# Patient Record
Sex: Male | Born: 1950 | Race: White | Hispanic: No | Marital: Married | Smoking: Former smoker
Health system: Southern US, Community
[De-identification: ages and names within clinical notes are randomized; demographics above are authoritative.]

## PROBLEM LIST (undated history)

## (undated) DIAGNOSIS — H269 Unspecified cataract: Secondary | ICD-10-CM

## (undated) DIAGNOSIS — Z8042 Family history of malignant neoplasm of prostate: Secondary | ICD-10-CM

## (undated) DIAGNOSIS — E119 Type 2 diabetes mellitus without complications: Secondary | ICD-10-CM

## (undated) DIAGNOSIS — I340 Nonrheumatic mitral (valve) insufficiency: Secondary | ICD-10-CM

## (undated) DIAGNOSIS — Z86010 Personal history of colonic polyps: Secondary | ICD-10-CM

## (undated) DIAGNOSIS — I739 Peripheral vascular disease, unspecified: Secondary | ICD-10-CM

## (undated) DIAGNOSIS — C61 Malignant neoplasm of prostate: Secondary | ICD-10-CM

## (undated) DIAGNOSIS — E785 Hyperlipidemia, unspecified: Secondary | ICD-10-CM

## (undated) DIAGNOSIS — I639 Cerebral infarction, unspecified: Secondary | ICD-10-CM

## (undated) DIAGNOSIS — G4730 Sleep apnea, unspecified: Secondary | ICD-10-CM

## (undated) DIAGNOSIS — Z9989 Dependence on other enabling machines and devices: Secondary | ICD-10-CM

## (undated) DIAGNOSIS — G4733 Obstructive sleep apnea (adult) (pediatric): Secondary | ICD-10-CM

## (undated) DIAGNOSIS — I10 Essential (primary) hypertension: Secondary | ICD-10-CM

## (undated) DIAGNOSIS — I2510 Atherosclerotic heart disease of native coronary artery without angina pectoris: Secondary | ICD-10-CM

## (undated) HISTORY — DX: Family history of malignant neoplasm of prostate: Z80.42

## (undated) HISTORY — DX: Hyperlipidemia, unspecified: E78.5

## (undated) HISTORY — PX: PROSTATE BIOPSY: SHX241

## (undated) HISTORY — DX: Nonrheumatic mitral (valve) insufficiency: I34.0

## (undated) HISTORY — PX: BACK SURGERY: SHX140

## (undated) HISTORY — PX: CARDIAC CATHETERIZATION: SHX172

## (undated) HISTORY — DX: Essential (primary) hypertension: I10

## (undated) HISTORY — DX: Unspecified cataract: H26.9

## (undated) HISTORY — DX: Cerebral infarction, unspecified: I63.9

## (undated) HISTORY — DX: Malignant neoplasm of prostate: C61

## (undated) HISTORY — DX: Sleep apnea, unspecified: G47.30

## (undated) HISTORY — DX: Personal history of colonic polyps: Z86.010

---

## 1998-08-13 DIAGNOSIS — I10 Essential (primary) hypertension: Secondary | ICD-10-CM

## 1998-08-13 HISTORY — DX: Essential (primary) hypertension: I10

## 2006-01-04 ENCOUNTER — Ambulatory Visit: Payer: Self-pay | Admitting: Family Medicine

## 2006-02-04 ENCOUNTER — Ambulatory Visit: Payer: Self-pay | Admitting: Family Medicine

## 2006-02-04 LAB — CONVERTED CEMR LAB
Blood Glucose, Fasting: 293 mg/dL
Hgb A1c MFr Bld: 10.7 %
Microalbumin U total vol: 52.2 mg/L
PSA: 17.49 ng/mL
TSH: 1.47 microintl units/mL

## 2006-02-07 ENCOUNTER — Ambulatory Visit: Payer: Self-pay | Admitting: Family Medicine

## 2006-03-29 ENCOUNTER — Ambulatory Visit: Payer: Self-pay | Admitting: Family Medicine

## 2006-04-30 ENCOUNTER — Ambulatory Visit: Payer: Self-pay | Admitting: Family Medicine

## 2006-04-30 ENCOUNTER — Ambulatory Visit: Payer: Self-pay | Admitting: Urology

## 2006-05-01 ENCOUNTER — Ambulatory Visit: Payer: Self-pay | Admitting: Urology

## 2006-06-25 ENCOUNTER — Ambulatory Visit: Payer: Self-pay | Admitting: Urology

## 2006-06-25 ENCOUNTER — Other Ambulatory Visit: Payer: Self-pay

## 2006-07-20 HISTORY — PX: OTHER SURGICAL HISTORY: SHX169

## 2006-09-19 ENCOUNTER — Ambulatory Visit: Payer: Self-pay | Admitting: Family Medicine

## 2006-09-19 LAB — CONVERTED CEMR LAB: Hgb A1c MFr Bld: 8.2 % — ABNORMAL HIGH (ref 4.6–6.0)

## 2006-09-20 ENCOUNTER — Encounter: Payer: Self-pay | Admitting: Family Medicine

## 2006-09-20 LAB — CONVERTED CEMR LAB: Hgb A1c MFr Bld: 8.2 %

## 2006-09-23 ENCOUNTER — Ambulatory Visit: Payer: Self-pay | Admitting: Family Medicine

## 2006-09-30 ENCOUNTER — Ambulatory Visit: Payer: Self-pay | Admitting: Family Medicine

## 2006-10-08 ENCOUNTER — Ambulatory Visit: Payer: Self-pay | Admitting: Family Medicine

## 2006-10-12 ENCOUNTER — Ambulatory Visit: Payer: Self-pay | Admitting: Family Medicine

## 2006-11-07 ENCOUNTER — Ambulatory Visit: Payer: Self-pay | Admitting: Family Medicine

## 2006-11-12 ENCOUNTER — Ambulatory Visit: Payer: Self-pay | Admitting: Family Medicine

## 2006-12-05 ENCOUNTER — Ambulatory Visit: Payer: Self-pay | Admitting: Family Medicine

## 2006-12-27 ENCOUNTER — Telehealth (INDEPENDENT_AMBULATORY_CARE_PROVIDER_SITE_OTHER): Payer: Self-pay | Admitting: *Deleted

## 2007-02-06 ENCOUNTER — Ambulatory Visit: Payer: Self-pay | Admitting: Urology

## 2007-03-13 ENCOUNTER — Encounter (HOSPITAL_COMMUNITY): Admission: RE | Admit: 2007-03-13 | Discharge: 2007-05-07 | Payer: Self-pay | Admitting: Urology

## 2007-04-29 ENCOUNTER — Encounter: Payer: Self-pay | Admitting: Family Medicine

## 2007-05-19 ENCOUNTER — Encounter: Payer: Self-pay | Admitting: Family Medicine

## 2007-07-14 ENCOUNTER — Encounter: Payer: Self-pay | Admitting: Family Medicine

## 2007-07-17 ENCOUNTER — Encounter: Payer: Self-pay | Admitting: Family Medicine

## 2007-09-04 ENCOUNTER — Encounter: Payer: Self-pay | Admitting: Family Medicine

## 2007-09-16 ENCOUNTER — Encounter: Payer: Self-pay | Admitting: Family Medicine

## 2007-09-16 DIAGNOSIS — C61 Malignant neoplasm of prostate: Secondary | ICD-10-CM | POA: Insufficient documentation

## 2007-09-16 DIAGNOSIS — F17200 Nicotine dependence, unspecified, uncomplicated: Secondary | ICD-10-CM | POA: Insufficient documentation

## 2007-10-22 ENCOUNTER — Encounter: Payer: Self-pay | Admitting: Family Medicine

## 2007-12-15 ENCOUNTER — Encounter: Payer: Self-pay | Admitting: Family Medicine

## 2008-04-13 ENCOUNTER — Encounter: Payer: Self-pay | Admitting: Family Medicine

## 2008-05-28 ENCOUNTER — Encounter: Payer: Self-pay | Admitting: Family Medicine

## 2008-06-18 ENCOUNTER — Encounter: Payer: Self-pay | Admitting: Family Medicine

## 2008-06-21 ENCOUNTER — Encounter: Payer: Self-pay | Admitting: Family Medicine

## 2008-06-21 HISTORY — PX: OTHER SURGICAL HISTORY: SHX169

## 2008-06-29 ENCOUNTER — Encounter: Payer: Self-pay | Admitting: Family Medicine

## 2008-06-29 DIAGNOSIS — I10 Essential (primary) hypertension: Secondary | ICD-10-CM | POA: Insufficient documentation

## 2008-06-29 DIAGNOSIS — Z87891 Personal history of nicotine dependence: Secondary | ICD-10-CM | POA: Insufficient documentation

## 2008-08-10 ENCOUNTER — Ambulatory Visit: Payer: Self-pay | Admitting: Urology

## 2008-08-17 ENCOUNTER — Ambulatory Visit: Payer: Self-pay | Admitting: Urology

## 2008-09-01 ENCOUNTER — Encounter: Payer: Self-pay | Admitting: Family Medicine

## 2008-12-28 ENCOUNTER — Encounter: Payer: Self-pay | Admitting: Family Medicine

## 2009-01-17 ENCOUNTER — Encounter: Payer: Self-pay | Admitting: Family Medicine

## 2009-05-12 ENCOUNTER — Ambulatory Visit: Payer: Self-pay | Admitting: Family Medicine

## 2009-05-12 DIAGNOSIS — E669 Obesity, unspecified: Secondary | ICD-10-CM | POA: Insufficient documentation

## 2009-06-15 ENCOUNTER — Encounter: Payer: Self-pay | Admitting: Family Medicine

## 2009-06-28 ENCOUNTER — Encounter: Payer: Self-pay | Admitting: Family Medicine

## 2009-06-29 ENCOUNTER — Encounter: Payer: Self-pay | Admitting: Family Medicine

## 2009-07-18 ENCOUNTER — Encounter: Payer: Self-pay | Admitting: Family Medicine

## 2009-07-18 DIAGNOSIS — I679 Cerebrovascular disease, unspecified: Secondary | ICD-10-CM | POA: Insufficient documentation

## 2009-07-20 ENCOUNTER — Encounter: Admission: RE | Admit: 2009-07-20 | Discharge: 2009-07-20 | Payer: Self-pay | Admitting: Neurology

## 2009-08-10 ENCOUNTER — Encounter (HOSPITAL_COMMUNITY): Admission: RE | Admit: 2009-08-10 | Discharge: 2009-08-12 | Payer: Self-pay | Admitting: Urology

## 2009-08-26 ENCOUNTER — Encounter: Payer: Self-pay | Admitting: Cardiovascular Disease

## 2009-08-26 ENCOUNTER — Encounter: Payer: Self-pay | Admitting: Family Medicine

## 2009-09-12 ENCOUNTER — Encounter: Payer: Self-pay | Admitting: Cardiovascular Disease

## 2009-09-13 ENCOUNTER — Encounter: Payer: Self-pay | Admitting: Family Medicine

## 2009-09-13 HISTORY — PX: OTHER SURGICAL HISTORY: SHX169

## 2009-09-21 ENCOUNTER — Encounter: Payer: Self-pay | Admitting: Cardiovascular Disease

## 2009-09-21 ENCOUNTER — Encounter: Payer: Self-pay | Admitting: Family Medicine

## 2009-09-26 ENCOUNTER — Encounter: Payer: Self-pay | Admitting: Cardiovascular Disease

## 2009-09-30 ENCOUNTER — Encounter: Admission: RE | Admit: 2009-09-30 | Discharge: 2009-09-30 | Payer: Self-pay | Admitting: Orthopedic Surgery

## 2009-10-03 ENCOUNTER — Ambulatory Visit: Payer: Self-pay | Admitting: Cardiovascular Disease

## 2009-10-03 ENCOUNTER — Encounter: Payer: Self-pay | Admitting: Family Medicine

## 2009-10-03 DIAGNOSIS — I739 Peripheral vascular disease, unspecified: Secondary | ICD-10-CM | POA: Insufficient documentation

## 2009-10-03 DIAGNOSIS — M1990 Unspecified osteoarthritis, unspecified site: Secondary | ICD-10-CM | POA: Insufficient documentation

## 2009-10-10 LAB — CONVERTED CEMR LAB
ALT: 16 units/L (ref 0–53)
AST: 11 units/L (ref 0–37)
Albumin: 4.3 g/dL (ref 3.5–5.2)
Alkaline Phosphatase: 74 units/L (ref 39–117)
BUN: 20 mg/dL (ref 6–23)
CO2: 25 meq/L (ref 19–32)
Calcium: 8.5 mg/dL (ref 8.4–10.5)
Chloride: 99 meq/L (ref 96–112)
Cholesterol: 160 mg/dL (ref 0–200)
Creatinine, Ser: 1 mg/dL (ref 0.40–1.50)
Glucose, Bld: 98 mg/dL (ref 70–99)
HDL: 34 mg/dL — ABNORMAL LOW (ref >39)
LDL Cholesterol: 100 mg/dL — ABNORMAL HIGH (ref 0–99)
Potassium: 4.2 meq/L (ref 3.5–5.3)
Sodium: 137 meq/L (ref 135–145)
Total Bilirubin: 0.9 mg/dL (ref 0.3–1.2)
Total CHOL/HDL Ratio: 4.7
Total Protein: 6.6 g/dL (ref 6.0–8.3)
Triglycerides: 129 mg/dL (ref <150)
VLDL: 26 mg/dL (ref 0–40)

## 2009-10-26 ENCOUNTER — Encounter: Payer: Self-pay | Admitting: Family Medicine

## 2009-12-14 ENCOUNTER — Encounter: Payer: Self-pay | Admitting: Family Medicine

## 2010-02-06 ENCOUNTER — Encounter: Payer: Self-pay | Admitting: Family Medicine

## 2010-03-15 ENCOUNTER — Encounter (INDEPENDENT_AMBULATORY_CARE_PROVIDER_SITE_OTHER): Payer: Self-pay | Admitting: *Deleted

## 2010-04-12 ENCOUNTER — Encounter: Payer: Self-pay | Admitting: Family Medicine

## 2010-04-18 ENCOUNTER — Encounter: Payer: Self-pay | Admitting: Family Medicine

## 2010-05-25 ENCOUNTER — Ambulatory Visit: Payer: Self-pay | Admitting: Urology

## 2010-06-01 ENCOUNTER — Encounter: Payer: Self-pay | Admitting: Family Medicine

## 2010-06-16 ENCOUNTER — Encounter: Payer: Self-pay | Admitting: Family Medicine

## 2010-07-05 ENCOUNTER — Encounter: Payer: Self-pay | Admitting: Family Medicine

## 2010-07-27 ENCOUNTER — Encounter: Payer: Self-pay | Admitting: Family Medicine

## 2010-08-13 LAB — HM DIABETES EYE EXAM

## 2010-08-30 ENCOUNTER — Encounter: Payer: Self-pay | Admitting: Family Medicine

## 2010-09-12 NOTE — Letter (Signed)
Summary: Medical Record Release  Medical Record Release   Imported By: Harlon Flor 09/28/2009 14:19:53  _____________________________________________________________________  External Attachment:    Type:   Image     Comment:   External Document

## 2010-09-12 NOTE — Letter (Signed)
Summary: Dr.Timothy Gollan,[facility],Note  Dr.Timothy Gollan,[facility],Note   Imported By: Beau Fanny 08/29/2009 15:53:28  _____________________________________________________________________  External Attachment:    Type:   Image     Comment:   External Document

## 2010-09-12 NOTE — Letter (Signed)
Summary: Dr.[facility],Note  Dr.[facility] Endocrinology,Note   Imported By: Beau Fanny 12/20/2009 15:19:24  _____________________________________________________________________  External Attachment:    Type:   Image     Comment:   External Document

## 2010-09-12 NOTE — Assessment & Plan Note (Signed)
Summary: NP6/AMD   Visit Type:  New Patient Primary Provider:  Shaune Leeks MD  CC:  abnormal stress test and no complaints.  History of Present Illness: [name] is a very pleasant 60 year old gentleman, patient of Dr. Hetty Ely and Dr. Achilles Dunk, history of prostate cancer with resection, diabetes, smoking history and continues to smoke 3-4 cigarettes per day, DJD, hypertension and obesity who presents for followup after his stress test. Patient has moderate calcifications in his aorta and iliac vessels.  Overall he has been doing well with no complaints on the Crestor and lisinopril HCT.he exercises at the gym 2 times per week on a treadmill with physical therapy and place racquetball 2x per week. He denies any significant symptoms of chest pain with exertion. He does have some shortness of breath. He has lost 6 pounds since his last clinic visit one month ago.  Stress test was a Bruce protocol, exercising for 8 minutes, achieved 9 mets at peak heart rate 144 which was 89% of predicted. Peak blood pressure 183/87. He was very fatigued with exertion. There was mild ischemia in the mid anterior to apical inferior region, ejection fraction 50%, no EKG changes, overall is felt to be a low-risk scan.   EKG during the stress test showed normal sinus rhythm with left axis deviation, no significant ST or T wave changes. Rate of 89 beats per minute  Per his report, he had a carotid ultrasound by Dr. love in [place] that showed no significant disease.  Preventive Screening-Counseling & Management  Alcohol-Tobacco     Alcohol drinks/day: 0     Smoking Status: current     Packs/Day: 3 cig  Caffeine-Diet-Exercise     Caffeine use/day: 5 sodas     Does Patient Exercise: yes  Current Problems (verified): 1)  Degenerative Joint Disease  (ICD-715.90) 2)  Exogenous Obesity  (ICD-278.00) 3)  Numbness, L Hand  (ICD-782.0) 4)  Prostate Specific Antigen, Elevated  (ICD-790.93) 5)  Neoplasm,  Malignant, Prostate  (ICD-185) 6)  Nicotine Addiction  (ICD-305.1) 7)  Hypercholesterolemia  (ICD-272.0) 8)  Hypertension  (ICD-401.9) 9)  Diabetes Mellitus, Type II  (ICD-250.00)  Current Medications (verified): 1)  Ascensia Contour Test  Strp (Glucose Blood) .... Use Daily As Ordered   Code: 250.00 2)  Ulticare Short Pen Needles 31g X 8 Mm Misc (Insulin Pen Needle) .... Use As Directed Code: 250.00 3)  Actos 30 Mg Tabs (Pioglitazone Hcl) .... Take One By Mouth Daily 4)  Lantus 100 Unit/ml Soln (Insulin Glargine) .... Inject 130 Units Subcutaneously Daily 5)  Crestor 5 Mg Tabs (Rosuvastatin Calcium) .... Take One Tablet By Mouth Daily. 6)  Aspirin 81 Mg Tbec (Aspirin) .... Take One Tablet By Mouth Daily 7)  Vesicare 5 Mg Tabs (Solifenacin Succinate) .Marland Kitchen.. 1 By Mouth Once Daily 8)  Lisinopril-Hydrochlorothiazide 20-12.5 Mg Tabs (Lisinopril-Hydrochlorothiazide) .Marland Kitchen.. 1 By Mouth Once Daily  Allergies (verified): 1)  ! * Statins 2)  ! * Bp Medications  Past History:  Past Medical History: Last updated: 09/16/2007 Diabetes mellitus, type II:(2000) Hypertension:(2000)  Past Surgical History: Last updated: 09/13/2009 PROSTATE BIOPSY:(04/03/2006) HIFU-- HIGH INTENSE FOCUSED ULTRASOUND DONE IN--------- BY DR COPE:(07/20/2006) PROSTATE BIOPSY:(02/25/2007) CYSTOSCOPY PROSTATIC STONE O/W NML (DR COPE) (06/21/2008) ETT MYOVIEW LOW RISK  EF 49% (09/13/2009)  Family History: Last updated: 09/16/2007 Father::ALIVE AT 87 YOA-+HTN, +CAD, CABG WITH MI AROUND 1997  Mother: ALIVE AT 87 YOA +DM; DIAGNOSED AT 33 YOA ;+CAD; +HTN; CABG WITH MI IN 2005 Siblings: 1 BROTHER OLDER 72 YOA :MI/PTCA  1 SISTER OLDER 58 YOA// 1 SISTER OLDER 56 YOA +DM CV: +FATHER/ MOTHER/ BROTHER/ MGF DECEASED AT 68 YOA OF MI HBP: + FATHER/ +MOTHER/ + SELF DM: + MOTHER/ + SISTER/ + SELF GOUT/ARTHRITIS:  PROSTATE CANCER: + PGF DECEASED AT 88 YOA// + SELF BREAST/OVARIAN/UTERINE CANCER: NEGATIVE COLON CANCER:  NEGATIVE DEPRESSION: NEGATIVE ETOH/DRUG ABUSE: NEGATIVE OTHER: NEGATIVE STROKE  Social History: Last updated: 09/16/2007 Marital Status: Married LIVES WITH WIFE Children: 3 CHILDREN 1 BOY // 2 GIRLS 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  Risk Factors: Alcohol Use: 0 (10/03/2009) Caffeine Use: 5 sodas (10/03/2009) Exercise: yes (10/03/2009)  Risk Factors: Smoking Status: current (10/03/2009) Packs/Day: 3 cig (10/03/2009)  Social History: Alcohol drinks/day:  0 Packs/Day:  3 cig Caffeine use/day:  5 sodas Does Patient Exercise:  yes  Review of Systems  The patient denies anorexia, fever, weight loss, weight gain, vision loss, decreased hearing, hoarseness, chest pain, syncope, dyspnea on exertion, peripheral edema, prolonged cough, headaches, hemoptysis, abdominal pain, melena, hematochezia, severe indigestion/heartburn, hematuria, incontinence, genital sores, muscle weakness, suspicious skin lesions, transient blindness, difficulty walking, depression, unusual weight change, abnormal bleeding, enlarged lymph nodes, angioedema, breast masses, and testicular masses.    Vital Signs:  Patient profile:   60 year old male Height:      69.5 inches Weight:      293.50 pounds BMI:     42.88 Pulse rate:   100 / minute Pulse rhythm:   regular BP sitting:   110 / 72  (right arm) Cuff size:   large  Vitals Entered By: Mercer Pod (October 03, 2009 10:41 AM)  Physical Exam  General:  well-appearing middle-aged gentleman in no apparent distress, alert and oriented x3, HEENT exam is benign, oropharynx is clear, neck is supple with no JVP or carotid bruits, heart sounds are regular with no S1-S2 and no murmurs appreciated, lungs are clear to auscultation with no wheezes or rales, abdominal exam is notable for moderate obesity, no significant lower extremity edema, neurologic exam is grossly nonfocal, skin is warm and dry. Pulses are equal and symmetrical in  his upper and lower extremities.   Impression & Recommendations:  Problem # 1:  DYSPNEA (ICD-786.05) I believe his shortness of breath that he has on exertion is due to his obesity and some mild deconditioning.his stress test only showed mild abnormalities and was low risk. I have suggested to him that if he has worsening chest pressure or symptoms, that he call me for further evaluation. Otherwise I would encourage him to continue to work out at the gym as frequently as possible and to continue the trend of his weight in a downward fashion.He is down 6 pounds over the past month..  His updated medication list for this problem includes:    Aspirin 81 Mg Tbec (Aspirin) .Marland Kitchen... Take one tablet by mouth daily    Lisinopril-hydrochlorothiazide 20-12.5 Mg Tabs (Lisinopril-hydrochlorothiazide) .Marland Kitchen... 1 by mouth once daily  Problem # 2:  HYPERTENSION (ICD-401.9) blood pressure is well controlled today and he has had no problems on lisinopril HCT. We'll continue this medication as he seemed to tolerate well. The trace lower extremity edema seems to have resolved.  His updated medication list for this problem includes:    Aspirin 81 Mg Tbec (Aspirin) .Marland Kitchen... Take one tablet by mouth daily    Lisinopril-hydrochlorothiazide 20-12.5 Mg Tabs (Lisinopril-hydrochlorothiazide) .Marland Kitchen... 1 by mouth once daily  Problem # 3:  HYPERCHOLESTEROLEMIA (ICD-272.0) We'll check his cholesterol today as he has been on Crestor for  2-3 months. We'll forward this to Dr. Hetty Ely is as it becomes available.  His updated medication list for this problem includes:    Crestor 5 Mg Tabs (Rosuvastatin calcium) .Marland Kitchen... Take one tablet by mouth daily.  Orders: T-Comprehensive Metabolic Panel [phone]) T-Lipid Profile ([id number])  Problem # 4:  PVD (ICD-443.9) PVD noted on CT scan in his aorta and iliacs. We have recommended aggressive medical management of his cholesterol and diabetes. He states his diabetes is relatively well  controlled.  Patient Instructions: 1)  Your physician recommends that you schedule a follow-up appointment in: 1 year 2)  Your physician recommends that you continue on your current medications as directed. Please refer to the Current Medication list given to you today.

## 2010-09-12 NOTE — Letter (Signed)
Summary: [facility] / EVAL PROSTATE CANCER / DR. Arlys John COPE  [facility] / EVAL PROSTATE CANCER / DR. Arlys John COPE   Imported By: Carin Primrose 09/02/2008 14:38:03  _____________________________________________________________________  External Attachment:    Type:   Image     Comment:   External Document

## 2010-09-12 NOTE — Letter (Signed)
Summary: [facility]  [facility]   Imported By: Lanelle Bal 06/12/2010 11:16:28  _____________________________________________________________________  External Attachment:    Type:   Image     Comment:   External Document

## 2010-09-12 NOTE — Letter (Signed)
Summary: Phillip Clements,GI,Note   Imported By: Beau Fanny 10/20/2009 09:35:07  _____________________________________________________________________  External Attachment:    Type:   Image     Comment:   External Document

## 2010-09-12 NOTE — Letter (Signed)
Summary: [facility] Endocrinology  [facility] Endocrinology   Imported By: Lanelle Bal 04/24/2010 11:19:24  _____________________________________________________________________  External Attachment:    Type:   Image     Comment:   External Document  Appended Document: [facility] Endocrinology    Clinical Lists Changes  Observations: Added new observation of PAST MED HX: Diabetes mellitus, type II:(2000) Hypertension:(2000) HLD- lipids and DM per [facility] endo (04/24/2010 22:20)         Past History:  Past Medical History: Diabetes mellitus, type II:(2000) Hypertension:(2000) HLD- lipids and DM per [facility] endo

## 2010-09-12 NOTE — Consult Note (Signed)
Summary: Dr.Jakel Love,[facility]Note  Dr.Clent Love,[facility]Note   Imported By: Beau Fanny 10/27/2009 11:33:03  _____________________________________________________________________  External Attachment:    Type:   Image     Comment:   External Document

## 2010-09-12 NOTE — Letter (Signed)
Summary: Medical Record Release  Medical Record Release   Imported By: Harlon Flor 10/10/2009 08:26:37  _____________________________________________________________________  External Attachment:    Type:   Image     Comment:   External Document

## 2010-09-12 NOTE — Letter (Signed)
Summary: Nadara Eaton letter  [place] [facility]  [address] [place], [place] [address]   Phone: [phone]  Fax: [phone]       03/15/2010 MRN: [id number]  SHADRACK BRUMMITT [address] [place], [place]  [address]  Dear Mr. [facility] [place], and [place] announce the retirement of Arta Silence, M.D., from full-time practice at the [facility] office effective February 09, 2010 and his plans of returning part-time.  It is important to Dr. Hetty Ely and to our practice that you understand that [facility] has seven physicians in our office for your health care needs.  We will continue to offer the same exceptional care that you have today.    Dr. Hetty Ely has spoken to many of you about his plans for retirement and returning part-time in the fall.   We will continue to work with you through the transition to schedule appointments for you in the office and meet the high standards that [place] is committed to.   Again, it is with great pleasure that we share the news that Dr. Hetty Ely will return to [facility] at [facility] in October of 2011 with a reduced schedule.    If you have any questions, or would like to request an appointment with one of our physicians, please call us at [phone] and press the option for Scheduling an appointment.  We take pleasure in providing you with excellent patient care and look forward to seeing you at your next office visit.  Our [facility] Physicians are:  Tillman Abide, M.D. Laurita Quint, M.D. Roxy Manns, M.D. Kerby Nora, M.D. Hannah Beat, M.D. Ruthe Mannan, M.D. We proudly welcomed Raechel Ache, M.D. and Eustaquio Boyden, M.D. to the practice in July/August 2011.  Sincerely,  [facility]

## 2010-09-12 NOTE — Letter (Signed)
Summary: Dr.Brian Cope,[facility],Note  Dr.Brian Cope,[facility],Note   Imported By: Beau Fanny 02/06/2010 11:16:11  _____________________________________________________________________  External Attachment:    Type:   Image     Comment:   External Document

## 2010-09-12 NOTE — Letter (Signed)
Summary: Dr.Luisdavid Love,[facility],Note  Dr.Ilay Love,[facility],Note   Imported By: Beau Fanny 06/29/2009 09:19:52  _____________________________________________________________________  External Attachment:    Type:   Image     Comment:   External Document

## 2010-09-12 NOTE — Consult Note (Signed)
Summary: [facility]  [facility]   Imported By: Lanelle Bal 06/26/2010 11:10:15  _____________________________________________________________________  External Attachment:    Type:   Image     Comment:   External Document

## 2010-09-12 NOTE — Letter (Signed)
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 Sandria Manly

## 2010-09-12 NOTE — Progress Notes (Signed)
Summary: PHI  PHI   Imported By: Harlon Flor 10/05/2009 10:44:06  _____________________________________________________________________  External Attachment:    Type:   Image     Comment:   External Document

## 2010-09-14 NOTE — Letter (Signed)
Summary: [facility]  [facility]   Imported By: Maryln Gottron 07/24/2010 15:04:54  _____________________________________________________________________  External Attachment:    Type:   Image     Comment:   External Document

## 2010-09-14 NOTE — Letter (Signed)
Summary: [facility]  [facility]   Imported By: Lanelle Bal 08/08/2010 13:15:29  _____________________________________________________________________  External Attachment:    Type:   Image     Comment:   External Document

## 2010-09-14 NOTE — Letter (Signed)
Summary: Dr.Brian Cope,[facility],Note  Dr.Brian Cope,[facility],Note   Imported By: Beau Fanny 08/31/2010 15:33:39  _____________________________________________________________________  External Attachment:    Type:   Image     Comment:   External Document

## 2010-09-25 ENCOUNTER — Other Ambulatory Visit: Payer: Self-pay | Admitting: Neurology

## 2010-09-25 DIAGNOSIS — M5412 Radiculopathy, cervical region: Secondary | ICD-10-CM

## 2010-09-29 ENCOUNTER — Ambulatory Visit
Admission: RE | Admit: 2010-09-29 | Discharge: 2010-09-29 | Disposition: A | Discharge status: Home / Self Care | Payer: BC Managed Care – PPO | Source: Ambulatory Visit | Attending: Neurology | Admitting: Neurology

## 2010-09-29 DIAGNOSIS — M5412 Radiculopathy, cervical region: Secondary | ICD-10-CM

## 2011-01-09 ENCOUNTER — Ambulatory Visit: Payer: BC Managed Care – PPO | Attending: Neurology | Admitting: Physical Therapy

## 2011-01-09 DIAGNOSIS — M25549 Pain in joints of unspecified hand: Secondary | ICD-10-CM | POA: Insufficient documentation

## 2011-01-09 DIAGNOSIS — M2569 Stiffness of other specified joint, not elsewhere classified: Secondary | ICD-10-CM | POA: Insufficient documentation

## 2011-01-09 DIAGNOSIS — IMO0001 Reserved for inherently not codable concepts without codable children: Secondary | ICD-10-CM | POA: Insufficient documentation

## 2011-01-09 DIAGNOSIS — R209 Unspecified disturbances of skin sensation: Secondary | ICD-10-CM | POA: Insufficient documentation

## 2011-01-18 ENCOUNTER — Ambulatory Visit: Payer: BC Managed Care – PPO | Admitting: Physical Therapy

## 2011-01-19 ENCOUNTER — Ambulatory Visit: Payer: BC Managed Care – PPO | Admitting: Physical Therapy

## 2011-02-07 ENCOUNTER — Ambulatory Visit: Payer: BC Managed Care – PPO | Attending: Neurology | Admitting: Physical Therapy

## 2011-02-07 DIAGNOSIS — R209 Unspecified disturbances of skin sensation: Secondary | ICD-10-CM | POA: Insufficient documentation

## 2011-02-07 DIAGNOSIS — M25549 Pain in joints of unspecified hand: Secondary | ICD-10-CM | POA: Insufficient documentation

## 2011-02-07 DIAGNOSIS — IMO0001 Reserved for inherently not codable concepts without codable children: Secondary | ICD-10-CM | POA: Insufficient documentation

## 2011-02-07 DIAGNOSIS — M2569 Stiffness of other specified joint, not elsewhere classified: Secondary | ICD-10-CM | POA: Insufficient documentation

## 2011-02-27 ENCOUNTER — Encounter: Payer: Self-pay | Admitting: Cardiovascular Disease

## 2011-04-03 ENCOUNTER — Ambulatory Visit: Payer: Self-pay | Admitting: Otolaryngology

## 2011-04-25 ENCOUNTER — Ambulatory Visit: Payer: Self-pay | Admitting: Ophthalmology

## 2011-07-30 DIAGNOSIS — H524 Presbyopia: Secondary | ICD-10-CM | POA: Insufficient documentation

## 2011-07-30 DIAGNOSIS — M1990 Unspecified osteoarthritis, unspecified site: Secondary | ICD-10-CM | POA: Insufficient documentation

## 2011-07-30 DIAGNOSIS — R030 Elevated blood-pressure reading, without diagnosis of hypertension: Secondary | ICD-10-CM | POA: Insufficient documentation

## 2011-07-30 DIAGNOSIS — E7800 Pure hypercholesterolemia, unspecified: Secondary | ICD-10-CM | POA: Insufficient documentation

## 2011-07-30 DIAGNOSIS — I639 Cerebral infarction, unspecified: Secondary | ICD-10-CM | POA: Insufficient documentation

## 2011-08-24 ENCOUNTER — Other Ambulatory Visit: Payer: Self-pay | Admitting: *Deleted

## 2011-08-24 DIAGNOSIS — E119 Type 2 diabetes mellitus without complications: Secondary | ICD-10-CM

## 2011-08-24 NOTE — Telephone Encounter (Signed)
Patient has not been seen in this clinic in quite some time.  ? 1 RF ?

## 2011-08-26 MED ORDER — INSULIN PEN NEEDLE 31G X 8 MM MISC: Status: DC

## 2011-08-26 NOTE — Telephone Encounter (Signed)
Please call in 30 day supply, have pt schedule OV.  Thanks.  Will need labs ahead of time.

## 2011-08-27 ENCOUNTER — Other Ambulatory Visit: Payer: Self-pay | Admitting: *Deleted

## 2011-08-27 MED ORDER — INSULIN PEN NEEDLE 31G X 8 MM MISC: Status: DC

## 2011-08-27 NOTE — Telephone Encounter (Signed)
Refill sent electronically

## 2011-11-08 ENCOUNTER — Encounter: Payer: Self-pay | Admitting: Family Medicine

## 2011-12-03 DIAGNOSIS — R202 Paresthesia of skin: Secondary | ICD-10-CM | POA: Insufficient documentation

## 2012-04-11 ENCOUNTER — Other Ambulatory Visit: Payer: Self-pay | Admitting: *Deleted

## 2012-04-11 NOTE — Telephone Encounter (Signed)
Faxed refill request.  Please see last note on RF.  No upcoming appts scheduled.

## 2012-04-14 NOTE — Telephone Encounter (Signed)
Deny this.  He needs to schedule OV with labs ahead of time.

## 2012-04-15 NOTE — Telephone Encounter (Signed)
Patient notified by telephone. Was advised by patient that he will have to call back and schedule these appointments.

## 2012-04-16 ENCOUNTER — Other Ambulatory Visit: Payer: Self-pay | Admitting: *Deleted

## 2012-05-13 DIAGNOSIS — N319 Neuromuscular dysfunction of bladder, unspecified: Secondary | ICD-10-CM | POA: Insufficient documentation

## 2012-05-13 DIAGNOSIS — C7951 Secondary malignant neoplasm of bone: Secondary | ICD-10-CM | POA: Insufficient documentation

## 2012-05-13 DIAGNOSIS — C61 Malignant neoplasm of prostate: Secondary | ICD-10-CM | POA: Insufficient documentation

## 2012-05-13 DIAGNOSIS — N529 Male erectile dysfunction, unspecified: Secondary | ICD-10-CM | POA: Insufficient documentation

## 2012-05-13 DIAGNOSIS — R339 Retention of urine, unspecified: Secondary | ICD-10-CM | POA: Insufficient documentation

## 2012-05-13 DIAGNOSIS — N390 Urinary tract infection, site not specified: Secondary | ICD-10-CM | POA: Insufficient documentation

## 2012-05-20 DIAGNOSIS — N393 Stress incontinence (female) (male): Secondary | ICD-10-CM | POA: Insufficient documentation

## 2012-05-25 ENCOUNTER — Encounter: Payer: Self-pay | Admitting: Family Medicine

## 2012-06-13 ENCOUNTER — Other Ambulatory Visit: Payer: Self-pay | Admitting: *Deleted

## 2012-07-29 ENCOUNTER — Other Ambulatory Visit: Payer: Self-pay | Admitting: Neurosurgery

## 2012-08-12 ENCOUNTER — Encounter (HOSPITAL_COMMUNITY): Payer: Self-pay | Admitting: Pharmacy Technician

## 2012-08-20 NOTE — Pre-Procedure Instructions (Signed)
20 Phillip Clements  08/20/2012   Your procedure is scheduled on: Monday, January 13th  Report to [facility] at 7:15 AM.  Call this number if you have problems the morning of surgery: [phone]   Remember: Nothing to eat or drink after Midnight.    Take these medicines the morning of surgery with A SIP OF WATER: None    Do not wear jewelry, make-up or nail polish.  Do not wear lotions, powders, or perfumes. You may wear deodorant.  Do not shave 48 hours prior to surgery. Men may shave face and neck.  Do not bring valuables to the hospital.  Contacts, dentures or bridgework may not be worn into surgery.  Leave suitcase in the car. After surgery it may be brought to your room.  For patients admitted to the hospital, checkout time is 11:00 AM the day of discharge.   Patients discharged the day of surgery will not be allowed to drive home.  Name and phone number of your driver: na   Special Instructions: Shower using CHG 2 nights before surgery and the night before surgery.  If you shower the day of surgery use CHG.  Use special wash - you have one bottle of CHG for all showers.  You should use approximately 1/3 of the bottle for each shower.   Please read over the following fact sheets that you were given: Pain Booklet, Coughing and Deep Breathing and Surgical Site Infection Prevention

## 2012-08-21 ENCOUNTER — Encounter (HOSPITAL_COMMUNITY)
Admission: RE | Admit: 2012-08-21 | Discharge: 2012-08-21 | Disposition: A | Discharge status: Home / Self Care | Payer: BC Managed Care – PPO | Source: Ambulatory Visit | Attending: Anesthesiology | Admitting: Anesthesiology

## 2012-08-21 ENCOUNTER — Encounter (HOSPITAL_COMMUNITY)
Admission: RE | Admit: 2012-08-21 | Discharge: 2012-08-21 | Disposition: A | Discharge status: Home / Self Care | Payer: BC Managed Care – PPO | Source: Ambulatory Visit | Attending: Neurosurgery | Admitting: Neurosurgery

## 2012-08-21 ENCOUNTER — Encounter (HOSPITAL_COMMUNITY): Payer: Self-pay

## 2012-08-21 LAB — BASIC METABOLIC PANEL
BUN: 13 mg/dL (ref 6–23)
CO2: 28 mEq/L (ref 19–32)
Calcium: 9.7 mg/dL (ref 8.4–10.5)
Chloride: 97 mEq/L (ref 96–112)
Creatinine, Ser: 0.78 mg/dL (ref 0.50–1.35)
GFR calc Af Amer: >90 mL/min (ref >90)
GFR calc non Af Amer: >90 mL/min (ref >90)
Glucose, Bld: 147 mg/dL — ABNORMAL HIGH (ref 70–99)
Potassium: 4.6 mEq/L (ref 3.5–5.1)
Sodium: 137 mEq/L (ref 135–145)

## 2012-08-21 LAB — CBC
HCT: 45.3 % (ref 39.0–52.0)
Hemoglobin: 15.8 g/dL (ref 13.0–17.0)
MCH: 29.4 pg (ref 26.0–34.0)
MCHC: 34.9 g/dL (ref 30.0–36.0)
MCV: 84.4 fL (ref 78.0–100.0)
Platelets: 245 10*3/uL (ref 150–400)
RBC: 5.37 MIL/uL (ref 4.22–5.81)
RDW: 12.5 % (ref 11.5–15.5)
WBC: 9 10*3/uL (ref 4.0–10.5)

## 2012-08-21 LAB — SURGICAL PCR SCREEN
MRSA, PCR: NEGATIVE
Staphylococcus aureus: POSITIVE — AB

## 2012-08-24 MED ORDER — DEXTROSE 5 % IV SOLN
3.0000 g | INTRAVENOUS | Status: AC
  Administered 2012-08-25: 3 g via INTRAVENOUS
  Filled 2012-08-24: qty 3000

## 2012-08-25 ENCOUNTER — Ambulatory Visit (HOSPITAL_COMMUNITY): Payer: BC Managed Care – PPO | Admitting: Certified Registered Nurse Anesthetist

## 2012-08-25 ENCOUNTER — Encounter (HOSPITAL_COMMUNITY): Payer: Self-pay | Admitting: *Deleted

## 2012-08-25 ENCOUNTER — Ambulatory Visit (HOSPITAL_COMMUNITY)
Admission: RE | Admit: 2012-08-25 | Discharge: 2012-08-26 | Disposition: A | Discharge status: Home / Self Care | Payer: BC Managed Care – PPO | Source: Ambulatory Visit | Attending: Neurosurgery | Admitting: Neurosurgery

## 2012-08-25 ENCOUNTER — Encounter (HOSPITAL_COMMUNITY): Payer: Self-pay | Admitting: Certified Registered Nurse Anesthetist

## 2012-08-25 ENCOUNTER — Ambulatory Visit (HOSPITAL_COMMUNITY): Payer: BC Managed Care – PPO

## 2012-08-25 ENCOUNTER — Encounter (HOSPITAL_COMMUNITY)
Admission: RE | Disposition: A | Discharge status: Home / Self Care | Payer: Self-pay | Source: Ambulatory Visit | Attending: Neurosurgery

## 2012-08-25 DIAGNOSIS — Z01810 Encounter for preprocedural cardiovascular examination: Secondary | ICD-10-CM | POA: Insufficient documentation

## 2012-08-25 DIAGNOSIS — F17200 Nicotine dependence, unspecified, uncomplicated: Secondary | ICD-10-CM | POA: Insufficient documentation

## 2012-08-25 DIAGNOSIS — M47812 Spondylosis without myelopathy or radiculopathy, cervical region: Secondary | ICD-10-CM | POA: Insufficient documentation

## 2012-08-25 DIAGNOSIS — Z01812 Encounter for preprocedural laboratory examination: Secondary | ICD-10-CM | POA: Insufficient documentation

## 2012-08-25 DIAGNOSIS — I10 Essential (primary) hypertension: Secondary | ICD-10-CM | POA: Insufficient documentation

## 2012-08-25 DIAGNOSIS — M5030 Other cervical disc degeneration, unspecified cervical region: Secondary | ICD-10-CM | POA: Insufficient documentation

## 2012-08-25 DIAGNOSIS — M5020 Other cervical disc displacement, unspecified cervical region: Secondary | ICD-10-CM | POA: Insufficient documentation

## 2012-08-25 DIAGNOSIS — Z01818 Encounter for other preprocedural examination: Secondary | ICD-10-CM | POA: Insufficient documentation

## 2012-08-25 DIAGNOSIS — E119 Type 2 diabetes mellitus without complications: Secondary | ICD-10-CM | POA: Insufficient documentation

## 2012-08-25 HISTORY — PX: ANTERIOR CERVICAL DECOMP/DISCECTOMY FUSION: SHX1161

## 2012-08-25 LAB — GLUCOSE, CAPILLARY
Glucose-Capillary: 150 mg/dL — ABNORMAL HIGH (ref 70–99)
Glucose-Capillary: 96 mg/dL (ref 70–99)
Glucose-Capillary: 114 mg/dL — ABNORMAL HIGH (ref 70–99)
Glucose-Capillary: 223 mg/dL — ABNORMAL HIGH (ref 70–99)

## 2012-08-25 SURGERY — ANTERIOR CERVICAL DECOMPRESSION/DISCECTOMY FUSION 2 LEVELS
Anesthesia: General | Site: Spine Cervical | Wound class: Clean

## 2012-08-25 MED ORDER — PROPOFOL 10 MG/ML IV BOLUS
INTRAVENOUS | Status: DC | PRN
  Administered 2012-08-25: 200 mg via INTRAVENOUS

## 2012-08-25 MED ORDER — MAGNESIUM HYDROXIDE 400 MG/5ML PO SUSP: 30.0000 mL | Freq: Every day | ORAL | Status: DC | PRN

## 2012-08-25 MED ORDER — KETOROLAC TROMETHAMINE 30 MG/ML IJ SOLN
INTRAMUSCULAR | Status: AC
  Filled 2012-08-25: qty 1

## 2012-08-25 MED ORDER — KETOROLAC TROMETHAMINE 30 MG/ML IJ SOLN
30.0000 mg | Freq: Once | INTRAMUSCULAR | Status: DC
  Administered 2012-08-25: 30 mg via INTRAVENOUS

## 2012-08-25 MED ORDER — ACETAMINOPHEN 325 MG PO TABS: 650.0000 mg | ORAL_TABLET | ORAL | Status: DC | PRN

## 2012-08-25 MED ORDER — SODIUM CHLORIDE 0.9 % IJ SOLN: 3.0000 mL | Freq: Two times a day (BID) | INTRAMUSCULAR | Status: DC

## 2012-08-25 MED ORDER — HYDROMORPHONE HCL PF 1 MG/ML IJ SOLN
INTRAMUSCULAR | Status: AC
  Filled 2012-08-25: qty 1

## 2012-08-25 MED ORDER — HYDROXYZINE HCL 50 MG/ML IM SOLN: 50.0000 mg | INTRAMUSCULAR | Status: DC | PRN

## 2012-08-25 MED ORDER — LIDOCAINE HCL (CARDIAC) 20 MG/ML IV SOLN
INTRAVENOUS | Status: DC | PRN
  Administered 2012-08-25: 70 mg via INTRAVENOUS

## 2012-08-25 MED ORDER — INSULIN GLARGINE 100 UNIT/ML ~~LOC~~ SOLN
150.0000 [IU] | Freq: Every day | SUBCUTANEOUS | Status: DC
  Administered 2012-08-25: 150 [IU] via SUBCUTANEOUS

## 2012-08-25 MED ORDER — OXYCODONE HCL 5 MG PO TABS: 5.0000 mg | ORAL_TABLET | Freq: Once | ORAL | Status: DC | PRN

## 2012-08-25 MED ORDER — CYCLOBENZAPRINE HCL 10 MG PO TABS: 10.0000 mg | ORAL_TABLET | Freq: Three times a day (TID) | ORAL | Status: DC | PRN

## 2012-08-25 MED ORDER — OXYCODONE HCL 5 MG PO TABS: 5.0000 mg | ORAL_TABLET | ORAL | Status: DC | PRN

## 2012-08-25 MED ORDER — LIRAGLUTIDE 18 MG/3ML ~~LOC~~ SOLN: Freq: Every day | SUBCUTANEOUS | Status: DC

## 2012-08-25 MED ORDER — BACITRACIN 50000 UNITS IM SOLR
INTRAMUSCULAR | Status: AC
  Filled 2012-08-25: qty 1

## 2012-08-25 MED ORDER — SODIUM CHLORIDE 0.9 % IR SOLN
Status: DC | PRN
  Administered 2012-08-25: 11:00:00

## 2012-08-25 MED ORDER — KETOROLAC TROMETHAMINE 30 MG/ML IJ SOLN
30.0000 mg | Freq: Four times a day (QID) | INTRAMUSCULAR | Status: DC
  Administered 2012-08-25 – 2012-08-26 (×3): 30 mg via INTRAVENOUS
  Filled 2012-08-25 (×7): qty 1

## 2012-08-25 MED ORDER — ZOLPIDEM TARTRATE 5 MG PO TABS: 5.0000 mg | ORAL_TABLET | Freq: Every evening | ORAL | Status: DC | PRN

## 2012-08-25 MED ORDER — MENTHOL 3 MG MT LOZG: 1.0000 | LOZENGE | OROMUCOSAL | Status: DC | PRN

## 2012-08-25 MED ORDER — PHENOL 1.4 % MT LIQD: 1.0000 | OROMUCOSAL | Status: DC | PRN

## 2012-08-25 MED ORDER — DEXTROSE-NACL 5-0.45 % IV SOLN: INTRAVENOUS | Status: DC

## 2012-08-25 MED ORDER — NEOSTIGMINE METHYLSULFATE 1 MG/ML IJ SOLN
INTRAMUSCULAR | Status: DC | PRN
  Administered 2012-08-25: 5 mg via INTRAVENOUS

## 2012-08-25 MED ORDER — EPHEDRINE SULFATE 50 MG/ML IJ SOLN
INTRAMUSCULAR | Status: DC | PRN
  Administered 2012-08-25 (×3): 5 mg via INTRAVENOUS
  Administered 2012-08-25: 15 mg via INTRAVENOUS
  Administered 2012-08-25 (×2): 5 mg via INTRAVENOUS
  Administered 2012-08-25: 10 mg via INTRAVENOUS

## 2012-08-25 MED ORDER — ACETAMINOPHEN 10 MG/ML IV SOLN
1000.0000 mg | Freq: Four times a day (QID) | INTRAVENOUS | Status: DC
  Administered 2012-08-25 – 2012-08-26 (×3): 1000 mg via INTRAVENOUS
  Filled 2012-08-25 (×4): qty 100

## 2012-08-25 MED ORDER — HYDROMORPHONE HCL PF 1 MG/ML IJ SOLN
0.2500 mg | INTRAMUSCULAR | Status: DC | PRN
  Administered 2012-08-25 (×2): 0.5 mg via INTRAVENOUS

## 2012-08-25 MED ORDER — HEMOSTATIC AGENTS (NO CHARGE) OPTIME
TOPICAL | Status: DC | PRN
  Administered 2012-08-25: 1 via TOPICAL

## 2012-08-25 MED ORDER — GLYCOPYRROLATE 0.2 MG/ML IJ SOLN
INTRAMUSCULAR | Status: DC | PRN
  Administered 2012-08-25: .8 mg via INTRAVENOUS

## 2012-08-25 MED ORDER — ROCURONIUM BROMIDE 100 MG/10ML IV SOLN
INTRAVENOUS | Status: DC | PRN
  Administered 2012-08-25: 10 mg via INTRAVENOUS
  Administered 2012-08-25: 20 mg via INTRAVENOUS
  Administered 2012-08-25 (×4): 10 mg via INTRAVENOUS
  Administered 2012-08-25: 50 mg via INTRAVENOUS

## 2012-08-25 MED ORDER — LACTATED RINGERS IV SOLN
INTRAVENOUS | Status: DC | PRN
  Administered 2012-08-25 (×3): via INTRAVENOUS

## 2012-08-25 MED ORDER — ONDANSETRON HCL 4 MG/2ML IJ SOLN: 4.0000 mg | Freq: Four times a day (QID) | INTRAMUSCULAR | Status: DC | PRN

## 2012-08-25 MED ORDER — LIDOCAINE HCL 4 % MT SOLN
OROMUCOSAL | Status: DC | PRN
  Administered 2012-08-25: 3 mL via TOPICAL

## 2012-08-25 MED ORDER — SODIUM CHLORIDE 0.9 % IV SOLN
INTRAVENOUS | Status: AC
  Filled 2012-08-25: qty 500

## 2012-08-25 MED ORDER — LIDOCAINE-EPINEPHRINE 1 %-1:100000 IJ SOLN
INTRAMUSCULAR | Status: DC | PRN
  Administered 2012-08-25: 5 mL

## 2012-08-25 MED ORDER — FENTANYL CITRATE 0.05 MG/ML IJ SOLN
INTRAMUSCULAR | Status: DC | PRN
  Administered 2012-08-25 (×3): 50 ug via INTRAVENOUS
  Administered 2012-08-25 (×2): 100 ug via INTRAVENOUS

## 2012-08-25 MED ORDER — PHENYLEPHRINE HCL 10 MG/ML IJ SOLN
INTRAMUSCULAR | Status: DC | PRN
  Administered 2012-08-25 (×2): 80 ug via INTRAVENOUS

## 2012-08-25 MED ORDER — GLIPIZIDE 5 MG PO TABS
5.0000 mg | ORAL_TABLET | Freq: Every day | ORAL | Status: DC
  Administered 2012-08-25: 5 mg via ORAL
  Filled 2012-08-25 (×2): qty 1

## 2012-08-25 MED ORDER — ACETAMINOPHEN 650 MG RE SUPP: 650.0000 mg | RECTAL | Status: DC | PRN

## 2012-08-25 MED ORDER — HEMOSTATIC AGENTS (NO CHARGE) OPTIME
TOPICAL | Status: DC | PRN
  Administered 2012-08-25 (×3): 1 via TOPICAL

## 2012-08-25 MED ORDER — ACETAMINOPHEN 10 MG/ML IV SOLN
INTRAVENOUS | Status: AC
  Administered 2012-08-25: 1000 mg via INTRAVENOUS
  Filled 2012-08-25: qty 100

## 2012-08-25 MED ORDER — SODIUM CHLORIDE 0.9 % IV SOLN: 250.0000 mL | INTRAVENOUS | Status: DC

## 2012-08-25 MED ORDER — HYDROXYZINE HCL 25 MG PO TABS: 50.0000 mg | ORAL_TABLET | ORAL | Status: DC | PRN

## 2012-08-25 MED ORDER — MIDAZOLAM HCL 5 MG/5ML IJ SOLN
INTRAMUSCULAR | Status: DC | PRN
  Administered 2012-08-25: 2 mg via INTRAVENOUS

## 2012-08-25 MED ORDER — 0.9 % SODIUM CHLORIDE (POUR BTL) OPTIME
TOPICAL | Status: DC | PRN
  Administered 2012-08-25: 1000 mL

## 2012-08-25 MED ORDER — SODIUM CHLORIDE 0.9 % IJ SOLN: 3.0000 mL | INTRAMUSCULAR | Status: DC | PRN

## 2012-08-25 MED ORDER — ALBUMIN HUMAN 5 % IV SOLN
INTRAVENOUS | Status: DC | PRN
  Administered 2012-08-25 (×2): via INTRAVENOUS

## 2012-08-25 MED ORDER — BISACODYL 10 MG RE SUPP: 10.0000 mg | Freq: Every day | RECTAL | Status: DC | PRN

## 2012-08-25 MED ORDER — MORPHINE SULFATE 4 MG/ML IJ SOLN: 4.0000 mg | INTRAMUSCULAR | Status: DC | PRN

## 2012-08-25 MED ORDER — BICALUTAMIDE 50 MG PO TABS
50.0000 mg | ORAL_TABLET | Freq: Every day | ORAL | Status: DC
  Administered 2012-08-25: 50 mg via ORAL
  Filled 2012-08-25 (×2): qty 1

## 2012-08-25 MED ORDER — ALUM & MAG HYDROXIDE-SIMETH 200-200-20 MG/5ML PO SUSP: 30.0000 mL | Freq: Four times a day (QID) | ORAL | Status: DC | PRN

## 2012-08-25 MED ORDER — BUPIVACAINE HCL (PF) 0.25 % IJ SOLN
INTRAMUSCULAR | Status: DC | PRN
  Administered 2012-08-25: 5 mg

## 2012-08-25 MED ORDER — OXYCODONE HCL 5 MG/5ML PO SOLN: 5.0000 mg | Freq: Once | ORAL | Status: DC | PRN

## 2012-08-25 SURGICAL SUPPLY — 50 items
ALLOGRAFT 7X14X11 (Bone Implant) ×2 IMPLANT
BAG DECANTER FOR FLEXI CONT (MISCELLANEOUS) ×2 IMPLANT
BIT DRILL NEURO 2X3.1 SFT TUCH (MISCELLANEOUS) ×2 IMPLANT
BLADE ULTRA TIP 2M (BLADE) ×2 IMPLANT
BRUSH SCRUB EZ PLAIN DRY (MISCELLANEOUS) ×2 IMPLANT
CANISTER SUCTION 2500CC (MISCELLANEOUS) ×2 IMPLANT
CLOTH BEACON ORANGE TIMEOUT ST (SAFETY) ×2 IMPLANT
CONT SPEC 4OZ CLIKSEAL STRL BL (MISCELLANEOUS) ×2 IMPLANT
COVER MAYO STAND STRL (DRAPES) ×2 IMPLANT
DECANTER SPIKE VIAL GLASS SM (MISCELLANEOUS) ×2 IMPLANT
DERMABOND ADVANCED (GAUZE/BANDAGES/DRESSINGS) ×1
DERMABOND ADVANCED .7 DNX12 (GAUZE/BANDAGES/DRESSINGS) ×1 IMPLANT
DRAPE LAPAROTOMY 100X72 PEDS (DRAPES) ×2 IMPLANT
DRAPE MICROSCOPE LEICA (MISCELLANEOUS) ×2 IMPLANT
DRAPE POUCH INSTRU U-SHP 10X18 (DRAPES) ×2 IMPLANT
DRAPE PROXIMA HALF (DRAPES) ×2 IMPLANT
DRILL NEURO 2X3.1 SOFT TOUCH (MISCELLANEOUS) ×4
ELECT COATED BLADE 2.86 ST (ELECTRODE) ×2 IMPLANT
ELECT REM PT RETURN 9FT ADLT (ELECTROSURGICAL) ×2
ELECTRODE REM PT RTRN 9FT ADLT (ELECTROSURGICAL) ×1 IMPLANT
GLOVE BIOGEL PI IND STRL 8 (GLOVE) ×2 IMPLANT
GLOVE BIOGEL PI INDICATOR 8 (GLOVE) ×2
GLOVE ECLIPSE 7.5 STRL STRAW (GLOVE) ×4 IMPLANT
GLOVE EXAM NITRILE LRG STRL (GLOVE) IMPLANT
GLOVE EXAM NITRILE MD LF STRL (GLOVE) IMPLANT
GLOVE EXAM NITRILE XL STR (GLOVE) IMPLANT
GLOVE EXAM NITRILE XS STR PU (GLOVE) IMPLANT
GOWN BRE IMP SLV AUR LG STRL (GOWN DISPOSABLE) ×2 IMPLANT
GOWN BRE IMP SLV AUR XL STRL (GOWN DISPOSABLE) ×10 IMPLANT
GOWN STRL REIN 2XL LVL4 (GOWN DISPOSABLE) IMPLANT
GRAFT CORT CANC 14X8.25X11 5D (Bone Implant) ×2 IMPLANT
HEAD HALTER (SOFTGOODS) ×2 IMPLANT
HEMOSTAT POWDER KIT SURGIFOAM (HEMOSTASIS) ×2 IMPLANT
KIT BASIN OR (CUSTOM PROCEDURE TRAY) ×2 IMPLANT
KIT ROOM TURNOVER OR (KITS) ×2 IMPLANT
NEEDLE HYPO 25X1 1.5 SAFETY (NEEDLE) ×2 IMPLANT
NEEDLE SPNL 22GX3.5 QUINCKE BK (NEEDLE) ×4 IMPLANT
NS IRRIG 1000ML POUR BTL (IV SOLUTION) ×2 IMPLANT
PACK LAMINECTOMY NEURO (CUSTOM PROCEDURE TRAY) ×2 IMPLANT
PAD ARMBOARD 7.5X6 YLW CONV (MISCELLANEOUS) ×6 IMPLANT
RUBBERBAND STERILE (MISCELLANEOUS) ×4 IMPLANT
SPONGE INTESTINAL PEANUT (DISPOSABLE) ×2 IMPLANT
SPONGE SURGIFOAM ABS GEL SZ50 (HEMOSTASIS) ×2 IMPLANT
STAPLER SKIN PROX WIDE 3.9 (STAPLE) IMPLANT
SUT VIC AB 2-0 CP2 18 (SUTURE) ×2 IMPLANT
SUT VIC AB 3-0 SH 8-18 (SUTURE) ×2 IMPLANT
SYR 20ML ECCENTRIC (SYRINGE) ×2 IMPLANT
TOWEL OR 17X24 6PK STRL BLUE (TOWEL DISPOSABLE) ×2 IMPLANT
TOWEL OR 17X26 10 PK STRL BLUE (TOWEL DISPOSABLE) ×2 IMPLANT
WATER STERILE IRR 1000ML POUR (IV SOLUTION) ×2 IMPLANT

## 2012-08-25 NOTE — Anesthesia Preprocedure Evaluation (Addendum)
Anesthesia Evaluation  Patient identified by MRN, date of birth, ID band Patient awake    Reviewed: Allergy & Precautions, H&P , NPO status , Patient's Chart, lab work & pertinent test results  Airway Mallampati: II TM Distance: >3 FB Neck ROM: full    Dental  (+) Dental Advisory Given   Pulmonary shortness of breath and with exertion, Current Smoker,          Cardiovascular hypertension, + Peripheral Vascular Disease     Neuro/Psych CVA, No Residual Symptoms    GI/Hepatic   Endo/Other  diabetes, Type obesity  Renal/GU      Musculoskeletal  (+) Arthritis -,   Abdominal   Peds  Hematology   Anesthesia Other Findings   Reproductive/Obstetrics                          Anesthesia Physical Anesthesia Plan  ASA: III  Anesthesia Plan: General   Post-op Pain Management:    Induction: Intravenous  Airway Management Planned: Oral ETT  Additional Equipment:   Intra-op Plan:   Post-operative Plan: Extubation in OR  Informed Consent: I have reviewed the patients History and Physical, chart, labs and discussed the procedure including the risks, benefits and alternatives for the proposed anesthesia with the patient or authorized representative who has indicated his/her understanding and acceptance.     Plan Discussed with: CRNA, Surgeon and Anesthesiologist  Anesthesia Plan Comments:        Anesthesia Quick Evaluation

## 2012-08-25 NOTE — Progress Notes (Signed)
Filed Vitals:   08/25/12 1528 08/25/12 1600 08/25/12 1727 08/25/12 2005  BP:  168/68 137/73 148/69  Pulse: 67 71 77 73  Temp: 98.9 F (37.2 C) 97.4 F (36.3 C) 97.6 F (36.4 C) 97.8 F (36.6 C)  TempSrc:      Resp: 27 20 18 18   Height:      Weight:      SpO2: 96% 96% 92% 96%    Patient resting in bed. Has been out of bed to the bathroom, and has voided. Wound clean and dry. Encouraged patient to ambulate, and has been and living subsequently with his wife in the halls.  Plan: Continue progress to postoperative recovery.  Hewitt Shorts, MD 08/25/2012, 8:25 PM

## 2012-08-25 NOTE — Transfer of Care (Signed)
Immediate Anesthesia Transfer of Care Note  Patient: Phillip Clements  Procedure(s) Performed: Procedure(s) (LRB) with comments: ANTERIOR CERVICAL DECOMPRESSION/DISCECTOMY FUSION 2 LEVELS (N/A) - Cervical five-six Cervical six-seven anterior cervical decompression with fusion and plating and bonegraft  Patient Location: PACU  Anesthesia Type:General  Level of Consciousness: awake, alert , oriented and patient cooperative  Airway & Oxygen Therapy: Patient Spontanous Breathing and Patient connected to nasal cannula oxygen  Post-op Assessment: Report given to PACU RN, Post -op Vital signs reviewed and stable and Patient moving all extremities X 4  Post vital signs: Reviewed and stable  Complications: No apparent anesthesia complications

## 2012-08-25 NOTE — Preoperative (Signed)
Beta Blockers   Reason not to administer Beta Blockers:Not Applicable 

## 2012-08-25 NOTE — H&P (Signed)
Subjective: Patient is a 62 y.o. male who is admitted for treatment of multilevel cervical disc herniations, spondylosis, and degenerative disease. Patient has had radicular symptoms into the upper extremities for several years. He's undergone extensive nonsurgical management including medications, spinal injections, and physical therapy. He is admitted now for 2 level C5-6 and C6-7 ACDF   Patient Active Problem List   Diagnosis Date Noted  . PVD 10/03/2009  . DEGENERATIVE JOINT DISEASE 10/03/2009  . DYSPNEA 10/03/2009  . 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 NEOPLASM, MALIGNANT, PROSTATE 09/16/2007  . DIABETES MELLITUS, TYPE II 09/16/2007  . HYPERCHOLESTEROLEMIA 09/16/2007  . NICOTINE ADDICTION 09/16/2007  . HYPERTENSION 09/16/2007  . Prostate cancer 09/16/2007   Past Medical History  Diagnosis Date  . Diabetes mellitus 2000    Type II  . Hypertension 2000  . Hyperlipidemia     Lipids and DM per [facility] endo  . Stroke     H/o pontine CVA, prev eval by Dr. Sandria Manly  . Environmental allergies   . Pneumonia     x3 as an adult-  last time 2009  . 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 Arthritis     Past Surgical History  Procedure Date  . Prostate biopsy 04/03/06 & 02/25/07    x2  . High intense focused ultrasound 07/20/06    By Dr. Achilles Dunk  . Cytoscopy prostatic stone o/w nml 06/21/08    Dr. Achilles Dunk  . Ett myoview 09/13/09    Low risk EF 49%    Prescriptions prior to admission  Medication Sig Dispense Refill  . aspirin 81 MG EC tablet Take 81 mg by mouth daily.        . bicalutamide (CASODEX) 50 MG tablet Take 50 mg by mouth daily.      Marland Kitchen glipiZIDE (GLUCOTROL) 5 MG tablet Take 5 mg by mouth daily.      Marland Kitchen glucose blood test strip by Other route. Use daily as ordered. Code: 250.00       . insulin glargine (LANTUS) 100 UNIT/ML injection Inject 150 Units into the skin at bedtime.       . Insulin Pen Needle (ULTICARE SHORT PEN NEEDLES) 31G X 8 MM MISC Use as directed. Code:250.00  30 each   0  . Liraglutide (VICTOZA) 18 MG/3ML SOLN Inject into the skin at bedtime. Administer 1.8 units every night      . Leuprolide Acetate (LUPRON IJ) Inject as directed every 4 (four) months.       Allergies  Allergen Reactions  . Metformin And Related Nausea Only  . Statins     REACTION: JOINT ACHES    History  Substance Use Topics  . Smoking status: Current Some Day Smoker -- 1.0 packs/day for 40 years  . Smokeless tobacco: Not on file  . Alcohol Use: No    Family History  Problem Relation Age of Onset  . Diabetes Mother 34    DM  . Coronary artery disease Mother   . Hypertension Mother   . Heart attack Mother     CABG with MI in 2005  . Heart disease Mother     CV  . Hypertension Father   . Heart attack Father     CABG with Mi around 1997  . Coronary artery disease Father   . Heart disease Father     CV  . Heart attack Brother     MI/PTCA  . Heart disease Brother     CV  .  Heart attack Maternal Grandfather     MI  . Prostate cancer Paternal Grandfather   . Breast cancer Neg Hx     Breast/ovarian/uterine cancer  . Colon cancer Neg Hx   . Depression Neg Hx   . Alcohol abuse Neg Hx     ETOH/drug abuse  . Stroke Neg Hx   . Diabetes Sister     DM     Review of Systems A comprehensive review of systems was negative.  Objective: Vital signs in last 24 hours: Temp:  [97.5 F (36.4 C)] 97.5 F (36.4 C) (01/13 0734) Pulse Rate:  [83] 83  (01/13 0734) Resp:  [18] 18  (01/13 0734) BP: (144)/(78) 144/78 mmHg (01/13 0734) SpO2:  [99 %] 99 % (01/13 0734)  EXAM: Patient well-developed well-nourished white male in no acute distress. Lungs are clear to auscultation , the patient has symmetrical respiratory excursion. Heart has a regular rate and rhythm normal S1 and S2 no murmur.   Abdomen is soft nontender nondistended bowel sounds are present. Extremity examination shows no clubbing cyanosis or edema. Musculoskeletal examination shows no tenderness to palpation of the  cervical spinous processes or paracervical musculature. He has diminished range of motion neck, particularly with extension, which causes pain into the right upper extremity. Motor examination shows 5 over 5 strength in the upper extremities including the deltoid biceps triceps and intrinsics and grip. Sensation is intact to pinprick throughout the digits of the upper extremities. Reflexes are symmetrical and without evidence of pathologic reflexes. Patient has a normal gait and stance.   Data Review:CBC    Component Value Date/Time   WBC 9.0 08/21/2012 0936   RBC 5.37 08/21/2012 0936   HGB 15.8 08/21/2012 0936   HCT 45.3 08/21/2012 0936   PLT 245 08/21/2012 0936   MCV 84.4 08/21/2012 0936   MCH 29.4 08/21/2012 0936   MCHC 34.9 08/21/2012 0936   RDW 12.5 08/21/2012 0936                          BMET    Component Value Date/Time   NA 137 08/21/2012 0936   K 4.6 08/21/2012 0936   CL 97 08/21/2012 0936   CO2 28 08/21/2012 0936   GLUCOSE 147* 08/21/2012 0936   BUN 13 08/21/2012 0936   CREATININE 0.78 08/21/2012 0936   CALCIUM 9.7 08/21/2012 0936   GFRNONAA >90 08/21/2012 0936   GFRAA >90 08/21/2012 0936     Assessment/Plan: Patient with multilevel cervical disc herniations, spondylosis, and degenerative disc disease was admitted now for 2 level CV-6 and C6-7 ACDF.  I've discussed with the patient the nature of his condition, the nature the surgical procedure, the typical length of surgery, hospital stay, and overall recuperation. We discussed limitations postoperatively. I discussed risks of surgery including risks of infection, bleeding, possibly need for transfusion, the risk of nerve root dysfunction with pain, weakness, numbness, or paresthesias, the risk of spinal cord dysfunction with paralysis of all 4 limbs and quadriplegia, and the risk of dural tear and CSF leakage and possible need for further surgery, the risk of esophageal dysfunction causing dysphagia and the risk of laryngeal dysfunction causing hoarseness  of the voice, the risk of failure of the arthrodesis and the possible need for further surgery, and the risk of anesthetic complications including myocardial infarction, stroke, pneumonia, and death. We also discussed the need for postoperative immobilization in a cervical collar. Understanding all this the patient does wish  to proceed with surgery and is admitted for such.    Hewitt Shorts, MD 08/25/2012 7:55 AM

## 2012-08-25 NOTE — Anesthesia Procedure Notes (Signed)
Procedure Name: Intubation Date/Time: 08/25/2012 10:32 AM Performed by: Rogelia Boga Pre-anesthesia Checklist: Patient identified, Emergency Drugs available, Suction available, Patient being monitored and Timeout performed Patient Re-evaluated:Patient Re-evaluated prior to inductionOxygen Delivery Method: Circle system utilized Preoxygenation: Pre-oxygenation with 100% oxygen Intubation Type: IV induction Ventilation: Mask ventilation without difficulty and Oral airway inserted - appropriate to patient size Laryngoscope Size: Mac and 4 Grade View: Grade II Tube type: Oral Tube size: 7.5 mm Number of attempts: 1 Airway Equipment and Method: Stylet Placement Confirmation: ETT inserted through vocal cords under direct vision,  positive ETCO2 and breath sounds checked- equal and bilateral Secured at: 23 cm Tube secured with: Tape Dental Injury: Teeth and Oropharynx as per pre-operative assessment

## 2012-08-25 NOTE — Anesthesia Postprocedure Evaluation (Signed)
Anesthesia Post Note  Patient: Phillip Clements  Procedure(s) Performed: Procedure(s) (LRB): ANTERIOR CERVICAL DECOMPRESSION/DISCECTOMY FUSION 2 LEVELS (N/A)  Anesthesia type: General  Patient location: PACU  Post pain: Pain level controlled and Adequate analgesia  Post assessment: Post-op Vital signs reviewed, Patient's Cardiovascular Status Stable, Respiratory Function Stable, Patent Airway and Pain level controlled  Last Vitals:  Filed Vitals:   08/25/12 1500  BP:   Pulse: 74  Temp:   Resp: 20    Post vital signs: Reviewed and stable  Level of consciousness: awake, alert  and oriented  Complications: No apparent anesthesia complications

## 2012-08-25 NOTE — Op Note (Signed)
08/25/2012  8:17 PM  PATIENT:  Arleta Creek  62 y.o. male  PRE-OPERATIVE DIAGNOSIS:  cervical herniated disc cervical degenerative disc disease cervical spondylosis cervical radiculopathy  POST-OPERATIVE DIAGNOSIS:  cervical herniated disc cervical degenerative disc disease cervical spondylosis cervical radiculopathy  PROCEDURE:  Procedure(s): ANTERIOR CERVICAL DECOMPRESSION/DISCECTOMY FUSION 2 LEVELS: C5-6 and C6-7 anterior cervical decompression and arthrodesis with allograft and tether cervical plating  SURGEON:  Surgeon(s): Hewitt Shorts, MD Mariam Dollar, MD  ASSISTANTS: Donalee Citrin, M.D.  ANESTHESIA:   general  EBL:  Total I/O In: -  Out: 125 [Urine:125]  BLOOD ADMINISTERED:none  COUNT: Correct per nursing staff  DICTATION: Patient was brought to the operating room placed under general endotracheal anesthesia. Patient was placed in 10 pounds of halter traction. The neck was prepped with Betadine soap and solution and draped in a sterile fashion. A horizontal incision was made on the left side of the neck. The line of the incision was infiltrated with local anesthetic with epinephrine. Dissection was carried down thru the subcutaneous tissue and platysma, bipolar cautery was used to maintain hemostasis. Dissection was then carried out thru an avascular plane leaving the sternocleidomastoid carotid artery and jugular vein laterally and the trachea and esophagus medially. The ventral aspect of the vertebral column was identified and a localizing x-ray was taken. The C3-4 and C4-5 levels were identified , we thereby were able to identify the C5-6 and C6-7 levels, despite them being obscured by the patient's shoulders. The annulus at each level was incised and the disc space entered. There is significant ventral osteophytic overgrowth at each level, it was removed using Kerrison punches and the high-speed drill. Discectomy was performed with micro-curettes and pituitary rongeurs. The  operating microscope was draped and brought into the field provided additional magnification illumination and visualization. Discectomy was continued posteriorly thru the disc space and then the cartilaginous endplate was removed using micro-curettes along with the high-speed drill. Posterior osteophytic overgrowth was removed each level using the high-speed drill along with a 2 mm thin footplated Kerrison punch. Posterior longitudinal ligament along with disc herniation was carefully removed, decompressing the spinal canal and thecal sac. We then continued to remove osteophytic overgrowth and disc material decompressing the neural foramina and exiting nerve roots bilaterally. On the right side at C5-6 we did find soft disc herniation within the right C5-6 neural foramen. Once the decompression was completed hemostasis was established at each level with the use of Gelfoam with thrombin and Surgifoam. Hemostasis was confirmed. We then measured the height of the intravertebral disc space level and selected a 7 millimeter in height structural allograft for the C5-6 level and a 8 millimeter in height structural allograft for the C6-7 level . Each was hydrated and saline solution and then gently positioned in the intravertebral disc space and countersunk. We then selected a 35 millimeter in height Tether cervical plate. It was positioned over the fusion construct and secured to the vertebra with a pair of 4 x 13 mm variable screws at the C5 level, a single 4 x 13 mm fixed screw at the C6 level, and a pair of 4 x 13 mm variable screws at the C7 level. Each screw hole was started with the high-speed drill and then the screws placed, once all the screws were placed final tightening was performed. The wound was irrigated with bacitracin solution checked for hemostasis which was established and confirmed. We did not take an x-ray since we cannot visualize the lower cervical spine due  to the patient's large shoulders. However  under direct visualization the interbody grafts were in good position, the plate and screws were in good position, and the overall fusion construct looked good. We then proceeded with closure. The platysma was closed with interrupted inverted 2-0 undyed Vicryl suture, the subcutaneous and subcuticular closed with interrupted inverted 3-0 undyed Vicryl suture. The skin edges were approximated with Dermabond. Following surgery the patient was taken out of cervical traction. To be reversed and the anesthetic and taken to the recovery room for further care.  PLAN OF CARE: Admit for overnight observation  PATIENT DISPOSITION:  PACU - hemodynamically stable.   Delay start of Pharmacological VTE agent (>24hrs) due to surgical blood loss or risk of bleeding:  yes   \

## 2012-08-26 ENCOUNTER — Encounter (HOSPITAL_COMMUNITY): Payer: Self-pay | Admitting: Neurosurgery

## 2012-08-26 LAB — GLUCOSE, CAPILLARY: Glucose-Capillary: 198 mg/dL — ABNORMAL HIGH (ref 70–99)

## 2012-08-26 MED ORDER — HYDROCODONE-ACETAMINOPHEN 5-325 MG PO TABS: 1.0000 | ORAL_TABLET | ORAL | Status: DC | PRN

## 2012-08-26 NOTE — Discharge Summary (Signed)
Physician Discharge Summary  Patient ID: Phillip Clements MRN: [id number] DOB/AGE: 1950-09-01 62 y.o.  Admit date: 08/25/2012 Discharge date: 08/26/2012  Admission Diagnoses: Cervical herniated disc, cervical spondylosis, cervical degenerative disc disease, cervical radiculopathy    Discharge Diagnoses:  Cervical herniated disc, cervical spondylosis, cervical degenerative disc disease, cervical radiculopathy   Discharged Condition: good  Hospital Course: Patient was admitted underwent a 2 level C5-6 and C6-7 ACDF. Postoperatively he has done well. He is up and ambulating. He is voiding. His wound is healing nicely. He is being discharged home with instructions regarding wound care and activities. He is to followup with me in the office in 3 weeks.   Discharge Exam: Blood pressure 141/73, pulse 69, temperature 99 F (37.2 C), temperature source Oral, resp. rate 18, height 5\' 9"  (1.753 m), weight 124.098 kg (273 lb 9.4 oz), SpO2 98.00%.  Disposition:  home      Medication List     As of 08/26/2012  8:09 AM    TAKE these medications         aspirin 81 MG EC tablet   Take 81 mg by mouth daily.      bicalutamide 50 MG tablet   Commonly known as: CASODEX   Take 50 mg by mouth daily.      glipiZIDE 5 MG tablet   Commonly known as: GLUCOTROL   Take 5 mg by mouth daily.      glucose blood test strip   by Other route. Use daily as ordered. Code: 250.00      HYDROcodone-acetaminophen 5-325 MG per tablet   Commonly known as: NORCO/VICODIN   Take 1-2 tablets by mouth every 4 (four) hours as needed.      Insulin Pen Needle 31G X 8 MM Misc   Use as directed. Code:250.00      LANTUS 100 UNIT/ML injection   Generic drug: insulin glargine   Inject 150 Units into the skin at bedtime.      LUPRON IJ   Inject as directed every 4 (four) months.      VICTOZA 18 MG/3ML Soln   Generic drug: Liraglutide   Inject into the skin at bedtime. Administer 1.8 units every night          Signed: Hewitt Shorts, MD 08/26/2012, 8:09 AM  And and about people fit and he is only a people

## 2012-08-26 NOTE — Discharge Instructions (Signed)
Anterior Cervical Diskectomy and Fusion  Anterior cervical diskectomy is surgery done on the upper spine to relieve pressure on one or more nerve roots, or on the spinal cord. There are 7 bones in your neck, called the cervical spine. These 7 bones (vertebrae) sit one on top of the other. Cushions (intervertebral disks) separate the vertebrae and act like shock absorbers. As we age, degeneration of our bones, joints, and disks can cause neck pain and tightening around the spinal cord and nerve roots. This causes arm pain and weakness.  Degeneration involves:  Herniated Disk. With age, the disks dry up and can rupture. In this condition, the center of the disk bulges out (disk herniation). This can cause pressure on a nerve, which produces pain or weakness in the arm.  Bone spurs and spinal stenosis. As we age, growths often develop on our bones. These growths are called bone spurs (osteophytes). A bone spur is a collection of calcium. As bone spurs grow and extend, the vertebral openings become narrow. The spinal canal and/or the foramen (opening for nerve passageways) become smaller. This narrowing (stenosis) may cause pinching (compression) of the spinal cord or the spinal nerve root. The nerve injury can cause pain, weakness, numbness, and loss of coordination in the upper limbs. Often, patients have difficulty with their hand writing or they start dropping things, because their hand grip is weaker. The spinal cord damage can cause increased stiffness, more frequent falls, electric shooting pain, and changes in bowel and bladder control.  Degeneration in the neck results in three common problems:  Radiculopathy - Nerve compression that results in weakness or pain that radiates down the arm.  Myelopathy - Spinal cord compression that causes stiffness, difficulty with walking, coordination, and trouble with bowel or bladder habits.  Neck pain - Worn out joints cause pain as the neck moves.  Treatment:    Radiculopathy - Surgery is performed to remove the bony and disk material that is pushing on the nerve.  Myelopathy - Surgery is performed to remove the bony and disk material that pushes on the spinal cord.  Neck pain - Surgery is performed to combine (fuse) the joints of the neck together, so they cannot move or cause pain.  Surgery can be done from the front or the back of the neck. When it is done from the front, it is called an anterior (front) cervical (neck) diskectomy (removal of the disk) and fusion.  LET YOUR CAREGIVER KNOW ABOUT:  Recent infections.  Any shooting pains down your leg, when you move your neck.  Any difficulty swallowing.  If you smoke.  If you are taking any blood thinners or anti-inflammatory drugs.  Any history of injury to your shoulders.  Any history of injury to your vocal cords.  Any foreign objects in your body from a previous surgery.  Any recent fevers or illness.  Past medical history (diabetes, strokes).  Past problems with anesthetics.  Possibility of pregnancy.  History of blood clots (deep vein thrombosis).  History of bleeding or blood problems.  Past surgery.  Other health problems.  Allergies.  Medicines you take, including herbs, eye drops, over-the-counter medicines, and creams.  Use of steroids (by mouth or creams).  RISKS AND COMPLICATIONS  Infection.  Bleeding.  Injury to the following structures:  Carotid artery. This can result in a stroke or significant amount of bleeding.  Esophageus, resulting in difficulty swallowing.  Recurrent laryngeal nerve, resulting in hoarseness of the voice.  Spinal cord injury,  ranging from mild to complete quadriparesis (muscle weakness in all four limbs).  Nerve root injury, resulting in muscle weakness in the upper limb.  Leakage of cerebrospinal fluid.  BEFORE THE PROCEDURE  You will be given medicine to help you sleep (general anesthetic), and a breathing tube will be placed.  You will be given  antibiotics to keep the infection rate down.  The incision site on your neck will be marked.  Your neck will be cleaned, to reduce the risk of infection.  PROCEDURE  An anterior cervical fusion means that the operation is done through the front (anterior) part of your neck. The cut made by the surgeon (incision) is usually within a skin fold line on the neck. After pushing aside the neck muscles, the surgeon removes the affected, degenerated disk and bone spurs (osteophytes), which takes the pressure off the nerves and spinal cord. This is called a decompression. The area where the disk was removed is then filled with a small piece of plastic. This plastic takes the place of the disk and keeps the nerve passageway (foramen) open and clear for the nerves. In most cases, the surgeon uses metal plates or pins (hardware) in the neck, to help stabilize the level being fused. The hardware reduces motion at that level, so it can fuse. This provides extra support to the neck. A cervical fusion procedure takes anywhere from a couple to several hours, depending on the size of the neck, history of previous surgery, and number of levels being fused.  AFTER THE PROCEDURE  You will likely spend 24-48 hours in the hospital. During this time, your caregivers will look for any signs of complications from the procedure.  Your caregiver will watch you, to make sure that fluid draining from the surgery slows down. It is important that a large mass of blood does not form in your neck, which would cause difficulty with breathing.  You will get 24 hours of antibiotics.  You can start to eat as soon as you feel comfortable.  Once you have started eating, walking, urinating (voiding) and having bowel movements on your own, your caregiver will discharge you home.  HOME CARE INSTRUCTIONS  For 2 weeks, do not soak the incision site under water. Do not swim or take baths. Showers are ok, but rinse off the incision sites.  Do not  over exert yourself. Allow time for the incision to heal.  It can take from 6 weeks to 6 months for fusion to take effect. Your caregiver may ask you to wear a neck collar during this time, as they check the fusion with multiple (serial) X-rays.  Document Released: 07/18/2009 Document Revised: 10/22/2011 Document Reviewed: 07/18/2009  [facility] Patient Information 2013 [place], [place].

## 2012-08-26 NOTE — Progress Notes (Signed)
Pt and wife given D/C instructions with Rx. Pt verbalized understanding of D/C teaching. Pt D/C'd home via wheelchair @ 1030 per MD order. Rema Fendt, RN

## 2012-08-26 NOTE — Progress Notes (Signed)
Utilization review completed. Tyresa Prindiville, RN, BSN. 

## 2013-04-27 ENCOUNTER — Ambulatory Visit (INDEPENDENT_AMBULATORY_CARE_PROVIDER_SITE_OTHER): Payer: BC Managed Care – PPO | Admitting: Family Medicine

## 2013-04-27 ENCOUNTER — Encounter: Payer: Self-pay | Admitting: Family Medicine

## 2013-04-27 VITALS — BP 150/80 | HR 90 | Temp 97.8°F | Wt 270.8 lb

## 2013-04-27 DIAGNOSIS — R42 Dizziness and giddiness: Secondary | ICD-10-CM

## 2013-04-27 DIAGNOSIS — E119 Type 2 diabetes mellitus without complications: Secondary | ICD-10-CM

## 2013-04-27 NOTE — Patient Instructions (Addendum)
Use the bedside exercise and take OTC meclizine as needed for the vertigo.  This should get better gradually.  Be careful with position change.  Go to the lab on the way out.  We'll contact you with your lab report. Take care.

## 2013-04-27 NOTE — Progress Notes (Signed)
New patient- hasn't been seen in >3 years.   Hasn't had DM2 f/u recently.  Would like to est here. Compliant with meds. Due for labs. No hypoglycemia.   Possible vertigo recently.  Was painting this summer. Was doing trim and leaning, felt dizzy, room started spinning. Positional.  Resolved with position change.   Second day of sx- an episode recently when getting out of bed.  His equilibrium was off.  It resolved on its own.  Then later in the day was doing sit ups and the room spun again.  It finally resolved.  Then it happened again later that day- 3rd event that day.  Unclear if hypoglycemia.   Another episode that was positional the next day, picking up the newspaper.    When the room would spin, it would spin the same way.  No focal weakness or focal neuro changes.  No FCNAV but he does have hot flashes from leupron.    PMH and SH reviewed  ROS: See HPI, otherwise noncontributory.  Meds, vitals, and allergies reviewed.   GEN: nad, alert and oriented HEENT: mucous membranes moist NECK: supple w/o LA CV: rrr. PULM: ctab, no inc wob ABD: soft, +bs EXT: no edema SKIN: no acute rash CN 2-12 wnl B, S/S/DTR wnl x4 DHP weakly positive, no sx on position change w/o head rotation.

## 2013-04-28 DIAGNOSIS — H8112 Benign paroxysmal vertigo, left ear: Secondary | ICD-10-CM | POA: Insufficient documentation

## 2013-04-28 LAB — BASIC METABOLIC PANEL
BUN: 14 mg/dL (ref 6–23)
CO2: 29 mEq/L (ref 19–32)
Calcium: 9.3 mg/dL (ref 8.4–10.5)
Chloride: 100 mEq/L (ref 96–112)
Creatinine, Ser: 0.9 mg/dL (ref 0.4–1.5)
GFR: 90.93 mL/min (ref >60.00)
Glucose, Bld: 248 mg/dL — ABNORMAL HIGH (ref 70–99)
Potassium: 4.1 mEq/L (ref 3.5–5.1)
Sodium: 135 mEq/L (ref 135–145)

## 2013-04-28 LAB — HEMOGLOBIN A1C: Hgb A1c MFr Bld: 9.2 % — ABNORMAL HIGH (ref 4.6–6.5)

## 2013-04-28 NOTE — Assessment & Plan Note (Signed)
Likely BPV. D/w pt about home exercises and meclizine.  Okay for outpatient f/u.

## 2013-04-28 NOTE — Assessment & Plan Note (Signed)
Check A1c and we'll proceed from there.  See notes on labs.

## 2013-05-01 ENCOUNTER — Encounter: Payer: Self-pay | Admitting: Family Medicine

## 2013-05-01 ENCOUNTER — Ambulatory Visit (INDEPENDENT_AMBULATORY_CARE_PROVIDER_SITE_OTHER): Payer: BC Managed Care – PPO | Admitting: Family Medicine

## 2013-05-01 VITALS — BP 138/70 | HR 83 | Temp 97.7°F | Ht 69.0 in | Wt 272.5 lb

## 2013-05-01 DIAGNOSIS — E119 Type 2 diabetes mellitus without complications: Secondary | ICD-10-CM

## 2013-05-01 DIAGNOSIS — Z23 Encounter for immunization: Secondary | ICD-10-CM

## 2013-05-01 MED ORDER — SITAGLIPTIN PHOSPHATE 50 MG PO TABS: 50.0000 mg | ORAL_TABLET | Freq: Every day | ORAL | Status: DC

## 2013-05-01 MED ORDER — INSULIN GLARGINE 100 UNIT/ML ~~LOC~~ SOLN: 75.0000 [IU] | Freq: Two times a day (BID) | SUBCUTANEOUS | Status: DC

## 2013-05-01 NOTE — Progress Notes (Signed)
Vertigo when he laid back on the incline bench with exercise. Resolve after sitting up.  D/w pt again about home exercises and prn use of meclizine.  No sx currently.   Diabetes:  Using medications without difficulties:yes Hypoglycemic episodes:no Hyperglycemic episodes:yes Feet problems:no Labs d/w pt.    Has been more SOB chronically over the last year.  This may be related to the leupron, weight, and loss of muscle mass.  He has not CP, no acute sx and is going to talk with uro about this.  He can walk on a treadmill w/o sx.  No wheeze.  No new BLE edema  Meds, vitals, and allergies reviewed.   ROS: See HPI.  Otherwise negative.    GEN: nad, alert and oriented, overweight HEENT: mucous membranes moist NECK: supple w/o LA CV: rrr. PULM: ctab, no inc wob ABD: soft, +bs EXT: no edema SKIN: no acute rash  Diabetic foot exam: Normal inspection No skin breakdown No calluses  Normal DP pulses Normal sensation to light touch and monofilament Nails normal

## 2013-05-01 NOTE — Patient Instructions (Addendum)
Stop the glipizide.   In a few days, split the lantus to 75 units twice a day.  After that, start the [place].  If your sugar drops, then cut the daily lantus dose.   Give me an update at that point.  And we'll make plans for the lantus dose at that point.  Take care.

## 2013-05-03 ENCOUNTER — Encounter: Payer: Self-pay | Admitting: Family Medicine

## 2013-05-03 NOTE — Assessment & Plan Note (Signed)
Stop the glipizide.  In a few days, he'll split the lantus to 75 units twice a day.  After that, he'll start the [place]. If sugar drops, then he'll cut the daily lantus dose.  He'll give me an update at that point. And we'll make plans for the lantus dose at that point.  He'll need lantus refills by then.    He'll f/u with uro about the leupron and possible side effects.  He agrees with plan.

## 2013-06-11 DIAGNOSIS — N320 Bladder-neck obstruction: Secondary | ICD-10-CM | POA: Insufficient documentation

## 2013-08-11 ENCOUNTER — Other Ambulatory Visit: Payer: Self-pay | Admitting: *Deleted

## 2013-08-11 MED ORDER — INSULIN PEN NEEDLE 31G X 8 MM MISC: Status: DC

## 2013-08-11 NOTE — Telephone Encounter (Signed)
Ok to fill 

## 2013-08-11 NOTE — Telephone Encounter (Signed)
Sent.  Needs DM2 f/u scheduled with labs ahead of time.  Thanks.

## 2013-08-11 NOTE — Telephone Encounter (Signed)
Left detailed message on voicemail.  

## 2013-08-21 ENCOUNTER — Other Ambulatory Visit: Payer: Self-pay | Admitting: *Deleted

## 2013-08-21 MED ORDER — INSULIN GLARGINE 100 UNIT/ML ~~LOC~~ SOLN: 75.0000 [IU] | Freq: Two times a day (BID) | SUBCUTANEOUS | Status: DC

## 2013-09-01 ENCOUNTER — Other Ambulatory Visit: Payer: Self-pay | Admitting: Family Medicine

## 2013-09-01 DIAGNOSIS — E119 Type 2 diabetes mellitus without complications: Secondary | ICD-10-CM

## 2013-09-07 ENCOUNTER — Other Ambulatory Visit (INDEPENDENT_AMBULATORY_CARE_PROVIDER_SITE_OTHER): Payer: BC Managed Care – PPO

## 2013-09-07 DIAGNOSIS — E119 Type 2 diabetes mellitus without complications: Secondary | ICD-10-CM

## 2013-09-07 LAB — LIPID PANEL
Cholesterol: 202 mg/dL — ABNORMAL HIGH (ref 0–200)
HDL: 31.5 mg/dL — ABNORMAL LOW (ref >39.00)
Total CHOL/HDL Ratio: 6
Triglycerides: 177 mg/dL — ABNORMAL HIGH (ref 0.0–149.0)
VLDL: 35.4 mg/dL (ref 0.0–40.0)

## 2013-09-07 LAB — COMPREHENSIVE METABOLIC PANEL
ALT: 17 U/L (ref 0–53)
AST: 10 U/L (ref 0–37)
Albumin: 3.7 g/dL (ref 3.5–5.2)
Alkaline Phosphatase: 82 U/L (ref 39–117)
BUN: 17 mg/dL (ref 6–23)
CO2: 30 mEq/L (ref 19–32)
Calcium: 8.7 mg/dL (ref 8.4–10.5)
Chloride: 102 mEq/L (ref 96–112)
Creatinine, Ser: 0.9 mg/dL (ref 0.4–1.5)
GFR: 88.55 mL/min (ref >60.00)
Glucose, Bld: 215 mg/dL — ABNORMAL HIGH (ref 70–99)
Potassium: 4.6 mEq/L (ref 3.5–5.1)
Sodium: 137 mEq/L (ref 135–145)
Total Bilirubin: 0.6 mg/dL (ref 0.3–1.2)
Total Protein: 6.5 g/dL (ref 6.0–8.3)

## 2013-09-07 LAB — LDL CHOLESTEROL, DIRECT: Direct LDL: 157.1 mg/dL

## 2013-09-07 LAB — HEMOGLOBIN A1C: Hgb A1c MFr Bld: 9.6 % — ABNORMAL HIGH (ref 4.6–6.5)

## 2013-09-14 ENCOUNTER — Ambulatory Visit (INDEPENDENT_AMBULATORY_CARE_PROVIDER_SITE_OTHER): Payer: BC Managed Care – PPO | Admitting: Family Medicine

## 2013-09-14 ENCOUNTER — Encounter: Payer: Self-pay | Admitting: Family Medicine

## 2013-09-14 VITALS — BP 140/76 | HR 74 | Temp 98.1°F | Wt 265.0 lb

## 2013-09-14 DIAGNOSIS — E1149 Type 2 diabetes mellitus with other diabetic neurological complication: Secondary | ICD-10-CM

## 2013-09-14 DIAGNOSIS — C61 Malignant neoplasm of prostate: Secondary | ICD-10-CM

## 2013-09-14 DIAGNOSIS — E119 Type 2 diabetes mellitus without complications: Secondary | ICD-10-CM

## 2013-09-14 NOTE — Assessment & Plan Note (Signed)
Per uro.

## 2013-09-14 NOTE — Patient Instructions (Addendum)
Call about an eye exam.  We'll contact you with your lab report. Fill out the grid with pre/post sugars.  We'll go from there.  Take care.   Glad to see you.

## 2013-09-14 NOTE — Assessment & Plan Note (Signed)
D/w pt.  He'll fill out a grid of pre/post sugars and we'll go from there.  D/w pt about like mealtime insulin.  Labs. Dw pt. He agrees with plan.  He'll check with podiatry.  Would use talc to limit sweating, change socks BID in meantime.

## 2013-09-14 NOTE — Progress Notes (Signed)
Pre-visit discussion using our clinic review tool. No additional management support is needed unless otherwise documented below in the visit note.  Last leupron injection done in 10/14.  Followed by Dr. Jacqlyn Larsen.  Per Pt, no change in PSA on last check.    His mother died fall Dec 13, 2012, I extended my condolences.  His family is supportive.    Diabetes:  Using medications without difficulties:yes Hypoglycemic episodes: rare Hyperglycemic episodes: likely  Feet problems: dry skin, cracking, worse over the winter.   Blood Sugars averaging: had been variable in the AM eye exam within last year: due, encouraged.   Meds, vitals, and allergies reviewed.   ROS: See HPI.  Otherwise negative.    GEN: nad, alert and oriented HEENT: mucous membranes moist NECK: supple w/o LA CV: rrr. PULM: ctab, no inc wob ABD: soft, +bs EXT: no edema SKIN: no acute rash  Diabetic foot exam: Chapped skin, likely from excessive sweating on the feet noted Normal DP pulses Dec sensation to light touch and monofilament on L>R foot Nails chipped, he'll f/u with podiatry.

## 2013-09-15 LAB — MICROALBUMIN / CREATININE URINE RATIO
Creatinine,U: 106.4 mg/dL
Microalb Creat Ratio: 37.7 mg/g — ABNORMAL HIGH (ref 0.0–30.0)
Microalb, Ur: 40.1 mg/dL — ABNORMAL HIGH (ref 0.0–1.9)

## 2013-09-16 ENCOUNTER — Telehealth: Payer: Self-pay | Admitting: Family Medicine

## 2013-09-16 ENCOUNTER — Other Ambulatory Visit: Payer: Self-pay | Admitting: Family Medicine

## 2013-09-16 MED ORDER — LISINOPRIL 2.5 MG PO TABS: 2.5000 mg | ORAL_TABLET | Freq: Every day | ORAL | Status: DC

## 2013-09-16 NOTE — Telephone Encounter (Signed)
Relevant patient education mailed to patient.  

## 2013-09-21 ENCOUNTER — Telehealth: Payer: Self-pay | Admitting: *Deleted

## 2013-09-21 NOTE — Telephone Encounter (Signed)
Received faxed refill from pharmacy for Lantus solostar, inject 75 unites two times per day as directed. Please see medication sheet and confirm correct directions?

## 2013-09-22 MED ORDER — INSULIN GLARGINE 100 UNIT/ML ~~LOC~~ SOLN: 150.0000 [IU] | Freq: Every day | SUBCUTANEOUS | Status: DC

## 2013-09-22 NOTE — Telephone Encounter (Signed)
Sent!

## 2013-09-25 ENCOUNTER — Other Ambulatory Visit: Payer: Self-pay | Admitting: Family Medicine

## 2013-09-25 NOTE — Telephone Encounter (Signed)
Pt dropped off sugar log for review.  Best phone number to reach patient is [phone] / lt

## 2013-09-25 NOTE — Telephone Encounter (Signed)
Log placed in your In Box.

## 2013-09-27 NOTE — Telephone Encounter (Signed)
Notify pt.  He's going to need mealtime insulin in addition to his lantus.   We can send in a pen of shorter acting insulin to take with meals. He can use the same size pen needles.  I would start with 5 units with a meal, increasing to 10 units per meal if needed to get his sugar readings (2 hours after the meal) <180.   I would have him check his sugars pre/post meals as he did on the grid.  Report back in about 1 week.  I put the rx on the med list, please send if he agrees.  If he wants to come in to talk to me about it, that's fine too.  We need to recheck an A1c in about 3 months before a visit.

## 2013-09-28 NOTE — Telephone Encounter (Signed)
Left message on patient's voicemail to return call

## 2013-09-30 NOTE — Telephone Encounter (Signed)
Tried to call patient back and got a busy signal. Will have to try again later.

## 2013-10-02 MED ORDER — INSULIN LISPRO 100 UNIT/ML (KWIKPEN): 5.0000 [IU] | PEN_INJECTOR | Freq: Three times a day (TID) | SUBCUTANEOUS | Status: DC

## 2013-10-02 NOTE — Telephone Encounter (Signed)
Patient advised. Medication phoned to pharmacy. Lab and 3 mos FU scheduled.

## 2013-10-07 ENCOUNTER — Other Ambulatory Visit: Payer: Self-pay | Admitting: Family Medicine

## 2013-10-07 MED ORDER — SITAGLIPTIN PHOSPHATE 50 MG PO TABS: 50.0000 mg | ORAL_TABLET | Freq: Every day | ORAL | Status: DC

## 2013-10-09 ENCOUNTER — Telehealth: Payer: Self-pay

## 2013-10-09 MED ORDER — INSULIN ASPART 100 UNIT/ML FLEXPEN: 5.0000 [IU] | PEN_INJECTOR | Freq: Three times a day (TID) | SUBCUTANEOUS | Status: DC

## 2013-10-09 NOTE — Telephone Encounter (Signed)
Please call the pharmacy and make the change to novolog.  The sig is still the same.  If they don't have the pens with novolog, please change the rx to 68ml vials, disp 1 vial with 12 rf.  He'll also need 100 syringes/needles with 3 rf.  Thanks.  Please update the med list.

## 2013-10-09 NOTE — Telephone Encounter (Signed)
Pt called back requesting status of insulin; advised pt as instructed. Spoke with Clair Gulling at [facility] and he will get Novolog flex pen sent thru ins today.Sig is the same and reviewed with Clair Gulling and the pt.  Dr Damita Dunnings tried to update med list but when I tried to go under meds and orders notice came up can only list this med as historical.Please advise.

## 2013-10-09 NOTE — Telephone Encounter (Signed)
Pt has not started Humalog yet due to pts insurance does not approve Humalog; spoke with [place] and ins. Will approve Novolog or prior auth will need to be done. Pt is presently taking Lantus. Pt said needs prior auth approved today or pt will have to pay $500.00. Pt does not have problem taking Novolog if Dr Damita Dunnings thinks that it is OK for pt to take Novolog. Pt request cb.

## 2013-10-09 NOTE — Telephone Encounter (Signed)
I was able to get it in.  I resent it just in case.  FYI.  Thanks.

## 2013-10-21 ENCOUNTER — Other Ambulatory Visit: Payer: Self-pay | Admitting: Family Medicine

## 2013-10-21 DIAGNOSIS — C61 Malignant neoplasm of prostate: Secondary | ICD-10-CM

## 2013-10-21 MED ORDER — LEUPROLIDE ACETATE (6 MONTH) 45 MG ~~LOC~~ KIT: 45.0000 mg | PACK | SUBCUTANEOUS | Status: DC

## 2013-12-16 ENCOUNTER — Other Ambulatory Visit: Payer: Self-pay | Admitting: Family Medicine

## 2013-12-16 DIAGNOSIS — E1149 Type 2 diabetes mellitus with other diabetic neurological complication: Secondary | ICD-10-CM

## 2013-12-24 ENCOUNTER — Other Ambulatory Visit (INDEPENDENT_AMBULATORY_CARE_PROVIDER_SITE_OTHER): Payer: BC Managed Care – PPO

## 2013-12-24 DIAGNOSIS — E1149 Type 2 diabetes mellitus with other diabetic neurological complication: Secondary | ICD-10-CM

## 2013-12-24 LAB — HEMOGLOBIN A1C: Hgb A1c MFr Bld: 9.1 % — ABNORMAL HIGH (ref 4.6–6.5)

## 2013-12-29 ENCOUNTER — Encounter: Payer: Self-pay | Admitting: Family Medicine

## 2013-12-29 ENCOUNTER — Ambulatory Visit (INDEPENDENT_AMBULATORY_CARE_PROVIDER_SITE_OTHER): Payer: BC Managed Care – PPO | Admitting: Family Medicine

## 2013-12-29 VITALS — BP 142/72 | HR 76 | Temp 98.2°F | Wt 266.8 lb

## 2013-12-29 DIAGNOSIS — Z6379 Other stressful life events affecting family and household: Secondary | ICD-10-CM

## 2013-12-29 DIAGNOSIS — Z636 Dependent relative needing care at home: Secondary | ICD-10-CM

## 2013-12-29 DIAGNOSIS — L821 Other seborrheic keratosis: Secondary | ICD-10-CM

## 2013-12-29 DIAGNOSIS — E1149 Type 2 diabetes mellitus with other diabetic neurological complication: Secondary | ICD-10-CM

## 2013-12-29 NOTE — Progress Notes (Signed)
Pre visit review using our clinic review tool, if applicable. No additional management support is needed unless otherwise documented below in the visit note.  His father in law died and this has been hard on him and his family.  He is now caring for his wife's mother at their home.  She has dementia and this in another strain.  And he is still working.  This has clearly affected his diet and sugar.    DM2 f/u.  In the last month his sugars have been better.  AMs last week he's been <100.  He hasn't been checking later in the day.  A1c d/w pt.    He'll see Dr. Jacqlyn Larsen again in September re: prostate cancer.  Skin lesion in R sideburn. Present for year or longer.  Not changing per his recollection.  No bleeding, ulceration.  Not tender.    Meds, vitals, and allergies reviewed.   ROS: See HPI.  Otherwise, noncontributory.  GEN: nad, alert and oriented HEENT: mucous membranes moist NECK: supple w/o LA CV: rrr PULM: ctab, no inc wob ABD: soft, +bs EXT: no edema SKIN: no acute rash but ~1cm SK noted on R sideburn.  Not irritated.

## 2013-12-29 NOTE — Patient Instructions (Signed)
Don't change your meds for now.  Fill out the sugar grid and we'll go from there.  Recheck in about 3 months, labs ahead of time.

## 2013-12-30 DIAGNOSIS — L821 Other seborrheic keratosis: Secondary | ICD-10-CM | POA: Insufficient documentation

## 2013-12-30 DIAGNOSIS — Z636 Dependent relative needing care at home: Secondary | ICD-10-CM | POA: Insufficient documentation

## 2013-12-30 NOTE — Assessment & Plan Note (Signed)
Reassured

## 2013-12-30 NOTE — Assessment & Plan Note (Signed)
He is considering his options for his mother in [place] care.

## 2013-12-30 NOTE — Assessment & Plan Note (Signed)
He'll grid out his sugars and notify me.  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  D/w pt.

## 2014-01-05 ENCOUNTER — Ambulatory Visit (INDEPENDENT_AMBULATORY_CARE_PROVIDER_SITE_OTHER)
Admission: RE | Admit: 2014-01-05 | Discharge: 2014-01-05 | Disposition: A | Discharge status: Home / Self Care | Payer: BC Managed Care – PPO | Source: Ambulatory Visit | Attending: Family Medicine | Admitting: Family Medicine

## 2014-01-05 ENCOUNTER — Encounter: Payer: Self-pay | Admitting: Family Medicine

## 2014-01-05 ENCOUNTER — Ambulatory Visit (INDEPENDENT_AMBULATORY_CARE_PROVIDER_SITE_OTHER): Payer: BC Managed Care – PPO | Admitting: Family Medicine

## 2014-01-05 VITALS — BP 120/66 | HR 74 | Temp 98.0°F | Wt 262.0 lb

## 2014-01-05 DIAGNOSIS — R0602 Shortness of breath: Secondary | ICD-10-CM

## 2014-01-05 MED ORDER — ALBUTEROL SULFATE HFA 108 (90 BASE) MCG/ACT IN AERS: 1.0000 | INHALATION_SPRAY | Freq: Four times a day (QID) | RESPIRATORY_TRACT | Status: DC | PRN

## 2014-01-05 NOTE — Assessment & Plan Note (Signed)
At rest, not with exertion.  Only in 2 rooms of his house.  Check CXR today and he'll check filters/ducts at home.  Use SABA prn and update me as needed.  He agrees.

## 2014-01-05 NOTE — Progress Notes (Signed)
Pre visit review using our clinic review tool, if applicable. No additional management support is needed unless otherwise documented below in the visit note.  Since he has been on leupron he has had more fatigue and less exercise tolerance.  A few weeks ago, he was able to work outside for a few days in a row, did well.  Later that night he had trouble catching his breath (at rest).  This past weekend he had fallen asleep and then woke up with SOB.  He seems to only have trouble in two rooms in the house.  He didn't know if the ductwork in those rooms could be the issue.  He is due to change his filters.  He actually slept outside and did well one night.  The troublesome symptoms don't happen with exertion.  Coughing more.  Occ sputum, scant.  No fevers.  Occ nonspecific chest pain with cough at rest.   No clear h/o wheeze.    PMH and SH reviewed  ROS: See HPI, otherwise noncontributory.  Meds, vitals, and allergies reviewed.   GEN: nad, alert and oriented HEENT: mucous membranes moist NECK: supple w/o LA CV: rrr. PULM: ctab, no inc wob ABD: soft, +bs EXT: no edema  CXR reviewed.

## 2014-01-05 NOTE — Patient Instructions (Signed)
Go to the lab on the way out.  We'll contact you with your xray report. Use the inhaler if needed and update me.  Change your filters and check the ductwork.  Take care.

## 2014-01-07 ENCOUNTER — Telehealth: Payer: Self-pay | Admitting: Family Medicine

## 2014-01-07 NOTE — Telephone Encounter (Signed)
Notify pt.  I looked at his pre/post sugar grid.  His numbers aren't very high usually and he doesn't appear to need a change in meds at this point.  I would recheck his A1c later in the year.  It may be that his A1c lags his progress.  If he can continue to work more on diet, he may not need a med change.  Thanks.

## 2014-01-07 NOTE — Telephone Encounter (Signed)
Patient advised.

## 2014-03-04 ENCOUNTER — Ambulatory Visit (INDEPENDENT_AMBULATORY_CARE_PROVIDER_SITE_OTHER): Payer: BC Managed Care – PPO | Admitting: Family Medicine

## 2014-03-04 ENCOUNTER — Encounter: Payer: Self-pay | Admitting: Family Medicine

## 2014-03-04 VITALS — BP 124/70 | HR 100 | Temp 100.1°F | Wt 256.5 lb

## 2014-03-04 DIAGNOSIS — J189 Pneumonia, unspecified organism: Secondary | ICD-10-CM | POA: Insufficient documentation

## 2014-03-04 MED ORDER — CEFUROXIME AXETIL 500 MG PO TABS: 500.0000 mg | ORAL_TABLET | Freq: Two times a day (BID) | ORAL | Status: DC

## 2014-03-04 MED ORDER — AZITHROMYCIN 250 MG PO TABS: ORAL_TABLET | ORAL | Status: DC

## 2014-03-04 NOTE — Progress Notes (Signed)
BP 124/70  Pulse 100  Temp(Src) 100.1 F (37.8 C) (Oral)  Wt 256 lb 8 oz (116.348 kg)  SpO2 94%   CC: chest congestion  Subjective:    Patient ID: Phillip Mimes Sr., male    DOB: Jul 10, 1951, 63 y.o.   MRN: [id number]  HPI: Phillip HAVARD Sr. is a 63 y.o. male presenting on 03/04/2014 for Chest congestion   Pleasant 63 yo diabetic hypertensive patient of Dr. Josefine Class with h/o PNA x3 and metastatic prostate cancer on casodex presents today with 4d h/o chest congestion , fatigue and "spacey". Returned from recent trip to [place]. 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 Gen malaise. Productive cough, possibly some chills. Denies significant wheezing or dyspnea. Some discomfort at L arm as well. No known h/o CAD.  Denies chest pain, abd pain, head congestion or headache, ear or tooth pain, PNdrainage.  So far has tried OTC remedies without improvement.  Current smoker - 1 ppd.  No sick contacts at home.  Denies h/o asthma or COPD.  Sugars have been high. Fasting 90s, but during day higher.  Past Medical History  Diagnosis Date  . Diabetes mellitus 2000    Type II  . Hypertension 2000  . Stroke     H/o pontine CVA, prev eval by Dr. Erling Cruz  . Environmental allergies   . Pneumonia     x3 as an adult-  last time 2009  . Arthritis   . 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 Hyperlipidemia   . History of chicken pox      Relevant past medical, surgical, family and social history reviewed and updated as indicated.  Allergies and medications reviewed and updated. Current Outpatient Prescriptions on File Prior to Visit  Medication Sig  . albuterol (PROVENTIL HFA;VENTOLIN HFA) 108 (90 BASE) MCG/ACT inhaler Inhale 1-2 puffs into the lungs every 6 (six) hours as needed for wheezing or shortness of breath.  Marland Kitchen aspirin 81 MG EC tablet Take 81 mg by mouth daily.    . bicalutamide (CASODEX) 50 MG tablet Take 50 mg by mouth daily.  Marland Kitchen glucose blood test strip by Other  route. Use daily as ordered. Code: 250.00   . insulin aspart (NOVOLOG FLEXPEN) 100 UNIT/ML FlexPen Inject 5-10 Units into the skin 3 (three) times daily with meals.  . insulin glargine (LANTUS) 100 UNIT/ML injection Inject 1.5 mLs (150 Units total) into the skin daily. Disp 15 pens  . Insulin Pen Needle 31G X 8 MM MISC Use as directed to inject blood sugar twice daily dx:250.00  . leuprolide, 6 Month, (LEUPROLIDE ACETATE, 6 MONTH,) 45 MG injection Inject 45 mg into the skin every 6 (six) months.  Marland Kitchen lisinopril (ZESTRIL) 2.5 MG tablet Take 1 tablet (2.5 mg total) by mouth daily.  . sitaGLIPtin (JANUVIA) 50 MG tablet Take 1 tablet (50 mg total) by mouth daily.   No current facility-administered medications on file prior to visit.    Review of Systems Per HPI unless specifically indicated above    Objective:    BP 124/70  Pulse 100  Temp(Src) 100.1 F (37.8 C) (Oral)  Wt 256 lb 8 oz (116.348 kg)  SpO2 94%  Physical Exam  Nursing note and vitals reviewed. Constitutional: He appears well-developed and well-nourished. No distress.  HENT:  Head: Normocephalic and atraumatic.  Right Ear: Hearing, tympanic membrane, external ear and ear canal normal.  Left Ear: Hearing, tympanic membrane, external ear and ear canal  normal.  Nose: Nose normal. No mucosal edema or rhinorrhea. Right sinus exhibits no maxillary sinus tenderness and no frontal sinus tenderness. Left sinus exhibits no maxillary sinus tenderness and no frontal sinus tenderness.  Mouth/Throat: Uvula is midline, oropharynx is clear and moist and mucous membranes are normal. No oropharyngeal exudate, posterior oropharyngeal edema, posterior oropharyngeal erythema or tonsillar abscesses.  Eyes: Conjunctivae and EOM are normal. Pupils are equal, round, and reactive to light. No scleral icterus.  Neck: Normal range of motion. Neck supple.  Cardiovascular: Normal rate, regular rhythm, normal heart sounds and intact distal pulses.   No  murmur heard. Pulmonary/Chest: Effort normal. No respiratory distress. He has no decreased breath sounds. He has no wheezes. He has no rhonchi. He has rales (faint LLL).  Slightly coarse with faint bibasilar crackles  Lymphadenopathy:    He has no cervical adenopathy.  Skin: Skin is warm and dry. No rash noted.   Results for orders placed in visit on 12/24/13  HEMOGLOBIN A1C      Result Value Ref Range   Hemoglobin A1C 9.1 (*) 4.6 - 6.5 %      Assessment & Plan:   Problem List Items Addressed This Visit   CAP (community acquired pneumonia) - Primary     Anticipate recurrent community acquired pneumonia. Treat as per guidelines with comorbidities (mestastatic prostate cancer) with zpack and ceftin x 5 day course. Update if sxs persist or fail to improve. Pt agrees with plan.    Relevant Medications      azithromycin (ZITHROMAX) tablet      cefUROXime (CEFTIN) tablet       Follow up plan: Return if symptoms worsen or fail to improve.

## 2014-03-04 NOTE — Assessment & Plan Note (Signed)
Anticipate recurrent community acquired pneumonia. Treat as per guidelines with comorbidities (mestastatic prostate cancer) with zpack and ceftin x 5 day course. Update if sxs persist or fail to improve. Pt agrees with plan.

## 2014-03-04 NOTE — Patient Instructions (Signed)
I think you may be developing pneumonia. Treat with 2 separate antibiotics sent to pharmacy today - zpack for 5 days and ceftin twice daily for 5 days. Please let us know if not improving as expected, seek urgent care if any worsening. Push fluids and rest.  Pneumonia Pneumonia is an infection of the lungs.  CAUSES Pneumonia may be caused by bacteria or a virus. Usually, these infections are caused by breathing infectious particles into the lungs (respiratory tract). SIGNS AND SYMPTOMS   Cough.  Fever.  Chest pain.  Increased rate of breathing.  Wheezing.  Mucus production. DIAGNOSIS  If you have the common symptoms of pneumonia, your health care provider will typically confirm the diagnosis with a chest X-ray. The X-ray will show an abnormality in the lung (pulmonary infiltrate) if you have pneumonia. Other tests of your blood, urine, or sputum may be done to find the specific cause of your pneumonia. Your health care provider may also do tests (blood gases or pulse oximetry) to see how well your lungs are working. TREATMENT  Some forms of pneumonia may be spread to other people when you cough or sneeze. You may be asked to wear a mask before and during your exam. Pneumonia that is caused by bacteria is treated with antibiotic medicine. Pneumonia that is caused by the influenza virus may be treated with an antiviral medicine. Most other viral infections must run their course. These infections will not respond to antibiotics.  HOME CARE INSTRUCTIONS   Cough suppressants may be used if you are losing too much rest. However, coughing protects you by clearing your lungs. You should avoid using cough suppressants if you can.  Your health care provider may have prescribed medicine if he or she thinks your pneumonia is caused by bacteria or influenza. Finish your medicine even if you start to feel better.  Your health care provider may also prescribe an expectorant. This loosens the mucus  to be coughed up.  Take medicines only as directed by your health care provider.  Do not smoke. Smoking is a common cause of bronchitis and can contribute to pneumonia. If you are a smoker and continue to smoke, your cough may last several weeks after your pneumonia has cleared.  A cold steam vaporizer or humidifier in your room or home may help loosen mucus.  Coughing is often worse at night. Sleeping in a semi-upright position in a recliner or using a couple pillows under your head will help with this.  Get rest as you feel it is needed. Your body will usually let you know when you need to rest. PREVENTION A pneumococcal shot (vaccine) is available to prevent a common bacterial cause of pneumonia. This is usually suggested for:  People over 78 years old.  Patients on chemotherapy.  People with chronic lung problems, such as bronchitis or emphysema.  People with immune system problems. If you are over 65 or have a high risk condition, you may receive the pneumococcal vaccine if you have not received it before. In some countries, a routine influenza vaccine is also recommended. This vaccine can help prevent some cases of pneumonia.You may be offered the influenza vaccine as part of your care. If you smoke, it is time to quit. You may receive instructions on how to stop smoking. Your health care provider can provide medicines and counseling to help you quit. SEEK MEDICAL CARE IF: You have a fever. SEEK IMMEDIATE MEDICAL CARE IF:   Your illness becomes worse. This is  especially true if you are elderly or weakened from any other disease.  You cannot control your cough with suppressants and are losing sleep.  You begin coughing up blood.  You develop pain which is getting worse or is uncontrolled with medicines.  Any of the symptoms which initially brought you in for treatment are getting worse rather than better.  You develop shortness of breath or chest pain. MAKE SURE YOU:    Understand these instructions.  Will watch your condition.  Will get help right away if you are not doing well or get worse. Document Released: 07/30/2005 Document Revised: 12/14/2013 Document Reviewed: 10/19/2010 [facility] Patient Information 2015 [place], [place]. This information is not intended to replace advice given to you by your health care provider. Make sure you discuss any questions you have with your health care provider.

## 2014-03-04 NOTE — Progress Notes (Signed)
Pre visit review using our clinic review tool, if applicable. No additional management support is needed unless otherwise documented below in the visit note. 

## 2014-03-09 ENCOUNTER — Ambulatory Visit (INDEPENDENT_AMBULATORY_CARE_PROVIDER_SITE_OTHER)
Admission: RE | Admit: 2014-03-09 | Discharge: 2014-03-09 | Disposition: A | Discharge status: Home / Self Care | Payer: BC Managed Care – PPO | Source: Ambulatory Visit | Attending: Family Medicine | Admitting: Family Medicine

## 2014-03-09 ENCOUNTER — Encounter: Payer: Self-pay | Admitting: Family Medicine

## 2014-03-09 ENCOUNTER — Ambulatory Visit (INDEPENDENT_AMBULATORY_CARE_PROVIDER_SITE_OTHER): Payer: BC Managed Care – PPO | Admitting: Family Medicine

## 2014-03-09 VITALS — BP 108/60 | HR 63 | Temp 97.7°F | Wt 257.5 lb

## 2014-03-09 DIAGNOSIS — R059 Cough, unspecified: Secondary | ICD-10-CM

## 2014-03-09 DIAGNOSIS — R05 Cough: Secondary | ICD-10-CM

## 2014-03-09 MED ORDER — HYDROCODONE-HOMATROPINE 5-1.5 MG/5ML PO SYRP: 5.0000 mL | ORAL_SOLUTION | Freq: Three times a day (TID) | ORAL | Status: DC | PRN

## 2014-03-09 NOTE — Progress Notes (Signed)
Pre visit review using our clinic review tool, if applicable. No additional management support is needed unless otherwise documented below in the visit note.  Prev note reviewed, no sig change from prev.  Done with ceftin and zmax.  R ear now blocked up. No fevers today but sweats recently.  Cough continues, sleep disrupted.  Cough is worse at night if laying down.  Some sputum, whitish/creamy, thick.  Not SOB unless in a coughing fit, as expected.  No ST unless during a coughing fit.  Occ rhinorrhea. No L ear sx.  No vomiting, no diarrhea.  SABA doesn't help much.  Sugars controlled recently.  Dec in appetite.    Meds, vitals, and allergies reviewed.   ROS: See HPI.  Otherwise, noncontributory.  GEN: nad, alert and oriented HEENT: mucous membranes moist, tm w/o erythema, nasal exam w/o erythema, clear discharge noted,  OP with cobblestoning NECK: supple w/o LA CV: rrr.   PULM: ctab, no inc wob EXT: no edema SKIN: no acute rash  CXR reviewed.

## 2014-03-09 NOTE — Patient Instructions (Signed)
Use the cough medicine and update me in a few days if needed.  Take care.  We'll contact you with your xray report.

## 2014-03-10 NOTE — Assessment & Plan Note (Addendum)
Nontoxic, would use hycodan in the meantime.  If the cough can be stopped, he'll likely feel beter.  Okay for outpatient f/u.  D/w pt.  He agrees.  CXR with no active cardiopulmonary disease.

## 2014-03-11 ENCOUNTER — Other Ambulatory Visit: Payer: Self-pay | Admitting: *Deleted

## 2014-03-11 NOTE — Telephone Encounter (Signed)
I'm glad he got some sleep, but the prev message still stands.

## 2014-03-11 NOTE — Telephone Encounter (Signed)
Pt left message with Triage:  Pt is requesting a refill of the Hycodan, I asked pt about the Rx that was given to him on 03/09/14, pt advise me that he has finished that bottle and needs a new Rx, pt request call back at [phone]

## 2014-03-11 NOTE — Telephone Encounter (Signed)
This is way too early.  He'll have to use otc meds.  I can't refill it that early.

## 2014-03-11 NOTE — Telephone Encounter (Signed)
Patient advised.   Patient says he slept for 5 hours last night and that's the most he's slept in 3 weeks.

## 2014-03-21 ENCOUNTER — Other Ambulatory Visit: Payer: Self-pay | Admitting: Family Medicine

## 2014-03-21 DIAGNOSIS — E1149 Type 2 diabetes mellitus with other diabetic neurological complication: Secondary | ICD-10-CM

## 2014-03-23 ENCOUNTER — Other Ambulatory Visit (INDEPENDENT_AMBULATORY_CARE_PROVIDER_SITE_OTHER): Payer: BC Managed Care – PPO

## 2014-03-23 ENCOUNTER — Encounter: Payer: Self-pay | Admitting: Radiology

## 2014-03-23 DIAGNOSIS — E1149 Type 2 diabetes mellitus with other diabetic neurological complication: Secondary | ICD-10-CM

## 2014-03-23 LAB — LIPID PANEL
Cholesterol: 224 mg/dL — ABNORMAL HIGH (ref 0–200)
HDL: 32.6 mg/dL — ABNORMAL LOW (ref >39.00)
LDL Cholesterol: 153 mg/dL — ABNORMAL HIGH (ref 0–99)
NonHDL: 191.4
Total CHOL/HDL Ratio: 7
Triglycerides: 194 mg/dL — ABNORMAL HIGH (ref 0.0–149.0)
VLDL: 38.8 mg/dL (ref 0.0–40.0)

## 2014-03-23 LAB — HEMOGLOBIN A1C: Hgb A1c MFr Bld: 9.6 % — ABNORMAL HIGH (ref 4.6–6.5)

## 2014-03-29 ENCOUNTER — Ambulatory Visit (INDEPENDENT_AMBULATORY_CARE_PROVIDER_SITE_OTHER)
Admission: RE | Admit: 2014-03-29 | Discharge: 2014-03-29 | Disposition: A | Discharge status: Home / Self Care | Payer: BC Managed Care – PPO | Source: Ambulatory Visit | Attending: Family Medicine | Admitting: Family Medicine

## 2014-03-29 ENCOUNTER — Ambulatory Visit (INDEPENDENT_AMBULATORY_CARE_PROVIDER_SITE_OTHER): Payer: BC Managed Care – PPO | Admitting: Family Medicine

## 2014-03-29 ENCOUNTER — Encounter: Payer: Self-pay | Admitting: Family Medicine

## 2014-03-29 VITALS — BP 134/62 | HR 77 | Temp 98.1°F | Wt 256.5 lb

## 2014-03-29 DIAGNOSIS — R05 Cough: Secondary | ICD-10-CM

## 2014-03-29 DIAGNOSIS — R079 Chest pain, unspecified: Secondary | ICD-10-CM

## 2014-03-29 DIAGNOSIS — R059 Cough, unspecified: Secondary | ICD-10-CM

## 2014-03-29 DIAGNOSIS — E1149 Type 2 diabetes mellitus with other diabetic neurological complication: Secondary | ICD-10-CM

## 2014-03-29 MED ORDER — HYDROCODONE-HOMATROPINE 5-1.5 MG/5ML PO SYRP: 5.0000 mL | ORAL_SOLUTION | Freq: Three times a day (TID) | ORAL | Status: DC | PRN

## 2014-03-29 MED ORDER — NITROGLYCERIN 0.4 MG SL SUBL: 0.4000 mg | SUBLINGUAL_TABLET | SUBLINGUAL | Status: DC | PRN

## 2014-03-29 NOTE — Patient Instructions (Signed)
Phillip Clements will call about your referral. We'll contact you with your xray report. Recheck A1c before a visit in 3 months.   Restart the mealtime insulin as soon as you are able.  Use hycodan as directed and claritin 10mg  a day for the cough.  Limit exertion in the meantime.  Use the nitroglycerin if needed for chest pain.

## 2014-03-29 NOTE — Assessment & Plan Note (Signed)
A1c similar to prev. We're trying to address the cough and CP acutely; his DM2 has been a chronic issue and we'll try to add back the mealtime insulin when possible, when he can focus on that.  His exercise routine has been greatly diminished in the meantime, affecting his sugar, discussed.

## 2014-03-29 NOTE — Progress Notes (Signed)
Pre visit review using our clinic review tool, if applicable. No additional management support is needed unless otherwise documented below in the visit note.  Not sleeping well.  Coughing is disrupting his sleep, started in the last month when he got sick.  No sputum usually, occ some sputum.  Ticking in his throat usually causes the cough.  ST frequently but usually no pain with swallowing.  Some post nasal gtt.  He's having more cough with his meals, recently. The cough doesn't seem to originate in his chest.  Still unclear if environmental, new duct work to be put in soon at work, he has been avoiding that area at work.  Hydrocodone did work some for the cough.  The cough is slowly getting better.   He wasn't worried about his DM2, due to the cough.  Discussed.  He was off his mealtime insulin.  No recent sugar checks.  A1c still up, d/w pt.  Compliant with lantus dose.   He is getting chest pains across his chest bilaterally with or with without exertion, not from the cough.  He can't walk a block now, unless he went really slowly.  No CP now. He can take a deep breath during the pain, hold his breath, and that will help the pain.   Prev with neg ETT 2011.    PMH and SH reviewed  ROS: See HPI, otherwise noncontributory.  Meds, vitals, and allergies reviewed.   GEN: nad, alert and oriented, obese HEENT: mucous membranes moist NECK: supple w/o LA CV: rrr. PULM: ctab, no inc wob ABD: soft, +bs EXT: no edema SKIN: no acute rash

## 2014-03-29 NOTE — Assessment & Plan Note (Signed)
With abnormal R wave progression V1-3, unclear significance.  No CP now. Given rx for NTG for prn use and refer back to cards.  D/w pt.

## 2014-03-29 NOTE — Assessment & Plan Note (Signed)
Improving slowly, likely either postinfectious or environmental (with intentional trigger avoidance).  Duct work to be replaced soon at work.  Restart hycodan with cautions on use, dosing.  D/w pt.  Okay for outpatient f/u.  See notes on CXR. >25 minutes spent in face to face time with patient, >50% spent in counselling or coordination of care.

## 2014-03-30 ENCOUNTER — Other Ambulatory Visit: Payer: Self-pay | Admitting: *Deleted

## 2014-03-30 MED ORDER — SITAGLIPTIN PHOSPHATE 50 MG PO TABS: 50.0000 mg | ORAL_TABLET | Freq: Every day | ORAL | Status: DC

## 2014-04-12 ENCOUNTER — Ambulatory Visit (INDEPENDENT_AMBULATORY_CARE_PROVIDER_SITE_OTHER): Payer: BC Managed Care – PPO | Admitting: Cardiovascular Disease

## 2014-04-12 ENCOUNTER — Encounter (INDEPENDENT_AMBULATORY_CARE_PROVIDER_SITE_OTHER): Payer: Self-pay

## 2014-04-12 ENCOUNTER — Encounter: Payer: Self-pay | Admitting: Cardiovascular Disease

## 2014-04-12 VITALS — BP 145/79 | HR 72 | Ht 69.0 in | Wt 258.2 lb

## 2014-04-12 DIAGNOSIS — E7800 Pure hypercholesterolemia, unspecified: Secondary | ICD-10-CM

## 2014-04-12 DIAGNOSIS — I209 Angina pectoris, unspecified: Secondary | ICD-10-CM

## 2014-04-12 DIAGNOSIS — R55 Syncope and collapse: Secondary | ICD-10-CM

## 2014-04-12 DIAGNOSIS — R079 Chest pain, unspecified: Secondary | ICD-10-CM

## 2014-04-12 NOTE — Patient Instructions (Addendum)
[place]  Your caregiver has ordered a Stress Test with nuclear imaging. The purpose of this test is to evaluate the blood supply to your heart muscle. This procedure is referred to as a "Non-Invasive Stress Test." This is because other than having an IV started in your vein, nothing is inserted or "invades" your body. Cardiac stress tests are done to find areas of poor blood flow to the heart by determining the extent of coronary artery disease (CAD). Some patients exercise on a treadmill, which naturally increases the blood flow to your heart, while others who are  unable to walk on a treadmill due to physical limitations have a pharmacologic/chemical stress agent called Lexiscan . This medicine will mimic walking on a treadmill by temporarily increasing your coronary blood flow.   Please note: these test may take anywhere between 2-4 hours to complete  PLEASE REPORT TO [place] AT THE FIRST DESK WILL DIRECT YOU WHERE TO GO  Date of Procedure:_______09/01/15______________________________  Arrival Time for Procedure:_____0945_________________________  Instructions regarding medication:   __x__ : Hold diabetes medication morning of procedure    PLEASE NOTIFY THE OFFICE AT LEAST 24 HOURS IN ADVANCE IF YOU ARE UNABLE TO KEEP YOUR APPOINTMENT.  [phone] AND  PLEASE NOTIFY NUCLEAR MEDICINE AT [facility] AT LEAST 24 HOURS IN ADVANCE IF YOU ARE UNABLE TO KEEP YOUR APPOINTMENT. [phone]  How to prepare for your Myoview test:  1. Do not eat or drink after midnight 2. No caffeine for 24 hours prior to test 3. No smoking 24 hours prior to test. 4. Your medication may be taken with water.  If your doctor stopped a medication because of this test, do not take that medication. 5. Ladies, please do not wear dresses.  Skirts or pants are appropriate. Please wear a short sleeve shirt. 6. No perfume, cologne or lotion. 7. Wear comfortable walking shoes. No  heels!

## 2014-04-12 NOTE — Progress Notes (Signed)
Primary care physician: Dr. Damita Dunnings  HPI  This is a pleasant 63 year old man who was referred for evaluation of chest pain. He has no previous cardiac history. He has known history of diabetes for at least 25 years of duration which has not been optimally controlled. He also has other medical conditions that include  Hyperlipidemia, tobacco use, obesity and prostate cancer. He describes progressive symptoms of substernal chest tightness radiating to his left arm which started about 6 months ago. This has been associated with exertional dyspnea. He cut down on his activities due to that. He denies discomfort at rest. He reports a brief syncopal episode last night while he was sitting in chair. He did have associated sweating but there was no chest pain, palpitations or shortness of breath. He quit smoking one month ago. He has extensive family history of coronary artery disease .   Allergies  Allergen Reactions  . Metformin And Related Nausea Only  . Statins     REACTION: JOINT ACHES     Current Outpatient Prescriptions on File Prior to Visit  Medication Sig Dispense Refill  . albuterol (PROVENTIL HFA;VENTOLIN HFA) 108 (90 BASE) MCG/ACT inhaler Inhale 1-2 puffs into the lungs every 6 (six) hours as needed for wheezing or shortness of breath.  1 Inhaler  1  . aspirin 81 MG EC tablet Take 81 mg by mouth daily.        . bicalutamide (CASODEX) 50 MG tablet Take 50 mg by mouth daily.      Marland Kitchen glucose blood test strip by Other route. Use daily as ordered. Code: 250.00       . HYDROcodone-homatropine (HYCODAN) 5-1.5 MG/5ML syrup Take 5 mLs by mouth every 8 (eight) hours as needed for cough (sedation caution).  120 mL  0  . insulin glargine (LANTUS) 100 UNIT/ML injection Inject 1.5 mLs (150 Units total) into the skin daily. Disp 15 pens  45 mL  12  . Insulin Pen Needle 31G X 8 MM MISC Use as directed to inject blood sugar twice daily dx:250.00  100 each  12  . leuprolide, 6 Month, (LEUPROLIDE ACETATE, 6  MONTH,) 45 MG injection Inject 45 mg into the skin every 6 (six) months.      Marland Kitchen lisinopril (ZESTRIL) 2.5 MG tablet Take 1 tablet (2.5 mg total) by mouth daily.  90 tablet  3  . nitroGLYCERIN (NITROSTAT) 0.4 MG SL tablet Place 1 tablet (0.4 mg total) under the tongue every 5 (five) minutes as needed for chest pain (max 3 doses).  25 tablet  3  . sitaGLIPtin (JANUVIA) 50 MG tablet Take 1 tablet (50 mg total) by mouth daily.  90 tablet  1   No current facility-administered medications on file prior to visit.     Past Medical History  Diagnosis Date  . Diabetes mellitus 2000    Type II  . Hypertension 2000  . Stroke     H/o pontine CVA, prev eval by Dr. Erling Cruz  . Environmental allergies   . Pneumonia     x3 as an adult-  last time 2009  . Arthritis   . 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 Hyperlipidemia   . History of chicken pox   . Syncope and collapse      Past Surgical History  Procedure Laterality Date  . Prostate biopsy  04/03/06 & 02/25/07    x2  . High intense focused ultrasound  07/20/06    By Dr. Jacqlyn Larsen  .  Cytoscopy prostatic stone o/w nml  06/21/08    Dr. Jacqlyn Larsen  . Ett myoview  09/13/09    Low risk EF 49%  . Anterior cervical decomp/discectomy fusion  08/25/2012    Procedure: ANTERIOR CERVICAL DECOMPRESSION/DISCECTOMY FUSION 2 LEVELS;  Surgeon: Hosie Spangle, MD;  Location: [place] [facility];  Service: Neurosurgery;  Laterality: N/A;  Cervical five-six Cervical six-seven anterior cervical decompression with fusion and plating and bonegraft     Family History  Problem Relation Age of Onset  . Diabetes Mother 89    DM  . Coronary artery disease Mother   . Hypertension Mother   . Heart attack Mother     CABG with MI in 2005  . Heart disease Mother     CV  . Hypertension Father   . Heart attack Father     CABG with Mi around 1997  . Coronary artery disease Father   . Heart disease Father     CV  . Heart attack Brother     MI/PTCA  . Heart disease Brother     CV   . Heart attack Maternal Grandfather     MI  . Prostate cancer Paternal Grandfather   . Breast cancer Neg Hx     Breast/ovarian/uterine cancer  . Colon cancer Neg Hx   . Depression Neg Hx   . Alcohol abuse Neg Hx     ETOH/drug abuse  . Stroke Neg Hx   . Diabetes Sister     DM     History   Social History  . Marital Status: Married    Spouse Name: N/A    Number of Children: 3  . Years of Education: N/A   Occupational History  . Insurance account manager    Social History Main Topics  . Smoking status: Former Smoker -- 1.00 packs/day for 30 years  . Smokeless tobacco: Never Used     Comment: quit 03/2014  . Alcohol Use: Yes     Comment: occassionally  . Drug Use: No  . Sexual Activity: Not on file   Other Topics Concern  . Not on file   Social History Narrative   Lives with wife.   2 daughters and 1 son.   [employer] fan   Runs [employer]     ROS A 10 point review of system was performed. It is negative other than that mentioned in the history of present illness.   PHYSICAL EXAM   BP 145/79  Pulse 72  Ht 5\' 9"  (1.753 m)  Wt 258 lb 4 oz (117.141 kg)  BMI 38.12 kg/m2 Constitutional: He is oriented to person, place, and time. He appears well-developed and well-nourished. No distress.  HENT: No nasal discharge.  Head: Normocephalic and atraumatic.  Eyes: Pupils are equal and round.  No discharge. Neck: Normal range of motion. Neck supple. No JVD present. No thyromegaly present.  Cardiovascular: Normal rate, regular rhythm, normal heart sounds. Exam reveals no gallop and no friction rub. No murmur heard.  Pulmonary/Chest: Effort normal and breath sounds normal. No stridor. No respiratory distress. He has no wheezes. He has no rales. He exhibits no tenderness.  Abdominal: Soft. Bowel sounds are normal. He exhibits no distension. There is no tenderness. There is no rebound and no guarding.  Musculoskeletal: Normal range of motion. He  exhibits no edema and no tenderness.  Neurological: He is alert and oriented to person, place, and time. Coordination normal.  Skin: Skin is warm and dry.  No rash noted. He is not diaphoretic. No erythema. No pallor.  Psychiatric: He has a normal mood and affect. His behavior is normal. Judgment and thought content normal.       XUX:YBFXO  Rhythm  -Anterior infarct -age undetermined.  Anterior T wave changes suggestive of ischemia ABNORMAL    ASSESSMENT AND PLAN

## 2014-04-13 ENCOUNTER — Ambulatory Visit: Payer: Self-pay | Admitting: Cardiovascular Disease

## 2014-04-13 ENCOUNTER — Encounter: Payer: Self-pay | Admitting: Cardiovascular Disease

## 2014-04-13 DIAGNOSIS — R079 Chest pain, unspecified: Secondary | ICD-10-CM

## 2014-04-13 DIAGNOSIS — I209 Angina pectoris, unspecified: Secondary | ICD-10-CM | POA: Insufficient documentation

## 2014-04-13 DIAGNOSIS — R55 Syncope and collapse: Secondary | ICD-10-CM | POA: Insufficient documentation

## 2014-04-13 NOTE — Assessment & Plan Note (Signed)
Lab Results  Component Value Date   CHOL 224* 03/23/2014   HDL 32.60* 03/23/2014   LDLCALC 153* 03/23/2014   LDLDIRECT 157.1 09/07/2013   TRIG 194.0* 03/23/2014   CHOLHDL 7 03/23/2014   The patient reports intolerance to atorvastatin with myalgia. He seems to be very hesitant to use any other statins. I will consider adding rosuvastatin upon followup.

## 2014-04-13 NOTE — Assessment & Plan Note (Signed)
The patient's symptoms are highly suggestive of class III angina. He has multiple risk factors for coronary artery disease. Baseline ECG is abnormal with anterior T wave changes and poor R wave progression suggestive of prior anterior infarct.  I requested a pharmacologic nuclear stress test to be done tomorrow for risk stratification. It is highly possible that the patient will require coronary angiography.  Continue aspirin for now.

## 2014-04-13 NOTE — Assessment & Plan Note (Signed)
The syncopal episode in the setting of angina symptoms is worrisome. I instructed him to stop driving and avoid any significant physical activities until cardiac workup is completed.

## 2014-04-14 ENCOUNTER — Other Ambulatory Visit: Payer: Self-pay

## 2014-04-14 ENCOUNTER — Other Ambulatory Visit: Payer: Self-pay | Admitting: Cardiovascular Disease

## 2014-04-14 ENCOUNTER — Telehealth: Payer: Self-pay | Admitting: *Deleted

## 2014-04-14 DIAGNOSIS — Z01812 Encounter for preprocedural laboratory examination: Secondary | ICD-10-CM

## 2014-04-14 DIAGNOSIS — R079 Chest pain, unspecified: Secondary | ICD-10-CM

## 2014-04-14 LAB — PROTIME-INR
INR: 1
Prothrombin Time: 12.6 secs (ref 11.5–14.7)

## 2014-04-14 LAB — CBC WITH DIFFERENTIAL/PLATELET
Basophil #: 0.1 10*3/uL (ref 0.0–0.1)
Basophil %: 0.8 %
Eosinophil #: 0.3 10*3/uL (ref 0.0–0.7)
Eosinophil %: 2.8 %
HCT: 41.5 % (ref 40.0–52.0)
HGB: 14.2 g/dL (ref 13.0–18.0)
Lymphocyte #: 1.9 10*3/uL (ref 1.0–3.6)
Lymphocyte %: 21.1 %
MCH: 29.2 pg (ref 26.0–34.0)
MCHC: 34.2 g/dL (ref 32.0–36.0)
MCV: 86 fL (ref 80–100)
Monocyte #: 0.6 x10 3/mm (ref 0.2–1.0)
Monocyte %: 6.2 %
Neutrophil #: 6.3 10*3/uL (ref 1.4–6.5)
Neutrophil %: 69.1 %
Platelet: 232 10*3/uL (ref 150–440)
RBC: 4.85 10*6/uL (ref 4.40–5.90)
RDW: 13.7 % (ref 11.5–14.5)
WBC: 9.1 10*3/uL (ref 3.8–10.6)

## 2014-04-14 NOTE — Telephone Encounter (Signed)
[facility] Cardiac Cath Instructions   You are scheduled for a Cardiac Cath on:______9/4/15___________________  Please arrive at __0930_____am on the day of your procedure  You will need to pre-register prior to the day of your procedure.  Enter through the [employer] at [facility].  Registration is the first desk on your right.  Please take the procedure order we have given you in order to be registered appropriately  Do not eat/drink anything after midnight  Someone will need to drive you home  It is recommended someone be with you for the first 24 hours after your procedure  Wear clothes that are easy to get on/off and wear slip on shoes if possible   Medications bring a current list of all medications with you    _x__ Do not take these medications before your procedure: Diabetes medications   Day of your procedure: Arrive at the [place] entrance.  Free valet service is available.  After entering the [place] please check-in at the registration desk (1st desk on your right) to receive your armband. After receiving your armband someone will escort you to the cardiac cath/special procedures waiting area.  The usual length of stay after your procedure is about 2 to 3 hours.  This can vary.  If you have any questions, please call our office at [phone], or you may call the cardiac cath lab at [facility] directly at [phone]   Your physician recommends that you return for lab work in:  Take lab orders to [facility] today for pre procedure labs

## 2014-04-15 ENCOUNTER — Other Ambulatory Visit: Payer: Self-pay

## 2014-04-15 DIAGNOSIS — R079 Chest pain, unspecified: Secondary | ICD-10-CM

## 2014-04-15 DIAGNOSIS — Z01812 Encounter for preprocedural laboratory examination: Secondary | ICD-10-CM

## 2014-04-15 LAB — BASIC METABOLIC PANEL
Anion Gap: 9 (ref 7–16)
BUN: 19 mg/dL — ABNORMAL HIGH (ref 7–18)
Calcium, Total: 8.7 mg/dL (ref 8.5–10.1)
Chloride: 100 mmol/L (ref 98–107)
Co2: 27 mmol/L (ref 21–32)
Creatinine: 0.91 mg/dL (ref 0.60–1.30)
EGFR (African American): >60
EGFR (Non-African Amer.): >60
Glucose: 310 mg/dL — ABNORMAL HIGH (ref 65–99)
Osmolality: 286 (ref 275–301)
Potassium: 4.5 mmol/L (ref 3.5–5.1)
Sodium: 136 mmol/L (ref 136–145)

## 2014-04-15 NOTE — Telephone Encounter (Signed)
Faxed pre cath orders  Phillip Clements confirmed rect

## 2014-04-16 ENCOUNTER — Ambulatory Visit: Payer: Self-pay | Admitting: Cardiovascular Disease

## 2014-04-16 ENCOUNTER — Other Ambulatory Visit: Payer: Self-pay

## 2014-04-16 DIAGNOSIS — I2510 Atherosclerotic heart disease of native coronary artery without angina pectoris: Secondary | ICD-10-CM

## 2014-04-16 DIAGNOSIS — R079 Chest pain, unspecified: Secondary | ICD-10-CM

## 2014-04-16 DIAGNOSIS — Z01812 Encounter for preprocedural laboratory examination: Secondary | ICD-10-CM

## 2014-04-20 ENCOUNTER — Telehealth: Payer: Self-pay | Admitting: *Deleted

## 2014-04-20 ENCOUNTER — Encounter: Payer: BC Managed Care – PPO | Admitting: Cardiothoracic Surgery

## 2014-04-20 NOTE — Telephone Encounter (Signed)
[name] called with Dr. Darcey Nora Cardiac Surgeon,  04/21/14 @ 9:15. Please fax cath report to [phone]

## 2014-04-20 NOTE — Telephone Encounter (Signed)
Patient's wife called regarding referral to a surgeon for heart surgery per Dr. Fletcher Anon. Please call

## 2014-04-20 NOTE — Telephone Encounter (Signed)
Spoke with patient informed him that Dr. Corliss Parish office will be calling him to reschedule his appt for this week  Patient verbalized understanding

## 2014-04-21 ENCOUNTER — Other Ambulatory Visit: Payer: Self-pay

## 2014-04-21 ENCOUNTER — Encounter (HOSPITAL_COMMUNITY): Payer: BC Managed Care – PPO

## 2014-04-21 ENCOUNTER — Encounter: Payer: Self-pay | Admitting: Cardiovascular Disease

## 2014-04-21 ENCOUNTER — Institutional Professional Consult (permissible substitution) (INDEPENDENT_AMBULATORY_CARE_PROVIDER_SITE_OTHER): Payer: BC Managed Care – PPO | Admitting: Cardiothoracic Surgery

## 2014-04-21 ENCOUNTER — Encounter: Payer: Self-pay | Admitting: Cardiothoracic Surgery

## 2014-04-21 VITALS — BP 123/81 | HR 81 | Ht 69.0 in | Wt 258.0 lb

## 2014-04-21 DIAGNOSIS — I2510 Atherosclerotic heart disease of native coronary artery without angina pectoris: Secondary | ICD-10-CM

## 2014-04-21 DIAGNOSIS — I209 Angina pectoris, unspecified: Secondary | ICD-10-CM

## 2014-04-21 DIAGNOSIS — I25119 Atherosclerotic heart disease of native coronary artery with unspecified angina pectoris: Secondary | ICD-10-CM

## 2014-04-21 NOTE — Progress Notes (Signed)
PCP is Elsie Stain, MD Referring Provider is Wellington Hampshire, MD  Chief Complaint  Patient presents with  . NEW CARDIAC    EVAL CABG    HPI: Patient referred for class III-class for angina with positive myocardial perfusion study. Subsequent cardiac catheterization by Dr.arita demonstrates total occlusion the RCA, 90% stenosis of the proximal LAD diagonal, 80% stenosis of the OM with EF of 45%. LVEDP is 12.  Patient's risk factors include diabetes mellitus, smoking history but quit 6 weeks ago, positive family history-both parents had CABG, and hypertension with obesity.  He denies any history of peripheral vascular disease but did have a TIA-lacunar stroke without significant residual. No history DVT claudication or varicosities of the leg veins.  Patient has significant pulmonary disease-sleep apnea on a mask for 3 years, recurrent bronchitis, and long history smoking. No recent symptoms of bronchitis or sinusitis.  2 weeks ago he was at home were canceled [place] and collapsed on the floor was found by his wife. There is no clear etiology. His blood sugar was normal. He did not have any preceding chest pain or loss of bladder control or neurologic residual. Echocardiogram shows no aortic stenosis. Past Medical History  Diagnosis Date  . Diabetes mellitus 2000    Type II  . Hypertension 2000  . Stroke     H/o pontine CVA, prev eval by Dr. Erling Cruz  . Environmental allergies   . Pneumonia     x3 as an adult-  last time 2009  . Arthritis   . 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 Hyperlipidemia   . History of chicken pox   . Syncope and collapse     Past Surgical History  Procedure Laterality Date  . Prostate biopsy  04/03/06 & 02/25/07    x2  . High intense focused ultrasound  07/20/06    By Dr. Jacqlyn Larsen  . Cytoscopy prostatic stone o/w nml  06/21/08    Dr. Jacqlyn Larsen  . Ett myoview  09/13/09    Low risk EF 49%  . Anterior cervical decomp/discectomy fusion  08/25/2012    Procedure:  ANTERIOR CERVICAL DECOMPRESSION/DISCECTOMY FUSION 2 LEVELS;  Surgeon: Hosie Spangle, MD;  Location: [place] [facility];  Service: Neurosurgery;  Laterality: N/A;  Cervical five-six Cervical six-seven anterior cervical decompression with fusion and plating and bonegraft    Family History  Problem Relation Age of Onset  . Diabetes Mother 2    DM  . Coronary artery disease Mother   . Hypertension Mother   . Heart attack Mother     CABG with MI in 2005  . Heart disease Mother     CV  . Hypertension Father   . Heart attack Father     CABG with Mi around 1997  . Coronary artery disease Father   . Heart disease Father     CV  . Heart attack Brother     MI/PTCA  . Heart disease Brother     CV  . Heart attack Maternal Grandfather     MI  . Prostate cancer Paternal Grandfather   . Breast cancer Neg Hx     Breast/ovarian/uterine cancer  . Colon cancer Neg Hx   . Depression Neg Hx   . Alcohol abuse Neg Hx     ETOH/drug abuse  . Stroke Neg Hx   . Diabetes Sister     DM    Social History History  Substance Use Topics  . Smoking status: Former  Smoker -- 1.00 packs/day for 30 years  . Smokeless tobacco: Never Used     Comment: quit 03/2014  . Alcohol Use: Yes     Comment: occassionally    Current Outpatient Prescriptions  Medication Sig Dispense Refill  . aspirin 81 MG EC tablet Take 81 mg by mouth daily.        . bicalutamide (CASODEX) 50 MG tablet Take 50 mg by mouth daily.      Marland Kitchen glucose blood test strip by Other route. Use daily as ordered. Code: 250.00       . insulin glargine (LANTUS) 100 UNIT/ML injection Inject 1.5 mLs (150 Units total) into the skin daily. Disp 15 pens  45 mL  12  . Insulin Pen Needle 31G X 8 MM MISC Use as directed to inject blood sugar twice daily dx:250.00  100 each  12  . leuprolide, 6 Month, (LEUPROLIDE ACETATE, 6 MONTH,) 45 MG injection Inject 45 mg into the skin every 6 (six) months.      Marland Kitchen lisinopril (ZESTRIL) 2.5 MG tablet Take 1 tablet (2.5 mg  total) by mouth daily.  90 tablet  3  . sitaGLIPtin (JANUVIA) 50 MG tablet Take 1 tablet (50 mg total) by mouth daily.  90 tablet  1  . albuterol (PROVENTIL HFA;VENTOLIN HFA) 108 (90 BASE) MCG/ACT inhaler Inhale 1-2 puffs into the lungs every 6 (six) hours as needed for wheezing or shortness of breath.  1 Inhaler  1  . HYDROcodone-homatropine (HYCODAN) 5-1.5 MG/5ML syrup Take 5 mLs by mouth every 8 (eight) hours as needed for cough (sedation caution).  120 mL  0  . nitroGLYCERIN (NITROSTAT) 0.4 MG SL tablet Place 1 tablet (0.4 mg total) under the tongue every 5 (five) minutes as needed for chest pain (max 3 doses).  25 tablet  3   No current facility-administered medications for this visit.    Allergies  Allergen Reactions  . Metformin And Related Nausea Only  . Statins     REACTION: JOINT ACHES    Review of Systems  No history weight loss or fever No history of thoracic trauma, rib fracture or pneumothorax No active symptoms of bronchitis Previous neck surgery for cervical spine disease and previous noninvasive therapy of prostate cancer now being treated for bone metastases Lupron injections and and oral anti-androgen medication Diabetes and poor control with A1c greater than 9.0             BP 123/81  Pulse 81  Ht 5\' 9"  (1.753 m)  Wt 258 lb (117.028 kg)  BMI 38.08 kg/m2  SpO2 98% Physical Exam Middle-aged obese Caucasian male Alert responsive but depressed HEENT normal cephalic, pupils equal Neck without JVD adenopathy or bruit Thorax with distant breath sounds Cardiac regular rhythm without murmur or gallop Abdomen soft obese no palpable pulsatile mass Extremities-atrophic skin changes of the pretibial skin with stasis dermatitis Vascular palpable radial and femoral pulses nonpalpable pedal pulses-no varicose veins Neurologic alert responsive no focal motor deficit cranial nerves grossly intact  Diagnostic Tests: Coronary angiogram reviewed 2-D echocardiogram  pending Chest x-ray reviewed  Impression:  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  Plan grafts to LAD, OM, PDA, and diagonal. Procedure indications benefits and risks discussed with patient and wife. He understands and agrees to proceed. Plan: CABG September 14 at [place]. We'll get head CT scan prior to surgery because of his syncopal event 2 weeks ago.

## 2014-04-21 NOTE — Telephone Encounter (Signed)
Faxed at 8:33am.

## 2014-04-22 ENCOUNTER — Encounter (HOSPITAL_COMMUNITY)
Admission: RE | Admit: 2014-04-22 | Discharge: 2014-04-22 | Disposition: A | Discharge status: Home / Self Care | Payer: BC Managed Care – PPO | Source: Ambulatory Visit | Attending: Cardiothoracic Surgery | Admitting: Cardiothoracic Surgery

## 2014-04-22 ENCOUNTER — Encounter (HOSPITAL_COMMUNITY): Payer: Self-pay

## 2014-04-22 ENCOUNTER — Ambulatory Visit (HOSPITAL_COMMUNITY)
Admission: RE | Admit: 2014-04-22 | Discharge: 2014-04-22 | Disposition: A | Discharge status: Home / Self Care | Payer: BC Managed Care – PPO | Source: Ambulatory Visit | Attending: Cardiothoracic Surgery | Admitting: Cardiothoracic Surgery

## 2014-04-22 ENCOUNTER — Other Ambulatory Visit: Payer: Self-pay | Admitting: *Deleted

## 2014-04-22 VITALS — BP 144/57 | HR 71 | Temp 97.9°F | Resp 20 | Ht 69.0 in | Wt 258.0 lb

## 2014-04-22 DIAGNOSIS — Z8673 Personal history of transient ischemic attack (TIA), and cerebral infarction without residual deficits: Secondary | ICD-10-CM | POA: Insufficient documentation

## 2014-04-22 DIAGNOSIS — R55 Syncope and collapse: Secondary | ICD-10-CM | POA: Diagnosis not present

## 2014-04-22 DIAGNOSIS — Z981 Arthrodesis status: Secondary | ICD-10-CM | POA: Diagnosis not present

## 2014-04-22 DIAGNOSIS — M47814 Spondylosis without myelopathy or radiculopathy, thoracic region: Secondary | ICD-10-CM | POA: Insufficient documentation

## 2014-04-22 DIAGNOSIS — Z8546 Personal history of malignant neoplasm of prostate: Secondary | ICD-10-CM | POA: Insufficient documentation

## 2014-04-22 DIAGNOSIS — I6529 Occlusion and stenosis of unspecified carotid artery: Secondary | ICD-10-CM | POA: Insufficient documentation

## 2014-04-22 DIAGNOSIS — E119 Type 2 diabetes mellitus without complications: Secondary | ICD-10-CM | POA: Insufficient documentation

## 2014-04-22 DIAGNOSIS — E669 Obesity, unspecified: Secondary | ICD-10-CM | POA: Insufficient documentation

## 2014-04-22 DIAGNOSIS — I10 Essential (primary) hypertension: Secondary | ICD-10-CM | POA: Insufficient documentation

## 2014-04-22 DIAGNOSIS — I517 Cardiomegaly: Secondary | ICD-10-CM | POA: Diagnosis not present

## 2014-04-22 DIAGNOSIS — I2510 Atherosclerotic heart disease of native coronary artery without angina pectoris: Secondary | ICD-10-CM

## 2014-04-22 DIAGNOSIS — E785 Hyperlipidemia, unspecified: Secondary | ICD-10-CM | POA: Diagnosis not present

## 2014-04-22 DIAGNOSIS — I379 Nonrheumatic pulmonary valve disorder, unspecified: Secondary | ICD-10-CM

## 2014-04-22 DIAGNOSIS — I6789 Other cerebrovascular disease: Secondary | ICD-10-CM | POA: Insufficient documentation

## 2014-04-22 DIAGNOSIS — Z87891 Personal history of nicotine dependence: Secondary | ICD-10-CM | POA: Diagnosis not present

## 2014-04-22 DIAGNOSIS — G259 Extrapyramidal and movement disorder, unspecified: Secondary | ICD-10-CM | POA: Insufficient documentation

## 2014-04-22 DIAGNOSIS — Z01810 Encounter for preprocedural cardiovascular examination: Secondary | ICD-10-CM | POA: Diagnosis present

## 2014-04-22 HISTORY — DX: Atherosclerotic heart disease of native coronary artery without angina pectoris: I25.10

## 2014-04-22 LAB — BLOOD GAS, ARTERIAL
Acid-base deficit: 2 mmol/L (ref 0.0–2.0)
Bicarbonate: 22.3 mEq/L (ref 20.0–24.0)
Drawn by: 206361
FIO2: 0.21 %
O2 Saturation: 95.9 %
Patient temperature: 98.6
TCO2: 23.5 mmol/L (ref 0–100)
pCO2 arterial: 38.3 mmHg (ref 35.0–45.0)
pH, Arterial: 7.383 (ref 7.350–7.450)
pO2, Arterial: 79 mmHg — ABNORMAL LOW (ref 80.0–100.0)

## 2014-04-22 LAB — CBC
HCT: 41 % (ref 39.0–52.0)
Hemoglobin: 14.6 g/dL (ref 13.0–17.0)
MCH: 29.4 pg (ref 26.0–34.0)
MCHC: 35.6 g/dL (ref 30.0–36.0)
MCV: 82.7 fL (ref 78.0–100.0)
Platelets: 191 10*3/uL (ref 150–400)
RBC: 4.96 MIL/uL (ref 4.22–5.81)
RDW: 13 % (ref 11.5–15.5)
WBC: 8.2 10*3/uL (ref 4.0–10.5)

## 2014-04-22 LAB — COMPREHENSIVE METABOLIC PANEL
ALT: 17 U/L (ref 0–53)
AST: 12 U/L (ref 0–37)
Albumin: 3.7 g/dL (ref 3.5–5.2)
Alkaline Phosphatase: 93 U/L (ref 39–117)
Anion gap: 17 — ABNORMAL HIGH (ref 5–15)
BUN: 22 mg/dL (ref 6–23)
CO2: 18 mEq/L — ABNORMAL LOW (ref 19–32)
Calcium: 9.2 mg/dL (ref 8.4–10.5)
Chloride: 97 mEq/L (ref 96–112)
Creatinine, Ser: 0.79 mg/dL (ref 0.50–1.35)
GFR calc Af Amer: >90 mL/min (ref >90)
GFR calc non Af Amer: >90 mL/min (ref >90)
Glucose, Bld: 328 mg/dL — ABNORMAL HIGH (ref 70–99)
Potassium: 4.8 mEq/L (ref 3.7–5.3)
Sodium: 132 mEq/L — ABNORMAL LOW (ref 137–147)
Total Bilirubin: 0.5 mg/dL (ref 0.3–1.2)
Total Protein: 7 g/dL (ref 6.0–8.3)

## 2014-04-22 LAB — URINALYSIS, ROUTINE W REFLEX MICROSCOPIC
Bilirubin Urine: NEGATIVE
Glucose, UA: >1000 mg/dL — AB
Hgb urine dipstick: NEGATIVE
Ketones, ur: NEGATIVE mg/dL
Leukocytes, UA: NEGATIVE
Nitrite: NEGATIVE
Protein, ur: 100 mg/dL — AB
Specific Gravity, Urine: 1.027 (ref 1.005–1.030)
Urobilinogen, UA: 0.2 mg/dL (ref 0.0–1.0)
pH: 5.5 (ref 5.0–8.0)

## 2014-04-22 LAB — PULMONARY FUNCTION TEST
DL/VA % pred: 114 %
DL/VA: 5.23 ml/min/mmHg/L
DLCO cor % pred: 81 %
DLCO cor: 25.19 ml/min/mmHg
DLCO unc % pred: 81 %
DLCO unc: 25.19 ml/min/mmHg
FEF 25-75 Post: 4.27 L/sec
FEF 25-75 Pre: 3.09 L/sec
FEF2575-%Change-Post: 38 %
FEF2575-%Pred-Post: 155 %
FEF2575-%Pred-Pre: 112 %
FEV1-%Change-Post: 7 %
FEV1-%Pred-Post: 89 %
FEV1-%Pred-Pre: 83 %
FEV1-Post: 3.03 L
FEV1-Pre: 2.83 L
FEV1FVC-%Change-Post: 3 %
FEV1FVC-%Pred-Pre: 107 %
FEV6-%Change-Post: 4 %
FEV6-%Pred-Post: 84 %
FEV6-%Pred-Pre: 81 %
FEV6-Post: 3.63 L
FEV6-Pre: 3.49 L
FEV6FVC-%Change-Post: 0 %
FEV6FVC-%Pred-Post: 105 %
FEV6FVC-%Pred-Pre: 105 %
FVC-%Change-Post: 3 %
FVC-%Pred-Post: 80 %
FVC-%Pred-Pre: 77 %
FVC-Post: 3.63 L
FVC-Pre: 3.49 L
Post FEV1/FVC ratio: 83 %
Post FEV6/FVC ratio: 100 %
Pre FEV1/FVC ratio: 81 %
Pre FEV6/FVC Ratio: 100 %
RV % pred: 87 %
RV: 1.96 L
TLC % pred: 82 %
TLC: 5.61 L

## 2014-04-22 LAB — SURGICAL PCR SCREEN
MRSA, PCR: POSITIVE — AB
Staphylococcus aureus: POSITIVE — AB

## 2014-04-22 LAB — ABO/RH: ABO/RH(D): A POS

## 2014-04-22 LAB — URINE MICROSCOPIC-ADD ON

## 2014-04-22 LAB — PROTIME-INR
INR: 1.02 (ref 0.00–1.49)
Prothrombin Time: 13.4 seconds (ref 11.6–15.2)

## 2014-04-22 LAB — APTT: aPTT: 30 seconds (ref 24–37)

## 2014-04-22 LAB — HEMOGLOBIN A1C
Hgb A1c MFr Bld: 9.6 % — ABNORMAL HIGH (ref <5.7)
Mean Plasma Glucose: 229 mg/dL — ABNORMAL HIGH (ref <117)

## 2014-04-22 MED ORDER — ALBUTEROL SULFATE (2.5 MG/3ML) 0.083% IN NEBU
2.5000 mg | INHALATION_SOLUTION | Freq: Once | RESPIRATORY_TRACT | Status: AC
  Administered 2014-04-22: 2.5 mg via RESPIRATORY_TRACT

## 2014-04-22 NOTE — Pre-Procedure Instructions (Addendum)
RAYVON DAKIN Sr.  04/22/2014   Your procedure is scheduled on:  Monday, September 14.  Report to [facility] Admitting at 6:15 AM.  Call this number if you have problems the morning of surgery: [phone]   Remember:   Do not eat food or drink liquids after midnight.   Take these medicines the morning of surgery with A SIP OF WATER: nitroGLYCERIN (NITROSTAT).   Do not wear jewelry, make-up or nail polish.  Do not wear lotions, powders, or perfumes.             Men may shave face and neck.            Do not bring valuables to the hospital.              [facility] is not responsible  for any belongings or valuables.                Bring CAPD mask and Incentive Spirometry.              Contacts, dentures or bridgework may not be worn into surgery.  Leave suitcase in the car. After surgery it may be brought to your room.  For patients admitted to the hospital, discharge time is determined by your  treatment team.               Special Instructions: Review  [place] - Preparing For Surgery.   Please read over the following fact sheets that you were given: Pain Booklet, Coughing and Deep Breathing, Blood Transfusion Information and Surgical Site Infection Prevention

## 2014-04-22 NOTE — Progress Notes (Signed)
I called a Mupricion prescription to [place].

## 2014-04-22 NOTE — Progress Notes (Signed)
  Echocardiogram 2D Echocardiogram has been performed.  Phillip Clements 04/22/2014, 12:28 PM

## 2014-04-22 NOTE — Progress Notes (Signed)
Phillip Clements reports fasting blood sugars run between 90 and 200.  Last Hgb A1C was 9.2

## 2014-04-23 ENCOUNTER — Ambulatory Visit (HOSPITAL_COMMUNITY)
Admission: RE | Admit: 2014-04-23 | Discharge: 2014-04-23 | Disposition: A | Discharge status: Home / Self Care | Payer: BC Managed Care – PPO | Source: Ambulatory Visit | Attending: Cardiothoracic Surgery | Admitting: Cardiothoracic Surgery

## 2014-04-23 ENCOUNTER — Encounter (HOSPITAL_COMMUNITY): Payer: Self-pay

## 2014-04-23 DIAGNOSIS — R55 Syncope and collapse: Secondary | ICD-10-CM | POA: Diagnosis present

## 2014-04-23 MED ORDER — IOHEXOL 300 MG/ML  SOLN
100.0000 mL | Freq: Once | INTRAMUSCULAR | Status: AC | PRN
  Administered 2014-04-23: 100 mL via INTRAVENOUS

## 2014-04-23 NOTE — Progress Notes (Signed)
Anesthesia chart review: Patient is a 63 year old male scheduled for CABG on 04/26/14 by Dr. Prescott Gum.   History includes CAD, syncope 03/2014, former smoker, DM2, HTN, prostate cancer with metastasis to neck (on leuprolide adn bicalutamide), CVA, HLD, arthritis, SOB, PNA, former smoker '15, ACDF '14. PAT RN handwritten notations also mention a history of OSA with CPAP use (it's not documented in Epic however, so I've asked this to be clarified on the day of surgery and updated in Epic as appropriate.). BMI is consistent with obesity.  PCP is Dr. Elsie Stain.  Cardiologist is Dr. Fletcher Anon.   EKG on 04/12/14 showed SR, anterior infarct (age undetermined), anterior T wave abnormality, consider ischemia.   Echo on 04/22/14 showed:  - Left ventricle: Posterior basal akinesis. The cavity size was normal. Wall thickness was increased in a pattern of moderate LVH. Systolic function was normal. The estimated ejection fraction was in the range of 50% to 55%. - Mitral valve: Degree of MR not well characterized but likely mild to moderae Likely ischemic given RWMA. - Left atrium: The atrium was mildly dilated. - Atrial septum: No defect or patent foramen ovale was identified.  Cardiac cath on 04/16/14 [facility]; done following abnormal stress test) showed severe 3V CAD with moderate LM stenosis. EF 45% (see report scanned under Results review tab).  Carotid duplex on 04/22/14 showed: BIlateral: intimal wall thickening CCA. Mild mixed plaque origin ICA. 1-39% ICA stenosis. Vertebral artery flow is antegrade.  He is scheduled for a head CT this afternoon for a more complete work-up for his syncopal episode least month.  There was no AS by echo. (Update: CT of the head showed: Mild chronic microvascular ischemic change in the white matter. No  acute abnormality."  CXR on 04/22/14 showed: 1. Mild cardiomegaly, no CHF. 2. No acute cardiopulmonary disease.   Preoperative PFTs and labs noted.  Glucose 328 with mean 229 (A1C  of 9.6). He will get a fasting CBG on arrival and perioperative Glucostabilizer. (He reported fasting glucose typically between 90 - 200.)  If glucose felt reasonable and otherwise no acute changes then I would anticipate he could proceed as planned.  [facility]/Anesthesiology Phone [phone] 04/23/2014 4:58 PM

## 2014-04-25 MED ORDER — DOPAMINE-DEXTROSE 3.2-5 MG/ML-% IV SOLN
2.0000 ug/kg/min | INTRAVENOUS | Status: AC
  Administered 2014-04-26: 3 ug/min via INTRAVENOUS
  Filled 2014-04-25: qty 250

## 2014-04-25 MED ORDER — MAGNESIUM SULFATE 50 % IJ SOLN
40.0000 meq | INTRAMUSCULAR | Status: DC
  Filled 2014-04-25: qty 10

## 2014-04-25 MED ORDER — DEXTROSE 5 % IV SOLN
750.0000 mg | INTRAVENOUS | Status: DC
  Filled 2014-04-25 (×2): qty 750

## 2014-04-25 MED ORDER — NITROGLYCERIN IN D5W 200-5 MCG/ML-% IV SOLN
2.0000 ug/min | INTRAVENOUS | Status: DC
  Filled 2014-04-25: qty 250

## 2014-04-25 MED ORDER — METOPROLOL TARTRATE 12.5 MG HALF TABLET
12.5000 mg | ORAL_TABLET | Freq: Once | ORAL | Status: AC
  Administered 2014-04-26: 12.5 mg via ORAL
  Filled 2014-04-25: qty 1

## 2014-04-25 MED ORDER — POTASSIUM CHLORIDE 2 MEQ/ML IV SOLN
80.0000 meq | INTRAVENOUS | Status: DC
  Filled 2014-04-25: qty 40

## 2014-04-25 MED ORDER — VANCOMYCIN HCL 10 G IV SOLR
1250.0000 mg | INTRAVENOUS | Status: AC
  Administered 2014-04-26: 1250 mg via INTRAVENOUS
  Filled 2014-04-25: qty 1250

## 2014-04-25 MED ORDER — CHLORHEXIDINE GLUCONATE 4 % EX LIQD
30.0000 mL | CUTANEOUS | Status: DC
  Filled 2014-04-25: qty 30

## 2014-04-25 MED ORDER — DEXTROSE 5 % IV SOLN
1.5000 g | INTRAVENOUS | Status: AC
  Administered 2014-04-26: .75 g via INTRAVENOUS
  Administered 2014-04-26: 1.5 g via INTRAVENOUS
  Filled 2014-04-25 (×3): qty 1.5

## 2014-04-25 MED ORDER — SODIUM CHLORIDE 0.9 % IV SOLN
INTRAVENOUS | Status: DC
  Filled 2014-04-25: qty 30

## 2014-04-25 MED ORDER — SODIUM CHLORIDE 0.9 % IV SOLN
INTRAVENOUS | Status: AC
  Administered 2014-04-26: 1 [IU]/h via INTRAVENOUS
  Filled 2014-04-25: qty 2.5

## 2014-04-25 MED ORDER — DEXMEDETOMIDINE HCL IN NACL 400 MCG/100ML IV SOLN
0.1000 ug/kg/h | INTRAVENOUS | Status: AC
  Administered 2014-04-26: 0.2 ug/kg/h via INTRAVENOUS
  Filled 2014-04-25: qty 100

## 2014-04-25 MED ORDER — SODIUM CHLORIDE 0.9 % IV SOLN
INTRAVENOUS | Status: AC
  Administered 2014-04-26: 69.8 mL/h via INTRAVENOUS
  Filled 2014-04-25: qty 40

## 2014-04-25 MED ORDER — PLASMA-LYTE 148 IV SOLN
INTRAVENOUS | Status: AC
  Administered 2014-04-26: 10:00:00
  Filled 2014-04-25: qty 2.5

## 2014-04-25 MED ORDER — PHENYLEPHRINE HCL 10 MG/ML IJ SOLN
30.0000 ug/min | INTRAMUSCULAR | Status: AC
  Administered 2014-04-26: 20 ug/min via INTRAVENOUS
  Filled 2014-04-25: qty 2

## 2014-04-25 MED ORDER — DEXTROSE 5 % IV SOLN
0.5000 ug/min | INTRAVENOUS | Status: DC
  Filled 2014-04-25: qty 4

## 2014-04-26 ENCOUNTER — Other Ambulatory Visit: Payer: BC Managed Care – PPO

## 2014-04-26 ENCOUNTER — Encounter (HOSPITAL_COMMUNITY)
Admission: RE | Disposition: A | Discharge status: Home / Self Care | Payer: BC Managed Care – PPO | Source: Ambulatory Visit | Attending: Cardiothoracic Surgery

## 2014-04-26 ENCOUNTER — Inpatient Hospital Stay (HOSPITAL_COMMUNITY): Payer: BC Managed Care – PPO | Admitting: Certified Registered Nurse Anesthetist

## 2014-04-26 ENCOUNTER — Encounter (HOSPITAL_COMMUNITY): Payer: BC Managed Care – PPO | Admitting: Vascular Surgery

## 2014-04-26 ENCOUNTER — Inpatient Hospital Stay (HOSPITAL_COMMUNITY): Payer: BC Managed Care – PPO

## 2014-04-26 ENCOUNTER — Inpatient Hospital Stay (HOSPITAL_COMMUNITY)
Admission: RE | Admit: 2014-04-26 | Discharge: 2014-05-03 | DRG: 236 | Disposition: A | Discharge status: Home / Self Care | Payer: BC Managed Care – PPO | Source: Ambulatory Visit | Attending: Cardiothoracic Surgery | Admitting: Cardiothoracic Surgery

## 2014-04-26 ENCOUNTER — Encounter (HOSPITAL_COMMUNITY): Payer: Self-pay | Admitting: Surgery

## 2014-04-26 DIAGNOSIS — Z8546 Personal history of malignant neoplasm of prostate: Secondary | ICD-10-CM

## 2014-04-26 DIAGNOSIS — Z8673 Personal history of transient ischemic attack (TIA), and cerebral infarction without residual deficits: Secondary | ICD-10-CM

## 2014-04-26 DIAGNOSIS — Z87891 Personal history of nicotine dependence: Secondary | ICD-10-CM | POA: Diagnosis not present

## 2014-04-26 DIAGNOSIS — E1169 Type 2 diabetes mellitus with other specified complication: Secondary | ICD-10-CM | POA: Diagnosis not present

## 2014-04-26 DIAGNOSIS — I2582 Chronic total occlusion of coronary artery: Secondary | ICD-10-CM | POA: Diagnosis present

## 2014-04-26 DIAGNOSIS — R110 Nausea: Secondary | ICD-10-CM | POA: Diagnosis not present

## 2014-04-26 DIAGNOSIS — I4891 Unspecified atrial fibrillation: Secondary | ICD-10-CM | POA: Diagnosis not present

## 2014-04-26 DIAGNOSIS — Z951 Presence of aortocoronary bypass graft: Secondary | ICD-10-CM

## 2014-04-26 DIAGNOSIS — I739 Peripheral vascular disease, unspecified: Secondary | ICD-10-CM | POA: Diagnosis present

## 2014-04-26 DIAGNOSIS — I10 Essential (primary) hypertension: Secondary | ICD-10-CM | POA: Diagnosis present

## 2014-04-26 DIAGNOSIS — Z7982 Long term (current) use of aspirin: Secondary | ICD-10-CM | POA: Diagnosis not present

## 2014-04-26 DIAGNOSIS — E785 Hyperlipidemia, unspecified: Secondary | ICD-10-CM | POA: Diagnosis present

## 2014-04-26 DIAGNOSIS — G4733 Obstructive sleep apnea (adult) (pediatric): Secondary | ICD-10-CM | POA: Diagnosis present

## 2014-04-26 DIAGNOSIS — Z79899 Other long term (current) drug therapy: Secondary | ICD-10-CM | POA: Diagnosis not present

## 2014-04-26 DIAGNOSIS — Z794 Long term (current) use of insulin: Secondary | ICD-10-CM

## 2014-04-26 DIAGNOSIS — Z6841 Body Mass Index (BMI) 40.0 and over, adult: Secondary | ICD-10-CM

## 2014-04-26 DIAGNOSIS — E8779 Other fluid overload: Secondary | ICD-10-CM | POA: Diagnosis not present

## 2014-04-26 DIAGNOSIS — D62 Acute posthemorrhagic anemia: Secondary | ICD-10-CM | POA: Diagnosis not present

## 2014-04-26 DIAGNOSIS — Z8249 Family history of ischemic heart disease and other diseases of the circulatory system: Secondary | ICD-10-CM | POA: Diagnosis not present

## 2014-04-26 DIAGNOSIS — I2510 Atherosclerotic heart disease of native coronary artery without angina pectoris: Secondary | ICD-10-CM | POA: Diagnosis present

## 2014-04-26 DIAGNOSIS — K560 Paralytic ileus: Secondary | ICD-10-CM | POA: Diagnosis not present

## 2014-04-26 DIAGNOSIS — I959 Hypotension, unspecified: Secondary | ICD-10-CM | POA: Diagnosis not present

## 2014-04-26 HISTORY — PX: CORONARY ARTERY BYPASS GRAFT: SHX141

## 2014-04-26 HISTORY — PX: INTRAOPERATIVE TRANSESOPHAGEAL ECHOCARDIOGRAM: SHX5062

## 2014-04-26 LAB — CBC
HCT: 29.2 % — ABNORMAL LOW (ref 39.0–52.0)
HCT: 26.9 % — ABNORMAL LOW (ref 39.0–52.0)
Hemoglobin: 10.6 g/dL — ABNORMAL LOW (ref 13.0–17.0)
Hemoglobin: 9.8 g/dL — ABNORMAL LOW (ref 13.0–17.0)
MCH: 29.9 pg (ref 26.0–34.0)
MCH: 29.3 pg (ref 26.0–34.0)
MCHC: 36.3 g/dL — ABNORMAL HIGH (ref 30.0–36.0)
MCHC: 36.4 g/dL — ABNORMAL HIGH (ref 30.0–36.0)
MCV: 82.3 fL (ref 78.0–100.0)
MCV: 80.5 fL (ref 78.0–100.0)
Platelets: 205 10*3/uL (ref 150–400)
Platelets: 208 10*3/uL (ref 150–400)
RBC: 3.55 MIL/uL — ABNORMAL LOW (ref 4.22–5.81)
RBC: 3.34 MIL/uL — ABNORMAL LOW (ref 4.22–5.81)
RDW: 12.6 % (ref 11.5–15.5)
RDW: 12.8 % (ref 11.5–15.5)
WBC: 18.5 10*3/uL — ABNORMAL HIGH (ref 4.0–10.5)
WBC: 13.5 10*3/uL — ABNORMAL HIGH (ref 4.0–10.5)

## 2014-04-26 LAB — POCT I-STAT, CHEM 8
BUN: 17 mg/dL (ref 6–23)
BUN: 18 mg/dL (ref 6–23)
BUN: 16 mg/dL (ref 6–23)
BUN: 17 mg/dL (ref 6–23)
BUN: 17 mg/dL (ref 6–23)
BUN: 15 mg/dL (ref 6–23)
BUN: 17 mg/dL (ref 6–23)
Calcium, Ion: 1.27 mmol/L (ref 1.13–1.30)
Calcium, Ion: 1.2 mmol/L (ref 1.13–1.30)
Calcium, Ion: 0.95 mmol/L — ABNORMAL LOW (ref 1.13–1.30)
Calcium, Ion: 1 mmol/L — ABNORMAL LOW (ref 1.13–1.30)
Calcium, Ion: 0.99 mmol/L — ABNORMAL LOW (ref 1.13–1.30)
Calcium, Ion: 0.98 mmol/L — ABNORMAL LOW (ref 1.13–1.30)
Calcium, Ion: 1.21 mmol/L (ref 1.13–1.30)
Chloride: 100 mEq/L (ref 96–112)
Chloride: 101 mEq/L (ref 96–112)
Chloride: 98 mEq/L (ref 96–112)
Chloride: 103 mEq/L (ref 96–112)
Chloride: 98 mEq/L (ref 96–112)
Chloride: 103 mEq/L (ref 96–112)
Chloride: 104 mEq/L (ref 96–112)
Creatinine, Ser: 0.8 mg/dL (ref 0.50–1.35)
Creatinine, Ser: 0.8 mg/dL (ref 0.50–1.35)
Creatinine, Ser: 0.7 mg/dL (ref 0.50–1.35)
Creatinine, Ser: 0.8 mg/dL (ref 0.50–1.35)
Creatinine, Ser: 0.7 mg/dL (ref 0.50–1.35)
Creatinine, Ser: 0.8 mg/dL (ref 0.50–1.35)
Creatinine, Ser: 0.9 mg/dL (ref 0.50–1.35)
Glucose, Bld: 281 mg/dL — ABNORMAL HIGH (ref 70–99)
Glucose, Bld: 232 mg/dL — ABNORMAL HIGH (ref 70–99)
Glucose, Bld: 207 mg/dL — ABNORMAL HIGH (ref 70–99)
Glucose, Bld: 190 mg/dL — ABNORMAL HIGH (ref 70–99)
Glucose, Bld: 206 mg/dL — ABNORMAL HIGH (ref 70–99)
Glucose, Bld: 205 mg/dL — ABNORMAL HIGH (ref 70–99)
Glucose, Bld: 224 mg/dL — ABNORMAL HIGH (ref 70–99)
HCT: 38 % — ABNORMAL LOW (ref 39.0–52.0)
HCT: 37 % — ABNORMAL LOW (ref 39.0–52.0)
HCT: 28 % — ABNORMAL LOW (ref 39.0–52.0)
HCT: 27 % — ABNORMAL LOW (ref 39.0–52.0)
HCT: 26 % — ABNORMAL LOW (ref 39.0–52.0)
HCT: 24 % — ABNORMAL LOW (ref 39.0–52.0)
HCT: 27 % — ABNORMAL LOW (ref 39.0–52.0)
Hemoglobin: 12.9 g/dL — ABNORMAL LOW (ref 13.0–17.0)
Hemoglobin: 12.6 g/dL — ABNORMAL LOW (ref 13.0–17.0)
Hemoglobin: 9.5 g/dL — ABNORMAL LOW (ref 13.0–17.0)
Hemoglobin: 9.2 g/dL — ABNORMAL LOW (ref 13.0–17.0)
Hemoglobin: 8.8 g/dL — ABNORMAL LOW (ref 13.0–17.0)
Hemoglobin: 8.2 g/dL — ABNORMAL LOW (ref 13.0–17.0)
Hemoglobin: 9.2 g/dL — ABNORMAL LOW (ref 13.0–17.0)
Potassium: 4.4 mEq/L (ref 3.7–5.3)
Potassium: 4.7 mEq/L (ref 3.7–5.3)
Potassium: 4.5 mEq/L (ref 3.7–5.3)
Potassium: 3.7 mEq/L (ref 3.7–5.3)
Potassium: 4.7 mEq/L (ref 3.7–5.3)
Potassium: 3.5 mEq/L — ABNORMAL LOW (ref 3.7–5.3)
Potassium: 4.5 mEq/L (ref 3.7–5.3)
Sodium: 133 mEq/L — ABNORMAL LOW (ref 137–147)
Sodium: 132 mEq/L — ABNORMAL LOW (ref 137–147)
Sodium: 133 mEq/L — ABNORMAL LOW (ref 137–147)
Sodium: 133 mEq/L — ABNORMAL LOW (ref 137–147)
Sodium: 133 mEq/L — ABNORMAL LOW (ref 137–147)
Sodium: 134 mEq/L — ABNORMAL LOW (ref 137–147)
Sodium: 135 mEq/L — ABNORMAL LOW (ref 137–147)
TCO2: 25 mmol/L (ref 0–100)
TCO2: 25 mmol/L (ref 0–100)
TCO2: 24 mmol/L (ref 0–100)
TCO2: 27 mmol/L (ref 0–100)
TCO2: 22 mmol/L (ref 0–100)
TCO2: 20 mmol/L (ref 0–100)
TCO2: 22 mmol/L (ref 0–100)

## 2014-04-26 LAB — POCT I-STAT 3, ART BLOOD GAS (G3+)
Acid-base deficit: 3 mmol/L — ABNORMAL HIGH (ref 0.0–2.0)
Acid-base deficit: 3 mmol/L — ABNORMAL HIGH (ref 0.0–2.0)
Acid-base deficit: 5 mmol/L — ABNORMAL HIGH (ref 0.0–2.0)
Acid-base deficit: 4 mmol/L — ABNORMAL HIGH (ref 0.0–2.0)
Bicarbonate: 24.8 mEq/L — ABNORMAL HIGH (ref 20.0–24.0)
Bicarbonate: 25.2 mEq/L — ABNORMAL HIGH (ref 20.0–24.0)
Bicarbonate: 20.5 mEq/L (ref 20.0–24.0)
Bicarbonate: 20.9 mEq/L (ref 20.0–24.0)
Bicarbonate: 21 mEq/L (ref 20.0–24.0)
O2 Saturation: 97 %
O2 Saturation: 100 %
O2 Saturation: 99 %
O2 Saturation: 93 %
O2 Saturation: 96 %
Patient temperature: 36.6
Patient temperature: 37.5
TCO2: 26 mmol/L (ref 0–100)
TCO2: 26 mmol/L (ref 0–100)
TCO2: 22 mmol/L (ref 0–100)
TCO2: 22 mmol/L (ref 0–100)
TCO2: 22 mmol/L (ref 0–100)
pCO2 arterial: 53.9 mmHg — ABNORMAL HIGH (ref 35.0–45.0)
pCO2 arterial: 42 mmHg (ref 35.0–45.0)
pCO2 arterial: 32 mmHg — ABNORMAL LOW (ref 35.0–45.0)
pCO2 arterial: 39.2 mmHg (ref 35.0–45.0)
pCO2 arterial: 40.1 mmHg (ref 35.0–45.0)
pH, Arterial: 7.271 — ABNORMAL LOW (ref 7.350–7.450)
pH, Arterial: 7.385 (ref 7.350–7.450)
pH, Arterial: 7.415 (ref 7.350–7.450)
pH, Arterial: 7.332 — ABNORMAL LOW (ref 7.350–7.450)
pH, Arterial: 7.33 — ABNORMAL LOW (ref 7.350–7.450)
pO2, Arterial: 101 mmHg — ABNORMAL HIGH (ref 80.0–100.0)
pO2, Arterial: 328 mmHg — ABNORMAL HIGH (ref 80.0–100.0)
pO2, Arterial: 154 mmHg — ABNORMAL HIGH (ref 80.0–100.0)
pO2, Arterial: 71 mmHg — ABNORMAL LOW (ref 80.0–100.0)
pO2, Arterial: 90 mmHg (ref 80.0–100.0)

## 2014-04-26 LAB — HEMOGLOBIN AND HEMATOCRIT, BLOOD
HCT: 27.6 % — ABNORMAL LOW (ref 39.0–52.0)
Hemoglobin: 10.1 g/dL — ABNORMAL LOW (ref 13.0–17.0)

## 2014-04-26 LAB — APTT: aPTT: 30 seconds (ref 24–37)

## 2014-04-26 LAB — PROTIME-INR
INR: 1.34 (ref 0.00–1.49)
Prothrombin Time: 16.6 seconds — ABNORMAL HIGH (ref 11.6–15.2)

## 2014-04-26 LAB — POCT I-STAT GLUCOSE
Glucose, Bld: 260 mg/dL — ABNORMAL HIGH (ref 70–99)
Operator id: 195151

## 2014-04-26 LAB — POCT I-STAT 4, (NA,K, GLUC, HGB,HCT)
Glucose, Bld: 189 mg/dL — ABNORMAL HIGH (ref 70–99)
HCT: 30 % — ABNORMAL LOW (ref 39.0–52.0)
Hemoglobin: 10.2 g/dL — ABNORMAL LOW (ref 13.0–17.0)
Potassium: 4.1 mEq/L (ref 3.7–5.3)
Sodium: 137 mEq/L (ref 137–147)

## 2014-04-26 LAB — CREATININE, SERUM
Creatinine, Ser: 0.85 mg/dL (ref 0.50–1.35)
GFR calc Af Amer: >90 mL/min (ref >90)
GFR calc non Af Amer: >90 mL/min (ref >90)

## 2014-04-26 LAB — PLATELET COUNT: Platelets: 252 10*3/uL (ref 150–400)

## 2014-04-26 LAB — MAGNESIUM: Magnesium: 2.5 mg/dL (ref 1.5–2.5)

## 2014-04-26 LAB — GLUCOSE, CAPILLARY: Glucose-Capillary: 324 mg/dL — ABNORMAL HIGH (ref 70–99)

## 2014-04-26 SURGERY — ECHOCARDIOGRAM, TRANSESOPHAGEAL, INTRAOPERATIVE
Anesthesia: General | Site: Chest

## 2014-04-26 MED ORDER — DEXTROSE 5 % IV SOLN
1.5000 g | Freq: Two times a day (BID) | INTRAVENOUS | Status: AC
  Administered 2014-04-26 – 2014-04-28 (×4): 1.5 g via INTRAVENOUS
  Filled 2014-04-26 (×5): qty 1.5

## 2014-04-26 MED ORDER — ALBUMIN HUMAN 5 % IV SOLN
INTRAVENOUS | Status: DC | PRN
  Administered 2014-04-26 (×2): via INTRAVENOUS

## 2014-04-26 MED ORDER — PHENYLEPHRINE HCL 10 MG/ML IJ SOLN
0.0000 ug/min | INTRAVENOUS | Status: DC
  Filled 2014-04-26: qty 2

## 2014-04-26 MED ORDER — PHENYLEPHRINE HCL 10 MG/ML IJ SOLN
0.0000 ug/min | INTRAMUSCULAR | Status: DC
  Administered 2014-04-26: 80 ug/min via INTRAVENOUS
  Administered 2014-04-27: 10 ug/min via INTRAVENOUS
  Administered 2014-04-27: 45 ug/min via INTRAVENOUS
  Administered 2014-04-28: 10 ug/min via INTRAVENOUS
  Filled 2014-04-26 (×4): qty 2

## 2014-04-26 MED ORDER — INSULIN REGULAR HUMAN 100 UNIT/ML IJ SOLN
250.0000 [IU] | INTRAMUSCULAR | Status: DC | PRN
  Administered 2014-04-26: 6.6 [IU]/h via INTRAVENOUS

## 2014-04-26 MED ORDER — MAGNESIUM SULFATE 4000MG/100ML IJ SOLN
4.0000 g | Freq: Once | INTRAMUSCULAR | Status: AC
  Administered 2014-04-26: 4 g via INTRAVENOUS
  Filled 2014-04-26: qty 100

## 2014-04-26 MED ORDER — FENTANYL CITRATE 0.05 MG/ML IJ SOLN
INTRAMUSCULAR | Status: AC
  Filled 2014-04-26: qty 5

## 2014-04-26 MED ORDER — SODIUM BICARBONATE 8.4 % IV SOLN
50.0000 meq | Freq: Once | INTRAVENOUS | Status: AC
Start: 2014-04-26 — End: 2014-04-26
  Administered 2014-04-26: 50 meq via INTRAVENOUS
  Filled 2014-04-26: qty 50

## 2014-04-26 MED ORDER — MILRINONE IN DEXTROSE 20 MG/100ML IV SOLN
0.3750 ug/kg/min | INTRAVENOUS | Status: AC
  Administered 2014-04-26: 50 ug/kg/min via INTRAVENOUS
  Filled 2014-04-26: qty 100

## 2014-04-26 MED ORDER — OXYCODONE HCL 5 MG PO TABS
5.0000 mg | ORAL_TABLET | ORAL | Status: DC | PRN
  Administered 2014-04-27 – 2014-05-03 (×17): 10 mg via ORAL
  Filled 2014-04-26 (×17): qty 2

## 2014-04-26 MED ORDER — ACETAMINOPHEN 160 MG/5ML PO SOLN
1000.0000 mg | Freq: Four times a day (QID) | ORAL | Status: AC
  Filled 2014-04-26: qty 40

## 2014-04-26 MED ORDER — LACTATED RINGERS IV SOLN
INTRAVENOUS | Status: DC | PRN
  Administered 2014-04-26: 08:00:00 via INTRAVENOUS

## 2014-04-26 MED ORDER — ACETAMINOPHEN 500 MG PO TABS
1000.0000 mg | ORAL_TABLET | Freq: Four times a day (QID) | ORAL | Status: AC
  Administered 2014-04-27 – 2014-05-01 (×15): 1000 mg via ORAL
  Filled 2014-04-26 (×20): qty 2

## 2014-04-26 MED ORDER — ARTIFICIAL TEARS OP OINT
TOPICAL_OINTMENT | OPHTHALMIC | Status: DC | PRN
  Administered 2014-04-26: 1 via OPHTHALMIC
  Administered 2014-04-26: 10 via OPHTHALMIC

## 2014-04-26 MED ORDER — BISACODYL 5 MG PO TBEC
10.0000 mg | DELAYED_RELEASE_TABLET | Freq: Every day | ORAL | Status: DC
  Administered 2014-04-27 – 2014-04-30 (×4): 10 mg via ORAL
  Filled 2014-04-26 (×5): qty 2

## 2014-04-26 MED ORDER — ACETAMINOPHEN 650 MG RE SUPP
650.0000 mg | Freq: Once | RECTAL | Status: AC
  Administered 2014-04-26: 650 mg via RECTAL

## 2014-04-26 MED ORDER — SODIUM CHLORIDE 0.9 % IJ SOLN
OROMUCOSAL | Status: DC | PRN
  Administered 2014-04-26 (×3): via TOPICAL

## 2014-04-26 MED ORDER — HEPARIN SODIUM (PORCINE) 1000 UNIT/ML IJ SOLN
INTRAMUSCULAR | Status: DC | PRN
  Administered 2014-04-26: 5000 [IU] via INTRAVENOUS
  Administered 2014-04-26: 30000 [IU] via INTRAVENOUS

## 2014-04-26 MED ORDER — DOPAMINE-DEXTROSE 3.2-5 MG/ML-% IV SOLN
0.0000 ug/kg/min | INTRAVENOUS | Status: DC
  Administered 2014-04-27: 3 ug/kg/min via INTRAVENOUS
  Filled 2014-04-26: qty 250

## 2014-04-26 MED ORDER — MIDAZOLAM HCL 2 MG/2ML IJ SOLN
2.0000 mg | INTRAMUSCULAR | Status: DC | PRN
  Administered 2014-04-26: 2 mg via INTRAVENOUS
  Filled 2014-04-26: qty 2

## 2014-04-26 MED ORDER — METOPROLOL TARTRATE 25 MG/10 ML ORAL SUSPENSION
12.5000 mg | Freq: Two times a day (BID) | ORAL | Status: DC
  Administered 2014-05-01: 12.5 mg
  Filled 2014-04-26 (×15): qty 5

## 2014-04-26 MED ORDER — ALBUMIN HUMAN 5 % IV SOLN
250.0000 mL | INTRAVENOUS | Status: AC | PRN
  Administered 2014-04-26 (×3): 250 mL via INTRAVENOUS
  Filled 2014-04-26: qty 250

## 2014-04-26 MED ORDER — 0.9 % SODIUM CHLORIDE (POUR BTL) OPTIME
TOPICAL | Status: DC | PRN
  Administered 2014-04-26: 6000 mL

## 2014-04-26 MED ORDER — ROCURONIUM BROMIDE 100 MG/10ML IV SOLN
INTRAVENOUS | Status: DC | PRN
  Administered 2014-04-26: 45 mg via INTRAVENOUS
  Administered 2014-04-26: 5 mg via INTRAVENOUS
  Administered 2014-04-26: 50 mg via INTRAVENOUS

## 2014-04-26 MED ORDER — CETYLPYRIDINIUM CHLORIDE 0.05 % MT LIQD
7.0000 mL | Freq: Four times a day (QID) | OROMUCOSAL | Status: DC
  Administered 2014-04-27 – 2014-05-02 (×20): 7 mL via OROMUCOSAL

## 2014-04-26 MED ORDER — VANCOMYCIN HCL IN DEXTROSE 1-5 GM/200ML-% IV SOLN
1000.0000 mg | Freq: Two times a day (BID) | INTRAVENOUS | Status: AC
  Administered 2014-04-26 – 2014-04-27 (×3): 1000 mg via INTRAVENOUS
  Filled 2014-04-26 (×5): qty 200

## 2014-04-26 MED ORDER — LACTATED RINGERS IV SOLN: 500.0000 mL | Freq: Once | INTRAVENOUS | Status: AC | PRN

## 2014-04-26 MED ORDER — MIDAZOLAM HCL 5 MG/5ML IJ SOLN
INTRAMUSCULAR | Status: DC | PRN
  Administered 2014-04-26: 1 mg via INTRAVENOUS
  Administered 2014-04-26: 3 mg via INTRAVENOUS
  Administered 2014-04-26: 2 mg via INTRAVENOUS
  Administered 2014-04-26: 1 mg via INTRAVENOUS
  Administered 2014-04-26: 2 mg via INTRAVENOUS
  Administered 2014-04-26: 1 mg via INTRAVENOUS

## 2014-04-26 MED ORDER — BISACODYL 10 MG RE SUPP: 10.0000 mg | Freq: Every day | RECTAL | Status: DC

## 2014-04-26 MED ORDER — DEXTROSE 5 % IV SOLN
2.0000 ug/min | INTRAVENOUS | Status: DC
  Filled 2014-04-26: qty 4

## 2014-04-26 MED ORDER — SODIUM CHLORIDE 0.9 % IV SOLN
INTRAVENOUS | Status: DC
  Administered 2014-04-26: 20 mL via INTRAVENOUS
  Administered 2014-04-27 – 2014-04-29 (×2): via INTRAVENOUS

## 2014-04-26 MED ORDER — NITROGLYCERIN IN D5W 200-5 MCG/ML-% IV SOLN
0.0000 ug/min | INTRAVENOUS | Status: DC
Start: 2014-04-26 — End: 2014-04-27

## 2014-04-26 MED ORDER — INSULIN REGULAR BOLUS VIA INFUSION
0.0000 [IU] | Freq: Three times a day (TID) | INTRAVENOUS | Status: DC
  Administered 2014-04-28: 1 [IU] via INTRAVENOUS
  Administered 2014-04-28: 2.9 [IU] via INTRAVENOUS
  Filled 2014-04-26: qty 10

## 2014-04-26 MED ORDER — VANCOMYCIN HCL IN DEXTROSE 1-5 GM/200ML-% IV SOLN
1000.0000 mg | Freq: Once | INTRAVENOUS | Status: DC
  Filled 2014-04-26: qty 200

## 2014-04-26 MED ORDER — PROPOFOL 10 MG/ML IV BOLUS
INTRAVENOUS | Status: AC
  Filled 2014-04-26: qty 20

## 2014-04-26 MED ORDER — SODIUM CHLORIDE 0.9 % IJ SOLN
INTRAMUSCULAR | Status: AC
  Filled 2014-04-26: qty 10

## 2014-04-26 MED ORDER — METOPROLOL TARTRATE 12.5 MG HALF TABLET
12.5000 mg | ORAL_TABLET | Freq: Two times a day (BID) | ORAL | Status: DC
  Administered 2014-04-30 – 2014-05-03 (×6): 12.5 mg via ORAL
  Filled 2014-04-26 (×15): qty 1

## 2014-04-26 MED ORDER — HEMOSTATIC AGENTS (NO CHARGE) OPTIME
TOPICAL | Status: DC | PRN
  Administered 2014-04-26: 1 via TOPICAL

## 2014-04-26 MED ORDER — ASPIRIN EC 325 MG PO TBEC
325.0000 mg | DELAYED_RELEASE_TABLET | Freq: Every day | ORAL | Status: DC
  Administered 2014-04-27 – 2014-05-03 (×7): 325 mg via ORAL
  Filled 2014-04-26 (×7): qty 1

## 2014-04-26 MED ORDER — SODIUM CHLORIDE 0.9 % IV SOLN: 250.0000 mL | INTRAVENOUS | Status: DC

## 2014-04-26 MED ORDER — MIDAZOLAM HCL 10 MG/2ML IJ SOLN
INTRAMUSCULAR | Status: AC
  Filled 2014-04-26: qty 2

## 2014-04-26 MED ORDER — ESMOLOL HCL 10 MG/ML IV SOLN
INTRAVENOUS | Status: AC
  Filled 2014-04-26: qty 10

## 2014-04-26 MED ORDER — BUDESONIDE-FORMOTEROL FUMARATE 160-4.5 MCG/ACT IN AERO
2.0000 | INHALATION_SPRAY | Freq: Two times a day (BID) | RESPIRATORY_TRACT | Status: DC
  Administered 2014-04-27 – 2014-05-03 (×13): 2 via RESPIRATORY_TRACT
  Filled 2014-04-26: qty 6

## 2014-04-26 MED ORDER — LEVALBUTEROL HCL 1.25 MG/0.5ML IN NEBU
1.2500 mg | INHALATION_SOLUTION | Freq: Four times a day (QID) | RESPIRATORY_TRACT | Status: DC
  Administered 2014-04-27: 1.25 mg via RESPIRATORY_TRACT
  Filled 2014-04-26 (×7): qty 0.5

## 2014-04-26 MED ORDER — PROTAMINE SULFATE 10 MG/ML IV SOLN
INTRAVENOUS | Status: AC
  Filled 2014-04-26: qty 50

## 2014-04-26 MED ORDER — SODIUM CHLORIDE 0.9 % IJ SOLN
INTRAMUSCULAR | Status: AC
Start: 2014-04-26 — End: 2014-04-26
  Filled 2014-04-26: qty 10

## 2014-04-26 MED ORDER — MUPIROCIN 2 % EX OINT
TOPICAL_OINTMENT | Freq: Two times a day (BID) | CUTANEOUS | Status: AC
  Administered 2014-04-26 – 2014-04-29 (×7): via NASAL
  Administered 2014-04-30: 1 via NASAL
  Administered 2014-04-30 – 2014-05-01 (×2): via NASAL

## 2014-04-26 MED ORDER — ONDANSETRON HCL 4 MG/2ML IJ SOLN
4.0000 mg | Freq: Four times a day (QID) | INTRAMUSCULAR | Status: DC | PRN
  Administered 2014-04-27: 4 mg via INTRAVENOUS
  Filled 2014-04-26: qty 2

## 2014-04-26 MED ORDER — FAMOTIDINE IN NACL 20-0.9 MG/50ML-% IV SOLN
20.0000 mg | Freq: Two times a day (BID) | INTRAVENOUS | Status: AC
  Administered 2014-04-26 (×2): 20 mg via INTRAVENOUS
  Filled 2014-04-26: qty 50

## 2014-04-26 MED ORDER — VECURONIUM BROMIDE 10 MG IV SOLR
INTRAVENOUS | Status: AC
  Filled 2014-04-26: qty 20

## 2014-04-26 MED ORDER — SODIUM BICARBONATE 8.4 % IV SOLN
INTRAVENOUS | Status: AC
  Administered 2014-04-26: 50 meq
  Filled 2014-04-26: qty 50

## 2014-04-26 MED ORDER — MORPHINE SULFATE 2 MG/ML IJ SOLN
1.0000 mg | INTRAMUSCULAR | Status: AC | PRN
  Administered 2014-04-26: 2 mg via INTRAVENOUS

## 2014-04-26 MED ORDER — CALCIUM CHLORIDE 10 % IV SOLN
INTRAVENOUS | Status: DC | PRN
  Administered 2014-04-26: 1 g via INTRAVENOUS

## 2014-04-26 MED ORDER — VECURONIUM BROMIDE 10 MG IV SOLR
INTRAVENOUS | Status: DC | PRN
  Administered 2014-04-26: 1 mg via INTRAVENOUS
  Administered 2014-04-26 (×2): 5 mg via INTRAVENOUS
  Administered 2014-04-26: 2 mg via INTRAVENOUS
  Administered 2014-04-26 (×2): 5 mg via INTRAVENOUS

## 2014-04-26 MED ORDER — ASPIRIN 81 MG PO CHEW: 324.0000 mg | CHEWABLE_TABLET | Freq: Every day | ORAL | Status: DC

## 2014-04-26 MED ORDER — CHLORHEXIDINE GLUCONATE 0.12 % MT SOLN
15.0000 mL | Freq: Two times a day (BID) | OROMUCOSAL | Status: DC
  Administered 2014-04-26 – 2014-04-29 (×7): 15 mL via OROMUCOSAL
  Filled 2014-04-26 (×8): qty 15

## 2014-04-26 MED ORDER — PANTOPRAZOLE SODIUM 40 MG PO TBEC
40.0000 mg | DELAYED_RELEASE_TABLET | Freq: Every day | ORAL | Status: DC
  Administered 2014-04-28 – 2014-05-03 (×6): 40 mg via ORAL
  Filled 2014-04-26 (×4): qty 1

## 2014-04-26 MED ORDER — ACETAMINOPHEN 160 MG/5ML PO SOLN: 650.0000 mg | Freq: Once | ORAL | Status: AC

## 2014-04-26 MED ORDER — SODIUM CHLORIDE 0.9 % IJ SOLN
3.0000 mL | Freq: Two times a day (BID) | INTRAMUSCULAR | Status: DC
  Administered 2014-04-27 – 2014-05-02 (×9): 3 mL via INTRAVENOUS

## 2014-04-26 MED ORDER — ROCURONIUM BROMIDE 50 MG/5ML IV SOLN
INTRAVENOUS | Status: AC
  Filled 2014-04-26: qty 2

## 2014-04-26 MED ORDER — POTASSIUM CHLORIDE 10 MEQ/50ML IV SOLN
10.0000 meq | INTRAVENOUS | Status: AC
  Administered 2014-04-26 (×2): 10 meq via INTRAVENOUS

## 2014-04-26 MED ORDER — PROPOFOL 10 MG/ML IV BOLUS
INTRAVENOUS | Status: DC | PRN
  Administered 2014-04-26: 30 mg via INTRAVENOUS

## 2014-04-26 MED ORDER — BICALUTAMIDE 50 MG PO TABS
50.0000 mg | ORAL_TABLET | Freq: Every day | ORAL | Status: DC
  Administered 2014-04-27 – 2014-05-03 (×7): 50 mg via ORAL
  Filled 2014-04-26 (×8): qty 1

## 2014-04-26 MED ORDER — LEVALBUTEROL HCL 1.25 MG/0.5ML IN NEBU
1.2500 mg | INHALATION_SOLUTION | Freq: Four times a day (QID) | RESPIRATORY_TRACT | Status: DC
Start: 2014-04-26 — End: 2014-04-26
  Administered 2014-04-26: 1.25 mg via RESPIRATORY_TRACT
  Filled 2014-04-26 (×3): qty 0.5

## 2014-04-26 MED ORDER — SODIUM CHLORIDE 0.9 % IV SOLN
INTRAVENOUS | Status: DC
  Administered 2014-04-26: 6.4 [IU]/h via INTRAVENOUS
  Administered 2014-04-27: 2.4 [IU]/h via INTRAVENOUS
  Filled 2014-04-26 (×2): qty 2.5

## 2014-04-26 MED ORDER — MILRINONE IN DEXTROSE 20 MG/100ML IV SOLN
0.3000 ug/kg/min | INTRAVENOUS | Status: DC
  Administered 2014-04-26: 0.2 ug/kg/min via INTRAVENOUS
  Filled 2014-04-26: qty 100

## 2014-04-26 MED ORDER — SODIUM CHLORIDE 0.9 % IV SOLN
INTRAVENOUS | Status: DC | PRN
  Administered 2014-04-26: 14:00:00 via INTRAVENOUS

## 2014-04-26 MED ORDER — SODIUM CHLORIDE 0.45 % IV SOLN
INTRAVENOUS | Status: DC
  Administered 2014-04-26: 20 mL/h via INTRAVENOUS

## 2014-04-26 MED ORDER — LACTATED RINGERS IV SOLN
INTRAVENOUS | Status: DC
  Administered 2014-04-26: 20 mL/h via INTRAVENOUS
  Administered 2014-04-29: 06:00:00 via INTRAVENOUS

## 2014-04-26 MED ORDER — SODIUM BICARBONATE 8.4 % IV SOLN: 50.0000 meq | Freq: Once | INTRAVENOUS | Status: AC

## 2014-04-26 MED ORDER — METOPROLOL TARTRATE 1 MG/ML IV SOLN: 2.5000 mg | INTRAVENOUS | Status: DC | PRN

## 2014-04-26 MED ORDER — MORPHINE SULFATE 2 MG/ML IJ SOLN
2.0000 mg | INTRAMUSCULAR | Status: DC | PRN
  Administered 2014-04-27 (×5): 2 mg via INTRAVENOUS
  Filled 2014-04-26: qty 2
  Filled 2014-04-26 (×5): qty 1

## 2014-04-26 MED ORDER — DEXMEDETOMIDINE HCL IN NACL 200 MCG/50ML IV SOLN
0.1000 ug/kg/h | INTRAVENOUS | Status: DC
  Filled 2014-04-26: qty 50

## 2014-04-26 MED ORDER — PROTAMINE SULFATE 10 MG/ML IV SOLN
INTRAVENOUS | Status: DC | PRN
  Administered 2014-04-26 (×2): 30 mg via INTRAVENOUS
  Administered 2014-04-26: 10 mg via INTRAVENOUS
  Administered 2014-04-26: 280 mg via INTRAVENOUS

## 2014-04-26 MED ORDER — SODIUM CHLORIDE 0.9 % IJ SOLN: 3.0000 mL | INTRAMUSCULAR | Status: DC | PRN

## 2014-04-26 MED ORDER — FENTANYL CITRATE 0.05 MG/ML IJ SOLN
INTRAMUSCULAR | Status: DC | PRN
  Administered 2014-04-26: 100 ug via INTRAVENOUS
  Administered 2014-04-26: 200 ug via INTRAVENOUS
  Administered 2014-04-26: 350 ug via INTRAVENOUS
  Administered 2014-04-26: 100 ug via INTRAVENOUS
  Administered 2014-04-26: 750 ug via INTRAVENOUS
  Administered 2014-04-26: 250 ug via INTRAVENOUS

## 2014-04-26 MED ORDER — DOCUSATE SODIUM 100 MG PO CAPS
200.0000 mg | ORAL_CAPSULE | Freq: Every day | ORAL | Status: DC
  Administered 2014-04-27 – 2014-05-03 (×5): 200 mg via ORAL
  Filled 2014-04-26 (×7): qty 2

## 2014-04-26 MED ORDER — HEPARIN SODIUM (PORCINE) 1000 UNIT/ML IJ SOLN
INTRAMUSCULAR | Status: AC
  Filled 2014-04-26: qty 1

## 2014-04-26 MED FILL — Mannitol IV Soln 20%: INTRAVENOUS | Qty: 500 | Status: AC

## 2014-04-26 MED FILL — Electrolyte-R (PH 7.4) Solution: INTRAVENOUS | Qty: 5000 | Status: AC

## 2014-04-26 MED FILL — Heparin Sodium (Porcine) Inj 1000 Unit/ML: INTRAMUSCULAR | Qty: 10 | Status: AC

## 2014-04-26 MED FILL — Lidocaine HCl IV Inj 20 MG/ML: INTRAVENOUS | Qty: 10 | Status: AC

## 2014-04-26 MED FILL — Sodium Bicarbonate IV Soln 8.4%: INTRAVENOUS | Qty: 50 | Status: AC

## 2014-04-26 MED FILL — Sodium Chloride IV Soln 0.9%: INTRAVENOUS | Qty: 2000 | Status: AC

## 2014-04-26 SURGICAL SUPPLY — 105 items
ADAPTER CARDIO PERF ANTE/RETRO (ADAPTER) ×4 IMPLANT
ADPR PRFSN 84XANTGRD RTRGD (ADAPTER) ×1
ATTRACTOMAT 16X20 MAGNETIC DRP (DRAPES) ×8 IMPLANT
BAG DECANTER FOR FLEXI CONT (MISCELLANEOUS) ×4 IMPLANT
BANDAGE ELASTIC 4 VELCRO ST LF (GAUZE/BANDAGES/DRESSINGS) ×4 IMPLANT
BANDAGE ELASTIC 6 VELCRO ST LF (GAUZE/BANDAGES/DRESSINGS) ×4 IMPLANT
BASKET HEART  (ORDER IN 25'S) (MISCELLANEOUS) ×1
BASKET HEART (ORDER IN 25'S) (MISCELLANEOUS) ×1
BASKET HEART (ORDER IN 25S) (MISCELLANEOUS) ×2 IMPLANT
BLADE STERNUM SYSTEM 6 (BLADE) ×4 IMPLANT
BLADE SURG 11 STRL SS (BLADE) ×4 IMPLANT
BLADE SURG 12 STRL SS (BLADE) ×4 IMPLANT
BLADE SURG ROTATE 9660 (MISCELLANEOUS) IMPLANT
BNDG GAUZE ELAST 4 BULKY (GAUZE/BANDAGES/DRESSINGS) ×4 IMPLANT
CANISTER SUCTION 2500CC (MISCELLANEOUS) ×4 IMPLANT
CANNULA ARTERIAL NVNT 3/8 22FR (MISCELLANEOUS) ×4 IMPLANT
CANNULA GUNDRY RCSP 15FR (MISCELLANEOUS) ×4 IMPLANT
CANNULA VENOUS LOW PROF 32X40 (CANNULA) IMPLANT
CARDIAC SUCTION (MISCELLANEOUS) ×4 IMPLANT
CATH CPB KIT VANTRIGT (MISCELLANEOUS) ×4 IMPLANT
CATH ROBINSON RED A/P 18FR (CATHETERS) ×20 IMPLANT
CATH THORACIC 36FR RT ANG (CATHETERS) ×4 IMPLANT
CLIP FOGARTY SPRING 6M (CLIP) ×8 IMPLANT
CLIP TI WIDE RED SMALL 24 (CLIP) ×8 IMPLANT
COVER SURGICAL LIGHT HANDLE (MISCELLANEOUS) ×4 IMPLANT
CRADLE DONUT ADULT HEAD (MISCELLANEOUS) ×4 IMPLANT
DERMABOND ADHESIVE PROPEN (GAUZE/BANDAGES/DRESSINGS) ×4
DERMABOND ADVANCED .7 DNX6 (GAUZE/BANDAGES/DRESSINGS) ×4 IMPLANT
DRAIN CHANNEL 32F RND 10.7 FF (WOUND CARE) ×4 IMPLANT
DRAPE CARDIOVASCULAR INCISE (DRAPES) ×3
DRAPE SLUSH/WARMER DISC (DRAPES) ×4 IMPLANT
DRAPE SRG 135X102X78XABS (DRAPES) ×2 IMPLANT
DRSG AQUACEL AG ADV 3.5X14 (GAUZE/BANDAGES/DRESSINGS) ×4 IMPLANT
ELECT BLADE 4.0 EZ CLEAN MEGAD (MISCELLANEOUS) ×4
ELECT BLADE 6.5 EXT (BLADE) ×4 IMPLANT
ELECT CAUTERY BLADE 6.4 (BLADE) ×4 IMPLANT
ELECT REM PT RETURN 9FT ADLT (ELECTROSURGICAL) ×8
ELECTRODE BLDE 4.0 EZ CLN MEGD (MISCELLANEOUS) ×2 IMPLANT
ELECTRODE REM PT RTRN 9FT ADLT (ELECTROSURGICAL) ×4 IMPLANT
GAUZE SPONGE 4X4 12PLY STRL (GAUZE/BANDAGES/DRESSINGS) ×8 IMPLANT
GLOVE BIO SURGEON STRL SZ 6 (GLOVE) ×8 IMPLANT
GLOVE BIO SURGEON STRL SZ 6.5 (GLOVE) ×15 IMPLANT
GLOVE BIO SURGEON STRL SZ7.5 (GLOVE) ×12 IMPLANT
GLOVE BIO SURGEONS STRL SZ 6.5 (GLOVE) ×5
GLOVE BIOGEL PI IND STRL 7.0 (GLOVE) ×8 IMPLANT
GLOVE BIOGEL PI INDICATOR 7.0 (GLOVE) ×8
GOWN STRL REUS W/ TWL LRG LVL3 (GOWN DISPOSABLE) ×20 IMPLANT
GOWN STRL REUS W/TWL LRG LVL3 (GOWN DISPOSABLE) ×30
HEMOSTAT POWDER SURGIFOAM 1G (HEMOSTASIS) ×12 IMPLANT
HEMOSTAT SURGICEL 2X14 (HEMOSTASIS) ×4 IMPLANT
INSERT FOGARTY XLG (MISCELLANEOUS) ×4 IMPLANT
KIT BASIN OR (CUSTOM PROCEDURE TRAY) ×4 IMPLANT
KIT ROOM TURNOVER OR (KITS) ×4 IMPLANT
KIT SUCTION CATH 14FR (SUCTIONS) ×4 IMPLANT
KIT VASOVIEW W/TROCAR VH 2000 (KITS) ×4 IMPLANT
LEAD PACING MYOCARDI (MISCELLANEOUS) ×4 IMPLANT
LINE EXTENSION DELIVERY (MISCELLANEOUS) ×4 IMPLANT
MARKER GRAFT CORONARY BYPASS (MISCELLANEOUS) ×12 IMPLANT
NS IRRIG 1000ML POUR BTL (IV SOLUTION) ×24 IMPLANT
PACK OPEN HEART (CUSTOM PROCEDURE TRAY) ×4 IMPLANT
PAD ARMBOARD 7.5X6 YLW CONV (MISCELLANEOUS) ×20 IMPLANT
PAD ELECT DEFIB RADIOL ZOLL (MISCELLANEOUS) ×4 IMPLANT
PENCIL BUTTON HOLSTER BLD 10FT (ELECTRODE) ×8 IMPLANT
PUNCH AORTIC ROTATE  4.5MM 8IN (MISCELLANEOUS) ×4 IMPLANT
PUNCH AORTIC ROTATE 4.0MM (MISCELLANEOUS) IMPLANT
PUNCH AORTIC ROTATE 4.5MM 8IN (MISCELLANEOUS) IMPLANT
PUNCH AORTIC ROTATE 5MM 8IN (MISCELLANEOUS) IMPLANT
SENSOR MYOCARDIAL TEMP (MISCELLANEOUS) ×4 IMPLANT
SET CARDIOPLEGIA MPS 5001102 (MISCELLANEOUS) ×4 IMPLANT
SURGIFLO W/THROMBIN 8M KIT (HEMOSTASIS) ×8 IMPLANT
SUT BONE WAX W31G (SUTURE) ×4 IMPLANT
SUT MNCRL AB 4-0 PS2 18 (SUTURE) ×8 IMPLANT
SUT PROLENE 3 0 SH DA (SUTURE) IMPLANT
SUT PROLENE 3 0 SH1 36 (SUTURE) IMPLANT
SUT PROLENE 4 0 RB 1 (SUTURE) ×6
SUT PROLENE 4 0 SH DA (SUTURE) ×4 IMPLANT
SUT PROLENE 4-0 RB1 .5 CRCL 36 (SUTURE) ×4 IMPLANT
SUT PROLENE 5 0 C 1 36 (SUTURE) ×8 IMPLANT
SUT PROLENE 6 0 C 1 30 (SUTURE) ×4 IMPLANT
SUT PROLENE 6 0 CC (SUTURE) ×16 IMPLANT
SUT PROLENE 8 0 BV175 6 (SUTURE) ×16 IMPLANT
SUT PROLENE BLUE 7 0 (SUTURE) ×16 IMPLANT
SUT SILK  1 MH (SUTURE)
SUT SILK 1 MH (SUTURE) IMPLANT
SUT SILK 2 0 SH CR/8 (SUTURE) ×4 IMPLANT
SUT SILK 3 0 SH CR/8 (SUTURE) IMPLANT
SUT STEEL 6MS V (SUTURE) ×12 IMPLANT
SUT STEEL STERNAL CCS#1 18IN (SUTURE) ×4 IMPLANT
SUT STEEL SZ 6 DBL 3X14 BALL (SUTURE) ×4 IMPLANT
SUT VIC AB 1 CTX 36 (SUTURE) ×4
SUT VIC AB 1 CTX36XBRD ANBCTR (SUTURE) ×4 IMPLANT
SUT VIC AB 2-0 CT1 27 (SUTURE) ×4
SUT VIC AB 2-0 CT1 TAPERPNT 27 (SUTURE) ×4 IMPLANT
SUT VIC AB 2-0 CTX 27 (SUTURE) IMPLANT
SUT VIC AB 3-0 X1 27 (SUTURE) IMPLANT
SUTURE E-PAK OPEN HEART (SUTURE) ×4 IMPLANT
SYSTEM SAHARA CHEST DRAIN ATS (WOUND CARE) ×4 IMPLANT
TAPE CLOTH SURG 4X10 WHT LF (GAUZE/BANDAGES/DRESSINGS) ×4 IMPLANT
TAPE PAPER 2X10 WHT MICROPORE (GAUZE/BANDAGES/DRESSINGS) ×4 IMPLANT
TOWEL OR 17X24 6PK STRL BLUE (TOWEL DISPOSABLE) ×8 IMPLANT
TOWEL OR 17X26 10 PK STRL BLUE (TOWEL DISPOSABLE) ×8 IMPLANT
TRAY FOLEY IC TEMP SENS 16FR (CATHETERS) ×4 IMPLANT
TUBING INSUFFLATION (TUBING) ×4 IMPLANT
UNDERPAD 30X30 INCONTINENT (UNDERPADS AND DIAPERS) ×4 IMPLANT
WATER STERILE IRR 1000ML POUR (IV SOLUTION) ×8 IMPLANT

## 2014-04-26 NOTE — Progress Notes (Signed)
  Echocardiogram Echocardiogram Transesophageal has been performed.  Bertine Schlottman FRANCES 04/26/2014, 9:47 AM

## 2014-04-26 NOTE — Transfer of Care (Signed)
Immediate Anesthesia Transfer of Care Note  Patient: TAKOTA CAHALAN Sr.  Procedure(s) Performed: Procedure(s) with comments: INTRAOPERATIVE TRANSESOPHAGEAL ECHOCARDIOGRAM (N/A) CORONARY ARTERY BYPASS GRAFTING (CABG), on pump, times four, using left internal mammary artery, right greater saphenous vein harvested endoscopically. (N/A) - LIMA to LAD, SVG to DIAGONAL, SVG to OM1, SVG to PDA) with EVH of the RIGHT THIGH and [place]  Patient Location: ICU and SICU  Anesthesia Type:General  Level of Consciousness: sedated and Patient remains intubated per anesthesia plan  Airway & Oxygen Therapy: Patient remains intubated per anesthesia plan and Patient placed on Ventilator (see vital sign flow sheet for setting)  Post-op Assessment: Report given to PACU RN and Post -op Vital signs reviewed and stable  Post vital signs: Reviewed and stable  Complications: No apparent anesthesia complications

## 2014-04-26 NOTE — OR Nursing (Signed)
Second call to SICU 

## 2014-04-26 NOTE — Anesthesia Preprocedure Evaluation (Addendum)
Anesthesia Evaluation  Patient identified by MRN, date of birth, ID band Patient awake    Reviewed: Allergy & Precautions, H&P , NPO status , Patient's Chart, lab work & pertinent test results  History of Anesthesia Complications (+) history of anesthetic complications  Airway Mallampati: III TM Distance: >3 FB Neck ROM: Full    Dental  (+) Chipped, Dental Advisory Given   Pulmonary shortness of breath, sleep apnea and Continuous Positive Airway Pressure Ventilation , former smoker (quit 7/15),  breath sounds clear to auscultation        Cardiovascular hypertension, Pt. on medications + CAD (severe 3v ASCADz) and + Peripheral Vascular Disease Rhythm:Regular Rate:Normal  04/22/14 ECHO: EF 55%, mild-mod MR   Neuro/Psych CVA (pontine CVA) negative neurological ROS     GI/Hepatic negative GI ROS, Neg liver ROS,   Endo/Other  diabetes (glu 324), Type 2, Insulin DependentMorbid obesity  Renal/GU negative Renal ROS     Musculoskeletal   Abdominal (+) + obese,   Peds  Hematology   Anesthesia Other Findings   Reproductive/Obstetrics                         Anesthesia Physical Anesthesia Plan  ASA: IV  Anesthesia Plan: General   Post-op Pain Management:    Induction: Intravenous  Airway Management Planned: Oral ETT  Additional Equipment: Arterial line, PA Cath, 3D TEE and Ultrasound Guidance Line Placement  Intra-op Plan:   Post-operative Plan: Post-operative intubation/ventilation  Informed Consent: I have reviewed the patients History and Physical, chart, labs and discussed the procedure including the risks, benefits and alternatives for the proposed anesthesia with the patient or authorized representative who has indicated his/her understanding and acceptance.   Dental advisory given  Plan Discussed with: CRNA and Surgeon  Anesthesia Plan Comments: (Plan routine monitors, A line, PA cath,  GETA with TEE and post op ventilation)        Anesthesia Quick Evaluation

## 2014-04-26 NOTE — Procedures (Signed)
Extubation Procedure Note  Patient Details:   Name: Phillip SALZMAN Sr. DOB: Jul 16, 1951 MRN: [id number]   Airway Documentation:     Evaluation  O2 sats: stable throughout 96% on 4 lt nasal cannula Complications: No apparent complications Patient did tolerate procedure well. Bilateral Breath Sounds: Rhonchi (coarse, upper) Suctioning: Airway Yes Insentive 1000  Chriss Driver [facility] 04/26/2014, 10:25 PM

## 2014-04-26 NOTE — OR Nursing (Signed)
First call to SICU 

## 2014-04-26 NOTE — Brief Op Note (Signed)
04/26/2014  1:02 PM  PATIENT:  Phillip Mimes Sr.  63 y.o. male  PRE-OPERATIVE DIAGNOSIS:  Coronary Artery Disease  POST-OPERATIVE DIAGNOSIS:  Coronary Artery Disease  PROCEDURE:  INTRAOPERATIVE TRANSESOPHAGEAL ECHOCARDIOGRAM, CORONARY ARTERY BYPASS GRAFTING (CABG)x  4 (LIMA to LAD, SVG to DIAGONAL, SVG to OM1, SVG to PDA) with EVH of the RIGHT THIGH and LOWER EXTREMITY SAPHENOUS VEIN  SURGEON:  Surgeon(s) and Role:    * Ivin Poot, MD - Primary  PHYSICIAN ASSISTANT: Lars Pinks PA-C  ANESTHESIA:   general  EBL:  Total I/O In: -  Out: 225 [Urine:225]   DRAINS: Chest tubes in the mediastinal and pleural spaces   COUNTS CORRECT:  YES  DICTATION: .Dragon Dictation  PLAN OF CARE: Admit to inpatient   PATIENT DISPOSITION:  ICU - intubated and hemodynamically stable.   Delay start of Pharmacological VTE agent (>24hrs) due to surgical blood loss or risk of bleeding: yes  BASELINE WEIGHT: 117 kg

## 2014-04-26 NOTE — Progress Notes (Signed)
The patient was examined and preop studies reviewed. There has been no change from the prior exam and the patient is ready for surgery.  Plan  CABG on Phillip Clements, today

## 2014-04-26 NOTE — Progress Notes (Signed)
Intubated, sedated  BP 105/48  Pulse 75  Temp(Src) 99 F (37.2 C) (Core (Comment))  Resp 19  Wt 257 lb 15 oz (117 kg)  SpO2 100%   Intake/Output Summary (Last 24 hours) at 04/26/14 1956 Last data filed at 04/26/14 1900  Gross per 24 hour  Intake 5343.28 ml  Output   2373 ml  Net 2970.28 ml    CI= 2.5  Minimal CT output  K= 4.1, Hct= 30  Wean vent as tolerated

## 2014-04-26 NOTE — Anesthesia Procedure Notes (Addendum)
Procedure Name: Intubation Performed by: Gwynneth Aliment Pre-anesthesia Checklist: Patient identified, Emergency Drugs available, Suction available and Patient being monitored Patient Re-evaluated:Patient Re-evaluated prior to inductionOxygen Delivery Method: Circle system utilized Preoxygenation: Pre-oxygenation with 100% oxygen Intubation Type: IV induction Ventilation: Mask ventilation with difficulty, Two handed mask ventilation required and Oral airway inserted - appropriate to patient size Laryngoscope Size: Mac and 4 Grade View: Grade III Tube type: Oral Tube size: 8.0 mm Number of attempts: 2 Airway Equipment and Method: Video-laryngoscopy Placement Confirmation: breath sounds checked- equal and bilateral and positive ETCO2 Secured at: 23 cm Tube secured with: Tape Difficulty Due To: Difficulty was anticipated

## 2014-04-27 ENCOUNTER — Inpatient Hospital Stay (HOSPITAL_COMMUNITY): Payer: BC Managed Care – PPO

## 2014-04-27 LAB — CBC
HCT: 26.2 % — ABNORMAL LOW (ref 39.0–52.0)
HCT: 25.2 % — ABNORMAL LOW (ref 39.0–52.0)
Hemoglobin: 9.3 g/dL — ABNORMAL LOW (ref 13.0–17.0)
Hemoglobin: 8.8 g/dL — ABNORMAL LOW (ref 13.0–17.0)
MCH: 28.8 pg (ref 26.0–34.0)
MCH: 29.3 pg (ref 26.0–34.0)
MCHC: 35.5 g/dL (ref 30.0–36.0)
MCHC: 34.9 g/dL (ref 30.0–36.0)
MCV: 81.1 fL (ref 78.0–100.0)
MCV: 84 fL (ref 78.0–100.0)
Platelets: 168 10*3/uL (ref 150–400)
Platelets: 126 10*3/uL — ABNORMAL LOW (ref 150–400)
RBC: 3.23 MIL/uL — ABNORMAL LOW (ref 4.22–5.81)
RBC: 3 MIL/uL — ABNORMAL LOW (ref 4.22–5.81)
RDW: 13 % (ref 11.5–15.5)
RDW: 13.4 % (ref 11.5–15.5)
WBC: 9.3 10*3/uL (ref 4.0–10.5)
WBC: 12.6 10*3/uL — ABNORMAL HIGH (ref 4.0–10.5)

## 2014-04-27 LAB — GLUCOSE, CAPILLARY
Glucose-Capillary: 116 mg/dL — ABNORMAL HIGH (ref 70–99)
Glucose-Capillary: 117 mg/dL — ABNORMAL HIGH (ref 70–99)
Glucose-Capillary: 119 mg/dL — ABNORMAL HIGH (ref 70–99)
Glucose-Capillary: 121 mg/dL — ABNORMAL HIGH (ref 70–99)
Glucose-Capillary: 122 mg/dL — ABNORMAL HIGH (ref 70–99)
Glucose-Capillary: 124 mg/dL — ABNORMAL HIGH (ref 70–99)
Glucose-Capillary: 125 mg/dL — ABNORMAL HIGH (ref 70–99)
Glucose-Capillary: 127 mg/dL — ABNORMAL HIGH (ref 70–99)
Glucose-Capillary: 128 mg/dL — ABNORMAL HIGH (ref 70–99)
Glucose-Capillary: 128 mg/dL — ABNORMAL HIGH (ref 70–99)
Glucose-Capillary: 138 mg/dL — ABNORMAL HIGH (ref 70–99)
Glucose-Capillary: 139 mg/dL — ABNORMAL HIGH (ref 70–99)
Glucose-Capillary: 147 mg/dL — ABNORMAL HIGH (ref 70–99)
Glucose-Capillary: 153 mg/dL — ABNORMAL HIGH (ref 70–99)
Glucose-Capillary: 157 mg/dL — ABNORMAL HIGH (ref 70–99)
Glucose-Capillary: 163 mg/dL — ABNORMAL HIGH (ref 70–99)
Glucose-Capillary: 180 mg/dL — ABNORMAL HIGH (ref 70–99)
Glucose-Capillary: 181 mg/dL — ABNORMAL HIGH (ref 70–99)
Glucose-Capillary: 185 mg/dL — ABNORMAL HIGH (ref 70–99)
Glucose-Capillary: 196 mg/dL — ABNORMAL HIGH (ref 70–99)
Glucose-Capillary: 206 mg/dL — ABNORMAL HIGH (ref 70–99)
Glucose-Capillary: 250 mg/dL — ABNORMAL HIGH (ref 70–99)
Glucose-Capillary: 259 mg/dL — ABNORMAL HIGH (ref 70–99)
Glucose-Capillary: 96 mg/dL (ref 70–99)

## 2014-04-27 LAB — POCT I-STAT 3, ART BLOOD GAS (G3+)
Acid-base deficit: 1 mmol/L (ref 0.0–2.0)
Acid-base deficit: 3 mmol/L — ABNORMAL HIGH (ref 0.0–2.0)
Bicarbonate: 22.6 mEq/L (ref 20.0–24.0)
Bicarbonate: 24.2 mEq/L — ABNORMAL HIGH (ref 20.0–24.0)
O2 Saturation: 93 %
O2 Saturation: 96 %
Patient temperature: 37.4
Patient temperature: 37.6
TCO2: 24 mmol/L (ref 0–100)
TCO2: 26 mmol/L (ref 0–100)
pCO2 arterial: 42.3 mmHg (ref 35.0–45.0)
pCO2 arterial: 43.7 mmHg (ref 35.0–45.0)
pH, Arterial: 7.338 — ABNORMAL LOW (ref 7.350–7.450)
pH, Arterial: 7.354 (ref 7.350–7.450)
pO2, Arterial: 70 mmHg — ABNORMAL LOW (ref 80.0–100.0)
pO2, Arterial: 86 mmHg (ref 80.0–100.0)

## 2014-04-27 LAB — BASIC METABOLIC PANEL
Anion gap: 10 (ref 5–15)
BUN: 19 mg/dL (ref 6–23)
CO2: 24 mEq/L (ref 19–32)
Calcium: 8 mg/dL — ABNORMAL LOW (ref 8.4–10.5)
Chloride: 104 mEq/L (ref 96–112)
Creatinine, Ser: 0.85 mg/dL (ref 0.50–1.35)
GFR calc Af Amer: >90 mL/min (ref >90)
GFR calc non Af Amer: >90 mL/min (ref >90)
Glucose, Bld: 129 mg/dL — ABNORMAL HIGH (ref 70–99)
Potassium: 4.2 mEq/L (ref 3.7–5.3)
Sodium: 138 mEq/L (ref 137–147)

## 2014-04-27 LAB — POCT I-STAT, CHEM 8
BUN: 20 mg/dL (ref 6–23)
Calcium, Ion: 1.17 mmol/L (ref 1.13–1.30)
Chloride: 103 mEq/L (ref 96–112)
Creatinine, Ser: 1.3 mg/dL (ref 0.50–1.35)
Glucose, Bld: 184 mg/dL — ABNORMAL HIGH (ref 70–99)
HCT: 22 % — ABNORMAL LOW (ref 39.0–52.0)
Hemoglobin: 7.5 g/dL — ABNORMAL LOW (ref 13.0–17.0)
Potassium: 4.3 mEq/L (ref 3.7–5.3)
Sodium: 130 mEq/L — ABNORMAL LOW (ref 137–147)
TCO2: 23 mmol/L (ref 0–100)

## 2014-04-27 LAB — CREATININE, SERUM
Creatinine, Ser: 1.11 mg/dL (ref 0.50–1.35)
GFR calc Af Amer: 80 mL/min — ABNORMAL LOW (ref >90)
GFR calc non Af Amer: 69 mL/min — ABNORMAL LOW (ref >90)

## 2014-04-27 LAB — MAGNESIUM
Magnesium: 2.4 mg/dL (ref 1.5–2.5)
Magnesium: 2.2 mg/dL (ref 1.5–2.5)

## 2014-04-27 MED ORDER — FUROSEMIDE 10 MG/ML IJ SOLN
40.0000 mg | Freq: Two times a day (BID) | INTRAMUSCULAR | Status: DC
  Administered 2014-04-27 – 2014-04-29 (×5): 40 mg via INTRAVENOUS
  Filled 2014-04-27 (×7): qty 4

## 2014-04-27 MED ORDER — INSULIN ASPART 100 UNIT/ML ~~LOC~~ SOLN
0.0000 [IU] | SUBCUTANEOUS | Status: DC
  Administered 2014-04-27 – 2014-04-28 (×2): 8 [IU] via SUBCUTANEOUS
  Administered 2014-04-28: 4 [IU] via SUBCUTANEOUS
  Administered 2014-04-28 – 2014-04-29 (×2): 8 [IU] via SUBCUTANEOUS
  Administered 2014-04-29: 4 [IU] via SUBCUTANEOUS
  Administered 2014-04-29: 2 [IU] via SUBCUTANEOUS
  Administered 2014-04-29: 8 [IU] via SUBCUTANEOUS
  Administered 2014-04-29: 12 [IU] via SUBCUTANEOUS
  Administered 2014-04-30: 4 [IU] via SUBCUTANEOUS

## 2014-04-27 MED ORDER — INSULIN DETEMIR 100 UNIT/ML ~~LOC~~ SOLN
18.0000 [IU] | Freq: Two times a day (BID) | SUBCUTANEOUS | Status: DC
  Administered 2014-04-27 (×2): 18 [IU] via SUBCUTANEOUS
  Filled 2014-04-27 (×4): qty 0.18

## 2014-04-27 MED ORDER — TRAMADOL HCL 50 MG PO TABS: 50.0000 mg | ORAL_TABLET | Freq: Four times a day (QID) | ORAL | Status: DC | PRN

## 2014-04-27 MED ORDER — METOCLOPRAMIDE HCL 5 MG/ML IJ SOLN
10.0000 mg | Freq: Four times a day (QID) | INTRAMUSCULAR | Status: AC
Start: 2014-04-27 — End: 2014-04-28
  Administered 2014-04-27 – 2014-04-28 (×8): 10 mg via INTRAVENOUS
  Filled 2014-04-27 (×9): qty 2

## 2014-04-27 MED ORDER — LEVALBUTEROL HCL 1.25 MG/0.5ML IN NEBU
1.2500 mg | INHALATION_SOLUTION | Freq: Four times a day (QID) | RESPIRATORY_TRACT | Status: DC
  Administered 2014-04-27 – 2014-04-28 (×8): 1.25 mg via RESPIRATORY_TRACT
  Filled 2014-04-27 (×13): qty 0.5

## 2014-04-27 MED FILL — Magnesium Sulfate Inj 50%: INTRAMUSCULAR | Qty: 10 | Status: AC

## 2014-04-27 MED FILL — Heparin Sodium (Porcine) Inj 1000 Unit/ML: INTRAMUSCULAR | Qty: 30 | Status: AC

## 2014-04-27 MED FILL — Potassium Chloride Inj 2 mEq/ML: INTRAVENOUS | Qty: 40 | Status: AC

## 2014-04-27 NOTE — Progress Notes (Signed)
Utilization Review Completed.  

## 2014-04-27 NOTE — Progress Notes (Signed)
Pt placed on cpap for the night with 5lt O2 bleed in. Pt comfortable at this time

## 2014-04-27 NOTE — Anesthesia Postprocedure Evaluation (Signed)
  Anesthesia Post-op Note  Patient: Phillip Mimes Sr.  Procedure(s) Performed: Procedure(s) with comments: INTRAOPERATIVE TRANSESOPHAGEAL ECHOCARDIOGRAM (N/A) CORONARY ARTERY BYPASS GRAFTING (CABG), on pump, times four, using left internal mammary artery, right greater saphenous vein harvested endoscopically. (N/A) - LIMA to LAD, SVG to DIAGONAL, SVG to OM1, SVG to PDA) with EVH of the RIGHT THIGH and [place]  Patient Location: ICU  Anesthesia Type:General  Level of Consciousness: sedated  Airway and Oxygen Therapy: Patient remains intubated per anesthesia plan  Post-op Pain: none  Post-op Assessment: Post-op Vital signs reviewed, Patient's Cardiovascular Status Stable and Respiratory Function Stable  Post-op Vital Signs: Reviewed and stable  Last Vitals:  Filed Vitals:   04/27/14 0700  BP: 120/53  Pulse: 86  Temp: 37.4 C  Resp: 20    Complications: No apparent anesthesia complications

## 2014-04-27 NOTE — Progress Notes (Signed)
Patient ID: Phillip Mimes Sr., male   DOB: [birth year], 63 y.o.   MRN: [id number]  Cardiothoracic Surgery:  Hemodynamically stable today. Neo off, on low dose dopamine.  Urine output ok  No problems.

## 2014-04-27 NOTE — Op Note (Signed)
NAME:  Phillip Clements, Phillip Clements NO.:  [id number]  MEDICAL RECORD NO.:  [id number]  LOCATION:  2S10C                        FACILITY:  [place]  PHYSICIAN:  Ivin Poot, M.D.  DATE OF BIRTH:  Dec 16, 1950  DATE OF PROCEDURE: DATE OF DISCHARGE:                              OPERATIVE REPORT   OPERATION: 1. Coronary artery bypass grafting x4 (left internal mammary artery to     left anterior descending artery, saphenous vein graft to obtuse     marginal 1, saphenous vein graft to diagonal, saphenous vein graft     to posterior descending). 2. Endoscopic harvest of right leg greater saphenous vein.  PREOPERATIVE DIAGNOSIS:  Severe three-vessel coronary artery disease with progressive class IV angina.  POSTOPERATIVE DIAGNOSIS:  Severe three-vessel coronary artery disease with progressive class IV angina.  SURGEON:  Ivin Poot, M.D.  ASSISTANT:  Lars Pinks, PA-C  ANESTHESIA:  General by Dr. Annye Asa.  CLINICAL NOTE:  The patient is a 63 year old, morbidly obese, diabetic, reformed smoker with symptoms of progressive angina.  Cardiac stress test was positive for ischemia and he underwent cardiac catheterization by Dr. Fletcher Anon.  This demonstrated severe multivessel coronary artery disease with mild-to-moderate LV dysfunction.  He was referred for surgical coronary revascularization.  His other medical problems included obesity, poorly-controlled diabetes, history of smoking, and sleep apnea.  I examined the patient in the office and reviewed the results of the cardiac catheterization with the patient and his spouse.  I discussed the indications and expected benefits of coronary artery bypass surgery for treatment of his severe coronary artery disease.  I discussed with the patient the major aspects of the operation including location of the surgical incisions, the use of general anesthesia and cardiopulmonary bypass, and the expected postoperative  hospital recovery.  I discussed with the patient the risks to him of coronary artery bypass surgery including risks of MI, stroke, bleeding, blood transfusion requirement, infection, pleural effusions, arrhythmias, and death.  After reviewing these issues, he demonstrated his understanding and agreed to proceed with surgery under what I felt was an informed consent.  OPERATIVE FINDINGS: 1. Poor quality targets consistent with a diabetic pattern of disease. 2. Adequate saphenous vein quality, but some dilatation of the vein     was found. 3. Good quality mammary artery with satisfactory flow. 4. No intraoperative blood products required.  DESCRIPTION OF PROCEDURE:  The patient was brought to the operating room and placed supine on the operating room table where general anesthesia was induced under invasive hemodynamic monitoring.  The chest, abdomen, and legs were prepped with Betadine and draped as a sterile field.  A transesophageal echo probe was placed by the anesthesiologist.  This demonstrated mild-to-moderate LV dysfunction with some mild mitral regurgitation.  A proper time-out was performed.  A sternal incision was made as the saphenous vein was harvested endoscopically from the right leg.  The left internal mammary artery was harvested with some difficulty from the left chest wall due to the patient's short stature and obese body habitus.  The mammary artery was a good flow with adequate flow.  The sternal retractor was placed using the deep blades.  Exposure to the  heart was difficult to the patient's morbid obesity and body habitus. The pericardium was suspended.  Pursestrings were placed in the ascending aorta and right atrium and heparin was administered.  When the ACT was documented as being therapeutic, the patient was cannulated and placed on cardiopulmonary bypass.  The coronary arteries were identified for grafting.  The distal circumflex vessels were too small to  graft. The right coronary was a poor target.  The diagonal was a small distal target.  The LAD was intramyocardial proximally and the anastomosis was placed for became epicardial and was a adequate target.  Cardioplegia cannulas were placed for both antegrade and retrograde cold blood cardioplegia, and the patient was cooled to 32 degrees.  The aortic crossclamp was applied.  One liter of cold blood cardioplegia was delivered in split doses between the antegrade aortic and retrograde coronary sinus catheters.  There was good cardioplegic arrest and septal temperature dropped less than 14 degrees.  Cardioplegia was delivered to every 20 minutes while the crossclamp was placed.  The distal coronary anastomoses were performed.  The first distal anastomosis was the posterior descending branch of the right coronary artery.  This was totally occluded.  It was a suboptimal target.  A reverse saphenous vein was sewn end-to-side with running 7-0 Prolene. There was good flow through the graft.  The second distal anastomosis was the OM branch of the left circumflex. This was a 1.5-mm vessel with proximal 80% stenosis.  A reverse saphenous vein was sewn end-to-side with running 7-0 Prolene with good flow through the graft.  Cardioplegia was redosed.  The third distal anastomosis was to the distal diagonal.  This was a 1.2- mm vessel with proximal occlusion.  The saphenous vein was sewn in a reverse fashion end-to-side with running 7-0 Prolene.  There was adequate flow through the graft.  Cardioplegia was redosed.  The fourth distal anastomosis was to the distal third of the LAD.  This was a 1.5-mm vessel and heavily calcified.  The left IMA pedicle was brought through an opening in the left lateral pericardium, was brought down onto the LAD and sewn end-to-side with running 8-0 Prolene.  There was good flow through the anastomosis after briefly releasing the bulldog pedicle on the mammary  artery.  The bulldog was reapplied and the pedicle was secured to the epicardium.  Cardioplegia was redosed.  While the peripheral crossclamp was still in place, two proximal vein anastomoses were performed on the ascending aorta.  The proximal anastomosis to the diagonal was sewn to the hood of the graft to the OM. After the proximal anastomoses were completed, a dose of retrograde warm blood cardioplegia was delivered to remove any residual air in the coronaries.  The crossclamp was then removed.  The heart was cardioverted back to a regular rhythm.  The vein grafts were de-aired and opened.  He has had adequate, good flow and hemostasis was documented proximally and distally.  The patient was rewarmed and reperfused.  Temporary pacing wires were applied.  The cardioplegia cannulas were removed.  The lungs were expanded.  The ventilator was resumed.  The patient was weaned off cardiopulmonary bypass without difficulty.  Hemodynamics were stable.  The echo showed somewhat improved global LV function with baseline stable mild mitral regurgitation.  Protamine was administered without adverse reaction. The cannula was removed.  The mediastinum was irrigated.  Hemostasis was obtained.  Anterior mediastinal and left pleural chest tube were placed and brought out through separate incisions.  The sternum  was closed with wire.  The pectoralis fascia was closed in a running #1 Vicryl.  The subcutaneous and skin layers were closed in running Vicryl, and sterile dressings were applied.  Total cardiopulmonary bypass time was 151 minutes.     Ivin Poot, M.D.     PV/MEDQ  D:  04/26/2014  T:  04/27/2014  Job:  [id number]  cc:   [name], MD

## 2014-04-27 NOTE — Progress Notes (Signed)
1 Day Post-Op Procedure(s) (LRB): INTRAOPERATIVE TRANSESOPHAGEAL ECHOCARDIOGRAM (N/A) CORONARY ARTERY BYPASS GRAFTING (CABG), on pump, times four, using left internal mammary artery, right greater saphenous vein harvested endoscopically. (N/A) Subjective: Obese diabetic with OSA stable after cabgx4, EF .40 Neo almost off Objective: Vital signs in last 24 hours: Temp:  [98.1 F (36.7 C)-99.7 F (37.6 C)] 99.3 F (37.4 C) (09/15 0700) Pulse Rate:  [62-136] 86 (09/15 0700) Cardiac Rhythm:  [-] Normal sinus rhythm (09/15 0400) Resp:  [9-27] 20 (09/15 0700) BP: (73-125)/(43-56) 120/53 mmHg (09/15 0700) SpO2:  [93 %-100 %] 96 % (09/15 0700) Arterial Line BP: (78-139)/(37-67) 139/52 mmHg (09/15 0700) FiO2 (%):  [40 %-50 %] 40 % (09/14 2100) Weight:  [278 lb (126.1 kg)] 278 lb (126.1 kg) (09/15 0500)  Hemodynamic parameters for last 24 hours: PAP: (24-133)/(11-84) 32/14 mmHg CO:  [5.5 L/min-7.7 L/min] 7.7 L/min CI:  [2.4 L/min/m2-3.4 L/min/m2] 3.4 L/min/m2  Intake/Output from previous day: 09/14 0701 - 09/15 0700 In: 6940.6 [I.V.:4327.6; Blood:563; IV UKGURKYHC:6237] Out: 6283 [TDVVO:1607; Blood:1000; Chest Tube:558] Intake/Output this shift:    Alert Neuro intact  Lab Results:  Recent Labs  04/26/14 2130 04/27/14 0415  WBC 13.5* 9.3  HGB 9.8* 9.3*  HCT 26.9* 26.2*  PLT 208 168   BMET:  Recent Labs  04/26/14 2122 04/26/14 2130 04/27/14 0415  NA 135*  --  138  K 4.5  --  4.2  CL 104  --  104  CO2  --   --  24  GLUCOSE 224*  --  129*  BUN 17  --  19  CREATININE 0.90 0.85 0.85  CALCIUM  --   --  8.0*    PT/INR:  Recent Labs  04/26/14 1530  LABPROT 16.6*  INR 1.34   ABG    Component Value Date/Time   PHART 7.354 04/27/2014 0419   HCO3 24.2* 04/27/2014 0419   TCO2 26 04/27/2014 0419   ACIDBASEDEF 1.0 04/27/2014 0419   O2SAT 93.0 04/27/2014 0419   CBG (last 3)   Recent Labs  04/26/14 0658 04/27/14 0152 04/27/14 0257  GLUCAP 324* 153* 147*     Assessment/Plan: S/P Procedure(s) (LRB): INTRAOPERATIVE TRANSESOPHAGEAL ECHOCARDIOGRAM (N/A) CORONARY ARTERY BYPASS GRAFTING (CABG), on pump, times four, using left internal mammary artery, right greater saphenous vein harvested endoscopically. (N/A) See progression orders Lasix BID  LOS: 1 day    VAN TRIGT III,PETER 04/27/2014

## 2014-04-28 ENCOUNTER — Inpatient Hospital Stay (HOSPITAL_COMMUNITY): Payer: BC Managed Care – PPO

## 2014-04-28 LAB — GLUCOSE, CAPILLARY
Glucose-Capillary: 104 mg/dL — ABNORMAL HIGH (ref 70–99)
Glucose-Capillary: 151 mg/dL — ABNORMAL HIGH (ref 70–99)
Glucose-Capillary: 169 mg/dL — ABNORMAL HIGH (ref 70–99)
Glucose-Capillary: 188 mg/dL — ABNORMAL HIGH (ref 70–99)
Glucose-Capillary: 199 mg/dL — ABNORMAL HIGH (ref 70–99)
Glucose-Capillary: 203 mg/dL — ABNORMAL HIGH (ref 70–99)
Glucose-Capillary: 208 mg/dL — ABNORMAL HIGH (ref 70–99)
Glucose-Capillary: 211 mg/dL — ABNORMAL HIGH (ref 70–99)
Glucose-Capillary: 212 mg/dL — ABNORMAL HIGH (ref 70–99)
Glucose-Capillary: 227 mg/dL — ABNORMAL HIGH (ref 70–99)
Glucose-Capillary: 235 mg/dL — ABNORMAL HIGH (ref 70–99)
Glucose-Capillary: 237 mg/dL — ABNORMAL HIGH (ref 70–99)
Glucose-Capillary: 237 mg/dL — ABNORMAL HIGH (ref 70–99)
Glucose-Capillary: 241 mg/dL — ABNORMAL HIGH (ref 70–99)
Glucose-Capillary: 249 mg/dL — ABNORMAL HIGH (ref 70–99)
Glucose-Capillary: 254 mg/dL — ABNORMAL HIGH (ref 70–99)
Glucose-Capillary: 286 mg/dL — ABNORMAL HIGH (ref 70–99)
Glucose-Capillary: 292 mg/dL — ABNORMAL HIGH (ref 70–99)
Glucose-Capillary: 298 mg/dL — ABNORMAL HIGH (ref 70–99)

## 2014-04-28 LAB — BASIC METABOLIC PANEL
Anion gap: 13 (ref 5–15)
BUN: 25 mg/dL — ABNORMAL HIGH (ref 6–23)
CO2: 23 mEq/L (ref 19–32)
Calcium: 7.9 mg/dL — ABNORMAL LOW (ref 8.4–10.5)
Chloride: 95 mEq/L — ABNORMAL LOW (ref 96–112)
Creatinine, Ser: 0.36 mg/dL — ABNORMAL LOW (ref 0.50–1.35)
GFR calc Af Amer: >90 mL/min (ref >90)
GFR calc non Af Amer: >90 mL/min (ref >90)
Glucose, Bld: 305 mg/dL — ABNORMAL HIGH (ref 70–99)
Potassium: 4.6 mEq/L (ref 3.7–5.3)
Sodium: 131 mEq/L — ABNORMAL LOW (ref 137–147)

## 2014-04-28 LAB — CBC
HCT: 24.1 % — ABNORMAL LOW (ref 39.0–52.0)
Hemoglobin: 8.6 g/dL — ABNORMAL LOW (ref 13.0–17.0)
MCH: 30 pg (ref 26.0–34.0)
MCHC: 35.7 g/dL (ref 30.0–36.0)
MCV: 84 fL (ref 78.0–100.0)
Platelets: 127 10*3/uL — ABNORMAL LOW (ref 150–400)
RBC: 2.87 MIL/uL — ABNORMAL LOW (ref 4.22–5.81)
RDW: 13.4 % (ref 11.5–15.5)
WBC: 14.4 10*3/uL — ABNORMAL HIGH (ref 4.0–10.5)

## 2014-04-28 MED ORDER — INSULIN DETEMIR 100 UNIT/ML ~~LOC~~ SOLN
50.0000 [IU] | Freq: Two times a day (BID) | SUBCUTANEOUS | Status: DC
  Administered 2014-04-28 – 2014-04-29 (×2): 50 [IU] via SUBCUTANEOUS
  Filled 2014-04-28 (×4): qty 0.5

## 2014-04-28 MED ORDER — INSULIN DETEMIR 100 UNIT/ML ~~LOC~~ SOLN
25.0000 [IU] | Freq: Two times a day (BID) | SUBCUTANEOUS | Status: DC
  Administered 2014-04-28: 25 [IU] via SUBCUTANEOUS
  Filled 2014-04-28 (×3): qty 0.25

## 2014-04-28 MED ORDER — POTASSIUM CHLORIDE CRYS ER 20 MEQ PO TBCR
40.0000 meq | EXTENDED_RELEASE_TABLET | Freq: Every day | ORAL | Status: DC
  Administered 2014-04-28 – 2014-05-02 (×5): 40 meq via ORAL
  Filled 2014-04-28 (×6): qty 2

## 2014-04-28 NOTE — Progress Notes (Signed)
Pt. seen after RN notified myself oxygen saturation trending downward, was placed on venturi mask @ 12 lpm=50%, from 6 lpm humidified n/c, CXR reviewed with  >'d bibasilar atelectasis- flutter ordered/started/instructed, pt. had good effort, I/S used X10 with 1000cc obtained, stressed importance of deep breaths/cough and use of heart pillow as splinting device and reminded pt./wife at bedaside of call bell to notify for pain control.

## 2014-04-28 NOTE — Progress Notes (Signed)
Inpatient Diabetes Program Recommendations  AACE/ADA: New Consensus Statement on Inpatient Glycemic Control (2013)  Target Ranges:  Prepandial:   less than 140 mg/dL      Peak postprandial:   less than 180 mg/dL (1-2 hours)      Critically ill patients:  140 - 180 mg/dL   Reason for Assessment:  Note that CBG's increased after transition off of insulin drip  Diabetes history: Type 2 diabetes Outpatient Diabetes medications: Lantus 150 units daily Current orders for Inpatient glycemic control: Insulin drip.  Levemir increased to 25 units bid.    May need more based on home dose.  If CBG's increase again after transition, may consider increasing to Levemir 50 units bid.    Thanks, Adah Perl, RN, BC-ADM Inpatient Diabetes Coordinator Pager [phone]

## 2014-04-28 NOTE — Progress Notes (Signed)
Patient ID: Phillip Mimes Sr., male   DOB: [birth year], 63 y.o.   MRN: [id number] EVENING ROUNDS NOTE :     [place]       [place] [address]             [phone]                 2 Days Post-Op Procedure(s) (LRB): INTRAOPERATIVE TRANSESOPHAGEAL ECHOCARDIOGRAM (N/A) CORONARY ARTERY BYPASS GRAFTING (CABG), on pump, times four, using left internal mammary artery, right greater saphenous vein harvested endoscopically. (N/A)  Total Length of Stay:  LOS: 2 days  BP 120/48  Pulse 95  Temp(Src) 99.8 F (37.7 C) (Oral)  Resp 20  Ht 5\' 9"  (1.753 m)  Wt 283 lb 4.7 oz (128.5 kg)  BMI 41.82 kg/m2  SpO2 94%  .Intake/Output     09/16 0701 - 09/17 0700   I.V. (mL/kg) 700.5 (5.5)   IV Piggyback 50   Total Intake(mL/kg) 750.5 (5.8)   Urine (mL/kg/hr) 605 (0.3)   Chest Tube    Total Output 605   Net +145.5         . sodium chloride 20 mL/hr at 04/26/14 1600  . sodium chloride 20 mL/hr at 04/28/14 0400  . sodium chloride    . DOPamine 3 mcg/kg/min (04/28/14 1800)  . insulin (NOVOLIN-R) infusion Stopped (04/28/14 1500)  . lactated ringers 20 mL/hr at 04/28/14 0400  . phenylephrine (NEO-SYNEPHRINE) Adult infusion Stopped (04/28/14 1100)     Lab Results  Component Value Date   WBC 14.4* 04/28/2014   HGB 8.6* 04/28/2014   HCT 24.1* 04/28/2014   PLT 127* 04/28/2014   GLUCOSE 305* 04/28/2014   CHOL 224* 03/23/2014   TRIG 194.0* 03/23/2014   HDL 32.60* 03/23/2014   LDLDIRECT 157.1 09/07/2013   LDLCALC 153* 03/23/2014   ALT 17 04/22/2014   AST 12 04/22/2014   NA 131* 04/28/2014   K 4.6 04/28/2014   CL 95* 04/28/2014   CREATININE 0.36* 04/28/2014   BUN 25* 04/28/2014   CO2 23 04/28/2014   TSH 1.47 02/04/2006   PSA 17.49 02/04/2006   INR 1.34 04/26/2014   HGBA1C 9.6* 04/22/2014   MICROALBUR 40.1* 09/14/2013   Glucose up to 235, levimir increased , patient says takes 150 lantus per day On dopamine , neo now off On vent max 94 % sat lasiz has been given  Grace Isaac MD  Beeper [phone] Office [phone] 04/28/2014 8:29 PM

## 2014-04-28 NOTE — Progress Notes (Addendum)
[address]       [place] [address]             [phone]      2 Days Post-Op Procedure(s) (LRB): INTRAOPERATIVE TRANSESOPHAGEAL ECHOCARDIOGRAM (N/A) CORONARY ARTERY BYPASS GRAFTING (CABG), on pump, times four, using left internal mammary artery, right greater saphenous vein harvested endoscopically. (N/A)  Subjective:  Phillip Clements states he feels "so/so" this morning.  He had to be restarted on Neo overnight.  Objective: Vital signs in last 24 hours: Temp:  [98 F (36.7 C)-99.4 F (37.4 C)] 98.1 F (36.7 C) (09/16 0753) Pulse Rate:  [58-132] 132 (09/16 0805) Cardiac Rhythm:  [-] Normal sinus rhythm (09/16 0400) Resp:  [14-32] 26 (09/16 0805) BP: (87-163)/(36-73) 163/53 mmHg (09/16 0805) SpO2:  [71 %-97 %] 96 % (09/16 0811) Arterial Line BP: (119-153)/(41-50) 134/41 mmHg (09/15 1900) Weight:  [283 lb 4.7 oz (128.5 kg)] 283 lb 4.7 oz (128.5 kg) (09/16 0530)  Hemodynamic parameters for last 24 hours: PAP: (31-37)/(15-16) 37/15 mmHg CO:  [7.1 L/min-8.4 L/min] 7.1 L/min CI:  [3.1 L/min/m2-3.6 L/min/m2] 3.1 L/min/m2  Intake/Output from previous day: 09/15 0701 - 09/16 0700 In: 2153.9 [I.V.:1653.9; IV Piggyback:500] Out: 1865 [Urine:1785; Chest Tube:80] Intake/Output this shift: Total I/O In: 130.4 [I.V.:80.4; IV Piggyback:50] Out: 45 [Urine:45]  General appearance: alert, cooperative and no distress Heart: regular rate and rhythm Lungs: clear to auscultation bilaterally Abdomen: soft, non-tender; bowel sounds normal; no masses,  no organomegaly Extremities: edema 2-3+ Wound: clean and dry  Lab Results:  Recent Labs  04/27/14 1700 04/27/14 1710 04/28/14 0405  WBC 12.6*  --  14.4*  HGB 8.8* 7.5* 8.6*  HCT 25.2* 22.0* 24.1*  PLT 126*  --  127*   BMET:  Recent Labs  04/27/14 0415  04/27/14 1710 04/28/14 0405  NA 138  --  130* 131*  K 4.2  --  4.3 4.6  CL 104  --  103 95*  CO2 24  --   --  23  GLUCOSE 129*  --  184* 305*  BUN  19  --  20 25*  CREATININE 0.85  < > 1.30 0.36*  CALCIUM 8.0*  --   --  7.9*  < > = values in this interval not displayed.  PT/INR:  Recent Labs  04/26/14 1530  LABPROT 16.6*  INR 1.34   ABG    Component Value Date/Time   PHART 7.354 04/27/2014 0419   HCO3 24.2* 04/27/2014 0419   TCO2 23 04/27/2014 1710   ACIDBASEDEF 1.0 04/27/2014 0419   O2SAT 93.0 04/27/2014 0419   CBG (last 3)   Recent Labs  04/27/14 2301 04/27/14 2345 04/28/14 0354  GLUCAP 286* 259* 208*    Assessment/Plan: S/P Procedure(s) (LRB): INTRAOPERATIVE TRANSESOPHAGEAL ECHOCARDIOGRAM (N/A) CORONARY ARTERY BYPASS GRAFTING (CABG), on pump, times four, using left internal mammary artery, right greater saphenous vein harvested endoscopically. (N/A)  1. CV- NSR, hypotensive overnight requiring restart of Neo-Synephrine, remains on Dopamine- will wean both drips as tolerated 2. Pulm- OSA, wean oxygen as tolerated, atelectasis on CXR, wean oxygen as tolerated, will order flutter valve 3. Renal- creatinine, lytes okay- remains hypervolemic about 30 lbs above admission weight, continue IV lasix today 4. Expected Acute Blood Loss Anemia- moderate, Hgb at 8.6 will monitor 5. DM- CBGs uncontrolled, sugars running high 200s overnight, was restarted on glucomander- will increase Levemir to 25U BID, continue SSIP coverage 6. Dispo- patient stable, will wean drips as tolerated, increased insulin coverage can  hopefully wean off glucomander today, leave foley in place for accurate I/O monitoring while on IV diuretic   LOS: 2 days    Phillip Clements, ERIN 04/28/2014 I have seen and examined Phillip Mimes Sr. and agree with the above assessment  and plan.  Grace Isaac MD Beeper [phone] Office [phone] 04/28/2014 8:28 PM

## 2014-04-29 ENCOUNTER — Encounter (HOSPITAL_COMMUNITY): Payer: Self-pay | Admitting: Cardiothoracic Surgery

## 2014-04-29 ENCOUNTER — Inpatient Hospital Stay (HOSPITAL_COMMUNITY): Payer: BC Managed Care – PPO

## 2014-04-29 LAB — BASIC METABOLIC PANEL
Anion gap: 13 (ref 5–15)
BUN: 32 mg/dL — ABNORMAL HIGH (ref 6–23)
CO2: 24 mEq/L (ref 19–32)
Calcium: 8.2 mg/dL — ABNORMAL LOW (ref 8.4–10.5)
Chloride: 97 mEq/L (ref 96–112)
Creatinine, Ser: 0.95 mg/dL (ref 0.50–1.35)
GFR calc Af Amer: >90 mL/min (ref >90)
GFR calc non Af Amer: 87 mL/min — ABNORMAL LOW (ref >90)
Glucose, Bld: 191 mg/dL — ABNORMAL HIGH (ref 70–99)
Potassium: 4.8 mEq/L (ref 3.7–5.3)
Sodium: 134 mEq/L — ABNORMAL LOW (ref 137–147)

## 2014-04-29 LAB — CBC
HCT: 24 % — ABNORMAL LOW (ref 39.0–52.0)
Hemoglobin: 8.2 g/dL — ABNORMAL LOW (ref 13.0–17.0)
MCH: 28.8 pg (ref 26.0–34.0)
MCHC: 34.2 g/dL (ref 30.0–36.0)
MCV: 84.2 fL (ref 78.0–100.0)
Platelets: 156 10*3/uL (ref 150–400)
RBC: 2.85 MIL/uL — ABNORMAL LOW (ref 4.22–5.81)
RDW: 13.4 % (ref 11.5–15.5)
WBC: 13.3 10*3/uL — ABNORMAL HIGH (ref 4.0–10.5)

## 2014-04-29 LAB — GLUCOSE, CAPILLARY
Glucose-Capillary: 203 mg/dL — ABNORMAL HIGH (ref 70–99)
Glucose-Capillary: 136 mg/dL — ABNORMAL HIGH (ref 70–99)
Glucose-Capillary: 188 mg/dL — ABNORMAL HIGH (ref 70–99)
Glucose-Capillary: 233 mg/dL — ABNORMAL HIGH (ref 70–99)
Glucose-Capillary: 273 mg/dL — ABNORMAL HIGH (ref 70–99)

## 2014-04-29 MED ORDER — FUROSEMIDE 10 MG/ML IJ SOLN
40.0000 mg | Freq: Every day | INTRAMUSCULAR | Status: DC
  Administered 2014-04-30: 40 mg via INTRAVENOUS
  Filled 2014-04-29: qty 4

## 2014-04-29 MED ORDER — LACTULOSE 10 GM/15ML PO SOLN
30.0000 g | Freq: Every day | ORAL | Status: AC
  Administered 2014-04-29 – 2014-04-30 (×2): 30 g via ORAL
  Filled 2014-04-29 (×2): qty 45

## 2014-04-29 MED ORDER — LEVALBUTEROL HCL 1.25 MG/0.5ML IN NEBU
1.2500 mg | INHALATION_SOLUTION | Freq: Four times a day (QID) | RESPIRATORY_TRACT | Status: DC
  Administered 2014-04-29: 1.25 mg via RESPIRATORY_TRACT
  Filled 2014-04-29 (×5): qty 0.5

## 2014-04-29 MED ORDER — INSULIN DETEMIR 100 UNIT/ML ~~LOC~~ SOLN
60.0000 [IU] | Freq: Two times a day (BID) | SUBCUTANEOUS | Status: DC
  Administered 2014-04-29 – 2014-05-03 (×8): 60 [IU] via SUBCUTANEOUS
  Filled 2014-04-29 (×9): qty 0.6

## 2014-04-29 MED ORDER — LEVALBUTEROL HCL 0.63 MG/3ML IN NEBU
0.6300 mg | INHALATION_SOLUTION | RESPIRATORY_TRACT | Status: DC | PRN
  Filled 2014-04-29: qty 3

## 2014-04-29 MED ORDER — LEVALBUTEROL HCL 1.25 MG/0.5ML IN NEBU
1.2500 mg | INHALATION_SOLUTION | Freq: Three times a day (TID) | RESPIRATORY_TRACT | Status: DC
  Administered 2014-04-29 – 2014-05-03 (×12): 1.25 mg via RESPIRATORY_TRACT
  Filled 2014-04-29 (×16): qty 0.5

## 2014-04-29 MED ORDER — ENOXAPARIN SODIUM 40 MG/0.4ML ~~LOC~~ SOLN
40.0000 mg | SUBCUTANEOUS | Status: DC
  Administered 2014-04-29 – 2014-05-02 (×4): 40 mg via SUBCUTANEOUS
  Filled 2014-04-29 (×5): qty 0.4

## 2014-04-29 MED ORDER — FE FUMARATE-B12-VIT C-FA-IFC PO CAPS
1.0000 | ORAL_CAPSULE | Freq: Two times a day (BID) | ORAL | Status: DC
  Administered 2014-04-29 – 2014-05-03 (×8): 1 via ORAL
  Filled 2014-04-29 (×11): qty 1

## 2014-04-29 MED ORDER — FUROSEMIDE 10 MG/ML IJ SOLN
40.0000 mg | Freq: Once | INTRAMUSCULAR | Status: AC
  Administered 2014-04-29: 40 mg via INTRAVENOUS

## 2014-04-29 NOTE — Progress Notes (Signed)
3 Days Post-Op Procedure(s) (LRB): INTRAOPERATIVE TRANSESOPHAGEAL ECHOCARDIOGRAM (N/A) CORONARY ARTERY BYPASS GRAFTING (CABG), on pump, times four, using left internal mammary artery, right greater saphenous vein harvested endoscopically. (N/A) Subjective: CABG x4- obese diabetic COPD- OSA, on venti mask ileus Starting to ambulate Objective: Vital signs in last 24 hours: Temp:  [97.5 F (36.4 C)-99.8 F (37.7 C)] 98.6 F (37 C) (09/17 0800) Pulse Rate:  [75-112] 86 (09/17 0800) Cardiac Rhythm:  [-] Normal sinus rhythm (09/17 0800) Resp:  [12-30] 25 (09/17 0800) BP: (64-134)/(32-112) 98/55 mmHg (09/17 0800) SpO2:  [87 %-100 %] 100 % (09/17 0800) FiO2 (%):  [45 %-50 %] 45 % (09/17 0400) Weight:  [278 lb 10.6 oz (126.4 kg)] 278 lb 10.6 oz (126.4 kg) (09/17 0530)  Hemodynamic parameters for last 24 hours:  nsr  Intake/Output from previous day: 09/16 0701 - 09/17 0700 In: 1478.7 [I.V.:1428.7; IV Piggyback:50] Out: 2055 [Urine:2055] Intake/Output this shift: Total I/O In: 44.4 [I.V.:44.4] Out: 490 [Urine:490]  Scattered rhonci abd distended  Lab Results:  Recent Labs  04/28/14 0405 04/29/14 0410  WBC 14.4* 13.3*  HGB 8.6* 8.2*  HCT 24.1* 24.0*  PLT 127* 156   BMET:  Recent Labs  04/28/14 0405 04/29/14 0410  NA 131* 134*  K 4.6 4.8  CL 95* 97  CO2 23 24  GLUCOSE 305* 191*  BUN 25* 32*  CREATININE 0.36* 0.95  CALCIUM 7.9* 8.2*    PT/INR:  Recent Labs  04/26/14 1530  LABPROT 16.6*  INR 1.34   ABG    Component Value Date/Time   PHART 7.354 04/27/2014 0419   HCO3 24.2* 04/27/2014 0419   TCO2 23 04/27/2014 1710   ACIDBASEDEF 1.0 04/27/2014 0419   O2SAT 93.0 04/27/2014 0419   CBG (last 3)   Recent Labs  04/28/14 2339 04/29/14 0355 04/29/14 0816  GLUCAP 237* 203* 136*    Assessment/Plan: S/P Procedure(s) (LRB): INTRAOPERATIVE TRANSESOPHAGEAL ECHOCARDIOGRAM (N/A) CORONARY ARTERY BYPASS GRAFTING (CABG), on pump, times four, using left internal  mammary artery, right greater saphenous vein harvested endoscopically. (N/A) Diabetes control Diuresis Wean O2  LOS: 3 days    Phillip Clements,Phillip Clements 04/29/2014

## 2014-04-29 NOTE — Progress Notes (Signed)
[facility] BRIEF SICU PROGRESS NOTE  3 Days Post-Op  S/P Procedure(s) (LRB): INTRAOPERATIVE TRANSESOPHAGEAL ECHOCARDIOGRAM (N/A) CORONARY ARTERY BYPASS GRAFTING (CABG), on pump, times four, using left internal mammary artery, right greater saphenous vein harvested endoscopically. (N/A)   Stable day NSR w/ stable BP O2 sats 95% on 2 L/min via [place] Diuresing some  Plan: Continue current plan  Sherleen Pangborn H 04/29/2014 7:13 PM

## 2014-04-30 ENCOUNTER — Inpatient Hospital Stay (HOSPITAL_COMMUNITY): Payer: BC Managed Care – PPO

## 2014-04-30 LAB — BASIC METABOLIC PANEL
Anion gap: 11 (ref 5–15)
BUN: 33 mg/dL — ABNORMAL HIGH (ref 6–23)
CO2: 27 mEq/L (ref 19–32)
Calcium: 8.5 mg/dL (ref 8.4–10.5)
Chloride: 97 mEq/L (ref 96–112)
Creatinine, Ser: 0.93 mg/dL (ref 0.50–1.35)
GFR calc Af Amer: >90 mL/min (ref >90)
GFR calc non Af Amer: 88 mL/min — ABNORMAL LOW (ref >90)
Glucose, Bld: 68 mg/dL — ABNORMAL LOW (ref 70–99)
Potassium: 4.3 mEq/L (ref 3.7–5.3)
Sodium: 135 mEq/L — ABNORMAL LOW (ref 137–147)

## 2014-04-30 LAB — CBC
HCT: 21.6 % — ABNORMAL LOW (ref 39.0–52.0)
Hemoglobin: 7.4 g/dL — ABNORMAL LOW (ref 13.0–17.0)
MCH: 29.2 pg (ref 26.0–34.0)
MCHC: 34.3 g/dL (ref 30.0–36.0)
MCV: 85.4 fL (ref 78.0–100.0)
Platelets: 163 10*3/uL (ref 150–400)
RBC: 2.53 MIL/uL — ABNORMAL LOW (ref 4.22–5.81)
RDW: 13.6 % (ref 11.5–15.5)
WBC: 8.3 10*3/uL (ref 4.0–10.5)

## 2014-04-30 LAB — GLUCOSE, CAPILLARY
Glucose-Capillary: 160 mg/dL — ABNORMAL HIGH (ref 70–99)
Glucose-Capillary: 191 mg/dL — ABNORMAL HIGH (ref 70–99)
Glucose-Capillary: 194 mg/dL — ABNORMAL HIGH (ref 70–99)
Glucose-Capillary: 201 mg/dL — ABNORMAL HIGH (ref 70–99)
Glucose-Capillary: 80 mg/dL (ref 70–99)
Glucose-Capillary: 82 mg/dL (ref 70–99)

## 2014-04-30 LAB — PREPARE RBC (CROSSMATCH)

## 2014-04-30 MED ORDER — FUROSEMIDE 10 MG/ML IJ SOLN: 40.0000 mg | Freq: Once | INTRAMUSCULAR | Status: DC

## 2014-04-30 MED ORDER — METOLAZONE 5 MG PO TABS
5.0000 mg | ORAL_TABLET | Freq: Every day | ORAL | Status: AC
  Administered 2014-04-30: 5 mg via ORAL
  Filled 2014-04-30: qty 1

## 2014-04-30 MED ORDER — SODIUM CHLORIDE 0.9 % IV SOLN: 250.0000 mL | INTRAVENOUS | Status: DC | PRN

## 2014-04-30 MED ORDER — AMIODARONE HCL IN DEXTROSE 360-4.14 MG/200ML-% IV SOLN
INTRAVENOUS | Status: AC
  Administered 2014-04-30: 200 mL
  Filled 2014-04-30: qty 200

## 2014-04-30 MED ORDER — INSULIN ASPART 100 UNIT/ML ~~LOC~~ SOLN
0.0000 [IU] | Freq: Three times a day (TID) | SUBCUTANEOUS | Status: DC
  Administered 2014-04-30: 8 [IU] via SUBCUTANEOUS
  Administered 2014-05-01 (×2): 2 [IU] via SUBCUTANEOUS
  Administered 2014-05-01 (×2): 4 [IU] via SUBCUTANEOUS
  Administered 2014-05-02: 2 [IU] via SUBCUTANEOUS
  Administered 2014-05-02: 12 [IU] via SUBCUTANEOUS
  Administered 2014-05-02 – 2014-05-03 (×3): 2 [IU] via SUBCUTANEOUS

## 2014-04-30 MED ORDER — INSULIN ASPART 100 UNIT/ML ~~LOC~~ SOLN
0.0000 [IU] | Freq: Three times a day (TID) | SUBCUTANEOUS | Status: DC
  Administered 2014-04-30: 4 [IU] via SUBCUTANEOUS

## 2014-04-30 MED ORDER — FUROSEMIDE 40 MG PO TABS: 40.0000 mg | ORAL_TABLET | Freq: Two times a day (BID) | ORAL | Status: DC

## 2014-04-30 MED ORDER — SODIUM CHLORIDE 0.9 % IJ SOLN
10.0000 mL | INTRAMUSCULAR | Status: DC | PRN
  Administered 2014-05-01 (×2): 10 mL
  Administered 2014-05-03: 20 mL

## 2014-04-30 MED ORDER — AMIODARONE LOAD VIA INFUSION
150.0000 mg | Freq: Once | INTRAVENOUS | Status: AC
  Administered 2014-04-30: 150 mg via INTRAVENOUS
  Filled 2014-04-30: qty 83.34

## 2014-04-30 MED ORDER — FUROSEMIDE 10 MG/ML IJ SOLN
40.0000 mg | Freq: Two times a day (BID) | INTRAMUSCULAR | Status: AC
  Administered 2014-04-30 – 2014-05-01 (×2): 40 mg via INTRAVENOUS
  Filled 2014-04-30 (×2): qty 4

## 2014-04-30 MED ORDER — SODIUM CHLORIDE 0.9 % IV SOLN
Freq: Once | INTRAVENOUS | Status: AC
  Administered 2014-04-30: 10:00:00 via INTRAVENOUS

## 2014-04-30 MED ORDER — MOVING RIGHT ALONG BOOK
Freq: Once | Status: DC
  Filled 2014-04-30: qty 1

## 2014-04-30 MED ORDER — SODIUM CHLORIDE 0.9 % IJ SOLN: 3.0000 mL | INTRAMUSCULAR | Status: DC | PRN

## 2014-04-30 MED ORDER — MAGNESIUM HYDROXIDE 400 MG/5ML PO SUSP: 30.0000 mL | Freq: Every day | ORAL | Status: DC | PRN

## 2014-04-30 MED ORDER — AMIODARONE HCL IN DEXTROSE 360-4.14 MG/200ML-% IV SOLN
30.0000 mg/h | INTRAVENOUS | Status: DC
Start: 2014-04-30 — End: 2014-05-01
  Administered 2014-04-30 (×2): 30 mg/h via INTRAVENOUS
  Filled 2014-04-30 (×5): qty 200

## 2014-04-30 MED ORDER — SODIUM CHLORIDE 0.9 % IJ SOLN
3.0000 mL | Freq: Two times a day (BID) | INTRAMUSCULAR | Status: DC
Start: 2014-04-30 — End: 2014-05-03
  Administered 2014-05-01 – 2014-05-02 (×2): 3 mL via INTRAVENOUS

## 2014-04-30 MED ORDER — SODIUM CHLORIDE 0.9 % IJ SOLN
10.0000 mL | Freq: Two times a day (BID) | INTRAMUSCULAR | Status: DC
  Administered 2014-05-01: 10 mL

## 2014-04-30 MED ORDER — AMIODARONE HCL IN DEXTROSE 360-4.14 MG/200ML-% IV SOLN
60.0000 mg/h | INTRAVENOUS | Status: AC
  Administered 2014-04-30: 60 mg/h via INTRAVENOUS
  Filled 2014-04-30: qty 200

## 2014-04-30 NOTE — Progress Notes (Signed)
Peripherally Inserted Central Catheter/Midline Placement  The IV Nurse has discussed with the patient and/or persons authorized to consent for the patient, the purpose of this procedure and the potential benefits and risks involved with this procedure.  The benefits include less needle sticks, lab draws from the catheter and patient may be discharged home with the catheter.  Risks include, but not limited to, infection, bleeding, blood clot (thrombus formation), and puncture of an artery; nerve damage and irregular heat beat.  Alternatives to this procedure were also discussed.  PICC/Midline Placement Documentation        Phillip Clements 04/30/2014, 10:15 AM

## 2014-04-30 NOTE — Progress Notes (Signed)
Inpatient Diabetes Program Recommendations  AACE/ADA: New Consensus Statement on Inpatient Glycemic Control (2013)  Target Ranges:  Prepandial:   less than 140 mg/dL      Peak postprandial:   less than 180 mg/dL (1-2 hours)      Critically ill patients:  140 - 180 mg/dL   Reason for Visit: Results for NIMROD, WENDT (MRN [id number]) as of 04/30/2014 12:44  Ref. Range 04/30/2014 11:30  Glucose-Capillary Latest Range: 70-99 mg/dL 191 (H)   Diabetes history: Type 2 diabetes Outpatient Diabetes medications: Lantus 150 units daily, Januvia 50 mg daily Current orders for Inpatient glycemic control:  Levemir 60 units bid, Novolog resistant tid with meals.   MD please consider adding Novolog meal coverage 6 units tid with meals (Hold if patient eats less than 50%). Thanks, Adah Perl, RN, BC-ADM Inpatient Diabetes Coordinator Pager [phone]

## 2014-04-30 NOTE — Progress Notes (Addendum)
      [address]       [place],Phillip Clements             [phone]      4 Days Post-Op Procedure(s) (LRB): INTRAOPERATIVE TRANSESOPHAGEAL ECHOCARDIOGRAM (N/A) CORONARY ARTERY BYPASS GRAFTING (CABG), on pump, times four, using left internal mammary artery, right greater saphenous vein harvested endoscopically. (N/A)  Subjective:  Phillip Clements states he is cold this morning.  He states he feels okay otherwise.  Patient needs to ambulate  Objective: Vital signs in last 24 hours: Temp:  [97.8 F (36.6 C)-98.6 F (37 C)] 97.8 F (36.6 C) (09/18 0401) Pulse Rate:  [75-99] 75 (09/18 0700) Cardiac Rhythm:  [-] Normal sinus rhythm (09/18 0400) Resp:  [11-25] 18 (09/18 0700) BP: (93-147)/(46-97) 127/53 mmHg (09/18 0700) SpO2:  [93 %-100 %] 96 % (09/18 0700) FiO2 (%):  [40 %] 40 % (09/17 0859) Weight:  [275 lb 5.7 oz (124.9 kg)] 275 lb 5.7 oz (124.9 kg) (09/18 0600)  Intake/Output from previous day: 09/17 0701 - 09/18 0700 In: 568.8 [I.V.:568.8] Out: 2740 [Urine:2740]  General appearance: alert, cooperative and icteric Heart: regular rate and rhythm Lungs: diminished breath sounds bibasilar Abdomen: soft, non-tender; bowel sounds normal; no masses,  no organomegaly Extremities: edema 2-3+ Wound: clean and dry  Lab Results:  Recent Labs  04/29/14 0410 04/30/14 0520  WBC 13.3* 8.3  HGB 8.2* 7.4*  HCT 24.0* 21.6*  PLT 156 163   BMET:  Recent Labs  04/29/14 0410 04/30/14 0520  NA 134* 135*  K 4.8 4.3  CL 97 97  CO2 24 27  GLUCOSE 191* 68*  BUN 32* 33*  CREATININE 0.95 0.93  CALCIUM 8.2* 8.5    PT/INR: No results found for this basename: LABPROT, INR,  in the last 72 hours ABG    Component Value Date/Time   PHART 7.354 04/27/2014 0419   HCO3 24.2* 04/27/2014 0419   TCO2 23 04/27/2014 1710   ACIDBASEDEF 1.0 04/27/2014 0419   O2SAT 93.0 04/27/2014 0419   CBG (last 3)   Recent Labs  04/29/14 1925 04/29/14 2354 04/30/14 0343  GLUCAP 273*  194* 80    Assessment/Plan: S/P Procedure(s) (LRB): INTRAOPERATIVE TRANSESOPHAGEAL ECHOCARDIOGRAM (N/A) CORONARY ARTERY BYPASS GRAFTING (CABG), on pump, times four, using left internal mammary artery, right greater saphenous vein harvested endoscopically. (N/A)  1. CV- Hemodynamically stable, off all drips- continue Lopressor 2. Pulm- continued basilar atelectasis, wean oxygen as tolerated, good use of flutter valve 3. Renal- creatinine, lytes okay remains hypervolemic will give IV lasix today 4. Expected Acute Blood Loss Anemia- moderate Hgb down to 7.4, will transfuse 1U of PRBCs 5. GU- foley in place, urine appears turbid, no leukocytosis or fever present will monitor, may benefit from UA 6. DM- sugars remain on the high side, episode of hypoglycemia this morning currently on Levemir 60U BID, will add SSIP coverage 7. Dispo- patient off drips, would benefit from transfusion of packed cells, needs to ambulate, continue current care  LOS: 4 days    BARRETT, ERIN 04/30/2014  1 unit PRBC's for symptomatic postop anemia tx to stepdown DC foley patient examined and medical record reviewed,agree with above note. VAN TRIGT III,[place] Nine 04/30/2014

## 2014-04-30 NOTE — Progress Notes (Signed)
Pt stated he would be ready to go on CPAP at 11pm, RT will come back at that time and place pt on machine.

## 2014-05-01 ENCOUNTER — Inpatient Hospital Stay (HOSPITAL_COMMUNITY): Payer: BC Managed Care – PPO

## 2014-05-01 LAB — TYPE AND SCREEN
ABO/RH(D): A POS
Antibody Screen: NEGATIVE
Unit division: 0

## 2014-05-01 LAB — GLUCOSE, CAPILLARY
Glucose-Capillary: 153 mg/dL — ABNORMAL HIGH (ref 70–99)
Glucose-Capillary: 169 mg/dL — ABNORMAL HIGH (ref 70–99)

## 2014-05-01 LAB — CBC
HCT: 26.2 % — ABNORMAL LOW (ref 39.0–52.0)
HCT: 24.7 % — ABNORMAL LOW (ref 39.0–52.0)
Hemoglobin: 8.7 g/dL — ABNORMAL LOW (ref 13.0–17.0)
Hemoglobin: 8.2 g/dL — ABNORMAL LOW (ref 13.0–17.0)
MCH: 28.6 pg (ref 26.0–34.0)
MCH: 28.8 pg (ref 26.0–34.0)
MCHC: 33.2 g/dL (ref 30.0–36.0)
MCHC: 33.2 g/dL (ref 30.0–36.0)
MCV: 86.2 fL (ref 78.0–100.0)
MCV: 86.7 fL (ref 78.0–100.0)
Platelets: 249 10*3/uL (ref 150–400)
Platelets: 220 10*3/uL (ref 150–400)
RBC: 3.04 MIL/uL — ABNORMAL LOW (ref 4.22–5.81)
RBC: 2.85 MIL/uL — ABNORMAL LOW (ref 4.22–5.81)
RDW: 13.7 % (ref 11.5–15.5)
RDW: 13.7 % (ref 11.5–15.5)
WBC: 10.4 10*3/uL (ref 4.0–10.5)
WBC: 7.7 10*3/uL (ref 4.0–10.5)

## 2014-05-01 LAB — BASIC METABOLIC PANEL
Anion gap: 11 (ref 5–15)
BUN: 35 mg/dL — ABNORMAL HIGH (ref 6–23)
CO2: 29 mEq/L (ref 19–32)
Calcium: 8.7 mg/dL (ref 8.4–10.5)
Chloride: 92 mEq/L — ABNORMAL LOW (ref 96–112)
Creatinine, Ser: 0.99 mg/dL (ref 0.50–1.35)
GFR calc Af Amer: >90 mL/min (ref >90)
GFR calc non Af Amer: 86 mL/min — ABNORMAL LOW (ref >90)
Glucose, Bld: 164 mg/dL — ABNORMAL HIGH (ref 70–99)
Potassium: 4.6 mEq/L (ref 3.7–5.3)
Sodium: 132 mEq/L — ABNORMAL LOW (ref 137–147)

## 2014-05-01 MED ORDER — AMIODARONE HCL 200 MG PO TABS
400.0000 mg | ORAL_TABLET | Freq: Two times a day (BID) | ORAL | Status: DC
  Administered 2014-05-01 – 2014-05-02 (×4): 400 mg via ORAL
  Filled 2014-05-01 (×6): qty 2

## 2014-05-01 MED ORDER — INFLUENZA VAC SPLIT QUAD 0.5 ML IM SUSY
0.5000 mL | PREFILLED_SYRINGE | INTRAMUSCULAR | Status: AC
  Administered 2014-05-02: 0.5 mL via INTRAMUSCULAR
  Filled 2014-05-01: qty 0.5

## 2014-05-01 MED ORDER — LISINOPRIL 2.5 MG PO TABS
2.5000 mg | ORAL_TABLET | Freq: Every day | ORAL | Status: DC
  Administered 2014-05-01 – 2014-05-03 (×3): 2.5 mg via ORAL
  Filled 2014-05-01 (×3): qty 1

## 2014-05-01 MED ORDER — FUROSEMIDE 10 MG/ML IJ SOLN
40.0000 mg | Freq: Two times a day (BID) | INTRAMUSCULAR | Status: AC
  Administered 2014-05-01: 40 mg via INTRAVENOUS
  Filled 2014-05-01: qty 4

## 2014-05-01 MED ORDER — INSULIN ASPART 100 UNIT/ML ~~LOC~~ SOLN
6.0000 [IU] | Freq: Three times a day (TID) | SUBCUTANEOUS | Status: DC
  Administered 2014-05-01 – 2014-05-03 (×5): 6 [IU] via SUBCUTANEOUS

## 2014-05-01 NOTE — Progress Notes (Addendum)
       [address]       ,[place] [address]             [phone]          5 Days Post-Op Procedure(s) (LRB): INTRAOPERATIVE TRANSESOPHAGEAL ECHOCARDIOGRAM (N/A) CORONARY ARTERY BYPASS GRAFTING (CABG), on pump, times four, using left internal mammary artery, right greater saphenous vein harvested endoscopically. (N/A)  Subjective: Just back from x-ray.  C/o some nausea, no appetite.  +BM .  Breathing stable.   Objective: Vital signs in last 24 hours: Patient Vitals for the past 24 hrs:  BP Temp Temp src Pulse Resp SpO2 Weight  05/01/14 0738 - - - - - 97 % -  05/01/14 0439 137/67 mmHg 98.2 F (36.8 C) Oral 70 20 99 % 270 lb 14.4 oz (122.879 kg)  04/30/14 2215 - - - - - 97 % -  04/30/14 2047 127/54 mmHg 98.8 F (37.1 C) Oral 80 18 97 % -  04/30/14 1500 120/50 mmHg - - 74 17 96 % -  04/30/14 1442 116/57 mmHg - - 73 18 97 % -  04/30/14 1400 130/49 mmHg - - 79 15 95 % -  04/30/14 1300 156/67 mmHg - - 91 22 95 % -  04/30/14 1200 120/53 mmHg - - 85 19 94 % -  04/30/14 1138 - 99.2 F (37.3 C) Oral - - - -  04/30/14 1100 139/59 mmHg 98.2 F (36.8 C) Oral 87 23 94 % -  04/30/14 1035 147/58 mmHg - - 147 22 91 % -  04/30/14 1000 131/62 mmHg 97.4 F (36.3 C) Oral 88 18 92 % -  04/30/14 0915 - - Oral 85 19 93 % -   Current Weight  05/01/14 270 lb 14.4 oz (122.879 kg)   BASELINE WEIGHT: 117 kg   Intake/Output from previous day: 09/18 0701 - 09/19 0700 In: 473.3 [I.V.:123.3; Blood:350] Out: 0350 [Urine:3710]  CBGs 201-153-164   PHYSICAL EXAM:  Heart: RRR Lungs: Clear Wound: Clean and dry Extremities: + R>LLE edema and ecchymosis    Lab Results: CBC: Recent Labs  05/01/14 0050 05/01/14 0610  WBC 10.4 7.7  HGB 8.7* 8.2*  HCT 26.2* 24.7*  PLT 249 220   BMET:  Recent Labs  04/30/14 0520 05/01/14 0610  NA 135* 132*  K 4.3 4.6  CL 97 92*  CO2 27 29  GLUCOSE 68* 164*  BUN 33* 35*  CREATININE 0.93 0.99  CALCIUM 8.5 8.7    PT/INR:  No results found for this basename: LABPROT, INR,  in the last 72 hours    Assessment/Plan: S/P Procedure(s) (LRB): INTRAOPERATIVE TRANSESOPHAGEAL ECHOCARDIOGRAM (N/A) CORONARY ARTERY BYPASS GRAFTING (CABG), on pump, times four, using left internal mammary artery, right greater saphenous vein harvested endoscopically. (N/A)  CV- AF, now maintaining SR.  Will switch Amio from IV to po once current bag is empty. Continue Lopressor, add home Lisinopril for increasing BP.  Vol overload- continue Lasix/Zaroxyln.  DM- Sugars remain elevated.  Per diabetes coordinator recs, will add meal coverage, continue Levemir. A1C=9.6  Expected postop blood loss anemia- H/H generally stable after transfusion. Will watch. Not symptomatic.  CRPI, pulm toilet. Follow up am CXR.   LOS: 5 days    COLLINS,GINA H 05/01/2014  Patient seen and examined, agree with above CXr shows small bilateral effusions and some mild atelectasis

## 2014-05-01 NOTE — Progress Notes (Signed)
CARDIAC REHAB PHASE I   PRE:  Rate/Rhythm: 40 SR  BP:  Supine:   Sitting: 98/44  Standing: 93/48   SaO2: 93 2l  MODE:  Ambulation: 150 ft  POST:  Rate/Rhythm: 89 SR  BP:  Supine:   Sitting: 103/54  Standing:   SaO2: 97 2 L Blood pressure low before walking.  Manual 70/40 two different readings.  Automatic cuff 98/44.  No complaints of dizziness.  Standing bp 93/48.  Reported to Rhoderick Moody, patient's nurse.  Tolerated ambulation well without complaints.  Spo2 97% on 2 L upon return to room, decreased oxygen to 1l, reported to nurse. 1420-1500 Liliane Channel RN, BSN 05/01/2014 2:28 PM

## 2014-05-02 LAB — GLUCOSE, CAPILLARY
Glucose-Capillary: 127 mg/dL — ABNORMAL HIGH (ref 70–99)
Glucose-Capillary: 98 mg/dL (ref 70–99)
Glucose-Capillary: 158 mg/dL — ABNORMAL HIGH (ref 70–99)
Glucose-Capillary: 158 mg/dL — ABNORMAL HIGH (ref 70–99)
Glucose-Capillary: 258 mg/dL — ABNORMAL HIGH (ref 70–99)
Glucose-Capillary: 261 mg/dL — ABNORMAL HIGH (ref 70–99)

## 2014-05-02 MED ORDER — FUROSEMIDE 40 MG PO TABS
40.0000 mg | ORAL_TABLET | Freq: Every day | ORAL | Status: DC
  Administered 2014-05-02 – 2014-05-03 (×2): 40 mg via ORAL
  Filled 2014-05-02 (×2): qty 1

## 2014-05-02 NOTE — Progress Notes (Signed)
Pt. Has already placed himself on CPAP via his FFM from home. CPAP is set at 8cm H2O. Pt. Is tolerating CPAP well at this time.

## 2014-05-02 NOTE — Discharge Summary (Signed)
[address]       [place] [address]             [phone]              Discharge Summary  Name: Phillip YON Sr. DOB: [birth year] 63 y.o. MRN: [id number]   Admission Date: 04/26/2014 Discharge Date: 05/03/2014    Admitting Diagnosis: Severe three vessel coronary artery disease   Discharge Diagnosis:  Severe three vessel coronary artery disease Expected postoperative blood loss anemia  Past Medical History  Diagnosis Date  . Diabetes mellitus - A1C=9.6 2000    Type II  . Hypertension 2000  . Environmental allergies   . Pneumonia     x3 as an adult-  last time 2009  . Arthritis   . Hyperlipidemia   . History of chicken pox   . Syncope and collapse 04/11/14  . Stroke     H/o pontine CVA, prev eval by Dr. Erling Cruz  . Shortness of breath     sometimes, with exertion  . Coronary artery disease   . Prostate cancer     metastatic to neck     Procedures: CORONARY ARTERY BYPASS GRAFTING x 4 (Left internal mammary artery to left anterior descending, saphenous vein graft to obtuse marginal 1, saphenous vein graft to diagonal, saphenous vein graft to posterior descending)  ENDOSCOPIC VEIN HARVEST RIGHT LEG - 04/26/2014   HPI:  The patient is a 63 y.o. male who recently presented for evaluation of chest pain. He has no previous cardiac history. He has known history of diabetes for at least 25 years which has not been optimally controlled. He also has other medical conditions that include hyperlipidemia, tobacco use, obesity and prostate cancer. He describes progressive symptoms of substernal chest tightness radiating to his left arm which started about 6 months ago. This has been associated with exertional dyspnea. He cut down on his activities due to that. He denies discomfort at rest. He reports a brief syncopal episode last night while he was sitting in chair. He did have associated sweating but there was no chest pain, palpitations or shortness  of breath.  He quit smoking one month ago. He has extensive family history of coronary artery disease .  He was seen by Dr. Fletcher Anon and underwent cardiac catheterization which revealed total occlusion of the RCA, 90% stenosis of the proximal LAD/diagonal, 80% stenosis of the OM with EF of 45%. LVEDP is 12. The patient was referred to Dr. Prescott Gum for cardiac surgery evaluation and it was felt that he would benefit from CABG at this time. All risks, benefits and alternatives of surgery were explained in detail, and the patient agreed to proceed.     Hospital Course:  The patient was admitted to [facility] on 04/26/2014. The patient was taken to the operating room and underwent the above procedure.    The postoperative course was notable for initial hypotension requiring pressors. His hemodynamics stabilized and these were weaned and discontinued.  He also had symptomatic blood loss anemia which required transfusion of one unit packed red blood cells.  He also developed atrial fibrillation, which converted to sinus rhythm on Amiodarone.    The patient presently is progressing well.  His blood sugars have been somewhat labile, and we have been titrating his insulin accordingly.  He remains in sinus rhythm on Amiodarone and low dose Lopressor, and was restarted on his home dose of Lisinopril for blood pressure control.  He has been volume overloaded, and has been aggressively diuresed with Lasix and Zaroxyln.  He is tolerating a regular diet and is ambulating in the halls without difficulty.  Incisions are healing well.  He has been seen and evaluated by Dr. Prescott Gum and myself and is felt surgically stable for discharge today.   Recent vital signs:  Filed Vitals:   05/03/14 0907  BP: 114/44  Pulse: 69  Temp: 97.8 F (36.6 C)  Resp: 20    Recent laboratory studies:  CBC:  Recent Labs  05/01/14 0610 05/03/14 0455  WBC 7.7 10.0  HGB 8.2* 8.9*  HCT 24.7* 26.9*  PLT 220 315   BMET:    Recent Labs  05/01/14 0610 05/03/14 0455  NA 132* 134*  K 4.6 4.7  CL 92* 94*  CO2 29 29  GLUCOSE 164* 150*  BUN 35* 35*  CREATININE 0.99 1.09  CALCIUM 8.7 8.8    PT/INR: No results found for this basename: LABPROT, INR,  in the last 72 hours   Discharge Medications:     Medication List    STOP taking these medications       nitroGLYCERIN 0.4 MG SL tablet  Commonly known as:  NITROSTAT      TAKE these medications       amiodarone 200 MG tablet  Commonly known as:  PACERONE  Take 1 tablet (200 mg total) by mouth 2 (two) times daily. For one week then take Amiodarone 200 mg by mouth daily thereafter.     aspirin 325 MG EC tablet  Take 1 tablet (325 mg total) by mouth daily.     bicalutamide 50 MG tablet  Commonly known as:  CASODEX  Take 50 mg by mouth daily.     budesonide-formoterol 160-4.5 MCG/ACT inhaler  Commonly known as:  SYMBICORT  Inhale 2 puffs into the lungs 2 (two) times daily.     ferrous sulfate 325 (65 FE) MG tablet  Take 1 tablet (325 mg total) by mouth daily with breakfast. For one month then stop.     furosemide 40 MG tablet  Commonly known as:  LASIX  Take 1 tablet (40 mg total) by mouth daily. For 10 days then stop.     glucose blood test strip  by Other route. Use daily as ordered. Code: 250.00     insulin glargine 100 UNIT/ML injection  Commonly known as:  LANTUS  Inject 1.5 mLs (150 Units total) into the skin daily. Disp 15 pens     Insulin Pen Needle 31G X 8 MM Misc  Use as directed to inject blood sugar twice daily dx:250.00     leuprolide (6 Month) 45 MG injection  Commonly known as:  leuprolide acetate (6 Month)  Inject 45 mg into the skin every 6 (six) months.     lisinopril 2.5 MG tablet  Commonly known as:  ZESTRIL  Take 1 tablet (2.5 mg total) by mouth daily.     metoprolol tartrate 25 MG tablet  Commonly known as:  LOPRESSOR  Take 0.5 tablets (12.5 mg total) by mouth 2 (two) times daily.     oxyCODONE 5 MG  immediate release tablet  Commonly known as:  Oxy IR/ROXICODONE  Take 1-2 tablets (5-10 mg total) by mouth every 4 (four) hours as needed for severe pain.     potassium chloride SA 20 MEQ tablet  Commonly known as:  K-DUR,KLOR-CON  Take 1 tablet (20 mEq total) by mouth daily. For 10 days then  stop.     sitaGLIPtin 50 MG tablet  Commonly known as:  JANUVIA  Take 1 tablet (50 mg total) by mouth daily.       The patient has been discharged on:   1.Beta Blocker:  Yes [  x ]                              No   [   ]                              If No, reason:  2.Ace Inhibitor/ARB: Yes [ x  ]                                     No  [    ]                                     If No, reason:  3.Statin:   Yes [   ]                  No  [ x  ]                  If No, reason:Allegic  4.[place]:  Yes  [ x  ]                  No   [   ]                  If No, reason:  Discharge Instructions:  The patient is to refrain from driving, heavy lifting or strenuous activity.  May shower daily and clean incisions with soap and water.  May resume regular diet.   Follow Up:    Follow-up Information   Follow up with [name], MD. (Call for a follow up appointment for 2 weeks)    Specialty:  Cardiology   Contact information:   [address] [address] [phone]       Follow up with VAN Wilber Oliphant, MD. (Office will contact you to schedule appointment)    Specialty:  Cardiothoracic Surgery   Contact information:   [place] [address] [phone]       Follow up with Elsie Stain, MD. (Call regarding further surveillance of HGA1C 9.6 and further diabetes management)    Specialty:  Family Medicine   Contact information:   [place] [address] [phone]        Arnoldo Lenis 05/03/2014, 10:51 AM

## 2014-05-02 NOTE — Progress Notes (Signed)
Pt says that he will put himself on CPAP. Pt understands to call RT if he needs help. RT will monitor.

## 2014-05-02 NOTE — Progress Notes (Addendum)
       [address]       [place] [address]             [phone]          6 Days Post-Op Procedure(s) (LRB): INTRAOPERATIVE TRANSESOPHAGEAL ECHOCARDIOGRAM (N/A) CORONARY ARTERY BYPASS GRAFTING (CABG), on pump, times four, using left internal mammary artery, right greater saphenous vein harvested endoscopically. (N/A)  Subjective: Feels a lot better today.  Easier to take a deep breath.  Still has intermittent waves of nausea, but overall improved.   Objective: Vital signs in last 24 hours: Patient Vitals for the past 24 hrs:  BP Temp Temp src Pulse Resp SpO2 Weight  05/02/14 0425 121/52 mmHg 97.9 F (36.6 C) Oral 65 18 100 % 267 lb 1.6 oz (121.156 kg)  05/02/14 0013 - - - 70 16 98 % -  05/01/14 2124 - - - - - 98 % -  05/01/14 2001 118/50 mmHg 99 F (37.2 C) Oral 76 18 98 % -  05/01/14 1344 93/42 mmHg - - 82 - - -  05/01/14 1340 - - - - - 96 % -   Current Weight  05/02/14 267 lb 1.6 oz (121.156 kg)  BASELINE WEIGHT: 117 kg    Intake/Output from previous day: 09/19 0701 - 09/20 0700 In: 240 [P.O.:240] Out: 1875 [Urine:1875]  CBGs 169- 127-98   PHYSICAL EXAM:  Heart: RRR Lungs: Decreased BS in bases bilaterally Wound: Clean and dry Extremities: +LE edema    Lab Results: CBC: Recent Labs  05/01/14 0050 05/01/14 0610  WBC 10.4 7.7  HGB 8.7* 8.2*  HCT 26.2* 24.7*  PLT 249 220   BMET:  Recent Labs  04/30/14 0520 05/01/14 0610  NA 135* 132*  K 4.3 4.6  CL 97 92*  CO2 27 29  GLUCOSE 68* 164*  BUN 33* 35*  CREATININE 0.93 0.99  CALCIUM 8.5 8.7    PT/INR: No results found for this basename: LABPROT, INR,  in the last 72 hours    Assessment/Plan: S/P Procedure(s) (LRB): INTRAOPERATIVE TRANSESOPHAGEAL ECHOCARDIOGRAM (N/A) CORONARY ARTERY BYPASS GRAFTING (CABG), on pump, times four, using left internal mammary artery, right greater saphenous vein harvested endoscopically. (N/A)  CV- AF, now maintaining SR. Continue po  Amio, low dose Lopressor and Lisinopril. He had some low BPs yesterday, but they appear to be stable today.  Vol overload- UOP adequate yesterday after IV Lasix/Zaroxyln. Will switch to po Lasix and continue diuresis.  DM- Sugars improved.Continue Levemir, Novolog, SSI. A1C=9.6   Expected postop blood loss anemia- H/H generally stable after transfusion. Will watch. Will repeat labs in am .  CRPI, pulm toilet.   GI- continues to have episodes of nausea.  Bowels working.  May be related to Amiodarone, as the symptoms did improve when he was switched to po Amio.  May need to decrease dose if symptoms persist and he remains in SR.  Possibly ready for d/c home in next few days if he continues to progress.   LOS: 6 days    COLLINS,GINA H 05/02/2014  Patient seen and examined, agree with above. Probably home tomorrow

## 2014-05-03 LAB — CBC
HCT: 26.9 % — ABNORMAL LOW (ref 39.0–52.0)
Hemoglobin: 8.9 g/dL — ABNORMAL LOW (ref 13.0–17.0)
MCH: 29.2 pg (ref 26.0–34.0)
MCHC: 33.1 g/dL (ref 30.0–36.0)
MCV: 88.2 fL (ref 78.0–100.0)
Platelets: 315 10*3/uL (ref 150–400)
RBC: 3.05 MIL/uL — ABNORMAL LOW (ref 4.22–5.81)
RDW: 14.2 % (ref 11.5–15.5)
WBC: 10 10*3/uL (ref 4.0–10.5)

## 2014-05-03 LAB — BASIC METABOLIC PANEL
Anion gap: 11 (ref 5–15)
BUN: 35 mg/dL — ABNORMAL HIGH (ref 6–23)
CO2: 29 mEq/L (ref 19–32)
Calcium: 8.8 mg/dL (ref 8.4–10.5)
Chloride: 94 mEq/L — ABNORMAL LOW (ref 96–112)
Creatinine, Ser: 1.09 mg/dL (ref 0.50–1.35)
GFR calc Af Amer: 82 mL/min — ABNORMAL LOW (ref >90)
GFR calc non Af Amer: 71 mL/min — ABNORMAL LOW (ref >90)
Glucose, Bld: 150 mg/dL — ABNORMAL HIGH (ref 70–99)
Potassium: 4.7 mEq/L (ref 3.7–5.3)
Sodium: 134 mEq/L — ABNORMAL LOW (ref 137–147)

## 2014-05-03 LAB — GLUCOSE, CAPILLARY
Glucose-Capillary: 198 mg/dL — ABNORMAL HIGH (ref 70–99)
Glucose-Capillary: 141 mg/dL — ABNORMAL HIGH (ref 70–99)
Glucose-Capillary: 103 mg/dL — ABNORMAL HIGH (ref 70–99)

## 2014-05-03 MED ORDER — FUROSEMIDE 40 MG PO TABS: 40.0000 mg | ORAL_TABLET | Freq: Every day | ORAL | Status: DC

## 2014-05-03 MED ORDER — AMIODARONE HCL 200 MG PO TABS: 200.0000 mg | ORAL_TABLET | Freq: Two times a day (BID) | ORAL | Status: DC

## 2014-05-03 MED ORDER — ASPIRIN 325 MG PO TBEC: 325.0000 mg | DELAYED_RELEASE_TABLET | Freq: Every day | ORAL | Status: DC

## 2014-05-03 MED ORDER — AMIODARONE HCL 200 MG PO TABS
200.0000 mg | ORAL_TABLET | Freq: Two times a day (BID) | ORAL | Status: DC
  Administered 2014-05-03: 200 mg via ORAL
  Filled 2014-05-03 (×2): qty 1

## 2014-05-03 MED ORDER — POTASSIUM CHLORIDE CRYS ER 20 MEQ PO TBCR: 20.0000 meq | EXTENDED_RELEASE_TABLET | Freq: Every day | ORAL | Status: DC

## 2014-05-03 MED ORDER — OXYCODONE HCL 5 MG PO TABS: 5.0000 mg | ORAL_TABLET | ORAL | Status: DC | PRN

## 2014-05-03 MED ORDER — LINAGLIPTIN 5 MG PO TABS
5.0000 mg | ORAL_TABLET | Freq: Every day | ORAL | Status: DC
  Administered 2014-05-03: 5 mg via ORAL
  Filled 2014-05-03 (×2): qty 1

## 2014-05-03 MED ORDER — METOPROLOL TARTRATE 25 MG PO TABS: 12.5000 mg | ORAL_TABLET | Freq: Two times a day (BID) | ORAL | Status: DC

## 2014-05-03 MED ORDER — FERROUS SULFATE 325 (65 FE) MG PO TABS: 325.0000 mg | ORAL_TABLET | Freq: Every day | ORAL | Status: DC

## 2014-05-03 MED ORDER — BUDESONIDE-FORMOTEROL FUMARATE 160-4.5 MCG/ACT IN AERO: 2.0000 | INHALATION_SPRAY | Freq: Two times a day (BID) | RESPIRATORY_TRACT | Status: DC

## 2014-05-03 NOTE — Discharge Instructions (Signed)
Activity: 1.May walk up steps °               2.No lifting more than ten pounds for four weeks.  °               3.No driving for four weeks. °               4.Stop any activity that causes chest pain, shortness of breath, dizziness, sweating or excessive weakness. °               5.Avoid straining. °               6.Continue with your breathing exercises daily. ° °Diet: Diabetic diet and Low fat, Low salt diet ° °Wound Care: May shower.  Clean wounds with mild soap and water daily. Contact the office at [phone] if any problems arise. ° °Coronary Artery Bypass Grafting, Care After °Refer to this sheet in the next few weeks. These instructions provide you with information on caring for yourself after your procedure. Your health care provider may also give you more specific instructions. Your treatment has been planned according to current medical practices, but problems sometimes occur. Call your health care provider if you have any problems or questions after your procedure. °WHAT TO EXPECT AFTER THE PROCEDURE °Recovery from surgery will be different for everyone. Some people feel well after 3 or 4 weeks, while for others it takes longer. After your procedure, it is typical to have the following: °· Nausea and a lack of appetite.   °· Constipation. °· Weakness and fatigue.   °· Depression or irritability.   °· Pain or discomfort at your incision site. °HOME CARE INSTRUCTIONS °· Take medicines only as directed by your health care provider. Do not stop taking medicines or start any new medicines without first checking with your health care provider. °· Take your pulse as directed by your health care provider. °· Perform deep breathing as directed by your health care provider. If you were given a device called an incentive spirometer, use it to practice deep breathing several times a day. Support your chest with a pillow or your arms when you take deep breaths or cough. °· Keep incision areas clean, dry, and  protected. Remove or change any bandages (dressings) only as directed by your health care provider. You may have skin adhesive strips over the incision areas. Do not take the strips off. They will fall off on their own. °· Check incision areas daily for any swelling, redness, or drainage. °· If incisions were made in your legs, do the following: °¨ Avoid crossing your legs.   °¨ Avoid sitting for long periods of time. Change positions every 30 minutes.   °¨ Elevate your legs when you are sitting. °· Wear compression stockings as directed by your health care provider. These stockings help keep blood clots from forming in your legs. °· Take showers once your health care provider approves. Until then, only take sponge baths. Pat incisions dry. Do not rub incisions with a washcloth or towel. Do not take baths, swim, or use a hot tub until your health care provider approves. °· Eat foods that are high in fiber, such as raw fruits and vegetables, whole grains, beans, and nuts. Meats should be lean cut. Avoid canned, processed, and fried foods. °· Drink enough fluid to keep your urine clear or pale yellow. °· Weigh yourself every day. This helps identify if you are retaining fluid that may make your heart and lungs   work harder.  Rest and limit activity as directed by your health care provider. You may be instructed to:  Stop any activity at once if you have chest pain, shortness of breath, irregular heartbeats, or dizziness. Get help right away if you have any of these symptoms.  Move around frequently for short periods or take short walks as directed by your health care provider. Increase your activities gradually. You may need physical therapy or cardiac rehabilitation to help strengthen your muscles and build your endurance.  Avoid lifting, pushing, or pulling anything heavier than 10 lb (4.5 kg) for at least 6 weeks after surgery.  Do not drive until your health care provider approves.  Ask your health  care provider when you may return to work.  Ask your health care provider when you may resume sexual activity.  Keep all follow-up visits as directed by your health care provider. This is important. SEEK MEDICAL CARE IF:  You have swelling, redness, increasing pain, or drainage at the site of an incision.  You have a fever.  You have swelling in your ankles or legs.  You have pain in your legs.   You gain 2 or more pounds (0.9 kg) a day.  You are nauseous or vomit.  You have diarrhea. SEEK IMMEDIATE MEDICAL CARE IF:  You have chest pain that goes to your jaw or arms.  You have shortness of breath.   You have a fast or irregular heartbeat.   You notice a "clicking" in your breastbone (sternum) when you move.   You have numbness or weakness in your arms or legs.  You feel dizzy or light-headed.  MAKE SURE YOU:  Understand these instructions.  Will watch your condition.  Will get help right away if you are not doing well or get worse. Document Released: 02/16/2005 Document Revised: 12/14/2013 Document Reviewed: 01/06/2013 [facility] Patient Information 2015 [place], [place]. This information is not intended to replace advice given to you by your health care provider. Make sure you discuss any questions you have with your health care provider.  Diabetes Mellitus and Food It is important for you to manage your blood sugar (glucose) level. Your blood glucose level can be greatly affected by what you eat. Eating healthier foods in the appropriate amounts throughout the day at about the same time each day will help you control your blood glucose level. It can also help slow or prevent worsening of your diabetes mellitus. Healthy eating may even help you improve the level of your blood pressure and reach or maintain a healthy weight.  HOW CAN FOOD AFFECT ME? Carbohydrates Carbohydrates affect your blood glucose level more than any other type of food. Your dietitian will help  you determine how many carbohydrates to eat at each meal and teach you how to count carbohydrates. Counting carbohydrates is important to keep your blood glucose at a healthy level, especially if you are using insulin or taking certain medicines for diabetes mellitus. Alcohol Alcohol can cause sudden decreases in blood glucose (hypoglycemia), especially if you use insulin or take certain medicines for diabetes mellitus. Hypoglycemia can be a life-threatening condition. Symptoms of hypoglycemia (sleepiness, dizziness, and disorientation) are similar to symptoms of having too much alcohol.  If your health care provider has given you approval to drink alcohol, do so in moderation and use the following guidelines:  Women should not have more than one drink per day, and men should not have more than two drinks per day. One drink is equal  to:  12 oz of beer.  5 oz of wine.  1 oz of hard liquor.  Do not drink on an empty stomach.  Keep yourself hydrated. Have water, diet soda, or unsweetened iced tea.  Regular soda, juice, and other mixers might contain a lot of carbohydrates and should be counted. WHAT FOODS ARE NOT RECOMMENDED? As you make food choices, it is important to remember that all foods are not the same. Some foods have fewer nutrients per serving than other foods, even though they might have the same number of calories or carbohydrates. It is difficult to get your body what it needs when you eat foods with fewer nutrients. Examples of foods that you should avoid that are high in calories and carbohydrates but low in nutrients include:  Trans fats (most processed foods list trans fats on the Nutrition Facts label).  Regular soda.  Juice.  Candy.  Sweets, such as cake, pie, doughnuts, and cookies.  Fried foods. WHAT FOODS CAN I EAT? Have nutrient-rich foods, which will nourish your body and keep you healthy. The food you should eat also will depend on several factors,  including:  The calories you need.  The medicines you take.  Your weight.  Your blood glucose level.  Your blood pressure level.  Your cholesterol level. You also should eat a variety of foods, including:  Protein, such as meat, poultry, fish, tofu, nuts, and seeds (lean animal proteins are best).  Fruits.  Vegetables.  Dairy products, such as milk, cheese, and yogurt (low fat is best).  Breads, grains, pasta, cereal, rice, and beans.  Fats such as olive oil, trans fat-free margarine, canola oil, avocado, and olives. DOES EVERYONE WITH DIABETES MELLITUS HAVE THE SAME MEAL PLAN? Because every person with diabetes mellitus is different, there is not one meal plan that works for everyone. It is very important that you meet with a dietitian who will help you create a meal plan that is just right for you. Document Released: 04/26/2005 Document Revised: 08/04/2013 Document Reviewed: 06/26/2013 [facility] Patient Information 2015 [place], [place]. This information is not intended to replace advice given to you by your health care provider. Make sure you discuss any questions you have with your health care provider.

## 2014-05-03 NOTE — Progress Notes (Signed)
05/03/2014 12:00 PM Nursing note EPW d/c per order and per protocol. Ends intact. Pt. Tolerated well. vss and collected per protocol. Pt. Advised br x 1 hr. Call bell within reach. cts removed per order and benzoin and steri strips applied per protocol.  Marciano Mundt, Arville Lime

## 2014-05-03 NOTE — Progress Notes (Signed)
CARDIAC REHAB PHASE I   PRE:  Rate/Rhythm: 72 SR  BP:  Supine:   Sitting: 127/50  Standing:    SaO2: 97 RA  MODE:  Ambulation: 550 ft   POST:  Rate/Rhythm: 96 SR  BP:  Supine:   Sitting: 152/44  Standing:    SaO2: 98 RA 1030-1150 Pt tolerated ambulation well without c/o. He was able to walk 550 feet. VS stable Pt to recliner after walk with call light in reach. Completed discharge education with pt and wife. They voice understanding. He agrees to [facility]. CRP in [place], will send referral. I discussed smoking cessation with pt. He quit two months ago, cold [place] and plans not to smoke again. I gave him tips for quitting, coaching contact number and quit smart class information. Placed recovery from heart surgery video for pt and wife to watch.  Rodney Langton RN 05/03/2014 11:50 AM

## 2014-05-03 NOTE — Progress Notes (Signed)
05/03/2014 2:42 PM Nursing note Discharge avs form, medications already taken today and those due this evening given and explained to patient and family member. Follow up appointments, incision site care and when to call MD reviewed. Activity restrictions reviewed. Moving right along book given and reviewed with patient. Pt. And family viewed video #113. Questions and concerns addressed. PICC d/c by IV team. D/c tele at 1500 per post EPW removal protocol and pt. D/c home per orders.  Kerith Sherley, Arville Lime

## 2014-05-03 NOTE — Progress Notes (Addendum)
      [address]       [place] [address]             [phone]        7 Days Post-Op Procedure(s) (LRB): INTRAOPERATIVE TRANSESOPHAGEAL ECHOCARDIOGRAM (N/A) CORONARY ARTERY BYPASS GRAFTING (CABG), on pump, times four, using left internal mammary artery, right greater saphenous vein harvested endoscopically. (N/A)  Subjective: Patient awaiting breakfast. States his nausea is better.  Objective: Vital signs in last 24 hours: Temp:  [97.9 F (36.6 C)-98.4 F (36.9 C)] 98.4 F (36.9 C) (09/21 0613) Pulse Rate:  [67-99] 67 (09/21 3976) Cardiac Rhythm:  [-] Normal sinus rhythm (09/20 2045) Resp:  [18-20] 20 (09/21 0613) BP: (113-130)/(45-48) 113/48 mmHg (09/21 0613) SpO2:  [98 %-100 %] 98 % (09/21 0613) Weight:  [268 lb 8 oz (121.791 kg)] 268 lb 8 oz (121.791 kg) (09/21 0613)  Pre op weight 117 kg Current Weight  05/03/14 268 lb 8 oz (121.791 kg)      Intake/Output from previous day: 09/20 0701 - 09/21 0700 In: 720 [P.O.:720] Out: 2750 [Urine:2750]   Physical Exam:  Cardiovascular: RRR, no murmurs, gallops, or rubs. Pulmonary: Clear to auscultation bilaterally; no rales, wheezes, or rhonchi. Abdomen: Soft, non tender, obese,bowel sounds present. Extremities: 2++ bilateral lower extremity edema. Wounds: Clean and dry.  No erythema or signs of infection.  Lab Results: CBC: Recent Labs  05/01/14 0610 05/03/14 0455  WBC 7.7 10.0  HGB 8.2* 8.9*  HCT 24.7* 26.9*  PLT 220 315   BMET:  Recent Labs  05/01/14 0610 05/03/14 0455  NA 132* 134*  K 4.6 4.7  CL 92* 94*  CO2 29 29  GLUCOSE 164* 150*  BUN 35* 35*  CREATININE 0.99 1.09  CALCIUM 8.7 8.8    PT/INR:  Lab Results  Component Value Date   INR 1.34 04/26/2014   INR 1.02 04/22/2014   ABG:  INR: Will add last result for INR, ABG once components are confirmed Will add last 4 CBG results once components are confirmed  Assessment/Plan:  1. CV - Previous a fib. Maintaining SR in  the 60's. On Amiodarone 400 bid, Lopressor 12.5 bid, and Lisinopril 2.5 daily. HR in the 60's so will decrease Amiodarone. 2.  Pulmonary - Encourage incentive spirometer 3. Volume Overload - On Lasix 40 daily. Will need at discharge as still with ++ LE edema. 4.  Acute blood loss anemia - H and H stable at 8.9 and 26.9. Previous transfusion. Continue Trinsicon. 5. DM-CBGs 258/261/141. On Insulin. Restart Januvia for better glucose control. Pre op HGA1C 9.6. Needs follow up with medical doctor after discharge. 6. Remove EPW and CT sutures 7. Nausea likely related to Amiodarone. Nausea is improved so will decrease and not stop at this time. 8. CPAP for OSA 9. Remove PICC line 10. Will discuss disposition with surgeon.  Robley Matassa MPA-C 05/03/2014,7:31 AM

## 2014-05-03 NOTE — Progress Notes (Signed)
Right upper arm PICC removed intact. Patient tolerated procedure well. Instructed patient to remain supine for 30 mins. after PICC removed to prevent complications. Instructed patient pressure dressing may be removed in 24 hours. Pressure dressing applied and no bleeding noted. Tasia Catchings RN [facility]

## 2014-05-06 NOTE — Discharge Summary (Signed)
patient examined and medical record reviewed,agree with above note. VAN TRIGT III,Braedon Sjogren 05/06/2014

## 2014-05-20 ENCOUNTER — Encounter: Payer: Self-pay | Admitting: Cardiovascular Disease

## 2014-05-20 ENCOUNTER — Ambulatory Visit (INDEPENDENT_AMBULATORY_CARE_PROVIDER_SITE_OTHER): Payer: BC Managed Care – PPO | Admitting: Cardiovascular Disease

## 2014-05-20 VITALS — BP 92/70 | HR 86 | Ht 69.0 in | Wt 252.5 lb

## 2014-05-20 DIAGNOSIS — Z951 Presence of aortocoronary bypass graft: Secondary | ICD-10-CM

## 2014-05-20 DIAGNOSIS — E7800 Pure hypercholesterolemia, unspecified: Secondary | ICD-10-CM

## 2014-05-20 DIAGNOSIS — R0602 Shortness of breath: Secondary | ICD-10-CM

## 2014-05-20 DIAGNOSIS — I4891 Unspecified atrial fibrillation: Secondary | ICD-10-CM

## 2014-05-20 DIAGNOSIS — I25118 Atherosclerotic heart disease of native coronary artery with other forms of angina pectoris: Secondary | ICD-10-CM

## 2014-05-20 DIAGNOSIS — E785 Hyperlipidemia, unspecified: Secondary | ICD-10-CM

## 2014-05-20 DIAGNOSIS — I9789 Other postprocedural complications and disorders of the circulatory system, not elsewhere classified: Secondary | ICD-10-CM

## 2014-05-20 DIAGNOSIS — I10 Essential (primary) hypertension: Secondary | ICD-10-CM

## 2014-05-20 MED ORDER — ROSUVASTATIN CALCIUM 5 MG PO TABS: 5.0000 mg | ORAL_TABLET | Freq: Every day | ORAL | Status: DC

## 2014-05-20 MED ORDER — AMIODARONE HCL 200 MG PO TABS: ORAL_TABLET | ORAL | Status: DC

## 2014-05-20 NOTE — Progress Notes (Signed)
Primary care physician: Dr. Damita Dunnings  HPI  This is a pleasant 63 year old man who is here today for followup visit regarding coronary artery disease and recent CABG.  He has known history of diabetes for at least 25 years of duration which has not been optimally controlled. He also has other medical conditions that include  Hyperlipidemia, tobacco use, obesity and prostate cancer. He was seen in September for new onset progressive angina . I proceeded with cardiac catheterization which showed severe three-vessel and moderate left main disease. Ejection fraction was 45%. He underwent [facility] with LIMA to LAD, SVG to DIAGONAL, SVG to OM1, SVG to PDA. He had postoperative atrial fibrillation which was treated with amiodarone. He has been doing well since hospital discharge with resolution of substernal chest tightness. The chest pain at the wound seems to be improving    Allergies  Allergen Reactions  . Metformin And Related Nausea Only  . Statins     REACTION: JOINT ACHES     Current Outpatient Prescriptions on File Prior to Visit  Medication Sig Dispense Refill  . amiodarone (PACERONE) 200 MG tablet Take 1 tablet (200 mg total) by mouth 2 (two) times daily. For one week then take Amiodarone 200 mg by mouth daily thereafter.  30 tablet  1  . aspirin EC 325 MG EC tablet Take 1 tablet (325 mg total) by mouth daily.  30 tablet  0  . bicalutamide (CASODEX) 50 MG tablet Take 50 mg by mouth daily.      . budesonide-formoterol (SYMBICORT) 160-4.5 MCG/ACT inhaler Inhale 2 puffs into the lungs 2 (two) times daily.  1 Inhaler  1  . ferrous sulfate 325 (65 FE) MG tablet Take 1 tablet (325 mg total) by mouth daily with breakfast. For one month then stop.    3  . furosemide (LASIX) 40 MG tablet Take 1 tablet (40 mg total) by mouth daily. For 10 days then stop.  10 tablet  0  . glucose blood test strip by Other route. Use daily as ordered. Code: 250.00       . Insulin Pen Needle 31G X 8 MM MISC Use as  directed to inject blood sugar twice daily dx:250.00  100 each  12  . leuprolide, 6 Month, (LEUPROLIDE ACETATE, 6 MONTH,) 45 MG injection Inject 45 mg into the skin every 6 (six) months.      Marland Kitchen lisinopril (ZESTRIL) 2.5 MG tablet Take 1 tablet (2.5 mg total) by mouth daily.  90 tablet  3  . metoprolol tartrate (LOPRESSOR) 25 MG tablet Take 0.5 tablets (12.5 mg total) by mouth 2 (two) times daily.  30 tablet  1  . oxyCODONE (OXY IR/ROXICODONE) 5 MG immediate release tablet Take 1-2 tablets (5-10 mg total) by mouth every 4 (four) hours as needed for severe pain.  30 tablet  0  . potassium chloride SA (K-DUR,KLOR-CON) 20 MEQ tablet Take 1 tablet (20 mEq total) by mouth daily. For 10 days then stop.  10 tablet  0  . sitaGLIPtin (JANUVIA) 50 MG tablet Take 1 tablet (50 mg total) by mouth daily.  90 tablet  1   No current facility-administered medications on file prior to visit.     Past Medical History  Diagnosis Date  . Diabetes mellitus 2000    Type II  . Hypertension 2000  . Environmental allergies   . Pneumonia     x3 as an adult-  last time 2009  . Arthritis   . Hyperlipidemia   .  History of chicken pox   . Syncope and collapse 04/11/14  . Stroke     H/o pontine CVA, prev eval by Dr. Erling Cruz  . Shortness of breath     sometimes, with exertion  . Coronary artery disease   . 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 Prostate biopsy  04/03/06 & 02/25/07    x2  . High intense focused ultrasound  07/20/06    By Dr. Jacqlyn Larsen  . Cytoscopy prostatic stone o/w nml  06/21/08    Dr. Jacqlyn Larsen  . Ett myoview  09/13/09    Low risk EF 49%  . Anterior cervical decomp/discectomy fusion  08/25/2012    Procedure: ANTERIOR CERVICAL DECOMPRESSION/DISCECTOMY FUSION 2 LEVELS;  Surgeon: Hosie Spangle, MD;  Location: [facility];  Service: Neurosurgery;  Laterality: N/A;  Cervical five-six Cervical six-seven anterior cervical decompression with fusion and  plating and bonegraft  . Intraoperative transesophageal echocardiogram N/A 04/26/2014    Procedure: INTRAOPERATIVE TRANSESOPHAGEAL ECHOCARDIOGRAM;  Surgeon: Ivin Poot, MD;  Location: [facility];  Service: Open Heart Surgery;  Laterality: N/A;  . Coronary artery bypass graft N/A 04/26/2014    Procedure: CORONARY ARTERY BYPASS GRAFTING (CABG), on pump, times four, using left internal mammary artery, right greater saphenous vein harvested endoscopically.;  Surgeon: Ivin Poot, MD;  Location: [facility];  Service: Open Heart Surgery;  Laterality: N/A;  LIMA to LAD, SVG to DIAGONAL, SVG to OM1, SVG to PDA) with EVH of the RIGHT THIGH and LOWER EXTREMITY SAPHENOUS VEIN     Family History  Problem Relation Age of Onset  . Diabetes Mother 64    DM  . Coronary artery disease Mother   . Hypertension Mother   . Heart attack Mother     CABG with MI in 2005  . Heart disease Mother     CV  . Hypertension Father   . Heart attack Father     CABG with Mi around 1997  . Coronary artery disease Father   . Heart disease Father     CV  . Heart attack Brother     MI/PTCA  . Heart disease Brother     CV  . Heart attack Maternal Grandfather     MI  . Prostate cancer Paternal Grandfather   . Breast cancer Neg Hx     Breast/ovarian/uterine cancer  . Colon cancer Neg Hx   . Depression Neg Hx   . Alcohol abuse Neg Hx     ETOH/drug abuse  . Stroke Neg Hx   . Diabetes Sister     DM     History   Social History  . Marital Status: Married    Spouse Name: N/A    Number of Children: 3  . Years of Education: N/A   Occupational History  . Insurance account manager    Social History Main Topics  . Smoking status: Former Smoker -- 1.00 packs/day for 36 years  . Smokeless tobacco: Former Systems developer    Quit date: 03/01/2014     Comment: quit 03/2014  . Alcohol Use: Yes     Comment: occassionally  . Drug Use: No  . Sexual Activity: Not on file   Other Topics Concern  . Not on  file   Social History Narrative   Lives with wife.   2 daughters and 1 son.   [employer] fan   [employer]     ROS A 10 point review of system was performed. It is negative other than that mentioned in the history of present illness.   PHYSICAL EXAM   BP 92/70  Pulse 86  Ht 5\' 9"  (1.753 m)  Wt 252 lb 8 oz (114.533 kg)  BMI 37.27 kg/m2 Constitutional: He is oriented to person, place, and time. He appears well-developed and well-nourished. No distress.  HENT: No nasal discharge.  Head: Normocephalic and atraumatic.  Eyes: Pupils are equal and round.  No discharge. Neck: Normal range of motion. Neck supple. No JVD present. No thyromegaly present.  Cardiovascular: Normal rate, regular rhythm, normal heart sounds. Exam reveals no gallop and no friction rub. No murmur heard.  Pulmonary/Chest: Effort normal and breath sounds normal. No stridor. No respiratory distress. He has no wheezes. He has no rales. He exhibits no tenderness.  Abdominal: Soft. Bowel sounds are normal. He exhibits no distension. There is no tenderness. There is no rebound and no guarding.  Musculoskeletal: Normal range of motion. He exhibits no edema and no tenderness.  Neurological: He is alert and oriented to person, place, and time. Coordination normal.  Skin: Skin is warm and dry. No rash noted. He is not diaphoretic. No erythema. No pallor.  Psychiatric: He has a normal mood and affect. His behavior is normal. Judgment and thought content normal.       MCN:OBSJG  Rhythm  -Anterior infarct -age undetermined.  Anterior T wave changes suggestive of ischemia ABNORMAL    ASSESSMENT AND PLAN

## 2014-05-20 NOTE — Patient Instructions (Signed)
Your physician has recommended you make the following change in your medication:  Stop Amiodarone in 1 week  Start Crestor 5 mg once daily   You have been referred to Cardiac Rehab  They will call with a date and time.  Your physician recommends that you schedule a follow-up appointment in:  2 months   Your physician recommends that you return for lab work in:  Fasting Lipid and Liver panel at your 2 month appt

## 2014-05-21 ENCOUNTER — Encounter: Payer: Self-pay | Admitting: Cardiovascular Disease

## 2014-05-21 ENCOUNTER — Encounter: Payer: BC Managed Care – PPO | Admitting: Cardiovascular Disease

## 2014-05-21 DIAGNOSIS — E785 Hyperlipidemia, unspecified: Secondary | ICD-10-CM | POA: Insufficient documentation

## 2014-05-21 DIAGNOSIS — I2510 Atherosclerotic heart disease of native coronary artery without angina pectoris: Secondary | ICD-10-CM | POA: Insufficient documentation

## 2014-05-23 DIAGNOSIS — I4891 Unspecified atrial fibrillation: Secondary | ICD-10-CM | POA: Insufficient documentation

## 2014-05-23 DIAGNOSIS — I9789 Other postprocedural complications and disorders of the circulatory system, not elsewhere classified: Secondary | ICD-10-CM | POA: Insufficient documentation

## 2014-05-23 NOTE — Assessment & Plan Note (Signed)
Blood pressure is controlled on current medications. 

## 2014-05-23 NOTE — Assessment & Plan Note (Signed)
He is doing reasonably well overall with resolution of angina. Continue medical therapy. I discussed with him the importance of healthy lifestyle changes. I referred him to cardiac rehabilitation at [facility].

## 2014-05-23 NOTE — Assessment & Plan Note (Signed)
He is maintaining in sinus rhythm. The patient isn't U. to have an injection of Leuprolide for prostate cancer. The and prolong QT interval. Thus, it should be avoided with amiodarone. I asked him to continue amiodarone for only another week and then stop. Technically, amiodarone has a very long half-life. However, his QT currently is normal and I think the benefit of continuing treatment of his cancer outweighs the risks.

## 2014-05-23 NOTE — Assessment & Plan Note (Signed)
He is intolerant to statins. Thus, I initiated treatment with small dose Crestor 5 mg once daily. Check labs in 2 months.

## 2014-05-28 ENCOUNTER — Telehealth: Payer: Self-pay

## 2014-05-28 NOTE — Telephone Encounter (Signed)
Informed patient that orders are waiting to be signed and he should be contacted by cardiac rehab next week

## 2014-05-28 NOTE — Telephone Encounter (Signed)
Pt states he has not heard back from cardiac rehab. Please call.

## 2014-05-31 ENCOUNTER — Telehealth: Payer: Self-pay | Admitting: *Deleted

## 2014-05-31 NOTE — Telephone Encounter (Signed)
Order faxed to cardiac rehab 

## 2014-06-01 ENCOUNTER — Other Ambulatory Visit: Payer: Self-pay | Admitting: Cardiothoracic Surgery

## 2014-06-01 DIAGNOSIS — I25119 Atherosclerotic heart disease of native coronary artery with unspecified angina pectoris: Secondary | ICD-10-CM

## 2014-06-02 ENCOUNTER — Ambulatory Visit (INDEPENDENT_AMBULATORY_CARE_PROVIDER_SITE_OTHER): Payer: Self-pay | Admitting: Cardiothoracic Surgery

## 2014-06-02 ENCOUNTER — Ambulatory Visit
Admission: RE | Admit: 2014-06-02 | Discharge: 2014-06-02 | Disposition: A | Discharge status: Home / Self Care | Payer: BC Managed Care – PPO | Source: Ambulatory Visit | Attending: Cardiothoracic Surgery | Admitting: Cardiothoracic Surgery

## 2014-06-02 ENCOUNTER — Encounter: Payer: Self-pay | Admitting: Cardiothoracic Surgery

## 2014-06-02 VITALS — BP 114/74 | HR 92 | Resp 20 | Ht 69.0 in | Wt 252.0 lb

## 2014-06-02 DIAGNOSIS — I25119 Atherosclerotic heart disease of native coronary artery with unspecified angina pectoris: Secondary | ICD-10-CM

## 2014-06-02 DIAGNOSIS — Z951 Presence of aortocoronary bypass graft: Secondary | ICD-10-CM

## 2014-06-02 NOTE — Progress Notes (Signed)
PCP is Elsie Stain, MD Referring Provider is Wellington Hampshire, MD  Chief Complaint  Patient presents with  . Routine Post Op    4 week f/u from surgery with CXR S/P CABG x4 on 04/26/14    HPI: One followup after CABG x4-unstable angina with three-vessel CAD. Patient had brief atrial fibrillation postop converted to sinus with amiodarone. The amiodarone has been discontinued by his cardiologist, Dr. Fletcher Anon. The patient has no recurrent angina. The surgical incisions are healing well. He is walking and increasing his activity level. He has no symptoms of CHF. His chest x-ray today shows clear lung fields without pleural effusion.   Past Medical History  Diagnosis Date  . Diabetes mellitus 2000    Type II  . Hypertension 2000  . Environmental allergies   . Pneumonia     x3 as an adult-  last time 2009  . Arthritis   . History of chicken pox   . Syncope and collapse 04/11/14  . Stroke     H/o pontine CVA, prev eval by Dr. Erling Cruz  . Shortness of breath     sometimes, with exertion  . 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 Coronary artery disease September of 2015    New onset angina. Cardiac catheterization showed severe three-vessel and moderate left main disease. Ejection fraction was 45%. [facility] with LIMA to LAD, SVG to DIAGONAL, SVG to OM1, SVG to PDA  . Hyperlipidemia     Past Surgical History  Procedure Laterality Date  . Prostate biopsy  04/03/06 & 02/25/07    x2  . High intense focused ultrasound  07/20/06    By Dr. Jacqlyn Larsen  . Cytoscopy prostatic stone o/w nml  06/21/08    Dr. Jacqlyn Larsen  . Ett myoview  09/13/09    Low risk EF 49%  . Anterior cervical decomp/discectomy fusion  08/25/2012    Procedure: ANTERIOR CERVICAL DECOMPRESSION/DISCECTOMY FUSION 2 LEVELS;  Surgeon: Hosie Spangle, MD;  Location: [place] [facility];  Service: Neurosurgery;  Laterality: N/A;  Cervical five-six Cervical six-seven anterior cervical decompression with fusion and plating and bonegraft   . Intraoperative transesophageal echocardiogram N/A 04/26/2014    Procedure: INTRAOPERATIVE TRANSESOPHAGEAL ECHOCARDIOGRAM;  Surgeon: Ivin Poot, MD;  Location: [place];  Service: Open Heart Surgery;  Laterality: N/A;  . Coronary artery bypass graft N/A 04/26/2014    Procedure: CORONARY ARTERY BYPASS GRAFTING (CABG), on pump, times four, using left internal mammary artery, right greater saphenous vein harvested endoscopically.;  Surgeon: Ivin Poot, MD;  Location: [place];  Service: Open Heart Surgery;  Laterality: N/A;  LIMA to LAD, SVG to DIAGONAL, SVG to OM1, SVG to PDA) with EVH of the RIGHT THIGH and LOWER EXTREMITY SAPHENOUS VEIN  . Cardiac catheterization      Family History  Problem Relation Age of Onset  . Diabetes Mother 73    DM  . Coronary artery disease Mother   . Hypertension Mother   . Heart attack Mother     CABG with MI in 2005  . Heart disease Mother     CV  . Hypertension Father   . Heart attack Father     CABG with Mi around 1997  . Coronary artery disease Father   . Heart disease Father     CV  . Heart attack Brother     MI/PTCA  . Heart disease Brother     CV  . Heart attack Maternal Grandfather  MI  . Prostate cancer Paternal Grandfather   . Breast cancer Neg Hx     Breast/ovarian/uterine cancer  . Colon cancer Neg Hx   . Depression Neg Hx   . Alcohol abuse Neg Hx     ETOH/drug abuse  . Stroke Neg Hx   . Diabetes Sister     DM    Social History History  Substance Use Topics  . Smoking status: Former Smoker -- 1.00 packs/day for 36 years  . Smokeless tobacco: Former Systems developer    Quit date: 03/01/2014     Comment: quit 03/2014  . Alcohol Use: Yes     Comment: occassionally    Current Outpatient Prescriptions  Medication Sig Dispense Refill  . aspirin EC 325 MG EC tablet Take 1 tablet (325 mg total) by mouth daily.  30 tablet  0  . bicalutamide (CASODEX) 50 MG tablet Take 50 mg by mouth daily.      . budesonide-formoterol (SYMBICORT)  160-4.5 MCG/ACT inhaler Inhale 2 puffs into the lungs 2 (two) times daily.  1 Inhaler  1  . ferrous sulfate 325 (65 FE) MG tablet Take 1 tablet (325 mg total) by mouth daily with breakfast. For one month then stop.    3  . glucose blood test strip by Other route. Use daily as ordered. Code: 250.00       . insulin glargine (LANTUS) 100 UNIT/ML injection Inject 80 Units into the skin daily. Disp 15 pens      . Insulin Pen Needle 31G X 8 MM MISC Use as directed to inject blood sugar twice daily dx:250.00  100 each  12  . leuprolide (LUPRON) 30 MG injection Inject 45 mg into the muscle every 6 (six) months.      Marland Kitchen lisinopril (ZESTRIL) 2.5 MG tablet Take 1 tablet (2.5 mg total) by mouth daily.  90 tablet  3  . metoprolol tartrate (LOPRESSOR) 25 MG tablet Take 0.5 tablets (12.5 mg total) by mouth 2 (two) times daily.  30 tablet  1  . oxyCODONE (OXY IR/ROXICODONE) 5 MG immediate release tablet Take 1-2 tablets (5-10 mg total) by mouth every 4 (four) hours as needed for severe pain.  30 tablet  0  . rosuvastatin (CRESTOR) 5 MG tablet Take 1 tablet (5 mg total) by mouth daily.  30 tablet  6  . sitaGLIPtin (JANUVIA) 50 MG tablet Take 1 tablet (50 mg total) by mouth daily.  90 tablet  1   No current facility-administered medications for this visit.    Allergies  Allergen Reactions  . Metformin And Related Nausea Only  . Statins     REACTION: JOINT ACHES    Review of Systems patient is ready start cardiac rehabilitation at [facility]. He is currently retired. He has had no clicks or pops in his sternum indicating malunion. 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 Patient will continue his current blood pressure medications and a refill for OxyIR was provided. Given a prescription for Neurontin for his bilateral hand numbness. The patient can resume driving  and lifting up to 20 pounds maximum.  Plan: Return for followup and further monitoring in 2 months.

## 2014-06-07 ENCOUNTER — Encounter: Payer: Self-pay | Admitting: *Deleted

## 2014-06-07 NOTE — Progress Notes (Signed)
Patient ID: Phillip Mimes Sr., male   DOB: 1950-10-19, 63 y.o.   MRN:                                                                         Referral faxed to [facility] cardiac rehab to notify [name] to begin Phase II per Dr. Lucianne Lei Trigt's request.

## 2014-06-15 ENCOUNTER — Encounter: Payer: Self-pay | Admitting: Cardiothoracic Surgery

## 2014-07-06 ENCOUNTER — Telehealth: Payer: Self-pay | Admitting: Cardiovascular Disease

## 2014-07-06 DIAGNOSIS — I10 Essential (primary) hypertension: Secondary | ICD-10-CM

## 2014-07-06 NOTE — Telephone Encounter (Signed)
Scheduled patient for labs and to see Dr. Fletcher Anon  Instructed patient to contact EMS if he feels his situation becomes emergent  Patient verbalized understanding

## 2014-07-06 NOTE — Telephone Encounter (Signed)
Schedule an appointment. In the meanwhile, check labs (CBC, BMP, BNP and TSH) to evaluate dyspnea. If labs are unremarkable, we will need to consider a stress test.

## 2014-07-06 NOTE — Telephone Encounter (Signed)
Pt had a bypass and lately has not been feeling well, he thinks he is just not feeling well, he has been going to rehab and was doing well in the beginning and now is not doing so well. He has been having shortness of breath, and has no stamina, does not have pains in chest but here and there get little sharp pains. This has been going on for the past few weeks, wanted to be seen but wanted to see what can we do. Please call patient.

## 2014-07-06 NOTE — Telephone Encounter (Signed)
Pt wife called, stating pt was having pain in arm now, and asking [place] to call pt. Call pt and he said he is not having pains, it is just here and there. Anytime he exercise it hurts.

## 2014-07-07 ENCOUNTER — Other Ambulatory Visit (INDEPENDENT_AMBULATORY_CARE_PROVIDER_SITE_OTHER): Payer: BC Managed Care – PPO

## 2014-07-07 DIAGNOSIS — E785 Hyperlipidemia, unspecified: Secondary | ICD-10-CM

## 2014-07-07 DIAGNOSIS — I4891 Unspecified atrial fibrillation: Secondary | ICD-10-CM

## 2014-07-07 DIAGNOSIS — E7800 Pure hypercholesterolemia, unspecified: Secondary | ICD-10-CM

## 2014-07-07 DIAGNOSIS — I9789 Other postprocedural complications and disorders of the circulatory system, not elsewhere classified: Secondary | ICD-10-CM

## 2014-07-07 DIAGNOSIS — I25118 Atherosclerotic heart disease of native coronary artery with other forms of angina pectoris: Secondary | ICD-10-CM

## 2014-07-07 DIAGNOSIS — I209 Angina pectoris, unspecified: Secondary | ICD-10-CM

## 2014-07-07 DIAGNOSIS — I739 Peripheral vascular disease, unspecified: Secondary | ICD-10-CM

## 2014-07-07 DIAGNOSIS — I10 Essential (primary) hypertension: Secondary | ICD-10-CM

## 2014-07-08 LAB — CBC WITH DIFFERENTIAL/PLATELET
Basophils Absolute: 0 10*3/uL (ref 0.0–0.2)
Basos: 0 %
Eos: 4 %
Eosinophils Absolute: 0.3 10*3/uL (ref 0.0–0.4)
HCT: 39 % (ref 37.5–51.0)
Hemoglobin: 13.3 g/dL (ref 12.6–17.7)
Immature Grans (Abs): 0 10*3/uL (ref 0.0–0.1)
Immature Granulocytes: 0 %
Lymphocytes Absolute: 1.4 10*3/uL (ref 0.7–3.1)
Lymphs: 20 %
MCH: 28.4 pg (ref 26.6–33.0)
MCHC: 34.1 g/dL (ref 31.5–35.7)
MCV: 83 fL (ref 79–97)
Monocytes Absolute: 0.6 10*3/uL (ref 0.1–0.9)
Monocytes: 8 %
Neutrophils Absolute: 5 10*3/uL (ref 1.4–7.0)
Neutrophils Relative %: 68 %
RBC: 4.69 x10E6/uL (ref 4.14–5.80)
RDW: 13.7 % (ref 12.3–15.4)
WBC: 7.3 10*3/uL (ref 3.4–10.8)

## 2014-07-08 LAB — BASIC METABOLIC PANEL
BUN/Creatinine Ratio: 17 (ref 10–22)
BUN: 16 mg/dL (ref 8–27)
CO2: 22 mmol/L (ref 18–29)
Calcium: 9.4 mg/dL (ref 8.6–10.2)
Chloride: 100 mmol/L (ref 97–108)
Creatinine, Ser: 0.95 mg/dL (ref 0.76–1.27)
GFR calc Af Amer: 98 mL/min/{1.73_m2} (ref >59)
GFR calc non Af Amer: 85 mL/min/{1.73_m2} (ref >59)
Glucose: 261 mg/dL — ABNORMAL HIGH (ref 65–99)
Potassium: 4.7 mmol/L (ref 3.5–5.2)
Sodium: 141 mmol/L (ref 134–144)

## 2014-07-08 LAB — TSH: TSH: 2.74 u[IU]/mL (ref 0.450–4.500)

## 2014-07-08 LAB — BRAIN NATRIURETIC PEPTIDE

## 2014-07-12 ENCOUNTER — Telehealth: Payer: Self-pay

## 2014-07-12 ENCOUNTER — Telehealth: Payer: Self-pay | Admitting: Cardiovascular Disease

## 2014-07-12 DIAGNOSIS — R0602 Shortness of breath: Secondary | ICD-10-CM

## 2014-07-12 MED ORDER — POTASSIUM CHLORIDE CRYS ER 20 MEQ PO TBCR: 20.0000 meq | EXTENDED_RELEASE_TABLET | Freq: Every day | ORAL | Status: DC

## 2014-07-12 MED ORDER — FUROSEMIDE 40 MG PO TABS: 40.0000 mg | ORAL_TABLET | Freq: Every day | ORAL | Status: DC

## 2014-07-12 NOTE — Telephone Encounter (Signed)
I spoke with the patient and made him aware of his lab results. His BNP was not drawn due to no frozen specimen being sent. I feels that his symptoms of SOB are related to fluid retention. He is having swelling in his legs that he feels may be up to his thighs. He is also reporting that his post surgical weight was 245 lbs. He is now up to 260 lbs and states he is not eating very much. Reviewed with Dr. Fletcher Anon- orders received to have the patient start lasix 40 mg once daily and potassium 20 meq once daily. He will need to keep his follow up appointment next week with Dr. Fletcher Anon on 07/23/14. We will hold off on scheduling a stress test until he is seen for follow up. I have advised him that he may go to cardiac rehab if he is feeling well enough to participate. He voices understanding. He will call back if symptoms should worsen prior to his appointment on 12/11.

## 2014-07-12 NOTE — Telephone Encounter (Signed)
[facility] asking for Dx codes for BMP and CBC, Please call.

## 2014-07-12 NOTE — Telephone Encounter (Signed)
Pt would like lab results. Not sure if he needs to go to rehab at 3:45 today or not. States the labs were supposed to determine if he goes to rehab or not. Please call and advise.

## 2014-07-13 ENCOUNTER — Encounter: Payer: Self-pay | Admitting: Cardiothoracic Surgery

## 2014-07-13 NOTE — Telephone Encounter (Signed)
Spoke with Phillip Clements with [facility] for new ICD 10- codes for labs.

## 2014-07-15 ENCOUNTER — Telehealth: Payer: Self-pay | Admitting: *Deleted

## 2014-07-15 DIAGNOSIS — R079 Chest pain, unspecified: Secondary | ICD-10-CM

## 2014-07-15 DIAGNOSIS — R0602 Shortness of breath: Secondary | ICD-10-CM

## 2014-07-15 MED ORDER — METOPROLOL TARTRATE 25 MG PO TABS: 12.5000 mg | ORAL_TABLET | Freq: Two times a day (BID) | ORAL | Status: DC

## 2014-07-15 NOTE — Telephone Encounter (Signed)
While reviewing Myoview instructions with patient he stated he is not sure what Metoprolol is and he is sure he has not been taking it

## 2014-07-15 NOTE — Telephone Encounter (Signed)
Informed patient of Dr. Jacklynn Ganong response  Metoprolol re ordered  Patient verbalized understanding

## 2014-07-15 NOTE — Telephone Encounter (Signed)
-----   Message from Wellington Hampshire, MD sent at 07/10/2014  1:27 AM EST ----- Inform patient that labs were normal. BNP was not done. Schedule treadmill Myoview.

## 2014-07-15 NOTE — Telephone Encounter (Signed)
[place]  Your caregiver has ordered a Stress Test with nuclear imaging. The purpose of this test is to evaluate the blood supply to your heart muscle. This procedure is referred to as a "Non-Invasive Stress Test." This is because other than having an IV started in your vein, nothing is inserted or "invades" your body. Cardiac stress tests are done to find areas of poor blood flow to the heart by determining the extent of coronary artery disease (CAD). Some patients exercise on a treadmill, which naturally increases the blood flow to your heart, while others who are  unable to walk on a treadmill due to physical limitations have a pharmacologic/chemical stress agent called Lexiscan . This medicine will mimic walking on a treadmill by temporarily increasing your coronary blood flow.   Please note: these test may take anywhere between 2-4 hours to complete  PLEASE REPORT TO [facility] AT THE FIRST DESK WILL DIRECT YOU WHERE TO GO  Date of Procedure:__________12/07/15___________________________  Arrival Time for Procedure:_________0745 am _____________________  Instructions regarding medication:   _x___ : Hold diabetes medication morning of procedure  _x___:  Hold betablocker(s) night before procedure and morning of procedure: Metoprolol     PLEASE NOTIFY THE OFFICE AT LEAST 24 HOURS IN ADVANCE IF YOU ARE UNABLE TO KEEP YOUR APPOINTMENT.  [phone] AND  PLEASE NOTIFY NUCLEAR MEDICINE AT [facility] AT LEAST 24 HOURS IN ADVANCE IF YOU ARE UNABLE TO KEEP YOUR APPOINTMENT. [phone]  How to prepare for your Myoview test:  1. Do not eat or drink after midnight 2. No caffeine for 24 hours prior to test 3. No smoking 24 hours prior to test. 4. Your medication may be taken with water.  If your doctor stopped a medication because of this test, do not take that medication. 5. Ladies, please do not wear dresses.  Skirts or pants are appropriate. Please wear a  short sleeve shirt. 6. No perfume, cologne or lotion. 7. Wear comfortable walking shoes. No heels!  Reviewed instructions with patient he verbalized understanding

## 2014-07-15 NOTE — Telephone Encounter (Signed)
Yes. Continue Metoprolol.

## 2014-07-19 ENCOUNTER — Other Ambulatory Visit: Payer: Self-pay | Admitting: Nurse Practitioner

## 2014-07-19 ENCOUNTER — Other Ambulatory Visit: Payer: Self-pay

## 2014-07-19 ENCOUNTER — Ambulatory Visit: Payer: Self-pay | Admitting: Cardiovascular Disease

## 2014-07-19 ENCOUNTER — Encounter: Payer: Self-pay | Admitting: Cardiovascular Disease

## 2014-07-19 ENCOUNTER — Inpatient Hospital Stay (HOSPITAL_COMMUNITY)
Admission: AD | Admit: 2014-07-19 | Discharge: 2014-07-23 | DRG: 247 | Disposition: A | Discharge status: Home / Self Care | Payer: BC Managed Care – PPO | Source: Other Acute Inpatient Hospital | Attending: Cardiovascular Disease | Admitting: Cardiovascular Disease

## 2014-07-19 ENCOUNTER — Encounter (HOSPITAL_COMMUNITY): Payer: Self-pay | Admitting: General Practice

## 2014-07-19 DIAGNOSIS — Z8673 Personal history of transient ischemic attack (TIA), and cerebral infarction without residual deficits: Secondary | ICD-10-CM

## 2014-07-19 DIAGNOSIS — Z7951 Long term (current) use of inhaled steroids: Secondary | ICD-10-CM | POA: Diagnosis not present

## 2014-07-19 DIAGNOSIS — Z79899 Other long term (current) drug therapy: Secondary | ICD-10-CM

## 2014-07-19 DIAGNOSIS — C61 Malignant neoplasm of prostate: Secondary | ICD-10-CM | POA: Diagnosis present

## 2014-07-19 DIAGNOSIS — Z6837 Body mass index (BMI) 37.0-37.9, adult: Secondary | ICD-10-CM | POA: Diagnosis not present

## 2014-07-19 DIAGNOSIS — F419 Anxiety disorder, unspecified: Secondary | ICD-10-CM | POA: Diagnosis present

## 2014-07-19 DIAGNOSIS — I959 Hypotension, unspecified: Secondary | ICD-10-CM | POA: Diagnosis not present

## 2014-07-19 DIAGNOSIS — M1990 Unspecified osteoarthritis, unspecified site: Secondary | ICD-10-CM | POA: Diagnosis present

## 2014-07-19 DIAGNOSIS — I249 Acute ischemic heart disease, unspecified: Secondary | ICD-10-CM | POA: Diagnosis present

## 2014-07-19 DIAGNOSIS — Z981 Arthrodesis status: Secondary | ICD-10-CM | POA: Diagnosis not present

## 2014-07-19 DIAGNOSIS — Z8042 Family history of malignant neoplasm of prostate: Secondary | ICD-10-CM

## 2014-07-19 DIAGNOSIS — I25118 Atherosclerotic heart disease of native coronary artery with other forms of angina pectoris: Secondary | ICD-10-CM | POA: Diagnosis present

## 2014-07-19 DIAGNOSIS — Z8546 Personal history of malignant neoplasm of prostate: Secondary | ICD-10-CM

## 2014-07-19 DIAGNOSIS — Z8249 Family history of ischemic heart disease and other diseases of the circulatory system: Secondary | ICD-10-CM | POA: Diagnosis not present

## 2014-07-19 DIAGNOSIS — I200 Unstable angina: Secondary | ICD-10-CM

## 2014-07-19 DIAGNOSIS — Z87891 Personal history of nicotine dependence: Secondary | ICD-10-CM | POA: Diagnosis not present

## 2014-07-19 DIAGNOSIS — I2510 Atherosclerotic heart disease of native coronary artery without angina pectoris: Secondary | ICD-10-CM

## 2014-07-19 DIAGNOSIS — I25700 Atherosclerosis of coronary artery bypass graft(s), unspecified, with unstable angina pectoris: Secondary | ICD-10-CM

## 2014-07-19 DIAGNOSIS — I10 Essential (primary) hypertension: Secondary | ICD-10-CM | POA: Diagnosis present

## 2014-07-19 DIAGNOSIS — C7989 Secondary malignant neoplasm of other specified sites: Secondary | ICD-10-CM | POA: Diagnosis present

## 2014-07-19 DIAGNOSIS — Z01812 Encounter for preprocedural laboratory examination: Secondary | ICD-10-CM

## 2014-07-19 DIAGNOSIS — Z794 Long term (current) use of insulin: Secondary | ICD-10-CM | POA: Diagnosis not present

## 2014-07-19 DIAGNOSIS — Z888 Allergy status to other drugs, medicaments and biological substances status: Secondary | ICD-10-CM | POA: Diagnosis not present

## 2014-07-19 DIAGNOSIS — E785 Hyperlipidemia, unspecified: Secondary | ICD-10-CM | POA: Diagnosis present

## 2014-07-19 DIAGNOSIS — R079 Chest pain, unspecified: Secondary | ICD-10-CM

## 2014-07-19 DIAGNOSIS — Z79891 Long term (current) use of opiate analgesic: Secondary | ICD-10-CM

## 2014-07-19 DIAGNOSIS — G4733 Obstructive sleep apnea (adult) (pediatric): Secondary | ICD-10-CM | POA: Diagnosis present

## 2014-07-19 DIAGNOSIS — Z951 Presence of aortocoronary bypass graft: Secondary | ICD-10-CM

## 2014-07-19 DIAGNOSIS — E119 Type 2 diabetes mellitus without complications: Secondary | ICD-10-CM | POA: Diagnosis present

## 2014-07-19 DIAGNOSIS — I639 Cerebral infarction, unspecified: Secondary | ICD-10-CM

## 2014-07-19 DIAGNOSIS — T82857A Stenosis of cardiac prosthetic devices, implants and grafts, initial encounter: Principal | ICD-10-CM | POA: Diagnosis present

## 2014-07-19 DIAGNOSIS — Z7982 Long term (current) use of aspirin: Secondary | ICD-10-CM

## 2014-07-19 DIAGNOSIS — M25512 Pain in left shoulder: Secondary | ICD-10-CM | POA: Diagnosis not present

## 2014-07-19 DIAGNOSIS — I739 Peripheral vascular disease, unspecified: Secondary | ICD-10-CM | POA: Diagnosis present

## 2014-07-19 DIAGNOSIS — Z833 Family history of diabetes mellitus: Secondary | ICD-10-CM | POA: Diagnosis not present

## 2014-07-19 DIAGNOSIS — I25110 Atherosclerotic heart disease of native coronary artery with unstable angina pectoris: Secondary | ICD-10-CM

## 2014-07-19 DIAGNOSIS — Z9989 Dependence on other enabling machines and devices: Secondary | ICD-10-CM

## 2014-07-19 HISTORY — DX: Obstructive sleep apnea (adult) (pediatric): G47.33

## 2014-07-19 HISTORY — DX: Type 2 diabetes mellitus without complications: E11.9

## 2014-07-19 HISTORY — DX: Cerebral infarction, unspecified: I63.9

## 2014-07-19 HISTORY — DX: Dependence on other enabling machines and devices: Z99.89

## 2014-07-19 LAB — BASIC METABOLIC PANEL
Anion Gap: 11 (ref 7–16)
BUN: 16 mg/dL (ref 7–18)
Calcium, Total: 8.9 mg/dL (ref 8.5–10.1)
Chloride: 100 mmol/L (ref 98–107)
Co2: 27 mmol/L (ref 21–32)
Creatinine: 1.02 mg/dL (ref 0.60–1.30)
EGFR (African American): >60
EGFR (Non-African Amer.): >60
Glucose: 106 mg/dL — ABNORMAL HIGH (ref 65–99)
Osmolality: 277 (ref 275–301)
Potassium: 4.1 mmol/L (ref 3.5–5.1)
Sodium: 138 mmol/L (ref 136–145)

## 2014-07-19 LAB — PROTIME-INR
INR: 1.1
Prothrombin Time: 13.6 secs (ref 11.5–14.7)

## 2014-07-19 LAB — CBC WITH DIFFERENTIAL/PLATELET
Basophil #: 0.1 10*3/uL (ref 0.0–0.1)
Basophil %: 0.6 %
Eosinophil #: 0.1 10*3/uL (ref 0.0–0.7)
Eosinophil %: 1.2 %
HCT: 40.7 % (ref 40.0–52.0)
HGB: 13.8 g/dL (ref 13.0–18.0)
Lymphocyte #: 1.4 10*3/uL (ref 1.0–3.6)
Lymphocyte %: 12.7 %
MCH: 28.8 pg (ref 26.0–34.0)
MCHC: 34 g/dL (ref 32.0–36.0)
MCV: 85 fL (ref 80–100)
Monocyte #: 0.7 x10 3/mm (ref 0.2–1.0)
Monocyte %: 6.2 %
Neutrophil #: 8.9 10*3/uL — ABNORMAL HIGH (ref 1.4–6.5)
Neutrophil %: 79.3 %
Platelet: 218 10*3/uL (ref 150–440)
RBC: 4.8 10*6/uL (ref 4.40–5.90)
RDW: 13.8 % (ref 11.5–14.5)
WBC: 11.3 10*3/uL — ABNORMAL HIGH (ref 3.8–10.6)

## 2014-07-19 LAB — GLUCOSE, CAPILLARY
Glucose-Capillary: 185 mg/dL — ABNORMAL HIGH (ref 70–99)
Glucose-Capillary: 267 mg/dL — ABNORMAL HIGH (ref 70–99)

## 2014-07-19 LAB — APTT: Activated PTT: 26.8 secs (ref 23.6–35.9)

## 2014-07-19 MED ORDER — NITROGLYCERIN 2 % TD OINT
0.5000 [in_us] | TOPICAL_OINTMENT | Freq: Four times a day (QID) | TRANSDERMAL | Status: DC
  Administered 2014-07-19 – 2014-07-23 (×16): 0.5 [in_us] via TOPICAL
  Filled 2014-07-19: qty 30

## 2014-07-19 MED ORDER — INSULIN GLARGINE 100 UNIT/ML ~~LOC~~ SOLN
80.0000 [IU] | Freq: Every day | SUBCUTANEOUS | Status: DC
  Administered 2014-07-19 – 2014-07-22 (×4): 80 [IU] via SUBCUTANEOUS
  Filled 2014-07-19 (×5): qty 0.8

## 2014-07-19 MED ORDER — ONDANSETRON HCL 4 MG/2ML IJ SOLN: 4.0000 mg | Freq: Four times a day (QID) | INTRAMUSCULAR | Status: DC | PRN

## 2014-07-19 MED ORDER — FUROSEMIDE 40 MG PO TABS
40.0000 mg | ORAL_TABLET | Freq: Every day | ORAL | Status: DC
  Administered 2014-07-19 – 2014-07-23 (×5): 40 mg via ORAL
  Filled 2014-07-19 (×5): qty 1

## 2014-07-19 MED ORDER — ROSUVASTATIN CALCIUM 5 MG PO TABS
5.0000 mg | ORAL_TABLET | Freq: Every day | ORAL | Status: DC
  Administered 2014-07-19 – 2014-07-23 (×5): 5 mg via ORAL
  Filled 2014-07-19 (×5): qty 1

## 2014-07-19 MED ORDER — POTASSIUM CHLORIDE CRYS ER 20 MEQ PO TBCR
20.0000 meq | EXTENDED_RELEASE_TABLET | Freq: Every day | ORAL | Status: DC
  Administered 2014-07-19 – 2014-07-23 (×5): 20 meq via ORAL
  Filled 2014-07-19 (×5): qty 1

## 2014-07-19 MED ORDER — ACETAMINOPHEN 325 MG PO TABS
650.0000 mg | ORAL_TABLET | ORAL | Status: DC | PRN
  Administered 2014-07-19 – 2014-07-20 (×4): 650 mg via ORAL
  Filled 2014-07-19 (×4): qty 2

## 2014-07-19 MED ORDER — SODIUM CHLORIDE 0.9 % IJ SOLN: 3.0000 mL | INTRAMUSCULAR | Status: DC | PRN

## 2014-07-19 MED ORDER — ASPIRIN EC 81 MG PO TBEC
81.0000 mg | DELAYED_RELEASE_TABLET | Freq: Every day | ORAL | Status: DC
  Administered 2014-07-20 – 2014-07-23 (×4): 81 mg via ORAL
  Filled 2014-07-19 (×4): qty 1

## 2014-07-19 MED ORDER — NITROGLYCERIN 0.4 MG SL SUBL: 0.4000 mg | SUBLINGUAL_TABLET | SUBLINGUAL | Status: DC | PRN

## 2014-07-19 MED ORDER — HEPARIN (PORCINE) IN NACL 100-0.45 UNIT/ML-% IJ SOLN
1900.0000 [IU]/h | INTRAMUSCULAR | Status: DC
  Administered 2014-07-19: 1250 [IU]/h via INTRAVENOUS
  Administered 2014-07-21 (×2): 1900 [IU]/h via INTRAVENOUS
  Filled 2014-07-19 (×9): qty 250

## 2014-07-19 MED ORDER — SODIUM CHLORIDE 0.9 % IV SOLN: 250.0000 mL | INTRAVENOUS | Status: DC | PRN

## 2014-07-19 MED ORDER — SODIUM CHLORIDE 0.9 % IJ SOLN
3.0000 mL | Freq: Two times a day (BID) | INTRAMUSCULAR | Status: DC
  Administered 2014-07-19 – 2014-07-23 (×5): 3 mL via INTRAVENOUS

## 2014-07-19 MED ORDER — LISINOPRIL 2.5 MG PO TABS
2.5000 mg | ORAL_TABLET | Freq: Every day | ORAL | Status: DC
  Administered 2014-07-19 – 2014-07-23 (×5): 2.5 mg via ORAL
  Filled 2014-07-19 (×5): qty 1

## 2014-07-19 MED ORDER — FERROUS SULFATE 325 (65 FE) MG PO TABS
325.0000 mg | ORAL_TABLET | Freq: Every day | ORAL | Status: DC
  Administered 2014-07-20 – 2014-07-23 (×4): 325 mg via ORAL
  Filled 2014-07-19 (×5): qty 1

## 2014-07-19 MED ORDER — METOPROLOL TARTRATE 12.5 MG HALF TABLET
12.5000 mg | ORAL_TABLET | Freq: Two times a day (BID) | ORAL | Status: DC
  Administered 2014-07-19 – 2014-07-23 (×7): 12.5 mg via ORAL
  Filled 2014-07-19 (×9): qty 1

## 2014-07-19 MED ORDER — INSULIN ASPART 100 UNIT/ML ~~LOC~~ SOLN
0.0000 [IU] | Freq: Three times a day (TID) | SUBCUTANEOUS | Status: DC
  Administered 2014-07-19: 3 [IU] via SUBCUTANEOUS
  Administered 2014-07-20: 5 [IU] via SUBCUTANEOUS
  Administered 2014-07-20: 3 [IU] via SUBCUTANEOUS
  Administered 2014-07-20: 11 [IU] via SUBCUTANEOUS
  Administered 2014-07-21: 5 [IU] via SUBCUTANEOUS
  Administered 2014-07-21: 3 [IU] via SUBCUTANEOUS
  Administered 2014-07-21: 15 [IU] via SUBCUTANEOUS
  Administered 2014-07-22 – 2014-07-23 (×2): 3 [IU] via SUBCUTANEOUS

## 2014-07-19 MED ORDER — BICALUTAMIDE 50 MG PO TABS
50.0000 mg | ORAL_TABLET | Freq: Every day | ORAL | Status: DC
  Administered 2014-07-19 – 2014-07-23 (×5): 50 mg via ORAL
  Filled 2014-07-19 (×5): qty 1

## 2014-07-19 NOTE — Progress Notes (Signed)
ANTICOAGULATION CONSULT NOTE - Initial Consult  Pharmacy Consult for heparin Indication: chest pain/ACS  Allergies  Allergen Reactions  . Metformin And Related Nausea Only  . Statins     REACTION: JOINT ACHES    Patient Measurements:   Heparin Dosing Weight: 96.15 kg  Vital Signs:    Labs: No results for input(s): HGB, HCT, PLT, APTT, LABPROT, INR, HEPARINUNFRC, CREATININE, CKTOTAL, CKMB, TROPONINI in the last 72 hours.  CrCl cannot be calculated (Unknown ideal weight.).   Medical History: Past Medical History  Diagnosis Date  . Diabetes mellitus 2000    Type II  . Hypertension 2000  . Environmental allergies   . Pneumonia     x3 as an adult-  last time 2009  . Arthritis   . History of chicken pox   . Syncope and collapse 04/11/14  . Stroke     H/o pontine CVA, prev eval by Dr. Erling Cruz  . Shortness of breath     sometimes, with exertion  . 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 Coronary artery disease September of 2015    New onset angina. Cardiac catheterization showed severe three-vessel and moderate left main disease. Ejection fraction was 45%. He [facility] with LIMA to LAD, SVG to DIAGONAL, SVG to OM1, SVG to PDA  . Hyperlipidemia     Medications:  Scheduled:  . [START ON 07/20/2014] aspirin EC  81 mg Oral Daily  . bicalutamide  50 mg Oral Daily  . [START ON 07/20/2014] ferrous sulfate  325 mg Oral Q breakfast  . furosemide  40 mg Oral Daily  . insulin aspart  0-15 Units Subcutaneous TID WC  . insulin glargine  80 Units Subcutaneous QHS  . lisinopril  2.5 mg Oral Daily  . metoprolol tartrate  12.5 mg Oral BID  . nitroGLYCERIN  0.5 inch Topical 4 times per day  . potassium chloride  20 mEq Oral Daily  . rosuvastatin  5 mg Oral Daily  . sodium chloride  3 mL Intravenous Q12H   Infusions:    Assessment: 63 yo male with ACS s/p cath by Dr. Fletcher Anon will be started on heparin therapy at 2100 which will be 8 hours post sheath removal.  Per  patient, he weighs ~ 252 lbs. Previous baseline labs good.  Patient was not on any anticoagulation prior to admission.  Goal of Therapy:  Heparin level 0.3-0.7 units/ml Monitor platelets by anticoagulation protocol: Yes   Plan:  - Start heparin drip at 1250 units/hr at 2100 tonight. No bolus - 6 hours heparin level - daily heparin level and CBC  Aashritha Miedema, Tsz-Yin 07/19/2014,4:23 PM

## 2014-07-19 NOTE — Progress Notes (Signed)
Patient ID: Phillip Mimes Sr. MRN: [id number], DOB/AGE: 63/13/1952   Admit date: (Not on file)   Primary Physician: Elsie Stain, MD Primary Cardiologist: Jerilynn Mages. Fletcher Anon, MD   Pt. Profile:  63 y/o male with recent CABG who presented for stress test today and was found to have ST elevation.  Problem List  Past Medical History  Diagnosis Date  . Diabetes mellitus 2000    Type II  . Hypertension 2000  . Environmental allergies   . Pneumonia     x3 as an adult-  last time 2009  . Arthritis   . History of chicken pox   . Syncope and collapse 04/11/14  . Stroke     H/o pontine CVA, prev eval by Dr. Erling Cruz  . Shortness of breath     sometimes, with exertion  . 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 Coronary artery disease September of 2015    New onset angina. Cardiac catheterization showed severe three-vessel and moderate left main disease. Ejection fraction was 45%. [facility] with LIMA to LAD, SVG to DIAGONAL, SVG to OM1, SVG to PDA  . Hyperlipidemia     Past Surgical History  Procedure Laterality Date  . Prostate biopsy  04/03/06 & 02/25/07    x2  . High intense focused ultrasound  07/20/06    By Dr. Jacqlyn Larsen  . Cytoscopy prostatic stone o/w nml  06/21/08    Dr. Jacqlyn Larsen  . Ett myoview  09/13/09    Low risk EF 49%  . Anterior cervical decomp/discectomy fusion  08/25/2012    Procedure: ANTERIOR CERVICAL DECOMPRESSION/DISCECTOMY FUSION 2 LEVELS;  Surgeon: Hosie Spangle, MD;  Location: [place] [facility];  Service: Neurosurgery;  Laterality: N/A;  Cervical five-six Cervical six-seven anterior cervical decompression with fusion and plating and bonegraft  . Intraoperative transesophageal echocardiogram N/A 04/26/2014    Procedure: INTRAOPERATIVE TRANSESOPHAGEAL ECHOCARDIOGRAM;  Surgeon: Ivin Poot, MD;  Location: [place];  Service: Open Heart Surgery;  Laterality: N/A;  . Coronary artery bypass graft N/A 04/26/2014    Procedure: CORONARY ARTERY BYPASS GRAFTING (CABG), on  pump, times four, using left internal mammary artery, right greater saphenous vein harvested endoscopically.;  Surgeon: Ivin Poot, MD;  Location: [place];  Service: Open Heart Surgery;  Laterality: N/A;  LIMA to LAD, SVG to DIAGONAL, SVG to OM1, SVG to PDA) with EVH of the RIGHT THIGH and LOWER EXTREMITY SAPHENOUS VEIN  . Cardiac catheterization       Allergies  Allergies  Allergen Reactions  . Metformin And Related Nausea Only  . Statins     REACTION: JOINT ACHES    HPI   63 y/o male with the above complex problem list.  He is recently s/p CABG in 04/2014.  He had been doing well @ cardiac rehab but recently began to not exertional fatigue, dyspnea, and left arm pain.  Through numerous phone conversations with our office, decision was made to pursue stress testing.  This was performed today.  During exercise he developed 2-3 mm inferior ST elevation and 3-5 mm ST elevation in leads V3-V5.  He denied chest or arm pain.  He did have dyspnea and lower extremity claudication during exercise.  ST changes resolved by 6 mins in recovery.  At no point did he have c/p or arm pain.  ECG's reviewed by Dr. Rockey Situ and discussed with Dr. Fletcher Anon.  Admit today for cath.  Home Medications  Prior to Admission medications  Medication Sig Start Date End Date Taking? Authorizing Provider  aspirin EC 325 MG EC tablet Take 1 tablet (325 mg total) by mouth daily. 05/03/14   Donielle Liston Alba, PA-C  bicalutamide (CASODEX) 50 MG tablet Take 50 mg by mouth daily.    Historical Provider, MD  budesonide-formoterol (SYMBICORT) 160-4.5 MCG/ACT inhaler Inhale 2 puffs into the lungs 2 (two) times daily. 05/03/14   Donielle Liston Alba, PA-C  ferrous sulfate 325 (65 FE) MG tablet Take 1 tablet (325 mg total) by mouth daily with breakfast. For one month then stop. 05/03/14   Donielle Liston Alba, PA-C  furosemide (LASIX) 40 MG tablet Take 1 tablet (40 mg total) by mouth daily. 07/12/14   Wellington Hampshire, MD  glucose  blood test strip by Other route. Use daily as ordered. Code: 250.00     Historical Provider, MD  insulin glargine (LANTUS) 100 UNIT/ML injection Inject 80 Units into the skin daily. Disp 15 pens 09/22/13   Tonia Ghent, MD  Insulin Pen Needle 31G X 8 MM MISC Use as directed to inject blood sugar twice daily dx:250.00 08/11/13   Tonia Ghent, MD  leuprolide (LUPRON) 30 MG injection Inject 45 mg into the muscle every 6 (six) months.    Historical Provider, MD  lisinopril (ZESTRIL) 2.5 MG tablet Take 1 tablet (2.5 mg total) by mouth daily. 09/16/13   Tonia Ghent, MD  metoprolol tartrate (LOPRESSOR) 25 MG tablet Take 0.5 tablets (12.5 mg total) by mouth 2 (two) times daily. 07/15/14   Wellington Hampshire, MD  oxyCODONE (OXY IR/ROXICODONE) 5 MG immediate release tablet Take 1-2 tablets (5-10 mg total) by mouth every 4 (four) hours as needed for severe pain. 05/03/14   Donielle Liston Alba, PA-C  potassium chloride SA (K-DUR,KLOR-CON) 20 MEQ tablet Take 1 tablet (20 mEq total) by mouth daily. 07/12/14   Wellington Hampshire, MD  rosuvastatin (CRESTOR) 5 MG tablet Take 1 tablet (5 mg total) by mouth daily. 05/20/14   Wellington Hampshire, MD  sitaGLIPtin (JANUVIA) 50 MG tablet Take 1 tablet (50 mg total) by mouth daily. 03/30/14   Tonia Ghent, MD    Family History  Family History  Problem Relation Age of Onset  . Diabetes Mother 64    DM  . Coronary artery disease Mother   . Hypertension Mother   . Heart attack Mother     CABG with MI in 2005  . Heart disease Mother     CV  . Hypertension Father   . Heart attack Father     CABG with Mi around 1997  . Coronary artery disease Father   . Heart disease Father     CV  . Heart attack Brother     MI/PTCA  . Heart disease Brother     CV  . Heart attack Maternal Grandfather     MI  . Prostate cancer Paternal Grandfather   . Breast cancer Neg Hx     Breast/ovarian/uterine cancer  . Colon cancer Neg Hx   . Depression Neg Hx   . Alcohol abuse Neg  Hx     ETOH/drug abuse  . Stroke Neg Hx   . Diabetes Sister     DM    Social History  History   Social History  . Marital Status: Married    Spouse Name: N/A    Number of Children: 3  . Years of Education: N/A   Occupational History  . 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 Smoking status: Former Smoker -- 1.00 packs/day for 36 years  . Smokeless tobacco: Former Systems developer    Quit date: 03/01/2014     Comment: quit 03/2014  . Alcohol Use: Yes     Comment: occassionally  . Drug Use: No  . Sexual Activity: Not on file   Other Topics Concern  . Not on file   Social History Narrative   Lives with wife.   2 daughters and 1 son.   [employer] fan   Runs [employer]     Review of Systems General:  No chills, fever, night sweats or weight changes.  Cardiovascular:  +++ chest and left arm pain, +++ dyspnea on exertion, +++ bilat le claudication, no edema, orthopnea, palpitations, paroxysmal nocturnal dyspnea. Dermatological: No rash, lesions/masses Respiratory: No cough, dyspnea Urologic: No hematuria, dysuria Abdominal:   No nausea, vomiting, diarrhea, bright red blood per rectum, melena, or hematemesis Neurologic:  No visual changes, wkns, changes in mental status. All other systems reviewed and are otherwise negative except as noted above.  Physical Exam  There were no vitals taken for this visit.  General: Pleasant, NAD Psych: Normal affect. Neuro: Alert and oriented X 3. Moves all extremities spontaneously. HEENT: Normal  Neck: Supple without bruits or JVD. Lungs:  Resp regular and unlabored, CTA. Heart: RRR no s3, s4, or murmurs. Abdomen: Soft, non-tender, non-distended, BS + x 4.  Extremities: No clubbing, cyanosis or edema. DP/PT/Radials 2+ and equal bilaterally.  Labs  pending   Radiology/Studies  None  ECG  During exercise and early recovery - sinus with 2-3 mm inf st elevation and 3-5 mm ST elev in V3-V5 with  mild ST dep in I and aVL.  ST changes resolved in recovery.  ASSESSMENT AND PLAN  1.  [place] with evidence of ST elevation on exercise:  As above, pt developed inflat ST elev with exercise during stress test today.  He c/o dyspnea but denied chest or left arm pain (anginal equivalent).  Dyspnea and ST changes resolved by 6 mins in recovery.  Hemodynamically stable throughout.  Reviewed with Drs. Gollan and Arida.  Plan for admission, heparinization, and catheterization this afternoon.  2.  HTN:  Cont home meds.  Follow.  3.  HL:  Cont statin.  4.  DM:  Cont januvia/lantus.  Add SSI.  5.  Morbid Obesity:  He has been partaking in cardiac rehab.  Resume pending cath.  Signed, Murray Hodgkins, NP 07/19/2014, 9:56 AM

## 2014-07-19 NOTE — H&P (Signed)
Rogelia Mire, NP Nurse Practitioner Signed  Progress Notes 07/19/2014 9:56 AM  Related encounter: Orders Only from 07/19/2014 in [place] All Collapse All      Patient ID: TINY RIETZ Sr. MRN: [id number], DOB/AGE: 1950/10/16   Admit date: (Not on file)   Primary Physician: Elsie Stain, MD Primary Cardiologist: Jerilynn Mages. Fletcher Anon, MD   Pt. Profile:  63 y/o male with recent CABG who presented for stress test today and was found to have ST elevation.  Problem List  Past Medical History  Diagnosis Date  . Diabetes mellitus 2000    Type II  . Hypertension 2000  . Environmental allergies   . Pneumonia     x3 as an adult- last time 2009  . Arthritis   . History of chicken pox   . Syncope and collapse 04/11/14  . Stroke     H/o pontine CVA, prev eval by Dr. Erling Cruz  . Shortness of breath     sometimes, with exertion  . 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 Coronary artery disease September of 2015    New onset angina. Cardiac catheterization showed severe three-vessel and moderate left main disease. Ejection fraction was 45%. [facility] with LIMA to LAD, SVG to DIAGONAL, SVG to OM1, SVG to PDA  . Hyperlipidemia     Past Surgical History  Procedure Laterality Date  . Prostate biopsy  04/03/06 & 02/25/07    x2  . High intense focused ultrasound  07/20/06    By Dr. Jacqlyn Larsen  . Cytoscopy prostatic stone o/w nml  06/21/08    Dr. Jacqlyn Larsen  . Ett myoview  09/13/09    Low risk EF 49%  . Anterior cervical decomp/discectomy fusion  08/25/2012    Procedure: ANTERIOR CERVICAL DECOMPRESSION/DISCECTOMY FUSION 2 LEVELS; Surgeon: Hosie Spangle, MD; Location: [place] [facility]; Service: Neurosurgery; Laterality: N/A; Cervical five-six Cervical six-seven anterior cervical decompression with fusion and plating and bonegraft  . Intraoperative  transesophageal echocardiogram N/A 04/26/2014    Procedure: INTRAOPERATIVE TRANSESOPHAGEAL ECHOCARDIOGRAM; Surgeon: Ivin Poot, MD; Location: [place]; Service: Open Heart Surgery; Laterality: N/A;  . Coronary artery bypass graft N/A 04/26/2014    Procedure: CORONARY ARTERY BYPASS GRAFTING (CABG), on pump, times four, using left internal mammary artery, right greater saphenous vein harvested endoscopically.; Surgeon: Ivin Poot, MD; Location: [place]; Service: Open Heart Surgery; Laterality: N/A; LIMA to LAD, SVG to DIAGONAL, SVG to OM1, SVG to PDA) with EVH of the RIGHT THIGH and LOWER EXTREMITY SAPHENOUS VEIN  . Cardiac catheterization       Allergies  Allergies  Allergen Reactions  . Metformin And Related Nausea Only  . Statins     REACTION: JOINT ACHES    HPI   63 y/o male with the above complex problem list. He is recently s/p CABG in 04/2014. He had been doing well @ cardiac rehab but recently began to not exertional fatigue, dyspnea, and left arm pain. Through numerous phone conversations with our office, decision was made to pursue stress testing. This was performed today. During exercise he developed 2-3 mm inferior ST elevation and 3-5 mm ST elevation in leads V3-V5. He denied chest or arm pain. He did have dyspnea and lower extremity claudication during exercise. ST changes resolved by 6 mins in recovery. At no point did he have c/p or arm pain. ECG's reviewed by Dr. Rockey Situ and discussed with Dr. Fletcher Anon. Admit today  for cath.  Home Medications  Prior to Admission medications   Medication Sig Start Date End Date Taking? Authorizing Provider  aspirin EC 325 MG EC tablet Take 1 tablet (325 mg total) by mouth daily. 05/03/14   Donielle Liston Alba, PA-C  bicalutamide (CASODEX) 50 MG tablet Take 50 mg by mouth daily.    Historical Provider, MD  budesonide-formoterol (SYMBICORT) 160-4.5 MCG/ACT inhaler Inhale 2  puffs into the lungs 2 (two) times daily. 05/03/14   Donielle Liston Alba, PA-C  ferrous sulfate 325 (65 FE) MG tablet Take 1 tablet (325 mg total) by mouth daily with breakfast. For one month then stop. 05/03/14   Donielle Liston Alba, PA-C  furosemide (LASIX) 40 MG tablet Take 1 tablet (40 mg total) by mouth daily. 07/12/14   Wellington Hampshire, MD  glucose blood test strip by Other route. Use daily as ordered. Code: 250.00     Historical Provider, MD  insulin glargine (LANTUS) 100 UNIT/ML injection Inject 80 Units into the skin daily. Disp 15 pens 09/22/13   Tonia Ghent, MD  Insulin Pen Needle 31G X 8 MM MISC Use as directed to inject blood sugar twice daily dx:250.00 08/11/13   Tonia Ghent, MD  leuprolide (LUPRON) 30 MG injection Inject 45 mg into the muscle every 6 (six) months.    Historical Provider, MD  lisinopril (ZESTRIL) 2.5 MG tablet Take 1 tablet (2.5 mg total) by mouth daily. 09/16/13   Tonia Ghent, MD  metoprolol tartrate (LOPRESSOR) 25 MG tablet Take 0.5 tablets (12.5 mg total) by mouth 2 (two) times daily. 07/15/14   Wellington Hampshire, MD  oxyCODONE (OXY IR/ROXICODONE) 5 MG immediate release tablet Take 1-2 tablets (5-10 mg total) by mouth every 4 (four) hours as needed for severe pain. 05/03/14   Donielle Liston Alba, PA-C  potassium chloride SA (K-DUR,KLOR-CON) 20 MEQ tablet Take 1 tablet (20 mEq total) by mouth daily. 07/12/14   Wellington Hampshire, MD  rosuvastatin (CRESTOR) 5 MG tablet Take 1 tablet (5 mg total) by mouth daily. 05/20/14   Wellington Hampshire, MD  sitaGLIPtin (JANUVIA) 50 MG tablet Take 1 tablet (50 mg total) by mouth daily. 03/30/14   Tonia Ghent, MD    Family History  Family History  Problem Relation Age of Onset  . Diabetes Mother 4    DM  . Coronary artery disease Mother   . Hypertension Mother   . Heart attack Mother     CABG with MI in 2005  .  Heart disease Mother     CV  . Hypertension Father   . Heart attack Father     CABG with Mi around 1997  . Coronary artery disease Father   . Heart disease Father     CV  . Heart attack Brother     MI/PTCA  . Heart disease Brother     CV  . Heart attack Maternal Grandfather     MI  . Prostate cancer Paternal Grandfather   . Breast cancer Neg Hx     Breast/ovarian/uterine cancer  . Colon cancer Neg Hx   . Depression Neg Hx   . Alcohol abuse Neg Hx     ETOH/drug abuse  . Stroke Neg Hx   . Diabetes Sister     DM    Social History  History   Social History  . Marital Status: Married    Spouse Name: N/A    Number of Children: 3  . Years of  Education: N/A   Occupational History  . Insurance account manager    Social History Main Topics  . Smoking status: Former Smoker -- 1.00 packs/day for 36 years  . Smokeless tobacco: Former Systems developer    Quit date: 03/01/2014     Comment: quit 03/2014  . Alcohol Use: Yes     Comment: occassionally  . Drug Use: No  . Sexual Activity: Not on file   Other Topics Concern  . Not on file   Social History Narrative   Lives with wife.   2 daughters and 1 son.   [employer] fan   Runs [employer]     Review of Systems General: No chills, fever, night sweats or weight changes.  Cardiovascular: +++ chest and left arm pain, +++ dyspnea on exertion, +++ bilat le claudication, no edema, orthopnea, palpitations, paroxysmal nocturnal dyspnea. Dermatological: No rash, lesions/masses Respiratory: No cough, dyspnea Urologic: No hematuria, dysuria Abdominal: No nausea, vomiting, diarrhea, bright red blood per rectum, melena, or hematemesis Neurologic: No visual changes, wkns, changes in mental status. All other systems reviewed and are otherwise negative except as  noted above.  Physical Exam  There were no vitals taken for this visit.  General: Pleasant, NAD Psych: Normal affect. Neuro: Alert and oriented X 3. Moves all extremities spontaneously. HEENT: Normal Neck: Supple without bruits or JVD. Lungs: Resp regular and unlabored, CTA. Heart: RRR no s3, s4, or murmurs. Abdomen: Soft, non-tender, non-distended, BS + x 4.  Extremities: No clubbing, cyanosis or edema. DP/PT/Radials 2+ and equal bilaterally.  Labs  pending  Radiology/Studies  None  ECG  During exercise and early recovery - sinus with 2-3 mm inf st elevation and 3-5 mm ST elev in V3-V5 with mild ST dep in I and aVL. ST changes resolved in recovery.  ASSESSMENT AND PLAN  1. [place] with evidence of ST elevation on exercise: As above, pt developed inflat ST elev with exercise during stress test today. He c/o dyspnea but denied chest or left arm pain (anginal equivalent). Dyspnea and ST changes resolved by 6 mins in recovery. Hemodynamically stable throughout. Reviewed with Drs. Gollan and Arida. Pt just underwent cath by Dr. Fletcher Anon revealing severe native vessel and graft disease.  Remains pain free.  Plan to tx to [facility] for admission, heparinization, and CT surgery re-evaluation.  Cont asa, bb, statin.  2. HTN: Cont home meds. Follow.  3. HL: Cont statin.  4. DM: Cont lantus. Add SSI.  5. Morbid Obesity: He has been partaking in cardiac rehab. Resume pending further w/u @ [facility].  Signed, Murray Hodgkins, NP 07/19/2014, 1:30 PM

## 2014-07-20 DIAGNOSIS — I25700 Atherosclerosis of coronary artery bypass graft(s), unspecified, with unstable angina pectoris: Secondary | ICD-10-CM

## 2014-07-20 DIAGNOSIS — I249 Acute ischemic heart disease, unspecified: Secondary | ICD-10-CM

## 2014-07-20 LAB — CBC
HCT: 37.5 % — ABNORMAL LOW (ref 39.0–52.0)
Hemoglobin: 12.9 g/dL — ABNORMAL LOW (ref 13.0–17.0)
MCH: 28.5 pg (ref 26.0–34.0)
MCHC: 34.4 g/dL (ref 30.0–36.0)
MCV: 82.8 fL (ref 78.0–100.0)
Platelets: 195 10*3/uL (ref 150–400)
RBC: 4.53 MIL/uL (ref 4.22–5.81)
RDW: 13 % (ref 11.5–15.5)
WBC: 7.5 10*3/uL (ref 4.0–10.5)

## 2014-07-20 LAB — COMPREHENSIVE METABOLIC PANEL
ALT: 12 U/L (ref 0–53)
AST: 11 U/L (ref 0–37)
Albumin: 3.5 g/dL (ref 3.5–5.2)
Alkaline Phosphatase: 78 U/L (ref 39–117)
Anion gap: 16 — ABNORMAL HIGH (ref 5–15)
BUN: 16 mg/dL (ref 6–23)
CO2: 23 mEq/L (ref 19–32)
Calcium: 9.2 mg/dL (ref 8.4–10.5)
Chloride: 100 mEq/L (ref 96–112)
Creatinine, Ser: 0.83 mg/dL (ref 0.50–1.35)
GFR calc Af Amer: >90 mL/min (ref >90)
GFR calc non Af Amer: >90 mL/min (ref >90)
Glucose, Bld: 124 mg/dL — ABNORMAL HIGH (ref 70–99)
Potassium: 4 mEq/L (ref 3.7–5.3)
Sodium: 139 mEq/L (ref 137–147)
Total Bilirubin: 0.7 mg/dL (ref 0.3–1.2)
Total Protein: 6.5 g/dL (ref 6.0–8.3)

## 2014-07-20 LAB — GLUCOSE, CAPILLARY
Glucose-Capillary: 167 mg/dL — ABNORMAL HIGH (ref 70–99)
Glucose-Capillary: 238 mg/dL — ABNORMAL HIGH (ref 70–99)
Glucose-Capillary: 339 mg/dL — ABNORMAL HIGH (ref 70–99)
Glucose-Capillary: 202 mg/dL — ABNORMAL HIGH (ref 70–99)

## 2014-07-20 LAB — HEPARIN LEVEL (UNFRACTIONATED)
Heparin Unfractionated: <0.1 IU/mL — ABNORMAL LOW (ref 0.30–0.70)
Heparin Unfractionated: 0.24 IU/mL — ABNORMAL LOW (ref 0.30–0.70)
Heparin Unfractionated: 0.41 IU/mL (ref 0.30–0.70)

## 2014-07-20 LAB — LIPID PANEL
Cholesterol: 170 mg/dL (ref 0–200)
HDL: 32 mg/dL — ABNORMAL LOW (ref >39)
LDL Cholesterol: 116 mg/dL — ABNORMAL HIGH (ref 0–99)
Total CHOL/HDL Ratio: 5.3 RATIO
Triglycerides: 108 mg/dL (ref <150)
VLDL: 22 mg/dL (ref 0–40)

## 2014-07-20 LAB — MRSA PCR SCREENING: MRSA by PCR: NEGATIVE

## 2014-07-20 NOTE — Plan of Care (Signed)
Problem: Phase I Progression Outcomes Goal: Distal pulses equal to baseline Outcome: Completed/Met Date Met:  07/20/14

## 2014-07-20 NOTE — Progress Notes (Signed)
Patient Name: Phillip BURNHAM Sr. Date of Encounter: 07/20/2014  Active Problems:   Acute coronary syndrome   Primary Cardiologist: Dr. Fletcher Anon  Patient Profile: 63 year old male with a history of CABG in September 2015 developed possible anginal symptoms at cardiac rehabilitation, developed ST elevation during outpatient treadmill, cath showed 50% left main with significant native three-vessel disease SVG-OM 1 patent, significant disease proximally, all other grafts occluded. Patient transferred to [facility] for consideration of redo bypass  SUBJECTIVE: Note shortness of breath, never has chest pain. He gets left arm pain with exertion, this is likely his anginal equivalent. No resting symptoms, gets anginal symptoms with exertion after walking about 15 minutes.  OBJECTIVE Filed Vitals:   07/19/14 1500 07/19/14 1627 07/19/14 2024 07/20/14 0528  BP:  119/66 96/64 115/67  Pulse:  86 81 78  Temp:  98.4 F (36.9 C) 98.3 F (36.8 C) 97.9 F (36.6 C)  TempSrc:  Oral Oral Oral  Resp:  16 18 18   Height:    5\' 9"  (1.753 m)  Weight: 252 lb (114.306 kg)   253 lb 1.6 oz (114.805 kg)  SpO2:  98% 100% 100%    Intake/Output Summary (Last 24 hours) at 07/20/14 1059 Last data filed at 07/20/14 0420  Gross per 24 hour  Intake      0 ml  Output    700 ml  Net   -700 ml   Filed Weights   07/19/14 1500 07/20/14 0528  Weight: 252 lb (114.306 kg) 253 lb 1.6 oz (114.805 kg)    PHYSICAL EXAM General: Well developed, well nourished, male in no acute distress. Head: Normocephalic, atraumatic.  Neck: Supple without bruits, JVD not elevated. Lungs:  Resp regular and unlabored, CTA. Heart: RRR, S1, S2, no S3, S4, or murmur; no rub. Abdomen: Soft, non-tender, non-distended, BS + x 4.  Extremities: No clubbing, cyanosis, no edema.  Neuro: Alert and oriented X 3. Moves all extremities spontaneously. Psych: Normal affect.  LABS: CBC: Recent Labs  07/20/14 0241  WBC 7.5  HGB 12.9*  HCT 37.5*   MCV 82.8  PLT 008   Basic Metabolic Panel: Recent Labs  07/20/14 0241  NA 139  K 4.0  CL 100  CO2 23  GLUCOSE 124*  BUN 16  CREATININE 0.83  CALCIUM 9.2   Liver Function Tests: Recent Labs  07/20/14 0241  AST 11  ALT 12  ALKPHOS 78  BILITOT 0.7  PROT 6.5  ALBUMIN 3.5   Fasting Lipid Panel: Recent Labs  07/20/14 0241  CHOL 170  HDL 32*  LDLCALC 116*  TRIG 108  CHOLHDL 5.3    TELE: Sinus rhythm       Radiology/Studies: No results found.   Current Medications:  . aspirin EC  81 mg Oral Daily  . bicalutamide  50 mg Oral Daily  . ferrous sulfate  325 mg Oral Q breakfast  . furosemide  40 mg Oral Daily  . insulin aspart  0-15 Units Subcutaneous TID WC  . insulin glargine  80 Units Subcutaneous QHS  . lisinopril  2.5 mg Oral Daily  . metoprolol tartrate  12.5 mg Oral BID  . nitroGLYCERIN  0.5 inch Topical 4 times per day  . potassium chloride  20 mEq Oral Daily  . rosuvastatin  5 mg Oral Daily  . sodium chloride  3 mL Intravenous Q12H   . heparin 1,700 Units/hr (07/20/14 0419)    ASSESSMENT AND PLAN: Active Problems:   Acute coronary syndrome -  surgeons are aware of the need to evaluate the patient. Currently pain-free. Patient and his wife had multiple questions and concerns regarding his condition. Answered several questions, but advised them that other questions could not be answered until the surgeons had seen in the final plan of care was made. Encouraged them to continue documenting their questions and make sure they got answered.    MRSA - patient was on contact precautions during his previous admission, will look into retesting him and see if there is any way to make sure contract precautions are truly appropriate.    Otherwise, continue current medical therapy. Emphasized cardiac risk factor reduction and diabetes control.   Signed, Rosaria Ferries , PA-C 10:59 AM 07/20/2014  Agree with note by Rosaria Ferries PA-C  Pt admitted in transfer  from [facility] after high risk GXT with ST elevation. H/O recent CABG in Sept (X4). Recent cath revealed 3/4 grafts gone with severe native vessel disease. He has signif effort angina with markedly + GXT. Reason for early graft failure not clear. TCTS was re consulted to evaluate for re do CABG. Currently pain free on IV hep. Exam benign. I have not reviewed cath films from [facility] yet but I will.  Lorretta Harp, M.D., [place], [facility], [place] [address]. [place], [place]  [address]  [phone] 07/20/2014 2:13 PM

## 2014-07-20 NOTE — Progress Notes (Signed)
ANTICOAGULATION CONSULT NOTE - Follow Up Consult  Pharmacy Consult for heparin Indication: CAD and USAP   Labs:  Recent Labs  07/20/14 0241  HGB 12.9*  HCT 37.5*  PLT 195  HEPARINUNFRC <0.10*     Assessment: 63yo male undetectable on heparin with initial dosing while awaiting CVTS consult.  Goal of Therapy:  Heparin level 0.3-0.7 units/ml   Plan:  Will increase heparin gtt by 4 units/kg/hr to 1700 units/hr and check level in 6h.  Wynona Neat, PharmD, BCPS  07/20/2014,4:01 AM

## 2014-07-20 NOTE — Plan of Care (Signed)
Problem: Phase I Progression Outcomes Goal: Hemodynamically stable Outcome: Completed/Met Date Met:  07/20/14     

## 2014-07-20 NOTE — Progress Notes (Signed)
ANTICOAGULATION CONSULT NOTE - Initial Consult  Pharmacy Consult for heparin Indication: CAD and USAP  Allergies  Allergen Reactions  . Metformin And Related Nausea Only  . Statins     REACTION: JOINT ACHES    Patient Measurements: Height: 5\' 9"  (175.3 cm) Weight: 253 lb 1.6 oz (114.805 kg) IBW/kg (Calculated) : 70.7 Heparin Dosing Weight: 96.15 kg  Vital Signs: Temp: 97.6 F (36.4 C) (12/08 1300) Temp Source: Oral (12/08 1300) BP: 102/53 mmHg (12/08 1300) Pulse Rate: 70 (12/08 1300)  Labs:  Recent Labs  07/20/14 0241 07/20/14 1255 07/20/14 1955  HGB 12.9*  --   --   HCT 37.5*  --   --   PLT 195  --   --   HEPARINUNFRC <0.10* 0.24* 0.41  CREATININE 0.83  --   --     Estimated Creatinine Clearance: 113.8 mL/min (by C-G formula based on Cr of 0.83).   Medical History: Past Medical History  Diagnosis Date  . Hypertension 2000  . Environmental allergies   . History of chicken pox   . Syncope and collapse 04/11/14  . Shortness of breath     sometimes, with exertion  . Coronary artery disease September of 2015    New onset angina. Cardiac catheterization showed severe three-vessel and moderate left main disease. Ejection fraction was 45%. [facility] with LIMA to LAD, SVG to DIAGONAL, SVG to OM1, SVG to PDA  . Hyperlipidemia   . Pneumonia     x3 as an adult-  last time 2009 (07/19/2014)  . OSA on CPAP   . Type II diabetes mellitus 2000  . Stroke     H/o pontine CVA, prev eval by Dr. Erling Cruz  . Arthritis     "probably in my hands" (07/19/2014)  . Prostate cancer     metastatic to neck    Medications:  Scheduled:  . aspirin EC  81 mg Oral Daily  . bicalutamide  50 mg Oral Daily  . ferrous sulfate  325 mg Oral Q breakfast  . furosemide  40 mg Oral Daily  . insulin aspart  0-15 Units Subcutaneous TID WC  . insulin glargine  80 Units Subcutaneous QHS  . lisinopril  2.5 mg Oral Daily  . metoprolol tartrate  12.5 mg Oral BID  . nitroGLYCERIN  0.5  inch Topical 4 times per day  . potassium chloride  20 mEq Oral Daily  . rosuvastatin  5 mg Oral Daily  . sodium chloride  3 mL Intravenous Q12H   Infusions:  . heparin 1,900 Units/hr (07/20/14 1356)    Assessment: 63 yo male with ACS s/p cath by Dr. Fletcher Anon will be started on heparin therapy at 2100 which will be 8 hours post sheath removal.  Per patient, he weighs ~ 252 lbs. Previous baseline labs good.  Patient was not on any anticoagulation prior to admission.  HL is therapeutic at 0.41 on heparin 1900 units/hr.   Goal of Therapy:  Heparin level 0.3-0.7 units/ml Monitor platelets by anticoagulation protocol: Yes   Plan:  Continue heparin 1900 units/hr Daily HL/CBC Monitor for s/sx of bleeding  Andrey Cota. Diona Foley, PharmD Clinical Pharmacist Pager [phone] 07/20/2014,9:01 PM

## 2014-07-20 NOTE — Progress Notes (Addendum)
ANTICOAGULATION CONSULT NOTE - Follow Up Consult  Pharmacy Consult for heparin Indication: CAD and USAP   Labs:  Recent Labs  07/20/14 0241 07/20/14 1255  HGB 12.9*  --   HCT 37.5*  --   PLT 195  --   HEPARINUNFRC <0.10* 0.24*  CREATININE 0.83  --      Assessment: 63yo male on heparin for ACS.  Heparin level 0.24 units/ml.  No bleeding complications noted  Goal of Therapy:  Heparin level 0.3-0.7 units/ml   Plan:  Will increase heparin drip to 1900 units/hr Check heparin level 6 hours after rate change  Thanks for allowing pharmacy to be a part of this patient's care.  Excell Seltzer, PharmD Clinical Pharmacist, [phone] 07/20/2014,1:35 PM

## 2014-07-20 NOTE — Plan of Care (Signed)
Problem: Phase I Progression Outcomes Goal: Pain controlled with appropriate interventions Outcome: Completed/Met Date Met:  07/20/14 Goal: Initial discharge plan identified Outcome: Completed/Met Date Met:  07/20/14

## 2014-07-20 NOTE — Progress Notes (Signed)
UR Completed.  [phone]  

## 2014-07-21 DIAGNOSIS — I2510 Atherosclerotic heart disease of native coronary artery without angina pectoris: Secondary | ICD-10-CM

## 2014-07-21 LAB — CBC
HCT: 37.1 % — ABNORMAL LOW (ref 39.0–52.0)
Hemoglobin: 12.8 g/dL — ABNORMAL LOW (ref 13.0–17.0)
MCH: 28.6 pg (ref 26.0–34.0)
MCHC: 34.5 g/dL (ref 30.0–36.0)
MCV: 82.8 fL (ref 78.0–100.0)
Platelets: 194 10*3/uL (ref 150–400)
RBC: 4.48 MIL/uL (ref 4.22–5.81)
RDW: 13 % (ref 11.5–15.5)
WBC: 7.3 10*3/uL (ref 4.0–10.5)

## 2014-07-21 LAB — GLUCOSE, CAPILLARY
Glucose-Capillary: 184 mg/dL — ABNORMAL HIGH (ref 70–99)
Glucose-Capillary: 240 mg/dL — ABNORMAL HIGH (ref 70–99)
Glucose-Capillary: 361 mg/dL — ABNORMAL HIGH (ref 70–99)
Glucose-Capillary: 247 mg/dL — ABNORMAL HIGH (ref 70–99)

## 2014-07-21 LAB — HEPARIN LEVEL (UNFRACTIONATED): Heparin Unfractionated: 0.5 IU/mL (ref 0.30–0.70)

## 2014-07-21 MED ORDER — ALPRAZOLAM 0.5 MG PO TABS
0.5000 mg | ORAL_TABLET | Freq: Two times a day (BID) | ORAL | Status: DC | PRN
  Administered 2014-07-21 – 2014-07-23 (×2): 0.5 mg via ORAL
  Filled 2014-07-21 (×2): qty 1

## 2014-07-21 NOTE — Progress Notes (Signed)
  Subjective: Events noted , cardiac catheterization reviewed The patient was found to have very poor target vessels for grafting due to diffuse diabetic pattern of disease and heavily calcified vessels. The patent graft was to the only adequate target vessel- the LIMA is atretic probably due to competitive flow.  Redo CABG would not benefit the patient and would result in a  poor outcome Rec PCI per cardiology  Objective: Vital signs in last 24 hours: Temp:  [97.7 F (36.5 C)-98.3 F (36.8 C)] 97.7 F (36.5 C) (12/09 0844) Pulse Rate:  [70-79] 79 (12/09 1209) Cardiac Rhythm:  [-] Normal sinus rhythm (12/09 0830) Resp:  [16-18] 18 (12/09 1209) BP: (101-131)/(50-68) 117/56 mmHg (12/09 1700) SpO2:  [99 %-100 %] 99 % (12/09 1209) Weight:  [252 lb 12.8 oz (114.669 kg)] 252 lb 12.8 oz (114.669 kg) (12/09 0456)  Hemodynamic parameters for last 24 hours:    Intake/Output from previous day: 12/08 0701 - 12/09 0700 In: 571.3 [I.V.:571.3] Out: 2950 [Urine:2950] Intake/Output this shift:      Lab Results:  Recent Labs  07/20/14 0241 07/21/14 0333  WBC 7.5 7.3  HGB 12.9* 12.8*  HCT 37.5* 37.1*  PLT 195 194   BMET:  Recent Labs  07/20/14 0241  NA 139  K 4.0  CL 100  CO2 23  GLUCOSE 124*  BUN 16  CREATININE 0.83  CALCIUM 9.2    PT/INR: No results for input(s): LABPROT, INR in the last 72 hours. ABG    Component Value Date/Time   PHART 7.354 04/27/2014 0419   HCO3 24.2* 04/27/2014 0419   TCO2 23 04/27/2014 1710   ACIDBASEDEF 1.0 04/27/2014 0419   O2SAT 93.0 04/27/2014 0419   CBG (last 3)   Recent Labs  07/21/14 0613 07/21/14 1124 07/21/14 1629  GLUCAP 184* 240* 361*    Assessment/Plan: S/P   PCI  Discussed with Dr Fletcher Anon   LOS: 2 days    VAN TRIGT III,Mirjana Tarleton 07/21/2014

## 2014-07-21 NOTE — Progress Notes (Signed)
CARDIAC REHAB PHASE I   PRE:  Rate/Rhythm: 79 SR  BP:  Supine:   Sitting: 131/61  Standing:    SaO2: 100%RA  MODE:  Ambulation: 510 ft   POST:  Rate/Rhythm: 82 SR  BP:  Supine:   Sitting: 145/76  Standing:    SaO2: 98%RA 1015-1045 Pt walked 510 ft with steady gait. Denied CP but stated he felt a little short winded at end of walk. To recliner after walk. Awaiting TCTS consult.   Graylon Good, RN BSN  07/21/2014 10:42 AM

## 2014-07-21 NOTE — Progress Notes (Signed)
Inpatient Diabetes Program Recommendations  AACE/ADA: New Consensus Statement on Inpatient Glycemic Control (2013)  Target Ranges:  Prepandial:   less than 140 mg/dL      Peak postprandial:   less than 180 mg/dL (1-2 hours)      Critically ill patients:  140 - 180 mg/dL   Reason for Assessment:  Results for VARNELL, ORVIS (MRN [id number]) as of 07/21/2014 14:13  Ref. Range 07/20/2014 11:21 07/20/2014 16:20 07/20/2014 21:58 07/21/2014 06:13 07/21/2014 11:24  Glucose-Capillary Latest Range: 70-99 mg/dL 238 (H) 339 (H) 202 (H) 184 (H) 240 (H)   Diabetes history: Type 2 diabetes Outpatient Diabetes medications:  Lantus 150 units daily, Januvia 50 mg daily Current orders for Inpatient glycemic control:  Lantus 80 units q HS, Novolog moderate tid with meals  Please consider increasing Lantus to 90 units q HS.   Also please consider adding Novolog 8 units tid with meals (hold if patient eats less than 50%).  Thanks, Adah Perl, RN, BC-ADM Inpatient Diabetes Coordinator Pager [phone]

## 2014-07-21 NOTE — Plan of Care (Signed)
Problem: Phase I Progression Outcomes Goal: Voiding-avoid urinary catheter unless indicated Outcome: Completed/Met Date Met:  07/21/14     

## 2014-07-21 NOTE — Progress Notes (Signed)
[place] for heparin Indication: CAD and USAP  Allergies  Allergen Reactions  . Metformin And Related Nausea Only  . Statins     REACTION: JOINT ACHES    Patient Measurements: Height: 5\' 9"  (175.3 cm) Weight: 252 lb 12.8 oz (114.669 kg) IBW/kg (Calculated) : 70.7 Heparin Dosing Weight: 96.15 kg  Vital Signs: Temp: 97.7 F (36.5 C) (12/09 0844) Temp Source: Oral (12/09 0844) BP: 117/50 mmHg (12/09 0844) Pulse Rate: 74 (12/09 0844)  Labs:  Recent Labs  07/20/14 0241 07/20/14 1255 07/20/14 1955 07/21/14 0333  HGB 12.9*  --   --  12.8*  HCT 37.5*  --   --  37.1*  PLT 195  --   --  194  HEPARINUNFRC <0.10* 0.24* 0.41 0.50  CREATININE 0.83  --   --   --     Estimated Creatinine Clearance: 113.8 mL/min (by C-G formula based on Cr of 0.83).   Medical History: Past Medical History  Diagnosis Date  . Hypertension 2000  . Environmental allergies   . History of chicken pox   . Syncope and collapse 04/11/14  . Shortness of breath     sometimes, with exertion  . Coronary artery disease September of 2015    New onset angina. Cardiac catheterization showed severe three-vessel and moderate left main disease. Ejection fraction was 45%. He underwent [facility] with LIMA to LAD, SVG to DIAGONAL, SVG to OM1, SVG to PDA  . Hyperlipidemia   . Pneumonia     x3 as an adult-  last time 2009 (07/19/2014)  . OSA on CPAP   . Type II diabetes mellitus 2000  . Stroke     H/o pontine CVA, prev eval by Dr. Erling Cruz  . Arthritis     "probably in my hands" (07/19/2014)  . Prostate cancer     metastatic to neck    Medications:  Scheduled:  . aspirin EC  81 mg Oral Daily  . bicalutamide  50 mg Oral Daily  . ferrous sulfate  325 mg Oral Q breakfast  . furosemide  40 mg Oral Daily  . insulin aspart  0-15 Units Subcutaneous TID WC  . insulin glargine  80 Units Subcutaneous QHS  . lisinopril  2.5 mg Oral Daily  . metoprolol tartrate  12.5 mg Oral  BID  . nitroGLYCERIN  0.5 inch Topical 4 times per day  . potassium chloride  20 mEq Oral Daily  . rosuvastatin  5 mg Oral Daily  . sodium chloride  3 mL Intravenous Q12H   Infusions:  . heparin 1,900 Units/hr (07/21/14 0800)    Assessment: 63 yo male with ACS s/p cath.  He needs CVTS eval for redo CABG as 3/4 of his grafts are occluded.  HL is therapeutic at 0.5 on heparin 1900 units/hr.   Goal of Therapy:  Heparin level 0.3-0.7 units/ml Monitor platelets by anticoagulation protocol: Yes   Plan:  Continue heparin 1900 units/hr Daily HL/CBC Monitor for s/sx of bleeding  Thanks for allowing pharmacy to be a part of this patient's care.  Excell Seltzer, PharmD Clinical Pharmacist, [phone] 07/21/2014,10:20 AM

## 2014-07-21 NOTE — Progress Notes (Signed)
Patient Profile: 63 year old male with a history of CABG in September 2015 developed possible anginal symptoms at cardiac rehabilitation, developed ST elevation during outpatient treadmill, cath showed 50% left main with significant native three-vessel disease SVG-OM 1 patent, significant disease proximally, all other grafts occluded. Patient transferred to [facility] for consideration of redo bypass  Subjective: No chest pain or dyspnea. He feels very anxious and is requesting PRN anxiety medications. He also complains of LBP which he believes is related to sleeping in a "very uncomfortable" hospital bed.   Objective: Vital signs in last 24 hours: Temp:  [97.6 F (36.4 C)-98.3 F (36.8 C)] 97.7 F (36.5 C) (12/09 0844) Pulse Rate:  [70-74] 74 (12/09 0844) Resp:  [16-18] 16 (12/09 0844) BP: (101-131)/(50-57) 117/50 mmHg (12/09 0844) SpO2:  [99 %-100 %] 100 % (12/09 0844) Weight:  [252 lb 12.8 oz (114.669 kg)] 252 lb 12.8 oz (114.669 kg) (12/09 0456) Last BM Date: 07/21/14  Intake/Output from previous day: 12/08 0701 - 12/09 0700 In: 561.3 [I.V.:561.3] Out: 2950 [Urine:2950] Intake/Output this shift: Total I/O In: 398 [P.O.:360; I.V.:38] Out: 3 [Urine:2; Stool:1]  Medications Current Facility-Administered Medications  Medication Dose Route Frequency Provider Last Rate Last Dose  . 0.9 %  sodium chloride infusion  250 mL Intravenous PRN Rogelia Mire, NP      . acetaminophen (TYLENOL) tablet 650 mg  650 mg Oral Q4H PRN Rogelia Mire, NP   650 mg at 07/20/14 1710  . aspirin EC tablet 81 mg  81 mg Oral Daily Rogelia Mire, NP   81 mg at 07/20/14 1009  . bicalutamide (CASODEX) tablet 50 mg  50 mg Oral Daily Rogelia Mire, NP   50 mg at 07/20/14 1009  . ferrous sulfate tablet 325 mg  325 mg Oral Q breakfast Rogelia Mire, NP   325 mg at 07/21/14 3785  . furosemide (LASIX) tablet 40 mg  40 mg Oral Daily Rogelia Mire, NP   40 mg at 07/20/14 1009  .  heparin ADULT infusion 100 units/mL (25000 units/250 mL)  1,900 Units/hr Intravenous Continuous Wellington Hampshire, MD 19 mL/hr at 07/21/14 0800 1,900 Units/hr at 07/21/14 0800  . insulin aspart (novoLOG) injection 0-15 Units  0-15 Units Subcutaneous TID WC Rogelia Mire, NP   3 Units at 07/21/14 [phone]  . insulin glargine (LANTUS) injection 80 Units  80 Units Subcutaneous QHS Rogelia Mire, NP   80 Units at 07/20/14 2205  . lisinopril (PRINIVIL,ZESTRIL) tablet 2.5 mg  2.5 mg Oral Daily Rogelia Mire, NP   2.5 mg at 07/20/14 1009  . metoprolol tartrate (LOPRESSOR) tablet 12.5 mg  12.5 mg Oral BID Rogelia Mire, NP   12.5 mg at 07/20/14 2200  . nitroGLYCERIN (NITROGLYN) 2 % ointment 0.5 inch  0.5 inch Topical 4 times per day Rogelia Mire, NP   0.5 inch at 07/21/14 [phone]  . nitroGLYCERIN (NITROSTAT) SL tablet 0.4 mg  0.4 mg Sublingual Q5 Min x 3 PRN Rogelia Mire, NP      . ondansetron [facility]) injection 4 mg  4 mg Intravenous Q6H PRN Rogelia Mire, NP      . potassium chloride SA (K-DUR,KLOR-CON) CR tablet 20 mEq  20 mEq Oral Daily Rogelia Mire, NP   20 mEq at 07/20/14 1009  . rosuvastatin (CRESTOR) tablet 5 mg  5 mg Oral Daily Rogelia Mire, NP   5 mg at 07/20/14 1009  . sodium chloride 0.9 % injection 3  mL  3 mL Intravenous Q12H Rogelia Mire, NP   3 mL at 07/19/14 2226  . sodium chloride 0.9 % injection 3 mL  3 mL Intravenous PRN Rogelia Mire, NP        PE: General appearance: alert, cooperative and no distress Neck: no carotid bruit and no JVD Lungs: clear to auscultation bilaterally Heart: regular rate and rhythm, S1, S2 normal, no murmur, click, rub or gallop Extremities: no LEE Pulses: 2+ and symmetric Skin: warm and dry Neurologic: Grossly normal  Lab Results:   Recent Labs  07/20/14 0241 07/21/14 0333  WBC 7.5 7.3  HGB 12.9* 12.8*  HCT 37.5* 37.1*  PLT 195 194   BMET  Recent Labs  07/20/14 0241  NA 139  K  4.0  CL 100  CO2 23  GLUCOSE 124*  BUN 16  CREATININE 0.83  CALCIUM 9.2   PT/INR No results for input(s): LABPROT, INR in the last 72 hours. Cholesterol  Recent Labs  07/20/14 0241  CHOL 170    Assessment/Plan  Active Problems:   Acute coronary syndrome  1. CAD: H/O recent CABG in Sept (X4). Recent cath revealed 3/4 grafts gone with severe native vessel disease. Awaiting TCTS consultation to evaluate for possible redo CABG. He denies any chest pain currently. VSS. Continue IV heparin, ASA, BB, ACE and statin.   2. Anxiety: Pt notes increased anxiety given his situation. Will order PRN xanax.   3. LBP: patient contributes to uncomfortable hospital bed. He is now OOB and in a chair. Use PRN tylenol and heating pads.     LOS: 2 days    Brittainy M. Ladoris Gene 07/21/2014 9:35 AM  Agree with note written by Ellen Henri  [facility]  Reviewed cath films this AM with entire IC team. There are percutaneous options for revasc including roto/stent LM/LAD +/- AV groove LCX with med Rx of RCA. On IV hep. No CP. Await Dr PVT to see and make rec/disposition.   Lorretta Harp 07/21/2014 11:36 AM

## 2014-07-22 ENCOUNTER — Encounter (HOSPITAL_COMMUNITY)
Admission: AD | Disposition: A | Discharge status: Home / Self Care | Payer: Self-pay | Source: Other Acute Inpatient Hospital | Attending: Cardiovascular Disease

## 2014-07-22 DIAGNOSIS — I2510 Atherosclerotic heart disease of native coronary artery without angina pectoris: Secondary | ICD-10-CM

## 2014-07-22 DIAGNOSIS — I25700 Atherosclerosis of coronary artery bypass graft(s), unspecified, with unstable angina pectoris: Secondary | ICD-10-CM | POA: Insufficient documentation

## 2014-07-22 HISTORY — PX: PERCUTANEOUS CORONARY ROTOBLATOR INTERVENTION (PCI-R): SHX5484

## 2014-07-22 LAB — CBC
HCT: 37.2 % — ABNORMAL LOW (ref 39.0–52.0)
Hemoglobin: 12.9 g/dL — ABNORMAL LOW (ref 13.0–17.0)
MCH: 28.7 pg (ref 26.0–34.0)
MCHC: 34.7 g/dL (ref 30.0–36.0)
MCV: 82.9 fL (ref 78.0–100.0)
Platelets: 196 10*3/uL (ref 150–400)
RBC: 4.49 MIL/uL (ref 4.22–5.81)
RDW: 13 % (ref 11.5–15.5)
WBC: 7.7 10*3/uL (ref 4.0–10.5)

## 2014-07-22 LAB — HEPARIN LEVEL (UNFRACTIONATED): Heparin Unfractionated: 0.6 IU/mL (ref 0.30–0.70)

## 2014-07-22 LAB — POCT ACTIVATED CLOTTING TIME
Activated Clotting Time: 282 seconds
Activated Clotting Time: 337 seconds
Activated Clotting Time: 337 seconds

## 2014-07-22 LAB — GLUCOSE, CAPILLARY
Glucose-Capillary: 175 mg/dL — ABNORMAL HIGH (ref 70–99)
Glucose-Capillary: 135 mg/dL — ABNORMAL HIGH (ref 70–99)
Glucose-Capillary: 102 mg/dL — ABNORMAL HIGH (ref 70–99)
Glucose-Capillary: 118 mg/dL — ABNORMAL HIGH (ref 70–99)
Glucose-Capillary: 175 mg/dL — ABNORMAL HIGH (ref 70–99)
Glucose-Capillary: 362 mg/dL — ABNORMAL HIGH (ref 70–99)

## 2014-07-22 SURGERY — PERCUTANEOUS CORONARY ROTOBLATOR INTERVENTION (PCI-R)
Anesthesia: LOCAL

## 2014-07-22 MED ORDER — CLOPIDOGREL BISULFATE 75 MG PO TABS
75.0000 mg | ORAL_TABLET | Freq: Every day | ORAL | Status: DC
  Administered 2014-07-23: 75 mg via ORAL
  Filled 2014-07-22 (×2): qty 1

## 2014-07-22 MED ORDER — HEPARIN SODIUM (PORCINE) 1000 UNIT/ML IJ SOLN
INTRAMUSCULAR | Status: AC
  Filled 2014-07-22: qty 1

## 2014-07-22 MED ORDER — FENTANYL CITRATE 0.05 MG/ML IJ SOLN
INTRAMUSCULAR | Status: AC
  Filled 2014-07-22: qty 2

## 2014-07-22 MED ORDER — ASPIRIN 81 MG PO CHEW: 81.0000 mg | CHEWABLE_TABLET | Freq: Every day | ORAL | Status: DC

## 2014-07-22 MED ORDER — VERAPAMIL HCL 2.5 MG/ML IV SOLN
INTRAVENOUS | Status: AC
  Filled 2014-07-22: qty 2

## 2014-07-22 MED ORDER — SODIUM CHLORIDE 0.9 % IV SOLN: 1.0000 mL/kg/h | INTRAVENOUS | Status: AC

## 2014-07-22 MED ORDER — SODIUM CHLORIDE 0.9 % IV SOLN: 1.0000 mL/kg/h | INTRAVENOUS | Status: DC

## 2014-07-22 MED ORDER — MIDAZOLAM HCL 2 MG/2ML IJ SOLN
INTRAMUSCULAR | Status: AC
  Filled 2014-07-22: qty 2

## 2014-07-22 MED ORDER — SODIUM CHLORIDE 0.9 % IJ SOLN: 3.0000 mL | Freq: Two times a day (BID) | INTRAMUSCULAR | Status: DC

## 2014-07-22 MED ORDER — HEPARIN (PORCINE) IN NACL 2-0.9 UNIT/ML-% IJ SOLN
INTRAMUSCULAR | Status: AC
  Filled 2014-07-22: qty 1000

## 2014-07-22 MED ORDER — NITROGLYCERIN 1 MG/10 ML FOR IR/CATH LAB
INTRA_ARTERIAL | Status: AC
  Filled 2014-07-22: qty 10

## 2014-07-22 MED ORDER — CLOPIDOGREL BISULFATE 300 MG PO TABS
ORAL_TABLET | ORAL | Status: AC
  Filled 2014-07-22: qty 1

## 2014-07-22 MED ORDER — LIDOCAINE HCL (PF) 1 % IJ SOLN
INTRAMUSCULAR | Status: AC
  Filled 2014-07-22: qty 30

## 2014-07-22 MED ORDER — SODIUM CHLORIDE 0.9 % IV SOLN: 250.0000 mL | INTRAVENOUS | Status: DC | PRN

## 2014-07-22 MED ORDER — SODIUM CHLORIDE 0.9 % IJ SOLN: 3.0000 mL | INTRAMUSCULAR | Status: DC | PRN

## 2014-07-22 MED ORDER — ASPIRIN 81 MG PO CHEW: 81.0000 mg | CHEWABLE_TABLET | ORAL | Status: DC

## 2014-07-22 NOTE — Interval H&P Note (Signed)
History and Physical Interval Note:  07/22/2014 2:01 PM  Phillip Mimes Sr.  has presented today for surgery, with the diagnosis of cad  The various methods of treatment have been discussed with the patient and family. After consideration of risks, benefits and other options for treatment, the patient has consented to  Procedure(s): PERCUTANEOUS CORONARY ROTOBLATOR INTERVENTION (PCI-R) (N/A) as a surgical intervention .  The patient's history has been reviewed, patient examined, no change in status, stable for surgery.  I have reviewed the patient's chart and labs.  Questions were answered to the patient's satisfaction.   Cath Lab Visit (complete for each Cath Lab visit)  Clinical Evaluation Leading to the Procedure:   ACS: Yes.    Non-ACS:    Anginal Classification: CCS IV  Anti-ischemic medical therapy: Maximal Therapy (2 or more classes of medications)  Non-Invasive Test Results: No non-invasive testing performed  Prior CABG: Previous CABG        Collier Salina [facility] 07/22/2014 2:01 PM

## 2014-07-22 NOTE — Progress Notes (Signed)
1020 Noted that pt for PCI today. We will hold ambulation today and follow up after procedure. Graylon Good RN BSN 07/22/2014 10:17 AM

## 2014-07-22 NOTE — Progress Notes (Signed)
Subjective:  No CP/SOB. On IV hep. For LM/LAD roto-stent by Dr. Martinique later today  Objective:  Temp:  [97.7 F (36.5 C)-98.2 F (36.8 C)] 97.9 F (36.6 C) (12/10 0625) Pulse Rate:  [74-79] 77 (12/10 0625) Resp:  [16-18] 18 (12/10 0625) BP: (107-137)/(50-75) 137/71 mmHg (12/10 0625) SpO2:  [99 %-100 %] 99 % (12/10 0625) Weight:  [252 lb 4.8 oz (114.443 kg)] 252 lb 4.8 oz (114.443 kg) (12/10 0625) Weight change: -8 oz (-0.227 kg)  Intake/Output from previous day: 12/09 0701 - 12/10 0700 In: 835 [P.O.:600; I.V.:235] Out: 2077 [Urine:2076; Stool:1]  Intake/Output from this shift:    Physical Exam: General appearance: alert and no distress Neck: no adenopathy, no carotid bruit, no JVD, supple, symmetrical, trachea midline and thyroid not enlarged, symmetric, no tenderness/mass/nodules Lungs: clear to auscultation bilaterally Heart: regular rate and rhythm, S1, S2 normal, no murmur, click, rub or gallop Extremities: extremities normal, atraumatic, no cyanosis or edema  Lab Results: Results for orders placed or performed during the hospital encounter of 07/19/14 (from the past 48 hour(s))  Glucose, capillary     Status: Abnormal   Collection Time: 07/20/14 11:21 AM  Result Value Ref Range   Glucose-Capillary 238 (H) 70 - 99 mg/dL   Comment 1 Documented in Chart    Comment 2 Notify RN   MRSA PCR Screening     Status: None   Collection Time: 07/20/14 12:41 PM  Result Value Ref Range   MRSA by PCR NEGATIVE NEGATIVE    Comment:        The GeneXpert MRSA Assay (FDA approved for NASAL specimens only), is one component of a comprehensive MRSA colonization surveillance program. It is not intended to diagnose MRSA infection nor to guide or monitor treatment for MRSA infections.   Heparin level (unfractionated)     Status: Abnormal   Collection Time: 07/20/14 12:55 PM  Result Value Ref Range   Heparin Unfractionated 0.24 (L) 0.30 - 0.70 IU/mL    Comment:        IF  HEPARIN RESULTS ARE BELOW EXPECTED VALUES, AND PATIENT DOSAGE HAS BEEN CONFIRMED, SUGGEST FOLLOW UP TESTING OF ANTITHROMBIN III LEVELS.   Glucose, capillary     Status: Abnormal   Collection Time: 07/20/14  4:20 PM  Result Value Ref Range   Glucose-Capillary 339 (H) 70 - 99 mg/dL   Comment 1 Documented in Chart    Comment 2 Notify RN   Heparin level (unfractionated)     Status: None   Collection Time: 07/20/14  7:55 PM  Result Value Ref Range   Heparin Unfractionated 0.41 0.30 - 0.70 IU/mL    Comment:        IF HEPARIN RESULTS ARE BELOW EXPECTED VALUES, AND PATIENT DOSAGE HAS BEEN CONFIRMED, SUGGEST FOLLOW UP TESTING OF ANTITHROMBIN III LEVELS.   Glucose, capillary     Status: Abnormal   Collection Time: 07/20/14  9:58 PM  Result Value Ref Range   Glucose-Capillary 202 (H) 70 - 99 mg/dL   Comment 1 Documented in Chart    Comment 2 Notify RN   Heparin level (unfractionated)     Status: None   Collection Time: 07/21/14  3:33 AM  Result Value Ref Range   Heparin Unfractionated 0.50 0.30 - 0.70 IU/mL    Comment:        IF HEPARIN RESULTS ARE BELOW EXPECTED VALUES, AND PATIENT DOSAGE HAS BEEN CONFIRMED, SUGGEST FOLLOW UP TESTING OF ANTITHROMBIN III LEVELS.   CBC  Status: Abnormal   Collection Time: 07/21/14  3:33 AM  Result Value Ref Range   WBC 7.3 4.0 - 10.5 K/uL   RBC 4.48 4.22 - 5.81 MIL/uL   Hemoglobin 12.8 (L) 13.0 - 17.0 g/dL   HCT 37.1 (L) 39.0 - 52.0 %   MCV 82.8 78.0 - 100.0 fL   MCH 28.6 26.0 - 34.0 pg   MCHC 34.5 30.0 - 36.0 g/dL   RDW 13.0 11.5 - 15.5 %   Platelets 194 150 - 400 K/uL  Glucose, capillary     Status: Abnormal   Collection Time: 07/21/14  6:13 AM  Result Value Ref Range   Glucose-Capillary 184 (H) 70 - 99 mg/dL   Comment 1 Documented in Chart    Comment 2 Notify RN   Glucose, capillary     Status: Abnormal   Collection Time: 07/21/14 11:24 AM  Result Value Ref Range   Glucose-Capillary 240 (H) 70 - 99 mg/dL  Glucose, capillary      Status: Abnormal   Collection Time: 07/21/14  4:29 PM  Result Value Ref Range   Glucose-Capillary 361 (H) 70 - 99 mg/dL   Comment 1 Documented in Chart    Comment 2 Notify RN   Glucose, capillary     Status: Abnormal   Collection Time: 07/21/14  9:18 PM  Result Value Ref Range   Glucose-Capillary 247 (H) 70 - 99 mg/dL  Heparin level (unfractionated)     Status: None   Collection Time: 07/22/14  4:48 AM  Result Value Ref Range   Heparin Unfractionated 0.60 0.30 - 0.70 IU/mL    Comment:        IF HEPARIN RESULTS ARE BELOW EXPECTED VALUES, AND PATIENT DOSAGE HAS BEEN CONFIRMED, SUGGEST FOLLOW UP TESTING OF ANTITHROMBIN III LEVELS.   CBC     Status: Abnormal   Collection Time: 07/22/14  4:48 AM  Result Value Ref Range   WBC 7.7 4.0 - 10.5 K/uL   RBC 4.49 4.22 - 5.81 MIL/uL   Hemoglobin 12.9 (L) 13.0 - 17.0 g/dL   HCT 37.2 (L) 39.0 - 52.0 %   MCV 82.9 78.0 - 100.0 fL   MCH 28.7 26.0 - 34.0 pg   MCHC 34.7 30.0 - 36.0 g/dL   RDW 13.0 11.5 - 15.5 %   Platelets 196 150 - 400 K/uL    Imaging: Imaging results have been reviewed  Tele: NSR [id number]  Assessment/Plan:   1. Active Problems: 2.   Acute coronary syndrome 3. CAD S/P CABG  Time Spent Directly with Patient:  20 minutes  Length of Stay:  LOS: 3 days   Early graft failure 3 months S/P CABG with exertional Sx and markedly + stress test. S/P cath revealing only 1 patent SVG with preserved LV fxn. Case reviewed with entire IC team and with Dr. PVT. Consensus is LM/LAD roto/stent. I have discussed this in detail with patient and wife in detail including options, alternatives risks and benefits and they agree to proceed. Plan procedure later today by Dr. Martinique   Daman Steffenhagen J 07/22/2014, 8:24 AM

## 2014-07-22 NOTE — H&P (View-Only) (Signed)
Subjective:  No CP/SOB. On IV hep. For LM/LAD roto-stent by Dr. Martinique later today  Objective:  Temp:  [97.7 F (36.5 C)-98.2 F (36.8 C)] 97.9 F (36.6 C) (12/10 0625) Pulse Rate:  [74-79] 77 (12/10 0625) Resp:  [16-18] 18 (12/10 0625) BP: (107-137)/(50-75) 137/71 mmHg (12/10 0625) SpO2:  [99 %-100 %] 99 % (12/10 0625) Weight:  [252 lb 4.8 oz (114.443 kg)] 252 lb 4.8 oz (114.443 kg) (12/10 0625) Weight change: -8 oz (-0.227 kg)  Intake/Output from previous day: 12/09 0701 - 12/10 0700 In: 835 [P.O.:600; I.V.:235] Out: 2077 [Urine:2076; Stool:1]  Intake/Output from this shift:    Physical Exam: General appearance: alert and no distress Neck: no adenopathy, no carotid bruit, no JVD, supple, symmetrical, trachea midline and thyroid not enlarged, symmetric, no tenderness/mass/nodules Lungs: clear to auscultation bilaterally Heart: regular rate and rhythm, S1, S2 normal, no murmur, click, rub or gallop Extremities: extremities normal, atraumatic, no cyanosis or edema  Lab Results: Results for orders placed or performed during the hospital encounter of 07/19/14 (from the past 48 hour(s))  Glucose, capillary     Status: Abnormal   Collection Time: 07/20/14 11:21 AM  Result Value Ref Range   Glucose-Capillary 238 (H) 70 - 99 mg/dL   Comment 1 Documented in Chart    Comment 2 Notify RN   MRSA PCR Screening     Status: None   Collection Time: 07/20/14 12:41 PM  Result Value Ref Range   MRSA by PCR NEGATIVE NEGATIVE    Comment:        The GeneXpert MRSA Assay (FDA approved for NASAL specimens only), is one component of a comprehensive MRSA colonization surveillance program. It is not intended to diagnose MRSA infection nor to guide or monitor treatment for MRSA infections.   Heparin level (unfractionated)     Status: Abnormal   Collection Time: 07/20/14 12:55 PM  Result Value Ref Range   Heparin Unfractionated 0.24 (L) 0.30 - 0.70 IU/mL    Comment:        IF  HEPARIN RESULTS ARE BELOW EXPECTED VALUES, AND PATIENT DOSAGE HAS BEEN CONFIRMED, SUGGEST FOLLOW UP TESTING OF ANTITHROMBIN III LEVELS.   Glucose, capillary     Status: Abnormal   Collection Time: 07/20/14  4:20 PM  Result Value Ref Range   Glucose-Capillary 339 (H) 70 - 99 mg/dL   Comment 1 Documented in Chart    Comment 2 Notify RN   Heparin level (unfractionated)     Status: None   Collection Time: 07/20/14  7:55 PM  Result Value Ref Range   Heparin Unfractionated 0.41 0.30 - 0.70 IU/mL    Comment:        IF HEPARIN RESULTS ARE BELOW EXPECTED VALUES, AND PATIENT DOSAGE HAS BEEN CONFIRMED, SUGGEST FOLLOW UP TESTING OF ANTITHROMBIN III LEVELS.   Glucose, capillary     Status: Abnormal   Collection Time: 07/20/14  9:58 PM  Result Value Ref Range   Glucose-Capillary 202 (H) 70 - 99 mg/dL   Comment 1 Documented in Chart    Comment 2 Notify RN   Heparin level (unfractionated)     Status: None   Collection Time: 07/21/14  3:33 AM  Result Value Ref Range   Heparin Unfractionated 0.50 0.30 - 0.70 IU/mL    Comment:        IF HEPARIN RESULTS ARE BELOW EXPECTED VALUES, AND PATIENT DOSAGE HAS BEEN CONFIRMED, SUGGEST FOLLOW UP TESTING OF ANTITHROMBIN III LEVELS.   CBC  Status: Abnormal   Collection Time: 07/21/14  3:33 AM  Result Value Ref Range   WBC 7.3 4.0 - 10.5 K/uL   RBC 4.48 4.22 - 5.81 MIL/uL   Hemoglobin 12.8 (L) 13.0 - 17.0 g/dL   HCT 37.1 (L) 39.0 - 52.0 %   MCV 82.8 78.0 - 100.0 fL   MCH 28.6 26.0 - 34.0 pg   MCHC 34.5 30.0 - 36.0 g/dL   RDW 13.0 11.5 - 15.5 %   Platelets 194 150 - 400 K/uL  Glucose, capillary     Status: Abnormal   Collection Time: 07/21/14  6:13 AM  Result Value Ref Range   Glucose-Capillary 184 (H) 70 - 99 mg/dL   Comment 1 Documented in Chart    Comment 2 Notify RN   Glucose, capillary     Status: Abnormal   Collection Time: 07/21/14 11:24 AM  Result Value Ref Range   Glucose-Capillary 240 (H) 70 - 99 mg/dL  Glucose, capillary      Status: Abnormal   Collection Time: 07/21/14  4:29 PM  Result Value Ref Range   Glucose-Capillary 361 (H) 70 - 99 mg/dL   Comment 1 Documented in Chart    Comment 2 Notify RN   Glucose, capillary     Status: Abnormal   Collection Time: 07/21/14  9:18 PM  Result Value Ref Range   Glucose-Capillary 247 (H) 70 - 99 mg/dL  Heparin level (unfractionated)     Status: None   Collection Time: 07/22/14  4:48 AM  Result Value Ref Range   Heparin Unfractionated 0.60 0.30 - 0.70 IU/mL    Comment:        IF HEPARIN RESULTS ARE BELOW EXPECTED VALUES, AND PATIENT DOSAGE HAS BEEN CONFIRMED, SUGGEST FOLLOW UP TESTING OF ANTITHROMBIN III LEVELS.   CBC     Status: Abnormal   Collection Time: 07/22/14  4:48 AM  Result Value Ref Range   WBC 7.7 4.0 - 10.5 K/uL   RBC 4.49 4.22 - 5.81 MIL/uL   Hemoglobin 12.9 (L) 13.0 - 17.0 g/dL   HCT 37.2 (L) 39.0 - 52.0 %   MCV 82.9 78.0 - 100.0 fL   MCH 28.7 26.0 - 34.0 pg   MCHC 34.7 30.0 - 36.0 g/dL   RDW 13.0 11.5 - 15.5 %   Platelets 196 150 - 400 K/uL    Imaging: Imaging results have been reviewed  Tele: NSR 77  Assessment/Plan:   1. Active Problems: 2.   Acute coronary syndrome 3. CAD S/P CABG  Time Spent Directly with Patient:  20 minutes  Length of Stay:  LOS: 3 days   Early graft failure 3 months S/P CABG with exertional Sx and markedly + stress test. S/P cath revealing only 1 patent SVG with preserved LV fxn. Case reviewed with entire IC team and with Dr. PVT. Consensus is LM/LAD roto/stent. I have discussed this in detail with patient and wife in detail including options, alternatives risks and benefits and they agree to proceed. Plan procedure later today by Dr. Martinique   Phillip Clements J 07/22/2014, 8:24 AM

## 2014-07-22 NOTE — CV Procedure (Signed)
    CARDIAC CATH NOTE  Name: Phillip HARDCASTLE Sr. MRN: [id number] DOB: [birth year]  Procedure: Rotational atherectomy and stenting of the left main and proximal LAD with DES. DES of mid LAD.  Indication: 63 yo diabetic male with history of severe CAD. S/p CABG in September 2015 with early graft failure. SVG to OM1 still patent but other grafts occluded. Patient felt to be a poor candidate for redo CABG and PCI of the left main and LAD recommended.  Procedural Details: The right groin was prepped, draped, and anesthetized with 1% lidocaine. Using the modified Seldinger technique, a 7 Fr sheath was introduced into the right femoral artery.  Weight-based heparin was given for anticoagulation. Once a therapeutic ACT was achieved, a 7  [place] CLS 4.0 guide catheter was inserted.  A Rotofloppy  coronary guidewire was used to cross the lesion.  The left main and proximal LAD were treated with rotational atherectomy initially with a 1.5 mm burr then a 2.0 mm burr. After atherectomy the mid LAD was occluded with TIMI 0 flow. There was a 70% mid vessel lesion noted and this was dilated with a 2.0 mm balloon. The Rotofloppy wire was exchanged for a Prowater wire. The poor flow persisted. the patient was given IV Verapamil but there was no improvement and he became hypotensive. He complained of severe left shoulder pain. We elected to treat the mid LAD with a 2.25 x 24 mm Promus stent. After stent placement there was marked improvement in distal flow and his shoulder pain resolved. We then stented the proximal LAD and left main with a 3.0 x 24 mm Promus stent.  The stent was postdilated with a 3.5 mm noncompliant balloon.  Following PCI, there was 0% residual stenosis and TIMI-3 flow. There was also good flow in the LCx which was protected by the SVG to OM. Final angiography confirmed an excellent result. The patient tolerated the procedure well. There were no immediate procedural complications. Femoral hemostasis was  achieved with an Angioseal device. The patient was transferred to the post catheterization recovery area for further monitoring.  Lesion Data: Vessel: Left main and proximal LAD Percent stenosis (pre): 90% TIMI-flow (pre):  3 Stent:  3.0 x 24 mm Promus, 2.25 x 24 mm Promus in the mid LAD Percent stenosis (post): 0% TIMI-flow (post): 3  Conclusions:  Successful rotational atherectomy and stenting of the left main and proximal LAD with a DES. Abrupt vessel closure due to ruptured plaque in the mid LAD treated successfully with a DES.   Recommendations: DAPT indefinitely. Will monitor overnight in the unit. If there are no complications he may be able to be DC tomorrow.   Phillip Clements, [place]  07/22/2014, 4:32 PM

## 2014-07-22 NOTE — Progress Notes (Signed)
[facility] Progress Note Patient Name: Phillip LAZENBY Sr. DOB: October 09, 1950 MRN: [id number]   Date of Service  07/22/2014  HPI/Events of Note  Pt stable s/p pci  eICU Interventions  No issues identified      Intervention Category Evaluation Type: New Patient Evaluation  Asencion Noble 07/22/2014, 6:53 PM

## 2014-07-22 NOTE — Progress Notes (Signed)
[place] for heparin Indication: CAD and USAP  Allergies  Allergen Reactions  . Metformin And Related Nausea Only  . Statins     REACTION: JOINT ACHES    Patient Measurements: Height: 5\' 9"  (175.3 cm) Weight: 252 lb 4.8 oz (114.443 kg) IBW/kg (Calculated) : 70.7 Heparin Dosing Weight: 96.15 kg  Vital Signs: Temp: 97.9 F (36.6 C) (12/10 0625) Temp Source: Oral (12/10 0625) BP: 137/71 mmHg (12/10 0625) Pulse Rate: 77 (12/10 0625)  Labs:  Recent Labs  07/20/14 0241  07/20/14 1955 07/21/14 0333 07/22/14 0448  HGB 12.9*  --   --  12.8* 12.9*  HCT 37.5*  --   --  37.1* 37.2*  PLT 195  --   --  194 196  HEPARINUNFRC <0.10*  < > 0.41 0.50 0.60  CREATININE 0.83  --   --   --   --   < > = values in this interval not displayed.  Estimated Creatinine Clearance: 113.6 mL/min (by C-G formula based on Cr of 0.83).   Medical History: Past Medical History  Diagnosis Date  . Hypertension 2000  . Environmental allergies   . History of chicken pox   . Syncope and collapse 04/11/14  . Shortness of breath     sometimes, with exertion  . Coronary artery disease September of 2015    New onset angina. Cardiac catheterization showed severe three-vessel and moderate left main disease. Ejection fraction was 45%. [facility] with LIMA to LAD, SVG to DIAGONAL, SVG to OM1, SVG to PDA  . Hyperlipidemia   . Pneumonia     x3 as an adult-  last time 2009 (07/19/2014)  . OSA on CPAP   . Type II diabetes mellitus 2000  . Stroke     H/o pontine CVA, prev eval by Dr. Erling Cruz  . Arthritis     "probably in my hands" (07/19/2014)  . Prostate cancer     metastatic to neck    Medications:  Scheduled:  . [START ON 07/23/2014] aspirin  81 mg Oral Pre-Cath  . aspirin EC  81 mg Oral Daily  . bicalutamide  50 mg Oral Daily  . ferrous sulfate  325 mg Oral Q breakfast  . furosemide  40 mg Oral Daily  . insulin aspart  0-15 Units Subcutaneous TID  WC  . insulin glargine  80 Units Subcutaneous QHS  . lisinopril  2.5 mg Oral Daily  . metoprolol tartrate  12.5 mg Oral BID  . nitroGLYCERIN  0.5 inch Topical 4 times per day  . potassium chloride  20 mEq Oral Daily  . rosuvastatin  5 mg Oral Daily  . sodium chloride  3 mL Intravenous Q12H  . sodium chloride  3 mL Intravenous Q12H   Infusions:  . sodium chloride    . heparin 1,900 Units/hr (07/21/14 2029)    Assessment: 63 yo male with ACS s/p cath.   HL is therapeutic at 0.6 on heparin 1900 units/hr.  Plan LM/LAD roto-stent later today  Goal of Therapy:  Heparin level 0.3-0.7 units/ml Monitor platelets by anticoagulation protocol: Yes   Plan:  Continue heparin 1900 units/hr Daily HL/CBC Monitor for s/sx of bleeding  Thanks for allowing pharmacy to be a part of this patient's care.  Excell Seltzer, PharmD Clinical Pharmacist, [phone] 07/22/2014,12:11 PM

## 2014-07-22 NOTE — Plan of Care (Signed)
Problem: Phase I Progression Outcomes Goal: Vascular site scale level 0 - I Vascular Site Scale Level 0: No bruising/bleeding/hematoma Level I (Mild): Bruising/Ecchymosis, minimal bleeding/ooozing, palpable hematoma < 3 cm Level II (Moderate): Bleeding not affecting hemodynamic parameters, pseudoaneurysm, palpable hematoma > 3 cm Level III (Severe) Bleeding which affects hemodynamic parameters or retroperitoneal hemorrhage  Outcome: Completed/Met Date Met:  07/22/14 Goal: Post Cath/PCI return to appropriate Path Outcome: Completed/Met Date Met:  07/22/14 Goal: Other Phase I Outcomes/Goals Outcome: Not Applicable Date Met:  70/14/10  Problem: Phase II Progression Outcomes Goal: Barriers To Progression Addressed/Resolved Outcome: Completed/Met Date Met:  07/22/14 Goal: Discharge plan in place and appropriate Outcome: Completed/Met Date Met:  07/22/14 Goal: Pain controlled with appropriate interventions Outcome: Completed/Met Date Met:  07/22/14 Goal: Hemodynamically stable Outcome: Completed/Met Date Met:  07/22/14 Goal: Tolerates diet Outcome: Completed/Met Date Met:  07/22/14

## 2014-07-23 ENCOUNTER — Other Ambulatory Visit: Payer: BC Managed Care – PPO

## 2014-07-23 ENCOUNTER — Ambulatory Visit: Payer: BC Managed Care – PPO | Admitting: Cardiovascular Disease

## 2014-07-23 ENCOUNTER — Encounter (HOSPITAL_COMMUNITY): Payer: Self-pay | Admitting: Cardiology

## 2014-07-23 DIAGNOSIS — G4733 Obstructive sleep apnea (adult) (pediatric): Secondary | ICD-10-CM | POA: Diagnosis present

## 2014-07-23 DIAGNOSIS — I10 Essential (primary) hypertension: Secondary | ICD-10-CM | POA: Diagnosis present

## 2014-07-23 DIAGNOSIS — Z9989 Dependence on other enabling machines and devices: Secondary | ICD-10-CM

## 2014-07-23 DIAGNOSIS — I639 Cerebral infarction, unspecified: Secondary | ICD-10-CM

## 2014-07-23 LAB — BASIC METABOLIC PANEL
Anion gap: 14 (ref 5–15)
BUN: 17 mg/dL (ref 6–23)
CO2: 24 mEq/L (ref 19–32)
Calcium: 9.1 mg/dL (ref 8.4–10.5)
Chloride: 99 mEq/L (ref 96–112)
Creatinine, Ser: 0.98 mg/dL (ref 0.50–1.35)
GFR calc Af Amer: >90 mL/min (ref >90)
GFR calc non Af Amer: 86 mL/min — ABNORMAL LOW (ref >90)
Glucose, Bld: 189 mg/dL — ABNORMAL HIGH (ref 70–99)
Potassium: 4.4 mEq/L (ref 3.7–5.3)
Sodium: 137 mEq/L (ref 137–147)

## 2014-07-23 LAB — GLUCOSE, CAPILLARY
Glucose-Capillary: 157 mg/dL — ABNORMAL HIGH (ref 70–99)
Glucose-Capillary: 181 mg/dL — ABNORMAL HIGH (ref 70–99)

## 2014-07-23 LAB — CBC
HCT: 36.9 % — ABNORMAL LOW (ref 39.0–52.0)
Hemoglobin: 12.6 g/dL — ABNORMAL LOW (ref 13.0–17.0)
MCH: 28.3 pg (ref 26.0–34.0)
MCHC: 34.1 g/dL (ref 30.0–36.0)
MCV: 82.9 fL (ref 78.0–100.0)
Platelets: 202 10*3/uL (ref 150–400)
RBC: 4.45 MIL/uL (ref 4.22–5.81)
RDW: 13.1 % (ref 11.5–15.5)
WBC: 8.1 10*3/uL (ref 4.0–10.5)

## 2014-07-23 MED ORDER — CLOPIDOGREL BISULFATE 75 MG PO TABS: 75.0000 mg | ORAL_TABLET | Freq: Every day | ORAL | Status: DC

## 2014-07-23 MED ORDER — INSULIN ASPART 100 UNIT/ML ~~LOC~~ SOLN
0.0000 [IU] | Freq: Three times a day (TID) | SUBCUTANEOUS | Status: DC
  Administered 2014-07-23: 3 [IU] via SUBCUTANEOUS

## 2014-07-23 MED ORDER — ROSUVASTATIN CALCIUM 5 MG PO TABS: 5.0000 mg | ORAL_TABLET | Freq: Every day | ORAL | Status: DC

## 2014-07-23 NOTE — Progress Notes (Signed)
Inpatient Diabetes Program Recommendations  AACE/ADA: New Consensus Statement on Inpatient Glycemic Control (2013)  Target Ranges:  Prepandial:   less than 140 mg/dL      Peak postprandial:   less than 180 mg/dL (1-2 hours)      Critically ill patients:  140 - 180 mg/dL   Inpatient Diabetes Program Recommendations Correction (SSI): add HS scale for CBGs >200 at bedtime Thank you  Raoul Pitch BSN, RN,CDE Inpatient Diabetes Coordinator [phone] (team pager)

## 2014-07-23 NOTE — Progress Notes (Signed)
The patient's coronary angioma and PCI results were reviewed. He has had no chest discomfort since leaving the catheterization laboratory. The right femoral access site is unremarkable. No hematomas noted.  Laboratory data is stable with creatinine of 0.98 Med reconciliation has been done. Patient is ready for discharge and she'll follow-up with Dr. [name] within one week.

## 2014-07-23 NOTE — Discharge Summary (Signed)
Discharge Summary   Patient ID: Phillip AGERTON Sr. MRN: [id number], DOB/AGE: [birth year] 63 y.o. Admit date: 07/19/2014 D/C date:     07/23/2014  Primary Cardiologist: Dr. Fletcher Anon  Principal Problem:   Acute coronary syndrome Active Problems:   Prostate cancer   Hyperlipidemia   Type II diabetes mellitus   CVA (cerebral infarction)   OSA on CPAP   Hypertension   CVA (cerebral vascular accident)    Admission Dates: 07/19/14- 07/23/14 Discharge Diagnosis: Early graft failure s/p successful rotational atherectomy and stenting of the left main and proximal LAD with DES. DES of mid LAD.  HPI: Phillip FRANGOS Sr. is a 63 y.o. male with a history of HTN, CAD s/p recent CABGx4, HLD, OSA on CPAP, T2DM, previous CVA and prostate cancer who was transferred to [facility] on 07/19/14 for further evaluation after a markedly abnormal GXT and subsequent cardiac cath.   He is recently s/p CABG in 04/2014. He had been doing well @ cardiac rehab but recently began to not exertional fatigue, dyspnea, and left arm pain. Through numerous phone conversations with our office, decision was made to pursue stress testing.This was performed on 07/19/14. During exercise he developed 2-3 mm inferior ST elevation and 3-5 mm ST elevation in leads V3-V5. He denied chest or arm pain. He did have dyspnea and lower extremity claudication during exercise. ST changes resolved by 6 mins in recovery. At no point did he have c/p or arm pain. ECG's reviewed by Dr. Rockey Situ and discussed with Dr. Fletcher Anon and he was admitted to [facility] for cardiac cath, which revealed 50% left main with significant native three-vessel disease SVG-OM 1 patent, significant disease proximally, all other grafts occluded. He was transferred to [facility] for admission, heparinization, and CT surgery re-evaluation.   Hospital Course  CAD/ACS- Early graft failure 3 months s/p CABG with exertional Sx and markedly + stress test -- The patient was found to have  very poor target vessels for grafting due to diffuse diabetic pattern of disease and heavily calcified vessels. Seen by Dr. Nils Pyle who felt that redo CABG would not benefit the patient and would result in a poor outcomes. He recommended proceeding with LM/LAD roto/stent.  -- S/p successful rotational atherectomy and stenting of the left main and proximal LAD with DES. DES of mid LAD on 07/22/14 -- Continue DAPT w/ ASA/plavix indefinitely. Continue BB and statin  HTN: Continue lasix 40mg  po qd, lisinopril 2.5 mg, lopressor 12.5 mg bid  HLD: Cont statin.  DM: Resume home meds.   Morbid Obesity: He has been partaking in cardiac rehab.Continue   The patient has had an uncomplicated hospital course and is recovering well. The femoral catheter site is stable. He has been seen by Dr. Tamala Julian today and deemed ready for discharge home. All follow-up appointments have been scheduled.  A written RX for a 30 day free supply of Brilinta was provided for the patient. A work excuse note was provided as well. Discharge medications are listed below.   Discharge Vitals: Blood pressure 104/51, pulse 76, temperature 97.8 F (36.6 C), temperature source Oral, resp. rate 15, height 5\' 9"  (1.753 m), weight 254 lb 3.1 oz (115.3 kg), SpO2 99 %.  Labs: Lab Results  Component Value Date   WBC 8.1 07/23/2014   HGB 12.6* 07/23/2014   HCT 36.9* 07/23/2014   MCV 82.9 07/23/2014   PLT 202 07/23/2014     Recent Labs Lab 07/20/14 0241 07/23/14 0312  NA 139 137  K 4.0 4.4  CL 100 99  CO2 23 24  BUN 16 17  CREATININE 0.83 0.98  CALCIUM 9.2 9.1  PROT 6.5  --   BILITOT 0.7  --   ALKPHOS 78  --   ALT 12  --   AST 11  --   GLUCOSE 124* 189*    Lab Results  Component Value Date   CHOL 170 07/20/2014   HDL 32* 07/20/2014   LDLCALC 116* 07/20/2014   TRIG 108 07/20/2014     Diagnostic Studies/Procedures  07/23/14 CARDIAC CATH NOTE Name: Phillip KONECNY Sr. MRN: [id number] DOB:  [birth year] Procedure: Rotational atherectomy and stenting of the left main and proximal LAD with DES. DES of mid LAD. Indication: 63 yo diabetic male with history of severe CAD. S/p CABG in September 2015 with early graft failure. SVG to OM1 still patent but other grafts occluded. Patient felt to be a poor candidate for redo CABG and PCI of the left main and LAD recommended. Conclusions:  Successful rotational atherectomy and stenting of the left main and proximal LAD with a DES. Abrupt vessel closure due to ruptured plaque in the mid LAD treated successfully with a DES.  Recommendations: DAPT indefinitely. Will monitor overnight in the unit. If there are no complications he may be able to be DC tomorrow.   Discharge Medications     Medication List    TAKE these medications        aspirin 325 MG EC tablet  Take 1 tablet (325 mg total) by mouth daily.     bicalutamide 50 MG tablet  Commonly known as:  CASODEX  Take 50 mg by mouth daily.     budesonide-formoterol 160-4.5 MCG/ACT inhaler  Commonly known as:  SYMBICORT  Inhale 2 puffs into the lungs 2 (two) times daily.     clopidogrel 75 MG tablet  Commonly known as:  PLAVIX  Take 1 tablet (75 mg total) by mouth daily with breakfast.     ferrous sulfate 325 (65 FE) MG tablet  Take 1 tablet (325 mg total) by mouth daily with breakfast. For one month then stop.     furosemide 40 MG tablet  Commonly known as:  LASIX  Take 1 tablet (40 mg total) by mouth daily.     glucose blood test strip  by Other route. Use daily as ordered. Code: 250.00     insulin glargine 100 UNIT/ML injection  Commonly known as:  LANTUS  Inject 150 Units into the skin daily. Disp 15 pens     Insulin Pen Needle 31G X 8 MM Misc  Use as directed to inject blood sugar twice daily dx:250.00     leuprolide 30 MG injection  Commonly known as:  LUPRON  Inject 45 mg into the muscle every 6 (six) months.     lisinopril 2.5 MG tablet  Commonly known as:   ZESTRIL  Take 1 tablet (2.5 mg total) by mouth daily.     metoprolol tartrate 25 MG tablet  Commonly known as:  LOPRESSOR  Take 0.5 tablets (12.5 mg total) by mouth 2 (two) times daily.     potassium chloride SA 20 MEQ tablet  Commonly known as:  K-DUR,KLOR-CON  Take 1 tablet (20 mEq total) by mouth daily.     rosuvastatin 5 MG tablet  Commonly known as:  CRESTOR  Take 1 tablet (5 mg total) by mouth daily.     sitaGLIPtin 50 MG tablet  Commonly known as:  JANUVIA  Take 1 tablet (50 mg total) by mouth daily.        Disposition   The patient will be discharged in stable condition to home.  Follow-up Information    Follow up with [name], MD On 07/30/2014.   Specialty:  Cardiology   Why:  @ 2:45 pm    Contact information:   [address] [place] [address] [phone]         Duration of Discharge Encounter: Greater than 30 minutes including physician and PA time.  Audrie Gallus, Delsin Copen R PA-C 07/23/2014, 3:08 PM

## 2014-07-23 NOTE — Progress Notes (Signed)
CARDIAC REHAB PHASE I   PRE:  Rate/Rhythm: out walking with NT    BP: sitting 109/30    SaO2:   MODE:  Ambulation: 700 ft   POST:  Rate/Rhythm: 103 ST    BP: sitting 153/58     SaO2:   Tolerated well, feels good. Ready to go home. Ed completed/reviewed. Commended pt on quitting smoking and watching his diet and exercising.  [id number]   Josephina Shih [place] CES, ACSM 07/23/2014 10:18 AM

## 2014-07-23 NOTE — Progress Notes (Signed)
07/23/2014 4:08 PM   DC instructions given. Stated understading. IV's removed and taken to car via [place], [employer]

## 2014-07-30 ENCOUNTER — Ambulatory Visit (INDEPENDENT_AMBULATORY_CARE_PROVIDER_SITE_OTHER): Payer: BC Managed Care – PPO | Admitting: Cardiovascular Disease

## 2014-07-30 ENCOUNTER — Encounter: Payer: Self-pay | Admitting: Cardiovascular Disease

## 2014-07-30 ENCOUNTER — Other Ambulatory Visit: Payer: Self-pay | Admitting: Family Medicine

## 2014-07-30 VITALS — BP 128/76 | HR 78 | Ht 69.0 in | Wt 256.2 lb

## 2014-07-30 DIAGNOSIS — Z9889 Other specified postprocedural states: Secondary | ICD-10-CM

## 2014-07-30 DIAGNOSIS — I25708 Atherosclerosis of coronary artery bypass graft(s), unspecified, with other forms of angina pectoris: Secondary | ICD-10-CM

## 2014-07-30 DIAGNOSIS — I10 Essential (primary) hypertension: Secondary | ICD-10-CM

## 2014-07-30 DIAGNOSIS — E785 Hyperlipidemia, unspecified: Secondary | ICD-10-CM

## 2014-07-30 DIAGNOSIS — R0602 Shortness of breath: Secondary | ICD-10-CM

## 2014-07-30 MED ORDER — FUROSEMIDE 40 MG PO TABS: 40.0000 mg | ORAL_TABLET | Freq: Every day | ORAL | Status: DC

## 2014-07-30 MED ORDER — POTASSIUM CHLORIDE CRYS ER 20 MEQ PO TBCR: 20.0000 meq | EXTENDED_RELEASE_TABLET | Freq: Every day | ORAL | Status: DC | PRN

## 2014-07-30 MED ORDER — FUROSEMIDE 40 MG PO TABS: 40.0000 mg | ORAL_TABLET | Freq: Every day | ORAL | Status: DC | PRN

## 2014-07-30 NOTE — Assessment & Plan Note (Signed)
Blood pressure is well controlled on current medications. 

## 2014-07-30 NOTE — Assessment & Plan Note (Signed)
He reports significant knee pain and thinks it's related to Crestor. He is already on a small dose. Lab Results  Component Value Date   CHOL 170 07/20/2014   HDL 32* 07/20/2014   LDLCALC 116* 07/20/2014   LDLDIRECT 157.1 09/07/2013   TRIG 108 07/20/2014   CHOLHDL 5.3 07/20/2014   I asked him to resume physical activities and see how he feels. Obviously, there is a strong indication for treatment with a statin

## 2014-07-30 NOTE — Progress Notes (Signed)
Primary care physician: Dr. Damita Dunnings  HPI  This is a pleasant 63 year old man who is here today for followup visit regarding coronary artery disease and recent CABG.  He has known history of diabetes for at least 25 years of duration which has not been optimally controlled. He also has other medical conditions that include  Hyperlipidemia, tobacco use, obesity and prostate cancer. He was seen in September for new onset progressive angina . I proceeded with cardiac catheterization which showed severe three-vessel and moderate left main disease. Ejection fraction was 45%. [facility] with LIMA to LAD, SVG to DIAGONAL, SVG to OM1, SVG to PDA. He had postoperative atrial fibrillation which was treated with amiodarone.  He developed symptoms of exertional dyspnea and left arm discomfort in November with some evidence of fluid overload. He was started on furosemide with slight improvement. He underwent a nuclear stress test which was abnormal. He had inferolateral ST elevation with exercise. Nuclear imaging showed evidence of inferior as well as anterior ischemia. I proceeded with cardiac catheterization which showed occluded grafts except for SVG to OM 2. LIMA to LAD was atretic. Ejection fraction was 55%. He was transferred to [facility]. He was seen by Dr. Prescott Gum and was deemed to be not a candidate for redo CABG due to poor targets. He underwent high-risk atherectomy of the left main and LAD with stenting of the mid and ostial LAD by Dr. Martinique. He has been doing well since hospital discharge and denies any recurrent arm discomfort or dyspnea.     Allergies  Allergen Reactions  . Metformin And Related Nausea Only  . Statins     REACTION: JOINT ACHES     Current Outpatient Prescriptions on File Prior to Visit  Medication Sig Dispense Refill  . aspirin EC 325 MG EC tablet Take 1 tablet (325 mg total) by mouth daily. 30 tablet 0  . bicalutamide (CASODEX) 50 MG tablet Take 50 mg by mouth  daily.    . budesonide-formoterol (SYMBICORT) 160-4.5 MCG/ACT inhaler Inhale 2 puffs into the lungs 2 (two) times daily. 1 Inhaler 1  . clopidogrel (PLAVIX) 75 MG tablet Take 1 tablet (75 mg total) by mouth daily with breakfast. 30 tablet 11  . ferrous sulfate 325 (65 FE) MG tablet Take 1 tablet (325 mg total) by mouth daily with breakfast. For one month then stop.  3  . furosemide (LASIX) 40 MG tablet Take 1 tablet (40 mg total) by mouth daily. 30 tablet 3  . glucose blood test strip by Other route. Use daily as ordered. Code: 250.00     . insulin glargine (LANTUS) 100 UNIT/ML injection Inject 150 Units into the skin daily. Disp 15 pens    . Insulin Pen Needle 31G X 8 MM MISC Use as directed to inject blood sugar twice daily dx:250.00 100 each 12  . leuprolide (LUPRON) 30 MG injection Inject 45 mg into the muscle every 6 (six) months.    Marland Kitchen lisinopril (ZESTRIL) 2.5 MG tablet Take 1 tablet (2.5 mg total) by mouth daily. 90 tablet 3  . metoprolol tartrate (LOPRESSOR) 25 MG tablet Take 0.5 tablets (12.5 mg total) by mouth 2 (two) times daily. 30 tablet 6  . potassium chloride SA (K-DUR,KLOR-CON) 20 MEQ tablet Take 1 tablet (20 mEq total) by mouth daily. 30 tablet 3  . rosuvastatin (CRESTOR) 5 MG tablet Take 1 tablet (5 mg total) by mouth daily. 30 tablet 11  . sitaGLIPtin (JANUVIA) 50 MG tablet Take 1 tablet (  50 mg total) by mouth daily. 90 tablet 1   No current facility-administered medications on file prior to visit.     Past Medical History  Diagnosis Date  . Hypertension 2000  . Coronary artery disease     a. CABG 04/2014: (LIMA--> LAD, SVG --> DIAG, SVG--> OM1, SVG--> PDA)  b. early graft failure. s/p successful rotational atherectomy and stenting of the left main and proximal LAD with DES. DES of mid LAD.(07/22/14)  . Hyperlipidemia   . OSA on CPAP   . Type II diabetes mellitus   . CVA (cerebral infarction)   . Prostate cancer      Past Surgical History  Procedure Laterality Date    . Prostate biopsy  04/03/06 & 02/25/07  . High intense focused ultrasound  07/20/06    By Dr. Jacqlyn Larsen  . Cytoscopy prostatic stone o/w nml  06/21/08    Dr. Jacqlyn Larsen  . Ett myoview  09/13/09    Low risk EF 49%  . Anterior cervical decomp/discectomy fusion  08/25/2012    Procedure: ANTERIOR CERVICAL DECOMPRESSION/DISCECTOMY FUSION 2 LEVELS;  Surgeon: Hosie Spangle, MD;  Location: [place] NEURO ORS;  Service: Neurosurgery;  Laterality: N/A;  Cervical five-six Cervical six-seven anterior cervical decompression with fusion and plating and bonegraft  . Intraoperative transesophageal echocardiogram N/A 04/26/2014    Procedure: INTRAOPERATIVE TRANSESOPHAGEAL ECHOCARDIOGRAM;  Surgeon: Ivin Poot, MD;  Location: [place];  Service: Open Heart Surgery;  Laterality: N/A;  . Coronary artery bypass graft N/A 04/26/2014    Procedure: CORONARY ARTERY BYPASS GRAFTING (CABG), on pump, times four, using left internal mammary artery, right greater saphenous vein harvested endoscopically.;  Surgeon: Ivin Poot, MD;  Location: [place];  Service: Open Heart Surgery;  Laterality: N/A;  LIMA to LAD, SVG to DIAGONAL, SVG to OM1, SVG to PDA) with EVH of the RIGHT THIGH and LOWER EXTREMITY SAPHENOUS VEIN  . Back surgery    . Percutaneous coronary rotoblator intervention (pci-r) N/A 07/22/2014    Procedure: PERCUTANEOUS CORONARY ROTOBLATOR INTERVENTION (PCI-R);  Surgeon: Peter M Martinique, MD;  Location: [facility] CATH LAB;  Service: Cardiovascular;  Laterality: N/A;  . Cardiac catheterization  04/2014; 07/19/2014     Family History  Problem Relation Age of Onset  . Diabetes Mother 54    DM  . Coronary artery disease Mother   . Hypertension Mother   . Heart attack Mother     CABG with MI in 2005  . Heart disease Mother     CV  . Hypertension Father   . Heart attack Father     CABG with Mi around 1997  . Coronary artery disease Father   . Heart disease Father     CV  . Heart attack Brother     MI/PTCA  . Heart disease Brother      CV  . Heart attack Maternal Grandfather     MI  . Prostate cancer Paternal Grandfather   . Breast cancer Neg Hx     Breast/ovarian/uterine cancer  . Colon cancer Neg Hx   . Depression Neg Hx   . Alcohol abuse Neg Hx     ETOH/drug abuse  . Stroke Neg Hx   . Diabetes Sister     DM     History   Social History  . Marital Status: Married    Spouse Name: N/A    Number of Children: 3  . Years of Education: N/A   Occupational History  . Scientist, research (physical sciences) and Distribution  [employer]    Social History Main Topics  . Smoking status: Former Smoker -- 1.00 packs/day for 36 years    Types: Cigarettes    Quit date: 03/01/2014  . Smokeless tobacco: Never Used  . Alcohol Use: Yes     Comment: 07/19/2014 "might have a drink a couple times/yr"  . Drug Use: No  . Sexual Activity: Not Currently   Other Topics Concern  . Not on file   Social History Narrative   Lives with wife.   2 daughters and 1 son.   [employer] fan   Runs [employer]     ROS A 10 point review of system was performed. It is negative other than that mentioned in the history of present illness.   PHYSICAL EXAM   BP 128/76 mmHg  Pulse 78  Ht 5\' 9"  (1.753 m)  Wt 256 lb 4 oz (116.234 kg)  BMI 37.82 kg/m2 Constitutional: He is oriented to person, place, and time. He appears well-developed and well-nourished. No distress.  HENT: No nasal discharge.  Head: Normocephalic and atraumatic.  Eyes: Pupils are equal and round.  No discharge. Neck: Normal range of motion. Neck supple. No JVD present. No thyromegaly present.  Cardiovascular: Normal rate, regular rhythm, normal heart sounds. Exam reveals no gallop and no friction rub. No murmur heard.  Pulmonary/Chest: Effort normal and breath sounds normal. No stridor. No respiratory distress. He has no wheezes. He has no rales. He exhibits no tenderness.  Abdominal: Soft. Bowel sounds are normal. He exhibits no distension. There is no tenderness. There is no  rebound and no guarding.  Musculoskeletal: Normal range of motion. He exhibits no edema and no tenderness.  Neurological: He is alert and oriented to person, place, and time. Coordination normal.  Skin: Skin is warm and dry. No rash noted. He is not diaphoretic. No erythema. No pallor.  Psychiatric: He has a normal mood and affect. His behavior is normal. Judgment and thought content normal.       OHF:GBMSX  Rhythm  -Anteroseptal infarct -age undetermined  -Old inferior infarct.   ABNORMAL     ASSESSMENT AND PLAN

## 2014-07-30 NOTE — Assessment & Plan Note (Signed)
The patient is doing reasonably well after recent PCI and drug-eluting stent placement to the left main and LAD. He still has moderate disease in the SVG to OM 2. The RCA is chronically occluded with left-to-right collaterals. Ejection fraction was low normal. I recommend continuing dual antiplatelet therapy indefinitely. He can resume cardiac rehabilitation on Monday.

## 2014-07-30 NOTE — Patient Instructions (Signed)
Your physician has recommended you make the following change in your medication:  Change Lasix to 40 mg once daily as needed   Your physician recommends that you schedule a follow-up appointment in:  3 months with Dr. Fletcher Anon   Resume cardiac rehab

## 2014-08-04 ENCOUNTER — Ambulatory Visit (INDEPENDENT_AMBULATORY_CARE_PROVIDER_SITE_OTHER): Payer: BC Managed Care – PPO | Admitting: Cardiothoracic Surgery

## 2014-08-04 ENCOUNTER — Ambulatory Visit: Payer: BC Managed Care – PPO | Admitting: Cardiothoracic Surgery

## 2014-08-04 ENCOUNTER — Encounter: Payer: Self-pay | Admitting: Cardiothoracic Surgery

## 2014-08-04 VITALS — BP 120/68 | HR 68 | Resp 20 | Ht 69.0 in | Wt 259.0 lb

## 2014-08-04 DIAGNOSIS — I25119 Atherosclerotic heart disease of native coronary artery with unspecified angina pectoris: Secondary | ICD-10-CM

## 2014-08-04 DIAGNOSIS — Z951 Presence of aortocoronary bypass graft: Secondary | ICD-10-CM

## 2014-08-04 NOTE — Progress Notes (Signed)
PCP is Elsie Stain, MD Referring Provider is Wellington Hampshire, MD  Chief Complaint  Patient presents with  . Routine Post Op    2 month f/u     HPI: 63 year old obese diabetic reformed smoker returns for postoperative followup. He had CABG x4 in September. He had severe diabetic disease. He did well for 3 months but then returned with recurrent angina. Cardiac catheterization demonstrated patent vein graft to the obtuse marginal but) graft to the diagonal and PDA. Mammary artery was atretic consistent with competitive flow. The patient underwent a gastrectomy gas PCI to the proximal LAD has done well. He is on Plavix, aspirin, and Crestor. He play golf last week. His had no recurrent angina. He was recently seen by his cardiologist, Dr. Sophronia Simas in [place]. He is planning on resuming cardiac rehabilitation at [facility] after the holiday.  Past Medical History  Diagnosis Date  . Hypertension 2000  . Coronary artery disease     a. CABG 04/2014: (LIMA--> LAD, SVG --> DIAG, SVG--> OM1, SVG--> PDA)  b. early graft failure. s/p successful rotational atherectomy and stenting of the left main and proximal LAD with DES. DES of mid LAD.(07/22/14)  . Hyperlipidemia   . OSA on CPAP   . Type II diabetes mellitus   . CVA (cerebral infarction)   . Prostate cancer     Past Surgical History  Procedure Laterality Date  . Prostate biopsy  04/03/06 & 02/25/07  . High intense focused ultrasound  07/20/06    By Dr. Jacqlyn Larsen  . Cytoscopy prostatic stone o/w nml  06/21/08    Dr. Jacqlyn Larsen  . Ett myoview  09/13/09    Low risk EF 49%  . Anterior cervical decomp/discectomy fusion  08/25/2012    Procedure: ANTERIOR CERVICAL DECOMPRESSION/DISCECTOMY FUSION 2 LEVELS;  Surgeon: Hosie Spangle, MD;  Location: [place] NEURO ORS;  Service: Neurosurgery;  Laterality: N/A;  Cervical five-six Cervical six-seven anterior cervical decompression with fusion and plating and bonegraft  . Intraoperative transesophageal  echocardiogram N/A 04/26/2014    Procedure: INTRAOPERATIVE TRANSESOPHAGEAL ECHOCARDIOGRAM;  Surgeon: Ivin Poot, MD;  Location: [place];  Service: Open Heart Surgery;  Laterality: N/A;  . Coronary artery bypass graft N/A 04/26/2014    Procedure: CORONARY ARTERY BYPASS GRAFTING (CABG), on pump, times four, using left internal mammary artery, right greater saphenous vein harvested endoscopically.;  Surgeon: Ivin Poot, MD;  Location: [place];  Service: Open Heart Surgery;  Laterality: N/A;  LIMA to LAD, SVG to DIAGONAL, SVG to OM1, SVG to PDA) with EVH of the RIGHT THIGH and LOWER EXTREMITY SAPHENOUS VEIN  . Back surgery    . Percutaneous coronary rotoblator intervention (pci-r) N/A 07/22/2014    Procedure: PERCUTANEOUS CORONARY ROTOBLATOR INTERVENTION (PCI-R);  Surgeon: Kyzer Blowe M Martinique, MD;  Location: [facility] CATH LAB;  Service: Cardiovascular;  Laterality: N/A;  . Cardiac catheterization  04/2014; 07/19/2014    Family History  Problem Relation Age of Onset  . Diabetes Mother 47    DM  . Coronary artery disease Mother   . Hypertension Mother   . Heart attack Mother     CABG with MI in 2005  . Heart disease Mother     CV  . Hypertension Father   . Heart attack Father     CABG with Mi around 1997  . Coronary artery disease Father   . Heart disease Father     CV  . Heart attack Brother     MI/PTCA  . Heart  disease Brother     CV  . Heart attack Maternal Grandfather     MI  . Prostate cancer Paternal Grandfather   . Breast cancer Neg Hx     Breast/ovarian/uterine cancer  . Colon cancer Neg Hx   . Depression Neg Hx   . Alcohol abuse Neg Hx     ETOH/drug abuse  . Stroke Neg Hx   . Diabetes Sister     DM    Social History History  Substance Use Topics  . Smoking status: Former Smoker -- 1.00 packs/day for 36 years    Types: Cigarettes    Quit date: 03/01/2014  . Smokeless tobacco: Never Used  . Alcohol Use: Yes     Comment: 07/19/2014 "might have a drink a couple times/yr"     Current Outpatient Prescriptions  Medication Sig Dispense Refill  . aspirin EC 325 MG EC tablet Take 1 tablet (325 mg total) by mouth daily. 30 tablet 0  . bicalutamide (CASODEX) 50 MG tablet Take 50 mg by mouth daily.    . clopidogrel (PLAVIX) 75 MG tablet Take 1 tablet (75 mg total) by mouth daily with breakfast. 30 tablet 11  . ferrous sulfate 325 (65 FE) MG tablet Take 1 tablet (325 mg total) by mouth daily with breakfast. For one month then stop.  3  . furosemide (LASIX) 40 MG tablet Take 1 tablet (40 mg total) by mouth daily as needed for fluid or edema. 30 tablet 3  . insulin glargine (LANTUS) 100 UNIT/ML injection Inject 150 Units into the skin daily. Disp 15 pens    . Insulin Pen Needle 31G X 8 MM MISC Use as directed to inject blood sugar twice daily dx:250.00 100 each 12  . leuprolide (LUPRON) 30 MG injection Inject 45 mg into the muscle every 6 (six) months.    Marland Kitchen lisinopril (ZESTRIL) 2.5 MG tablet Take 1 tablet (2.5 mg total) by mouth daily. 90 tablet 3  . metoprolol tartrate (LOPRESSOR) 25 MG tablet Take 0.5 tablets (12.5 mg total) by mouth 2 (two) times daily. 30 tablet 6  . ONE TOUCH ULTRA TEST test strip USE AS DIRECTED TO TEST BLOOD SUGARS TWICE DAILY 200 each 3  . potassium chloride SA (K-DUR,KLOR-CON) 20 MEQ tablet Take 1 tablet (20 mEq total) by mouth daily as needed (Take one tabley if you take a Lasix tablet). 30 tablet 3  . rosuvastatin (CRESTOR) 5 MG tablet Take 1 tablet (5 mg total) by mouth daily. 30 tablet 11  . sitaGLIPtin (JANUVIA) 50 MG tablet Take 1 tablet (50 mg total) by mouth daily. 90 tablet 1   No current facility-administered medications for this visit.    Allergies  Allergen Reactions  . Metformin And Related Nausea Only  . Statins     REACTION: JOINT ACHES    Review of Systemsfeeling well, no angina  BP 120/68 mmHg  Pulse 68  Resp 20  Ht 5\' 9"  (1.753 m)  Wt 259 lb (117.482 kg)  BMI 38.23 kg/m2  SpO2 98% Physical Exam Alert and  pleasant Lungs clear Heart rate regular Sternal incision well-healed No pedal edema  Diagnostic Tests: Last chest x-ray shows clear lung fields  Impression: Doing well now after postop graft dysfunction subsequently treated with PCI of the proximal LAD  Plan:return in 4 months to monitor progress. Resuming cardiac rehabilitation at [facility] is recommended

## 2014-08-13 ENCOUNTER — Encounter: Payer: Self-pay | Admitting: Cardiothoracic Surgery

## 2014-08-24 ENCOUNTER — Other Ambulatory Visit: Payer: Self-pay | Admitting: Family Medicine

## 2014-08-31 ENCOUNTER — Encounter: Payer: Self-pay | Admitting: Cardiovascular Disease

## 2014-09-01 ENCOUNTER — Other Ambulatory Visit: Payer: Self-pay | Admitting: Family Medicine

## 2014-09-01 NOTE — Telephone Encounter (Signed)
Received refill request electronically from pharmacy. Last office visit 03/29/14. Is it okay to refill medication?

## 2014-09-01 NOTE — Telephone Encounter (Signed)
Denied, needs to come through urology clinic.  Thanks.

## 2014-09-10 ENCOUNTER — Telehealth: Payer: Self-pay | Admitting: *Deleted

## 2014-09-10 NOTE — Telephone Encounter (Signed)
Nurse Case Manager from [facility] Carlyon Shadow) called to say that the patient has enrolled in their program that provides nursing support and education over the phone.  This is just a courtesy call to let the provider know of this service.  No return call is required.

## 2014-09-12 NOTE — Telephone Encounter (Signed)
Noted, thanks!

## 2014-09-24 ENCOUNTER — Telehealth: Payer: Self-pay

## 2014-09-24 DIAGNOSIS — IMO0002 Reserved for concepts with insufficient information to code with codable children: Secondary | ICD-10-CM

## 2014-09-24 DIAGNOSIS — E1165 Type 2 diabetes mellitus with hyperglycemia: Secondary | ICD-10-CM

## 2014-09-24 NOTE — Telephone Encounter (Signed)
Pt left /vm requesting endocrinology referral to Margarito Courser at [facility]. Pt last saw Dr Damita Dunnings 03/29/14.Please advise.

## 2014-09-26 NOTE — Telephone Encounter (Signed)
Ordered. Thanks

## 2014-09-27 NOTE — Telephone Encounter (Signed)
Left detailed message on voicemail.  

## 2014-09-30 ENCOUNTER — Other Ambulatory Visit: Payer: Self-pay | Admitting: Family Medicine

## 2014-10-04 LAB — HM DIABETES EYE EXAM

## 2014-10-08 ENCOUNTER — Other Ambulatory Visit: Payer: Self-pay | Admitting: Family Medicine

## 2014-10-15 ENCOUNTER — Encounter: Payer: Self-pay | Admitting: Family Medicine

## 2014-10-20 ENCOUNTER — Ambulatory Visit: Payer: Self-pay | Admitting: Ophthalmology

## 2014-10-21 ENCOUNTER — Encounter: Payer: Self-pay | Admitting: Endocrinology

## 2014-10-21 ENCOUNTER — Ambulatory Visit (INDEPENDENT_AMBULATORY_CARE_PROVIDER_SITE_OTHER): Payer: BLUE CROSS/BLUE SHIELD | Admitting: Endocrinology

## 2014-10-21 VITALS — BP 126/70 | HR 76 | Temp 97.8°F | Resp 16 | Ht 69.0 in | Wt 268.8 lb

## 2014-10-21 DIAGNOSIS — E119 Type 2 diabetes mellitus without complications: Secondary | ICD-10-CM

## 2014-10-21 DIAGNOSIS — I10 Essential (primary) hypertension: Secondary | ICD-10-CM

## 2014-10-21 DIAGNOSIS — E785 Hyperlipidemia, unspecified: Secondary | ICD-10-CM

## 2014-10-21 LAB — MICROALBUMIN / CREATININE URINE RATIO
Creatinine,U: 114 mg/dL
Microalb Creat Ratio: 13.4 mg/g (ref 0.0–30.0)
Microalb, Ur: 15.3 mg/dL — ABNORMAL HIGH (ref 0.0–1.9)

## 2014-10-21 LAB — COMPREHENSIVE METABOLIC PANEL
ALT: 17 U/L (ref 0–53)
AST: 12 U/L (ref 0–37)
Albumin: 4.2 g/dL (ref 3.5–5.2)
Alkaline Phosphatase: 80 U/L (ref 39–117)
BUN: 17 mg/dL (ref 6–23)
CO2: 29 mEq/L (ref 19–32)
Calcium: 9.4 mg/dL (ref 8.4–10.5)
Chloride: 100 mEq/L (ref 96–112)
Creatinine, Ser: 0.88 mg/dL (ref 0.40–1.50)
GFR: 92.87 mL/min (ref >60.00)
Glucose, Bld: 261 mg/dL — ABNORMAL HIGH (ref 70–99)
Potassium: 4.5 mEq/L (ref 3.5–5.1)
Sodium: 133 mEq/L — ABNORMAL LOW (ref 135–145)
Total Bilirubin: 0.8 mg/dL (ref 0.2–1.2)
Total Protein: 6.8 g/dL (ref 6.0–8.3)

## 2014-10-21 LAB — HM DIABETES FOOT EXAM: HM Diabetic Foot Exam: NORMAL

## 2014-10-21 LAB — HEMOGLOBIN A1C: Hgb A1c MFr Bld: 9.1 % — ABNORMAL HIGH (ref 4.6–6.5)

## 2014-10-21 MED ORDER — INSULIN GLARGINE 300 UNIT/ML ~~LOC~~ SOPN: 150.0000 [IU] | PEN_INJECTOR | Freq: Every day | SUBCUTANEOUS | Status: DC

## 2014-10-21 MED ORDER — ALBIGLUTIDE 30 MG ~~LOC~~ PEN: 30.0000 mg | PEN_INJECTOR | SUBCUTANEOUS | Status: DC

## 2014-10-21 NOTE — Progress Notes (Signed)
Reason for visit-  Phillip ZOLLNER Sr. is a 64 y.o.-year-old male, referred by his PCP,  Tonia Ghent, MD for management of Type 2 diabetes, uncontrolled, without complications. Associated hx CVA, CAD s/p ACS s/p CABG Sept 2015, post op Afib   HPI- Patient has been diagnosed with diabetes in 1996. Recalls being initially on lifestyle modifications.  Tried  Metformin, Glipizide?,  Januvia, Victoza. he has  been on insulin since  2008  .Tried levemir, regular  and Humalog in the past  *was previously on Novolog as well 2015-was tough with his routine. Sales job, traveling in the day. Checking sugars also a problem.  * Didn't tolerate  Metformin due to GI upset.Unsure whether other forms tried. Last used 10 years ago.   Pt is currently on a regimen of:  - Januvia 50 mg daily - Lantus 150 units qhs ( dose was temporarily reduced to 80 units post CABG and since then gradually been increased)    Last hemoglobin A1c was: Lab Results  Component Value Date   HGBA1C 9.6* 04/22/2014   HGBA1C 9.6* 03/23/2014   HGBA1C 9.1* 12/24/2013     Pt checks his sugars 1a day . Uses ? Old glucometer. By recall they are:  PREMEAL Breakfast Lunch Dinner Bedtime Overall  Glucose range: 230      Mean/median:        POST-MEAL PC Breakfast PC Lunch PC Dinner  Glucose range:     Mean/median:       Hypoglycemia-  No lows. Lowest sugar was n/a; he has hypoglycemia awareness at 70.   Dietary habits- eats three times daily plus 3 snacks through the day- feel hungry throughout the day. Tries to limit carbs, sweetened beverages, sodas, desserts. Recently met with a dietician as well , and was told that he was doing good.  Exercise- exercises at gym 2xweekly , golf Wednesday, walks half hour other days Weight - going up Wt Readings from Last 3 Encounters:  10/21/14 268 lb 12.8 oz (121.927 kg)  08/04/14 259 lb (117.482 kg)  07/30/14 256 lb 4 oz (116.234 kg)    Diabetes Complications-   Nephropathy- No  CKD, last BUN/creatinine-  Lab Results  Component Value Date   BUN 17 07/23/2014   CREATININE 0.98 07/23/2014   Lab Results  Component Value Date   GFR 88.55 09/07/2013   Lab Results  Component Value Date   MICRALBCREAT 37.7* 09/14/2013     Retinopathy- No, Last DEE was in Feb 2016. Cataract surgery left eye yesterday.  Neuropathy- yes numbness and tingling inhis feet. No known neuropathy.  Associated history - Has CAD and other Cards history . Prior stroke. No hypothyroidism. his last TSH was  Lab Results  Component Value Date   TSH 2.740 07/07/2014    Hyperlipidemia-  his last set of lipids were- Currently on Crestor. Tolerating well with some joint aches.  Was previously Vitamin D deficient, now not taking any supplements.  Lab Results  Component Value Date   CHOL 170 07/20/2014   HDL 32* 07/20/2014   LDLCALC 116* 07/20/2014   LDLDIRECT 157.1 09/07/2013   TRIG 108 07/20/2014   CHOLHDL 5.3 07/20/2014    Blood Pressure/HTN- Patient's blood pressure is well controlled today on current regimen that includes ACE-I ( lisinopril).  Pt has FH of DM in parents and siblings. Former smoker- quit 8 months ago  I have reviewed the patient's past medical history, family and social history, surgical history, medications and allergies.  Past Medical History  Diagnosis Date  . Hypertension 2000  . Coronary artery disease     a. CABG 04/2014: (LIMA--> LAD, SVG --> DIAG, SVG--> OM1, SVG--> PDA)  b. early graft failure. s/p successful rotational atherectomy and stenting of the left main and proximal LAD with DES. DES of mid LAD.(07/22/14)  . Hyperlipidemia   . OSA on CPAP   . Type II diabetes mellitus   . CVA (cerebral infarction)   . Prostate cancer    Past Surgical History  Procedure Laterality Date  . Prostate biopsy  04/03/06 & 02/25/07  . High intense focused ultrasound  07/20/06    By Dr. Jacqlyn Larsen  . Cytoscopy prostatic stone o/w nml  06/21/08    Dr. Jacqlyn Larsen  .  Ett myoview  09/13/09    Low risk EF 49%  . Anterior cervical decomp/discectomy fusion  08/25/2012    Procedure: ANTERIOR CERVICAL DECOMPRESSION/DISCECTOMY FUSION 2 LEVELS;  Surgeon: Hosie Spangle, MD;  Location: [place] [facility];  Service: Neurosurgery;  Laterality: N/A;  Cervical five-six Cervical six-seven anterior cervical decompression with fusion and plating and bonegraft  . Intraoperative transesophageal echocardiogram N/A 04/26/2014    Procedure: INTRAOPERATIVE TRANSESOPHAGEAL ECHOCARDIOGRAM;  Surgeon: Ivin Poot, MD;  Location: [place];  Service: Open Heart Surgery;  Laterality: N/A;  . Coronary artery bypass graft N/A 04/26/2014    Procedure: CORONARY ARTERY BYPASS GRAFTING (CABG), on pump, times four, using left internal mammary artery, right greater saphenous vein harvested endoscopically.;  Surgeon: Ivin Poot, MD;  Location: [place];  Service: Open Heart Surgery;  Laterality: N/A;  LIMA to LAD, SVG to DIAGONAL, SVG to OM1, SVG to PDA) with EVH of the RIGHT THIGH and LOWER EXTREMITY SAPHENOUS VEIN  . Back surgery    . Percutaneous coronary rotoblator intervention (pci-r) N/A 07/22/2014    Procedure: PERCUTANEOUS CORONARY ROTOBLATOR INTERVENTION (PCI-R);  Surgeon: Peter M Martinique, MD;  Location: [facility] C[facility];  Service: Cardiovascular;  Laterality: N/A;  . Cardiac catheterization  04/2014; 07/19/2014   Family History  Problem Relation Age of Onset  . Diabetes Mother 7    DM  . Coronary artery disease Mother   . Hypertension Mother   . Heart attack Mother     CABG with MI in 2005  . Heart disease Mother     CV  . Hypertension Father   . Heart attack Father     CABG with Mi around 1997  . Coronary artery disease Father   . Heart disease Father     CV  . Diabetes Father   . Heart attack Brother     MI/PTCA  . Heart disease Brother     CV  . Diabetes Brother   . Heart attack Maternal Grandfather     MI  . Prostate cancer Paternal Grandfather   . Breast cancer Neg Hx      Breast/ovarian/uterine cancer  . Colon cancer Neg Hx   . Depression Neg Hx   . Alcohol abuse Neg Hx     ETOH/drug abuse  . Stroke Neg Hx   . Diabetes Sister     DM   History   Social History  . Marital Status: Married    Spouse Name: N/A  . Number of Children: 3  . Years of Education: N/A   Occupational History  . Insurance account manager    Social History Main Topics  . Smoking status: Former Smoker -- 1.00 packs/day for 36 years  Types: Cigarettes    Quit date: 03/01/2014  . Smokeless tobacco: Never Used  . Alcohol Use: Yes     Comment: 07/19/2014 "might have a drink a couple times/yr"  . Drug Use: No  . Sexual Activity: Not Currently   Other Topics Concern  . Not on file   Social History Narrative   Lives with wife.   2 daughters and 1 son.   [employer] fan   Runs [employer]   Current Outpatient Prescriptions on File Prior to Visit  Medication Sig Dispense Refill  . aspirin EC 325 MG EC tablet Take 1 tablet (325 mg total) by mouth daily. 30 tablet 0  . bicalutamide (CASODEX) 50 MG tablet Take 50 mg by mouth daily.    . clopidogrel (PLAVIX) 75 MG tablet Take 1 tablet (75 mg total) by mouth daily with breakfast. 30 tablet 11  . ferrous sulfate 325 (65 FE) MG tablet Take 1 tablet (325 mg total) by mouth daily with breakfast. For one month then stop.  3  . furosemide (LASIX) 40 MG tablet Take 1 tablet (40 mg total) by mouth daily as needed for fluid or edema. 30 tablet 3  . Insulin Pen Needle 31G X 8 MM MISC Use as directed to inject blood sugar twice daily dx:250.00 100 each 12  . leuprolide (LUPRON) 30 MG injection Inject 45 mg into the muscle every 6 (six) months.    Marland Kitchen lisinopril (PRINIVIL,ZESTRIL) 2.5 MG tablet TAKE ONE TABLET BY MOUTH EVERY DAY 90 tablet 3  . metoprolol tartrate (LOPRESSOR) 25 MG tablet Take 0.5 tablets (12.5 mg total) by mouth 2 (two) times daily. 30 tablet 6  . ONE TOUCH ULTRA TEST test strip USE AS DIRECTED TO TEST  BLOOD SUGARS TWICE DAILY 200 each 3  . potassium chloride SA (K-DUR,KLOR-CON) 20 MEQ tablet Take 1 tablet (20 mEq total) by mouth daily as needed (Take one tabley if you take a Lasix tablet). 30 tablet 3  . rosuvastatin (CRESTOR) 5 MG tablet Take 1 tablet (5 mg total) by mouth daily. 30 tablet 11   No current facility-administered medications on file prior to visit.   Allergies  Allergen Reactions  . Metformin And Related Nausea Only  . Statins     REACTION: JOINT ACHES     Review of Systems: [x]  complains of  [  ] denies General:   [ x ] Recent weight change [  ] Fatigue  [  ] Loss of appetite Eyes: [x  ]  Vision Difficulty [  ]  Eye pain ENT: [  ]  Hearing difficulty [  ]  Difficulty Swallowing CVS: [  ] Chest pain [  ]  Palpitations/Irregular Heart beat [  ]  Shortness of breath lying flat [  ] Swelling of legs Resp: [  ] Frequent Cough [  ] Shortness of Breath  [  ]  Wheezing GI: [  ] Heartburn  [  ] Nausea or Vomiting  [  ] Diarrhea [  ] Constipation  [  ] Abdominal Pain GU: [  ]  Polyuria  [  ]  nocturia Bones/joints:  [ x ]  Muscle aches  [ x ] Joint Pain  [  ] Bone pain Skin/Hair/Nails: [  ]  Rash  [  ] New stretch marks [  ]  Itching [  ] Hair loss [  ]  Excessive hair growth Reproduction: [  ] Low sexual desire , [  ]  Women: Menstrual  cycle problems [  ]  Women: Breast Discharge [ x ] Men: Difficulty with erections [  ]  Men: Enlarged Breasts CNS: [  ] Frequent Headaches [  ] Blurry vision [  ] Tremors [  ] Seizures [  ] Loss of consciousness [  ] Localized weakness Endocrine: [  ]  Excess thirst [  ]  Feeling excessively hot [  ]  Feeling excessively cold Heme: [ x ]  Easy bruising [  ]  Enlarged glands or lumps in neck Allergy: [  ]  Food allergies [  ] Environmental allergies  PE: BP 126/70 mmHg  Pulse 76  Temp(Src) 97.8 F (36.6 C) (Oral)  Resp 16  Ht 5' 9"  (1.753 m)  Wt 268 lb 12.8 oz (121.927 kg)  BMI 39.68 kg/m2  SpO2 98% Wt Readings from Last 3 Encounters:   10/21/14 268 lb 12.8 oz (121.927 kg)  08/04/14 259 lb (117.482 kg)  07/30/14 256 lb 4 oz (116.234 kg)   GENERAL: No acute distress, well developed HEENT:  Eye exam shows normal external appearance. Oral exam shows normal mucosa .  NECK:   Neck exam shows no lymphadenopathy. No Carotids bruits. Thyroid is not enlarged and no nodules felt.  some acanthosis nigricans LUNGS:         Chest is symmetrical. Lungs are clear to auscultation.Marland Kitchen   HEART:         Heart sounds:  S1 and S2 are normal. No murmurs or clicks heard. ABDOMEN:  No Distention present. Liver and spleen are not palpable. No other mass or tenderness present.  EXTREMITIES:     There is min edema. 2+ DP pulses  NEUROLOGICAL:     Grossly intact.            Diabetic foot exam done with shoes and socks removed: Normal Monofilament testing bilaterally. No deformities of toes.  Nails  Not dystrophic. Skin normal color. No open wounds. Dry skin with some scaling left sole.  MUSCULOSKELETAL:       There is no enlargement or gross deformity of the joints.  SKIN:      punctate reddish rash over dorsal aspect of foot  ASSESSMENT AND PLAN: Problem List Items Addressed This Visit      Cardiovascular and Mediastinum   Hypertension    BP at target today on current therapy. Last urine MA abnormal. Will update today. Is on low dose ACE-I.      Relevant Orders   Comprehensive metabolic panel   Hemoglobin A1c   Microalbumin / creatinine urine ratio     Endocrine   Type II diabetes mellitus - Primary    Last A1cs have been above target. Update this visit. Discussed long term risks of uncontrolled DM, foot exams, eye exams reviewed goal sugars and goal A1c.  Reviewed dietary recommendations and exercise.   Would like him to check atleast 2 x daily at alternating times. Given One Touch mini meter.  Discussed increasing fresh vegetable and fruit intake in diet.   Discussed various medication options- meal time insulin, VGO, GLP-1. Hesistant  to try SGLT2 at this stage due to his CVS history and lower EF and him being on diuretics.   He is bothered by his recent weight gain despite good dietary efforts and has elected to start GLP-1 therapy.  Stop Januvia. Start Tanzeum 30 mg weekly. Discussed common side effects and risk profile.  Given his high basal insulin requirements- I will switch him over to concentrated  basal insulin, Toujeo to help lower injection volume and hopefully this will promote better absorption of med. At next visit, plan to introduce mealtime insulin based on his sugars. He is aware to notify me if lows and agreeable to the plan. Stop lantus. Start Toujeo at 150 units daily.   Labs today.       Relevant Medications   Albiglutide (TANZEUM) 30 MG PEN   Insulin Glargine (TOUJEO SOLOSTAR) 300 UNIT/ML SOPN   Other Relevant Orders   Comprehensive metabolic panel   Hemoglobin A1c   Microalbumin / creatinine urine ratio   Vit D  25 hydroxy (rtn osteoporosis monitoring)     Other   Hyperlipidemia    Recent LDL not at target. Taking Crestor and reports some myalgias, joint aches. Update Vitamin D level and replete if low. Explained that due to high CVS risk, would like him to continue this med if symptoms are are tolerable. He is going to continue taking this.       Relevant Orders   Comprehensive metabolic panel   Hemoglobin A1c   Microalbumin / creatinine urine ratio   Vit D  25 hydroxy (rtn osteoporosis monitoring)        - Return to clinic in 1 mo with sugar log/meter.  Aujanae Mccullum [facility] 10/21/2014 11:59 AM

## 2014-10-21 NOTE — Progress Notes (Signed)
Pre visit review using our clinic review tool, if applicable. No additional management support is needed unless otherwise documented below in the visit note. 

## 2014-10-21 NOTE — Assessment & Plan Note (Signed)
BP at target today on current therapy. Last urine MA abnormal. Will update today. Is on low dose ACE-I.

## 2014-10-21 NOTE — Patient Instructions (Signed)
  Stop Lantus.  Start Toujeo at 150 units daily at bedtime.  Stop Januvia.  Start tanzeum at 30 mg once weekly.  Report back if do get nausea, vomiting, abdominal pain while on this medication.   Check sugars 2 x daily.  Bring meter to next appointment.  Increase intake of fresh fruits and veggies.  Please come back for a follow-up appointment in 1 month.

## 2014-10-21 NOTE — Assessment & Plan Note (Signed)
Recent LDL not at target. Taking Crestor and reports some myalgias, joint aches. Update Vitamin D level and replete if low. Explained that due to high CVS risk, would like him to continue this med if symptoms are are tolerable. He is going to continue taking this.

## 2014-10-21 NOTE — Assessment & Plan Note (Signed)
Last A1cs have been above target. Update this visit. Discussed long term risks of uncontrolled DM, foot exams, eye exams reviewed goal sugars and goal A1c.  Reviewed dietary recommendations and exercise.   Would like him to check atleast 2 x daily at alternating times. Given One Touch mini meter.  Discussed increasing fresh vegetable and fruit intake in diet.   Discussed various medication options- meal time insulin, VGO, GLP-1. Hesistant to try SGLT2 at this stage due to his CVS history and lower EF and him being on diuretics.   He is bothered by his recent weight gain despite good dietary efforts and has elected to start GLP-1 therapy.  Stop Januvia. Start Tanzeum 30 mg weekly. Discussed common side effects and risk profile.  Given his high basal insulin requirements- I will switch him over to concentrated basal insulin, Toujeo to help lower injection volume and hopefully this will promote better absorption of med. At next visit, plan to introduce mealtime insulin based on his sugars. He is aware to notify me if lows and agreeable to the plan. Stop lantus. Start Toujeo at 150 units daily.   Labs today.

## 2014-10-22 ENCOUNTER — Other Ambulatory Visit: Payer: Self-pay | Admitting: Endocrinology

## 2014-10-22 ENCOUNTER — Telehealth: Payer: Self-pay

## 2014-10-22 LAB — VITAMIN D 25 HYDROXY (VIT D DEFICIENCY, FRACTURES): VITD: 16.35 ng/mL — ABNORMAL LOW (ref 30.00–100.00)

## 2014-10-22 MED ORDER — INSULIN GLARGINE 300 UNIT/ML ~~LOC~~ SOPN: 150.0000 [IU] | PEN_INJECTOR | Freq: Every day | SUBCUTANEOUS | Status: DC

## 2014-10-22 NOTE — Telephone Encounter (Signed)
Patient called confused about his dose of toujeo. When patient went to the pharmacy he only received 3 pens which would only be a 6 day supply of medication at 150units daily. Dr. Howell Rucks only ordered 4 pens with 3 refills which still wouldn't be enough for a month supply. Patient waited to know how many pens should he receive in a month and if the 150 units is the correct dose. Please advise.

## 2014-10-22 NOTE — Telephone Encounter (Signed)
Yes, he was taking 150 units lantus daily. The equivalent dose of Toujeo is 150 units daily. 1 pen has 450 units in it..I have corrected the qty so that he gets 1 month equivalent ( 10 pens).

## 2014-10-25 ENCOUNTER — Encounter: Payer: Self-pay | Admitting: *Deleted

## 2014-10-25 NOTE — Telephone Encounter (Signed)
Left message for pt to return my call.

## 2014-10-25 NOTE — Telephone Encounter (Signed)
Pt notified and verbalized understanding.

## 2014-10-28 ENCOUNTER — Encounter: Payer: Self-pay | Admitting: Family Medicine

## 2014-11-01 ENCOUNTER — Ambulatory Visit (INDEPENDENT_AMBULATORY_CARE_PROVIDER_SITE_OTHER): Payer: BLUE CROSS/BLUE SHIELD | Admitting: Cardiovascular Disease

## 2014-11-01 ENCOUNTER — Encounter: Payer: Self-pay | Admitting: Cardiovascular Disease

## 2014-11-01 VITALS — BP 133/69 | HR 61 | Ht 69.0 in | Wt 264.5 lb

## 2014-11-01 DIAGNOSIS — I10 Essential (primary) hypertension: Secondary | ICD-10-CM

## 2014-11-01 DIAGNOSIS — R079 Chest pain, unspecified: Secondary | ICD-10-CM

## 2014-11-01 DIAGNOSIS — E785 Hyperlipidemia, unspecified: Secondary | ICD-10-CM

## 2014-11-01 DIAGNOSIS — I2510 Atherosclerotic heart disease of native coronary artery without angina pectoris: Secondary | ICD-10-CM

## 2014-11-01 MED ORDER — CARVEDILOL 3.125 MG PO TABS
3.1250 mg | ORAL_TABLET | Freq: Two times a day (BID) | ORAL | Status: DC
Start: 2014-11-01 — End: 2015-06-23

## 2014-11-01 MED ORDER — LOSARTAN POTASSIUM 25 MG PO TABS: 12.5000 mg | ORAL_TABLET | Freq: Every day | ORAL | Status: DC

## 2014-11-01 MED ORDER — METOPROLOL TARTRATE 25 MG PO TABS: 12.5000 mg | ORAL_TABLET | Freq: Every day | ORAL | Status: DC

## 2014-11-01 MED ORDER — ASPIRIN EC 81 MG PO TBEC: 81.0000 mg | DELAYED_RELEASE_TABLET | Freq: Every day | ORAL | Status: AC

## 2014-11-01 NOTE — Patient Instructions (Addendum)
Decrease Aspirin to 81 mg once daily.  Stop taking Metoprolol and Lisinopril.   Start Carvedilol (Coreg) 3.125 mg twice daily.  Start Losartan 12.5 mg (1/2 tablet of 25 mg ) once daily.   Your physician wants you to follow-up in: 6 months.  You will receive a reminder letter in the mail two months in advance. If you don't receive a letter, please call our office to schedule the follow-up appointment.

## 2014-11-01 NOTE — Progress Notes (Signed)
Primary care physician: Dr. Damita Dunnings  HPI  This is a pleasant 64 year old man who is here today for followup visit regarding coronary artery disease and recent CABG.  He has known history of diabetes for at least 25 years of duration which has not been optimally controlled. He also has other medical conditions that include  Hyperlipidemia, tobacco use, obesity and prostate cancer. He was seen in September, 2015 for new onset progressive angina . I proceeded with cardiac catheterization which showed severe three-vessel and moderate left main disease. Ejection fraction was 45%. He [facility] with LIMA to LAD, SVG to DIAGONAL, SVG to OM1, SVG to PDA. He had postoperative atrial fibrillation which was treated with amiodarone.  He developed symptoms of exertional dyspnea and left arm discomfort in November with some evidence of fluid overload. He was started on furosemide with slight improvement. He underwent a nuclear stress test which was abnormal. He had inferolateral ST elevation with exercise. Nuclear imaging showed evidence of inferior as well as anterior ischemia. I proceeded with cardiac catheterization which showed occluded grafts except for SVG to OM 2. LIMA to LAD was atretic. Ejection fraction was 55%. He was transferred to [facility] He was seen by Dr. Prescott Gum and was deemed to be not a candidate for redo CABG due to poor targets. He underwent high-risk atherectomy of the left main and LAD with stenting of the mid and ostial LAD by Dr. Martinique. He has been doing well since hospital discharge and denies any recurrent arm discomfort or dyspnea. He attended cardiac rehabilitation. He complains of dry cough since he has been on lisinopril.     Allergies  Allergen Reactions  . Metformin And Related Nausea Only  . Statins     REACTION: JOINT ACHES     Current Outpatient Prescriptions on File Prior to Visit  Medication Sig Dispense Refill  . Albiglutide (TANZEUM) 30 MG PEN Inject 30 mg  into the skin once a week. 4 each 3  . aspirin EC 325 MG EC tablet Take 1 tablet (325 mg total) by mouth daily. 30 tablet 0  . bicalutamide (CASODEX) 50 MG tablet Take 50 mg by mouth daily.    . clopidogrel (PLAVIX) 75 MG tablet Take 1 tablet (75 mg total) by mouth daily with breakfast. 30 tablet 11  . ferrous sulfate 325 (65 FE) MG tablet Take 1 tablet (325 mg total) by mouth daily with breakfast. For one month then stop.  3  . furosemide (LASIX) 40 MG tablet Take 1 tablet (40 mg total) by mouth daily as needed for fluid or edema. 30 tablet 3  . Insulin Glargine (TOUJEO SOLOSTAR) 300 UNIT/ML SOPN Inject 150 Units into the skin at bedtime. 10 pen 3  . Insulin Pen Needle 31G X 8 MM MISC Use as directed to inject blood sugar twice daily dx:250.00 100 each 12  . leuprolide (LUPRON) 30 MG injection Inject 45 mg into the muscle every 6 (six) months.    Marland Kitchen lisinopril (PRINIVIL,ZESTRIL) 2.5 MG tablet TAKE ONE TABLET BY MOUTH EVERY DAY 90 tablet 3  . metoprolol tartrate (LOPRESSOR) 25 MG tablet Take 0.5 tablets (12.5 mg total) by mouth 2 (two) times daily. 30 tablet 6  . ONE TOUCH ULTRA TEST test strip USE AS DIRECTED TO TEST BLOOD SUGARS TWICE DAILY 200 each 3  . potassium chloride SA (K-DUR,KLOR-CON) 20 MEQ tablet Take 1 tablet (20 mEq total) by mouth daily as needed (Take one tabley if you take a Lasix tablet).  30 tablet 3  . rosuvastatin (CRESTOR) 5 MG tablet Take 1 tablet (5 mg total) by mouth daily. 30 tablet 11   No current facility-administered medications on file prior to visit.     Past Medical History  Diagnosis Date  . Hypertension 2000  . Coronary artery disease     a. CABG 04/2014: (LIMA--> LAD, SVG --> DIAG, SVG--> OM1, SVG--> PDA)  b. early graft failure. s/p successful rotational atherectomy and stenting of the left main and proximal LAD with DES. DES of mid LAD.(07/22/14)  . Hyperlipidemia   . OSA on CPAP   . Type II diabetes mellitus   . CVA (cerebral infarction)   . Prostate  cancer      Past Surgical History  Procedure Laterality Date  . Prostate biopsy  04/03/06 & 02/25/07  . High intense focused ultrasound  07/20/06    By Dr. Jacqlyn Larsen  . Cytoscopy prostatic stone o/w nml  06/21/08    Dr. Jacqlyn Larsen  . Ett myoview  09/13/09    Low risk EF 49%  . Anterior cervical decomp/discectomy fusion  08/25/2012    Procedure: ANTERIOR CERVICAL DECOMPRESSION/DISCECTOMY FUSION 2 LEVELS;  Surgeon: Hosie Spangle, MD;  Location: [place] [facility];  Service: Neurosurgery;  Laterality: N/A;  Cervical five-six Cervical six-seven anterior cervical decompression with fusion and plating and bonegraft  . Intraoperative transesophageal echocardiogram N/A 04/26/2014    Procedure: INTRAOPERATIVE TRANSESOPHAGEAL ECHOCARDIOGRAM;  Surgeon: Ivin Poot, MD;  Location: [place];  Service: Open Heart Surgery;  Laterality: N/A;  . Coronary artery bypass graft N/A 04/26/2014    Procedure: CORONARY ARTERY BYPASS GRAFTING (CABG), on pump, times four, using left internal mammary artery, right greater saphenous vein harvested endoscopically.;  Surgeon: Ivin Poot, MD;  Location: [place];  Service: Open Heart Surgery;  Laterality: N/A;  LIMA to LAD, SVG to DIAGONAL, SVG to OM1, SVG to PDA) with EVH of the RIGHT THIGH and LOWER EXTREMITY SAPHENOUS VEIN  . Back surgery    . Percutaneous coronary rotoblator intervention (pci-r) N/A 07/22/2014    Procedure: PERCUTANEOUS CORONARY ROTOBLATOR INTERVENTION (PCI-R);  Surgeon: Peter M Martinique, MD;  Location: [facility] CATH LAB;  Service: Cardiovascular;  Laterality: N/A;  . Cardiac catheterization  04/2014; 07/19/2014     Family History  Problem Relation Age of Onset  . Diabetes Mother 25    DM  . Coronary artery disease Mother   . Hypertension Mother   . Heart attack Mother     CABG with MI in 2005  . Heart disease Mother     CV  . Hypertension Father   . Heart attack Father     CABG with Mi around 1997  . Coronary artery disease Father   . Heart disease Father     CV    . Diabetes Father   . Heart attack Brother     MI/PTCA  . Heart disease Brother     CV  . Diabetes Brother   . Heart attack Maternal Grandfather     MI  . Prostate cancer Paternal Grandfather   . Breast cancer Neg Hx     Breast/ovarian/uterine cancer  . Colon cancer Neg Hx   . Depression Neg Hx   . Alcohol abuse Neg Hx     ETOH/drug abuse  . Stroke Neg Hx   . Diabetes Sister     DM     History   Social History  . Marital Status: Married    Spouse Name: N/A  .  Number of Children: 3  . Years of Education: N/A   Occupational History  . Insurance account manager    Social History Main Topics  . Smoking status: Former Smoker -- 1.00 packs/day for 36 years    Types: Cigarettes    Quit date: 03/01/2014  . Smokeless tobacco: Never Used  . Alcohol Use: Yes     Comment: 07/19/2014 "might have a drink a couple times/yr"  . Drug Use: No  . Sexual Activity: Not Currently   Other Topics Concern  . Not on file   Social History Narrative   Lives with wife.   2 daughters and 1 son.   [employer] fan   Runs [employer]     ROS A 10 point review of system was performed. It is negative other than that mentioned in the history of present illness.   PHYSICAL EXAM   BP 133/69 mmHg  Pulse 61  Ht 5\' 9"  (1.753 m)  Wt 264 lb 8 oz (119.976 kg)  BMI 39.04 kg/m2 Constitutional: He is oriented to person, place, and time. He appears well-developed and well-nourished. No distress.  HENT: No nasal discharge.  Head: Normocephalic and atraumatic.  Eyes: Pupils are equal and round.  No discharge. Neck: Normal range of motion. Neck supple. No JVD present. No thyromegaly present.  Cardiovascular: Normal rate, regular rhythm, normal heart sounds. Exam reveals no gallop and no friction rub. No murmur heard.  Pulmonary/Chest: Effort normal and breath sounds normal. No stridor. No respiratory distress. He has no wheezes. He has no rales. He exhibits no tenderness.   Abdominal: Soft. Bowel sounds are normal. He exhibits no distension. There is no tenderness. There is no rebound and no guarding.  Musculoskeletal: Normal range of motion. He exhibits no edema and no tenderness.  Neurological: He is alert and oriented to person, place, and time. Coordination normal.  Skin: Skin is warm and dry. No rash noted. He is not diaphoretic. No erythema. No pallor.  Psychiatric: He has a normal mood and affect. His behavior is normal. Judgment and thought content normal.         ASSESSMENT AND PLAN

## 2014-11-01 NOTE — Assessment & Plan Note (Signed)
Given that he is diabetic, I switched him from metoprolol to carvedilol. He is having dry cough with lisinopril. Switched to losartan 12.5 mg once daily.

## 2014-11-01 NOTE — Assessment & Plan Note (Signed)
He is doing very well with no symptoms suggestive of angina. Continue medical therapy. I advised him to start exercising on his own. I decreased the dose of aspirin to 81 mg once daily given that he is on Plavix.

## 2014-11-01 NOTE — Assessment & Plan Note (Signed)
Most recent lipid profile 3 months ago showed significant improvement in LDL on small dose rosuvastatin. However, it was still above 100. I discussed with him that ideally LDL should be less than 70. He does not think he can tolerate higher doses of rosuvastatin or any other statins due to significant myalgia in the past. I made no changes today. However, I advised him to increase his physical activities and try to lose some weight.

## 2014-11-01 NOTE — Addendum Note (Signed)
Addended by: Juanda Crumble R on: 11/01/2014 10:06 AM   Modules accepted: Orders

## 2014-11-25 ENCOUNTER — Ambulatory Visit (INDEPENDENT_AMBULATORY_CARE_PROVIDER_SITE_OTHER): Payer: BLUE CROSS/BLUE SHIELD | Admitting: Endocrinology

## 2014-11-25 ENCOUNTER — Encounter: Payer: Self-pay | Admitting: Endocrinology

## 2014-11-25 VITALS — BP 124/72 | HR 66 | Resp 14 | Ht 69.0 in | Wt 267.2 lb

## 2014-11-25 DIAGNOSIS — E669 Obesity, unspecified: Secondary | ICD-10-CM

## 2014-11-25 DIAGNOSIS — E119 Type 2 diabetes mellitus without complications: Secondary | ICD-10-CM | POA: Diagnosis not present

## 2014-11-25 DIAGNOSIS — I10 Essential (primary) hypertension: Secondary | ICD-10-CM | POA: Diagnosis not present

## 2014-11-25 MED ORDER — INSULIN ASPART 100 UNIT/ML FLEXPEN
PEN_INJECTOR | SUBCUTANEOUS | Status: DC
Start: 2014-11-25 — End: 2015-06-10

## 2014-11-25 MED ORDER — INSULIN GLARGINE 300 UNIT/ML ~~LOC~~ SOPN: 80.0000 [IU] | PEN_INJECTOR | Freq: Every day | SUBCUTANEOUS | Status: DC

## 2014-11-25 NOTE — Assessment & Plan Note (Signed)
BP at target today on current therapy. Last urine MA normal March 2016.  Is on low dose ACE-I.

## 2014-11-25 NOTE — Assessment & Plan Note (Signed)
Encouraged to follow diet/exercise modifications. Hopefully with start of GLP-1, he will lose some weight.

## 2014-11-25 NOTE — Patient Instructions (Signed)
Check sugars 4 x daily ( before each meal and at bedtime).  Record them in a log book and bring that/meter to next appointment.   Change Touejo to 80 units at night time.  Follow Novolog chart as below Give Novolog at start of meal or immediately after. Base amount on premeal sugars.  If you skip a meal, then skip that shot of insulin.  Soace meals 4-5 hours apart. Continue current tanzeum   Blood Glucose (mg/dL)  Breakfast  (Units Novolog Insulin)  Lunch  (Units Novolog Insulin)  Supper  (Units Novolog Insulin)  Nighttime (Units Novolog Insulin)   <70 Treat the low blood sugar.  Recheck blood glucose  in 15 mins. If sugar is more than 70, then take the number of units of insulin in the 70-90 row, if before a meal.   70-90  15  15  15   0  91-130 (Base Dose)  25  25  25   0   131-150  26  26  26   0   151-200  27  27  27   0   201-250  28  28  28  0   251-300  29  29  29 1    301-350  30  30  30 2    351-400  31  31  31  3    401-450  32  32  32 4   >450  33  33  33  5   Toujeo insulin 80 units subcutaneous Nightly     Please come back for a follow-up appointment in 1 month.

## 2014-11-25 NOTE — Progress Notes (Signed)
Pre visit review using our clinic review tool, if applicable. No additional management support is needed unless otherwise documented below in the visit note. 

## 2014-11-25 NOTE — Progress Notes (Signed)
Reason for visit-  Phillip LOESCHER Sr. is a 64 y.o.-year-old male, for management of Type 2 diabetes, uncontrolled, without complications. Associated hx CVA, CAD s/p ACS s/p CABG Sept 2015, post op Afib Last  Visit March 2016.   HPI- Patient has been diagnosed with diabetes in 1996. Recalls being initially on lifestyle modifications.  Tried  Metformin, Glipizide?,  Januvia, Victoza. he has  been on insulin since  2008  .Tried levemir, regular  and Humalog in the past  *was previously on Novolog as well 2015-was tough with his routine. Sales job, traveling in the day. Checking sugars also a problem.  * Didn't tolerate  Metformin due to GI upset.Unsure whether other forms tried. Last used 10 years ago. *januvia stopped March 2016 and changed to Tanzeum *Lantus changed to [facility] March 2016   Pt is currently on a regimen of:  - Toujeo 150 units qhs  -Tanzeum 30 mg [place] weekly ( march 2016)  *tolerating well, does not yet note any appetite reduction with GLP-1  Last hemoglobin A1c was: Lab Results  Component Value Date   HGBA1C 9.1* 10/21/2014   HGBA1C 9.6* 04/22/2014   HGBA1C 9.6* 03/23/2014     Pt checks his sugars 1a day . Uses One Touch glucometer. By download they are:  PREMEAL Breakfast Lunch Dinner Bedtime Overall  Glucose range: 120-307      Mean/median:        POST-MEAL PC Breakfast PC Lunch PC Dinner  Glucose range:     Mean/median:       Hypoglycemia-  No lows. Lowest sugar was n/a; he has hypoglycemia awareness at 70.   Dietary habits- eats three times daily plus 3 snacks through the day- feel hungry throughout the day. Tries to limit carbs, sweetened beverages, sodas, desserts. Recently met with a dietician as well , and was told that he was doing good.  Exercise- exercises at gym 2xweekly , golf Wednesday, walks half hour other days- staying busy Weight - going up Wt Readings from Last 3 Encounters:  11/25/14 267 lb 4 oz (121.224 kg)  11/01/14 264 lb 8 oz  (119.976 kg)  10/21/14 268 lb 12.8 oz (121.927 kg)    Diabetes Complications-  Nephropathy- No  CKD, last BUN/creatinine-  Lab Results  Component Value Date   BUN 17 10/21/2014   CREATININE 0.88 10/21/2014   Lab Results  Component Value Date   GFR 92.87 10/21/2014   Lab Results  Component Value Date   MICRALBCREAT 13.4 10/21/2014     Retinopathy- No, Last DEE was in Feb 2016. Cataract surgery left eye March 2016.  Neuropathy- yes numbness and tingling inhis feet. No known neuropathy.  Associated history - Has CAD and other Cards history . Prior stroke. No hypothyroidism. his last TSH was  Lab Results  Component Value Date   TSH 2.740 07/07/2014    Hyperlipidemia-  his last set of lipids were- Currently on Crestor. Tolerating well with some joint aches.  Was previously Vitamin D deficient, now not taking any supplements.  Lab Results  Component Value Date   CHOL 170 07/20/2014   HDL 32* 07/20/2014   LDLCALC 116* 07/20/2014   LDLDIRECT 157.1 09/07/2013   TRIG 108 07/20/2014   CHOLHDL 5.3 07/20/2014    Blood Pressure/HTN- Patient's blood pressure is well controlled today on current regimen that includes ACE-I ( lisinopril).  Former smoker- quit 8 months ago  I have reviewed the patient's past medical history, family and social history, surgical history, medications  and allergies.  Past Medical History  Diagnosis Date  . Hypertension 2000  . Coronary artery disease     a. CABG 04/2014: (LIMA--> LAD, SVG --> DIAG, SVG--> OM1, SVG--> PDA)  b. early graft failure. s/p successful rotational atherectomy and stenting of the left main and proximal LAD with DES. DES of mid LAD.(07/22/14)  . Hyperlipidemia   . OSA on CPAP   . Type II diabetes mellitus   . CVA (cerebral infarction)   . Prostate cancer    Past Surgical History  Procedure Laterality Date  . Prostate biopsy  04/03/06 & 02/25/07  . High intense focused ultrasound  07/20/06    By Dr. Jacqlyn Larsen  . Cytoscopy prostatic  stone o/w nml  06/21/08    Dr. Jacqlyn Larsen  . Ett myoview  09/13/09    Low risk EF 49%  . Anterior cervical decomp/discectomy fusion  08/25/2012    Procedure: ANTERIOR CERVICAL DECOMPRESSION/DISCECTOMY FUSION 2 LEVELS;  Surgeon: Hosie Spangle, MD;  Location: [place] [facility];  Service: Neurosurgery;  Laterality: N/A;  Cervical five-six Cervical six-seven anterior cervical decompression with fusion and plating and bonegraft  . Intraoperative transesophageal echocardiogram N/A 04/26/2014    Procedure: INTRAOPERATIVE TRANSESOPHAGEAL ECHOCARDIOGRAM;  Surgeon: Ivin Poot, MD;  Location: [place];  Service: Open Heart Surgery;  Laterality: N/A;  . Coronary artery bypass graft N/A 04/26/2014    Procedure: CORONARY ARTERY BYPASS GRAFTING (CABG), on pump, times four, using left internal mammary artery, right greater saphenous vein harvested endoscopically.;  Surgeon: Ivin Poot, MD;  Location: [place];  Service: Open Heart Surgery;  Laterality: N/A;  LIMA to LAD, SVG to DIAGONAL, SVG to OM1, SVG to PDA) with EVH of the RIGHT THIGH and LOWER EXTREMITY SAPHENOUS VEIN  . Back surgery    . Percutaneous coronary rotoblator intervention (pci-r) N/A 07/22/2014    Procedure: PERCUTANEOUS CORONARY ROTOBLATOR INTERVENTION (PCI-R);  Surgeon: Peter M Martinique, MD;  Location: [facility] CATH LAB;  Service: Cardiovascular;  Laterality: N/A;  . Cardiac catheterization  04/2014; 07/19/2014   Family History  Problem Relation Age of Onset  . Diabetes Mother 38    DM  . Coronary artery disease Mother   . Hypertension Mother   . Heart attack Mother     CABG with MI in 2005  . Heart disease Mother     CV  . Hypertension Father   . Heart attack Father     CABG with Mi around 1997  . Coronary artery disease Father   . Heart disease Father     CV  . Diabetes Father   . Heart attack Brother     MI/PTCA  . Heart disease Brother     CV  . Diabetes Brother   . Heart attack Maternal Grandfather     MI  . Prostate cancer Paternal  Grandfather   . Breast cancer Neg Hx     Breast/ovarian/uterine cancer  . Colon cancer Neg Hx   . Depression Neg Hx   . Alcohol abuse Neg Hx     ETOH/drug abuse  . Stroke Neg Hx   . Diabetes Sister     DM   History   Social History  . Marital Status: Married    Spouse Name: N/A  . Number of Children: 3  . Years of Education: N/A   Occupational History  . Insurance account manager    Social History Main Topics  . Smoking status: Former Smoker -- 1.00 packs/day for 36  years    Types: Cigarettes    Quit date: 03/01/2014  . Smokeless tobacco: Never Used  . Alcohol Use: Yes     Comment: 07/19/2014 "might have a drink a couple times/yr"  . Drug Use: No  . Sexual Activity: Not Currently   Other Topics Concern  . Not on file   Social History Narrative   Lives with wife.   2 daughters and 1 son.   [employer] fan   Runs [employer]   Current Outpatient Prescriptions on File Prior to Visit  Medication Sig Dispense Refill  . Albiglutide (TANZEUM) 30 MG PEN Inject 30 mg into the skin once a week. 4 each 3  . aspirin EC 81 MG tablet Take 1 tablet (81 mg total) by mouth daily. 90 tablet 3  . bicalutamide (CASODEX) 50 MG tablet Take 50 mg by mouth daily.    . carvedilol (COREG) 3.125 MG tablet Take 1 tablet (3.125 mg total) by mouth 2 (two) times daily with a meal. 60 tablet 6  . clopidogrel (PLAVIX) 75 MG tablet Take 1 tablet (75 mg total) by mouth daily with breakfast. 30 tablet 11  . ferrous sulfate 325 (65 FE) MG tablet Take 1 tablet (325 mg total) by mouth daily with breakfast. For one month then stop.  3  . furosemide (LASIX) 40 MG tablet Take 1 tablet (40 mg total) by mouth daily as needed for fluid or edema. 30 tablet 3  . Insulin Pen Needle 31G X 8 MM MISC Use as directed to inject blood sugar twice daily dx:250.00 100 each 12  . leuprolide (LUPRON) 30 MG injection Inject 45 mg into the muscle every 6 (six) months.    Marland Kitchen losartan (COZAAR) 25 MG tablet  Take 0.5 tablets (12.5 mg total) by mouth daily. 30 tablet 6  . ONE TOUCH ULTRA TEST test strip USE AS DIRECTED TO TEST BLOOD SUGARS TWICE DAILY 200 each 3  . potassium chloride SA (K-DUR,KLOR-CON) 20 MEQ tablet Take 1 tablet (20 mEq total) by mouth daily as needed (Take one tabley if you take a Lasix tablet). 30 tablet 3  . rosuvastatin (CRESTOR) 5 MG tablet Take 1 tablet (5 mg total) by mouth daily. 30 tablet 11   No current facility-administered medications on file prior to visit.   Allergies  Allergen Reactions  . Metformin And Related Nausea Only  . Statins     REACTION: JOINT ACHES   Review of Systems- [ x ]  Complains of    [  ]  denies [  ] Recent weight change [  ]  Fatigue [  ] polydipsia [  ] polyuria [  ]  nocturia [  ]  vision difficulty [  ] chest pain [  ] shortness of breath [  ] leg swelling [  ] cough [  ] nausea/vomiting [  ] diarrhea [  ] constipation [  ] abdominal pain [  ]  tingling/numbness in extremities [  ]  concern with feet ( wounds/sores)   PE: BP 124/72 mmHg  Pulse 66  Resp 14  Ht 5' 9"  (1.753 m)  Wt 267 lb 4 oz (121.224 kg)  BMI 39.45 kg/m2  SpO2 97% Wt Readings from Last 3 Encounters:  11/25/14 267 lb 4 oz (121.224 kg)  11/01/14 264 lb 8 oz (119.976 kg)  10/21/14 268 lb 12.8 oz (121.927 kg)   Exam: deferred  ASSESSMENT AND PLAN: Problem List Items Addressed This Visit  Cardiovascular and Mediastinum   Hypertension    BP at target today on current therapy. Last urine MA normal March 2016.  Is on low dose ACE-I.          Endocrine   Type II diabetes mellitus - Primary    Last A1c above target. Recent sugars are being recorded in the morning, and I talked to him about checking them atleast 3 x daily.  At last visit, discussed increasing fresh vegetable and fruit intake in diet.   Discussed various medication options again to improve sugars during the day- meal time insulin, VGO,  Hesistant to try SGLT2 at this stage due to his CVS  history and lower EF and him being on diuretics.    Continue Tanzeum 30 mg weekly. May see appetite reduction over the next few weeks. He is agreeable to try mealtime insulin- will start Novolog at 25+1 scale with each meal.  Change Toujeo to 80 units daily at bedtime.  Report back if starts to notice lows as the GLP-1 agent starts to act better.          Relevant Medications   insulin aspart (NOVOLOG FLEXPEN) 100 UNIT/ML FlexPen   Insulin Glargine (TOUJEO SOLOSTAR) 300 UNIT/ML SOPN     Other   Obesity    Encouraged to follow diet/exercise modifications. Hopefully with start of GLP-1, he will lose some weight.      Relevant Medications   insulin aspart (NOVOLOG FLEXPEN) 100 UNIT/ML FlexPen   Insulin Glargine (TOUJEO SOLOSTAR) 300 UNIT/ML SOPN        - Return to clinic in 1 mo with sugar log/meter.  Davonte Siebenaler [facility] 11/25/2014 2:45 PM

## 2014-11-25 NOTE — Assessment & Plan Note (Signed)
Last A1c above target. Recent sugars are being recorded in the morning, and I talked to him about checking them atleast 3 x daily.  At last visit, discussed increasing fresh vegetable and fruit intake in diet.   Discussed various medication options again to improve sugars during the day- meal time insulin, VGO,  Hesistant to try SGLT2 at this stage due to his CVS history and lower EF and him being on diuretics.    Continue Tanzeum 30 mg weekly. May see appetite reduction over the next few weeks. He is agreeable to try mealtime insulin- will start Novolog at 25+1 scale with each meal.  Change Toujeo to 80 units daily at bedtime.  Report back if starts to notice lows as the GLP-1 agent starts to act better.

## 2014-12-04 NOTE — Consult Note (Signed)
General Aspect Patient ID: Phillip Mimes Sr. MRN: [id number], DOB/AGE: 64-Sep-1952   Primary Physician: Elsie Stain, MD Primary Cardiologist: Jerilynn Mages. Fletcher Anon, MD   Pt. Profile  64 y/o male with recent CABG who presented for stress test today and was found to have ST elevation.  Past Medical History  ??? Diabetes mellitus 2000   Type II ??? Hypertension 2000 ??? Environmental allergies  ??? Pneumonia    x3 as an adult-  last time 2009 ??? Arthritis  ??? History of chicken pox  ??? Syncope and collapse 04/11/14 ??? Stroke    H/o pontine CVA, prev eval by Dr. Erling Cruz ??? Shortness of breath    sometimes, with exertion ??? Prostate cancer    metastatic to neck ??? Coronary artery disease September of 2015   New onset angina. Cardiac catheterization showed severe three-vessel and moderate left main disease. Ejection fraction was 45%. He underwent [facility] with LIMA to LAD, SVG to DIAGONAL, SVG to OM1, SVG to PDA ??? Hyperlipidemia    Past Surgical History  ??? Prostate biopsy  04/03/06 & 02/25/07   x2 ??? High intense focused ultrasound  07/20/06   By Dr. Jacqlyn Larsen ??? Cytoscopy prostatic stone o/w nml  06/21/08   Dr. Jacqlyn Larsen ??? Ett myoview  09/13/09   Low risk EF 49% ??? Anterior cervical decomp/discectomy fusion  08/25/2012   Procedure: ANTERIOR CERVICAL DECOMPRESSION/DISCECTOMY FUSION 2 LEVELS;  Surgeon: Hosie Spangle, MD;  Location: [facility];  Service: Neurosurgery;  Laterality: N/A;  Cervical five-six Cervical six-seven anterior cervical decompression with fusion and plating and bonegraft ??? Intraoperative transesophageal echocardiogram N/A 04/26/2014   Procedure: INTRAOPERATIVE TRANSESOPHAGEAL ECHOCARDIOGRAM;  Surgeon: Ivin Poot, MD;  Location: [facility];  Service: Open Heart Surgery;  Laterality: N/A; ??? Coronary artery bypass graft N/A 04/26/2014   Procedure: CORONARY ARTERY BYPASS GRAFTING (CABG), on pump, times four, using left internal mammary artery, right greater  saphenous vein harvested endoscopically.;  Surgeon: Ivin Poot, MD;  Location: [facility];  Service: Open Heart Surgery;  Laterality: N/A;  LIMA to LAD, SVG to DIAGONAL, SVG to OM1, SVG to PDA) with EVH of the RIGHT THIGH and LOWER EXTREMITY SAPHENOUS VEIN ??? Cardiac catheterization    _____________  Allergies  Allergies Allergen Reactions ??? Metformin And Related Nausea Only ??? Statins    REACTION: JOINT ACHES _________________   Present Illness 64 y/o male with the above complex problem list.  He is recently s/p CABG in 04/2014.  He had been doing well @ cardiac rehab but recently began to not exertional fatigue, dyspnea, and left arm pain.  Through numerous phone conversations with our office, decision was made to pursue stress testing.  This was performed today.  During exercise he developed 2-3 mm inferior ST elevation and 3-5 mm ST elevation in leads V3-V5.  He denied chest or arm pain.  He did have dyspnea and lower extremity claudication during exercise.  ST changes resolved by 6 mins in recovery.  At no point did he have c/p or arm pain.  ECG's reviewed by Dr. Rockey Situ and discussed with Dr. Fletcher Anon.  Admit today for cath. _____________  Family History  ??? Diabetes Mother 64   DM ??? Coronary artery disease Mother  ??? Hypertension Mother  ??? Heart attack Mother    CABG with MI in 2005 ??? Heart disease Mother    CV ??? Hypertension Father  ??? Heart attack Father    CABG with Mi around 1997 ??? Coronary artery disease  Father  ??? Heart disease Father    CV ??? Heart attack Brother    MI/PTCA ??? Heart disease Brother    CV ??? Heart attack Maternal Grandfather    MI ??? Prostate cancer Paternal Grandfather  ??? Breast cancer Neg Hx    Breast/ovarian/uterine cancer ??? Colon cancer Neg Hx  ??? Depression Neg Hx  ??? Alcohol abuse Neg Hx    ETOH/drug abuse ??? Stroke Neg Hx  ??? Diabetes Sister    DM _____________  Social History ??? Marital  Status: Married   Spouse Name: N/A   Number of Children: 3 ??? 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 Smoking status: Former Smoker -- 1.00 packs/day for 36 years ??? Smokeless tobacco: Former Systems developer   Quit date: 03/01/2014    Comment: quit 03/2014 ??? Alcohol Use: Yes    Comment: occassionally ??? Drug Use: No ??? Sexual Activity: Not on file  Social History Narrative  Lives with wife.  2 daughters and 1 son.  [employer] fan  Runs [employer] _____________  Review of Systems General:  No chills, fever, night sweats or weight changes.  Cardiovascular:  +++ chest and left arm pain, +++ dyspnea on exertion, +++ bilat le claudication, no edema, orthopnea, palpitations, paroxysmal nocturnal dyspnea. Dermatological: No rash, lesions/masses Respiratory: No cough, dyspnea Urologic: No hematuria, dysuria Abdominal:   No nausea, vomiting, diarrhea, bright red blood per rectum, melena, or hematemesis Neurologic:  No visual changes, wkns, changes in mental status. All other systems reviewed and are otherwise negative except as noted above. _____________  Physical Exam  General: Pleasant, NAD Psych: Normal affect. Neuro: Alert and oriented X 3. Moves all extremities spontaneously. HEENT: Normal  Neck: Supple without bruits or JVD. Lungs:  Resp regular and unlabored, CTA. Heart: RRR no s3, s4, or murmurs. Abdomen: Soft, non-tender, non-distended, BS + x 4.  Extremities: No clubbing, cyanosis or edema. DP/PT/Radials 2+ and equal bilaterally. _____________   Home Medications: Medication Instructions Status  Lantus 100 units/mL subcutaneous solution 80 unit(s) subcutaneous once a day (at bedtime) Active  aspirin 325 mg oral tablet 1 tab(s) orally once a day Active  bicalutamide 50 mg oral tablet 1 tab(s) orally every 24 hours Active  ferrous sulfate 325 mg oral delayed release tablet 1  tab(s) orally once a day Active  oxyCODONE 5 mg oral tablet 1 tab(s) orally every 4 hours, As Needed - for Pain Active  furosemide 40 mg oral tablet 1 tab(s) orally once a day Active  potassium chloride 20 mEq oral tablet, extended release 1 tab(s) orally once a day Active  leuprolide 45 mg/6 months intramuscular powder for injection, extended release 1 application intramuscular every 6 months due for next dose sept 9th Active  lisinopril 2.5 mg oral tablet 1 tab(s) orally once a day Active  Januvia 50 mg oral tablet 1 tab(s) orally once a day (at bedtime) Active   EKG:  Interpretation During exercise and early recovery - sinus with 2-3 mm inf st elevation and 3-5 mm ST elev in V3-V5 with mild ST dep in I and aVL.  ST changes resolved in recovery.    Impression 1.  [place] with evidence of ST elevation on exercise:  As above, pt developed inflat ST elev with exercise during stress test today.  He c/o dyspnea but denied chest or left arm pain (anginal equivalent).  Dyspnea and ST changes resolved by 6 mins in recovery.  Hemodynamically stable throughout.  Reviewed with Drs. Gollan and Arida.  Plan for admission, heparinization, and catheterization this afternoon.  2.  HTN:  Cont home meds.  Follow.  3.  HL:  Cont statin.  4.  DM:  Cont januvia/lantus.  Add SSI.  5.  Morbid Obesity:  He has been partaking in cardiac rehab.  Resume pending cath.   Electronic Signatures for Addendum Section:  [name] (MD) (Signed Addendum 07-Dec-15 13:04)  The patient was seen and examined. Agree with the above. symptoms worrisome for unstable angina. He had anteroalteral ST elevation during stress test with a reversible defect in inferior and anterolateral region. Recommend urgent cardiac cath today.   Electronic Signatures: [name] (MD)  (Signed 07-Dec-15 13:04)  Co-Signer: General Aspect/Present Illness, Home Medications, EKG , Allergies, Impression/Plan Rogelia Mire (NP)  (Signed  07-Dec-15 10:19)  Authored: General Aspect/Present Illness, Home Medications, EKG , Allergies, Impression/Plan   Last Updated: 07-Dec-15 13:04 by [name] (MD)

## 2014-12-08 ENCOUNTER — Ambulatory Visit: Payer: BC Managed Care – PPO | Admitting: Cardiothoracic Surgery

## 2014-12-08 ENCOUNTER — Encounter: Payer: Self-pay | Admitting: Cardiothoracic Surgery

## 2014-12-08 ENCOUNTER — Ambulatory Visit (INDEPENDENT_AMBULATORY_CARE_PROVIDER_SITE_OTHER): Payer: BLUE CROSS/BLUE SHIELD | Admitting: Cardiothoracic Surgery

## 2014-12-08 VITALS — BP 139/73 | HR 80 | Resp 20 | Ht 69.0 in | Wt 260.0 lb

## 2014-12-08 DIAGNOSIS — I25119 Atherosclerotic heart disease of native coronary artery with unspecified angina pectoris: Secondary | ICD-10-CM | POA: Diagnosis not present

## 2014-12-08 DIAGNOSIS — Z951 Presence of aortocoronary bypass graft: Secondary | ICD-10-CM

## 2014-12-08 NOTE — Progress Notes (Signed)
PCP is Elsie Stain, MD Referring Provider is Wellington Hampshire, MD  Chief Complaint  Patient presents with  . Routine Post Op    4 month f/u    HPI: Patient returns for routine followup and review of progress. His current activity level is excellent -- he walks 30 minutes and works out  for 30 minutes at least twice a week.he does complain of decreasing stamina. He has been monitored  closely by his endocrinologist for improving diabetic control. 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 Denies any symptoms of angina or CHF. Review of his record indicates he has not had a repeat echo since his CABG October 2015.   Past Medical History  Diagnosis Date  . Hypertension 2000  . Coronary artery disease     a. CABG 04/2014: (LIMA--> LAD, SVG --> DIAG, SVG--> OM1, SVG--> PDA)  b. early graft failure. s/p successful rotational atherectomy and stenting of the left main and proximal LAD with DES. DES of mid LAD.(07/22/14)  . Hyperlipidemia   . OSA on CPAP   . Type II diabetes mellitus   . CVA (cerebral infarction)   . Prostate cancer     Past Surgical History  Procedure Laterality Date  . Prostate biopsy  04/03/06 & 02/25/07  . High intense focused ultrasound  07/20/06    By Dr. Jacqlyn Larsen  . Cytoscopy prostatic stone o/w nml  06/21/08    Dr. Jacqlyn Larsen  . Ett myoview  09/13/09    Low risk EF 49%  . Anterior cervical decomp/discectomy fusion  08/25/2012    Procedure: ANTERIOR CERVICAL DECOMPRESSION/DISCECTOMY FUSION 2 LEVELS;  Surgeon: Hosie Spangle, MD;  Location: [facility];  Service: Neurosurgery;  Laterality: N/A;  Cervical five-six Cervical six-seven anterior cervical decompression with fusion and plating and bonegraft  . Intraoperative transesophageal echocardiogram N/A 04/26/2014    Procedure: INTRAOPERATIVE TRANSESOPHAGEAL ECHOCARDIOGRAM;  Surgeon: Ivin Poot, MD;  Location: [facility];  Service: Open Heart Surgery;  Laterality: N/A;  . Coronary artery bypass graft N/A  04/26/2014    Procedure: CORONARY ARTERY BYPASS GRAFTING (CABG), on pump, times four, using left internal mammary artery, right greater saphenous vein harvested endoscopically.;  Surgeon: Ivin Poot, MD;  Location: [facility];  Service: Open Heart Surgery;  Laterality: N/A;  LIMA to LAD, SVG to DIAGONAL, SVG to OM1, SVG to PDA) with EVH of the RIGHT THIGH and LOWER EXTREMITY SAPHENOUS VEIN  . Back surgery    . Percutaneous coronary rotoblator intervention (pci-r) N/A 07/22/2014    Procedure: PERCUTANEOUS CORONARY ROTOBLATOR INTERVENTION (PCI-R);  Surgeon: Jariah Jarmon M Martinique, MD;  Location: [facility];  Service: Cardiovascular;  Laterality: N/A;  . Cardiac catheterization  04/2014; 07/19/2014    Family History  Problem Relation Age of Onset  . Diabetes Mother 50    DM  . Coronary artery disease Mother   . Hypertension Mother   . Heart attack Mother     CABG with MI in 2005  . Heart disease Mother     CV  . Hypertension Father   . Heart attack Father     CABG with Mi around 1997  . Coronary artery disease Father   . Heart disease Father     CV  . Diabetes Father   . Heart attack Brother     MI/PTCA  . Heart disease Brother     CV  . Diabetes Brother   . Heart attack Maternal Grandfather     MI  .  Prostate cancer Paternal Grandfather   . Breast cancer Neg Hx     Breast/ovarian/uterine cancer  . Colon cancer Neg Hx   . Depression Neg Hx   . Alcohol abuse Neg Hx     ETOH/drug abuse  . Stroke Neg Hx   . Diabetes Sister     DM    Social History History  Substance Use Topics  . Smoking status: Former Smoker -- 1.00 packs/day for 36 years    Types: Cigarettes    Quit date: 03/01/2014  . Smokeless tobacco: Never Used  . Alcohol Use: Yes     Comment: 07/19/2014 "might have a drink a couple times/yr"    Current Outpatient Prescriptions  Medication Sig Dispense Refill  . Albiglutide (TANZEUM) 30 MG PEN Inject 30 mg into the skin once a week. 4 each 3  . aspirin EC 81 MG tablet  Take 1 tablet (81 mg total) by mouth daily. 90 tablet 3  . bicalutamide (CASODEX) 50 MG tablet Take 50 mg by mouth daily.    . carvedilol (COREG) 3.125 MG tablet Take 1 tablet (3.125 mg total) by mouth 2 (two) times daily with a meal. 60 tablet 6  . clopidogrel (PLAVIX) 75 MG tablet Take 1 tablet (75 mg total) by mouth daily with breakfast. 30 tablet 11  . ferrous sulfate 325 (65 FE) MG tablet Take 1 tablet (325 mg total) by mouth daily with breakfast. For one month then stop.  3  . furosemide (LASIX) 40 MG tablet Take 1 tablet (40 mg total) by mouth daily as needed for fluid or edema. 30 tablet 3  . insulin aspart (NOVOLOG FLEXPEN) 100 UNIT/ML FlexPen Use on sliding scale between 25- 35 units prior to the meals 30 mL 3  . Insulin Glargine (TOUJEO SOLOSTAR) 300 UNIT/ML SOPN Inject 80 Units into the skin at bedtime. 6 pen 3  . Insulin Pen Needle 31G X 8 MM MISC Use as directed to inject blood sugar twice daily dx:250.00 100 each 12  . leuprolide (LUPRON) 30 MG injection Inject 45 mg into the muscle every 6 (six) months.    Marland Kitchen losartan (COZAAR) 25 MG tablet Take 0.5 tablets (12.5 mg total) by mouth daily. 30 tablet 6  . ONE TOUCH ULTRA TEST test strip USE AS DIRECTED TO TEST BLOOD SUGARS TWICE DAILY 200 each 3  . potassium chloride SA (K-DUR,KLOR-CON) 20 MEQ tablet Take 1 tablet (20 mEq total) by mouth daily as needed (Take one tabley if you take a Lasix tablet). 30 tablet 3  . rosuvastatin (CRESTOR) 5 MG tablet Take 1 tablet (5 mg total) by mouth daily. 30 tablet 11   No current facility-administered medications for this visit.    Allergies  Allergen Reactions  . Metformin And Related Nausea Only  . Statins     REACTION: JOINT ACHES    Review of Systems  The patient continues take regular Lupron injections for his prostate cancer history No symptoms of TIA or dizziness His weight has been stable and he denies fluid retention  BP 139/73 mmHg  Pulse 80  Resp 20  Ht 5\' 9"  (1.753 m)  Wt  260 lb (117.935 kg)  BMI 38.38 kg/m2  SpO2 97% Physical Exam Alert and comfortable Breath sounds clear Heart rhythm regular murmur or gallop No pedal edema  Diagnostic Tests: none  Impression: Clinically doing well following CABG followed by PCI for graft occlusion from poor diabetic targerts  Plan:return in 3 months with 2D echo   Collier Salina  Prescott Gum III, MD [facility] [phone]

## 2014-12-23 ENCOUNTER — Encounter: Payer: Self-pay | Admitting: Endocrinology

## 2014-12-23 ENCOUNTER — Ambulatory Visit (INDEPENDENT_AMBULATORY_CARE_PROVIDER_SITE_OTHER): Payer: BLUE CROSS/BLUE SHIELD | Admitting: Endocrinology

## 2014-12-23 VITALS — BP 118/66 | HR 65 | Resp 14 | Ht 69.0 in | Wt 267.0 lb

## 2014-12-23 DIAGNOSIS — I10 Essential (primary) hypertension: Secondary | ICD-10-CM | POA: Diagnosis not present

## 2014-12-23 DIAGNOSIS — E119 Type 2 diabetes mellitus without complications: Secondary | ICD-10-CM

## 2014-12-23 DIAGNOSIS — E669 Obesity, unspecified: Secondary | ICD-10-CM

## 2014-12-23 MED ORDER — ALBIGLUTIDE 50 MG ~~LOC~~ PEN: 50.0000 mg | PEN_INJECTOR | SUBCUTANEOUS | Status: DC

## 2014-12-23 NOTE — Assessment & Plan Note (Signed)
Last A1c above target. Recent sugars are being recorded in the morning, and I talked to him about checking them atleast 3 x daily. Sugars in morning are still elevated. He is going to work on checking sugars at other times of the day as well.    Increase Tanzeum 50 mg weekly. May see appetite reduction over the next few weeks. Change Novolog  To 28+1 scale with each meal.  Change Toujeo to 90 units daily at bedtime. Increase by 4 units weekly till morning sugars are below 130 Report back if starts to notice lows as the GLP-1 agent starts to act better.  Report back if does not tolerate higher dose of GLP-1 Labs next visit

## 2014-12-23 NOTE — Assessment & Plan Note (Signed)
BP at target today on current therapy. Last urine MA normal March 2016.  Is on low dose ACE-I.

## 2014-12-23 NOTE — Assessment & Plan Note (Signed)
Encouraged to follow diet/exercise modifications. Hopefully with increase of GLP-1, he will lose some weight.

## 2014-12-23 NOTE — Progress Notes (Signed)
Reason for visit-  Phillip SITAR Sr. is a 64 y.o.-year-old male, for management of Type 2 diabetes, uncontrolled, without complications. Associated hx CVA, CAD s/p ACS s/p CABG Sept 2015, post op Afib Last  Visit April 2016.   HPI- Patient has been diagnosed with diabetes in 1996. Recalls being initially on lifestyle modifications.  Tried  Metformin, Glipizide?,  Januvia, Victoza. he has  been on insulin since  2008  .Tried levemir, regular  and Humalog in the past  *was previously on Novolog as well 2015-was tough with his routine. Sales job, traveling in the day. Checking sugars also a problem.  * Didn't tolerate  Metformin due to GI upset.Unsure whether other forms tried. Last used 10 years ago. *januvia stopped March 2016 and changed to Tanzeum *Lantus changed to [facility] March 2016   Pt is currently on a regimen of:  - Toujeo 80 units qhs  -Tanzeum 30 mg [place] weekly ( march 2016) -Novolog 25+1 units qac( start April 2016)>>not using the scale at each meal and taking around 28 units when he doesn't check pre meal  *tolerating well, does not yet note any appetite reduction with GLP-1  Last hemoglobin A1c was: Lab Results  Component Value Date   HGBA1C 9.1* 10/21/2014   HGBA1C 9.6* 04/22/2014   HGBA1C 9.6* 03/23/2014     Pt checks his sugars 1-2a day . Uses One Touch glucometer. By download they are:  PREMEAL Breakfast Lunch Dinner Bedtime Overall  Glucose range: 173-284 179-279 204 180-382   Mean/median:        POST-MEAL PC Breakfast PC Lunch PC Dinner  Glucose range:     Mean/median:       Hypoglycemia-  No lows. Lowest sugar was n/a; he has hypoglycemia awareness at 70.   Dietary habits- eats three times daily plus 3 snacks through the day- feel hungry throughout the day. Tries to limit carbs, sweetened beverages, sodas, desserts. Recently met with a dietician as well , and was told that he was doing good.  Exercise- exercises at gym 2xweekly , golf Wednesday,  walks half hour other days- staying busy Weight - stable  Wt Readings from Last 3 Encounters:  12/23/14 267 lb (121.11 kg)  12/08/14 260 lb (117.935 kg)  11/25/14 267 lb 4 oz (121.224 kg)    Diabetes Complications-  Nephropathy- No  CKD, last BUN/creatinine-  Lab Results  Component Value Date   BUN 17 10/21/2014   CREATININE 0.88 10/21/2014   Lab Results  Component Value Date   GFR 92.87 10/21/2014   Lab Results  Component Value Date   MICRALBCREAT 13.4 10/21/2014     Retinopathy- No, Last DEE was in Feb 2016. Cataract surgery left eye March 2016.  Neuropathy- yes numbness and tingling inhis feet. No known neuropathy.  Associated history - Has CAD and other Cards history . Prior stroke. No hypothyroidism. his last TSH was  Lab Results  Component Value Date   TSH 2.740 07/07/2014    Hyperlipidemia-  his last set of lipids were- Currently on Crestor. Tolerating well with some joint aches.  Was previously Vitamin D deficient, now not taking any supplements.  Lab Results  Component Value Date   CHOL 170 07/20/2014   HDL 32* 07/20/2014   LDLCALC 116* 07/20/2014   LDLDIRECT 157.1 09/07/2013   TRIG 108 07/20/2014   CHOLHDL 5.3 07/20/2014    Blood Pressure/HTN- Patient's blood pressure is well controlled today on current regimen that includes ACE-I ( lisinopril).  Former smoker-  quit 8 months ago  I have reviewed the patient's past medical history,  medications and allergies.   Current Outpatient Prescriptions on File Prior to Visit  Medication Sig Dispense Refill  . aspirin EC 81 MG tablet Take 1 tablet (81 mg total) by mouth daily. 90 tablet 3  . bicalutamide (CASODEX) 50 MG tablet Take 50 mg by mouth daily.    . carvedilol (COREG) 3.125 MG tablet Take 1 tablet (3.125 mg total) by mouth 2 (two) times daily with a meal. 60 tablet 6  . clopidogrel (PLAVIX) 75 MG tablet Take 1 tablet (75 mg total) by mouth daily with breakfast. 30 tablet 11  . ferrous sulfate 325 (65  FE) MG tablet Take 1 tablet (325 mg total) by mouth daily with breakfast. For one month then stop.  3  . furosemide (LASIX) 40 MG tablet Take 1 tablet (40 mg total) by mouth daily as needed for fluid or edema. 30 tablet 3  . insulin aspart (NOVOLOG FLEXPEN) 100 UNIT/ML FlexPen Use on sliding scale between 25- 35 units prior to the meals 30 mL 3  . Insulin Glargine (TOUJEO SOLOSTAR) 300 UNIT/ML SOPN Inject 80 Units into the skin at bedtime. 6 pen 3  . Insulin Pen Needle 31G X 8 MM MISC Use as directed to inject blood sugar twice daily dx:250.00 100 each 12  . leuprolide (LUPRON) 30 MG injection Inject 45 mg into the muscle every 6 (six) months.    Marland Kitchen losartan (COZAAR) 25 MG tablet Take 0.5 tablets (12.5 mg total) by mouth daily. 30 tablet 6  . ONE TOUCH ULTRA TEST test strip USE AS DIRECTED TO TEST BLOOD SUGARS TWICE DAILY 200 each 3  . potassium chloride SA (K-DUR,KLOR-CON) 20 MEQ tablet Take 1 tablet (20 mEq total) by mouth daily as needed (Take one tabley if you take a Lasix tablet). 30 tablet 3  . rosuvastatin (CRESTOR) 5 MG tablet Take 1 tablet (5 mg total) by mouth daily. 30 tablet 11   No current facility-administered medications on file prior to visit.   Allergies  Allergen Reactions  . Metformin And Related Nausea Only  . Statins     REACTION: JOINT ACHES   Review of Systems- [ x ]  Complains of    [  ]  denies [  ] Recent weight change [ x ]  Fatigue [  ] polydipsia [  ] polyuria [  ]  nocturia [  ]  vision difficulty [  ] chest pain [  ] shortness of breath [  ] leg swelling [  ] cough [  ] nausea/vomiting [  ] diarrhea [  ] constipation [  ] abdominal pain [ x ]  tingling/numbness in extremities [  ]  concern with feet ( wounds/sores)   PE: BP 118/66 mmHg  Pulse 65  Resp 14  Ht 5' 9"  (1.753 m)  Wt 267 lb (121.11 kg)  BMI 39.41 kg/m2  SpO2 97% Wt Readings from Last 3 Encounters:  12/23/14 267 lb (121.11 kg)  12/08/14 260 lb (117.935 kg)  11/25/14 267 lb 4 oz (121.224 kg)    Exam: deferred  ASSESSMENT AND PLAN: Problem List Items Addressed This Visit      Cardiovascular and Mediastinum   Hypertension    BP at target today on current therapy. Last urine MA normal March 2016.  Is on low dose ACE-I.            Endocrine   Type II diabetes mellitus -  Primary    Last A1c above target. Recent sugars are being recorded in the morning, and I talked to him about checking them atleast 3 x daily. Sugars in morning are still elevated. He is going to work on checking sugars at other times of the day as well.    Increase Tanzeum 50 mg weekly. May see appetite reduction over the next few weeks. Change Novolog  To 28+1 scale with each meal.  Change Toujeo to 90 units daily at bedtime. Increase by 4 units weekly till morning sugars are below 130 Report back if starts to notice lows as the GLP-1 agent starts to act better.  Report back if does not tolerate higher dose of GLP-1 Labs next visit           Relevant Medications   Albiglutide 50 MG PEN     Other   Obesity    Encouraged to follow diet/exercise modifications. Hopefully with increase of GLP-1, he will lose some weight.        Relevant Medications   Albiglutide 50 MG PEN        - Return to clinic in 1 mo with sugar log/meter.  Jafet Wissing [facility] 12/23/2014 10:29 AM

## 2014-12-23 NOTE — Progress Notes (Signed)
Pre visit review using our clinic review tool, if applicable. No additional management support is needed unless otherwise documented below in the visit note. 

## 2014-12-23 NOTE — Patient Instructions (Addendum)
Check sugars 4 x daily ( before each meal and at bedtime).  Record them in a log book and bring that/meter to next appointment.   Increase tanzeum to 50 mg weekly. Report back if dont tolerate  Increase Toujeo to 90 units daily at bedtime. Increase by 4 units once weekly if morning sugars are over 130.  Change Novolog to 28+1 scale qac as below. Take it with each meal per chart  Please come back for a follow-up appointment in 1 month.   Blood Glucose (mg/dL)  Breakfast  (Units Novolog Insulin)  Lunch  (Units Novolog Insulin)  Supper  (Units Novolog Insulin)  Nighttime (Units Novolog Insulin)   <70 Treat the low blood sugar.  Recheck blood glucose  in 15 mins. If sugar is more than 70, then take the number of units of insulin in the 70-90 row, if before a meal.   70-90  17  17  17   0  91-130 (Base Dose)  28  28  28   0   131-150  29  29  29   0   151-200  30  30  30   0   201-250  31  31  31  0   251-300  32  32  32 1   301-350  33  33  33 2   351-400  34  34  34  3   401-450  35  35  35 4   >450  36  36  36  5   Toujeo insulin 90 units subcutaneous Nightly

## 2015-01-20 ENCOUNTER — Ambulatory Visit: Payer: BLUE CROSS/BLUE SHIELD | Admitting: Endocrinology

## 2015-02-01 ENCOUNTER — Encounter: Payer: Self-pay | Admitting: Endocrinology

## 2015-02-01 ENCOUNTER — Ambulatory Visit (INDEPENDENT_AMBULATORY_CARE_PROVIDER_SITE_OTHER): Payer: BLUE CROSS/BLUE SHIELD | Admitting: Endocrinology

## 2015-02-01 VITALS — BP 138/62 | HR 72 | Resp 12 | Ht 69.0 in | Wt 269.6 lb

## 2015-02-01 DIAGNOSIS — I10 Essential (primary) hypertension: Secondary | ICD-10-CM | POA: Diagnosis not present

## 2015-02-01 DIAGNOSIS — E119 Type 2 diabetes mellitus without complications: Secondary | ICD-10-CM | POA: Diagnosis not present

## 2015-02-01 NOTE — Progress Notes (Signed)
Pre visit review using our clinic review tool, if applicable. No additional management support is needed unless otherwise documented below in the visit note. 

## 2015-02-01 NOTE — Assessment & Plan Note (Addendum)
Last A1c above target. Update today as due.  Recent sugars in the morning are particularly elevated. Asked him to have an earlier supper and take a walk after supper to see if numbers improve.  Continue checking sugars 2-3 x daily. Continue increased Tanzeum 50 mg weekly. May see appetite reduction over the next few weeks. Change Novolog  To 32+1 scale with each meal.  Continue Toujeo  At 110 units daily at bedtime.  Report back if starts to notice lows as the GLP-1 agent starts to act better.  Report back if does not tolerate higher dose of GLP-1 If sugars do not improve on this regimen, then will consider changing to U500 due to high insulin requirements.

## 2015-02-01 NOTE — Progress Notes (Signed)
Reason for visit-  Phillip MAACK Sr. is a 64 y.o.-year-old male, for management of Type 2 diabetes, uncontrolled, without complications. Associated hx CVA, CAD s/p ACS s/p CABG Sept 2015, post op Afib Last  Visit  May 2016.   HPI- Patient has been diagnosed with diabetes in 1996. Recalls being initially on lifestyle modifications.  Tried  Metformin, Glipizide?,  Januvia, Victoza. he has  been on insulin since  2008  .Tried levemir, regular  and Humalog in the past  *was previously on Novolog as well 2015-was tough with his routine. Sales job, traveling in the day. Checking sugars also a problem.  * Didn't tolerate  Metformin due to GI upset.Unsure whether other forms tried. Last used 10 years ago. *januvia stopped March 2016 and changed to Tanzeum *Lantus changed to [facility] March 2016   Pt is currently on a regimen of:  - Toujeo 110 units qhs  -Tanzeum 50 mg [place] weekly ( march 2016, increased April 2016, has taken only 1 dose of 50 mg so far, as was finishing out his 30 mg pen supply) -Novolog 28+1 units qac( start April 2016)>>using it 2-3 times daily and requiring around 32 units with each meal  *tolerating well, does not yet note any appetite reduction with GLP-1  Last hemoglobin A1c was: Lab Results  Component Value Date   HGBA1C 9.1* 10/21/2014   HGBA1C 9.6* 04/22/2014   HGBA1C 9.6* 03/23/2014     Pt checks his sugars 1-2a day . Uses One Touch glucometer. By download they are:  PREMEAL Breakfast Lunch Dinner Bedtime Overall  Glucose range: 202-373 138-362 131-217 366-375   Mean/median:        POST-MEAL PC Breakfast PC Lunch PC Dinner  Glucose range:     Mean/median:      *eating late supper and watching TV after  Hypoglycemia-  No lows. Lowest sugar was n/a; he has hypoglycemia awareness at 70.   Dietary habits- eats three times daily plus 3 snacks through the day- feel hungry throughout the day. Tries to limit carbs, sweetened beverages, sodas, desserts.  Recently met with a dietician as well , and was told that he was doing good.  Exercise- exercises at gym 2xweekly , golf Wednesday, walks half hour other days- staying busy Weight - fluctuating  Wt Readings from Last 3 Encounters:  02/01/15 269 lb 9.6 oz (122.29 kg)  12/23/14 267 lb (121.11 kg)  12/08/14 260 lb (117.935 kg)    Diabetes Complications-  Nephropathy- No  CKD, last BUN/creatinine-  Lab Results  Component Value Date   BUN 17 10/21/2014   CREATININE 0.88 10/21/2014   Lab Results  Component Value Date   GFR 92.87 10/21/2014   Lab Results  Component Value Date   MICRALBCREAT 13.4 10/21/2014     Retinopathy- No, Last DEE was in Feb 2016. Cataract surgery left eye March 2016.  Neuropathy- yes numbness and tingling inhis feet. No known neuropathy.  Associated history - Has CAD and other Cards history . Prior stroke. No hypothyroidism. his last TSH was  Lab Results  Component Value Date   TSH 2.740 07/07/2014    Hyperlipidemia-  his last set of lipids were- Currently off Crestor. Was not tolerating due to joint aches.  Was previously Vitamin D deficient, now not taking any supplements.  Lab Results  Component Value Date   CHOL 170 07/20/2014   HDL 32* 07/20/2014   LDLCALC 116* 07/20/2014   LDLDIRECT 157.1 09/07/2013   TRIG 108 07/20/2014  CHOLHDL 5.3 07/20/2014    Blood Pressure/HTN- Patient's blood pressure is well controlled today on current regimen that includes ACE-I ( lisinopril).  Former smoker- quit 8+ months ago  I have reviewed the patient's past medical history,  medications and allergies.   Current Outpatient Prescriptions on File Prior to Visit  Medication Sig Dispense Refill  . Albiglutide 50 MG PEN Inject 50 mg into the skin once a week. 4 each 3  . aspirin EC 81 MG tablet Take 1 tablet (81 mg total) by mouth daily. 90 tablet 3  . bicalutamide (CASODEX) 50 MG tablet Take 50 mg by mouth daily.    . carvedilol (COREG) 3.125 MG tablet Take 1  tablet (3.125 mg total) by mouth 2 (two) times daily with a meal. 60 tablet 6  . clopidogrel (PLAVIX) 75 MG tablet Take 1 tablet (75 mg total) by mouth daily with breakfast. 30 tablet 11  . ferrous sulfate 325 (65 FE) MG tablet Take 1 tablet (325 mg total) by mouth daily with breakfast. For one month then stop.  3  . furosemide (LASIX) 40 MG tablet Take 1 tablet (40 mg total) by mouth daily as needed for fluid or edema. 30 tablet 3  . insulin aspart (NOVOLOG FLEXPEN) 100 UNIT/ML FlexPen Use on sliding scale between 25- 35 units prior to the meals 30 mL 3  . Insulin Glargine (TOUJEO SOLOSTAR) 300 UNIT/ML SOPN Inject 80 Units into the skin at bedtime. (Patient taking differently: Inject 110 Units into the skin at bedtime. ) 6 pen 3  . Insulin Pen Needle 31G X 8 MM MISC Use as directed to inject blood sugar twice daily dx:250.00 100 each 12  . leuprolide (LUPRON) 30 MG injection Inject 45 mg into the muscle every 6 (six) months.    Marland Kitchen losartan (COZAAR) 25 MG tablet Take 0.5 tablets (12.5 mg total) by mouth daily. 30 tablet 6  . ONE TOUCH ULTRA TEST test strip USE AS DIRECTED TO TEST BLOOD SUGARS TWICE DAILY 200 each 3  . potassium chloride SA (K-DUR,KLOR-CON) 20 MEQ tablet Take 1 tablet (20 mEq total) by mouth daily as needed (Take one tabley if you take a Lasix tablet). 30 tablet 3   No current facility-administered medications on file prior to visit.   Allergies  Allergen Reactions  . Metformin And Related Nausea Only  . Statins     REACTION: JOINT ACHES   Review of Systems- [ x ]  Complains of    [  ]  denies [  ] Recent weight change [  ]  Fatigue [  ] polydipsia [  ] polyuria [  ]  nocturia [  ]  vision difficulty [  ] chest pain [  ] shortness of breath [  ] leg swelling [  ] cough [  ] nausea/vomiting [  ] diarrhea [  ] constipation [  ] abdominal pain [ x ]  tingling/numbness in extremities [  ]  concern with feet ( wounds/sores)   PE: BP 138/62 mmHg  Pulse 72  Resp 12  Ht 5' 9"   (1.753 m)  Wt 269 lb 9.6 oz (122.29 kg)  BMI 39.79 kg/m2  SpO2 96% Wt Readings from Last 3 Encounters:  02/01/15 269 lb 9.6 oz (122.29 kg)  12/23/14 267 lb (121.11 kg)  12/08/14 260 lb (117.935 kg)   Exam: deferred  ASSESSMENT AND PLAN: Problem List Items Addressed This Visit      Cardiovascular and Mediastinum   Hypertension  BP at target today on current therapy. Last urine MA normal March 2016.  Is on low dose ACE-I.              Endocrine   Type II diabetes mellitus - Primary    Last A1c above target. Update today as due.  Recent sugars in the morning are particularly elevated. Asked him to have an earlier supper and take a walk after supper to see if numbers improve.  Continue checking sugars 2-3 x daily. Continue increased Tanzeum 50 mg weekly. May see appetite reduction over the next few weeks. Change Novolog  To 32+1 scale with each meal.  Continue Toujeo  At 110 units daily at bedtime.  Report back if starts to notice lows as the GLP-1 agent starts to act better.  Report back if does not tolerate higher dose of GLP-1 If sugars do not improve on this regimen, then will consider changing to U500 due to high insulin requirements.          Relevant Orders   Hemoglobin A1c        - Return to clinic in 6 weeks with sugar log/meter. Explained that [place] am [place] of state, and he has elected to follow up with my colleagues at [employer] location.   Magdalen Cabana [facility] 02/01/2015 4:16 PM

## 2015-02-01 NOTE — Patient Instructions (Addendum)
Insulin changed as follows:  Blood Glucose (mg/dL)  Breakfast  (Units Novolog Insulin)  Lunch  (Units Novolog Insulin)  Supper  (Units Novolog Insulin)  Nighttime (Units Novolog Insulin)   <70 Treat the low blood sugar.  Recheck blood glucose  in 15 mins. If sugar is more than 70, then take the number of units of insulin in the 70-90 row, if before a meal.   70-90  19  19  19   0  91-130 (Base Dose)  32  32  32  0   131-150  33  33  33  0   151-200  34  34  34  0   201-250  35  35  35 0   251-300  36  36  36 1   301-350  37  37  37 2   351-400  38  38  38  3   401-450  39  39  39 4   >450  40  40  40  5   Toujeo insulin 110 units subcutaneous      Please try to eat earlier supper and try adding a walk around the block after supper to see if morning numbers start getting better.

## 2015-02-01 NOTE — Assessment & Plan Note (Signed)
BP at target today on current therapy. Last urine MA normal March 2016.  Is on low dose ACE-I.

## 2015-02-02 LAB — HEMOGLOBIN A1C: Hgb A1c MFr Bld: 8.5 % — ABNORMAL HIGH (ref 4.6–6.5)

## 2015-02-03 ENCOUNTER — Ambulatory Visit: Payer: BLUE CROSS/BLUE SHIELD | Admitting: Endocrinology

## 2015-02-04 ENCOUNTER — Other Ambulatory Visit: Payer: Self-pay | Admitting: *Deleted

## 2015-02-04 DIAGNOSIS — I25700 Atherosclerosis of coronary artery bypass graft(s), unspecified, with unstable angina pectoris: Secondary | ICD-10-CM

## 2015-02-21 ENCOUNTER — Ambulatory Visit (HOSPITAL_COMMUNITY)
Admission: RE | Admit: 2015-02-21 | Discharge: 2015-02-21 | Disposition: A | Discharge status: Home / Self Care | Payer: BLUE CROSS/BLUE SHIELD | Source: Ambulatory Visit | Attending: Cardiothoracic Surgery | Admitting: Cardiothoracic Surgery

## 2015-02-21 DIAGNOSIS — I25700 Atherosclerosis of coronary artery bypass graft(s), unspecified, with unstable angina pectoris: Secondary | ICD-10-CM

## 2015-02-21 DIAGNOSIS — I340 Nonrheumatic mitral (valve) insufficiency: Secondary | ICD-10-CM | POA: Insufficient documentation

## 2015-02-21 DIAGNOSIS — I517 Cardiomegaly: Secondary | ICD-10-CM | POA: Diagnosis not present

## 2015-02-21 DIAGNOSIS — I2510 Atherosclerotic heart disease of native coronary artery without angina pectoris: Secondary | ICD-10-CM | POA: Diagnosis present

## 2015-02-21 NOTE — Progress Notes (Signed)
  Echocardiogram 2D Echocardiogram has been performed.  Phillip Clements 02/21/2015, 12:00 PM

## 2015-03-07 ENCOUNTER — Ambulatory Visit (INDEPENDENT_AMBULATORY_CARE_PROVIDER_SITE_OTHER): Payer: BLUE CROSS/BLUE SHIELD | Admitting: Podiatry

## 2015-03-07 ENCOUNTER — Encounter: Payer: BLUE CROSS/BLUE SHIELD | Admitting: Thoracic Surgery (Cardiothoracic Vascular Surgery)

## 2015-03-07 ENCOUNTER — Encounter: Payer: Self-pay | Admitting: Podiatry

## 2015-03-07 VITALS — BP 136/65 | HR 72 | Temp 96.7°F | Resp 15 | Ht 69.0 in | Wt 260.0 lb

## 2015-03-07 DIAGNOSIS — L600 Ingrowing nail: Secondary | ICD-10-CM | POA: Diagnosis not present

## 2015-03-07 DIAGNOSIS — M79676 Pain in unspecified toe(s): Secondary | ICD-10-CM

## 2015-03-07 DIAGNOSIS — I739 Peripheral vascular disease, unspecified: Secondary | ICD-10-CM

## 2015-03-07 DIAGNOSIS — R21 Rash and other nonspecific skin eruption: Secondary | ICD-10-CM

## 2015-03-07 DIAGNOSIS — B351 Tinea unguium: Secondary | ICD-10-CM

## 2015-03-07 NOTE — Progress Notes (Signed)
   Subjective:    Patient ID: Phillip Clements, male    DOB: 05-18-1951, 64 y.o.   MRN: [id number]  HPI  64 year old male presents the office so concerns of painful ingrown toenails to both of his big toes as well as for painful, elongated toenails which are thickened he is unable to trim them himself. He denies any redness or drainage locking the nail sites. States that his nails are particular painful with pressure in certain shoes. As he states he is a rash and have his right foot which has been present for approximate 10 months after undergoing bypass. He's had no treatment for this. States the area does not itch or bleed. No other complaints at this time. He is diabetic and his last A1c was 8.5.  Review of Systems  Hematological: Bruises/bleeds easily.       Objective:   Physical Exam AAO 3, NAD DP/PT pulses 1/4 bilaterally, CRT less than 3 seconds Protective sensation decreased with Simms Weinstein monofilament, vibratory sensation intact, Achilles tendon reflex intact There is evidence of incurvation of both the medial and lateral aspects of bilateral hallux nail borders. Nails are also hypertrophic, dystrophic, brittle, discolored, elongated 10. There is no swelling erythema or drainage from any the nail sites. There is tenderness to palpation overlying nails 1-5 bilaterally. On the dorsal aspect of the right foot mostly just proximal to the first interspace there is a slightly faint erythematous rash present. There is no open sores and there is no bleeding or drainage. No open lesions or pre-ulcerative lesions No other areas of tenderness to bilateral lower extremities. MMT 5/5, ROM WNL No pain with calf compression, swelling, warmth, erythema     Assessment & Plan:  64 year old male with symptomatic ingrown toenails, symptomatic onychomycosis -Treatment options discussed including all alternatives, risks, and complications -Nails are sharply debrided 10 without,  location/bleeding. Care was taken to debride the symptomatic portion of the ingrown toenails. I discussed with a possible partial nail avulsions however given his vascular status as well as A1c without the setting of infection would hold off on any procedure given high risk of nonhealing. -Recommended to start with an over-the-counter anti-fungal cream to see if this will help the rash. Now we will switch to a steroid. -Discussed the importance of daily foot inspection -Follow-up 3 months or sooner if any problems arise. In the meantime, encouraged to call the office with any questions, concerns, change in symptoms.   Celesta Gentile, DPM

## 2015-03-08 ENCOUNTER — Encounter: Payer: Self-pay | Admitting: Podiatry

## 2015-03-09 ENCOUNTER — Ambulatory Visit (INDEPENDENT_AMBULATORY_CARE_PROVIDER_SITE_OTHER): Payer: BLUE CROSS/BLUE SHIELD | Admitting: Cardiothoracic Surgery

## 2015-03-09 ENCOUNTER — Encounter: Payer: Self-pay | Admitting: Cardiothoracic Surgery

## 2015-03-09 VITALS — BP 115/60 | HR 75 | Resp 16 | Ht 69.0 in | Wt 260.0 lb

## 2015-03-09 DIAGNOSIS — I340 Nonrheumatic mitral (valve) insufficiency: Secondary | ICD-10-CM

## 2015-03-09 DIAGNOSIS — Z951 Presence of aortocoronary bypass graft: Secondary | ICD-10-CM

## 2015-03-09 DIAGNOSIS — I25119 Atherosclerotic heart disease of native coronary artery with unspecified angina pectoris: Secondary | ICD-10-CM

## 2015-03-09 NOTE — Progress Notes (Signed)
PCP is Elsie Stain, MD Referring Provider is Wellington Hampshire, MD  Chief Complaint  Patient presents with  . Follow-up    3 month f/u, ECHO 02/21/2015    HPI:the patient returns for month followup after multivessel CABG followed by subsequent PCI of the left main and proximal LAD for graft closure do to poor distal targets. 2 weeks ago the patient had a echocardiogram which showed EF of 55-60% with mild-moderate mitral regurgitation. The patient is asymptomatic. 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 He walks 45 minutes on most days. He denies angina. He does have some bilateral calf claudication when he walks which resolves when he walks slower.  The patient was seen and evaluated by Dr.Arita 3 months ago in [place]. He just his medications. Since that time the patient stopped taking Crestor because of musculoskeletal pain in both hands. The patient is seeing an endocrinologist regularly to optimize therapy of his poorly controlled diabetes. He continues with a struggle to lose weight and currently weighs 265-70 pounds.   Past Medical History  Diagnosis Date  . Hypertension 2000  . Coronary artery disease     a. CABG 04/2014: (LIMA--> LAD, SVG --> DIAG, SVG--> OM1, SVG--> PDA)  b. early graft failure. s/p successful rotational atherectomy and stenting of the left main and proximal LAD with DES. DES of mid LAD.(07/22/14)  . Hyperlipidemia   . OSA on CPAP   . Type II diabetes mellitus   . CVA (cerebral infarction)   . Prostate cancer     Past Surgical History  Procedure Laterality Date  . Prostate biopsy  04/03/06 & 02/25/07  . High intense focused ultrasound  07/20/06    By Dr. Jacqlyn Larsen  . Cytoscopy prostatic stone o/w nml  06/21/08    Dr. Jacqlyn Larsen  . Ett myoview  09/13/09    Low risk EF 49%  . Anterior cervical decomp/discectomy fusion  08/25/2012    Procedure: ANTERIOR CERVICAL DECOMPRESSION/DISCECTOMY FUSION 2 LEVELS;  Surgeon: Hosie Spangle, MD;   Location: [place] [facility];  Service: Neurosurgery;  Laterality: N/A;  Cervical five-six Cervical six-seven anterior cervical decompression with fusion and plating and bonegraft  . Intraoperative transesophageal echocardiogram N/A 04/26/2014    Procedure: INTRAOPERATIVE TRANSESOPHAGEAL ECHOCARDIOGRAM;  Surgeon: Ivin Poot, MD;  Location: [place];  Service: Open Heart Surgery;  Laterality: N/A;  . Coronary artery bypass graft N/A 04/26/2014    Procedure: CORONARY ARTERY BYPASS GRAFTING (CABG), on pump, times four, using left internal mammary artery, right greater saphenous vein harvested endoscopically.;  Surgeon: Ivin Poot, MD;  Location: [place];  Service: Open Heart Surgery;  Laterality: N/A;  LIMA to LAD, SVG to DIAGONAL, SVG to OM1, SVG to PDA) with EVH of the RIGHT THIGH and LOWER EXTREMITY SAPHENOUS VEIN  . Back surgery    . Percutaneous coronary rotoblator intervention (pci-r) N/A 07/22/2014    Procedure: PERCUTANEOUS CORONARY ROTOBLATOR INTERVENTION (PCI-R);  Surgeon: Kanaan Kagawa M Martinique, MD;  [facility] C[facility];  Service: Cardiovascular;  Laterality: N/A;  . Cardiac catheterization  04/2014; 07/19/2014    Family History  Problem Relation Age of Onset  . Diabetes Mother 23    DM  . Coronary artery disease Mother   . Hypertension Mother   . Heart attack Mother     CABG with MI in 2005  . Heart disease Mother     CV  . Hypertension Father   . Heart attack Father     CABG  with Mi around 1997  . Coronary artery disease Father   . Heart disease Father     CV  . Diabetes Father   . Heart attack Brother     MI/PTCA  . Heart disease Brother     CV  . Diabetes Brother   . Heart attack Maternal Grandfather     MI  . Prostate cancer Paternal Grandfather   . Breast cancer Neg Hx     Breast/ovarian/uterine cancer  . Colon cancer Neg Hx   . Depression Neg Hx   . Alcohol abuse Neg Hx     ETOH/drug abuse  . Stroke Neg Hx   . Diabetes Sister     DM    Social History History   Substance Use Topics  . Smoking status: Former Smoker -- 1.00 packs/day for 36 years    Types: Cigarettes    Quit date: 03/01/2014  . Smokeless tobacco: Never Used  . Alcohol Use: Yes     Comment: 07/19/2014 "might have a drink a couple times/yr"    Current Outpatient Prescriptions  Medication Sig Dispense Refill  . Albiglutide 50 MG PEN Inject 50 mg into the skin once a week. 4 each 3  . aspirin EC 81 MG tablet Take 1 tablet (81 mg total) by mouth daily. 90 tablet 3  . bicalutamide (CASODEX) 50 MG tablet Take 50 mg by mouth daily.    . carvedilol (COREG) 3.125 MG tablet Take 1 tablet (3.125 mg total) by mouth 2 (two) times daily with a meal. 60 tablet 6  . clopidogrel (PLAVIX) 75 MG tablet Take 1 tablet (75 mg total) by mouth daily with breakfast. 30 tablet 11  . ferrous sulfate 325 (65 FE) MG tablet Take 1 tablet (325 mg total) by mouth daily with breakfast. For one month then stop.  3  . furosemide (LASIX) 40 MG tablet Take 1 tablet (40 mg total) by mouth daily as needed for fluid or edema. 30 tablet 3  . insulin aspart (NOVOLOG FLEXPEN) 100 UNIT/ML FlexPen Use on sliding scale between 25- 35 units prior to the meals 30 mL 3  . Insulin Glargine (TOUJEO SOLOSTAR) 300 UNIT/ML SOPN Inject 80 Units into the skin at bedtime. (Patient taking differently: Inject 110 Units into the skin at bedtime. ) 6 pen 3  . Insulin Pen Needle 31G X 8 MM MISC Use as directed to inject blood sugar twice daily dx:250.00 100 each 12  . leuprolide (LUPRON) 30 MG injection Inject 45 mg into the muscle every 6 (six) months.    Marland Kitchen losartan (COZAAR) 25 MG tablet Take 0.5 tablets (12.5 mg total) by mouth daily. 30 tablet 6  . NITROSTAT 0.4 MG SL tablet Place 0.4 mg under the tongue as needed.   1  . ONE TOUCH ULTRA TEST test strip USE AS DIRECTED TO TEST BLOOD SUGARS TWICE DAILY 200 each 3  . potassium chloride SA (K-DUR,KLOR-CON) 20 MEQ tablet Take 1 tablet (20 mEq total) by mouth daily as needed (Take one tabley if  you take a Lasix tablet). 30 tablet 3   No current facility-administered medications for this visit.    Allergies  Allergen Reactions  . Metformin And Related Nausea Only  . Statins     REACTION: JOINT ACHES    Review of Systems    Review of Systems  General:  No weight loss   no fever   no decreased energy  no night sweats Cardiac: - Chest pain with exertion - resting  chest pain   -SOB with exertion -  Orthopnea                  -PND + mild ankle edema-  syncope Pulmonary:  no dyspnea,no cough, no productive cough no home oxygen no hemoptysis GI: no difficulty swallowing  no GERD no jaundice  no melena  no hematemesis no        abdominal pain GU:  no dysuria  no hematuria  no frequent UTI no BPH Vascular:  no claudication  No TIA  No varicose veins no DVT Neuro:  no sroke no seizures no TIA no head trauma no vision changes Musculoskeletal:  normal mobility no arthritis  no gout  no joint swelling Skin: no rash  no skin ulceration  no skin cancer Endocrine: diabetes  no thyroid didease Hematologic: ++ easy bruising  no blood transfusions  no frequent epistaxis ENT : no painful teeth no dentures no loose teeth Psych : no anxiety  no depression no psych hospitalizations        BP 115/60 mmHg  Pulse 75  Resp 16  Ht 5\' 9"  (1.753 m)  Wt 260 lb (117.935 kg)  BMI 38.38 kg/m2  SpO2 97% Physical Exam     Physical Exam  General: obese middle-aged Caucasian male no acute distress  HEENT: Normocephalic pupils equal , dentition adequate Neck: Supple without JVD, adenopathy, or bruit Chest: Clear to auscultation, symmetrical breath sounds, no rhonchi, no tenderness             or deformity Cardiovascular: Regular rate and rhythm, no murmur, no gallop, peripheral pulses 1+ in extremities              Abdomen:  Soft, obese,nontender, no palpable mass or organomegaly Extremities: mild edema but well-perfused, no clubbing cyanosis edema or tenderness,              no venous  stasis changes of the legs Rectal/GU: Deferred Neuro: Grossly non--focal and symmetrical throughout Skin: Clean and dry without rash or ulceration   Diagnostic Tests: Results of echocardiogram personally reviewed with the patient  Impression: Severe coronary disease in a diabetic pattern doing well with PCI after graft failure with CABG  Intolerance to statins due to musculoskeletal pain in both hands -- he will try omega-3 fish oil tablets  Bilateral calf claudication-he'll continue regular walking program to walk until he claudicates  Then rest and  continue to complete a 20 minute walking schedule in order to build up lower extremity. collateral vessels  Plan: Return for review progress in 4 months  Len Childs, MD [facility] [phone]

## 2015-03-11 ENCOUNTER — Other Ambulatory Visit: Payer: Self-pay | Admitting: Family Medicine

## 2015-03-15 ENCOUNTER — Ambulatory Visit: Payer: BLUE CROSS/BLUE SHIELD | Admitting: Podiatry

## 2015-03-18 ENCOUNTER — Encounter: Payer: Self-pay | Admitting: Internal Medicine

## 2015-03-18 ENCOUNTER — Ambulatory Visit (INDEPENDENT_AMBULATORY_CARE_PROVIDER_SITE_OTHER): Payer: BLUE CROSS/BLUE SHIELD | Admitting: Internal Medicine

## 2015-03-18 VITALS — BP 128/88 | HR 68 | Temp 98.3°F | Resp 16 | Ht 70.0 in | Wt 271.0 lb

## 2015-03-18 DIAGNOSIS — E1151 Type 2 diabetes mellitus with diabetic peripheral angiopathy without gangrene: Secondary | ICD-10-CM | POA: Diagnosis not present

## 2015-03-18 DIAGNOSIS — E1159 Type 2 diabetes mellitus with other circulatory complications: Secondary | ICD-10-CM | POA: Insufficient documentation

## 2015-03-18 DIAGNOSIS — E1165 Type 2 diabetes mellitus with hyperglycemia: Secondary | ICD-10-CM | POA: Insufficient documentation

## 2015-03-18 MED ORDER — CANAGLIFLOZIN 100 MG PO TABS: 100.0000 mg | ORAL_TABLET | Freq: Every day | ORAL | Status: DC

## 2015-03-18 NOTE — Progress Notes (Signed)
Patient ID: Phillip Clements, male   DOB: 07-12-1951, 64 y.o.   MRN: [id number]  HPI: Phillip Clements is a 64 y.o.-year-old male, referred by his PCP, Dr. Damita Dunnings, for management of DM2, dx in 1996, insulin-dependent since 2008, uncontrolled, with complications (CAD - s/p stents, CABG, cerebro-vascular ds - s/p CVA). Prev. Seen by Dr Howell Rucks.  Last hemoglobin A1c was: Lab Results  Component Value Date   HGBA1C 8.5* 02/01/2015   HGBA1C 9.1* 10/21/2014   HGBA1C 9.6* 04/22/2014   Pt is on a regimen of: - Toujeo 110 units at bedtime - Novolog 27 >> 32-36 units 3x a day, before meals + SSI (forgets 2-3x a week) - Tanzeum 50 mg weekly He tried Victoza and Januvia Had GI intolerance (nausea) to Metformin.  Pt checks his sugars  1-3x a day and they are: - am: 173-250 - 2h after b'fast: n/c - before lunch: 77, 82, 113-221, 340 - 2h after lunch: n/c - before dinner: 159, 167 - 2h after dinner: n/c - bedtime: 198, 254 - nighttime: n/c No lows. Lowest sugar was 77; he has hypoglycemia awareness at 70s.  Highest sugar was 340.  Glucometer: OneTouch Ultra mini  Pt's meals are: - Breakfast: thin bagel + cream cheese + special K cinnamon cereal - Lunch: sandwich, chips, cottage cheese, pineapple - Dinner: meat + 2 veggies, salads - Snacks: 2/day: celery + PB; low salt triscuit + laughing cow cheese  Exercise: cardio + weights 2x a day; golf on wed.  - no CKD, last BUN/creatinine:  Lab Results  Component Value Date   BUN 17 10/21/2014   CREATININE 0.88 10/21/2014  On Losartan - last set of lipids: Lab Results  Component Value Date   CHOL 170 07/20/2014   HDL 32* 07/20/2014   LDLCALC 116* 07/20/2014   LDLDIRECT 157.1 09/07/2013   TRIG 108 07/20/2014   CHOLHDL 5.3 07/20/2014  Statins >> joint pain.  - last eye exam was in 10/04/2014. [facility]. No DR. Had cataract sx. - no numbness and tingling in his feet.  Pt has FH of DM: M, F, sister,  brother.  ROS: Constitutional: no weight gain/loss, no fatigue, no subjective hyperthermia/hypothermia Eyes: no blurry vision, no xerophthalmia ENT: no sore throat, no nodules palpated in throat, no dysphagia/odynophagia, no hoarseness, + decreased hearing, + tinnitus Cardiovascular: no CP/SOB/palpitations/leg swelling Respiratory: no cough/SOB Gastrointestinal: no N/V/D/C Musculoskeletal: no muscle/joint aches Skin: no rashes, + easy bruising Neurological: no tremors/numbness/tingling/dizziness Psychiatric: no depression/anxiety  Past Medical History  Diagnosis Date  . Hypertension 2000  . Coronary artery disease     a. CABG 04/2014: (LIMA--> LAD, SVG --> DIAG, SVG--> OM1, SVG--> PDA)  b. early graft failure. s/p successful rotational atherectomy and stenting of the left main and proximal LAD with DES. DES of mid LAD.(07/22/14)  . Hyperlipidemia   . OSA on CPAP   . Type II diabetes mellitus   . CVA (cerebral infarction)   . Prostate cancer    Past Surgical History  Procedure Laterality Date  . Prostate biopsy  04/03/06 & 02/25/07  . High intense focused ultrasound  07/20/06    By Dr. Jacqlyn Larsen  . Cytoscopy prostatic stone o/w nml  06/21/08    Dr. Jacqlyn Larsen  . Ett myoview  09/13/09    Low risk EF 49%  . Anterior cervical decomp/discectomy fusion  08/25/2012    Procedure: ANTERIOR CERVICAL DECOMPRESSION/DISCECTOMY FUSION 2 LEVELS;  Surgeon: Hosie Spangle, MD;  Location: [place] [facility];  Service: Neurosurgery;  Laterality: N/A;  Cervical five-six Cervical six-seven anterior cervical decompression with fusion and plating and bonegraft  . Intraoperative transesophageal echocardiogram N/A 04/26/2014    Procedure: INTRAOPERATIVE TRANSESOPHAGEAL ECHOCARDIOGRAM;  Surgeon: Ivin Poot, MD;  Location: [place];  Service: Open Heart Surgery;  Laterality: N/A;  . Coronary artery bypass graft N/A 04/26/2014    Procedure: CORONARY ARTERY BYPASS GRAFTING (CABG), on pump, times four, using left internal  mammary artery, right greater saphenous vein harvested endoscopically.;  Surgeon: Ivin Poot, MD;  Location: [place];  Service: Open Heart Surgery;  Laterality: N/A;  LIMA to LAD, SVG to DIAGONAL, SVG to OM1, SVG to PDA) with EVH of the RIGHT THIGH and LOWER EXTREMITY SAPHENOUS VEIN  . Back surgery    . Percutaneous coronary rotoblator intervention (pci-r) N/A 07/22/2014    Procedure: PERCUTANEOUS CORONARY ROTOBLATOR INTERVENTION (PCI-R);  Surgeon: Peter M Martinique, MD;  [facility] C[facility];  Service: Cardiovascular;  Laterality: N/A;  . Cardiac catheterization  04/2014; 07/19/2014   History   Social History  . Marital Status: Married    Spouse Name: N/A  . Number of Children: 3  . Years of Education: N/A   Occupational History  . Insurance account manager    Social History Main Topics  . Smoking status: Former Smoker -- 1.00 packs/day for 36 years    Types: Cigarettes    Quit date: 03/01/2014  . Smokeless tobacco: Never Used  . Alcohol Use: Yes     Comment: 07/19/2014 "might have a drink a couple times/yr"  . Drug Use: No  . Sexual Activity: Not Currently   Other Topics Concern  . Not on file   Social History Narrative   Lives with wife.   2 daughters and 1 son.   [employer] fan   Runs [employer]   Current Outpatient Prescriptions on File Prior to Visit  Medication Sig Dispense Refill  . Albiglutide 50 MG PEN Inject 50 mg into the skin once a week. 4 each 3  . aspirin EC 81 MG tablet Take 1 tablet (81 mg total) by mouth daily. 90 tablet 3  . B-D ULTRAFINE III SHORT PEN 31G X 8 MM MISC USE AS DIRECTED TWICE DAILY 100 each 11  . bicalutamide (CASODEX) 50 MG tablet Take 50 mg by mouth daily.    . carvedilol (COREG) 3.125 MG tablet Take 1 tablet (3.125 mg total) by mouth 2 (two) times daily with a meal. 60 tablet 6  . clopidogrel (PLAVIX) 75 MG tablet Take 1 tablet (75 mg total) by mouth daily with breakfast. 30 tablet 11  . ferrous sulfate 325 (65 FE)  MG tablet Take 1 tablet (325 mg total) by mouth daily with breakfast. For one month then stop.  3  . furosemide (LASIX) 40 MG tablet Take 1 tablet (40 mg total) by mouth daily as needed for fluid or edema. 30 tablet 3  . insulin aspart (NOVOLOG FLEXPEN) 100 UNIT/ML FlexPen Use on sliding scale between 25- 35 units prior to the meals 30 mL 3  . Insulin Glargine (TOUJEO SOLOSTAR) 300 UNIT/ML SOPN Inject 80 Units into the skin at bedtime. (Patient taking differently: Inject 110 Units into the skin at bedtime. ) 6 pen 3  . leuprolide (LUPRON) 30 MG injection Inject 45 mg into the muscle every 6 (six) months.    Marland Kitchen losartan (COZAAR) 25 MG tablet Take 0.5 tablets (12.5 mg total) by mouth daily. 30 tablet 6  . NITROSTAT 0.4 MG SL  tablet Place 0.4 mg under the tongue as needed.   1  . ONE TOUCH ULTRA TEST test strip USE AS DIRECTED TO TEST BLOOD SUGARS TWICE DAILY 200 each 3  . potassium chloride SA (K-DUR,KLOR-CON) 20 MEQ tablet Take 1 tablet (20 mEq total) by mouth daily as needed (Take one tabley if you take a Lasix tablet). 30 tablet 3   No current facility-administered medications on file prior to visit.   Allergies  Allergen Reactions  . Metformin And Related Nausea Only  . Statins     REACTION: JOINT ACHES   Family History  Problem Relation Age of Onset  . Diabetes Mother 83    DM  . Coronary artery disease Mother   . Hypertension Mother   . Heart attack Mother     CABG with MI in 2005  . Heart disease Mother     CV  . Hypertension Father   . Heart attack Father     CABG with Mi around 1997  . Coronary artery disease Father   . Heart disease Father     CV  . Diabetes Father   . Heart attack Brother     MI/PTCA  . Heart disease Brother     CV  . Diabetes Brother   . Heart attack Maternal Grandfather     MI  . Prostate cancer Paternal Grandfather   . Breast cancer Neg Hx     Breast/ovarian/uterine cancer  . Colon cancer Neg Hx   . Depression Neg Hx   . Alcohol abuse Neg Hx      ETOH/drug abuse  . Stroke Neg Hx   . Diabetes Sister     DM   PE: BP 128/88 mmHg  Pulse 68  Temp(Src) 98.3 F (36.8 C) (Oral)  Resp 16  Ht 5\' 10"  (1.778 m)  Wt 271 lb (122.925 kg)  BMI 38.88 kg/m2  SpO2 95% Wt Readings from Last 3 Encounters:  03/18/15 271 lb (122.925 kg)  03/09/15 260 lb (117.935 kg)  03/07/15 260 lb (117.935 kg)   Constitutional: obese, in NAD Eyes: PERRLA, EOMI, no exophthalmos ENT: moist mucous membranes, no thyromegaly, no cervical lymphadenopathy Cardiovascular: RRR, No MRG Respiratory: CTA B Gastrointestinal: abdomen protuberant, soft, NT, ND, BS+ Musculoskeletal: no deformities, strength intact in all 4 Skin: moist, warm, no rashes Neurological: no tremor with outstretched hands, DTR normal in all 4  ASSESSMENT: 1. DM2, insulin-dependent, uncontrolled, with complications - CAD - s/p stents, CABG - cerebro-vascular ds - s/p CVA   PLAN:  1. Patient with long-standing, uncontrolled diabetes, on large doses of basal and bolus insulin, which became insufficient. Sugars high throughout the day, with occasional mild lows if he is more active. Will try to add Invokana. If not helping, will need U500 insulin at next visit. - We discussed about options for treatment, and I suggested to:  Patient Instructions  Please continue: - Toujeo 110 units at bedtime - Novolog 32-36 units 3x a day, before meals - Tanzeum 50 mg weekly  Add Invokana 100 mg in am, before breakfast.  Please return in 1.5 months with your sugar log (after 05/04/2015). - we discussed about SEs of Invokana, which are: dizziness (advised to be careful when stands from sitting position), decreased BP - usually not < normal (BP today is not low), and fungal UTIs (advised to let me know if develops one).  - given discount card for Invokana - continue checking sugars at different times of the day - check 3  times a day, rotating checks - given sugar log and advised how to fill it and to  bring it at next appt  - given foot care handout and explained the principles  - given instructions for hypoglycemia management "15-15 rule"  - advised for yearly eye exams >> he is UTD - Return to clinic in 1.5 mo with sugar log   - time spent with the patient: 40 min, of which >50% was spent in obtaining information about his DM2, reviewing his previous labs, evaluations, and treatments, counseling him about his condition (please see the discussed topics above), and developing a plan to further investigate it; he had a number of questions which I addressed.

## 2015-03-18 NOTE — Patient Instructions (Signed)
Please continue: - Toujeo 110 units at bedtime - Novolog 32-36 units 3x a day, before meals - Tanzeum 50 mg weekly  Add Invokana 100 mg in am, before breakfast.  Please return in 1.5 months with your sugar log (after 05/04/2015).  PATIENT INSTRUCTIONS FOR TYPE 2 DIABETES:  **Please join MyChart!** - see attached instructions about how to join if you have not done so already.  DIET AND EXERCISE Diet and exercise is an important part of diabetic treatment.  We recommended aerobic exercise in the form of brisk walking (working between 40-60% of maximal aerobic capacity, similar to brisk walking) for 150 minutes per week (such as 30 minutes five days per week) along with 3 times per week performing 'resistance' training (using various gauge rubber tubes with handles) 5-10 exercises involving the major muscle groups (upper body, lower body and core) performing 10-15 repetitions (or near fatigue) each exercise. Start at half the above goal but build slowly to reach the above goals. If limited by weight, joint pain, or disability, we recommend daily walking in a swimming pool with water up to waist to reduce pressure from joints while allow for adequate exercise.    BLOOD GLUCOSES Monitoring your blood glucoses is important for continued management of your diabetes. Please check your blood glucoses 2-4 times a day: fasting, before meals and at bedtime (you can rotate these measurements - e.g. one day check before the 3 meals, the next day check before 2 of the meals and before bedtime, etc.).   HYPOGLYCEMIA (low blood sugar) Hypoglycemia is usually a reaction to not eating, exercising, or taking too much insulin/ other diabetes drugs.  Symptoms include tremors, sweating, hunger, confusion, headache, etc. Treat IMMEDIATELY with 15 grams of Carbs: . 4 glucose tablets .  cup regular juice/soda . 2 tablespoons raisins . 4 teaspoons sugar . 1 tablespoon honey Recheck blood glucose in 15 mins and  repeat above if still symptomatic/blood glucose <100.  RECOMMENDATIONS TO REDUCE YOUR RISK OF DIABETIC COMPLICATIONS: * Take your prescribed MEDICATION(S) * Follow a DIABETIC diet: Complex carbs, fiber rich foods, (monounsaturated and polyunsaturated) fats * AVOID saturated/trans fats, high fat foods, >2,300 mg salt per day. * EXERCISE at least 5 times a week for 30 minutes or preferably daily.  * DO NOT SMOKE OR DRINK more than 1 drink a day. * Check your FEET every day. Do not wear tightfitting shoes. Contact us if you develop an ulcer * See your EYE doctor once a year or more if needed * Get a FLU shot once a year * Get a PNEUMONIA vaccine once before and once after age 38 years  GOALS:  * Your Hemoglobin A1c of <7%  * fasting sugars need to be <130 * after meals sugars need to be <180 (2h after you start eating) * Your Systolic BP should be 517 or lower  * Your Diastolic BP should be 80 or lower  * Your HDL (Good Cholesterol) should be 40 or higher  * Your LDL (Bad Cholesterol) should be 100 or lower. * Your Triglycerides should be 150 or lower  * Your Urine microalbumin (kidney function) should be <30 * Your Body Mass Index should be 25 or lower    Please consider the following ways to cut down carbs and fat and increase fiber and micronutrients in your diet: - substitute whole grain for white bread or pasta - substitute brown rice for white rice - substitute 90-calorie flat bread pieces for slices of bread when  possible - substitute sweet potatoes or yams for white potatoes - substitute humus for margarine - substitute tofu for cheese when possible - substitute almond or rice milk for regular milk (would not drink soy milk daily due to concern for soy estrogen influence on breast cancer risk) - substitute dark chocolate for other sweets when possible - substitute water - can add lemon or orange slices for taste - for diet sodas (artificial sweeteners will trick your body that  you can eat sweets without getting calories and will lead you to overeating and weight gain in the long run) - do not skip breakfast or other meals (this will slow down the metabolism and will result in more weight gain over time)  - can try smoothies made from fruit and almond/rice milk in am instead of regular breakfast - can also try old-fashioned (not instant) oatmeal made with almond/rice milk in am - order the dressing on the side when eating salad at a restaurant (pour less than half of the dressing on the salad) - eat as little meat as possible - can try juicing, but should not forget that juicing will get rid of the fiber, so would alternate with eating raw veg./fruits or drinking smoothies - use as little oil as possible, even when using olive oil - can dress a salad with a mix of balsamic vinegar and lemon juice, for e.g. - use agave nectar, stevia sugar, or regular sugar rather than artificial sweateners - steam or broil/roast veggies  - snack on veggies/fruit/nuts (unsalted, preferably) when possible, rather than processed foods - reduce or eliminate aspartame in diet (it is in diet sodas, chewing gum, etc) Read the labels!  Try to read Dr. Janene Harvey book: "Program for Reversing Diabetes" for other ideas for healthy eating.

## 2015-04-11 ENCOUNTER — Other Ambulatory Visit: Payer: Self-pay

## 2015-04-11 MED ORDER — INSULIN GLARGINE 300 UNIT/ML ~~LOC~~ SOPN: 110.0000 [IU] | PEN_INJECTOR | Freq: Every day | SUBCUTANEOUS | Status: DC

## 2015-04-11 NOTE — Telephone Encounter (Signed)
Patient used to see Dr. Howell Rucks, please advise as she last saw on 02/01/15 and it looks like Dr. Cruzita Lederer saw patient on 03/18/15.  Thanks

## 2015-05-06 ENCOUNTER — Ambulatory Visit (INDEPENDENT_AMBULATORY_CARE_PROVIDER_SITE_OTHER): Payer: BLUE CROSS/BLUE SHIELD | Admitting: Internal Medicine

## 2015-05-06 ENCOUNTER — Encounter: Payer: Self-pay | Admitting: Internal Medicine

## 2015-05-06 VITALS — BP 110/64 | HR 68 | Temp 97.5°F | Ht 69.0 in | Wt 265.0 lb

## 2015-05-06 DIAGNOSIS — E1165 Type 2 diabetes mellitus with hyperglycemia: Secondary | ICD-10-CM

## 2015-05-06 DIAGNOSIS — E1151 Type 2 diabetes mellitus with diabetic peripheral angiopathy without gangrene: Secondary | ICD-10-CM | POA: Diagnosis not present

## 2015-05-06 DIAGNOSIS — E1159 Type 2 diabetes mellitus with other circulatory complications: Secondary | ICD-10-CM

## 2015-05-06 LAB — BASIC METABOLIC PANEL
BUN: 18 mg/dL (ref 6–23)
CO2: 27 mEq/L (ref 19–32)
Calcium: 9.4 mg/dL (ref 8.4–10.5)
Chloride: 102 mEq/L (ref 96–112)
Creatinine, Ser: 0.99 mg/dL (ref 0.40–1.50)
GFR: 80.93 mL/min (ref >60.00)
Glucose, Bld: 155 mg/dL — ABNORMAL HIGH (ref 70–99)
Potassium: 4.1 mEq/L (ref 3.5–5.1)
Sodium: 138 mEq/L (ref 135–145)

## 2015-05-06 LAB — HEMOGLOBIN A1C: Hgb A1c MFr Bld: 7.1 % — ABNORMAL HIGH (ref 4.6–6.5)

## 2015-05-06 MED ORDER — INSULIN GLARGINE 300 UNIT/ML ~~LOC~~ SOPN: 80.0000 [IU] | PEN_INJECTOR | Freq: Every day | SUBCUTANEOUS | Status: DC

## 2015-05-06 NOTE — Patient Instructions (Signed)
Please continue: - Invokana 100 mg daily - Tanzeum 50 mg weekly  Please decrease: - Toujeo to 80 units at bedtime - Novolog 28 units 3x a day, before meals  Please stop at the lab.  Please come back for a follow-up appointment in 3 months with your sugar log.

## 2015-05-06 NOTE — Progress Notes (Signed)
Patient ID: Phillip Clements, male   DOB: [birth year], 64 y.o.   MRN: [id number]  HPI: Phillip Clements is a 64 y.o.-year-old male, returning for f/u for DM2, dx in 1996, insulin-dependent since 2008, uncontrolled, with complications (CAD - s/p stents, CABG, cerebro-vascular ds - s/p CVA). Last visit 1.5 mo ago.  Last hemoglobin A1c was: Lab Results  Component Value Date   HGBA1C 8.5* 02/01/2015   HGBA1C 9.1* 10/21/2014   HGBA1C 9.6* 04/22/2014   Pt is on a regimen of: - Toujeo 110 units at bedtime - Novolog 27 >> 32-36 units 3x a day, before meals + SSI (forgets 2-3x a week) - Tanzeum 50 mg weekly - Invokana 100 mg daily - added 03/2015 He tried Victoza and Januvia Had GI intolerance (nausea) to Metformin.  Pt checks his sugars 1-3x a day and they are [facility] BETTER after adding Invokana: - am: 173-250 >> 60-120 - 2h after b'fast: n/c - before lunch: 77, 82, 113-221, 340 >> 94-130 - 2h after lunch: n/c - before dinner: 159, 167 >> 94-130 - 2h after dinner: n/c - bedtime: 198, 254 >> 120 - nighttime: n/c No lows. Lowest sugar was 77 >> 60s; he has hypoglycemia awareness at 70s.  Highest sugar was 340 >> 220x1.  Glucometer: OneTouch Ultra mini  Pt's meals are: - Breakfast: thin bagel + cream cheese + special K cinnamon cereal - Lunch: sandwich, chips, cottage cheese, pineapple - Dinner: meat + 2 veggies, salads - Snacks: 2/day: celery + PB; low salt triscuit + laughing cow cheese  Exercise: cardio + weights 2x a day; golf on wed.  - no CKD, last BUN/creatinine:  Lab Results  Component Value Date   BUN 17 10/21/2014   CREATININE 0.88 10/21/2014  On Losartan - last set of lipids: Lab Results  Component Value Date   CHOL 170 07/20/2014   HDL 32* 07/20/2014   LDLCALC 116* 07/20/2014   LDLDIRECT 157.1 09/07/2013   TRIG 108 07/20/2014   CHOLHDL 5.3 07/20/2014  Statins >> joint pain.  - last eye exam was in 10/04/2014. [facility]. No DR. Had cataract sx. - no  numbness and tingling in his feet.  ROS: Constitutional: no weight gain/loss, no fatigue, no subjective hyperthermia/hypothermia, + nocturia Eyes: no blurry vision, no xerophthalmia ENT: no sore throat, no nodules palpated in throat, no dysphagia/odynophagia, no hoarseness Cardiovascular: no CP/SOB/palpitations/leg swelling Respiratory: no cough/SOB Gastrointestinal: no N/V/D/C Musculoskeletal: no muscle/joint aches Skin: no rashes, + easy bruising Neurological: no tremors/numbness/tingling/dizziness  I reviewed pt's medications, allergies, PMH, social hx, family hx, and changes were documented in the history of present illness. Otherwise, unchanged from my initial visit note:  Past Medical History  Diagnosis Date  . Hypertension 2000  . Coronary artery disease     a. CABG 04/2014: (LIMA--> LAD, SVG --> DIAG, SVG--> OM1, SVG--> PDA)  b. early graft failure. s/p successful rotational atherectomy and stenting of the left main and proximal LAD with DES. DES of mid LAD.(07/22/14)  . Hyperlipidemia   . OSA on CPAP   . Type II diabetes mellitus   . CVA (cerebral infarction)   . Prostate cancer    Past Surgical History  Procedure Laterality Date  . Prostate biopsy  04/03/06 & 02/25/07  . High intense focused ultrasound  07/20/06    By Dr. Jacqlyn Larsen  . Cytoscopy prostatic stone o/w nml  06/21/08    Dr. Jacqlyn Larsen  . Ett myoview  09/13/09    Low risk EF 49%  .  Anterior cervical decomp/discectomy fusion  08/25/2012    Procedure: ANTERIOR CERVICAL DECOMPRESSION/DISCECTOMY FUSION 2 LEVELS;  Surgeon: Hosie Spangle, MD;  Location: [place] NEURO ORS;  Service: Neurosurgery;  Laterality: N/A;  Cervical five-six Cervical six-seven anterior cervical decompression with fusion and plating and bonegraft  . Intraoperative transesophageal echocardiogram N/A 04/26/2014    Procedure: INTRAOPERATIVE TRANSESOPHAGEAL ECHOCARDIOGRAM;  Surgeon: Ivin Poot, MD;  Location: [place];  Service: Open Heart Surgery;  Laterality:  N/A;  . Coronary artery bypass graft N/A 04/26/2014    Procedure: CORONARY ARTERY BYPASS GRAFTING (CABG), on pump, times four, using left internal mammary artery, right greater saphenous vein harvested endoscopically.;  Surgeon: Ivin Poot, MD;  Location: [place];  Service: Open Heart Surgery;  Laterality: N/A;  LIMA to LAD, SVG to DIAGONAL, SVG to OM1, SVG to PDA) with EVH of the RIGHT THIGH and LOWER EXTREMITY SAPHENOUS VEIN  . Back surgery    . Percutaneous coronary rotoblator intervention (pci-r) N/A 07/22/2014    Procedure: PERCUTANEOUS CORONARY ROTOBLATOR INTERVENTION (PCI-R);  Surgeon: Peter M Martinique, MD;  Location: [facility] C[facility] LAB;  Service: Cardiovascular;  Laterality: N/A;  . Cardiac catheterization  04/2014; 07/19/2014   Social History   Social History  . Marital Status: Married    Spouse Name: N/A  . Number of Children: 3  . Years of Education: N/A   Occupational History  . Insurance account manager    Social History Main Topics  . Smoking status: Former Smoker -- 1.00 packs/day for 36 years    Types: Cigarettes    Quit date: 03/01/2014  . Smokeless tobacco: Never Used  . Alcohol Use: Yes     Comment: 07/19/2014 "might have a drink a couple times/yr"  . Drug Use: No  . Sexual Activity: Not Currently   Other Topics Concern  . Not on file   Social History Narrative   Lives with wife.   2 daughters and 1 son.   [employer] fan   Runs [employer]   Current Outpatient Prescriptions on File Prior to Visit  Medication Sig Dispense Refill  . Albiglutide 50 MG PEN Inject 50 mg into the skin once a week. 4 each 3  . aspirin EC 81 MG tablet Take 1 tablet (81 mg total) by mouth daily. 90 tablet 3  . B-D ULTRAFINE III SHORT PEN 31G X 8 MM MISC USE AS DIRECTED TWICE DAILY 100 each 11  . bicalutamide (CASODEX) 50 MG tablet Take 50 mg by mouth daily.    . canagliflozin (INVOKANA) 100 MG TABS tablet Take 1 tablet (100 mg total) by mouth daily. 30 tablet 2  .  carvedilol (COREG) 3.125 MG tablet Take 1 tablet (3.125 mg total) by mouth 2 (two) times daily with a meal. 60 tablet 6  . clopidogrel (PLAVIX) 75 MG tablet Take 1 tablet (75 mg total) by mouth daily with breakfast. 30 tablet 11  . insulin aspart (NOVOLOG FLEXPEN) 100 UNIT/ML FlexPen Use on sliding scale between 25- 35 units prior to the meals 30 mL 3  . Insulin Glargine (TOUJEO SOLOSTAR) 300 UNIT/ML SOPN Inject 110 Units into the skin at bedtime. 6 pen 3  . leuprolide (LUPRON) 30 MG injection Inject 45 mg into the muscle every 6 (six) months.    Marland Kitchen losartan (COZAAR) 25 MG tablet Take 0.5 tablets (12.5 mg total) by mouth daily. 30 tablet 6  . NITROSTAT 0.4 MG SL tablet Place 0.4 mg under the tongue as needed.   1  .  ONE TOUCH ULTRA TEST test strip USE AS DIRECTED TO TEST BLOOD SUGARS TWICE DAILY 200 each 3  . ferrous sulfate 325 (65 FE) MG tablet Take 1 tablet (325 mg total) by mouth daily with breakfast. For one month then stop. (Patient not taking: Reported on 05/06/2015)  3  . furosemide (LASIX) 40 MG tablet Take 1 tablet (40 mg total) by mouth daily as needed for fluid or edema. (Patient not taking: Reported on 05/06/2015) 30 tablet 3  . potassium chloride SA (K-DUR,KLOR-CON) 20 MEQ tablet Take 1 tablet (20 mEq total) by mouth daily as needed (Take one tabley if you take a Lasix tablet). (Patient not taking: Reported on 05/06/2015) 30 tablet 3   No current facility-administered medications on file prior to visit.   Allergies  Allergen Reactions  . Metformin And Related Nausea Only  . Statins     REACTION: JOINT ACHES   Family History  Problem Relation Age of Onset  . Diabetes Mother 85    DM  . Coronary artery disease Mother   . Hypertension Mother   . Heart attack Mother     CABG with MI in 2005  . Heart disease Mother     CV  . Hypertension Father   . Heart attack Father     CABG with Mi around 1997  . Coronary artery disease Father   . Heart disease Father     CV  . Diabetes  Father   . Heart attack Brother     MI/PTCA  . Heart disease Brother     CV  . Diabetes Brother   . Heart attack Maternal Grandfather     MI  . Prostate cancer Paternal Grandfather   . Breast cancer Neg Hx     Breast/ovarian/uterine cancer  . Colon cancer Neg Hx   . Depression Neg Hx   . Alcohol abuse Neg Hx     ETOH/drug abuse  . Stroke Neg Hx   . Diabetes Sister     DM   PE: BP 110/64 mmHg  Pulse 68  Temp(Src) 97.5 F (36.4 C) (Oral)  Ht 5\' 9"  (1.753 m)  Wt 265 lb (120.203 kg)  BMI 39.12 kg/m2  SpO2 97% Wt Readings from Last 3 Encounters:  05/06/15 265 lb (120.203 kg)  03/18/15 271 lb (122.925 kg)  03/09/15 260 lb (117.935 kg)   Constitutional: obese, in NAD Eyes: PERRLA, EOMI, no exophthalmos ENT: moist mucous membranes, no thyromegaly, no cervical lymphadenopathy Cardiovascular: RRR, No MRG Respiratory: CTA B Gastrointestinal: abdomen protuberant, soft, NT, ND, BS+ Musculoskeletal: no deformities, strength intact in all 4 Skin: moist, warm, no rashes Neurological: no tremor with outstretched hands, DTR normal in all 4  ASSESSMENT: 1. DM2, insulin-dependent, uncontrolled, with complications - CAD - s/p stents, CABG - cerebro-vascular ds - s/p CVA   PLAN:  1. Patient with long-standing, uncontrolled diabetes, on basal - bolus insulin regimen + GLP1 R agonist and added SGLT2 inhibitor at last visit >> impressive improvement in DM control. As he has occasional low sugars especially in am and he misses Novolog if sugars <100, will decrease Toujeo and NovoLog. - I suggested to:  Patient Instructions  Please continue: - Invokana 100 mg daily - Tanzeum 50 mg weekly  Please decrease: - Toujeo to 80 units at bedtime - Novolog 28 units 3x a day, before meals  Please stop at the lab.  Please come back for a follow-up appointment in 3 months with your sugar log.  - continue  checking sugars at different times of the day - check 3 times a day, rotating checks -  advised for yearly eye exams >> he is UTD - will check BMP and HbA1c today - Return to clinic in 3 mo with sugar log >> will check Lipids then   Office Visit on 05/06/2015  Component Date Value Ref Range Status  . Sodium 05/06/2015 138  135 - 145 mEq/L Final  . Potassium 05/06/2015 4.1  3.5 - 5.1 mEq/L Final  . Chloride 05/06/2015 102  96 - 112 mEq/L Final  . CO2 05/06/2015 27  19 - 32 mEq/L Final  . Glucose, Bld 05/06/2015 155* 70 - 99 mg/dL Final  . BUN 05/06/2015 18  6 - 23 mg/dL Final  . Creatinine, Ser 05/06/2015 0.99  0.40 - 1.50 mg/dL Final  . Calcium 05/06/2015 9.4  8.4 - 10.5 mg/dL Final  . GFR 05/06/2015 80.93  >60.00 mL/min Final  . Hgb A1c MFr Bld 05/06/2015 7.1* 4.6 - 6.5 % Final   Glycemic Control Guidelines for People with Diabetes:Non Diabetic:  <6%Goal of Therapy: <7%Additional Action Suggested:  >8%    Excellent hemoglobin A1c decrease, despite relatively recent addition of Invokana! Normal GFR and potassium.

## 2015-05-12 ENCOUNTER — Telehealth: Payer: Self-pay | Admitting: *Deleted

## 2015-05-12 ENCOUNTER — Ambulatory Visit (INDEPENDENT_AMBULATORY_CARE_PROVIDER_SITE_OTHER): Payer: BLUE CROSS/BLUE SHIELD | Admitting: Podiatry

## 2015-05-12 DIAGNOSIS — R0989 Other specified symptoms and signs involving the circulatory and respiratory systems: Secondary | ICD-10-CM

## 2015-05-12 DIAGNOSIS — B351 Tinea unguium: Secondary | ICD-10-CM

## 2015-05-12 DIAGNOSIS — L600 Ingrowing nail: Secondary | ICD-10-CM

## 2015-05-12 DIAGNOSIS — M79676 Pain in unspecified toe(s): Secondary | ICD-10-CM | POA: Diagnosis not present

## 2015-05-12 NOTE — Progress Notes (Signed)
Subjective: 64 y.o. returns the office today for painful, elongated, thickened toenails which he is unable to trim himself. He also continues to states his right big toenails become more ingrown and causing pain. He denies any redness or drainage from the nail. Denies any redness or drainage around the other nails. Denies any acute changes since last appointment and no new complaints today. Denies any systemic complaints such as fevers, chills, nausea, vomiting. His last A1c was 7.1.  Objective: AAO 3, NAD DP/PT pulses decreased, CRT less than 3 seconds Protective sensation decreased with Simms Weinstein monofilament Nails hypertrophic, dystrophic, elongated, brittle, discolored 10. There is tenderness overlying the nails 1-5 bilaterally. There is no surrounding erythema or drainage along the nail sites. There is incurvation of both the medial and lateral borders of the right hallux toenail. There is tenderness palpation along this area. There is no surrounding erythema, edema, drainage/purulence. No clinical signs of infection. On the plantar aspect of the left foot there is dry, xerotic skin without any bleeding or fissuring. No open lesions or pre-ulcerative lesions are identified. No other areas of tenderness bilateral lower extremities. No overlying edema, erythema, increased warmth. No pain with calf compression, swelling, warmth, erythema.  Assessment: Patient presents with symptomatic onychomycosis  Plan: -Treatment options including alternatives, risks, complications were discussed -Nails sharply debrided 10 without complication/bleeding. -Given decreased pulses will obtain vascular studies prior to the nail avulsion. Discussed total nail avulsion pending vascular studies. Monitor for signs or symptoms of infection and directed to call the office sooner should any occur. -Discussed daily foot inspection. If there are any changes, to call the office immediately.  -Follow-up after  vascular studies or sooner if any problems are to arise. In the meantime, encouraged to call the office with any questions, concerns, changes symptoms.  Celesta Gentile, DPM

## 2015-05-12 NOTE — Telephone Encounter (Signed)
-----   Message from Trula Slade, DPM sent at 05/12/2015  9:48 AM EDT ----- Can we order arterial studies for decreased pulses? thanks

## 2015-05-30 LAB — HM DIABETES EYE EXAM

## 2015-05-31 ENCOUNTER — Telehealth: Payer: Self-pay | Admitting: *Deleted

## 2015-05-31 DIAGNOSIS — R0989 Other specified symptoms and signs involving the circulatory and respiratory systems: Secondary | ICD-10-CM

## 2015-05-31 NOTE — Telephone Encounter (Addendum)
-----   Message from Trula Slade, DPM sent at 05/31/2015  8:09 AM EDT ----- Can you please order a vascular consult for him for [facility]? He has abnormal studies. Please let him know as well. Thank you. Informed pt of Dr. Leigh Aurora referral and gave [facility]'s [phone].  Faxed referral.

## 2015-06-08 ENCOUNTER — Other Ambulatory Visit: Payer: Self-pay | Admitting: Endocrinology

## 2015-06-10 ENCOUNTER — Encounter: Payer: Self-pay | Admitting: Internal Medicine

## 2015-06-10 ENCOUNTER — Telehealth: Payer: Self-pay | Admitting: Internal Medicine

## 2015-06-10 MED ORDER — INSULIN ASPART 100 UNIT/ML FLEXPEN: 28.0000 [IU] | PEN_INJECTOR | Freq: Three times a day (TID) | SUBCUTANEOUS | Status: DC

## 2015-06-10 NOTE — Telephone Encounter (Signed)
Refill sent to pt's pharmacy. 

## 2015-06-10 NOTE — Telephone Encounter (Signed)
Patient called stating that he would like a refill on his Rx   [id number]   Pharmacy: [facility]   Thank you

## 2015-06-11 ENCOUNTER — Inpatient Hospital Stay (HOSPITAL_COMMUNITY)
Admission: RE | Admit: 2015-06-11 | Discharge: 2015-06-11 | Disposition: A | Discharge status: Home / Self Care | Payer: BLUE CROSS/BLUE SHIELD | Source: Ambulatory Visit | Attending: Podiatry | Admitting: Podiatry

## 2015-06-11 ENCOUNTER — Inpatient Hospital Stay (HOSPITAL_COMMUNITY): Admission: RE | Admit: 2015-06-11 | Payer: BLUE CROSS/BLUE SHIELD | Source: Ambulatory Visit

## 2015-06-11 DIAGNOSIS — R0989 Other specified symptoms and signs involving the circulatory and respiratory systems: Secondary | ICD-10-CM

## 2015-06-13 ENCOUNTER — Other Ambulatory Visit: Payer: Self-pay | Admitting: Internal Medicine

## 2015-06-16 ENCOUNTER — Other Ambulatory Visit: Payer: Self-pay | Admitting: *Deleted

## 2015-06-16 MED ORDER — ALBIGLUTIDE 50 MG ~~LOC~~ PEN: 50.0000 mg | PEN_INJECTOR | SUBCUTANEOUS | Status: DC

## 2015-06-23 ENCOUNTER — Other Ambulatory Visit: Payer: Self-pay

## 2015-06-23 DIAGNOSIS — R079 Chest pain, unspecified: Secondary | ICD-10-CM

## 2015-06-23 DIAGNOSIS — E785 Hyperlipidemia, unspecified: Secondary | ICD-10-CM

## 2015-06-23 DIAGNOSIS — I2510 Atherosclerotic heart disease of native coronary artery without angina pectoris: Secondary | ICD-10-CM

## 2015-06-23 DIAGNOSIS — I10 Essential (primary) hypertension: Secondary | ICD-10-CM

## 2015-06-23 MED ORDER — CARVEDILOL 3.125 MG PO TABS
3.1250 mg | ORAL_TABLET | Freq: Two times a day (BID) | ORAL | Status: DC
Start: 2015-06-23 — End: 2015-07-27

## 2015-06-23 NOTE — Telephone Encounter (Signed)
Hypertension - Phillip Hampshire, MD at 11/01/2014 10:02 AM     Status: Written Related Problem: Hypertension   Expand All Collapse All   Given that he is diabetic, I switched him from metoprolol to carvedilol. He is having dry cough with lisinopril. Switched to losartan 12.5 mg once daily.       Start Carvedilol (Coreg) 3.125 mg twice daily

## 2015-07-12 ENCOUNTER — Other Ambulatory Visit: Payer: Self-pay | Admitting: Vascular Surgery

## 2015-07-13 ENCOUNTER — Encounter: Payer: Self-pay | Admitting: Cardiothoracic Surgery

## 2015-07-13 ENCOUNTER — Ambulatory Visit (INDEPENDENT_AMBULATORY_CARE_PROVIDER_SITE_OTHER): Payer: BLUE CROSS/BLUE SHIELD | Admitting: Cardiothoracic Surgery

## 2015-07-13 VITALS — BP 131/68 | HR 69 | Resp 16 | Ht 69.0 in | Wt 260.0 lb

## 2015-07-13 DIAGNOSIS — I739 Peripheral vascular disease, unspecified: Secondary | ICD-10-CM | POA: Diagnosis not present

## 2015-07-13 DIAGNOSIS — I25119 Atherosclerotic heart disease of native coronary artery with unspecified angina pectoris: Secondary | ICD-10-CM | POA: Diagnosis not present

## 2015-07-13 DIAGNOSIS — Z951 Presence of aortocoronary bypass graft: Secondary | ICD-10-CM | POA: Diagnosis not present

## 2015-07-13 NOTE — Progress Notes (Signed)
PCP is Elsie Stain, MD Referring Provider is Wellington Hampshire, MD  Chief Complaint  Patient presents with  . Routine Post Op    4 month f/u s/p CABG X 4 04/26/14.Marland KitchenMarland KitchenTO HAVE LEFT LEG BYPASS 07/26/15 @ [place]    HPI: No cardiac complaints after CABG x4 with SVG closure and subsequent PCI Having claudication L>R and will undergo angiogram [facility] Cont trouble with DM, obesity  Past Medical History  Diagnosis Date  . Hypertension 2000  . Coronary artery disease     a. CABG 04/2014: (LIMA--> LAD, SVG --> DIAG, SVG--> OM1, SVG--> PDA)  b. early graft failure. s/p successful rotational atherectomy and stenting of the left main and proximal LAD with DES. DES of mid LAD.(07/22/14)  . Hyperlipidemia   . OSA on CPAP   . Type II diabetes mellitus ([place])   . CVA (cerebral infarction)   . Prostate cancer [facility])     Past Surgical History  Procedure Laterality Date  . Prostate biopsy  04/03/06 & 02/25/07  . High intense focused ultrasound  07/20/06    By Dr. Jacqlyn Larsen  . Cytoscopy prostatic stone o/w nml  06/21/08    Dr. Jacqlyn Larsen  . Ett myoview  09/13/09    Low risk EF 49%  . Anterior cervical decomp/discectomy fusion  08/25/2012    Procedure: ANTERIOR CERVICAL DECOMPRESSION/DISCECTOMY FUSION 2 LEVELS;  Surgeon: Hosie Spangle, MD;  Location: [place] NEURO ORS;  Service: Neurosurgery;  Laterality: N/A;  Cervical five-six Cervical six-seven anterior cervical decompression with fusion and plating and bonegraft  . Intraoperative transesophageal echocardiogram N/A 04/26/2014    Procedure: INTRAOPERATIVE TRANSESOPHAGEAL ECHOCARDIOGRAM;  Surgeon: Ivin Poot, MD;  Location: [place];  Service: Open Heart Surgery;  Laterality: N/A;  . Coronary artery bypass graft N/A 04/26/2014    Procedure: CORONARY ARTERY BYPASS GRAFTING (CABG), on pump, times four, using left internal mammary artery, right greater saphenous vein harvested endoscopically.;  Surgeon: Ivin Poot, MD;  Location: [place];  Service: Open Heart Surgery;   Laterality: N/A;  LIMA to LAD, SVG to DIAGONAL, SVG to OM1, SVG to PDA) with EVH of the RIGHT THIGH and LOWER EXTREMITY SAPHENOUS VEIN  . Back surgery    . Percutaneous coronary rotoblator intervention (pci-r) N/A 07/22/2014    Procedure: PERCUTANEOUS CORONARY ROTOBLATOR INTERVENTION (PCI-R);  Surgeon: Peter M Martinique, MD;  Location: [facility] CATH LAB;  Service: Cardiovascular;  Laterality: N/A;  . Cardiac catheterization  04/2014; 07/19/2014    Family History  Problem Relation Age of Onset  . Diabetes Mother 5    DM  . Coronary artery disease Mother   . Hypertension Mother   . Heart attack Mother     CABG with MI in 2005  . Heart disease Mother     CV  . Hypertension Father   . Heart attack Father     CABG with Mi around 1997  . Coronary artery disease Father   . Heart disease Father     CV  . Diabetes Father   . Heart attack Brother     MI/PTCA  . Heart disease Brother     CV  . Diabetes Brother   . Heart attack Maternal Grandfather     MI  . Prostate cancer Paternal Grandfather   . Breast cancer Neg Hx     Breast/ovarian/uterine cancer  . Colon cancer Neg Hx   . Depression Neg Hx   . Alcohol abuse Neg Hx     ETOH/drug abuse  . Stroke  Neg Hx   . Diabetes Sister     DM    Social History Social History  Substance Use Topics  . Smoking status: Former Smoker -- 1.00 packs/day for 36 years    Types: Cigarettes    Quit date: 03/01/2014  . Smokeless tobacco: Never Used  . Alcohol Use: Yes     Comment: 07/19/2014 "might have a drink a couple times/yr"    Current Outpatient Prescriptions  Medication Sig Dispense Refill  . Albiglutide 50 MG PEN Inject 50 mg into the skin once a week. 4 each 3  . aspirin EC 81 MG tablet Take 1 tablet (81 mg total) by mouth daily. 90 tablet 3  . B-D ULTRAFINE III SHORT PEN 31G X 8 MM MISC USE AS DIRECTED TWICE DAILY 100 each 11  . bicalutamide (CASODEX) 50 MG tablet Take 50 mg by mouth daily.    . carvedilol (COREG) 3.125 MG tablet Take 1  tablet (3.125 mg total) by mouth 2 (two) times daily with a meal. 60 tablet 0  . clopidogrel (PLAVIX) 75 MG tablet Take 1 tablet (75 mg total) by mouth daily with breakfast. 30 tablet 11  . insulin aspart (NOVOLOG FLEXPEN) 100 UNIT/ML FlexPen Inject 28 Units into the skin 3 (three) times daily with meals. 25 mL 3  . Insulin Glargine (TOUJEO SOLOSTAR) 300 UNIT/ML SOPN Inject 80 Units into the skin at bedtime. 6 pen 3  . INVOKANA 100 MG TABS tablet TAKE ONE TABLET BY MOUTH DAILY 30 tablet 2  . leuprolide (LUPRON) 30 MG injection Inject 45 mg into the muscle every 6 (six) months.    Marland Kitchen losartan (COZAAR) 25 MG tablet Take 0.5 tablets (12.5 mg total) by mouth daily. 30 tablet 6  . NITROSTAT 0.4 MG SL tablet Place 0.4 mg under the tongue as needed.   1  . ONE TOUCH ULTRA TEST test strip USE AS DIRECTED TO TEST BLOOD SUGARS TWICE DAILY 200 each 3  . furosemide (LASIX) 40 MG tablet Take 1 tablet (40 mg total) by mouth daily as needed for fluid or edema. (Patient not taking: Reported on 05/06/2015) 30 tablet 3  . potassium chloride SA (K-DUR,KLOR-CON) 20 MEQ tablet Take 1 tablet (20 mEq total) by mouth daily as needed (Take one tabley if you take a Lasix tablet). (Patient not taking: Reported on 05/06/2015) 30 tablet 3   No current facility-administered medications for this visit.    Allergies  Allergen Reactions  . Metformin And Related Nausea Only  . Statins     REACTION: JOINT ACHES    Review of Systems  Wt up No DOE Walks treadmill 30 min 2 days/week bilat calf claudication  BP 131/68 mmHg  Pulse 69  Resp 16  Ht 5\' 9"  (1.753 m)  Wt 260 lb (117.935 kg)  BMI 38.38 kg/m2  SpO2 98% Physical Exam     Physical Exam  General: comfortable, NAD HEENT: Normocephalic pupils equal , dentition adequate Neck: Supple without JVD, adenopathy, or bruit Chest: Clear to auscultation, symmetrical breath sounds, no rhonchi, no tenderness             or deformity Cardiovascular: Regular rate and  rhythm, no murmur, no gallop, peripheral pulses             not palpable in  lower extremities Abdomen:  Soft, nontender, no palpable mass or organomegaly Extremities: Warm, well-perfused, no clubbing cyanosis edema or tenderness,              no  venous stasis changes of the legs Rectal/GU: Deferred Neuro: Grossly non--focal and symmetrical throughout Skin: Clean and dry without rash or ulceration   Diagnostic Tests: Results of lower extremity doppler  U/S reviewed Impression: No evidence of angina SFA disease L>R Plan: Cont plavix Encouraged to increase walking sessions Return for review in 4 months  Len Childs, MD [facility] [phone]

## 2015-07-15 ENCOUNTER — Ambulatory Visit: Payer: BLUE CROSS/BLUE SHIELD | Admitting: Nurse Practitioner

## 2015-07-22 ENCOUNTER — Ambulatory Visit (INDEPENDENT_AMBULATORY_CARE_PROVIDER_SITE_OTHER): Payer: BLUE CROSS/BLUE SHIELD | Admitting: Cardiovascular Disease

## 2015-07-22 ENCOUNTER — Encounter: Payer: Self-pay | Admitting: Cardiovascular Disease

## 2015-07-22 VITALS — BP 112/62 | HR 65 | Ht 69.0 in | Wt 271.5 lb

## 2015-07-22 DIAGNOSIS — E785 Hyperlipidemia, unspecified: Secondary | ICD-10-CM | POA: Diagnosis not present

## 2015-07-22 DIAGNOSIS — I25700 Atherosclerosis of coronary artery bypass graft(s), unspecified, with unstable angina pectoris: Secondary | ICD-10-CM

## 2015-07-22 DIAGNOSIS — I739 Peripheral vascular disease, unspecified: Secondary | ICD-10-CM

## 2015-07-22 DIAGNOSIS — I2510 Atherosclerotic heart disease of native coronary artery without angina pectoris: Secondary | ICD-10-CM | POA: Diagnosis not present

## 2015-07-22 DIAGNOSIS — I4891 Unspecified atrial fibrillation: Secondary | ICD-10-CM

## 2015-07-22 DIAGNOSIS — I9789 Other postprocedural complications and disorders of the circulatory system, not elsewhere classified: Secondary | ICD-10-CM

## 2015-07-22 DIAGNOSIS — I10 Essential (primary) hypertension: Secondary | ICD-10-CM

## 2015-07-22 NOTE — Assessment & Plan Note (Signed)
Blood pressure is well controlled on current medications. 

## 2015-07-22 NOTE — Assessment & Plan Note (Signed)
The patient is doing reasonably well with no evidence of recurrent angina. I discussed with him the importance of controlling his risk factors. I recommend dual antiplatelet therapy indefinitely.

## 2015-07-22 NOTE — Patient Instructions (Signed)
Medication Instructions:  Your physician recommends that you continue on your current medications as directed. Please refer to the Current Medication list given to you today.   Labwork: Fasting liver and lipid profile  Testing/Procedures: none  Follow-Up: Your physician wants you to follow-up in: 6 months with Dr. Fletcher Anon.  You will receive a reminder letter in the mail two months in advance. If you don't receive a letter, please call our office to schedule the follow-up appointment.   Any Other Special Instructions Will Be Listed Below (If Applicable).     If you need a refill on your cardiac medications before your next appointment, please call your pharmacy.

## 2015-07-22 NOTE — Progress Notes (Signed)
Primary care physician: Dr. Damita Dunnings  HPI  This is a pleasant 64 year old man who is here today for followup visit regarding coronary artery disease .  He has known history of diabetes for at least 25 years of duration which has not been optimally controlled. He also has other medical conditions that include  Hyperlipidemia, tobacco use, obesity and prostate cancer. He was seen in September, 2015 for new onset progressive angina .Cardiac catheterization showed severe three-vessel and moderate left main disease. Ejection fraction was 45%. [facility] with LIMA to LAD, SVG to DIAGONAL, SVG to OM1, SVG to PDA.   He developed recurrent angina  in November. He underwent a nuclear stress test which was abnormal. He had inferolateral ST elevation with exercise. Nuclear imaging showed evidence of inferior as well as anterior ischemia. Repeat cardiac catheterization showed occluded grafts except for SVG to OM 2. LIMA to LAD was atretic. Ejection fraction was 55%. He underwent high-risk atherectomy of the left main and LAD with stenting of the mid and ostial LAD . His angina resolved since then and he had no recurrent symptoms. He attended cardiac rehabilitation. He was recently diagnosed with peripheral arterial disease with bilateral calf claudication. He is going to have an angiogram done by Dr. Ronalee Belts in the near future. The patient stopped taking Crestor due to joint pain and aching. He was only on 5 mg. He reports trying atorvastatin in the past with similar symptoms.     Allergies  Allergen Reactions  . Metformin And Related Nausea Only  . Statins     REACTION: JOINT ACHES     Current Outpatient Prescriptions on File Prior to Visit  Medication Sig Dispense Refill  . Albiglutide 50 MG PEN Inject 50 mg into the skin once a week. 4 each 3  . aspirin EC 81 MG tablet Take 1 tablet (81 mg total) by mouth daily. 90 tablet 3  . B-D ULTRAFINE III SHORT PEN 31G X 8 MM MISC USE AS DIRECTED  TWICE DAILY 100 each 11  . bicalutamide (CASODEX) 50 MG tablet Take 50 mg by mouth daily.    . carvedilol (COREG) 3.125 MG tablet Take 1 tablet (3.125 mg total) by mouth 2 (two) times daily with a meal. 60 tablet 0  . clopidogrel (PLAVIX) 75 MG tablet Take 1 tablet (75 mg total) by mouth daily with breakfast. 30 tablet 11  . furosemide (LASIX) 40 MG tablet Take 1 tablet (40 mg total) by mouth daily as needed for fluid or edema. 30 tablet 3  . insulin aspart (NOVOLOG FLEXPEN) 100 UNIT/ML FlexPen Inject 28 Units into the skin 3 (three) times daily with meals. 25 mL 3  . Insulin Glargine (TOUJEO SOLOSTAR) 300 UNIT/ML SOPN Inject 80 Units into the skin at bedtime. 6 pen 3  . INVOKANA 100 MG TABS tablet TAKE ONE TABLET BY MOUTH DAILY 30 tablet 2  . leuprolide (LUPRON) 30 MG injection Inject 45 mg into the muscle every 6 (six) months.    Marland Kitchen losartan (COZAAR) 25 MG tablet Take 0.5 tablets (12.5 mg total) by mouth daily. 30 tablet 6  . NITROSTAT 0.4 MG SL tablet Place 0.4 mg under the tongue as needed.   1  . ONE TOUCH ULTRA TEST test strip USE AS DIRECTED TO TEST BLOOD SUGARS TWICE DAILY 200 each 3  . potassium chloride SA (K-DUR,KLOR-CON) 20 MEQ tablet Take 1 tablet (20 mEq total) by mouth daily as needed (Take one tabley if you take a Lasix  tablet). 30 tablet 3   No current facility-administered medications on file prior to visit.     Past Medical History  Diagnosis Date  . Hypertension 2000  . Coronary artery disease     a. CABG 04/2014: (LIMA--> LAD, SVG --> DIAG, SVG--> OM1, SVG--> PDA)  b. early graft failure. s/p successful rotational atherectomy and stenting of the left main and proximal LAD with DES. DES of mid LAD.(07/22/14)  . Hyperlipidemia   . OSA on CPAP   . Type II diabetes mellitus ([place])   . CVA (cerebral infarction)   . Prostate cancer [facility])      Past Surgical History  Procedure Laterality Date  . Prostate biopsy  04/03/06 & 02/25/07  . High intense focused ultrasound  07/20/06     By Dr. Jacqlyn Larsen  . Cytoscopy prostatic stone o/w nml  06/21/08    Dr. Jacqlyn Larsen  . Ett myoview  09/13/09    Low risk EF 49%  . Anterior cervical decomp/discectomy fusion  08/25/2012    Procedure: ANTERIOR CERVICAL DECOMPRESSION/DISCECTOMY FUSION 2 LEVELS;  Surgeon: Hosie Spangle, MD;  Location: [place] [facility];  Service: Neurosurgery;  Laterality: N/A;  Cervical five-six Cervical six-seven anterior cervical decompression with fusion and plating and bonegraft  . Intraoperative transesophageal echocardiogram N/A 04/26/2014    Procedure: INTRAOPERATIVE TRANSESOPHAGEAL ECHOCARDIOGRAM;  Surgeon: Ivin Poot, MD;  Location: [place];  Service: Open Heart Surgery;  Laterality: N/A;  . Coronary artery bypass graft N/A 04/26/2014    Procedure: CORONARY ARTERY BYPASS GRAFTING (CABG), on pump, times four, using left internal mammary artery, right greater saphenous vein harvested endoscopically.;  Surgeon: Ivin Poot, MD;  Location: [place];  Service: Open Heart Surgery;  Laterality: N/A;  LIMA to LAD, SVG to DIAGONAL, SVG to OM1, SVG to PDA) with EVH of the RIGHT THIGH and LOWER EXTREMITY SAPHENOUS VEIN  . Back surgery    . Percutaneous coronary rotoblator intervention (pci-r) N/A 07/22/2014    Procedure: PERCUTANEOUS CORONARY ROTOBLATOR INTERVENTION (PCI-R);  Surgeon: Peter M Martinique, MD;  Location: [facility] CATH LAB;  Service: Cardiovascular;  Laterality: N/A;  . Cardiac catheterization  04/2014; 07/19/2014     Family History  Problem Relation Age of Onset  . Diabetes Mother 75    DM  . Coronary artery disease Mother   . Hypertension Mother   . Heart attack Mother     CABG with MI in 2005  . Heart disease Mother     CV  . Hypertension Father   . Heart attack Father     CABG with Mi around 1997  . Coronary artery disease Father   . Heart disease Father     CV  . Diabetes Father   . Heart attack Brother     MI/PTCA  . Heart disease Brother     CV  . Diabetes Brother   . Heart attack Maternal  Grandfather     MI  . Prostate cancer Paternal Grandfather   . Breast cancer Neg Hx     Breast/ovarian/uterine cancer  . Colon cancer Neg Hx   . Depression Neg Hx   . Alcohol abuse Neg Hx     ETOH/drug abuse  . Stroke Neg Hx   . Diabetes Sister     DM     Social History   Social History  . Marital Status: Married    Spouse Name: N/A  . Number of Children: 3  . Years of Education: N/A   Occupational History  .  Insurance account manager    Social History Main Topics  . Smoking status: Former Smoker -- 1.00 packs/day for 36 years    Types: Cigarettes    Quit date: 03/01/2014  . Smokeless tobacco: Never Used  . Alcohol Use: Yes     Comment: 07/19/2014 "might have a drink a couple times/yr"  . Drug Use: No  . Sexual Activity: Not Currently   Other Topics Concern  . Not on file   Social History Narrative   Lives with wife.   2 daughters and 1 son.   [employer] fan   Runs [employer]     ROS A 10 point review of system was performed. It is negative other than that mentioned in the history of present illness.   PHYSICAL EXAM   BP 112/62 mmHg  Pulse 65  Ht 5\' 9"  (1.753 m)  Wt 271 lb 8 oz (123.152 kg)  BMI 40.08 kg/m2 Constitutional: He is oriented to person, place, and time. He appears well-developed and well-nourished. No distress.  HENT: No nasal discharge.  Head: Normocephalic and atraumatic.  Eyes: Pupils are equal and round.  No discharge. Neck: Normal range of motion. Neck supple. No JVD present. No thyromegaly present.  Cardiovascular: Normal rate, regular rhythm, normal heart sounds. Exam reveals no gallop and no friction rub. No murmur heard.  Pulmonary/Chest: Effort normal and breath sounds normal. No stridor. No respiratory distress. He has no wheezes. He has no rales. He exhibits no tenderness.  Abdominal: Soft. Bowel sounds are normal. He exhibits no distension. There is no tenderness. There is no rebound and no guarding.    Musculoskeletal: Normal range of motion. He exhibits no edema and no tenderness.  Neurological: He is alert and oriented to person, place, and time. Coordination normal.  Skin: Skin is warm and dry. No rash noted. He is not diaphoretic. No erythema. No pallor.  Psychiatric: He has a normal mood and affect. His behavior is normal. Judgment and thought content normal.      EKG: Normal sinus rhythm with nonspecific ST and T wave changes.   ASSESSMENT AND PLAN

## 2015-07-22 NOTE — Assessment & Plan Note (Signed)
The patient stopped taking small dose Crestor due to myalgia. He reports prolonged history of intolerance to statins. I requested a fasting lipid and liver profile. I discussed with him the importance of treatment with statins and if not tolerated we might need to consider PCSK 9 inhibitors. I would probably treat him with pravastatin with the addition of Zetia if tolerated.

## 2015-07-22 NOTE — Assessment & Plan Note (Signed)
The patient has chronic moderate bilateral calf claudication. He is scheduled for angiogram with Dr. Ronalee Belts.

## 2015-07-26 ENCOUNTER — Encounter
Admission: RE | Disposition: A | Discharge status: Home / Self Care | Payer: Self-pay | Source: Ambulatory Visit | Attending: Vascular Surgery

## 2015-07-26 ENCOUNTER — Ambulatory Visit
Admission: RE | Admit: 2015-07-26 | Discharge: 2015-07-26 | Disposition: A | Discharge status: Home / Self Care | Payer: BLUE CROSS/BLUE SHIELD | Source: Ambulatory Visit | Attending: Vascular Surgery | Admitting: Vascular Surgery

## 2015-07-26 ENCOUNTER — Encounter: Payer: Self-pay | Admitting: *Deleted

## 2015-07-26 DIAGNOSIS — E119 Type 2 diabetes mellitus without complications: Secondary | ICD-10-CM | POA: Insufficient documentation

## 2015-07-26 DIAGNOSIS — I2510 Atherosclerotic heart disease of native coronary artery without angina pectoris: Secondary | ICD-10-CM | POA: Diagnosis not present

## 2015-07-26 DIAGNOSIS — Z7982 Long term (current) use of aspirin: Secondary | ICD-10-CM | POA: Diagnosis not present

## 2015-07-26 DIAGNOSIS — Z794 Long term (current) use of insulin: Secondary | ICD-10-CM | POA: Diagnosis not present

## 2015-07-26 DIAGNOSIS — I70213 Atherosclerosis of native arteries of extremities with intermittent claudication, bilateral legs: Secondary | ICD-10-CM | POA: Insufficient documentation

## 2015-07-26 DIAGNOSIS — Z79899 Other long term (current) drug therapy: Secondary | ICD-10-CM | POA: Insufficient documentation

## 2015-07-26 DIAGNOSIS — I10 Essential (primary) hypertension: Secondary | ICD-10-CM | POA: Diagnosis not present

## 2015-07-26 DIAGNOSIS — Z87891 Personal history of nicotine dependence: Secondary | ICD-10-CM | POA: Diagnosis not present

## 2015-07-26 DIAGNOSIS — E785 Hyperlipidemia, unspecified: Secondary | ICD-10-CM | POA: Insufficient documentation

## 2015-07-26 HISTORY — DX: Peripheral vascular disease, unspecified: I73.9

## 2015-07-26 HISTORY — PX: PERIPHERAL VASCULAR CATHETERIZATION: SHX172C

## 2015-07-26 LAB — BUN: BUN: 20 mg/dL (ref 6–20)

## 2015-07-26 LAB — GLUCOSE, CAPILLARY: Glucose-Capillary: 169 mg/dL — ABNORMAL HIGH (ref 65–99)

## 2015-07-26 LAB — CREATININE, SERUM
Creatinine, Ser: 0.89 mg/dL (ref 0.61–1.24)
GFR calc Af Amer: >60 mL/min (ref >60)
GFR calc non Af Amer: >60 mL/min (ref >60)

## 2015-07-26 SURGERY — LOWER EXTREMITY ANGIOGRAPHY
Anesthesia: Moderate Sedation | Laterality: Left | Wound class: Clean

## 2015-07-26 MED ORDER — HEPARIN SODIUM (PORCINE) 1000 UNIT/ML IJ SOLN
INTRAMUSCULAR | Status: DC | PRN
  Administered 2015-07-26: 5000 [IU] via INTRAVENOUS

## 2015-07-26 MED ORDER — CLOPIDOGREL BISULFATE 75 MG PO TABS: 75.0000 mg | ORAL_TABLET | Freq: Every day | ORAL | Status: DC

## 2015-07-26 MED ORDER — DEXTROSE 5 % IV SOLN
1.5000 g | INTRAVENOUS | Status: AC
  Administered 2015-07-26: 1.5 g via INTRAVENOUS

## 2015-07-26 MED ORDER — MIDAZOLAM HCL 2 MG/2ML IJ SOLN
INTRAMUSCULAR | Status: DC | PRN
  Administered 2015-07-26 (×2): 2 mg via INTRAVENOUS

## 2015-07-26 MED ORDER — SODIUM CHLORIDE 0.9 % IV SOLN
INTRAVENOUS | Status: DC
  Administered 2015-07-26: 10:00:00 via INTRAVENOUS

## 2015-07-26 MED ORDER — HEPARIN SODIUM (PORCINE) 1000 UNIT/ML IJ SOLN
INTRAMUSCULAR | Status: AC
  Filled 2015-07-26: qty 1

## 2015-07-26 MED ORDER — FENTANYL CITRATE (PF) 100 MCG/2ML IJ SOLN
INTRAMUSCULAR | Status: DC | PRN
  Administered 2015-07-26 (×2): 50 ug via INTRAVENOUS

## 2015-07-26 MED ORDER — LIDOCAINE HCL (PF) 1 % IJ SOLN
INTRAMUSCULAR | Status: AC
Start: 2015-07-26 — End: 2015-07-26
  Filled 2015-07-26: qty 30

## 2015-07-26 MED ORDER — HEPARIN (PORCINE) IN NACL 2-0.9 UNIT/ML-% IJ SOLN
INTRAMUSCULAR | Status: AC
  Filled 2015-07-26: qty 1000

## 2015-07-26 MED ORDER — LIDOCAINE HCL 1 % IJ SOLN
INTRAMUSCULAR | Status: DC | PRN
  Administered 2015-07-26: 5 mL via INTRADERMAL

## 2015-07-26 MED ORDER — IOHEXOL 300 MG/ML  SOLN
INTRAMUSCULAR | Status: DC | PRN
  Administered 2015-07-26: 50 mL via INTRA_ARTERIAL

## 2015-07-26 MED ORDER — INSULIN ASPART 100 UNIT/ML ~~LOC~~ SOLN: 0.0000 [IU] | SUBCUTANEOUS | Status: DC

## 2015-07-26 MED ORDER — FENTANYL CITRATE (PF) 100 MCG/2ML IJ SOLN
INTRAMUSCULAR | Status: AC
  Filled 2015-07-26: qty 2

## 2015-07-26 MED ORDER — MIDAZOLAM HCL 5 MG/5ML IJ SOLN
INTRAMUSCULAR | Status: AC
  Filled 2015-07-26: qty 5

## 2015-07-26 SURGICAL SUPPLY — 18 items
BALLN LUTONIX 6X120X130 (BALLOONS) ×3
BALLOON LUTONIX 6X120X130 (BALLOONS) ×2 IMPLANT
CATH PIG 70CM (CATHETERS) ×6 IMPLANT
CATH RIM 65CM (CATHETERS) ×3 IMPLANT
DEVICE PRESTO INFLATION (MISCELLANEOUS) ×3 IMPLANT
DEVICE STARCLOSE SE CLOSURE (Vascular Products) ×3 IMPLANT
DEVICE TORQUE (MISCELLANEOUS) ×3 IMPLANT
GLIDEWIRE ADV .035X260CM (WIRE) ×3 IMPLANT
GLIDEWIRE ANGLED SS 035X260CM (WIRE) ×3 IMPLANT
GUIDEWIRE SUPER STIFF .035X180 (WIRE) ×3 IMPLANT
PACK ANGIOGRAPHY (CUSTOM PROCEDURE TRAY) ×3 IMPLANT
SET INTRO CAPELLA COAXIAL (SET/KITS/TRAYS/PACK) ×3 IMPLANT
SHEATH ANL2 6FRX45 HC (SHEATH) ×3 IMPLANT
SHEATH BRITE TIP 5FRX11 (SHEATH) ×3 IMPLANT
SYR MEDRAD MARK V 150ML (SYRINGE) ×3 IMPLANT
TUBING CONTRAST HIGH PRESS 72 (TUBING) ×3 IMPLANT
WIRE J 3MM .035X145CM (WIRE) ×3 IMPLANT
WIRE MAGIC TORQUE 260C (WIRE) ×3 IMPLANT

## 2015-07-26 NOTE — Op Note (Signed)
Phillip Clements Percutaneous Study/Intervention Procedural Note   Date of Surgery: 07/26/2015  Surgeon:  Katha Cabal, MD.  Pre-operative Diagnosis: Atherosclerotic occlusive disease bilateral lower extremities with lifestyle limiting claudication; painful lower extremity  Post-operative diagnosis: Same  Procedure(s) Performed: 1. Introduction catheter into left lower extremity 3rd order catheter placement  2. Contrast injection left lower extremity for distal runoff   3. Percutaneous transluminal angioplasty left superficial femoral artery 4. Star close closure right common femoral arteriotomy  Anesthesia: Conscious sedation with IV Versed and fentanyl  Sheath: 6 French Ansell  Contrast: 55 cc  Fluoroscopy Time: 8.5 minutes  Indications: Phillip Clements presents with lifestyle limiting claudication bilateral lower extremities. On examination he does not have palpable distal pulses and noninvasive ultrasound studies demonstrate critical lesions within the SFA bilaterally left greater than right. The patient has considered interventional techniques to treat his atherosclerotic stenosis and is decided to proceed. The risks and benefits are reviewed all questions answered patient agrees to proceed.  Procedure: Phillip Clements is a 64 y.o. y.o. male who was identified and appropriate procedural time out was performed. The patient was then placed supine on the table and prepped and draped in the usual sterile fashion.   Ultrasound was placed in the sterile sleeve and the right groin was evaluated the right common femoral artery was echolucent and pulsatile indicating patency.  Image was recorded for the permanent record and under real-time visualization a microneedle was inserted into the common femoral artery microwire followed by a micro-sheath.  A J-wire was then advanced through the micro-sheath and a  5  [place] sheath was then inserted over a J-wire. J-wire was then advanced and a 5 French pigtail catheter was positioned at the level of T12. AP projection of the aorta was then obtained. Pigtail catheter was repositioned to above the bifurcation and a RAO view of the pelvis was obtained.  Subsequently a pigtail catheter with the stiff angle Glidewire was used to cross the aortic bifurcation the catheter wire were advanced down into the left distal external iliac artery. Oblique view of the femoral bifurcation was then obtained and subsequently the wire was reintroduced and the pigtail catheter negotiated into the SFA representing third order catheter placement. Distal runoff was then performed.  5000 units of heparin was then given and allowed to circulate and a Ansell sheath was advanced up and over the bifurcation and positioned in the femoral artery  Straight  catheter and stiff angle Glidewire were then negotiated down into the distal popliteal.  Distal runoff was then completed by hand injection through the catheter. The wire was then reintroduced and a 6 x 10 Lutonix balloon was used to angioplasty the superficial femoral artery. Inflation were to 10 atmospheres for 3 minutes. Follow-up imaging demonstrated patency with adequate treatment of the lesion no evidence of dissection less than 5% residual stenosis. Therefore I elected not to place a stent.. Distal runoff was then reassessed.  After review of these images the sheath is pulled into the right external iliac oblique of the common femoral is obtained and a Star close device deployed. There no immediate Complications.  Findings: Aorta and bilateral iliac systems are widely patent. The left common femoral profunda femoris is widely patent. There is moderate stenosis at the origin of the SFA and then approximately 3-4 cm distally to the origin there is a greater than 90% narrowing. There is some mild irregularity in the midportion and at Hunter's  canal of the  left SFA but these areas do not appear to exceed 20-30% diameter reduction and therefore I do not believe should be treated at this time. Trifurcation is widely patent and there is moderate disease distally in the tibial vessels particularly the anterior tibial and the posterior tibial. Posterior tibial appears to be the dominant runoff to the foot filling the lateral plantar vessels first as well as the pedal arch.  Following angioplasty to 6 mm with a Lutonix balloon there is essentially complete resolution of the lesion area   Disposition: Patient was taken to the recovery room in stable condition having tolerated the procedure well.  Schnier, Dolores Lory 05/17/2015,3:14 PM

## 2015-07-26 NOTE — H&P (Addendum)
[facility] History & Physical Update  The patient was interviewed and re-examined.  The patient's previous History and Physical has been reviewed and is unchanged.  There is no change in the plan of care. We plan to proceed with the scheduled procedure.  The plan will be for angiography with intervention initially the left leg will be addressed area noninvasive studies demonstrated high-grade SFA lesion with peak systolic velocities greater than 500 cm/s. Patient is having increased pain in his leg with lifestyle limitations and therefore requests treatment. When compared to Dr. Fletcher Anon H&P there are no differences. When compared to my H&P from the office dated October 24 there had been no additional changes.  Zian Mohamed, Dolores Lory, MD  07/26/2015, 9:50 AM

## 2015-07-27 ENCOUNTER — Other Ambulatory Visit: Payer: Self-pay | Admitting: *Deleted

## 2015-07-27 ENCOUNTER — Encounter: Payer: Self-pay | Admitting: Vascular Surgery

## 2015-07-27 DIAGNOSIS — R0602 Shortness of breath: Secondary | ICD-10-CM

## 2015-07-27 DIAGNOSIS — I2510 Atherosclerotic heart disease of native coronary artery without angina pectoris: Secondary | ICD-10-CM

## 2015-07-27 DIAGNOSIS — R079 Chest pain, unspecified: Secondary | ICD-10-CM

## 2015-07-27 DIAGNOSIS — E785 Hyperlipidemia, unspecified: Secondary | ICD-10-CM

## 2015-07-27 DIAGNOSIS — I10 Essential (primary) hypertension: Secondary | ICD-10-CM

## 2015-07-27 MED ORDER — CLOPIDOGREL BISULFATE 75 MG PO TABS: 75.0000 mg | ORAL_TABLET | Freq: Every day | ORAL | Status: DC

## 2015-07-27 MED ORDER — CARVEDILOL 3.125 MG PO TABS: 3.1250 mg | ORAL_TABLET | Freq: Two times a day (BID) | ORAL | Status: DC

## 2015-07-27 NOTE — Telephone Encounter (Signed)
Requested Prescriptions   Signed Prescriptions Disp Refills  . carvedilol (COREG) 3.125 MG tablet 60 tablet 3    Sig: Take 1 tablet (3.125 mg total) by mouth 2 (two) times daily with a meal.    Authorizing Provider: [name] A    Ordering User: Britt Bottom

## 2015-08-01 ENCOUNTER — Other Ambulatory Visit (INDEPENDENT_AMBULATORY_CARE_PROVIDER_SITE_OTHER): Payer: BLUE CROSS/BLUE SHIELD | Admitting: *Deleted

## 2015-08-01 DIAGNOSIS — I25700 Atherosclerosis of coronary artery bypass graft(s), unspecified, with unstable angina pectoris: Secondary | ICD-10-CM

## 2015-08-02 LAB — HEPATIC FUNCTION PANEL
ALT: 18 IU/L (ref 0–44)
AST: 13 IU/L (ref 0–40)
Albumin: 4.1 g/dL (ref 3.6–4.8)
Alkaline Phosphatase: 75 IU/L (ref 39–117)
Bilirubin Total: 0.4 mg/dL (ref 0.0–1.2)
Bilirubin, Direct: 0.11 mg/dL (ref 0.00–0.40)
Total Protein: 6.2 g/dL (ref 6.0–8.5)

## 2015-08-02 LAB — LIPID PANEL
Chol/HDL Ratio: 6.3 ratio units — ABNORMAL HIGH (ref 0.0–5.0)
Cholesterol, Total: 222 mg/dL — ABNORMAL HIGH (ref 100–199)
HDL: 35 mg/dL — ABNORMAL LOW (ref >39)
LDL Calculated: 147 mg/dL — ABNORMAL HIGH (ref 0–99)
Triglycerides: 198 mg/dL — ABNORMAL HIGH (ref 0–149)
VLDL Cholesterol Cal: 40 mg/dL (ref 5–40)

## 2015-08-04 ENCOUNTER — Other Ambulatory Visit: Payer: Self-pay

## 2015-08-04 ENCOUNTER — Other Ambulatory Visit: Payer: Self-pay | Admitting: *Deleted

## 2015-08-04 DIAGNOSIS — I10 Essential (primary) hypertension: Secondary | ICD-10-CM

## 2015-08-04 DIAGNOSIS — E785 Hyperlipidemia, unspecified: Secondary | ICD-10-CM

## 2015-08-04 DIAGNOSIS — R079 Chest pain, unspecified: Secondary | ICD-10-CM

## 2015-08-04 DIAGNOSIS — I2510 Atherosclerotic heart disease of native coronary artery without angina pectoris: Secondary | ICD-10-CM

## 2015-08-04 MED ORDER — CARVEDILOL 3.125 MG PO TABS: 3.1250 mg | ORAL_TABLET | Freq: Two times a day (BID) | ORAL | Status: DC

## 2015-08-04 MED ORDER — EZETIMIBE 10 MG PO TABS: 10.0000 mg | ORAL_TABLET | Freq: Every day | ORAL | Status: DC

## 2015-08-04 MED ORDER — PRAVASTATIN SODIUM 40 MG PO TABS: 40.0000 mg | ORAL_TABLET | Freq: Every evening | ORAL | Status: DC

## 2015-08-05 ENCOUNTER — Other Ambulatory Visit (INDEPENDENT_AMBULATORY_CARE_PROVIDER_SITE_OTHER): Payer: BLUE CROSS/BLUE SHIELD | Admitting: *Deleted

## 2015-08-05 ENCOUNTER — Encounter: Payer: Self-pay | Admitting: Internal Medicine

## 2015-08-05 ENCOUNTER — Ambulatory Visit (INDEPENDENT_AMBULATORY_CARE_PROVIDER_SITE_OTHER): Payer: BLUE CROSS/BLUE SHIELD | Admitting: Internal Medicine

## 2015-08-05 VITALS — BP 114/62 | HR 67 | Temp 97.7°F | Resp 12 | Wt 270.0 lb

## 2015-08-05 DIAGNOSIS — E1159 Type 2 diabetes mellitus with other circulatory complications: Secondary | ICD-10-CM | POA: Diagnosis not present

## 2015-08-05 DIAGNOSIS — E1165 Type 2 diabetes mellitus with hyperglycemia: Secondary | ICD-10-CM

## 2015-08-05 LAB — POCT GLYCOSYLATED HEMOGLOBIN (HGB A1C): Hemoglobin A1C: 7.8

## 2015-08-05 MED ORDER — INSULIN ASPART 100 UNIT/ML FLEXPEN: 36.0000 [IU] | PEN_INJECTOR | Freq: Three times a day (TID) | SUBCUTANEOUS | Status: DC

## 2015-08-05 MED ORDER — INSULIN GLARGINE 300 UNIT/ML ~~LOC~~ SOPN: 110.0000 [IU] | PEN_INJECTOR | Freq: Every day | SUBCUTANEOUS | Status: DC

## 2015-08-05 MED ORDER — METFORMIN HCL ER 500 MG PO TB24
500.0000 mg | ORAL_TABLET | Freq: Two times a day (BID) | ORAL | Status: DC
Start: 2015-08-05 — End: 2015-10-17

## 2015-08-05 NOTE — Patient Instructions (Addendum)
Please continue: - Invokana 100 mg daily - Tanzeum 50 mg weekly - Toujeo 110 units at bedtime - Novolog 32 units 3x a day, before meals + sliding scale  Please start Metformin ER 500 mg with dinner x 4 days. If you tolerate this well, add another Metformin tablet (500 mg) with breakfast x 4 days. If you tolerate this well, add another metformin tablet with dinner (total 1000 mg) x 4 days. If you tolerate this well, add another metformin tablet with breakfast (total 1000 mg). Continue with 1000 mg of metformin 2x a day with breakfast and dinner.  Please come back for a follow-up appointment in 3 months with your sugar log.

## 2015-08-05 NOTE — Progress Notes (Signed)
Patient ID: Phillip Clements, male   DOB: 1951-07-27, 64 y.o.   MRN: [id number]  HPI: Phillip Clements is a 64 y.o.-year-old male, returning for f/u for DM2, dx in 1996, insulin-dependent since 2008, uncontrolled, with complications (CAD - s/p stents, CABG, cerebro-vascular ds - s/p CVA). Last visit 3 mo ago.  Last hemoglobin A1c was: Lab Results  Component Value Date   HGBA1C 7.1* 05/06/2015   HGBA1C 8.5* 02/01/2015   HGBA1C 9.1* 10/21/2014   Pt is on a regimen of: - Invokana 100 mg daily - added 03/2015 - Tanzeum 50 mg weekly - Toujeo 110 >> 80 >> 110 (2x55 units) at bedtime - increased 2-3 weeks ago - Novolog 28 >> 32 units 3x a day, before meals - + SSI He tried Victoza and Januvia Had GI intolerance (nausea) to Metformin.  Pt checks his sugars 1-3x a day and they are: - am: 173-250 >> 60-120 >> 158-198, 207 - 2h after b'fast: n/c - before lunch: 77, 82, 113-221, 340 >> 94-130 >> 82, 126-208 - 2h after lunch: n/c - before dinner: 159, 167 >> 94-130 >> 136 - 2h after dinner: n/c - bedtime: 198, 254 >> 120 >> 307  - nighttime: n/c No lows. Lowest sugar was 77 >> 60s; he has hypoglycemia awareness at 70s.  Highest sugar was 340 >> 220x1 >> 307.  Glucometer: OneTouch Ultra mini  Pt's meals are: - Breakfast: thin bagel + cream cheese + special K cinnamon cereal - Lunch: sandwich, chips, cottage cheese, pineapple - Dinner: meat + 2 veggies, salads - Snacks: 2/day: celery + PB; low salt triscuit + laughing cow cheese  Exercise: cardio + weights 2x a day; golf on wed.  - no CKD, last BUN/creatinine:  Lab Results  Component Value Date   BUN 20 07/26/2015   CREATININE 0.89 07/26/2015  On Losartan - last set of lipids: Lab Results  Component Value Date   CHOL 222* 08/01/2015   HDL 35* 08/01/2015   LDLCALC 147* 08/01/2015   LDLDIRECT 157.1 09/07/2013   TRIG 198* 08/01/2015   CHOLHDL 6.3* 08/01/2015  Statins >> joint pain.  - last eye exam was in 10/04/2014. [facility]. No DR. Had cataract sx. - no numbness and tingling in his feet.  ROS: Constitutional: no weight gain/loss, no fatigue, no subjective hyperthermia/hypothermia, + nocturia Eyes: no blurry vision, no xerophthalmia ENT: no sore throat, no nodules palpated in throat, no dysphagia/odynophagia, no hoarseness Cardiovascular: no CP/SOB/palpitations/leg swelling Respiratory: no cough/SOB Gastrointestinal: no N/V/D/C Musculoskeletal: no muscle/joint aches Skin: no rashes, + easy bruising Neurological: no tremors/numbness/tingling/dizziness  I reviewed pt's medications, allergies, PMH, social hx, family hx, and changes were documented in the history of present illness. Otherwise, unchanged from my initial visit note:  Past Medical History  Diagnosis Date  . Hypertension 2000  . Coronary artery disease     a. CABG 04/2014: (LIMA--> LAD, SVG --> DIAG, SVG--> OM1, SVG--> PDA)  b. early graft failure. s/p successful rotational atherectomy and stenting of the left main and proximal LAD with DES. DES of mid LAD.(07/22/14)  . Hyperlipidemia   . OSA on CPAP   . Type II diabetes mellitus ([place])   . CVA (cerebral infarction)   . Peripheral vascular disease ([place])   . Stroke ([place])   . Prostate cancer ([place])   . Prostate cancer ([place])   . Prostate cancer ([place])   . Prostate cancer [facility])    Past Surgical History  Procedure Laterality Date  . Prostate  biopsy  04/03/06 & 02/25/07  . High intense focused ultrasound  07/20/06    By Dr. Jacqlyn Larsen  . Cytoscopy prostatic stone o/w nml  06/21/08    Dr. Jacqlyn Larsen  . Ett myoview  09/13/09    Low risk EF 49%  . Anterior cervical decomp/discectomy fusion  08/25/2012    Procedure: ANTERIOR CERVICAL DECOMPRESSION/DISCECTOMY FUSION 2 LEVELS;  Surgeon: Hosie Spangle, MD;  Location: [place] NEURO ORS;  Service: Neurosurgery;  Laterality: N/A;  Cervical five-six Cervical six-seven anterior cervical decompression with fusion and plating and bonegraft  . Intraoperative  transesophageal echocardiogram N/A 04/26/2014    Procedure: INTRAOPERATIVE TRANSESOPHAGEAL ECHOCARDIOGRAM;  Surgeon: Ivin Poot, MD;  Location: [place];  Service: Open Heart Surgery;  Laterality: N/A;  . Coronary artery bypass graft N/A 04/26/2014    Procedure: CORONARY ARTERY BYPASS GRAFTING (CABG), on pump, times four, using left internal mammary artery, right greater saphenous vein harvested endoscopically.;  Surgeon: Ivin Poot, MD;  Location: [place];  Service: Open Heart Surgery;  Laterality: N/A;  LIMA to LAD, SVG to DIAGONAL, SVG to OM1, SVG to PDA) with EVH of the RIGHT THIGH and LOWER EXTREMITY SAPHENOUS VEIN  . Back surgery    . Percutaneous coronary rotoblator intervention (pci-r) N/A 07/22/2014    Procedure: PERCUTANEOUS CORONARY ROTOBLATOR INTERVENTION (PCI-R);  Surgeon: Peter M Martinique, MD;  Location: [facility] CATH LAB;  Service: Cardiovascular;  Laterality: N/A;  . Cardiac catheterization  04/2014; 07/19/2014  . Peripheral vascular catheterization Left 07/26/2015    Procedure: Lower Extremity Angiography;  Surgeon: Katha Cabal, MD;  Location: [place] CV LAB;  Service: Cardiovascular;  Laterality: Left;  . Peripheral vascular catheterization  07/26/2015    Procedure: Lower Extremity Intervention;  Surgeon: Katha Cabal, MD;  Location: [place] CV LAB;  Service: Cardiovascular;;   Social History   Social History  . Marital Status: Married    Spouse Name: N/A  . Number of Children: 3  . Years of Education: N/A   Occupational History  . Insurance account manager    Social History Main Topics  . Smoking status: Former Smoker -- 1.00 packs/day for 36 years    Types: Cigarettes    Quit date: 03/01/2014  . Smokeless tobacco: Never Used  . Alcohol Use: Yes     Comment: 07/19/2014 "might have a drink a couple times/yr"  . Drug Use: No  . Sexual Activity: Not Currently   Other Topics Concern  . Not on file   Social History Narrative    Lives with wife.   2 daughters and 1 son.   [employer] fan   Runs [employer]   Current Outpatient Prescriptions on File Prior to Visit  Medication Sig Dispense Refill  . Albiglutide 50 MG PEN Inject 50 mg into the skin once a week. 4 each 3  . aspirin EC 81 MG tablet Take 1 tablet (81 mg total) by mouth daily. 90 tablet 3  . B-D ULTRAFINE III SHORT PEN 31G X 8 MM MISC USE AS DIRECTED TWICE DAILY 100 each 11  . bicalutamide (CASODEX) 50 MG tablet Take 50 mg by mouth daily.    . carvedilol (COREG) 3.125 MG tablet Take 1 tablet (3.125 mg total) by mouth 2 (two) times daily with a meal. 60 tablet 3  . clopidogrel (PLAVIX) 75 MG tablet Take 1 tablet (75 mg total) by mouth daily with breakfast. 30 tablet 11  . clopidogrel (PLAVIX) 75 MG tablet Take 1 tablet (75  mg total) by mouth daily. 30 tablet 11  . ezetimibe (ZETIA) 10 MG tablet Take 1 tablet (10 mg total) by mouth daily. 30 tablet 3  . furosemide (LASIX) 40 MG tablet Take 1 tablet (40 mg total) by mouth daily as needed for fluid or edema. 30 tablet 3  . insulin aspart (NOVOLOG FLEXPEN) 100 UNIT/ML FlexPen Inject 28 Units into the skin 3 (three) times daily with meals. 25 mL 3  . Insulin Glargine (TOUJEO SOLOSTAR) 300 UNIT/ML SOPN Inject 80 Units into the skin at bedtime. (Patient taking differently: Inject 110 Units into the skin at bedtime. ) 6 pen 3  . INVOKANA 100 MG TABS tablet TAKE ONE TABLET BY MOUTH DAILY 30 tablet 2  . leuprolide (LUPRON) 30 MG injection Inject 45 mg into the muscle every 6 (six) months.    Marland Kitchen losartan (COZAAR) 25 MG tablet Take 0.5 tablets (12.5 mg total) by mouth daily. 30 tablet 6  . NITROSTAT 0.4 MG SL tablet Place 0.4 mg under the tongue as needed.   1  . ONE TOUCH ULTRA TEST test strip USE AS DIRECTED TO TEST BLOOD SUGARS TWICE DAILY 200 each 3  . potassium chloride SA (K-DUR,KLOR-CON) 20 MEQ tablet Take 1 tablet (20 mEq total) by mouth daily as needed (Take one tabley if you take a Lasix tablet). 30 tablet 3  .  pravastatin (PRAVACHOL) 40 MG tablet Take 1 tablet (40 mg total) by mouth every evening. 30 tablet 3   No current facility-administered medications on file prior to visit.   Allergies  Allergen Reactions  . Metformin And Related Nausea Only  . Statins     REACTION: JOINT ACHES   Family History  Problem Relation Age of Onset  . Diabetes Mother 68    DM  . Coronary artery disease Mother   . Hypertension Mother   . Heart attack Mother     CABG with MI in 2005  . Heart disease Mother     CV  . Hypertension Father   . Heart attack Father     CABG with Mi around 1997  . Coronary artery disease Father   . Heart disease Father     CV  . Diabetes Father   . Heart attack Brother     MI/PTCA  . Heart disease Brother     CV  . Diabetes Brother   . Heart attack Maternal Grandfather     MI  . Prostate cancer Paternal Grandfather   . Breast cancer Neg Hx     Breast/ovarian/uterine cancer  . Colon cancer Neg Hx   . Depression Neg Hx   . Alcohol abuse Neg Hx     ETOH/drug abuse  . Stroke Neg Hx   . Diabetes Sister     DM   PE: BP 114/62 mmHg  Pulse 67  Temp(Src) 97.7 F (36.5 C) (Oral)  Resp 12  Wt 270 lb (122.471 kg)  SpO2 96% Body mass index is 39.85 kg/(m^2). Wt Readings from Last 3 Encounters:  08/05/15 270 lb (122.471 kg)  07/26/15 269 lb (122.018 kg)  07/22/15 271 lb 8 oz (123.152 kg)   Constitutional: obese, in NAD Eyes: PERRLA, EOMI, no exophthalmos ENT: moist mucous membranes, no thyromegaly, no cervical lymphadenopathy Cardiovascular: RRR, No MRG Respiratory: CTA B Gastrointestinal: abdomen protuberant, soft, NT, ND, BS+ Musculoskeletal: no deformities, strength intact in all 4 Skin: moist, warm, no rashes Neurological: no tremor with outstretched hands, DTR normal in all 4  ASSESSMENT: 1. DM2,  insulin-dependent, uncontrolled, with complications - CAD - s/p stents, CABG - cerebro-vascular ds - s/p CVA   PLAN:  1. Patient with long-standing,  uncontrolled diabetes, now with better control on basal - bolus insulin regimen + GLP1 R agonist and SGLT2 inhibitor at last visit >> great improvement in DM control at last visit. As he had occasional low sugars especially in am and he missed Novolog if sugars <100, we decreased Toujeo and NovoLog >> sugars higher as he is not as active as before (finished the house he has been working). - we discussed about increasing Invokana >> would not want to do this as he has increased urination already. In this case, we will try again Metformin in the ER form. He agrees. - I suggested to:  Patient Instructions  Please continue: - Invokana 100 mg daily - Tanzeum 50 mg weekly - Toujeo 110 units at bedtime - Novolog 32 units 3x a day, before meals + sliding scale  Please start Metformin ER 500 mg with dinner x 4 days. If you tolerate this well, add another Metformin tablet (500 mg) with breakfast x 4 days. If you tolerate this well, add another metformin tablet with dinner (total 1000 mg) x 4 days. If you tolerate this well, add another metformin tablet with breakfast (total 1000 mg). Continue with 1000 mg of metformin 2x a day with breakfast and dinner.  Please come back for a follow-up appointment in 3 months with your sugar log.  - continue checking sugars at different times of the day - check 3 times a day, rotating checks - advised for yearly eye exams >> he is UTD - refilled Insulins - will check HbA1c today >> 7.8% (higher) - Return to clinic in 3 mo with sugar log

## 2015-08-11 ENCOUNTER — Ambulatory Visit: Payer: BLUE CROSS/BLUE SHIELD

## 2015-08-12 ENCOUNTER — Encounter: Payer: Self-pay | Admitting: Sports Medicine

## 2015-08-12 ENCOUNTER — Ambulatory Visit (INDEPENDENT_AMBULATORY_CARE_PROVIDER_SITE_OTHER): Payer: BLUE CROSS/BLUE SHIELD | Admitting: Sports Medicine

## 2015-08-12 DIAGNOSIS — R0989 Other specified symptoms and signs involving the circulatory and respiratory systems: Secondary | ICD-10-CM

## 2015-08-12 DIAGNOSIS — B351 Tinea unguium: Secondary | ICD-10-CM | POA: Diagnosis not present

## 2015-08-12 DIAGNOSIS — I739 Peripheral vascular disease, unspecified: Secondary | ICD-10-CM

## 2015-08-12 DIAGNOSIS — M79676 Pain in unspecified toe(s): Secondary | ICD-10-CM

## 2015-08-12 DIAGNOSIS — E1142 Type 2 diabetes mellitus with diabetic polyneuropathy: Secondary | ICD-10-CM

## 2015-08-13 NOTE — Progress Notes (Signed)
Patient ID: Phillip Clements, male   DOB: 04-Sep-1950, 64 y.o.   MRN: [id number] Subjective: Phillip Clements is a 64 y.o. male patient with history of type 2 diabetes who presents to office today complaining of long, painful nails  while ambulating in shoes; unable to trim. Patient states that the glucose reading this morning was not recorded but admits A1c 7.8. Patient denies any new changes in medication or new problems. Patient denies any new cramping, numbness, burning or tingling in the legs.  Patient Active Problem List   Diagnosis Date Noted  . Poorly controlled type 2 diabetes mellitus with circulatory disorder ([place]) 03/18/2015  . CVA (cerebral vascular accident) ([place]) 07/23/2014  . CVA (cerebral infarction)   . OSA on CPAP   . Hypertension   . Coronary artery disease involving coronary bypass graft of native heart with unstable angina pectoris ([place])   . Acute coronary syndrome ([place]) 07/19/2014  . Postoperative atrial fibrillation ([place]) 05/23/2014  . Coronary artery disease   . Hyperlipidemia   . Angina, class III ([place]) 04/13/2014  . Syncope 04/13/2014  . Exertional chest pain 03/29/2014  . SK (seborrheic keratosis) 12/30/2013  . Caregiver burden 12/30/2013  . Vertigo 04/28/2013  . Peripheral vascular disease ([place]) 10/03/2009  . DEGENERATIVE JOINT DISEASE 10/03/2009  . Obesity 05/12/2009  . NICOTINE ADDICTION 09/16/2007  . Prostate cancer ([place]) 09/16/2007   Current Outpatient Prescriptions on File Prior to Visit  Medication Sig Dispense Refill  . Albiglutide 50 MG PEN Inject 50 mg into the skin once a week. 4 each 3  . aspirin EC 81 MG tablet Take 1 tablet (81 mg total) by mouth daily. 90 tablet 3  . B-D ULTRAFINE III SHORT PEN 31G X 8 MM MISC USE AS DIRECTED TWICE DAILY 100 each 11  . bicalutamide (CASODEX) 50 MG tablet Take 50 mg by mouth daily.    . carvedilol (COREG) 3.125 MG tablet Take 1 tablet (3.125 mg total) by mouth 2 (two) times daily with a meal. 60 tablet 3  .  clopidogrel (PLAVIX) 75 MG tablet Take 1 tablet (75 mg total) by mouth daily with breakfast. 30 tablet 11  . clopidogrel (PLAVIX) 75 MG tablet Take 1 tablet (75 mg total) by mouth daily. 30 tablet 11  . ezetimibe (ZETIA) 10 MG tablet Take 1 tablet (10 mg total) by mouth daily. 30 tablet 3  . furosemide (LASIX) 40 MG tablet Take 1 tablet (40 mg total) by mouth daily as needed for fluid or edema. 30 tablet 3  . insulin aspart (NOVOLOG FLEXPEN) 100 UNIT/ML FlexPen Inject 36 Units into the skin 3 (three) times daily with meals. 20 mL 1  . Insulin Glargine (TOUJEO SOLOSTAR) 300 UNIT/ML SOPN Inject 110 Units into the skin at bedtime. 30 pen 1  . INVOKANA 100 MG TABS tablet TAKE ONE TABLET BY MOUTH DAILY 30 tablet 2  . leuprolide (LUPRON) 30 MG injection Inject 45 mg into the muscle every 6 (six) months.    Marland Kitchen losartan (COZAAR) 25 MG tablet Take 0.5 tablets (12.5 mg total) by mouth daily. 30 tablet 6  . metFORMIN (GLUCOPHAGE-XR) 500 MG 24 hr tablet Take 1 tablet (500 mg total) by mouth 2 (two) times daily with a meal. 60 tablet 1  . NITROSTAT 0.4 MG SL tablet Place 0.4 mg under the tongue as needed.   1  . ONE TOUCH ULTRA TEST test strip USE AS DIRECTED TO TEST BLOOD SUGARS TWICE DAILY 200 each 3  . potassium chloride  SA (K-DUR,KLOR-CON) 20 MEQ tablet Take 1 tablet (20 mEq total) by mouth daily as needed (Take one tabley if you take a Lasix tablet). 30 tablet 3  . pravastatin (PRAVACHOL) 40 MG tablet Take 1 tablet (40 mg total) by mouth every evening. 30 tablet 3   No current facility-administered medications on file prior to visit.   Allergies  Allergen Reactions  . Metformin And Related Nausea Only  . Statins     REACTION: JOINT ACHES    Objective: General: Patient is awake, alert, and oriented x 3 and in no acute distress.  Integument: Skin is warm, dry and supple bilateral. Nails are tender, long, thickened and  dystrophic with subungual debris, consistent with onychomycosis, 1-5 bilateral.  No signs of infection. No open lesions or preulcerative lesions present bilateral. Remaining integument unremarkable.  Vasculature:  Dorsalis Pedis pulse 1/4 bilateral. Posterior Tibial pulse 1/4 faint bilateral.  Capillary fill time <3 sec 1-5 bilateral. No hair growth to the level of the digits. Temperature gradient within normal limits. + varicosities present bilateral. No edema present bilateral.   Neurology: The patient has diminished sensation measured with a 5.07/10g Semmes Weinstein Monofilament to toes bilateral . Vibratory sensation diminished bilateral with tuning fork. No Babinski sign present bilateral.   Musculoskeletal: No gross pedal deformities noted bilateral. Muscular strength 5/5 in all lower extremity muscular groups bilateral without pain or limitation on range of motion . No tenderness with calf compression bilateral.  Assessment and Plan: Problem List Items Addressed This Visit      Cardiovascular and Mediastinum   Peripheral vascular disease ([place])    Other Visit Diagnoses    Dermatophytosis of nail    -  Primary    Pain of toe, unspecified laterality        Diabetic polyneuropathy associated with type 2 diabetes mellitus (HCC)        Diminished pulses in lower extremity          -Examined patient. -Discussed and educated patient on diabetic foot care, especially with  regards to the vascular, neurological and musculoskeletal systems.  -Stressed the importance of good glycemic control and the detriment of not  controlling glucose levels in relation to the foot. -Mechanically debrided all nails 1-5 bilateral using sterile nail nipper and filed with dremel without incident  -Answered all patient questions -Patient to return in 3 months for at risk foot care -Patient advised to call the office if any problems or questions arise in the meantime.  Landis Martins, DPM

## 2015-09-06 ENCOUNTER — Other Ambulatory Visit: Payer: Self-pay | Admitting: *Deleted

## 2015-09-06 MED ORDER — CANAGLIFLOZIN 100 MG PO TABS: 100.0000 mg | ORAL_TABLET | Freq: Every day | ORAL | Status: DC

## 2015-09-08 ENCOUNTER — Other Ambulatory Visit: Payer: BLUE CROSS/BLUE SHIELD

## 2015-09-13 ENCOUNTER — Other Ambulatory Visit (INDEPENDENT_AMBULATORY_CARE_PROVIDER_SITE_OTHER): Payer: BLUE CROSS/BLUE SHIELD

## 2015-09-13 DIAGNOSIS — E785 Hyperlipidemia, unspecified: Secondary | ICD-10-CM | POA: Diagnosis not present

## 2015-09-14 LAB — HEPATIC FUNCTION PANEL
ALT: 21 IU/L (ref 0–44)
AST: 19 IU/L (ref 0–40)
Albumin: 4.2 g/dL (ref 3.6–4.8)
Alkaline Phosphatase: 65 IU/L (ref 39–117)
Bilirubin Total: 0.6 mg/dL (ref 0.0–1.2)
Bilirubin, Direct: 0.17 mg/dL (ref 0.00–0.40)
Total Protein: 6.2 g/dL (ref 6.0–8.5)

## 2015-09-14 LAB — LIPID PANEL
Chol/HDL Ratio: 4.3 ratio units (ref 0.0–5.0)
Cholesterol, Total: 150 mg/dL (ref 100–199)
HDL: 35 mg/dL — ABNORMAL LOW (ref >39)
LDL Calculated: 90 mg/dL (ref 0–99)
Triglycerides: 126 mg/dL (ref 0–149)
VLDL Cholesterol Cal: 25 mg/dL (ref 5–40)

## 2015-09-29 ENCOUNTER — Other Ambulatory Visit: Payer: Self-pay | Admitting: *Deleted

## 2015-09-29 MED ORDER — INSULIN GLARGINE 300 UNIT/ML ~~LOC~~ SOPN: 110.0000 [IU] | PEN_INJECTOR | Freq: Every day | SUBCUTANEOUS | Status: DC

## 2015-10-10 ENCOUNTER — Other Ambulatory Visit: Payer: Self-pay | Admitting: *Deleted

## 2015-10-10 MED ORDER — GLUCOSE BLOOD VI STRP: ORAL_STRIP | Status: DC

## 2015-10-17 ENCOUNTER — Other Ambulatory Visit: Payer: Self-pay | Admitting: *Deleted

## 2015-10-17 MED ORDER — METFORMIN HCL ER 500 MG PO TB24: 500.0000 mg | ORAL_TABLET | Freq: Two times a day (BID) | ORAL | Status: DC

## 2015-11-03 ENCOUNTER — Ambulatory Visit (INDEPENDENT_AMBULATORY_CARE_PROVIDER_SITE_OTHER): Payer: BLUE CROSS/BLUE SHIELD | Admitting: Internal Medicine

## 2015-11-03 ENCOUNTER — Encounter: Payer: Self-pay | Admitting: Internal Medicine

## 2015-11-03 ENCOUNTER — Other Ambulatory Visit (INDEPENDENT_AMBULATORY_CARE_PROVIDER_SITE_OTHER): Payer: BLUE CROSS/BLUE SHIELD | Admitting: *Deleted

## 2015-11-03 VITALS — BP 112/62 | HR 62 | Temp 97.6°F | Resp 12 | Wt 275.0 lb

## 2015-11-03 DIAGNOSIS — E1159 Type 2 diabetes mellitus with other circulatory complications: Secondary | ICD-10-CM

## 2015-11-03 DIAGNOSIS — E1165 Type 2 diabetes mellitus with hyperglycemia: Secondary | ICD-10-CM

## 2015-11-03 LAB — POCT GLYCOSYLATED HEMOGLOBIN (HGB A1C): Hemoglobin A1C: 7.2

## 2015-11-03 NOTE — Progress Notes (Signed)
Patient ID: Phillip Clements, male   DOB: March 13, 1951, 65 y.o.   MRN: [id number]  HPI: Phillip Clements is a 65 y.o.-year-old male, returning for f/u for DM2, dx in 1996, insulin-dependent since 2008, uncontrolled, with complications (CAD - s/p stents, CABG, cerebro-vascular ds - s/p CVA). Last visit 3 mo ago.  6 weeks ago, he started walking 4x a week - 30 min - ~1 mile. His wife started a plant based diet for both of them. He is not very excited about this.  Last hemoglobin A1c was: Lab Results  Component Value Date   HGBA1C 7.8 08/05/2015   HGBA1C 7.1* 05/06/2015   HGBA1C 8.5* 02/01/2015   Pt is on a regimen of: - Invokana 100 mg daily - added 03/2015 - Tanzeum 50 mg weekly - Toujeo 110 (2x 55 units) at bedtime - Novolog 32 units 3x a day, before meals - + SSI - Metformin ER 500 mg 2x a day 3x week, the rest 500 mg 1x a day >> diarrhea He tried Victoza and Januvia Had GI intolerance (nausea) to Metformin.  Pt checks his sugars 1-3x a day and they are: - am: 173-250 >> 60-120 >> 158-198, 207 >> 130-160, 226 - 2h after b'fast: n/c - before lunch: 77, 82, 113-221, 340 >> 94-130 >> 82, 126-208 >> 73, 130-140, 180 - 2h after lunch: n/c - before dinner: 159, 167 >> 94-130 >> 136 >> 120s - 2h after dinner: n/c - bedtime: 198, 254 >> 120 >> 307 >> n/c - nighttime: n/c No lows. Lowest sugar was 77 >> 60s >> low while playing Golf; he has hypoglycemia awareness at 70s.  Highest sugar was 340 >> 220x1 >> 307 >> 200s.  Glucometer: OneTouch Ultra mini  Pt's meals are: - Breakfast: thin bagel + cream cheese + special K cinnamon cereal - Lunch: sandwich, chips, cottage cheese, pineapple - Dinner: meat + 2 veggies, salads - Snacks: 2/day: celery + PB; low salt triscuit + laughing cow cheese  Exercise: cardio + weights 2x a day; golf on wed.  - no CKD, last BUN/creatinine:  Lab Results  Component Value Date   BUN 20 07/26/2015   CREATININE 0.89 07/26/2015  On Losartan - last set of  lipids: Lab Results  Component Value Date   CHOL 150 09/13/2015   HDL 35* 09/13/2015   LDLCALC 90 09/13/2015   LDLDIRECT 157.1 09/07/2013   TRIG 126 09/13/2015   CHOLHDL 4.3 09/13/2015  Statins >> joint pain.  - last eye exam was in 05/30/2015. [facility]. No DR. Had cataract sx. - no numbness and tingling in his feet.  ROS: Constitutional: no weight gain/loss, no fatigue, no subjective hyperthermia/hypothermia, + nocturia Eyes: no blurry vision, no xerophthalmia ENT: no sore throat, no nodules palpated in throat, no dysphagia/odynophagia, no hoarseness Cardiovascular: no CP/SOB/palpitations/leg swelling Respiratory: no cough/SOB Gastrointestinal: no N/V/D/C Musculoskeletal: no muscle/joint aches Skin: no rashes Neurological: no tremors/numbness/tingling/dizziness  I reviewed pt's medications, allergies, PMH, social hx, family hx, and changes were documented in the history of present illness. Otherwise, unchanged from my initial visit note:  Past Medical History  Diagnosis Date  . Hypertension 2000  . Coronary artery disease     a. CABG 04/2014: (LIMA--> LAD, SVG --> DIAG, SVG--> OM1, SVG--> PDA)  b. early graft failure. s/p successful rotational atherectomy and stenting of the left main and proximal LAD with DES. DES of mid LAD.(07/22/14)  . Hyperlipidemia   . OSA on CPAP   . Type II diabetes  mellitus ([place])   . CVA (cerebral infarction)   . Peripheral vascular disease ([place])   . Stroke ([place])   . Prostate cancer ([place])   . Prostate cancer ([place])   . Prostate cancer ([place])   . [facility])    Past Surgical History  Procedure Laterality Date  . Prostate biopsy  04/03/06 & 02/25/07  . High intense focused ultrasound  07/20/06    By Dr. Jacqlyn Larsen  . Cytoscopy prostatic stone o/w nml  06/21/08    Dr. Jacqlyn Larsen  . Ett myoview  09/13/09    Low risk EF 49%  . Anterior cervical decomp/discectomy fusion  08/25/2012    Procedure: ANTERIOR CERVICAL DECOMPRESSION/DISCECTOMY FUSION  2 LEVELS;  Surgeon: Hosie Spangle, MD;  Location: [place] [facility]UR[facility] ORS;  Service: Neurosurgery;  Laterality: N/A;  Cervical five-six Cervical six-seven anterior cervical decompression with fusion and plating and bonegraft  . Intraoperative transesophageal echocardiogram N/A 04/26/2014    Procedure: INTRAOPERATIVE TRANSESOPHAGEAL ECHOCARDIOGRAM;  Surgeon: Ivin Poot, MD;  Location: [place];  Service: Open Heart Surgery;  Laterality: N/A;  . Coronary artery bypass graft N/A 04/26/2014    Procedure: CORONARY ARTERY BYPASS GRAFTING (CABG), on pump, times four, using left internal mammary artery, right greater saphenous vein harvested endoscopically.;  Surgeon: Ivin Poot, MD;  Location: [place];  Service: Open Heart Surgery;  Laterality: N/A;  LIMA to LAD, SVG to DIAGONAL, SVG to OM1, SVG to PDA) with EVH of the RIGHT THIGH and LOWER EXTREMITY SAPHENOUS VEIN  . Back surgery    . Percutaneous coronary rotoblator intervention (pci-r) N/A 07/22/2014    Procedure: PERCUTANEOUS CORONARY ROTOBLATOR INTERVENTION (PCI-R);  Surgeon: Peter M Martinique, MD;  [facility];  Service: Cardiovascular;  Laterality: N/A;  . Cardiac catheterization  04/2014; 07/19/2014  . Peripheral vascular catheterization Left 07/26/2015    Procedure: Lower Extremity Angiography;  Surgeon: Katha Cabal, MD;  Location: [place] CV LAB;  Service: Cardiovascular;  Laterality: Left;  . Peripheral vascular catheterization  07/26/2015    Procedure: Lower Extremity Intervention;  Surgeon: Katha Cabal, MD;  Location: [place] CV LAB;  Service: Cardiovascular;;   Social History   Social History  . Marital Status: Married    Spouse Name: N/A  . Number of Children: 3  . Years of Education: N/A   Occupational History  . Insurance account manager    Social History Main Topics  . Smoking status: Former Smoker -- 1.00 packs/day for 36 years    Types: Cigarettes    Quit date: 03/01/2014  .  Smokeless tobacco: Never Used  . Alcohol Use: Yes     Comment: 07/19/2014 "might have a drink a couple times/yr"  . Drug Use: No  . Sexual Activity: Not Currently   Other Topics Concern  . Not on file   Social History Narrative   Lives with wife.   2 daughters and 1 son.   [employer] fan   Runs [employer]   Current Outpatient Prescriptions on File Prior to Visit  Medication Sig Dispense Refill  . Albiglutide 50 MG PEN Inject 50 mg into the skin once a week. 4 each 3  . aspirin EC 81 MG tablet Take 1 tablet (81 mg total) by mouth daily. 90 tablet 3  . B-D ULTRAFINE III SHORT PEN 31G X 8 MM MISC USE AS DIRECTED TWICE DAILY 100 each 11  . bicalutamide (CASODEX) 50 MG tablet Take 50 mg by mouth daily.    Marland Kitchen  canagliflozin (INVOKANA) 100 MG TABS tablet Take 1 tablet (100 mg total) by mouth daily. 30 tablet 2  . carvedilol (COREG) 3.125 MG tablet Take 1 tablet (3.125 mg total) by mouth 2 (two) times daily with a meal. 60 tablet 3  . clopidogrel (PLAVIX) 75 MG tablet Take 1 tablet (75 mg total) by mouth daily with breakfast. 30 tablet 11  . clopidogrel (PLAVIX) 75 MG tablet Take 1 tablet (75 mg total) by mouth daily. 30 tablet 11  . ezetimibe (ZETIA) 10 MG tablet Take 1 tablet (10 mg total) by mouth daily. 30 tablet 3  . furosemide (LASIX) 40 MG tablet Take 1 tablet (40 mg total) by mouth daily as needed for fluid or edema. 30 tablet 3  . glucose blood (ONE TOUCH ULTRA TEST) test strip USE AS DIRECTED TO TEST BLOOD SUGARS TWICE DAILY.  Diagnosis:  E11.59   Insulin-dependent. 200 each 3  . insulin aspart (NOVOLOG FLEXPEN) 100 UNIT/ML FlexPen Inject 36 Units into the skin 3 (three) times daily with meals. 20 mL 1  . Insulin Glargine (TOUJEO SOLOSTAR) 300 UNIT/ML SOPN Inject 110 Units into the skin at bedtime. 30 pen 1  . leuprolide (LUPRON) 30 MG injection Inject 45 mg into the muscle every 6 (six) months.    Marland Kitchen losartan (COZAAR) 25 MG tablet Take 0.5 tablets (12.5 mg total) by mouth daily. 30  tablet 6  . metFORMIN (GLUCOPHAGE-XR) 500 MG 24 hr tablet Take 1 tablet (500 mg total) by mouth 2 (two) times daily with a meal. 60 tablet 1  . NITROSTAT 0.4 MG SL tablet Place 0.4 mg under the tongue as needed.   1  . potassium chloride SA (K-DUR,KLOR-CON) 20 MEQ tablet Take 1 tablet (20 mEq total) by mouth daily as needed (Take one tabley if you take a Lasix tablet). 30 tablet 3  . pravastatin (PRAVACHOL) 40 MG tablet Take 1 tablet (40 mg total) by mouth every evening. 30 tablet 3   No current facility-administered medications on file prior to visit.   Allergies  Allergen Reactions  . Metformin And Related Nausea Only  . Statins     REACTION: JOINT ACHES   Family History  Problem Relation Age of Onset  . Diabetes Mother 75    DM  . Coronary artery disease Mother   . Hypertension Mother   . Heart attack Mother     CABG with MI in 2005  . Heart disease Mother     CV  . Hypertension Father   . Heart attack Father     CABG with Mi around 1997  . Coronary artery disease Father   . Heart disease Father     CV  . Diabetes Father   . Heart attack Brother     MI/PTCA  . Heart disease Brother     CV  . Diabetes Brother   . Heart attack Maternal Grandfather     MI  . Prostate cancer Paternal Grandfather   . Breast cancer Neg Hx     Breast/ovarian/uterine cancer  . Colon cancer Neg Hx   . Depression Neg Hx   . Alcohol abuse Neg Hx     ETOH/drug abuse  . Stroke Neg Hx   . Diabetes Sister     DM   PE: BP 112/62 mmHg  Pulse 62  Temp(Src) 97.6 F (36.4 C) (Oral)  Resp 12  Wt 275 lb (124.739 kg)  SpO2 95% Body mass index is 40.59 kg/(m^2). Wt Readings from Last 3  Encounters:  11/03/15 275 lb (124.739 kg)  08/05/15 270 lb (122.471 kg)  07/26/15 269 lb (122.018 kg)   Constitutional: obese, in NAD Eyes: PERRLA, EOMI, no exophthalmos ENT: moist mucous membranes, no thyromegaly, no cervical lymphadenopathy Cardiovascular: RRR, No MRG Respiratory: CTA B Gastrointestinal:  abdomen protuberant, soft, NT, ND, BS+ Musculoskeletal: no deformities, strength intact in all 4 Skin: moist, warm, no rashes Neurological: no tremor with outstretched hands, DTR normal in all 4  ASSESSMENT: 1. DM2, insulin-dependent, uncontrolled, with complications - CAD - s/p stents, CABG - cerebro-vascular ds - s/p CVA   PLAN:  1. Patient with long-standing, uncontrolled diabetes, now with better control on basal - bolus insulin regimen + GLP1 R agonist and SGLT2 inhibitor >> sugars better but still high mostly in am. Not checking sugars at bedtime >> advised to start doing so and increase the Novolog before dinner if they are high.  - will decrease Metformin er since he gets diarrhea from it, but will keep 2x a day Metformin on his med list as I advised him to try this regimen again in the future - we again discussed about increasing Invokana >> would not want to do this as he has increased urination already. - I suggested to:  Patient Instructions  Please continue: - Invokana 100 mg daily  - Tanzeum 50 mg weekly - Toujeo 110 (2x 55 units) at bedtime - Novolog 32 units 3x a day, before meals, with sliding scale  Check sugars before and 2h after dinner. If the after-meal sugar >50, then increase the dinnertime NovoLog by 4 units.  Decrease Metformin ER to 500 mg with dinner.  Please come back for a follow-up appointment in 3 months.  - continue checking sugars at different times of the day - check 3 times a day, rotating checks - advised for yearly eye exams >> he is UTD - will check HbA1c today >> 7.2% (better) - Return to clinic in 3 mo with sugar log

## 2015-11-03 NOTE — Patient Instructions (Signed)
Please continue: - Invokana 100 mg daily  - Tanzeum 50 mg weekly - Toujeo 110 (2x 55 units) at bedtime - Novolog 32 units 3x a day, before meals, with sliding scale  Check sugars before and 2h after dinner. If the after-meal sugar >50, then increase the dinnertime NovoLog by 4 units.  Decrease Metformin ER to 500 mg with dinner.  Please come back for a follow-up appointment in 3 months

## 2015-11-09 ENCOUNTER — Encounter: Payer: BLUE CROSS/BLUE SHIELD | Admitting: Cardiothoracic Surgery

## 2015-11-11 ENCOUNTER — Other Ambulatory Visit: Payer: Self-pay

## 2015-11-11 ENCOUNTER — Ambulatory Visit (INDEPENDENT_AMBULATORY_CARE_PROVIDER_SITE_OTHER): Payer: BLUE CROSS/BLUE SHIELD | Admitting: Sports Medicine

## 2015-11-11 ENCOUNTER — Other Ambulatory Visit: Payer: Self-pay | Admitting: *Deleted

## 2015-11-11 ENCOUNTER — Encounter: Payer: Self-pay | Admitting: Sports Medicine

## 2015-11-11 DIAGNOSIS — I739 Peripheral vascular disease, unspecified: Secondary | ICD-10-CM | POA: Diagnosis not present

## 2015-11-11 DIAGNOSIS — I10 Essential (primary) hypertension: Secondary | ICD-10-CM

## 2015-11-11 DIAGNOSIS — I2510 Atherosclerotic heart disease of native coronary artery without angina pectoris: Secondary | ICD-10-CM

## 2015-11-11 DIAGNOSIS — E1142 Type 2 diabetes mellitus with diabetic polyneuropathy: Secondary | ICD-10-CM | POA: Diagnosis not present

## 2015-11-11 DIAGNOSIS — B351 Tinea unguium: Secondary | ICD-10-CM

## 2015-11-11 DIAGNOSIS — M79676 Pain in unspecified toe(s): Secondary | ICD-10-CM

## 2015-11-11 DIAGNOSIS — R079 Chest pain, unspecified: Secondary | ICD-10-CM

## 2015-11-11 DIAGNOSIS — E785 Hyperlipidemia, unspecified: Secondary | ICD-10-CM

## 2015-11-11 MED ORDER — LOSARTAN POTASSIUM 25 MG PO TABS: 12.5000 mg | ORAL_TABLET | Freq: Every day | ORAL | Status: DC

## 2015-11-11 MED ORDER — ALBIGLUTIDE 50 MG ~~LOC~~ PEN: 50.0000 mg | PEN_INJECTOR | SUBCUTANEOUS | Status: DC

## 2015-11-11 NOTE — Telephone Encounter (Signed)
Refill sent for Losartan potassium 25 mg 1/2 tablet daily.

## 2015-11-11 NOTE — Patient Instructions (Signed)
Epsom salt soaks daily for 1 week and cover with neosporin and bandaid

## 2015-11-11 NOTE — Progress Notes (Signed)
Patient ID: Phillip Clements, male   DOB: 12-08-1950, 65 y.o.   MRN: [id number]  Subjective: Phillip Clements is a 65 y.o. male patient with history of type 2 diabetes who presents to office today complaining of long, painful nails  while ambulating in shoes; unable to trim. Admits that he is getting some pain and thickness at both big toenails with ingrowing and wants to know his treatment options for the changes that are occurring at the nails. Patient states that the glucose reading this morning was not recorded but A1c is 7.1. Patient denies any new changes in medication or new problems. Patient denies any new cramping, numbness, burning or tingling in the legs.  Patient Active Problem List   Diagnosis Date Noted  . Poorly controlled type 2 diabetes mellitus with circulatory disorder ([place]) 03/18/2015  . CVA (cerebral vascular accident) ([place]) 07/23/2014  . CVA (cerebral infarction)   . OSA on CPAP   . Hypertension   . Coronary artery disease involving coronary bypass graft of native heart with unstable angina pectoris ([place])   . Acute coronary syndrome ([place]) 07/19/2014  . Postoperative atrial fibrillation ([place]) 05/23/2014  . Coronary artery disease   . Hyperlipidemia   . Angina, class III ([place]) 04/13/2014  . Syncope 04/13/2014  . Exertional chest pain 03/29/2014  . SK (seborrheic keratosis) 12/30/2013  . Caregiver burden 12/30/2013  . Vertigo 04/28/2013  . Peripheral vascular disease ([place]) 10/03/2009  . DEGENERATIVE JOINT DISEASE 10/03/2009  . Obesity 05/12/2009  . NICOTINE ADDICTION 09/16/2007  . Prostate cancer ([place]) 09/16/2007   Current Outpatient Prescriptions on File Prior to Visit  Medication Sig Dispense Refill  . Albiglutide 50 MG PEN Inject 50 mg into the skin once a week. 4 each 3  . aspirin EC 81 MG tablet Take 1 tablet (81 mg total) by mouth daily. 90 tablet 3  . B-D ULTRAFINE III SHORT PEN 31G X 8 MM MISC USE AS DIRECTED TWICE DAILY 100 each 11  . bicalutamide (CASODEX)  50 MG tablet Take 50 mg by mouth daily.    . canagliflozin (INVOKANA) 100 MG TABS tablet Take 1 tablet (100 mg total) by mouth daily. 30 tablet 2  . carvedilol (COREG) 3.125 MG tablet Take 1 tablet (3.125 mg total) by mouth 2 (two) times daily with a meal. 60 tablet 3  . clopidogrel (PLAVIX) 75 MG tablet Take 1 tablet (75 mg total) by mouth daily with breakfast. 30 tablet 11  . clopidogrel (PLAVIX) 75 MG tablet Take 1 tablet (75 mg total) by mouth daily. 30 tablet 11  . ezetimibe (ZETIA) 10 MG tablet Take 1 tablet (10 mg total) by mouth daily. 30 tablet 3  . furosemide (LASIX) 40 MG tablet Take 1 tablet (40 mg total) by mouth daily as needed for fluid or edema. 30 tablet 3  . glucose blood (ONE TOUCH ULTRA TEST) test strip USE AS DIRECTED TO TEST BLOOD SUGARS TWICE DAILY.  Diagnosis:  E11.59   Insulin-dependent. 200 each 3  . insulin aspart (NOVOLOG FLEXPEN) 100 UNIT/ML FlexPen Inject 36 Units into the skin 3 (three) times daily with meals. 20 mL 1  . Insulin Glargine (TOUJEO SOLOSTAR) 300 UNIT/ML SOPN Inject 110 Units into the skin at bedtime. 30 pen 1  . leuprolide (LUPRON) 30 MG injection Inject 45 mg into the muscle every 6 (six) months.    Marland Kitchen losartan (COZAAR) 25 MG tablet Take 0.5 tablets (12.5 mg total) by mouth daily. 30 tablet 6  . metFORMIN (GLUCOPHAGE-XR)  500 MG 24 hr tablet Take 1 tablet (500 mg total) by mouth 2 (two) times daily with a meal. 60 tablet 1  . NITROSTAT 0.4 MG SL tablet Place 0.4 mg under the tongue as needed.   1  . potassium chloride SA (K-DUR,KLOR-CON) 20 MEQ tablet Take 1 tablet (20 mEq total) by mouth daily as needed (Take one tabley if you take a Lasix tablet). 30 tablet 3  . pravastatin (PRAVACHOL) 40 MG tablet Take 1 tablet (40 mg total) by mouth every evening. 30 tablet 3   No current facility-administered medications on file prior to visit.   Allergies  Allergen Reactions  . Metformin And Related Nausea Only  . Statins     REACTION: JOINT ACHES     Objective: General: Patient is awake, alert, and oriented x 3 and in no acute distress.  Integument: Skin is warm, dry and supple bilateral. Nails are tender, long, thickened and  dystrophic with subungual debris, consistent with onychomycosis, 1-5 bilateral with the bilateral hallux nails being most involved with mild early ingrowing and a pincher nail deformity, right greater than left. No signs of infection. No open lesions or preulcerative lesions present bilateral. Remaining integument unremarkable.  Vasculature:  Dorsalis Pedis pulse 1/4 bilateral. Posterior Tibial pulse 1/4 faint bilateral.  Capillary fill time <3 sec 1-5 bilateral. No hair growth to the level of the digits. Temperature gradient within normal limits. + varicosities and venous hyperpigmentation present bilateral. No edema present bilateral.   Neurology: The patient has diminished sensation measured with a 5.07/10g Semmes Weinstein Monofilament to toes bilateral . Vibratory sensation diminished bilateral with tuning fork. No Babinski sign present bilateral.   Musculoskeletal: No gross pedal deformities noted bilateral. Muscular strength 5/5 in all lower extremity muscular groups bilateral without pain or limitation on range of motion . No tenderness with calf compression bilateral.  Assessment and Plan: Problem List Items Addressed This Visit      Cardiovascular and Mediastinum   Peripheral vascular disease ([place])    Other Visit Diagnoses    Dermatophytosis of nail    -  Primary    Pain of toe, unspecified laterality        Diabetic polyneuropathy associated with type 2 diabetes mellitus ([place])          -Examined patient. -Discussed and educated patient on diabetic foot care, especially with  regards to the vascular, neurological and musculoskeletal systems.  -Stressed the importance of good glycemic control and the detriment of not  controlling glucose levels in relation to the foot. -Mechanically debrided  all nails 1-5 bilateral using sterile nail nipper and filed with dremel without incident and obtained fungal culture from bilateral hallux nails to be sent to [place]; will discuss results at next visit and determine treatment. Encouraged patient to consider nail procedures since both hallux nails are severely deformed consider total nail avulsion, permanent versus temporary; patient expressed understanding and states that at next visit he will decide on procedure -Answered all patient questions -Patient to return in 1 month for review of fungal culture results and possible nail procedure -Patient advised to call the office if any problems or questions arise in the meantime.  Landis Martins, DPM

## 2015-11-16 ENCOUNTER — Encounter: Payer: BLUE CROSS/BLUE SHIELD | Admitting: Cardiothoracic Surgery

## 2015-11-16 ENCOUNTER — Ambulatory Visit (INDEPENDENT_AMBULATORY_CARE_PROVIDER_SITE_OTHER): Payer: BLUE CROSS/BLUE SHIELD | Admitting: Cardiothoracic Surgery

## 2015-11-16 ENCOUNTER — Encounter: Payer: Self-pay | Admitting: Cardiothoracic Surgery

## 2015-11-16 VITALS — BP 146/75 | HR 75 | Resp 20 | Ht 69.0 in | Wt 275.0 lb

## 2015-11-16 DIAGNOSIS — I739 Peripheral vascular disease, unspecified: Secondary | ICD-10-CM

## 2015-11-16 DIAGNOSIS — I340 Nonrheumatic mitral (valve) insufficiency: Secondary | ICD-10-CM | POA: Diagnosis not present

## 2015-11-16 DIAGNOSIS — Z951 Presence of aortocoronary bypass graft: Secondary | ICD-10-CM | POA: Diagnosis not present

## 2015-11-16 DIAGNOSIS — I25119 Atherosclerotic heart disease of native coronary artery with unspecified angina pectoris: Secondary | ICD-10-CM

## 2015-11-16 NOTE — Progress Notes (Signed)
PCP is Elsie Stain, MD Referring Provider is Wellington Hampshire, MD  Chief Complaint  Patient presents with  . Routine Post Op    4 month f/u     HPI:the patient returns for routine followup after undergoing multivessel CABG in late 2015. The patient is an obese previously poorly controlled diabetic. 6 months after surgery he developed early graft closure and underwent PCI of his LAD. Since then he's been doing well without recurrent angina. He is been followed by Dr.Arida and is on Plavix.  Last echocardiogram late 2016-ejection fraction 60% with mild-moderate MR   Within the past few months he underwent a left SFA dilatation-stent for claudication and has done fairly well. The patient has committed to a structured aerobic exercise program, he goes to a fitness center 4 days a week and walks on a treadmill for 30 minutes.patient states his blood sugars are under better control under the direction of endocrinologist.   Past Medical History  Diagnosis Date  . Hypertension 2000  . Coronary artery disease     a. CABG 04/2014: (LIMA--> LAD, SVG --> DIAG, SVG--> OM1, SVG--> PDA)  b. early graft failure. s/p successful rotational atherectomy and stenting of the left main and proximal LAD with DES. DES of mid LAD.(07/22/14)  . Hyperlipidemia   . OSA on CPAP   . Type II diabetes mellitus ([place])   . CVA (cerebral infarction)   . Peripheral vascular disease ([place])   . Stroke ([place])   . Prostate cancer ([place])   . Prostate cancer ([place])   . Prostate cancer ([place])   . Prostate cancer [facility])     Past Surgical History  Procedure Laterality Date  . Prostate biopsy  04/03/06 & 02/25/07  . High intense focused ultrasound  07/20/06    By Dr. Jacqlyn Larsen  . Cytoscopy prostatic stone o/w nml  06/21/08    Dr. Jacqlyn Larsen  . Ett myoview  09/13/09    Low risk EF 49%  . Anterior cervical decomp/discectomy fusion  08/25/2012    Procedure: ANTERIOR CERVICAL DECOMPRESSION/DISCECTOMY FUSION 2 LEVELS;  Surgeon: Hosie Spangle,  MD;  Location: [place] [facility]UR[facility] OR[facility];  Service: Neurosurgery;  Laterality: N/A;  Cervical five-six Cervical six-seven anterior cervical decompression with fusion and plating and bonegraft  . Intraoperative transesophageal echocardiogram N/A 04/26/2014    Procedure: INTRAOPERATIVE TRANSESOPHAGEAL ECHOCARDIOGRAM;  Surgeon: Ivin Poot, MD;  Location: [place];  Service: Open Heart Surgery;  Laterality: N/A;  . Coronary artery bypass graft N/A 04/26/2014    Procedure: CORONARY ARTERY BYPASS GRAFTING (CABG), on pump, times four, using left internal mammary artery, right greater saphenous vein harvested endoscopically.;  Surgeon: Ivin Poot, MD;  Location: [place];  Service: Open Heart Surgery;  Laterality: N/A;  LIMA to LAD, SVG to DIAGONAL, SVG to OM1, SVG to PDA) with EVH of the RIGHT THIGH and LOWER EXTREMITY SAPHENOUS VEIN  . Back surgery    . Percutaneous coronary rotoblator intervention (pci-r) N/A 07/22/2014    Procedure: PERCUTANEOUS CORONARY ROTOBLATOR INTERVENTION (PCI-R);  Surgeon: Kelcey Wickstrom M [place], MD;  Location: [facility] CATH LAB;  Service: Cardiovascular;  Laterality: N/A;  . Cardiac catheterization  04/2014; 07/19/2014  . Peripheral vascular catheterization Left 07/26/2015    Procedure: Lower Extremity Angiography;  Surgeon: Katha Cabal, MD;  Location: [place] CV LAB;  Service: Cardiovascular;  Laterality: Left;  . Peripheral vascular catheterization  07/26/2015    Procedure: Lower Extremity Intervention;  Surgeon: Katha Cabal, MD;  Location: [place] CV LAB;  Service: Cardiovascular;;    Family History  Problem Relation Age of Onset  . Diabetes Mother 64    DM  . Coronary artery disease Mother   . Hypertension Mother   . Heart attack Mother     CABG with MI in 2005  . Heart disease Mother     CV  . Hypertension Father   . Heart attack Father     CABG with Mi around 1997  . Coronary artery disease Father   . Heart disease Father     CV  . Diabetes Father   . Heart  attack Brother     MI/PTCA  . Heart disease Brother     CV  . Diabetes Brother   . Heart attack Maternal Grandfather     MI  . Prostate cancer Paternal Grandfather   . Breast cancer Neg Hx     Breast/ovarian/uterine cancer  . Colon cancer Neg Hx   . Depression Neg Hx   . Alcohol abuse Neg Hx     ETOH/drug abuse  . Stroke Neg Hx   . Diabetes Sister     DM    Social History Social History  Substance Use Topics  . Smoking status: Former Smoker -- 1.00 packs/day for 36 years    Types: Cigarettes    Quit date: 03/01/2014  . Smokeless tobacco: Never Used  . Alcohol Use: Yes     Comment: 07/19/2014 "might have a drink a couple times/yr"    Current Outpatient Prescriptions  Medication Sig Dispense Refill  . Albiglutide 50 MG PEN Inject 50 mg into the skin once a week. 4 each 3  . aspirin EC 81 MG tablet Take 1 tablet (81 mg total) by mouth daily. 90 tablet 3  . B-D ULTRAFINE III SHORT PEN 31G X 8 MM MISC USE AS DIRECTED TWICE DAILY 100 each 11  . bicalutamide (CASODEX) 50 MG tablet Take 50 mg by mouth daily.    . canagliflozin (INVOKANA) 100 MG TABS tablet Take 1 tablet (100 mg total) by mouth daily. 30 tablet 2  . carvedilol (COREG) 3.125 MG tablet Take 1 tablet (3.125 mg total) by mouth 2 (two) times daily with a meal. 60 tablet 3  . clopidogrel (PLAVIX) 75 MG tablet Take 1 tablet (75 mg total) by mouth daily. 30 tablet 11  . ezetimibe (ZETIA) 10 MG tablet Take 1 tablet (10 mg total) by mouth daily. 30 tablet 3  . furosemide (LASIX) 40 MG tablet Take 1 tablet (40 mg total) by mouth daily as needed for fluid or edema. 30 tablet 3  . glucose blood (ONE TOUCH ULTRA TEST) test strip USE AS DIRECTED TO TEST BLOOD SUGARS TWICE DAILY.  Diagnosis:  E11.59   Insulin-dependent. 200 each 3  . insulin aspart (NOVOLOG FLEXPEN) 100 UNIT/ML FlexPen Inject 36 Units into the skin 3 (three) times daily with meals. 20 mL 1  . Insulin Glargine (TOUJEO SOLOSTAR) 300 UNIT/ML SOPN Inject 110 Units into  the skin at bedtime. 30 pen 1  . leuprolide (LUPRON) 30 MG injection Inject 45 mg into the muscle every 6 (six) months.    Marland Kitchen losartan (COZAAR) 25 MG tablet Take 0.5 tablets (12.5 mg total) by mouth daily. 30 tablet 6  . metFORMIN (GLUCOPHAGE-XR) 500 MG 24 hr tablet Take 1 tablet (500 mg total) by mouth 2 (two) times daily with a meal. (Patient taking differently: Take 500 mg by mouth daily. ) 60 tablet 1  . NITROSTAT 0.4 MG SL tablet  Place 0.4 mg under the tongue as needed.   1  . potassium chloride SA (K-DUR,KLOR-CON) 20 MEQ tablet Take 1 tablet (20 mEq total) by mouth daily as needed (Take one tabley if you take a Lasix tablet). 30 tablet 3  . pravastatin (PRAVACHOL) 40 MG tablet Take 1 tablet (40 mg total) by mouth every evening. 30 tablet 3   No current facility-administered medications for this visit.    Allergies  Allergen Reactions  . Metformin And Related Nausea Only  . Rosuvastatin Calcium     REACTION: JOINT ACHES  . Statins     REACTION: JOINT ACHES    Review of Systems  No angina No orthopnea or PND Minimal pedal edema Shortness of breath with exertion  BP 146/75 mmHg  Pulse 75  Resp 20  Ht 5\' 9"  (1.753 m)  Wt 275 lb (124.739 kg)  BMI 40.59 kg/m2  SpO2 95% Physical Exam Patient is obese, no distress but appears depressed HEENT normocephalic Breath sounds clear Heart rate regular without murmur or gallop Abdomen obese soft Extremities warm  Diagnostic Tests: No chest x-ray today  Impression: Stable multivessel CAD Doing well with left main stent and medical therapy  Plan:return in 6 months to monitor cardiac status   Len Childs, MD [facility] [phone]

## 2015-11-28 ENCOUNTER — Other Ambulatory Visit: Payer: Self-pay | Admitting: *Deleted

## 2015-11-28 ENCOUNTER — Other Ambulatory Visit: Payer: Self-pay

## 2015-11-28 DIAGNOSIS — E785 Hyperlipidemia, unspecified: Secondary | ICD-10-CM

## 2015-11-28 MED ORDER — EZETIMIBE 10 MG PO TABS: 10.0000 mg | ORAL_TABLET | Freq: Every day | ORAL | Status: DC

## 2015-11-28 MED ORDER — INSULIN GLARGINE 300 UNIT/ML ~~LOC~~ SOPN: 110.0000 [IU] | PEN_INJECTOR | Freq: Every day | SUBCUTANEOUS | Status: DC

## 2015-11-28 MED ORDER — PRAVASTATIN SODIUM 40 MG PO TABS
40.0000 mg | ORAL_TABLET | Freq: Every evening | ORAL | Status: DC
Start: 2015-11-28 — End: 2016-01-19

## 2015-11-28 NOTE — Telephone Encounter (Signed)
Refill sent for Zetia 10 mg  

## 2015-11-28 NOTE — Telephone Encounter (Signed)
Refill sent for Pravastatin.  

## 2015-12-07 ENCOUNTER — Other Ambulatory Visit: Payer: Self-pay | Admitting: *Deleted

## 2015-12-07 MED ORDER — INSULIN ASPART 100 UNIT/ML FLEXPEN: 32.0000 [IU] | PEN_INJECTOR | Freq: Three times a day (TID) | SUBCUTANEOUS | Status: DC

## 2015-12-07 MED ORDER — CANAGLIFLOZIN 100 MG PO TABS: 100.0000 mg | ORAL_TABLET | Freq: Every day | ORAL | Status: DC

## 2015-12-16 ENCOUNTER — Encounter: Payer: Self-pay | Admitting: Sports Medicine

## 2015-12-16 ENCOUNTER — Ambulatory Visit (INDEPENDENT_AMBULATORY_CARE_PROVIDER_SITE_OTHER): Payer: BLUE CROSS/BLUE SHIELD | Admitting: Sports Medicine

## 2015-12-16 DIAGNOSIS — I739 Peripheral vascular disease, unspecified: Secondary | ICD-10-CM

## 2015-12-16 DIAGNOSIS — B351 Tinea unguium: Secondary | ICD-10-CM

## 2015-12-16 DIAGNOSIS — E1142 Type 2 diabetes mellitus with diabetic polyneuropathy: Secondary | ICD-10-CM

## 2015-12-16 DIAGNOSIS — M79676 Pain in unspecified toe(s): Secondary | ICD-10-CM

## 2015-12-16 NOTE — Progress Notes (Signed)
Patient ID: Phillip Clements, male   DOB: [birth year], 65 y.o.   MRN: [id number] Subjective: Phillip Clements is a 65 y.o. Diabetic male patient seen today in office for fungal culture results and to discuss possible nail procedure. Patient has no other pedal complaints at this time.   FBS not recorded  Patient Active Problem List   Diagnosis Date Noted  . Poorly controlled type 2 diabetes mellitus with circulatory disorder ([place]) 03/18/2015  . CVA (cerebral vascular accident) ([place]) 07/23/2014  . CVA (cerebral infarction)   . OSA on CPAP   . Hypertension   . Coronary artery disease involving coronary bypass graft of native heart with unstable angina pectoris ([place])   . Acute coronary syndrome ([place]) 07/19/2014  . Postoperative atrial fibrillation ([place]) 05/23/2014  . Coronary artery disease   . Hyperlipidemia   . Angina, class III ([place]) 04/13/2014  . Syncope 04/13/2014  . Exertional chest pain 03/29/2014  . SK (seborrheic keratosis) 12/30/2013  . Caregiver burden 12/30/2013  . 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 Vertigo 04/28/2013  . Genuine stress incontinence, male 05/20/2012  . Disorder of bladder function 05/13/2012  . 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 Incomplete bladder emptying 05/13/2012  . Malignant neoplasm of prostate ([place]) 05/13/2012  . Malignant neoplasm metastatic to bone ([place]) 05/13/2012  . Infection of urinary tract 05/13/2012  . Burning or prickling sensation 12/03/2011  . Elevated blood pressure reading without diagnosis of hypertension 07/30/2011  . Cerebrovascular accident (CVA) ([place]) 07/30/2011  . Arthritis, degenerative 07/30/2011  . Pure hypercholesterolemia 07/30/2011  . Presbyopia 07/30/2011  . Type 2 diabetes mellitus ([place]) 07/30/2011  . Peripheral vascular disease ([place]) 10/03/2009  . DEGENERATIVE JOINT DISEASE 10/03/2009  . Artery disease, cerebral 07/18/2009  . Obesity 05/12/2009  . Benign hypertension 06/29/2008  . History  of tobacco use 06/29/2008  . NICOTINE ADDICTION 09/16/2007  . Prostate cancer ([place]) 09/16/2007    Current Outpatient Prescriptions on File Prior to Visit  Medication Sig Dispense Refill  . Albiglutide 50 MG PEN Inject 50 mg into the skin once a week. 4 each 3  . aspirin EC 81 MG tablet Take 1 tablet (81 mg total) by mouth daily. 90 tablet 3  . B-D ULTRAFINE III SHORT PEN 31G X 8 MM MISC USE AS DIRECTED TWICE DAILY 100 each 11  . bicalutamide (CASODEX) 50 MG tablet Take 50 mg by mouth daily.    . canagliflozin (INVOKANA) 100 MG TABS tablet Take 1 tablet (100 mg total) by mouth daily. 30 tablet 2  . carvedilol (COREG) 3.125 MG tablet Take 1 tablet (3.125 mg total) by mouth 2 (two) times daily with a meal. 60 tablet 3  . clopidogrel (PLAVIX) 75 MG tablet Take 1 tablet (75 mg total) by mouth daily. 30 tablet 11  . ezetimibe (ZETIA) 10 MG tablet Take 1 tablet (10 mg total) by mouth daily. 30 tablet 3  . furosemide (LASIX) 40 MG tablet Take 1 tablet (40 mg total) by mouth daily as needed for fluid or edema. 30 tablet 3  . glucose blood (ONE TOUCH ULTRA TEST) test strip USE AS DIRECTED TO TEST BLOOD SUGARS TWICE DAILY.  Diagnosis:  E11.59   Insulin-dependent. 200 each 3  . insulin aspart (NOVOLOG FLEXPEN) 100 UNIT/ML FlexPen Inject 32 Units into the skin 3 (three) times daily with meals. 30 mL 2  . Insulin Glargine (TOUJEO SOLOSTAR) 300 UNIT/ML SOPN Inject 110 Units into the skin at bedtime. 30 pen 2  .  leuprolide (LUPRON) 30 MG injection Inject 45 mg into the muscle every 6 (six) months.    Marland Kitchen losartan (COZAAR) 25 MG tablet Take 0.5 tablets (12.5 mg total) by mouth daily. 30 tablet 6  . metFORMIN (GLUCOPHAGE-XR) 500 MG 24 hr tablet Take 1 tablet (500 mg total) by mouth 2 (two) times daily with a meal. (Patient taking differently: Take 500 mg by mouth daily. ) 60 tablet 1  . NITROSTAT 0.4 MG SL tablet Place 0.4 mg under the tongue as needed.   1  . potassium chloride SA (K-DUR,KLOR-CON) 20 MEQ tablet  Take 1 tablet (20 mEq total) by mouth daily as needed (Take one tabley if you take a Lasix tablet). 30 tablet 3  . pravastatin (PRAVACHOL) 40 MG tablet Take 1 tablet (40 mg total) by mouth every evening. 30 tablet 3   No current facility-administered medications on file prior to visit.    Allergies  Allergen Reactions  . Metformin And Related Nausea Only  . Rosuvastatin Calcium     REACTION: JOINT ACHES  . Statins     REACTION: JOINT ACHES    Objective: Physical Exam  General: Well developed, nourished, no acute distress, awake, alert and oriented x 3  Vascular: Dorsalis pedis artery 1/4 bilateral, Posterior tibial artery 1/4 bilateral, skin temperature warm to warm proximal to distal bilateral lower extremities, + varicosities with venous hyperpigmentation, no pedal hair present bilateral.  Neurological: Gross sensation present via light touch bilateral. Vibratory and Protective sensation diminished bilateral.   Dermatological: Skin is warm, dry, and supple bilateral, Nails 1-10 are tender, short thick, and discolored with mild subungal debris with left hallux early ingrowing and right hallux with pincher nail deformity, no webspace macerations present bilateral, no open lesions present bilateral, no callus/corns/hyperkeratotic tissue present bilateral. No signs of infection bilateral.  Musculoskeletal: No boney deformities noted bilateral. Muscular strength within normal limits without painon range of motion. No pain with calf compression bilateral.  Fungal culture- microtrauma  Assessment and Plan:  Problem List Items Addressed This Visit      Cardiovascular and Mediastinum   Peripheral vascular disease ([place])    Other Visit Diagnoses    Dermatophytosis of nail    -  Primary    Pain of toe, unspecified laterality        Diabetic polyneuropathy associated with type 2 diabetes mellitus ([place])          -Examined patient -Cont with daily inspection of feet in the setting of  diabetes with neuropathy  -Discussed treatment options for painful dystrophic nails -Patient opt for nail removal; patient would like to have left hallux nail removed at a later time. -All questions answered -Patient to return in 1 week for nail procedure or sooner if symptoms worsen.  Landis Martins, DPM

## 2015-12-23 ENCOUNTER — Encounter: Payer: Self-pay | Admitting: Sports Medicine

## 2015-12-23 ENCOUNTER — Ambulatory Visit (INDEPENDENT_AMBULATORY_CARE_PROVIDER_SITE_OTHER): Payer: BLUE CROSS/BLUE SHIELD | Admitting: Sports Medicine

## 2015-12-23 DIAGNOSIS — E1142 Type 2 diabetes mellitus with diabetic polyneuropathy: Secondary | ICD-10-CM | POA: Diagnosis not present

## 2015-12-23 DIAGNOSIS — L603 Nail dystrophy: Secondary | ICD-10-CM | POA: Diagnosis not present

## 2015-12-23 DIAGNOSIS — L600 Ingrowing nail: Secondary | ICD-10-CM

## 2015-12-23 DIAGNOSIS — M79676 Pain in unspecified toe(s): Secondary | ICD-10-CM

## 2015-12-23 NOTE — Progress Notes (Signed)
Patient ID: Phillip Clements, male   DOB: 1951/02/15, 65 y.o.   MRN: [id number] Subjective: Phillip Clements is a 65 y.o. male patient presents to office today complaining of a painful thickened 1st toenail on left foot. Patient desires removal of nail. Fungal culture suggests microtrauma. Patient denies fever/chills/nausea/vomitting/any other related constitutional symptoms at this time.  Patient Active Problem List   Diagnosis Date Noted  . Poorly controlled type 2 diabetes mellitus with circulatory disorder ([place]) 03/18/2015  . CVA (cerebral vascular accident) ([place]) 07/23/2014  . CVA (cerebral infarction)   . OSA on CPAP   . Hypertension   . Coronary artery disease involving coronary bypass graft of native heart with unstable angina pectoris ([place])   . Acute coronary syndrome ([place]) 07/19/2014  . Postoperative atrial fibrillation ([place]) 05/23/2014  . Coronary artery disease   . Hyperlipidemia   . Angina, class III ([place]) 04/13/2014  . Syncope 04/13/2014  . Exertional chest pain 03/29/2014  . SK (seborrheic keratosis) 12/30/2013  . Caregiver burden 12/30/2013  . 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 Vertigo 04/28/2013  . Genuine stress incontinence, male 05/20/2012  . Disorder of bladder function 05/13/2012  . 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 Incomplete bladder emptying 05/13/2012  . Malignant neoplasm of prostate ([place]) 05/13/2012  . Malignant neoplasm metastatic to bone ([place]) 05/13/2012  . Infection of urinary tract 05/13/2012  . Burning or prickling sensation 12/03/2011  . Elevated blood pressure reading without diagnosis of hypertension 07/30/2011  . Cerebrovascular accident (CVA) ([place]) 07/30/2011  . Arthritis, degenerative 07/30/2011  . Pure hypercholesterolemia 07/30/2011  . Presbyopia 07/30/2011  . Type 2 diabetes mellitus ([place]) 07/30/2011  . Peripheral vascular disease ([place]) 10/03/2009  . DEGENERATIVE JOINT DISEASE 10/03/2009  . Artery disease,  cerebral 07/18/2009  . Obesity 05/12/2009  . Benign hypertension 06/29/2008  . History of tobacco use 06/29/2008  . NICOTINE ADDICTION 09/16/2007  . Prostate cancer ([place]) 09/16/2007    Current Outpatient Prescriptions on File Prior to Visit  Medication Sig Dispense Refill  . Albiglutide 50 MG PEN Inject 50 mg into the skin once a week. 4 each 3  . aspirin EC 81 MG tablet Take 1 tablet (81 mg total) by mouth daily. 90 tablet 3  . B-D ULTRAFINE III SHORT PEN 31G X 8 MM MISC USE AS DIRECTED TWICE DAILY 100 each 11  . bicalutamide (CASODEX) 50 MG tablet Take 50 mg by mouth daily.    . canagliflozin (INVOKANA) 100 MG TABS tablet Take 1 tablet (100 mg total) by mouth daily. 30 tablet 2  . carvedilol (COREG) 3.125 MG tablet Take 1 tablet (3.125 mg total) by mouth 2 (two) times daily with a meal. 60 tablet 3  . clopidogrel (PLAVIX) 75 MG tablet Take 1 tablet (75 mg total) by mouth daily. 30 tablet 11  . ezetimibe (ZETIA) 10 MG tablet Take 1 tablet (10 mg total) by mouth daily. 30 tablet 3  . furosemide (LASIX) 40 MG tablet Take 1 tablet (40 mg total) by mouth daily as needed for fluid or edema. 30 tablet 3  . glucose blood (ONE TOUCH ULTRA TEST) test strip USE AS DIRECTED TO TEST BLOOD SUGARS TWICE DAILY.  Diagnosis:  E11.59   Insulin-dependent. 200 each 3  . insulin aspart (NOVOLOG FLEXPEN) 100 UNIT/ML FlexPen Inject 32 Units into the skin 3 (three) times daily with meals. 30 mL 2  . Insulin Glargine (TOUJEO SOLOSTAR) 300 UNIT/ML SOPN Inject 110 Units into the skin at bedtime. [place]  pen 2  . leuprolide (LUPRON) 30 MG injection Inject 45 mg into the muscle every 6 (six) months.    Marland Kitchen losartan (COZAAR) 25 MG tablet Take 0.5 tablets (12.5 mg total) by mouth daily. 30 tablet 6  . metFORMIN (GLUCOPHAGE-XR) 500 MG 24 hr tablet Take 1 tablet (500 mg total) by mouth 2 (two) times daily with a meal. (Patient taking differently: Take 500 mg by mouth daily. ) 60 tablet 1  . NITROSTAT 0.4 MG SL tablet Place 0.4 mg  under the tongue as needed.   1  . potassium chloride SA (K-DUR,KLOR-CON) 20 MEQ tablet Take 1 tablet (20 mEq total) by mouth daily as needed (Take one tabley if you take a Lasix tablet). 30 tablet 3  . pravastatin (PRAVACHOL) 40 MG tablet Take 1 tablet (40 mg total) by mouth every evening. 30 tablet 3   No current facility-administered medications on file prior to visit.    Allergies  Allergen Reactions  . Metformin And Related Nausea Only  . Rosuvastatin Calcium     REACTION: JOINT ACHES  . Statins     REACTION: JOINT ACHES    Objective:  There were no vitals filed for this visit.  General: Well developed, nourished, in no acute distress, alert and oriented x3   Dermatology: Skin is warm, dry and supple bilateral. Left hallux nail reveals early ingrowing with thickening, discoloration and subungual debris, no other symptoms on any other nails at this time.. There are no open sores, lesions or other signs of infection present.  Vascular: Dorsalis Pedis artery and Posterior Tibial artery pedal pulses are 1/4 bilateral with immedate capillary fill time. Scant hair growth present. Trace lower extremity edema with venous hyperpigmentation and varicosities.   Neruologic: Grossly intact via light touch bilateral. Rotatory and protective sensation diminished bilateral  Musculoskeletal: Tenderness to palpation of the left hallux nail. Muscular strength within normal limits in all groups bilateral.   Assesement and Plan: Problem List Items Addressed This Visit    None    Visit Diagnoses    Nail dystrophy    -  Primary    Fungal culture suggest microtrauma    Ingrown toenail        secondary to dystrophy     Pain of toe, unspecified laterality        Diabetic polyneuropathy associated with type 2 diabetes mellitus ([place])           -Discussed treatment alternatives and plan of care; Explained permanent/temporary nail avulsion and post procedure course to patient. Patient opts for  temporary removal of left hallux nail at this time - After a verbal consent, injected 3 ml of a 50:50 mixture of 2% plain lidocaine and 0.5% plain marcaine in a normal hallux block fashion. Next, a betadine prep was performed. Anesthesia was tested and found to be appropriate.  The Left hallux nail was then incised from the hyponychium to the epinychium and cleared from the field. The area was then flushed with alcohol and dressed with antibiotic cream and a dry sterile dressing. -Patient was instructed to leave the dressing intact for today and begin soaking in a weak solution of betadine and water tomorrow. Patient was instructed to  soak for 15 minutes each day and apply neosporin and a gauze or bandaid dressing each day. -Patient was instructed to monitor the toe for signs of infection and return to office if toe becomes red, hot or swollen. -Advised ice, elevation, and tylenol or motrin if needed for  pain.  -Patient is to return in 1-2 weeks for follow up care or sooner if problems arise.  Landis Martins, DPM

## 2015-12-23 NOTE — Patient Instructions (Signed)

## 2016-01-03 ENCOUNTER — Encounter: Payer: Self-pay | Admitting: Sports Medicine

## 2016-01-03 ENCOUNTER — Ambulatory Visit (INDEPENDENT_AMBULATORY_CARE_PROVIDER_SITE_OTHER): Payer: BLUE CROSS/BLUE SHIELD | Admitting: Sports Medicine

## 2016-01-03 DIAGNOSIS — Z9889 Other specified postprocedural states: Secondary | ICD-10-CM

## 2016-01-03 DIAGNOSIS — E1142 Type 2 diabetes mellitus with diabetic polyneuropathy: Secondary | ICD-10-CM

## 2016-01-03 DIAGNOSIS — M79676 Pain in unspecified toe(s): Secondary | ICD-10-CM

## 2016-01-03 DIAGNOSIS — L600 Ingrowing nail: Secondary | ICD-10-CM

## 2016-01-03 NOTE — Progress Notes (Signed)
Patient ID: Phillip Clements, male   DOB: 1950/12/29, 65 y.o.   MRN: [id number] Subjective: Phillip Clements is a 65 y.o. Diabetic male patient returns to office today for follow up evaluation after having Left Hallux temporary nail avulsion performed on 12-23-15. Patient has been soaking using epsom salt and applying topical antibiotic covered with bandaid daily. Patient denies fever/chills/nausea/vomitting/any other related constitutional symptoms at this time.  FBS 234mg /dl  Patient Active Problem List   Diagnosis Date Noted  . Poorly controlled type 2 diabetes mellitus with circulatory disorder ([place]) 03/18/2015  . CVA (cerebral vascular accident) ([place]) 07/23/2014  . CVA (cerebral infarction)   . OSA on CPAP   . Hypertension   . Coronary artery disease involving coronary bypass graft of native heart with unstable angina pectoris ([place])   . Acute coronary syndrome ([place]) 07/19/2014  . Postoperative atrial fibrillation ([place]) 05/23/2014  . Coronary artery disease   . Hyperlipidemia   . Angina, class III ([place]) 04/13/2014  . Syncope 04/13/2014  . Exertional chest pain 03/29/2014  . SK (seborrheic keratosis) 12/30/2013  . Caregiver burden 12/30/2013  . 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 Vertigo 04/28/2013  . Genuine stress incontinence, male 05/20/2012  . Disorder of bladder function 05/13/2012  . 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 Incomplete bladder emptying 05/13/2012  . Malignant neoplasm of prostate ([place]) 05/13/2012  . Malignant neoplasm metastatic to bone ([place]) 05/13/2012  . Infection of urinary tract 05/13/2012  . Burning or prickling sensation 12/03/2011  . Elevated blood pressure reading without diagnosis of hypertension 07/30/2011  . Cerebrovascular accident (CVA) ([place]) 07/30/2011  . Arthritis, degenerative 07/30/2011  . Pure hypercholesterolemia 07/30/2011  . Presbyopia 07/30/2011  . Type 2 diabetes mellitus ([place]) 07/30/2011  . Peripheral vascular  disease ([place]) 10/03/2009  . DEGENERATIVE JOINT DISEASE 10/03/2009  . Artery disease, cerebral 07/18/2009  . Obesity 05/12/2009  . Benign hypertension 06/29/2008  . History of tobacco use 06/29/2008  . NICOTINE ADDICTION 09/16/2007  . Prostate cancer ([place]) 09/16/2007    Current Outpatient Prescriptions on File Prior to Visit  Medication Sig Dispense Refill  . Albiglutide 50 MG PEN Inject 50 mg into the skin once a week. 4 each 3  . aspirin EC 81 MG tablet Take 1 tablet (81 mg total) by mouth daily. 90 tablet 3  . B-D ULTRAFINE III SHORT PEN 31G X 8 MM MISC USE AS DIRECTED TWICE DAILY 100 each 11  . bicalutamide (CASODEX) 50 MG tablet Take 50 mg by mouth daily.    . canagliflozin (INVOKANA) 100 MG TABS tablet Take 1 tablet (100 mg total) by mouth daily. 30 tablet 2  . carvedilol (COREG) 3.125 MG tablet Take 1 tablet (3.125 mg total) by mouth 2 (two) times daily with a meal. 60 tablet 3  . clopidogrel (PLAVIX) 75 MG tablet Take 1 tablet (75 mg total) by mouth daily. 30 tablet 11  . ezetimibe (ZETIA) 10 MG tablet Take 1 tablet (10 mg total) by mouth daily. 30 tablet 3  . furosemide (LASIX) 40 MG tablet Take 1 tablet (40 mg total) by mouth daily as needed for fluid or edema. 30 tablet 3  . glucose blood (ONE TOUCH ULTRA TEST) test strip USE AS DIRECTED TO TEST BLOOD SUGARS TWICE DAILY.  Diagnosis:  E11.59   Insulin-dependent. 200 each 3  . insulin aspart (NOVOLOG FLEXPEN) 100 UNIT/ML FlexPen Inject 32 Units into the skin 3 (three) times daily with meals. 30 mL 2  . Insulin Glargine (  TOUJEO SOLOSTAR) 300 UNIT/ML SOPN Inject 110 Units into the skin at bedtime. 30 pen 2  . leuprolide (LUPRON) 30 MG injection Inject 45 mg into the muscle every 6 (six) months.    Marland Kitchen losartan (COZAAR) 25 MG tablet Take 0.5 tablets (12.5 mg total) by mouth daily. 30 tablet 6  . metFORMIN (GLUCOPHAGE-XR) 500 MG 24 hr tablet Take 1 tablet (500 mg total) by mouth 2 (two) times daily with a meal. (Patient taking  differently: Take 500 mg by mouth daily. ) 60 tablet 1  . NITROSTAT 0.4 MG SL tablet Place 0.4 mg under the tongue as needed.   1  . potassium chloride SA (K-DUR,KLOR-CON) 20 MEQ tablet Take 1 tablet (20 mEq total) by mouth daily as needed (Take one tabley if you take a Lasix tablet). 30 tablet 3  . pravastatin (PRAVACHOL) 40 MG tablet Take 1 tablet (40 mg total) by mouth every evening. 30 tablet 3   No current facility-administered medications on file prior to visit.    Allergies  Allergen Reactions  . Metformin And Related Nausea Only  . Rosuvastatin Calcium     REACTION: JOINT ACHES  . Statins     REACTION: JOINT ACHES    Objective:  General: Well developed, nourished, in no acute distress, alert and oriented x3   Dermatology: Skin is warm, dry and supple bilateral.Left hallux nail bed appears to be clean, dry, with mild granular tissue and surrounding eschar/scab. (-) Erythema. (-) Edema. (-) serosanguous drainage present. Right hallux onychodystrophy with ingrowing with no acute infection. The remaining nails appear unremarkable at this time. There are no other lesions or other signs of infection present.  Neurovascular status:Unchanged; No lower extremity swelling; No pain with calf compression bilateral.  Musculoskeletal: Decreased tenderness to palpation of the left hallux. Muscular strength within normal limits bilateral.   Assesement and Plan: Problem List Items Addressed This Visit    None    Visit Diagnoses    S/P nail surgery    -  Primary    Ingrown toenail        Pain of toe, unspecified laterality        Diabetic polyneuropathy associated with type 2 diabetes mellitus ([place])           -Examined patient  -Cleansed left hallux nail bed and gently scrubbed with peroxide and applied antibiotic cream covered with bandaid.  -Discussed plan of care with patient. -Patient to continue soaking in a weak solution of Epsom salt and warm water. Patient was instructed to  soak for 15-20 minutes each day until the toe appears normal and there is no drainage, redness, tenderness, or swelling at the procedure site, and apply neosporin and a gauze or bandaid dressing each day as needed. May leave open to air at night. -Educated patient on long term care after nail surgery. -Patient was instructed to monitor the toe for reoccurrence and signs of infection; Patient advised to return to office or go to ER if toe becomes red, hot or swollen. -Patient is to return in 7 weeks for possible right hallux nail removal or sooner if problems arise.  Landis Martins, DPM

## 2016-01-19 ENCOUNTER — Encounter: Payer: Self-pay | Admitting: Cardiovascular Disease

## 2016-01-19 ENCOUNTER — Ambulatory Visit (INDEPENDENT_AMBULATORY_CARE_PROVIDER_SITE_OTHER): Payer: BLUE CROSS/BLUE SHIELD | Admitting: Cardiovascular Disease

## 2016-01-19 VITALS — BP 120/80 | HR 60 | Ht 69.5 in | Wt 271.0 lb

## 2016-01-19 DIAGNOSIS — I10 Essential (primary) hypertension: Secondary | ICD-10-CM

## 2016-01-19 DIAGNOSIS — E785 Hyperlipidemia, unspecified: Secondary | ICD-10-CM

## 2016-01-19 MED ORDER — NITROSTAT 0.4 MG SL SUBL: 0.4000 mg | SUBLINGUAL_TABLET | SUBLINGUAL | Status: DC | PRN

## 2016-01-19 NOTE — Progress Notes (Signed)
Cardiology Office Note   Date:  01/19/2016   ID:  Phillip Clements, DOB 1950/10/24, MRN [id number]  PCP:  Elsie Stain, MD  Cardiologist:   [name], MD   Chief Complaint  Patient presents with  . Follow-up    6 months      History of Present Illness: Phillip Clements is a 65 y.o. male who presents for a followup visit regarding coronary artery disease .  He has known history of diabetes for at least 25 years of duration which has not been optimally controlled. He also has other medical conditions that include  Hyperlipidemia,PAD status post left SFA angioplasty in December 2016,  tobacco use, obesity and prostate cancer.  He had CABG in September 2015 for severe three-vessel coronary artery disease.  Ejection fraction was 45%.  He developed recurrent angina  in November, 2015. He underwent a nuclear stress test which was abnormal. He had inferolateral ST elevation with exercise. Nuclear imaging showed evidence of inferior as well as anterior ischemia. Repeat cardiac catheterization showed occluded grafts except for SVG to OM 2. LIMA to LAD was atretic. Ejection fraction was 55%. He underwent high-risk atherectomy of the left main and LAD with stenting of the mid and ostial LAD . He has been doing reasonably well from a cardiac standpoint. He reports stable symptoms of mild chest tightness with extreme activities. He walks 3 or 4 times every week for exercise. He underwent left SFA angioplasty by Dr. Delana Meyer in December 2016. He reports minimal improvement in claudication. During last visit, I started him on pravastatin and Zetia. Unfortunately, he started having myalgia and joint pain again. He has not been able to tolerate any statins in the past.  Past Medical History  Diagnosis Date  . Hypertension 2000  . Coronary artery disease     a. CABG 04/2014: (LIMA--> LAD, SVG --> DIAG, SVG--> OM1, SVG--> PDA)  b. early graft failure. s/p successful rotational atherectomy and stenting  of the left main and proximal LAD with DES. DES of mid LAD.(07/22/14)  . Hyperlipidemia   . OSA on CPAP   . Type II diabetes mellitus ([place])   . CVA (cerebral infarction)   . Peripheral vascular disease ([place])   . Stroke ([place])   . Prostate cancer ([place])   . Prostate cancer ([place])   . Prostate cancer ([place])   . Prostate cancer [facility])     Past Surgical History  Procedure Laterality Date  . Prostate biopsy  04/03/06 & 02/25/07  . High intense focused ultrasound  07/20/06    By Dr. Jacqlyn Larsen  . Cytoscopy prostatic stone o/w nml  06/21/08    Dr. Jacqlyn Larsen  . Ett myoview  09/13/09    Low risk EF 49%  . Anterior cervical decomp/discectomy fusion  08/25/2012    Procedure: ANTERIOR CERVICAL DECOMPRESSION/DISCECTOMY FUSION 2 LEVELS;  Surgeon: Hosie Spangle, MD;  Location: [facility] NEURO ORS;  Service: Neurosurgery;  Laterality: N/A;  Cervical five-six Cervical six-seven anterior cervical decompression with fusion and plating and bonegraft  . Intraoperative transesophageal echocardiogram N/A 04/26/2014    Procedure: INTRAOPERATIVE TRANSESOPHAGEAL ECHOCARDIOGRAM;  Surgeon: Ivin Poot, MD;  Location: [facility];  Service: Open Heart Surgery;  Laterality: N/A;  . Coronary artery bypass graft N/A 04/26/2014    Procedure: CORONARY ARTERY BYPASS GRAFTING (CABG), on pump, times four, using left internal mammary artery, right greater saphenous vein harvested endoscopically.;  Surgeon: Ivin Poot, MD;  Location: [facility];  Service: Open Heart Surgery;  Laterality: N/A;  LIMA to LAD, SVG to DIAGONAL, SVG to OM1, SVG to PDA) with EVH of the RIGHT THIGH and LOWER EXTREMITY SAPHENOUS VEIN  . Back surgery    . Percutaneous coronary rotoblator intervention (pci-r) N/A 07/22/2014    Procedure: PERCUTANEOUS CORONARY ROTOBLATOR INTERVENTION (PCI-R);  Surgeon: Peter M Martinique, MD;  Location[facility] CATH LAB;  Service: Cardiovascular;  Laterality: N/A;  . Cardiac catheterization  04/2014; 07/19/2014  . Peripheral vascular catheterization Left  07/26/2015    Procedure: Lower Extremity Angiography;  Surgeon: Katha Cabal, MD;  Location: [facility] CV LAB;  Service: Cardiovascular;  Laterality: Left;  . Peripheral vascular catheterization  07/26/2015    Procedure: Lower Extremity Intervention;  Surgeon: Katha Cabal, MD;  Location: [facility] CV LAB;  Service: Cardiovascular;;     Current Outpatient Prescriptions  Medication Sig Dispense Refill  . Albiglutide 50 MG PEN Inject 50 mg into the skin once a week. 4 each 3  . aspirin EC 81 MG tablet Take 1 tablet (81 mg total) by mouth daily. 90 tablet 3  . B-D ULTRAFINE III SHORT PEN 31G X 8 MM MISC USE AS DIRECTED TWICE DAILY 100 each 11  . bicalutamide (CASODEX) 50 MG tablet Take 50 mg by mouth daily.    . canagliflozin (INVOKANA) 100 MG TABS tablet Take 1 tablet (100 mg total) by mouth daily. 30 tablet 2  . carvedilol (COREG) 3.125 MG tablet Take 1 tablet (3.125 mg total) by mouth 2 (two) times daily with a meal. 60 tablet 3  . clopidogrel (PLAVIX) 75 MG tablet Take 1 tablet (75 mg total) by mouth daily. 30 tablet 11  . ezetimibe (ZETIA) 10 MG tablet Take 1 tablet (10 mg total) by mouth daily. 30 tablet 3  . furosemide (LASIX) 40 MG tablet Take 1 tablet (40 mg total) by mouth daily as needed for fluid or edema. 30 tablet 3  . glucose blood (ONE TOUCH ULTRA TEST) test strip USE AS DIRECTED TO TEST BLOOD SUGARS TWICE DAILY.  Diagnosis:  E11.59   Insulin-dependent. 200 each 3  . insulin aspart (NOVOLOG FLEXPEN) 100 UNIT/ML FlexPen Inject 32 Units into the skin 3 (three) times daily with meals. 30 mL 2  . Insulin Glargine (TOUJEO SOLOSTAR) 300 UNIT/ML SOPN Inject 110 Units into the skin at bedtime. 30 pen 2  . leuprolide (LUPRON) 30 MG injection Inject 45 mg into the muscle every 6 (six) months.    Marland Kitchen losartan (COZAAR) 25 MG tablet Take 0.5 tablets (12.5 mg total) by mouth daily. 30 tablet 6  . NITROSTAT 0.4 MG SL tablet Place 0.4 mg under the tongue as needed.   1  .  potassium chloride SA (K-DUR,KLOR-CON) 20 MEQ tablet Take 1 tablet (20 mEq total) by mouth daily as needed (Take one tabley if you take a Lasix tablet). 30 tablet 3  . pravastatin (PRAVACHOL) 40 MG tablet Take 1 tablet (40 mg total) by mouth every evening. 30 tablet 3   No current facility-administered medications for this visit.    Allergies:   Metformin and related; Rosuvastatin calcium; and Statins    Social History:  The patient  reports that he quit smoking about 22 months ago. His smoking use included Cigarettes. He has a 36 pack-year smoking history. He has never used smokeless tobacco. He reports that he drinks alcohol. He reports that he does not use illicit drugs.   Family History:  The patient's family history includes Coronary artery disease in his father and  mother; Diabetes in his brother, father, and sister; Diabetes (age of onset: 60) in his mother; Heart attack in his brother, father, maternal grandfather, and mother; Heart disease in his brother, father, and mother; Hypertension in his father and mother; Prostate cancer in his paternal grandfather. There is no history of Breast cancer, Colon cancer, Depression, Alcohol abuse, or Stroke.    ROS:  Please see the history of present illness.   Otherwise, review of systems are positive for none.   All other systems are reviewed and negative.    PHYSICAL EXAM: VS:  BP 120/80 mmHg  Pulse 60  Ht 5' 9.5" (1.765 m)  Wt 271 lb (122.925 kg)  BMI 39.46 kg/m2 , BMI Body mass index is 39.46 kg/(m^2). GEN: Well nourished, well developed, in no acute distress HEENT: normal Neck: no JVD, carotid bruits, or masses Cardiac: RRR; no murmurs, rubs, or gallops,no edema  Respiratory:  clear to auscultation bilaterally, normal work of breathing GI: soft, nontender, nondistended, + BS MS: no deformity or atrophy Skin: warm and dry, no rash Neuro:  Strength and sensation are intact Psych: euthymic mood, full affect   EKG:  EKG is ordered  today. The ekg ordered today demonstrates  normal sinus rhythm with no significant ST or T wave changes.   Recent Labs: 05/06/2015: Potassium 4.1; Sodium 138 07/26/2015: BUN 20; Creatinine, Ser 0.89 09/13/2015: ALT 21    Lipid Panel    Component Value Date/Time   CHOL 150 09/13/2015 0850   CHOL 170 07/20/2014 0241   TRIG 126 09/13/2015 0850   HDL 35* 09/13/2015 0850   HDL 32* 07/20/2014 0241   CHOLHDL 4.3 09/13/2015 0850   CHOLHDL 5.3 07/20/2014 0241   VLDL 22 07/20/2014 0241   LDLCALC 90 09/13/2015 0850   LDLCALC 116* 07/20/2014 0241   LDLDIRECT 157.1 09/07/2013 0910      Wt Readings from Last 3 Encounters:  01/19/16 271 lb (122.925 kg)  11/16/15 275 lb (124.739 kg)  11/03/15 275 lb (124.739 kg)        ASSESSMENT AND PLAN:  1.  Coronary artery disease involving graft disease with mild angina: His symptoms have been stable overall and he has been active with increase physical activities. I recommend continuing medical therapy.  2. Peripheral arterial disease: He has mild claudication overall.  3. Hyperlipidemia: Unfortunately, he does not appear to be tolerating pravastatin. His lipid profile did improve significantly with the combination of pravastatin and Zetia but due to intolerance, pravastatin was stopped today. He has been tried on all statins in the past with an inability to tolerate due to musculoskeletal symptoms. I'm going to repeat his labs in 2 months. If LDL is above 100, I recommend treatment with a PCSK 9 inhibitor  4. Essential hypertension: Blood pressure is well controlled on current medications.    Disposition:   FU with me in 6 months  Signed,  [name], MD  01/19/2016 11:36 AM    [place]

## 2016-01-19 NOTE — Patient Instructions (Signed)
Medication Instructions:  Your physician has recommended you make the following change in your medication:  STOP taking pravastatin   Labwork: Fasting lipid and liver profile in 2 months. Nothing to eat or drink after midnight the evening before your labs  Testing/Procedures: none  Follow-Up: Your physician wants you to follow-up in: six months with Dr. Fletcher Anon.  You will receive a reminder letter in the mail two months in advance. If you don't receive a letter, please call our office to schedule the follow-up appointment.   Any Other Special Instructions Will Be Listed Below (If Applicable).     If you need a refill on your cardiac medications before your next appointment, please call your pharmacy.

## 2016-01-20 ENCOUNTER — Encounter: Payer: Self-pay | Admitting: Cardiovascular Disease

## 2016-01-24 ENCOUNTER — Other Ambulatory Visit: Payer: Self-pay

## 2016-01-24 MED ORDER — INSULIN GLARGINE 300 UNIT/ML ~~LOC~~ SOPN: 110.0000 [IU] | PEN_INJECTOR | Freq: Every day | SUBCUTANEOUS | Status: DC

## 2016-02-10 ENCOUNTER — Encounter: Payer: Self-pay | Admitting: Sports Medicine

## 2016-02-10 ENCOUNTER — Ambulatory Visit (INDEPENDENT_AMBULATORY_CARE_PROVIDER_SITE_OTHER): Payer: BLUE CROSS/BLUE SHIELD | Admitting: Sports Medicine

## 2016-02-10 DIAGNOSIS — E1142 Type 2 diabetes mellitus with diabetic polyneuropathy: Secondary | ICD-10-CM

## 2016-02-10 DIAGNOSIS — L603 Nail dystrophy: Secondary | ICD-10-CM | POA: Diagnosis not present

## 2016-02-10 DIAGNOSIS — M79676 Pain in unspecified toe(s): Secondary | ICD-10-CM | POA: Diagnosis not present

## 2016-02-10 DIAGNOSIS — L600 Ingrowing nail: Secondary | ICD-10-CM

## 2016-02-10 NOTE — Progress Notes (Signed)
Patient ID: Phillip Clements, male   DOB: 1950/11/04, 65 y.o.   MRN: [id number] Subjective: Phillip Clements is a 65 y.o. Diabetic male patient presents to office today complaining of a painful incurvated and thickened right hallux nail; patient is here for removal as planned. Patient denies fever/chills/nausea/vomitting/any other related constitutional symptoms at this time.  FBS: So-So per patient   Patient Active Problem List   Diagnosis Date Noted  . Poorly controlled type 2 diabetes mellitus with circulatory disorder ([place]) 03/18/2015  . CVA (cerebral vascular accident) ([place]) 07/23/2014  . CVA (cerebral infarction)   . OSA on CPAP   . Hypertension   . Coronary artery disease involving coronary bypass graft of native heart with unstable angina pectoris ([place])   . Acute coronary syndrome ([place]) 07/19/2014  . Postoperative atrial fibrillation ([place]) 05/23/2014  . Coronary artery disease   . Hyperlipidemia   . Angina, class III ([place]) 04/13/2014  . Syncope 04/13/2014  . Exertional chest pain 03/29/2014  . SK (seborrheic keratosis) 12/30/2013  . Caregiver burden 12/30/2013  . 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 Vertigo 04/28/2013  . Genuine stress incontinence, male 05/20/2012  . Disorder of bladder function 05/13/2012  . 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 Incomplete bladder emptying 05/13/2012  . Malignant neoplasm of prostate ([place]) 05/13/2012  . Malignant neoplasm metastatic to bone ([place]) 05/13/2012  . Infection of urinary tract 05/13/2012  . Burning or prickling sensation 12/03/2011  . Elevated blood pressure reading without diagnosis of hypertension 07/30/2011  . Cerebrovascular accident (CVA) ([place]) 07/30/2011  . Arthritis, degenerative 07/30/2011  . Pure hypercholesterolemia 07/30/2011  . Presbyopia 07/30/2011  . Type 2 diabetes mellitus ([place]) 07/30/2011  . Peripheral vascular disease ([place]) 10/03/2009  . DEGENERATIVE JOINT DISEASE 10/03/2009  .  Artery disease, cerebral 07/18/2009  . Obesity 05/12/2009  . Benign hypertension 06/29/2008  . History of tobacco use 06/29/2008  . NICOTINE ADDICTION 09/16/2007  . Prostate cancer ([place]) 09/16/2007    Current Outpatient Prescriptions on File Prior to Visit  Medication Sig Dispense Refill  . Albiglutide 50 MG PEN Inject 50 mg into the skin once a week. 4 each 3  . aspirin EC 81 MG tablet Take 1 tablet (81 mg total) by mouth daily. 90 tablet 3  . B-D ULTRAFINE III SHORT PEN 31G X 8 MM MISC USE AS DIRECTED TWICE DAILY 100 each 11  . bicalutamide (CASODEX) 50 MG tablet Take 50 mg by mouth daily.    . canagliflozin (INVOKANA) 100 MG TABS tablet Take 1 tablet (100 mg total) by mouth daily. 30 tablet 2  . carvedilol (COREG) 3.125 MG tablet Take 1 tablet (3.125 mg total) by mouth 2 (two) times daily with a meal. 60 tablet 3  . clopidogrel (PLAVIX) 75 MG tablet Take 1 tablet (75 mg total) by mouth daily. 30 tablet 11  . ezetimibe (ZETIA) 10 MG tablet Take 1 tablet (10 mg total) by mouth daily. 30 tablet 3  . furosemide (LASIX) 40 MG tablet Take 1 tablet (40 mg total) by mouth daily as needed for fluid or edema. 30 tablet 3  . glucose blood (ONE TOUCH ULTRA TEST) test strip USE AS DIRECTED TO TEST BLOOD SUGARS TWICE DAILY.  Diagnosis:  E11.59   Insulin-dependent. 200 each 3  . insulin aspart (NOVOLOG FLEXPEN) 100 UNIT/ML FlexPen Inject 32 Units into the skin 3 (three) times daily with meals. 30 mL 2  . Insulin Glargine (TOUJEO SOLOSTAR) 300 UNIT/ML SOPN Inject 110 Units into  the skin at bedtime. 30 pen 2  . leuprolide (LUPRON) 30 MG injection Inject 45 mg into the muscle every 6 (six) months.    Marland Kitchen losartan (COZAAR) 25 MG tablet Take 0.5 tablets (12.5 mg total) by mouth daily. 30 tablet 6  . NITROSTAT 0.4 MG SL tablet Place 1 tablet (0.4 mg total) under the tongue as needed. 25 tablet 3  . potassium chloride SA (K-DUR,KLOR-CON) 20 MEQ tablet Take 1 tablet (20 mEq total) by mouth daily as needed (Take  one tabley if you take a Lasix tablet). 30 tablet 3   No current facility-administered medications on file prior to visit.    Allergies  Allergen Reactions  . Metformin And Related Nausea Only  . Rosuvastatin Calcium     REACTION: JOINT ACHES  . Statins     REACTION: JOINT ACHES    Objective:  There were no vitals filed for this visit.  General: Well developed, nourished, in no acute distress, alert and oriented x3   Dermatology: Skin is warm, dry and supple bilateral. Right hallux nail appears to be severely incurvated with hyperkeratosis formation at the distal aspects of the medial and lateral nail border in a pincher fashion. (-) Erythema. (-) Edema. (-) serosanguous drainage present. Left hallux nail bed appears to be clean and dry well healed. The remaining nails appear unremarkable at this time. There are no open sores, lesions or other signs of infection  present.  Neurovascular status unchanged  Musculoskeletal: Tenderness to palpation of the Right hallux nail. Muscular strength within normal limits in all groups bilateral.   Assesement and Plan: Problem List Items Addressed This Visit    None    Visit Diagnoses    Ingrown toenail    -  Primary    Nail dystrophy        Pain of toe, unspecified laterality        Diabetic polyneuropathy associated with type 2 diabetes mellitus ([place])           -Discussed treatment alternatives and plan of care; Explained permanent/temporary nail avulsion and post procedure course to patient. Patient opt for complete removal of right hallux nail; does not want phenol or permanent procedure with the understanding that nail may grow back the same - After a verbal consent, injected 3 ml of a 50:50 mixture of 2% plain  lidocaine and 0.5% plain marcaine in a normal hallux block fashion. Next, a  betadine prep was performed. Anesthesia was tested and found to be appropriate.  The right hallux nail was then incised from the hyponychium to the  epinychium. The nail was removed and cleared from the field. The area was curretted for any remaining nail or spicules and flushed with alcohol and dressed with antibiotic cream and a dry sterile dressing. -Patient was instructed to leave the dressing intact for today and begin soaking  in a weak solution of betadine and water tomorrow. Patient was instructed to  soak for 15 minutes each day and apply neosporin and a gauze or bandaid dressing each day. -Patient was instructed to monitor the toe for signs of infection and return to office if toe becomes red, hot or swollen. -Advised ice, elevation, and tylenol or motrin if needed for pain.  -Patient is to return in 1 week for follow up care or sooner if problems arise.  Landis Martins, DPM

## 2016-02-16 ENCOUNTER — Encounter: Payer: Self-pay | Admitting: Internal Medicine

## 2016-02-16 ENCOUNTER — Ambulatory Visit (INDEPENDENT_AMBULATORY_CARE_PROVIDER_SITE_OTHER): Payer: BLUE CROSS/BLUE SHIELD | Admitting: Internal Medicine

## 2016-02-16 VITALS — BP 130/68 | HR 66 | Ht 69.0 in | Wt 270.0 lb

## 2016-02-16 DIAGNOSIS — E1159 Type 2 diabetes mellitus with other circulatory complications: Secondary | ICD-10-CM | POA: Diagnosis not present

## 2016-02-16 DIAGNOSIS — E1165 Type 2 diabetes mellitus with hyperglycemia: Secondary | ICD-10-CM | POA: Diagnosis not present

## 2016-02-16 LAB — POCT GLYCOSYLATED HEMOGLOBIN (HGB A1C): Hemoglobin A1C: 7.3

## 2016-02-16 MED ORDER — CANAGLIFLOZIN 300 MG PO TABS: 300.0000 mg | ORAL_TABLET | Freq: Every day | ORAL | Status: DC

## 2016-02-16 MED ORDER — INSULIN ASPART 100 UNIT/ML FLEXPEN: 40.0000 [IU] | PEN_INJECTOR | Freq: Three times a day (TID) | SUBCUTANEOUS | Status: DC

## 2016-02-16 NOTE — Patient Instructions (Addendum)
Please continue: - Tanzeum 50 mg weekly - Toujeo 110 (2x 55 units) at bedtime - Novolog 40 units 3x a day - move this 15 min before meals  Increase: - Invokana to 300 mg daily  Restart Metformin ER 500 mg with dinner.  Think about U500 insulin and you may also check with your insurance Re: price.  Please come back for a follow-up appointment in 3 months.

## 2016-02-16 NOTE — Progress Notes (Signed)
Patient ID: Phillip Clements, male   DOB: 1951-04-08, 65 y.o.   MRN: [id number]  HPI: Phillip Clements is a 65 y.o.-year-old male, returning for f/u for DM2, dx in 1996, insulin-dependent since 2008, uncontrolled, with complications (CAD - s/p stents, CABG, cerebro-vascular ds - s/p CVA). Last visit 3 mo ago.  Last hemoglobin A1c was: Lab Results  Component Value Date   HGBA1C 7.2 11/03/2015   HGBA1C 7.8 08/05/2015   HGBA1C 7.1* 05/06/2015   Pt is on a regimen of: - Invokana 100 mg daily - added 03/2015 - Tanzeum 50 mg weekly - Toujeo 110 (2x 55 units) at bedtime - Novolog 40 units 3x a day, before or after meals - + SSI Stopped Metformin ER 500 mg 1x a day with dinner. Higher doses >> diarrhea. He tried Victoza and Januvia Had GI intolerance (nausea) to Metformin.  Pt checks his sugars 1-3x a day and they are: - am: 173-250 >> 60-120 >> 158-198, 207 >> 130-160, 226 >> after workout: 93-197, 209, 247 (higher when forgets insulin) - 2h after b'fast: n/c - before lunch: 77, 82, 113-221, 340 >> 94-130 >> 82, 126-208 >> 73, 130-140, 180 >> 80-90 after golf, 122-230 - 2h after lunch: n/c - before dinner: 159, 167 >> 94-130 >> 136 >> 120s >> 101-219 - 2h after dinner: n/c - bedtime: 198, 254 >> 120 >> 307 >> n/c - nighttime: n/c No lows. Lowest sugar was 77 >> 60s >> low while playing Golf; he has hypoglycemia awareness at 70s.  Highest sugar was 340 >> 220x1 >> 307 >> 200s.  Glucometer: OneTouch Ultra mini  Pt's meals are: - Breakfast: thin bagel + cream cheese + special K cinnamon cereal - Lunch: sandwich, chips, cottage cheese, pineapple - Dinner: meat + 2 veggies, salads - Snacks: 2/day: celery + PB; low salt triscuit + laughing cow cheese  Exercise: cardio + weights daily; golf on wed. Sugars great after golf.  - no CKD, last BUN/creatinine:  Lab Results  Component Value Date   BUN 20 07/26/2015   CREATININE 0.89 07/26/2015  On Losartan - last set of lipids: Lab Results   Component Value Date   CHOL 150 09/13/2015   HDL 35* 09/13/2015   LDLCALC 90 09/13/2015   LDLDIRECT 157.1 09/07/2013   TRIG 126 09/13/2015   CHOLHDL 4.3 09/13/2015  Statins >> joint pain.  - last eye exam was in 05/30/2015. [facility]. No DR. Had cataract sx. - no numbness and tingling in his feet.  ROS: Constitutional: no weight gain/loss, no fatigue, no subjective hyperthermia/hypothermia, + nocturia Eyes: no blurry vision, no xerophthalmia ENT: no sore throat, no nodules palpated in throat, no dysphagia/odynophagia, no hoarseness Cardiovascular: no CP/SOB/palpitations/leg swelling Respiratory: no cough/SOB Gastrointestinal: no N/V/D/C Musculoskeletal: no muscle/joint aches Skin: no rashes Neurological: no tremors/numbness/tingling/dizziness  I reviewed pt's medications, allergies, PMH, social hx, family hx, and changes were documented in the history of present illness. Otherwise, unchanged from my initial visit note:  Past Medical History  Diagnosis Date  . Hypertension 2000  . Coronary artery disease     a. CABG 04/2014: (LIMA--> LAD, SVG --> DIAG, SVG--> OM1, SVG--> PDA)  b. early graft failure. s/p successful rotational atherectomy and stenting of the left main and proximal LAD with DES. DES of mid LAD.(07/22/14)  . Hyperlipidemia   . OSA on CPAP   . Type II diabetes mellitus ([place])   . CVA (cerebral infarction)   . Peripheral vascular disease ([place])  left SFA angioplasty in December 2016  . Stroke ([place])   . Prostate cancer ([place])   . Prostate cancer ([place])   . Prostate cancer ([place])   . Prostate cancer [facility])    Past Surgical History  Procedure Laterality Date  . Prostate biopsy  04/03/06 & 02/25/07  . High intense focused ultrasound  07/20/06    By Dr. Jacqlyn Larsen  . Cytoscopy prostatic stone o/w nml  06/21/08    Dr. Jacqlyn Larsen  . Ett myoview  09/13/09    Low risk EF 49%  . Anterior cervical decomp/discectomy fusion  08/25/2012    Procedure: ANTERIOR CERVICAL  DECOMPRESSION/DISCECTOMY FUSION 2 LEVELS;  Surgeon: Hosie Spangle, MD;  Location: [place] NEURO ORS;  Service: Neurosurgery;  Laterality: N/A;  Cervical five-six Cervical six-seven anterior cervical decompression with fusion and plating and bonegraft  . Intraoperative transesophageal echocardiogram N/A 04/26/2014    Procedure: INTRAOPERATIVE TRANSESOPHAGEAL ECHOCARDIOGRAM;  Surgeon: Ivin Poot, MD;  Location: [place];  Service: Open Heart Surgery;  Laterality: N/A;  . Coronary artery bypass graft N/A 04/26/2014    Procedure: CORONARY ARTERY BYPASS GRAFTING (CABG), on pump, times four, using left internal mammary artery, right greater saphenous vein harvested endoscopically.;  Surgeon: Ivin Poot, MD;  Location: [place];  Service: Open Heart Surgery;  Laterality: N/A;  LIMA to LAD, SVG to DIAGONAL, SVG to OM1, SVG to PDA) with EVH of the RIGHT THIGH and LOWER EXTREMITY SAPHENOUS VEIN  . Back surgery    . Percutaneous coronary rotoblator intervention (pci-r) N/A 07/22/2014    Procedure: PERCUTANEOUS CORONARY ROTOBLATOR INTERVENTION (PCI-R);  Surgeon: Peter M Martinique, MD;  Location: [facility] CATH LAB;  Service: Cardiovascular;  Laterality: N/A;  . Cardiac catheterization  04/2014; 07/19/2014  . Peripheral vascular catheterization Left 07/26/2015    Procedure: Lower Extremity Angiography;  Surgeon: Katha Cabal, MD;  Location: [place] CV LAB;  Service: Cardiovascular;  Laterality: Left;  . Peripheral vascular catheterization  07/26/2015    Procedure: Lower Extremity Intervention;  Surgeon: Katha Cabal, MD;  Location: [place] CV LAB;  Service: Cardiovascular;;   Social History   Social History  . Marital Status: Married    Spouse Name: N/A  . Number of Children: 3  . Years of Education: N/A   Occupational History  . Insurance account manager    Social History Main Topics  . Smoking status: Former Smoker -- 1.00 packs/day for 36 years    Types: Cigarettes     Quit date: 03/01/2014  . Smokeless tobacco: Never Used  . Alcohol Use: Yes     Comment: 07/19/2014 "might have a drink a couple times/yr"  . Drug Use: No  . Sexual Activity: Not Currently   Other Topics Concern  . Not on file   Social History Narrative   Lives with wife.   2 daughters and 1 son.   [employer] fan   Runs [employer]   Current Outpatient Prescriptions on File Prior to Visit  Medication Sig Dispense Refill  . Albiglutide 50 MG PEN Inject 50 mg into the skin once a week. 4 each 3  . aspirin EC 81 MG tablet Take 1 tablet (81 mg total) by mouth daily. 90 tablet 3  . bicalutamide (CASODEX) 50 MG tablet Take 50 mg by mouth daily.    . canagliflozin (INVOKANA) 100 MG TABS tablet Take 1 tablet (100 mg total) by mouth daily. 30 tablet 2  . carvedilol (COREG) 3.125 MG tablet Take 1 tablet (  3.125 mg total) by mouth 2 (two) times daily with a meal. 60 tablet 3  . clopidogrel (PLAVIX) 75 MG tablet Take 1 tablet (75 mg total) by mouth daily. 30 tablet 11  . ezetimibe (ZETIA) 10 MG tablet Take 1 tablet (10 mg total) by mouth daily. 30 tablet 3  . furosemide (LASIX) 40 MG tablet Take 1 tablet (40 mg total) by mouth daily as needed for fluid or edema. 30 tablet 3  . glucose blood (ONE TOUCH ULTRA TEST) test strip USE AS DIRECTED TO TEST BLOOD SUGARS TWICE DAILY.  Diagnosis:  E11.59   Insulin-dependent. 200 each 3  . insulin aspart (NOVOLOG FLEXPEN) 100 UNIT/ML FlexPen Inject 32 Units into the skin 3 (three) times daily with meals. 30 mL 2  . Insulin Glargine (TOUJEO SOLOSTAR) 300 UNIT/ML SOPN Inject 110 Units into the skin at bedtime. 30 pen 2  . leuprolide (LUPRON) 30 MG injection Inject 45 mg into the muscle every 6 (six) months.    Marland Kitchen losartan (COZAAR) 25 MG tablet Take 0.5 tablets (12.5 mg total) by mouth daily. 30 tablet 6  . NITROSTAT 0.4 MG SL tablet Place 1 tablet (0.4 mg total) under the tongue as needed. 25 tablet 3  . potassium chloride SA (K-DUR,KLOR-CON) 20 MEQ tablet Take  1 tablet (20 mEq total) by mouth daily as needed (Take one tabley if you take a Lasix tablet). 30 tablet 3  . B-D ULTRAFINE III SHORT PEN 31G X 8 MM MISC USE AS DIRECTED TWICE DAILY 100 each 11   No current facility-administered medications on file prior to visit.   Allergies  Allergen Reactions  . Metformin And Related Nausea Only  . Rosuvastatin Calcium     REACTION: JOINT ACHES  . Statins     REACTION: JOINT ACHES   Family History  Problem Relation Age of Onset  . Diabetes Mother 17    DM  . Coronary artery disease Mother   . Hypertension Mother   . Heart attack Mother     CABG with MI in 2005  . Heart disease Mother     CV  . Hypertension Father   . Heart attack Father     CABG with Mi around 1997  . Coronary artery disease Father   . Heart disease Father     CV  . Diabetes Father   . Heart attack Brother     MI/PTCA  . Heart disease Brother     CV  . Diabetes Brother   . Heart attack Maternal Grandfather     MI  . Prostate cancer Paternal Grandfather   . Breast cancer Neg Hx     Breast/ovarian/uterine cancer  . Colon cancer Neg Hx   . Depression Neg Hx   . Alcohol abuse Neg Hx     ETOH/drug abuse  . Stroke Neg Hx   . Diabetes Sister     DM   PE: BP 130/68 mmHg  Pulse 66  Ht 5\' 9"  (1.753 m)  Wt 270 lb (122.471 kg)  BMI 39.85 kg/m2  SpO2 97% Body mass index is 39.85 kg/(m^2). Wt Readings from Last 3 Encounters:  02/16/16 270 lb (122.471 kg)  01/19/16 271 lb (122.925 kg)  11/16/15 275 lb (124.739 kg)   Constitutional: obese, in NAD Eyes: PERRLA, EOMI, no exophthalmos ENT: moist mucous membranes, no thyromegaly, no cervical lymphadenopathy Cardiovascular: RRR, No MRG Respiratory: CTA B Gastrointestinal: abdomen protuberant, soft, NT, ND, BS+ Musculoskeletal: no deformities, strength intact in all 4 Skin:  moist, warm, no rashes Neurological: no tremor with outstretched hands, DTR normal in all 4  ASSESSMENT: 1. DM2, insulin-dependent,  uncontrolled, with complications - CAD - s/p stents, CABG - cerebro-vascular ds - s/p CVA   PLAN:  1. Patient with long-standing, uncontrolled diabetes, with variable control on basal - bolus insulin regimen + GLP1 R agonist and SGLT2 inhibitor. - will restart Metformin er since he stopped since last visit (?) - we again discussed about increasing Invokana >> he agrees. - we also discussed about U500 insulin >> reluctant to try this for now >> given more info about it - we will also move Novolog before meals as he now may take it after meals. He can forget it >> advised about alarms. I believe this is the source of sugars variability... - I suggested to:  Patient Instructions  Please continue: - Tanzeum 50 mg weekly - Toujeo 110 (2x 55 units) at bedtime - Novolog 40 units 3x a day - move this 15 min before meals  Increase: - Invokana to 300 mg daily  Restart Metformin ER 500 mg with dinner.  Think about U500 insulin and you may also check with your insurance Re: price.  Please come back for a follow-up appointment in 3 months.  - continue checking sugars at different times of the day - check 3 times a day, rotating checks - advised for yearly eye exams >> he is UTD - will check HbA1c today >> 7.3% (same) - Return to clinic in 3 mo with sugar log

## 2016-02-16 NOTE — Addendum Note (Signed)
Addended by: Caprice Beaver T on: 02/16/2016 11:11 AM   Modules accepted: Orders

## 2016-02-17 ENCOUNTER — Ambulatory Visit: Payer: BLUE CROSS/BLUE SHIELD | Admitting: Sports Medicine

## 2016-02-24 ENCOUNTER — Ambulatory Visit (INDEPENDENT_AMBULATORY_CARE_PROVIDER_SITE_OTHER): Payer: BLUE CROSS/BLUE SHIELD | Admitting: Sports Medicine

## 2016-02-24 ENCOUNTER — Encounter: Payer: Self-pay | Admitting: Sports Medicine

## 2016-02-24 DIAGNOSIS — Z9889 Other specified postprocedural states: Secondary | ICD-10-CM

## 2016-02-24 DIAGNOSIS — E1142 Type 2 diabetes mellitus with diabetic polyneuropathy: Secondary | ICD-10-CM

## 2016-02-24 DIAGNOSIS — M79676 Pain in unspecified toe(s): Secondary | ICD-10-CM

## 2016-02-24 NOTE — Progress Notes (Signed)
Patient ID: Phillip Clements, male   DOB: 12-14-1950, 65 y.o.   MRN: [id number] Subjective: Phillip Clements is a 65 y.o. Diabetic male patient returns to office today for follow up evaluation after having Right Hallux total temporary nail avulsion performed on 02-10-16. Patient has been soaking using betadine and applying topical antibiotic covered with bandaid daily. Patient denies fever/chills/nausea/vomitting/any other related constitutional symptoms at this time.  FBS 190  Patient Active Problem List   Diagnosis Date Noted  . Poorly controlled type 2 diabetes mellitus with circulatory disorder ([place]) 03/18/2015  . CVA (cerebral vascular accident) ([place]) 07/23/2014  . CVA (cerebral infarction)   . OSA on CPAP   . Hypertension   . Coronary artery disease involving coronary bypass graft of native heart with unstable angina pectoris ([place])   . Acute coronary syndrome ([place]) 07/19/2014  . Postoperative atrial fibrillation ([place]) 05/23/2014  . Coronary artery disease   . Hyperlipidemia   . Angina, class III ([place]) 04/13/2014  . Syncope 04/13/2014  . Exertional chest pain 03/29/2014  . SK (seborrheic keratosis) 12/30/2013  . Caregiver burden 12/30/2013  . 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 Vertigo 04/28/2013  . Genuine stress incontinence, male 05/20/2012  . Disorder of bladder function 05/13/2012  . 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 Incomplete bladder emptying 05/13/2012  . Malignant neoplasm of prostate ([place]) 05/13/2012  . 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 Infection of urinary tract 05/13/2012  . Burning or prickling sensation 12/03/2011  . Elevated blood pressure reading without diagnosis of hypertension 07/30/2011  . Cerebrovascular accident (CVA) ([place]) 07/30/2011  . Arthritis, degenerative 07/30/2011  . Pure hypercholesterolemia 07/30/2011  . Presbyopia 07/30/2011  . Type 2 diabetes mellitus ([place]) 07/30/2011  . Peripheral vascular  disease ([place]) 10/03/2009  . DEGENERATIVE JOINT DISEASE 10/03/2009  . Artery disease, cerebral 07/18/2009  . Obesity 05/12/2009  . Benign hypertension 06/29/2008  . History of tobacco use 06/29/2008  . NICOTINE ADDICTION 09/16/2007  . Prostate cancer ([place]) 09/16/2007    Current Outpatient Prescriptions on File Prior to Visit  Medication Sig Dispense Refill  . Albiglutide 50 MG PEN Inject 50 mg into the skin once a week. 4 each 3  . aspirin EC 81 MG tablet Take 1 tablet (81 mg total) by mouth daily. 90 tablet 3  . B-D ULTRAFINE III SHORT PEN 31G X 8 MM MISC USE AS DIRECTED TWICE DAILY 100 each 11  . bicalutamide (CASODEX) 50 MG tablet Take 50 mg by mouth daily.    . canagliflozin (INVOKANA) 300 MG TABS tablet Take 1 tablet (300 mg total) by mouth daily before breakfast. 30 tablet 5  . carvedilol (COREG) 3.125 MG tablet Take 1 tablet (3.125 mg total) by mouth 2 (two) times daily with a meal. 60 tablet 3  . clopidogrel (PLAVIX) 75 MG tablet Take 1 tablet (75 mg total) by mouth daily. 30 tablet 11  . ezetimibe (ZETIA) 10 MG tablet Take 1 tablet (10 mg total) by mouth daily. 30 tablet 3  . furosemide (LASIX) 40 MG tablet Take 1 tablet (40 mg total) by mouth daily as needed for fluid or edema. 30 tablet 3  . glucose blood (ONE TOUCH ULTRA TEST) test strip USE AS DIRECTED TO TEST BLOOD SUGARS TWICE DAILY.  Diagnosis:  E11.59   Insulin-dependent. 200 each 3  . insulin aspart (NOVOLOG FLEXPEN) 100 UNIT/ML FlexPen Inject 40 Units into the skin 3 (three) times daily with meals. 30 mL 2  .  Insulin Glargine (TOUJEO SOLOSTAR) 300 UNIT/ML SOPN Inject 110 Units into the skin at bedtime. 30 pen 2  . leuprolide (LUPRON) 30 MG injection Inject 45 mg into the muscle every 6 (six) months.    Marland Kitchen losartan (COZAAR) 25 MG tablet Take 0.5 tablets (12.5 mg total) by mouth daily. 30 tablet 6  . NITROSTAT 0.4 MG SL tablet Place 1 tablet (0.4 mg total) under the tongue as needed. 25 tablet 3  . potassium chloride SA  (K-DUR,KLOR-CON) 20 MEQ tablet Take 1 tablet (20 mEq total) by mouth daily as needed (Take one tabley if you take a Lasix tablet). 30 tablet 3   No current facility-administered medications on file prior to visit.    Allergies  Allergen Reactions  . Metformin And Related Nausea Only  . Rosuvastatin Calcium     REACTION: JOINT ACHES  . Statins     REACTION: JOINT ACHES    Objective:  General: Well developed, nourished, in no acute distress, alert and oriented x3   Dermatology: Skin is warm, dry and supple bilateral.Right hallux nail bed appears to be clean, dry, with mild granular tissue and surrounding eschar/scab. (-) Erythema. (-) Edema. (-) serosanguous drainage present. The remaining nails appear unremarkable at this time, asymptomatic. There are no other lesions or other signs of infection present.  Neurovascular status: unchanged. No lower extremity swelling; No pain with calf compression bilateral.  Musculoskeletal: Decreased tenderness to palpation of the Right hallux nail bed. Muscular strength within normal limits bilateral.   Assesement and Plan: Problem List Items Addressed This Visit    None    Visit Diagnoses    S/P nail surgery    -  Primary    Pain of toe, unspecified laterality        Diabetic polyneuropathy associated with type 2 diabetes mellitus ([place])           -Examined patient  -Cleansed right hallux nail bed and gently scrubbed with peroxide and q-tip/curetted away eschar at site and applied antibiotic cream covered with bandaid.  -Discussed plan of care with patient. -Patient to now begin soaking in a weak solution of Epsom salt and warm water. Patient was instructed to soak for 15-20 minutes each day until the toe appears normal and there is no drainage, redness, tenderness, or swelling at the procedure site, and apply neosporin and a gauze or bandaid dressing each day as needed. May leave open to air at night. -Educated patient on long term care after  nail surgery. -Patient was instructed to monitor the toe for reoccurrence and signs of infection; Patient advised to return to office or go to ER if toe becomes red, hot or swollen. -Encouraged daily inspection of feet in the setting of diabetes. -Patient is to return in 3 months for routine diabetic foot care/nail trim or sooner if problems arise.  Landis Martins, DPM

## 2016-03-05 ENCOUNTER — Other Ambulatory Visit: Payer: Self-pay

## 2016-03-05 DIAGNOSIS — E785 Hyperlipidemia, unspecified: Secondary | ICD-10-CM

## 2016-03-05 MED ORDER — EZETIMIBE 10 MG PO TABS: 10.0000 mg | ORAL_TABLET | Freq: Every day | ORAL | 6 refills | Status: DC

## 2016-03-07 ENCOUNTER — Other Ambulatory Visit: Payer: Self-pay | Admitting: Internal Medicine

## 2016-03-19 ENCOUNTER — Other Ambulatory Visit: Payer: Self-pay | Admitting: Internal Medicine

## 2016-03-20 ENCOUNTER — Other Ambulatory Visit (INDEPENDENT_AMBULATORY_CARE_PROVIDER_SITE_OTHER): Payer: BLUE CROSS/BLUE SHIELD

## 2016-03-20 DIAGNOSIS — E785 Hyperlipidemia, unspecified: Secondary | ICD-10-CM

## 2016-03-21 LAB — LIPID PANEL
Chol/HDL Ratio: 5.1 ratio units — ABNORMAL HIGH (ref 0.0–5.0)
Cholesterol, Total: 175 mg/dL (ref 100–199)
HDL: 34 mg/dL — ABNORMAL LOW (ref >39)
LDL Calculated: 103 mg/dL — ABNORMAL HIGH (ref 0–99)
Triglycerides: 188 mg/dL — ABNORMAL HIGH (ref 0–149)
VLDL Cholesterol Cal: 38 mg/dL (ref 5–40)

## 2016-03-21 LAB — HEPATIC FUNCTION PANEL
ALT: 19 IU/L (ref 0–44)
AST: 16 IU/L (ref 0–40)
Albumin: 4.1 g/dL (ref 3.6–4.8)
Alkaline Phosphatase: 74 IU/L (ref 39–117)
Bilirubin Total: 0.5 mg/dL (ref 0.0–1.2)
Bilirubin, Direct: 0.14 mg/dL (ref 0.00–0.40)
Total Protein: 6.2 g/dL (ref 6.0–8.5)

## 2016-03-22 ENCOUNTER — Other Ambulatory Visit: Payer: Self-pay | Admitting: Internal Medicine

## 2016-03-30 ENCOUNTER — Other Ambulatory Visit: Payer: Self-pay | Admitting: *Deleted

## 2016-03-30 DIAGNOSIS — I2510 Atherosclerotic heart disease of native coronary artery without angina pectoris: Secondary | ICD-10-CM

## 2016-03-30 DIAGNOSIS — I10 Essential (primary) hypertension: Secondary | ICD-10-CM

## 2016-03-30 DIAGNOSIS — E785 Hyperlipidemia, unspecified: Secondary | ICD-10-CM

## 2016-03-30 DIAGNOSIS — R079 Chest pain, unspecified: Secondary | ICD-10-CM

## 2016-03-30 MED ORDER — CARVEDILOL 3.125 MG PO TABS: 3.1250 mg | ORAL_TABLET | Freq: Two times a day (BID) | ORAL | 3 refills | Status: DC

## 2016-05-16 ENCOUNTER — Encounter: Payer: BLUE CROSS/BLUE SHIELD | Admitting: Cardiothoracic Surgery

## 2016-05-17 ENCOUNTER — Other Ambulatory Visit: Payer: Self-pay | Admitting: Family Medicine

## 2016-05-18 ENCOUNTER — Ambulatory Visit (INDEPENDENT_AMBULATORY_CARE_PROVIDER_SITE_OTHER): Payer: BLUE CROSS/BLUE SHIELD | Admitting: Cardiothoracic Surgery

## 2016-05-18 ENCOUNTER — Encounter: Payer: Self-pay | Admitting: Internal Medicine

## 2016-05-18 ENCOUNTER — Encounter: Payer: Self-pay | Admitting: Cardiothoracic Surgery

## 2016-05-18 ENCOUNTER — Ambulatory Visit (INDEPENDENT_AMBULATORY_CARE_PROVIDER_SITE_OTHER): Payer: BLUE CROSS/BLUE SHIELD | Admitting: Internal Medicine

## 2016-05-18 VITALS — BP 108/63 | HR 74 | Resp 16 | Ht 69.0 in | Wt 270.0 lb

## 2016-05-18 DIAGNOSIS — Z23 Encounter for immunization: Secondary | ICD-10-CM | POA: Diagnosis not present

## 2016-05-18 DIAGNOSIS — Z951 Presence of aortocoronary bypass graft: Secondary | ICD-10-CM

## 2016-05-18 DIAGNOSIS — E1159 Type 2 diabetes mellitus with other circulatory complications: Secondary | ICD-10-CM | POA: Diagnosis not present

## 2016-05-18 DIAGNOSIS — I739 Peripheral vascular disease, unspecified: Secondary | ICD-10-CM | POA: Diagnosis not present

## 2016-05-18 DIAGNOSIS — E1165 Type 2 diabetes mellitus with hyperglycemia: Secondary | ICD-10-CM

## 2016-05-18 LAB — POCT GLYCOSYLATED HEMOGLOBIN (HGB A1C): Hemoglobin A1C: 7.2

## 2016-05-18 MED ORDER — INSULIN GLARGINE 300 UNIT/ML ~~LOC~~ SOPN: 120.0000 [IU] | PEN_INJECTOR | Freq: Every day | SUBCUTANEOUS | 2 refills | Status: DC

## 2016-05-18 NOTE — Progress Notes (Signed)
Patient ID: Phillip Clements, male   DOB: Dec 30, 1950, 65 y.o.   MRN: [id number]  HPI: Phillip Clements is a 64 y.o.-year-old male, returning for f/u for DM2, dx in 1996, insulin-dependent since 2008, uncontrolled, with complications (CAD - s/p stents, CABG, cerebro-vascular ds - s/p CVA, PVD). Last visit 3 mo ago.  Last hemoglobin A1c was: Lab Results  Component Value Date   HGBA1C 7.3 02/16/2016   HGBA1C 7.2 11/03/2015   HGBA1C 7.8 08/05/2015   Pt is on a regimen of: - Invokana 300 mg daily - Tanzeum 50 mg weekly - Toujeo 110 (2x 55 units) at bedtime - Novolog 32-40 units 3x a day - 15 min before meals He tried Victoza and Januvia Had GI intolerance (nausea, diarrhea) to Metformin.  Pt checks his sugars 1-3x a day and they are: - am:158-198, 207 >> 130-160, 226 >> after workout: 93-197, 209, 247 (higher when forgets insulin) >> 103-214, 235 - 2h after b'fast: n/c - before lunch: 94-130 >> 82, 126-208 >> 73, 130-140, 180 >> 80-90 after golf, 122-230 >> 95-193, 228 - 2h after lunch: n/c - before dinner: 159, 167 >> 94-130 >> 136 >> 120s >> 101-219 >> 124-163, 205 - 2h after dinner: n/c - bedtime: 198, 254 >> 120 >> 307 >> n/c - nighttime: n/c No lows. Lowest sugar was 77 >> 60s >> low while playing Golf; he has hypoglycemia awareness at 70s.  Highest sugar was 340 >> 220x1 >> 307 >> 200s.  Glucometer: OneTouch Ultra mini  Pt's meals are: - Breakfast: thin bagel + cream cheese + special K cinnamon cereal - Lunch: sandwich, chips, cottage cheese, pineapple - Dinner: 2 veggies, salads; Sat night: meat - Snacks: 2/day: celery + PB; low salt triscuit + laughing cow cheese  Exercise: cardio + weights daily; golf on wed. Sugars great after golf.  - no CKD, last BUN/creatinine:  Lab Results  Component Value Date   BUN 20 07/26/2015   CREATININE 0.89 07/26/2015  On Losartan - last set of lipids: Lab Results  Component Value Date   CHOL 175 03/20/2016   HDL 34 (L) 03/20/2016    LDLCALC 103 (H) 03/20/2016   LDLDIRECT 157.1 09/07/2013   TRIG 188 (H) 03/20/2016   CHOLHDL 5.1 (H) 03/20/2016  Statins >> joint pain.  - last eye exam was in 05/30/2015. [facility]. No DR. Had cataract sx. - no numbness and tingling in his feet.  ROS: Constitutional: no weight gain/loss, no fatigue, no subjective hyperthermia/hypothermia, + nocturia Eyes: no blurry vision, no xerophthalmia ENT: no sore throat, no nodules palpated in throat, no dysphagia/odynophagia, no hoarseness Cardiovascular: no CP/SOB/palpitations/leg swelling Respiratory: no cough/SOB Gastrointestinal: no N/V/D/C Musculoskeletal: no muscle/joint aches Skin: no rashes Neurological: no tremors/numbness/tingling/dizziness  I reviewed pt's medications, allergies, PMH, social hx, family hx, and changes were documented in the history of present illness. Otherwise, unchanged from my initial visit note:  Past Medical History:  Diagnosis Date  . Coronary artery disease    a. CABG 04/2014: (LIMA--> LAD, SVG --> DIAG, SVG--> OM1, SVG--> PDA)  b. early graft failure. s/p successful rotational atherectomy and stenting of the left main and proximal LAD with DES. DES of mid LAD.(07/22/14)  . CVA (cerebral infarction)   . Hyperlipidemia   . Hypertension 2000  . OSA on CPAP   . Peripheral vascular disease ([place])    left SFA angioplasty in December 2016  . Prostate cancer ([place])   . Prostate cancer ([place])   . Prostate cancer (  [place])   . Prostate cancer ([place])   . Stroke ([place])   . Type II diabetes mellitus ([place])    Past Surgical History:  Procedure Laterality Date  . ANTERIOR CERVICAL DECOMP/DISCECTOMY FUSION  08/25/2012   Procedure: ANTERIOR CERVICAL DECOMPRESSION/DISCECTOMY FUSION 2 LEVELS;  Surgeon: Hosie Spangle, MD;  Location: [facility];  Service: Neurosurgery;  Laterality: N/A;  Cervical five-six Cervical six-seven anterior cervical decompression with fusion and plating and bonegraft  . BACK SURGERY    .  CARDIAC CATHETERIZATION  04/2014; 07/19/2014  . CORONARY ARTERY BYPASS GRAFT N/A 04/26/2014   Procedure: CORONARY ARTERY BYPASS GRAFTING (CABG), on pump, times four, using left internal mammary artery, right greater saphenous vein harvested endoscopically.;  Surgeon: Ivin Poot, MD;  Location: [place];  Service: Open Heart Surgery;  Laterality: N/A;  LIMA to LAD, SVG to DIAGONAL, SVG to OM1, SVG to PDA) with EVH of the RIGHT THIGH and LOWER EXTREMITY SAPHENOUS VEIN  . Cytoscopy prostatic stone o/w nml  06/21/08   Dr. Jacqlyn Larsen  . ETT myoview  09/13/09   Low risk EF 49%  . High intense focused ultrasound  07/20/06   By Dr. Jacqlyn Larsen  . INTRAOPERATIVE TRANSESOPHAGEAL ECHOCARDIOGRAM N/A 04/26/2014   Procedure: INTRAOPERATIVE TRANSESOPHAGEAL ECHOCARDIOGRAM;  Surgeon: Ivin Poot, MD;  Location: [place];  Service: Open Heart Surgery;  Laterality: N/A;  . PERCUTANEOUS CORONARY ROTOBLATOR INTERVENTION (PCI-R) N/A 07/22/2014   Procedure: PERCUTANEOUS CORONARY ROTOBLATOR INTERVENTION (PCI-R);  Surgeon: Peter M Martinique, MD;  Location: [facility] CATH LAB;  Service: Cardiovascular;  Laterality: N/A;  . PERIPHERAL VASCULAR CATHETERIZATION Left 07/26/2015   Procedure: Lower Extremity Angiography;  Surgeon: Katha Cabal, MD;  Location: [place] CV LAB;  Service: Cardiovascular;  Laterality: Left;  . PERIPHERAL VASCULAR CATHETERIZATION  07/26/2015   Procedure: Lower Extremity Intervention;  Surgeon: Katha Cabal, MD;  Location: [place] CV LAB;  Service: Cardiovascular;;  . PROSTATE BIOPSY  04/03/06 & 02/25/07   Social History   Social History  . Marital status: Married    Spouse name: N/A  . Number of children: 3  . Years of education: N/A   Occupational History  . Insurance account manager    Social History Main Topics  . Smoking status: Former Smoker    Packs/day: 1.00    Years: 36.00    Types: Cigarettes    Quit date: 03/01/2014  . Smokeless tobacco: Never Used  . Alcohol  use Yes     Comment: 07/19/2014 "might have a drink a couple times/yr"  . Drug use: No  . Sexual activity: Not Currently   Other Topics Concern  . Not on file   Social History Narrative   Lives with wife.   2 daughters and 1 son.   [employer] fan   Runs [employer]   Current Outpatient Prescriptions on File Prior to Visit  Medication Sig Dispense Refill  . aspirin EC 81 MG tablet Take 1 tablet (81 mg total) by mouth daily. 90 tablet 3  . B-D ULTRAFINE III SHORT PEN 31G X 8 MM MISC USE AS DIRECTED TWICE DAILY 100 each 11  . bicalutamide (CASODEX) 50 MG tablet Take 50 mg by mouth daily.    . canagliflozin (INVOKANA) 300 MG TABS tablet Take 1 tablet (300 mg total) by mouth daily before breakfast. 30 tablet 5  . carvedilol (COREG) 3.125 MG tablet Take 1 tablet (3.125 mg total) by mouth 2 (two) times daily with a meal. 60 tablet 3  .  clopidogrel (PLAVIX) 75 MG tablet Take 1 tablet (75 mg total) by mouth daily. 30 tablet 11  . ezetimibe (ZETIA) 10 MG tablet Take 1 tablet (10 mg total) by mouth daily. 30 tablet 6  . furosemide (LASIX) 40 MG tablet Take 1 tablet (40 mg total) by mouth daily as needed for fluid or edema. 30 tablet 3  . glucose blood (ONE TOUCH ULTRA TEST) test strip USE AS DIRECTED TO TEST BLOOD SUGARS TWICE DAILY.  Diagnosis:  E11.59   Insulin-dependent. 200 each 3  . insulin aspart (NOVOLOG FLEXPEN) 100 UNIT/ML FlexPen Inject 40 Units into the skin 3 (three) times daily with meals. 30 mL 2  . Insulin Glargine (TOUJEO SOLOSTAR) 300 UNIT/ML SOPN Inject 110 Units into the skin at bedtime. 30 pen 2  . leuprolide (LUPRON) 30 MG injection Inject 45 mg into the muscle every 6 (six) months.    Marland Kitchen losartan (COZAAR) 25 MG tablet Take 0.5 tablets (12.5 mg total) by mouth daily. 30 tablet 6  . NITROSTAT 0.4 MG SL tablet Place 1 tablet (0.4 mg total) under the tongue as needed. 25 tablet 3  . NOVOLOG FLEXPEN 100 UNIT/ML FlexPen INJECT 32 UNITS INTO THE SKIN 3 TIMES DAILY WITH MEALS 30 mL 2   . potassium chloride SA (K-DUR,KLOR-CON) 20 MEQ tablet Take 1 tablet (20 mEq total) by mouth daily as needed (Take one tabley if you take a Lasix tablet). 30 tablet 3  . TANZEUM 50 MG PEN INJECT 50MG  INTO THE SKIN ONCE A WEEK 4 each 3  . UNIFINE PENTIPS 29G X 12MM MISC USE AS DIRECTED TWICE DAILY 100 each 5   No current facility-administered medications on file prior to visit.    Allergies  Allergen Reactions  . Metformin And Related Nausea Only  . Rosuvastatin Calcium     REACTION: JOINT ACHES  . Statins     REACTION: JOINT ACHES   Family History  Problem Relation Age of Onset  . Diabetes Mother 47    DM  . Coronary artery disease Mother   . Hypertension Mother   . Heart attack Mother     CABG with MI in 2005  . Heart disease Mother     CV  . Hypertension Father   . Heart attack Father     CABG with Mi around 1997  . Coronary artery disease Father   . Heart disease Father     CV  . Diabetes Father   . Heart attack Brother     MI/PTCA  . Heart disease Brother     CV  . Diabetes Brother   . Heart attack Maternal Grandfather     MI  . Prostate cancer Paternal Grandfather   . Breast cancer Neg Hx     Breast/ovarian/uterine cancer  . Colon cancer Neg Hx   . Depression Neg Hx   . Alcohol abuse Neg Hx     ETOH/drug abuse  . Stroke Neg Hx   . Diabetes Sister     DM   PE: BP (P) 140/78 (BP Location: Left Arm, Patient Position: Sitting)   Pulse (P) 67   Ht (P) 5\' 10"  (1.778 m)   Wt (P) 272 lb (123.4 kg)   SpO2 (P) 97%   BMI (P) 39.03 kg/m  Body mass index is 39.03 kg/m (pended). Wt Readings from Last 3 Encounters:  05/18/16 (P) 272 lb (123.4 kg)  02/16/16 270 lb (122.5 kg)  01/19/16 271 lb (122.9 kg)   Constitutional: obese, in  NAD Eyes: PERRLA, EOMI, no exophthalmos ENT: moist mucous membranes, no thyromegaly, no cervical lymphadenopathy Cardiovascular: RRR, No MRG Respiratory: CTA B Gastrointestinal: abdomen protuberant, soft, NT, ND,  BS+ Musculoskeletal: no deformities, strength intact in all 4 Skin: moist, warm, no rashes Neurological: no tremor with outstretched hands, DTR normal in all 4  ASSESSMENT: 1. DM2, insulin-dependent, uncontrolled, with complications - CAD - s/p stents, CABG - cerebro-vascular ds - s/p CVA  - PVD  PLAN:  1. Patient with long-standing, uncontrolled diabetes, with variable control on basal - bolus insulin regimen + GLP1 R agonist and SGLT2 inhibitor. At last visit, we restart Metformin ER and increased iNVOKANA. Also, at last visit, since he was taking NovoLog after meals, we moved this before meals. I advised him about alarms as he was occasionally forgetting to take it. At last visit, we also discussed about U500 insulin >> will wait until he changes insurances >> [facility] in 1 mo. - as sugars are high in am >> for now will increase Toujeo, but I advised him to check some sugars at bedtime and increase evening Novolog if these are high - I suggested to:  Patient Instructions  Please continue: - Invokana 300 mg daily - Tanzeum 50 mg weekly - Novolog 40 units 3x a day - 15 min before meals  Please check sugars at bedtime and adjust the dinnertime Novolog accordingly.  For now, please increase Toujeo to 120 units (60 x2).  Please check with [facility] about U500 insulin price.  Please come back for a follow-up appointment in 3 months.  - continue checking sugars at different times of the day - check 3 times a day, rotating checks - advised for yearly eye exams >> he is UTD, But needs one soon - will give flu shot today - will check HbA1c today >> 7.2% (same) - Return to clinic in 3 mo with sugar log   Philemon Kingdom, MD PhD [facility] Endocrinology

## 2016-05-18 NOTE — Progress Notes (Signed)
PCP is Elsie Stain, MD Referring Provider is Wellington Hampshire, MD  Chief Complaint  Patient presents with  . Coronary Artery Disease    6 month f/u , HX of CABG    HPI: 6 month follow-up for CAD treated with CABG over 2 years ago with graft closure due to severe diabetic disease and poor targets. Patient subsequently treated with PCI of the LAD-left main. He appears to be functioning well without significant angina. He has a scheduled 30 minute aerobic exercise. At least 4 days a week and is playing golf at least once a week. He denies any significant anginal symptoms with these activities. He complains mainly of generalized weakness he attributes to Lupron injections for advanced age prostate cancer. The patient's last ejection fraction was 60% by echo last year. He also has peripheral vascular disease has had a PCI of his left SFA. He has claudication of both legs walking up the stairs at home.  Past Medical History:  Diagnosis Date  . Coronary artery disease    a. CABG 04/2014: (LIMA--> LAD, SVG --> DIAG, SVG--> OM1, SVG--> PDA)  b. early graft failure. s/p successful rotational atherectomy and stenting of the left main and proximal LAD with DES. DES of mid LAD.(07/22/14)  . CVA (cerebral infarction)   . Hyperlipidemia   . Hypertension 2000  . OSA on CPAP   . Peripheral vascular disease ([place])    left SFA angioplasty in December 2016  . Prostate cancer ([place])   . Prostate cancer ([place])   . Prostate cancer ([place])   . Prostate cancer ([place])   . Stroke ([place])   . Type II diabetes mellitus ([place])     Past Surgical History:  Procedure Laterality Date  . ANTERIOR CERVICAL DECOMP/DISCECTOMY FUSION  08/25/2012   Procedure: ANTERIOR CERVICAL DECOMPRESSION/DISCECTOMY FUSION 2 LEVELS;  Surgeon: Hosie Spangle, MD;  Location: [facility];  Service: Neurosurgery;  Laterality: N/A;  Cervical five-six Cervical six-seven anterior cervical decompression with fusion and plating and bonegraft  . BACK  SURGERY    . CARDIAC CATHETERIZATION  04/2014; 07/19/2014  . CORONARY ARTERY BYPASS GRAFT N/A 04/26/2014   Procedure: CORONARY ARTERY BYPASS GRAFTING (CABG), on pump, times four, using left internal mammary artery, right greater saphenous vein harvested endoscopically.;  Surgeon: Ivin Poot, MD;  Location: [place];  Service: Open Heart Surgery;  Laterality: N/A;  LIMA to LAD, SVG to DIAGONAL, SVG to OM1, SVG to PDA) with EVH of the RIGHT THIGH and LOWER EXTREMITY SAPHENOUS VEIN  . Cytoscopy prostatic stone o/w nml  06/21/08   Dr. Jacqlyn Larsen  . ETT myoview  09/13/09   Low risk EF 49%  . High intense focused ultrasound  07/20/06   By Dr. Jacqlyn Larsen  . INTRAOPERATIVE TRANSESOPHAGEAL ECHOCARDIOGRAM N/A 04/26/2014   Procedure: INTRAOPERATIVE TRANSESOPHAGEAL ECHOCARDIOGRAM;  Surgeon: Ivin Poot, MD;  Location: [place];  Service: Open Heart Surgery;  Laterality: N/A;  . PERCUTANEOUS CORONARY ROTOBLATOR INTERVENTION (PCI-R) N/A 07/22/2014   Procedure: PERCUTANEOUS CORONARY ROTOBLATOR INTERVENTION (PCI-R);  Surgeon: Zanobia Griebel M Martinique, MD;  Location: [facility] CATH LAB;  Service: Cardiovascular;  Laterality: N/A;  . PERIPHERAL VASCULAR CATHETERIZATION Left 07/26/2015   Procedure: Lower Extremity Angiography;  Surgeon: Katha Cabal, MD;  Location: [place] CV LAB;  Service: Cardiovascular;  Laterality: Left;  . PERIPHERAL VASCULAR CATHETERIZATION  07/26/2015   Procedure: Lower Extremity Intervention;  Surgeon: Katha Cabal, MD;  Location: [place] CV LAB;  Service: Cardiovascular;;  . PROSTATE BIOPSY  04/03/06 &  02/25/07    Family History  Problem Relation Age of Onset  . Diabetes Mother 58    DM  . Coronary artery disease Mother   . Hypertension Mother   . Heart attack Mother     CABG with MI in 2005  . Heart disease Mother     CV  . Hypertension Father   . Heart attack Father     CABG with Mi around 1997  . Coronary artery disease Father   . Heart disease Father     CV  . Diabetes Father   .  Heart attack Brother     MI/PTCA  . Heart disease Brother     CV  . Diabetes Brother   . Heart attack Maternal Grandfather     MI  . Prostate cancer Paternal Grandfather   . Breast cancer Neg Hx     Breast/ovarian/uterine cancer  . Colon cancer Neg Hx   . Depression Neg Hx   . Alcohol abuse Neg Hx     ETOH/drug abuse  . Stroke Neg Hx   . Diabetes Sister     DM    Social History Social History  Substance Use Topics  . Smoking status: Former Smoker    Packs/day: 1.00    Years: 36.00    Types: Cigarettes    Quit date: 03/01/2014  . Smokeless tobacco: Never Used  . Alcohol use Yes     Comment: 07/19/2014 "might have a drink a couple times/yr"    Current Outpatient Prescriptions  Medication Sig Dispense Refill  . aspirin EC 81 MG tablet Take 1 tablet (81 mg total) by mouth daily. 90 tablet 3  . B Complex Vitamins (VITAMIN B COMPLEX PO) Take by mouth.    . B-D ULTRAFINE III SHORT PEN 31G X 8 MM MISC USE AS DIRECTED TWICE DAILY 100 each 11  . bicalutamide (CASODEX) 50 MG tablet Take 50 mg by mouth daily.    . canagliflozin (INVOKANA) 300 MG TABS tablet Take 1 tablet (300 mg total) by mouth daily before breakfast. 30 tablet 5  . carvedilol (COREG) 3.125 MG tablet Take 1 tablet (3.125 mg total) by mouth 2 (two) times daily with a meal. 60 tablet 3  . clopidogrel (PLAVIX) 75 MG tablet Take 1 tablet (75 mg total) by mouth daily. 30 tablet 11  . ezetimibe (ZETIA) 10 MG tablet Take 1 tablet (10 mg total) by mouth daily. 30 tablet 6  . furosemide (LASIX) 40 MG tablet Take 1 tablet (40 mg total) by mouth daily as needed for fluid or edema. 30 tablet 3  . glucose blood (ONE TOUCH ULTRA TEST) test strip USE AS DIRECTED TO TEST BLOOD SUGARS TWICE DAILY.  Diagnosis:  E11.59   Insulin-dependent. 200 each 3  . insulin aspart (NOVOLOG FLEXPEN) 100 UNIT/ML FlexPen Inject 40 Units into the skin 3 (three) times daily with meals. 30 mL 2  . Insulin Glargine (TOUJEO SOLOSTAR) 300 UNIT/ML SOPN Inject  120 Units into the skin at bedtime. 30 pen 2  . leuprolide (LUPRON) 30 MG injection Inject 45 mg into the muscle every 6 (six) months.    Marland Kitchen losartan (COZAAR) 25 MG tablet Take 0.5 tablets (12.5 mg total) by mouth daily. 30 tablet 6  . NITROSTAT 0.4 MG SL tablet Place 1 tablet (0.4 mg total) under the tongue as needed. 25 tablet 3  . NOVOLOG FLEXPEN 100 UNIT/ML FlexPen INJECT 32 UNITS INTO THE SKIN 3 TIMES DAILY WITH MEALS 30 mL 2  .  potassium chloride SA (K-DUR,KLOR-CON) 20 MEQ tablet Take 1 tablet (20 mEq total) by mouth daily as needed (Take one tabley if you take a Lasix tablet). 30 tablet 3  . TANZEUM 50 MG PEN INJECT 50MG  INTO THE SKIN ONCE A WEEK 4 each 3  . UNIFINE PENTIPS 29G X 12MM MISC USE AS DIRECTED TWICE DAILY 100 each 5   No current facility-administered medications for this visit.     Allergies  Allergen Reactions  . Metformin And Related Nausea Only  . Rosuvastatin Calcium     REACTION: JOINT ACHES  . Statins     REACTION: JOINT ACHES    Review of Systems   Diabetes under adequate controlled according to his endocrinologist-A1c 7.2 No ankle edema orthopnea or PND Mild stable visual complaints related to diabetes He is careful with a vegetarian diet and states that intake of meat results and reflux symptoms No nocturia and PSA remains 0.0 Bilateral lower leg claudication when he climbs stairs at home No focal weakness or TIAs  BP 108/63   Pulse 74   Resp 16   Ht 5\' 9"  (1.753 m)   Wt 270 lb (122.5 kg)   SpO2 98% Comment: RA  BMI 39.87 kg/m  Physical Exam      Exam    General- alert and comfortable   Lungs- clear without rales, wheezes   Cor- regular rate and rhythm, no murmur , gallop   Abdomen- soft, non-tender, obese   Extremities - warm, non-tender, minimal edema   Neuro- oriented, appropriate, no focal weakness   Diagnostic Tests: None   Impression: Patient tolerating normal daily activities without angina No symptoms of CHF  Plan: Return to  see me in 6 months for follow-up   Len Childs, MD [facility] [phone]

## 2016-05-18 NOTE — Patient Instructions (Addendum)
Please continue: - Invokana 300 mg daily - Tanzeum 50 mg weekly - Novolog 40 units 3x a day - 15 min before meals  Please check sugars at bedtime and adjust the dinnertime Novolog accordingly.  For now, please increase Toujeo to 120 units (60 x2).  Please check with [facility] about U500 insulin price.  Please come back for a follow-up appointment in 3 months.

## 2016-05-21 ENCOUNTER — Other Ambulatory Visit: Payer: Self-pay | Admitting: *Deleted

## 2016-05-23 ENCOUNTER — Other Ambulatory Visit: Payer: Self-pay

## 2016-06-29 LAB — HM DIABETES EYE EXAM

## 2016-07-03 ENCOUNTER — Encounter: Payer: Self-pay | Admitting: Pediatrics

## 2016-07-09 ENCOUNTER — Encounter: Payer: Self-pay | Admitting: Pharmacist

## 2016-07-12 ENCOUNTER — Ambulatory Visit (INDEPENDENT_AMBULATORY_CARE_PROVIDER_SITE_OTHER): Payer: Medicare Other | Admitting: Pharmacist

## 2016-07-12 VITALS — Wt 273.0 lb

## 2016-07-12 DIAGNOSIS — E7800 Pure hypercholesterolemia, unspecified: Secondary | ICD-10-CM

## 2016-07-12 NOTE — Patient Instructions (Signed)
I will start paperwork for Repatha injection and call you once I hear back from your insurance company.  Call Phillip Clements in lipid clinic with any questions [phone].

## 2016-07-12 NOTE — Progress Notes (Signed)
Patient ID: CASSIE FINCKE                 DOB: 1951/05/22                    MRN: [id number]     HPI: Phillip Clements is a 65 y.o. male patient referred to lipid clinic by Dr Fletcher Anon. PMH of CAD s/p CABG 2015, CVA, HLD, DM, and PVD. Pt reports experiencing myalgias with all statins and presents today for further lipid management.  Pt states that myalgias started within 2 weeks after starting low-dose Crestor on an alternative dosing schedule. This was a similar onset of side effects as he experienced with pravastatin and Lipitor. He is tolerating Zetia well. He has been on a plant-based diet for past year. He eats a lot of carbs and vegetables, but little meat.  Pt stays active but has been more fatigued lately due to his Leupron injections for prostate cancer. This has been affecting his exercise routine.  Current Medications: Zetia 10 mg daily Intolerances: pravastatin 40 mg daily, Crestor 5 mg daily, Lipitor unknown dose Risk Factors: CAD, CVA, HLD, DM, PVD, significant FamHx,  LDL goal: <70 mg/dL for ASCVD  Diet: Breakfast: thin bagel + cream cheese + cashews.  Lunch: Special K and cashews sandwich, chips, cottage cheese, pineapple. Dinner: veggies and salad, tofu meat. Eats meat on Saturday nights.  Drinks diet soda and water. Avoids drinking alcohol. Avoids fast food.  Exercise: Walks 30 minutes 4-5 days per week. Engages in cardio + weights 2x week. Plays golf on Wednesdays.   Family History: DM, CAD, HTN, CABG with MI (mother). HTN, MI, CAD, DM (father). MI, DM (brother), DM (sister), MI (paternal grandfather)  Social History: Former smoker of cigarettes, 1 PPD for 36 years. Quit in 2015. Drinks alcohol about 1 per year   Labs: 03/20/2016: TC 175, TG 188, HDL 34, LDL 103 (Zetia 10mg  daily)   Past Medical History:  Diagnosis Date  . Coronary artery disease    a. CABG 04/2014: (LIMA--> LAD, SVG --> DIAG, SVG--> OM1, SVG--> PDA)  b. early graft failure. s/p successful rotational  atherectomy and stenting of the left main and proximal LAD with DES. DES of mid LAD.(07/22/14)  . CVA (cerebral infarction)   . Hyperlipidemia   . Hypertension 2000  . OSA on CPAP   . Peripheral vascular disease ([place])    left SFA angioplasty in December 2016  . Prostate cancer ([place])   . Prostate cancer ([place])   . Prostate cancer ([place])   . Prostate cancer ([place])   . Stroke ([place])   . Type II diabetes mellitus ([place])     Current Outpatient Prescriptions on File Prior to Visit  Medication Sig Dispense Refill  . aspirin EC 81 MG tablet Take 1 tablet (81 mg total) by mouth daily. 90 tablet 3  . B Complex Vitamins (VITAMIN B COMPLEX PO) Take by mouth.    . B-D ULTRAFINE III SHORT PEN 31G X 8 MM MISC USE AS DIRECTED TWICE DAILY 100 each 11  . bicalutamide (CASODEX) 50 MG tablet Take 50 mg by mouth daily.    . canagliflozin (INVOKANA) 300 MG TABS tablet Take 1 tablet (300 mg total) by mouth daily before breakfast. 30 tablet 5  . carvedilol (COREG) 3.125 MG tablet Take 1 tablet (3.125 mg total) by mouth 2 (two) times daily with a meal. 60 tablet 3  . clopidogrel (PLAVIX) 75 MG tablet Take 1 tablet (75  mg total) by mouth daily. 30 tablet 11  . ezetimibe (ZETIA) 10 MG tablet Take 1 tablet (10 mg total) by mouth daily. 30 tablet 6  . furosemide (LASIX) 40 MG tablet Take 1 tablet (40 mg total) by mouth daily as needed for fluid or edema. 30 tablet 3  . glucose blood (ONE TOUCH ULTRA TEST) test strip USE AS DIRECTED TO TEST BLOOD SUGARS TWICE DAILY.  Diagnosis:  E11.59   Insulin-dependent. 200 each 3  . insulin aspart (NOVOLOG FLEXPEN) 100 UNIT/ML FlexPen Inject 40 Units into the skin 3 (three) times daily with meals. 30 mL 2  . Insulin Glargine (TOUJEO SOLOSTAR) 300 UNIT/ML SOPN Inject 120 Units into the skin at bedtime. 30 pen 2  . leuprolide (LUPRON) 30 MG injection Inject 45 mg into the muscle every 6 (six) months.    Marland Kitchen losartan (COZAAR) 25 MG tablet Take 0.5 tablets (12.5 mg total) by mouth daily.  30 tablet 6  . NITROSTAT 0.4 MG SL tablet Place 1 tablet (0.4 mg total) under the tongue as needed. 25 tablet 3  . NOVOLOG FLEXPEN 100 UNIT/ML FlexPen INJECT 32 UNITS INTO THE SKIN 3 TIMES DAILY WITH MEALS 30 mL 2  . potassium chloride SA (K-DUR,KLOR-CON) 20 MEQ tablet Take 1 tablet (20 mEq total) by mouth daily as needed (Take one tabley if you take a Lasix tablet). 30 tablet 3  . TANZEUM 50 MG PEN INJECT 50MG  INTO THE SKIN ONCE A WEEK 4 each 3  . UNIFINE PENTIPS 29G X 12MM MISC USE AS DIRECTED TWICE DAILY 100 each 5   No current facility-administered medications on file prior to visit.     Allergies  Allergen Reactions  . Metformin And Related Nausea Only  . Rosuvastatin Calcium     REACTION: JOINT ACHES  . Statins     REACTION: JOINT ACHES    Assessment/Plan:  1. Hyperlipidemia - LDL 103mg /dL on Zetia 10mg  daily which is above goal 70mg /dL given history of extensive ASCVD. Pt is intolerant to Crestor 5mg  daily and every other day, pravastatin 40mg  daily, and unknown dose of atorvastatin. He is resistant to trying another statin. Discussed benefit, injection technique, and side effects of PCSK9i therapy. Will submit prior authorization for Praluent injections. Of note, pt is switching to [facility] insurance in March 2018 and paperwork will need to be resubmitted at that time. Pt is aware his copay will also increase at that time.   Megan E. Supple, PharmD, [place] [id number] [address] [place], [place] [address] Phone: [phone]; Fax: [phone] 07/12/2016 1:49 PM

## 2016-07-13 DIAGNOSIS — Z6838 Body mass index (BMI) 38.0-38.9, adult: Secondary | ICD-10-CM | POA: Diagnosis not present

## 2016-07-13 DIAGNOSIS — C7951 Secondary malignant neoplasm of bone: Secondary | ICD-10-CM | POA: Diagnosis not present

## 2016-07-13 DIAGNOSIS — C61 Malignant neoplasm of prostate: Secondary | ICD-10-CM | POA: Diagnosis not present

## 2016-07-13 DIAGNOSIS — N393 Stress incontinence (female) (male): Secondary | ICD-10-CM | POA: Diagnosis not present

## 2016-07-13 DIAGNOSIS — R339 Retention of urine, unspecified: Secondary | ICD-10-CM | POA: Diagnosis not present

## 2016-07-16 ENCOUNTER — Telehealth: Payer: Self-pay | Admitting: Cardiovascular Disease

## 2016-07-16 ENCOUNTER — Telehealth: Payer: Self-pay | Admitting: Pharmacist

## 2016-07-16 MED ORDER — EVOLOCUMAB 140 MG/ML ~~LOC~~ SOAJ: 1.0000 "pen " | SUBCUTANEOUS | 3 refills | Status: DC

## 2016-07-16 NOTE — Telephone Encounter (Signed)
Pharmacy calling asking for ICD 10 code for repatha Also need PCN for patient insurance  Please call back  [phone]

## 2016-07-16 NOTE — Telephone Encounter (Signed)
Repatha approved through 01/07/17. Rx sent to [facility] specialty pharmacy. Pt made aware and will call with any problems.

## 2016-07-16 NOTE — Telephone Encounter (Signed)
Please review. Thanks!  

## 2016-07-16 NOTE — Telephone Encounter (Signed)
Called specialty pharmacy back and provided them with pt's insurance info and ICD 10 code for hyperlipidemia.

## 2016-07-17 ENCOUNTER — Encounter: Payer: Self-pay | Admitting: Family Medicine

## 2016-07-20 ENCOUNTER — Telehealth: Payer: Self-pay | Admitting: Cardiovascular Disease

## 2016-07-20 NOTE — Telephone Encounter (Signed)
Pharmacy calling asking for update on a PA that needed to be done  for repatha  Please call back  [phone]

## 2016-07-20 NOTE — Telephone Encounter (Signed)
This was already done and approved over a week ago. Called back and advised them that prescription should be ready to fill.

## 2016-07-20 NOTE — Telephone Encounter (Signed)
Pt requiring PA for Repatha please contact on update.

## 2016-07-27 ENCOUNTER — Encounter: Payer: Self-pay | Admitting: Family Medicine

## 2016-07-27 ENCOUNTER — Ambulatory Visit (INDEPENDENT_AMBULATORY_CARE_PROVIDER_SITE_OTHER): Payer: Medicare Other | Admitting: Family Medicine

## 2016-07-27 VITALS — BP 122/70 | HR 68 | Temp 97.6°F | Wt 273.0 lb

## 2016-07-27 DIAGNOSIS — R1310 Dysphagia, unspecified: Secondary | ICD-10-CM

## 2016-07-27 DIAGNOSIS — R531 Weakness: Secondary | ICD-10-CM

## 2016-07-27 DIAGNOSIS — Z1211 Encounter for screening for malignant neoplasm of colon: Secondary | ICD-10-CM

## 2016-07-27 NOTE — Progress Notes (Signed)
Pre visit review using our clinic review tool, if applicable. No additional management support is needed unless otherwise documented below in the visit note. 

## 2016-07-27 NOTE — Patient Instructions (Signed)
I'll check with cards/vascular about the coreg.  Phillip Clements will call about your referral. We'll work on getting the cologuard test sent to your house.  Take care.  Glad to see you.

## 2016-07-27 NOTE — Progress Notes (Signed)
"  I'm a 65 year old in and 65 year old body".   Still on treatment for prostate cancer.   He is most bothered by dec in stamina and strength.   Has been on lupron for ~5 years but has noted more changes in strength recently.   A friend of his died about 1.5 years ago with esophageal cancer.  He is worried about that, ie that particular possible dx.  Some longstanding occ dysphagia.  Not vomiting blood.  He has good indication for current meds  D/w pt.  He didn't know if beta blocker was affecting his endurance/weakness.   He is still trying to work out mult days per week, but his endurance is limited compared to previous levels.    He didn't think he was depressed, not tearful, no SI/HI.  "I have a lot of things to live for."    D/w patient [id number] for colon cancer screening, including IFOB vs cologuard vs colonoscopy.  Risks and benefits of both were discussed and patient voiced understanding.  Pt elects for: cologuard.   PMH and SH reviewed  ROS: Per HPI unless specifically indicated in ROS section   Meds, vitals, and allergies reviewed.   GEN: nad, alert and oriented HEENT: mucous membranes moist NECK: supple w/o LA CV: rrr. PULM: ctab, no inc wob ABD: soft, +bs EXT: no edema SKIN: no acute rash

## 2016-07-29 DIAGNOSIS — R1310 Dysphagia, unspecified: Secondary | ICD-10-CM | POA: Insufficient documentation

## 2016-07-29 DIAGNOSIS — R5383 Other fatigue: Secondary | ICD-10-CM | POA: Insufficient documentation

## 2016-07-29 DIAGNOSIS — Z1211 Encounter for screening for malignant neoplasm of colon: Secondary | ICD-10-CM | POA: Insufficient documentation

## 2016-07-29 NOTE — Assessment & Plan Note (Signed)
He doesn't have alarming symptoms that he has had some long-standing stable dysphagia. Reasonable to try to avoid EGD given his other health considerations. Start with barium swallow. Refer for that. We will go from there. He agrees. >25 minutes spent in face to face time with patient, >50% spent in counselling or coordination of care

## 2016-07-29 NOTE — Assessment & Plan Note (Signed)
He opts for: cologuard, this is reasonable. We will help him get this set up.

## 2016-07-29 NOTE — Assessment & Plan Note (Addendum)
Nonfocal. He didn't know if this was related to his beta blocker use. Discussed with patient. I am hesitant to change this in the meantime. I will last cardiology and cardiothoracic surgery input on his beta blocker use.  I don't think he is clinically depressed. He also does not think that he is depressed.

## 2016-08-02 ENCOUNTER — Telehealth: Payer: Self-pay | Admitting: Family Medicine

## 2016-08-02 NOTE — Telephone Encounter (Signed)
Spoke to pt. Made appt 08-10-16

## 2016-08-02 NOTE — Telephone Encounter (Signed)
Call pt.  Okay to stop carvedilol and see if fatigue gets better.  Either way, have him update me in about 1 week.  Thanks.

## 2016-08-02 NOTE — Telephone Encounter (Signed)
-----   Message from Wellington Hampshire, MD sent at 07/31/2016  9:44 AM EST ----- He is only on small dose carvedilol. This can be held especially that his most recent ejection fraction was normal so there is no strong indication for treatment with a beta blocker. I suspect that his fatigue is likely multifactorial.  M.A   ----- Message ----- From: Tonia Ghent, MD Sent: 07/29/2016   9:55 PM To: Ivin Poot, MD, Wellington Hampshire, MD  Patient describes decrease in exercise tolerance and generalized weakness. He does not have any focal changes. He questioned if this could be related to his beta blocker use. I did not change his dose yet. Are you okay with cutting his beta blocker in half temporarily to see if he gets any improvement? Thank you.  Brigitte Pulse

## 2016-08-08 ENCOUNTER — Other Ambulatory Visit: Payer: Self-pay | Admitting: *Deleted

## 2016-08-08 MED ORDER — CLOPIDOGREL BISULFATE 75 MG PO TABS: 75.0000 mg | ORAL_TABLET | Freq: Every day | ORAL | 1 refills | Status: DC

## 2016-08-09 ENCOUNTER — Other Ambulatory Visit: Payer: Self-pay

## 2016-08-09 MED ORDER — INSULIN GLARGINE 300 UNIT/ML ~~LOC~~ SOPN: 120.0000 [IU] | PEN_INJECTOR | Freq: Every day | SUBCUTANEOUS | 2 refills | Status: DC

## 2016-08-10 ENCOUNTER — Other Ambulatory Visit: Payer: Self-pay

## 2016-08-10 ENCOUNTER — Encounter: Payer: Self-pay | Admitting: Family Medicine

## 2016-08-10 ENCOUNTER — Ambulatory Visit (INDEPENDENT_AMBULATORY_CARE_PROVIDER_SITE_OTHER): Payer: Medicare Other | Admitting: Family Medicine

## 2016-08-10 DIAGNOSIS — R5383 Other fatigue: Secondary | ICD-10-CM

## 2016-08-10 MED ORDER — CLOPIDOGREL BISULFATE 75 MG PO TABS: 75.0000 mg | ORAL_TABLET | Freq: Every day | ORAL | 0 refills | Status: DC

## 2016-08-10 NOTE — Progress Notes (Signed)
He is off beta blocker in the meantime.  He thinks his fatigue is clearly better in the meantime.  He clearly feels better in the meantime.  He can walk 30 min now.  He does better- he doesn't "hit a wall" at 15 min now like he prev did.  He gets up stairs better now.  No exertional CP.  BP controlled today, d/w pt.    Meds, vitals, and allergies reviewed.   ROS: Per HPI unless specifically indicated in ROS section   GEN: nad, alert and oriented HEENT: mucous membranes moist NECK: supple w/o LA CV: rrr PULM: ctab, no inc wob ABD: soft, +bs EXT: no edema SKIN: no acute rash

## 2016-08-10 NOTE — Patient Instructions (Addendum)
Stay off the coreg for now.  I'll update cardiology and vascular surgery.   Take care.  Glad to see you.  Update me as needed.

## 2016-08-10 NOTE — Progress Notes (Signed)
Pre visit review using our clinic review tool, if applicable. No additional management support is needed unless otherwise documented below in the visit note. 

## 2016-08-13 NOTE — Assessment & Plan Note (Signed)
He clearly feels better off carvedilol.  Unclear if this was due solely to carvedilol or if it was an issue with beta blockers in general. Discussed with patient. Either way stay off carvedilol for now. He agrees.

## 2016-08-14 DIAGNOSIS — Z1212 Encounter for screening for malignant neoplasm of rectum: Secondary | ICD-10-CM | POA: Diagnosis not present

## 2016-08-14 DIAGNOSIS — Z1211 Encounter for screening for malignant neoplasm of colon: Secondary | ICD-10-CM | POA: Diagnosis not present

## 2016-08-15 ENCOUNTER — Ambulatory Visit
Admission: RE | Admit: 2016-08-15 | Discharge: 2016-08-15 | Disposition: A | Discharge status: Home / Self Care | Payer: Medicare Other | Source: Ambulatory Visit | Attending: Family Medicine | Admitting: Family Medicine

## 2016-08-15 DIAGNOSIS — K219 Gastro-esophageal reflux disease without esophagitis: Secondary | ICD-10-CM | POA: Diagnosis not present

## 2016-08-15 DIAGNOSIS — R1310 Dysphagia, unspecified: Secondary | ICD-10-CM | POA: Diagnosis not present

## 2016-08-16 ENCOUNTER — Telehealth: Payer: Self-pay | Admitting: Pharmacist

## 2016-08-16 NOTE — Telephone Encounter (Signed)
Spoke with pt to confirm he started Repatha injections. Pt states he did his second injection 2 days ago and is not having any problems. Advised him to call specialty pharmacy for refill since he had not heard from them. Pt sees Dr Fletcher Anon on 1/31 - will route a message to him to see if we can recheck a lipid panel that day to assess efficacy of Repatha injections.

## 2016-08-19 ENCOUNTER — Telehealth: Payer: Self-pay | Admitting: Family Medicine

## 2016-08-19 NOTE — Telephone Encounter (Signed)
See below, notify pt.  Thanks.

## 2016-08-19 NOTE — Telephone Encounter (Signed)
-----   Message from Wellington Hampshire, MD sent at 08/17/2016  8:04 PM EST ----- Ok . I will check on him when he comes to see me later this month. Thanks.   ----- Message ----- From: Tonia Ghent, MD Sent: 08/13/2016   1:19 PM To: Ivin Poot, MD, Wellington Hampshire, MD  He clearly feels better off carvedilol.  Unclear if this was due solely to carvedilol or if it was an issue with beta blockers in general.  I left him off beta blocker at this point.  I will defer to you both if you think he should be on another med.  Thanks.    Brigitte Pulse

## 2016-08-20 ENCOUNTER — Encounter: Payer: Self-pay | Admitting: Internal Medicine

## 2016-08-20 ENCOUNTER — Ambulatory Visit (INDEPENDENT_AMBULATORY_CARE_PROVIDER_SITE_OTHER): Payer: Medicare Other | Admitting: Internal Medicine

## 2016-08-20 VITALS — BP 132/84 | HR 74 | Ht 69.0 in | Wt 274.0 lb

## 2016-08-20 DIAGNOSIS — E1159 Type 2 diabetes mellitus with other circulatory complications: Secondary | ICD-10-CM | POA: Diagnosis not present

## 2016-08-20 DIAGNOSIS — E1165 Type 2 diabetes mellitus with hyperglycemia: Secondary | ICD-10-CM

## 2016-08-20 LAB — COLOGUARD: Cologuard: NEGATIVE

## 2016-08-20 LAB — POCT GLYCOSYLATED HEMOGLOBIN (HGB A1C): Hemoglobin A1C: 8.1

## 2016-08-20 MED ORDER — INVOKAMET XR 150-500 MG PO TB24: 2.0000 | ORAL_TABLET | Freq: Every day | ORAL | 3 refills | Status: DC

## 2016-08-20 NOTE — Telephone Encounter (Signed)
Patient advised.

## 2016-08-20 NOTE — Progress Notes (Signed)
Patient ID: Phillip Clements, male   DOB: 02-01-1951, 66 y.o.   MRN: [id number]  HPI: Phillip Clements is a 66 y.o.-year-old male, returning for f/u for DM2, dx in 1996, insulin-dependent since 2008, uncontrolled, with complications (CAD - s/p stents, CABG, cerebro-vascular ds - s/p CVA, PVD). Last visit 3 mo ago. He will change to [place] in 10/2016.  Last hemoglobin A1c was: Lab Results  Component Value Date   HGBA1C 7.2 05/18/2016   HGBA1C 7.3 02/16/2016   HGBA1C 7.2 11/03/2015   Pt is on a regimen of: - Invokana 300 mg daily - Tanzeum 50 mg weekly - Novolog 40 units 3x a day - 15 min before meals - Toujeo 120 units (60 x2) He tried Victoza and Januvia Had GI intolerance (nausea, diarrhea) to Metformin.  Pt checks his sugars 1-3x a day and they are higher after Thanksgiving: - am: after workout: 93-197, 209, 247 (higher when forgets insulin) >> 103-214, 235 >> 127, 153-272 - 2h after b'fast: n/c - before lunch:  73, 130-140, 180 >> 80-90 after golf, 122-230 >> 95-193, 228 >> 117-282 - 2h after lunch: n/c - before dinner: 159, 167 >> 94-130 >> 136 >> 120s >> 101-219 >> 124-163, 205 >> 133, 152-303 - 2h after dinner: n/c - bedtime: 198, 254 >> 120 >> 307 >> n/c >> 166, 244-376 - nighttime: n/c No lows. Lowest sugar was 60s >> 127; he has hypoglycemia awareness at 70s.  Highest sugar was 200s >> 300s.  Glucometer: OneTouch Ultra mini  Pt's meals are: - Breakfast: thin bagel + cream cheese + special K cinnamon cereal - Lunch: sandwich, chips, cottage cheese, pineapple - Dinner: 2 veggies, salads; Sat night: meat - Snacks: 2/day: celery + PB; low salt triscuit + laughing cow cheese  Exercise: previously cardio + weights daily; golf on wed. Now only walking.  - no CKD, last BUN/creatinine:  Lab Results  Component Value Date   BUN 20 07/26/2015   CREATININE 0.89 07/26/2015  On Losartan - last set of lipids: Lab Results  Component Value Date   CHOL 175 03/20/2016   HDL 34  (L) 03/20/2016   LDLCALC 103 (H) 03/20/2016   LDLDIRECT 157.1 09/07/2013   TRIG 188 (H) 03/20/2016   CHOLHDL 5.1 (H) 03/20/2016  Statins >> joint pain. Started Repatha q 2 weeks On Zetia also. (Dr. Fletcher Anon) - last eye exam was in 06/2016. [facility]. No DR. Had cataract sx. - no numbness and tingling in his feet.  ROS: Constitutional: no weight gain/loss, no fatigue, no subjective hyperthermia/hypothermia Eyes: no blurry vision, no xerophthalmia ENT: no sore throat, no nodules palpated in throat, no dysphagia/odynophagia, no hoarseness Cardiovascular: no CP/SOB/palpitations/leg swelling Respiratory: no cough/SOB Gastrointestinal: no N/V/D/C Musculoskeletal: no muscle/joint aches Skin: no rashes Neurological: no tremors/numbness/tingling/dizziness  I reviewed pt's medications, allergies, PMH, social hx, family hx, and changes were documented in the history of present illness. Otherwise, unchanged from my initial visit note:  Past Medical History:  Diagnosis Date  . Coronary artery disease    a. CABG 04/2014: (LIMA--> LAD, SVG --> DIAG, SVG--> OM1, SVG--> PDA)  b. early graft failure. s/p successful rotational atherectomy and stenting of the left main and proximal LAD with DES. DES of mid LAD.(07/22/14)  . CVA (cerebral infarction)   . Hyperlipidemia   . Hypertension 2000  . OSA on CPAP   . Peripheral vascular disease ([place])    left SFA angioplasty in December 2016  . Prostate cancer ([place])   .  Prostate cancer ([place])   . Prostate cancer ([place])   . Prostate cancer ([place])   . Stroke ([place])   . Type II diabetes mellitus ([place])    Past Surgical History:  Procedure Laterality Date  . ANTERIOR CERVICAL DECOMP/DISCECTOMY FUSION  08/25/2012   Procedure: ANTERIOR CERVICAL DECOMPRESSION/DISCECTOMY FUSION 2 LEVELS;  Surgeon: Hosie Spangle, MD;  Location: [place] [facility];  Service: Neurosurgery;  Laterality: N/A;  Cervical five-six Cervical six-seven anterior cervical decompression with  fusion and plating and bonegraft  . BACK SURGERY    . CARDIAC CATHETERIZATION  04/2014; 07/19/2014  . CORONARY ARTERY BYPASS GRAFT N/A 04/26/2014   Procedure: CORONARY ARTERY BYPASS GRAFTING (CABG), on pump, times four, using left internal mammary artery, right greater saphenous vein harvested endoscopically.;  Surgeon: Ivin Poot, MD;  Location: [facility];  Service: Open Heart Surgery;  Laterality: N/A;  LIMA to LAD, SVG to DIAGONAL, SVG to OM1, SVG to PDA) with EVH of the RIGHT THIGH and LOWER EXTREMITY SAPHENOUS VEIN  . Cytoscopy prostatic stone o/w nml  06/21/08   Dr. Jacqlyn Larsen  . ETT myoview  09/13/09   Low risk EF 49%  . High intense focused ultrasound  07/20/06   By Dr. Jacqlyn Larsen  . INTRAOPERATIVE TRANSESOPHAGEAL ECHOCARDIOGRAM N/A 04/26/2014   Procedure: INTRAOPERATIVE TRANSESOPHAGEAL ECHOCARDIOGRAM;  Surgeon: Ivin Poot, MD;  Location: [facility];  Service: Open Heart Surgery;  Laterality: N/A;  . PERCUTANEOUS CORONARY ROTOBLATOR INTERVENTION (PCI-R) N/A 07/22/2014   Procedure: PERCUTANEOUS CORONARY ROTOBLATOR INTERVENTION (PCI-R);  Surgeon: Peter M Martinique, MD;  Location: [facility] CATH LAB;  Service: Cardiovascular;  Laterality: N/A;  . PERIPHERAL VASCULAR CATHETERIZATION Left 07/26/2015   Procedure: Lower Extremity Angiography;  Surgeon: Katha Cabal, MD;  Location: [place] CV LAB;  Service: Cardiovascular;  Laterality: Left;  . PERIPHERAL VASCULAR CATHETERIZATION  07/26/2015   Procedure: Lower Extremity Intervention;  Surgeon: Katha Cabal, MD;  Location: [place] CV LAB;  Service: Cardiovascular;;  . PROSTATE BIOPSY  04/03/06 & 02/25/07   Social History   Social History  . Marital status: Married    Spouse name: N/A  . Number of children: 3  . Years of education: N/A   Occupational History  . Insurance account manager    Social History Main Topics  . Smoking status: Former Smoker    Packs/day: 1.00    Years: 36.00    Types: Cigarettes    Quit date:  03/01/2014  . Smokeless tobacco: Never Used  . Alcohol use Yes     Comment: 07/19/2014 "might have a drink a couple times/yr"  . Drug use: No  . Sexual activity: Not Currently   Other Topics Concern  . Not on file   Social History Narrative   Lives with wife.   2 daughters and 1 son.   [employer]   Former offensive lineman.    Current Outpatient Prescriptions on File Prior to Visit  Medication Sig Dispense Refill  . aspirin EC 81 MG tablet Take 1 tablet (81 mg total) by mouth daily. 90 tablet 3  . B Complex Vitamins (VITAMIN B COMPLEX PO) Take by mouth.    . B-D ULTRAFINE III SHORT PEN 31G X 8 MM MISC USE AS DIRECTED TWICE DAILY 100 each 11  . bicalutamide (CASODEX) 50 MG tablet Take 50 mg by mouth daily.    . canagliflozin (INVOKANA) 300 MG TABS tablet Take 1 tablet (300 mg total) by mouth daily before breakfast.  30 tablet 5  . clopidogrel (PLAVIX) 75 MG tablet Take 1 tablet (75 mg total) by mouth daily. 30 tablet 0  . Evolocumab (REPATHA SURECLICK) [id number] MG/ML SOAJ Inject 1 pen into the skin every 14 (fourteen) days. 6 pen 3  . ezetimibe (ZETIA) 10 MG tablet Take 1 tablet (10 mg total) by mouth daily. 30 tablet 6  . furosemide (LASIX) 40 MG tablet Take 1 tablet (40 mg total) by mouth daily as needed for fluid or edema. 30 tablet 3  . glucose blood (ONE TOUCH ULTRA TEST) test strip USE AS DIRECTED TO TEST BLOOD SUGARS TWICE DAILY.  Diagnosis:  E11.59   Insulin-dependent. 200 each 3  . insulin aspart (NOVOLOG FLEXPEN) 100 UNIT/ML FlexPen Inject 40 Units into the skin 3 (three) times daily with meals. 30 mL 2  . Insulin Glargine (TOUJEO SOLOSTAR) 300 UNIT/ML SOPN Inject 120 Units into the skin at bedtime. 30 pen 2  . leuprolide (LUPRON) 30 MG injection Inject 45 mg into the muscle every 6 (six) months.    Marland Kitchen losartan (COZAAR) 25 MG tablet Take 0.5 tablets (12.5 mg total) by mouth daily. 30 tablet 6  . NITROSTAT 0.4 MG SL tablet Place 1 tablet (0.4 mg total) under the  tongue as needed. 25 tablet 3  . potassium chloride SA (K-DUR,KLOR-CON) 20 MEQ tablet Take 1 tablet (20 mEq total) by mouth daily as needed (Take one tabley if you take a Lasix tablet). 30 tablet 3  . TANZEUM 50 MG PEN INJECT 50MG  INTO THE SKIN ONCE A WEEK 4 each 3  . UNIFINE PENTIPS 29G X 12MM MISC USE AS DIRECTED TWICE DAILY 100 each 5   No current facility-administered medications on file prior to visit.    Allergies  Allergen Reactions  . Coreg [Carvedilol] Other (See Comments)    Fatigue- unclear if this was due to coreg specifically or beta blockers in general.    . Metformin And Related Nausea Only  . Rosuvastatin Calcium     REACTION: JOINT ACHES  . Statins     REACTION: JOINT ACHES   Family History  Problem Relation Age of Onset  . Diabetes Mother 13    DM  . Coronary artery disease Mother   . Hypertension Mother   . Heart attack Mother     CABG with MI in 2005  . Heart disease Mother     CV  . Hypertension Father   . Heart attack Father     CABG with Mi around 1997  . Coronary artery disease Father   . Heart disease Father     CV  . Diabetes Father   . Heart attack Brother     MI/PTCA  . Heart disease Brother     CV  . Diabetes Brother   . Heart attack Maternal Grandfather     MI  . Prostate cancer Paternal Grandfather   . Diabetes Sister     DM  . Breast cancer Neg Hx     Breast/ovarian/uterine cancer  . Colon cancer Neg Hx   . Depression Neg Hx   . Alcohol abuse Neg Hx     ETOH/drug abuse  . Stroke Neg Hx    PE: BP 132/84 (BP Location: Left Arm, Patient Position: Sitting)   Pulse 74   Ht 5\' 9"  (1.753 m)   Wt 274 lb (124.3 kg)   SpO2 97%   BMI 40.46 kg/m  Body mass index is 40.46 kg/m. Wt Readings from Last 3 Encounters:  08/20/16 274 lb (124.3 kg)  08/10/16 273 lb (123.8 kg)  07/27/16 273 lb (123.8 kg)   Constitutional: obese, in NAD Eyes: PERRLA, EOMI, no exophthalmos ENT: moist mucous membranes, no thyromegaly, no cervical  lymphadenopathy Cardiovascular: RRR, No MRG Respiratory: CTA B Gastrointestinal: abdomen protuberant, soft, NT, ND, BS+ Musculoskeletal: no deformities, strength intact in all 4 Skin: moist, warm, + Stasis dermatitis rash on bilateral shins Neurological: no tremor with outstretched hands, DTR normal in all 4  ASSESSMENT: 1. DM2, insulin-dependent, uncontrolled, with complications - CAD - s/p stents, CABG - cerebro-vascular ds - s/p CVA  - PVD  PLAN:  1. Patient with long-standing, uncontrolled diabetes, with poor control on basal - bolus insulin regimen + GLP1 R agonist and SGLT2 inhibitor, worsening after Thanksgiving. His diet and lack of exercise is likely the culprit for the increasing sugars since last visit. We discussed at length about starting an exercise regimen and also improving his diet. He saw nutrition along time ago, and he agrees with another referral now. I would not increase his insulin doses for now, since they are already quite high, however, will try to add InvokaMet XR (iNVOKANA + Glumetza) instead of Invokana. He had intolerance to metformin in the past. - At last visit, we discussed about U500 insulin >> will change to [place] in 2 mo >> we may be able to start at next visit - I suggested to:  Patient Instructions  Please continue: - Tanzeum 50 mg weekly - Novolog 40 units 3x a day - 15 min before meals - Toujeo 120 units (60 x2) at bedtime  Stop Invokana and start Invokamet XR 2 tablets in am, with b'fast.  Please schedule an appt with Antonieta Iba with nutrition.  Please check some sugars at bedtime.  Please come back for a follow-up appointment in 3 months.  - continue checking sugars at different times of the day - check 3 times a day, rotating checks - advised for yearly eye exams >> he is UTD - given flu shot at last visit - will check HbA1c today >> 8.1% (higher) - Return to clinic in 3 mo with sugar log   Philemon Kingdom, MD PhD [facility]  Endocrinology

## 2016-08-20 NOTE — Addendum Note (Signed)
Addended by: Caprice Beaver T on: 08/20/2016 11:56 AM   Modules accepted: Orders

## 2016-08-20 NOTE — Patient Instructions (Addendum)
Please continue: - Tanzeum 50 mg weekly - Novolog 40 units 3x a day - 15 min before meals - Toujeo 120 units (60 x2) at bedtime  Stop Invokana and start Invokamet XR 2 tablets in am, with b'fast.  Please schedule an appt with Antonieta Iba with nutrition.  Please check some sugars at bedtime.  Please come back for a follow-up appointment in 3 months.

## 2016-08-22 ENCOUNTER — Other Ambulatory Visit: Payer: Self-pay | Admitting: *Deleted

## 2016-08-22 MED ORDER — CLOPIDOGREL BISULFATE 75 MG PO TABS: 75.0000 mg | ORAL_TABLET | Freq: Every day | ORAL | 0 refills | Status: DC

## 2016-08-27 ENCOUNTER — Other Ambulatory Visit: Payer: Self-pay | Admitting: *Deleted

## 2016-08-27 MED ORDER — CLOPIDOGREL BISULFATE 75 MG PO TABS: 75.0000 mg | ORAL_TABLET | Freq: Every day | ORAL | 0 refills | Status: DC

## 2016-09-11 ENCOUNTER — Encounter: Payer: Self-pay | Admitting: Cardiovascular Disease

## 2016-09-11 ENCOUNTER — Ambulatory Visit (INDEPENDENT_AMBULATORY_CARE_PROVIDER_SITE_OTHER): Payer: Medicare Other | Admitting: Cardiovascular Disease

## 2016-09-11 VITALS — BP 132/78 | HR 78 | Ht 69.0 in | Wt 273.5 lb

## 2016-09-11 DIAGNOSIS — E785 Hyperlipidemia, unspecified: Secondary | ICD-10-CM | POA: Diagnosis not present

## 2016-09-11 DIAGNOSIS — I25709 Atherosclerosis of coronary artery bypass graft(s), unspecified, with unspecified angina pectoris: Secondary | ICD-10-CM

## 2016-09-11 DIAGNOSIS — I209 Angina pectoris, unspecified: Secondary | ICD-10-CM

## 2016-09-11 DIAGNOSIS — I10 Essential (primary) hypertension: Secondary | ICD-10-CM

## 2016-09-11 DIAGNOSIS — I739 Peripheral vascular disease, unspecified: Secondary | ICD-10-CM

## 2016-09-11 MED ORDER — CLOPIDOGREL BISULFATE 75 MG PO TABS: 75.0000 mg | ORAL_TABLET | Freq: Every day | ORAL | 6 refills | Status: DC

## 2016-09-11 NOTE — Progress Notes (Signed)
Cardiology Office Note   Date:  09/11/2016   ID:  YAHSHUA HASSANI, DOB 06/22/1951, MRN [id number]  PCP:  Elsie Stain, MD  Cardiologist:   [name], MD   Chief Complaint  Patient presents with  . other    6 month follow up. Meds reviewed by the pt. verbally.  Pt. c/o shortness of breath with climbing stairs.       History of Present Illness: Phillip Clements is a 66 y.o. male who presents for a followup visit regarding coronary artery disease .  He has known history of diabetes for at least 25 years of duration which has not been optimally controlled. He also has other medical conditions that include  Hyperlipidemia, PAD status post left SFA angioplasty in December 2016,  tobacco use, obesity and prostate cancer.  He had CABG in September 2015 for severe three-vessel coronary artery disease.  Ejection fraction was 45%.  He developed recurrent angina  in November, 2015. He underwent a nuclear stress test which was abnormal. He had inferolateral ST elevation with exercise. Nuclear imaging showed evidence of inferior as well as anterior ischemia. Repeat cardiac catheterization showed occluded grafts except for SVG to OM 2. LIMA to LAD was atretic. Ejection fraction was 55%. He underwent atherectomy and stenting of the left main and LAD .  He underwent left SFA angioplasty by Dr. Delana Meyer in December 2016. He reports minimal improvement in claudication. He is intolerant to statins. He is tolerating Zetia and Evolocumab.  He denies any chest pain. He describes mild exertional dyspnea. He exercises on the treadmill almost on a daily basis for about 30 minutes. He starts at the speed of 1.9 mph and then goes up to 3 mi./h. He denies claudication. He does not use furosemide. He was taken off carvedilol due to fatigue and reports some improvement in symptoms.  Past Medical History:  Diagnosis Date  . Coronary artery disease    a. CABG 04/2014: (LIMA--> LAD, SVG --> DIAG, SVG--> OM1,  SVG--> PDA)  b. early graft failure. s/p successful rotational atherectomy and stenting of the left main and proximal LAD with DES. DES of mid LAD.(07/22/14)  . CVA (cerebral infarction)   . Hyperlipidemia   . Hypertension 2000  . OSA on CPAP   . Peripheral vascular disease ([place])    left SFA angioplasty in December 2016  . Prostate cancer ([place])   . Prostate cancer ([place])   . Prostate cancer ([place])   . Prostate cancer ([place])   . Stroke ([place])   . Type II diabetes mellitus ([place])     Past Surgical History:  Procedure Laterality Date  . ANTERIOR CERVICAL DECOMP/DISCECTOMY FUSION  08/25/2012   Procedure: ANTERIOR CERVICAL DECOMPRESSION/DISCECTOMY FUSION 2 LEVELS;  Surgeon: Hosie Spangle, MD;  Location: [facility];  Service: Neurosurgery;  Laterality: N/A;  Cervical five-six Cervical six-seven anterior cervical decompression with fusion and plating and bonegraft  . BACK SURGERY    . CARDIAC CATHETERIZATION  04/2014; 07/19/2014  . CORONARY ARTERY BYPASS GRAFT N/A 04/26/2014   Procedure: CORONARY ARTERY BYPASS GRAFTING (CABG), on pump, times four, using left internal mammary artery, right greater saphenous vein harvested endoscopically.;  Surgeon: Ivin Poot, MD;  Location: [facility];  Service: Open Heart Surgery;  Laterality: N/A;  LIMA to LAD, SVG to DIAGONAL, SVG to OM1, SVG to PDA) with EVH of the RIGHT THIGH and LOWER EXTREMITY SAPHENOUS VEIN  . Cytoscopy prostatic stone o/w nml  06/21/08   Dr.  Cope  . ETT myoview  09/13/09   Low risk EF 49%  . High intense focused ultrasound  07/20/06   By Dr. Jacqlyn Larsen  . INTRAOPERATIVE TRANSESOPHAGEAL ECHOCARDIOGRAM N/A 04/26/2014   Procedure: INTRAOPERATIVE TRANSESOPHAGEAL ECHOCARDIOGRAM;  Surgeon: Ivin Poot, MD;  Location: [facility];  Service: Open Heart Surgery;  Laterality: N/A;  . PERCUTANEOUS CORONARY ROTOBLATOR INTERVENTION (PCI-R) N/A 07/22/2014   Procedure: PERCUTANEOUS CORONARY ROTOBLATOR INTERVENTION (PCI-R);  Surgeon: Peter M Martinique, MD;  Location:  [facility] CATH LAB;  Service: Cardiovascular;  Laterality: N/A;  . PERIPHERAL VASCULAR CATHETERIZATION Left 07/26/2015   Procedure: Lower Extremity Angiography;  Surgeon: Katha Cabal, MD;  Location: [facility] CV LAB;  Service: Cardiovascular;  Laterality: Left;  . PERIPHERAL VASCULAR CATHETERIZATION  07/26/2015   Procedure: Lower Extremity Intervention;  Surgeon: Katha Cabal, MD;  Location: [facility] CV LAB;  Service: Cardiovascular;;  . PROSTATE BIOPSY  04/03/06 & 02/25/07     Current Outpatient Prescriptions  Medication Sig Dispense Refill  . aspirin EC 81 MG tablet Take 1 tablet (81 mg total) by mouth daily. 90 tablet 3  . B Complex Vitamins (VITAMIN B COMPLEX PO) Take by mouth.    . B-D ULTRAFINE III SHORT PEN 31G X 8 MM MISC USE AS DIRECTED TWICE DAILY 100 each 11  . bicalutamide (CASODEX) 50 MG tablet Take 50 mg by mouth daily.    . clopidogrel (PLAVIX) 75 MG tablet Take 1 tablet (75 mg total) by mouth daily. 30 tablet 0  . Evolocumab (REPATHA SURECLICK) [id number] MG/ML SOAJ Inject 1 pen into the skin every 14 (fourteen) days. 6 pen 3  . ezetimibe (ZETIA) 10 MG tablet Take 1 tablet (10 mg total) by mouth daily. 30 tablet 6  . furosemide (LASIX) 40 MG tablet Take 1 tablet (40 mg total) by mouth daily as needed for fluid or edema. 30 tablet 3  . glucose blood (ONE TOUCH ULTRA TEST) test strip USE AS DIRECTED TO TEST BLOOD SUGARS TWICE DAILY.  Diagnosis:  E11.59   Insulin-dependent. 200 each 3  . insulin aspart (NOVOLOG FLEXPEN) 100 UNIT/ML FlexPen Inject 40 Units into the skin 3 (three) times daily with meals. 30 mL 2  . Insulin Glargine (TOUJEO SOLOSTAR) 300 UNIT/ML SOPN Inject 120 Units into the skin at bedtime. 30 pen 2  . INVOKAMET XR 150-500 MG TB24 Take 2 tablets by mouth daily with breakfast. 180 tablet 3  . leuprolide (LUPRON) 30 MG injection Inject 45 mg into the muscle every 6 (six) months.    Marland Kitchen losartan (COZAAR) 25 MG tablet Take 0.5 tablets (12.5 mg total) by mouth daily.  30 tablet 6  . NITROSTAT 0.4 MG SL tablet Place 1 tablet (0.4 mg total) under the tongue as needed. 25 tablet 3  . potassium chloride SA (K-DUR,KLOR-CON) 20 MEQ tablet Take 1 tablet (20 mEq total) by mouth daily as needed (Take one tabley if you take a Lasix tablet). 30 tablet 3  . TANZEUM 50 MG PEN INJECT 50MG  INTO THE SKIN ONCE A WEEK 4 each 3  . UNIFINE PENTIPS 29G X 12MM MISC USE AS DIRECTED TWICE DAILY 100 each 5   No current facility-administered medications for this visit.     Allergies:   Coreg [carvedilol]; Metformin and related; Rosuvastatin calcium; and Statins    Social History:  The patient  reports that he quit smoking about 2 years ago. His smoking use included Cigarettes. He has a 36.00 pack-year smoking history. He has never used smokeless tobacco.  He reports that he drinks alcohol. He reports that he does not use drugs.   Family History:  The patient's family history includes Coronary artery disease in his father and mother; Diabetes in his brother, father, and sister; Diabetes (age of onset: 44) in his mother; Heart attack in his brother, father, maternal grandfather, and mother; Heart disease in his brother, father, and mother; Hypertension in his father and mother; Prostate cancer in his paternal grandfather.    ROS:  Please see the history of present illness.   Otherwise, review of systems are positive for none.   All other systems are reviewed and negative.    PHYSICAL EXAM: VS:  BP 132/78 (BP Location: Left Arm, Patient Position: Sitting, Cuff Size: Large)   Pulse 78   Ht 5\' 9"  (1.753 m)   Wt 273 lb 8 oz (124.1 kg)   BMI 40.39 kg/m  , BMI Body mass index is 40.39 kg/m. GEN: Well nourished, well developed, in no acute distress  HEENT: normal  Neck: no JVD, carotid bruits, or masses Cardiac: RRR; no murmurs, rubs, or gallops,no edema  Respiratory:  clear to auscultation bilaterally, normal work of breathing GI: soft, nontender, nondistended, + BS MS: no  deformity or atrophy  Skin: warm and dry, no rash Neuro:  Strength and sensation are intact Psych: euthymic mood, full affect   EKG:  EKG is ordered today. The ekg ordered today demonstrates  normal sinus rhythm with no significant ST or T wave changes. Possible old inferior infarct.   Recent Labs: 03/20/2016: ALT 19    Lipid Panel    Component Value Date/Time   CHOL 175 03/20/2016 0808   TRIG 188 (H) 03/20/2016 0808   HDL 34 (L) 03/20/2016 0808   CHOLHDL 5.1 (H) 03/20/2016 0808   CHOLHDL 5.3 07/20/2014 0241   VLDL 22 07/20/2014 0241   LDLCALC 103 (H) 03/20/2016 0808   LDLDIRECT 157.1 09/07/2013 0910      Wt Readings from Last 3 Encounters:  09/11/16 273 lb 8 oz (124.1 kg)  08/20/16 274 lb (124.3 kg)  08/10/16 273 lb (123.8 kg)        ASSESSMENT AND PLAN:  1.  Coronary artery disease involving graft disease with other forms of angina: Symptoms are significantly improved from before even after discontinuation of carvedilol. He actually reports improved stamina without carvedilol. Continue dual antiplatelet therapy indefinitely given that he had left main stenting.  2. Peripheral arterial disease: He has no claudication at the present time.  3. Hyperlipidemia: He is intolerant to all statins and currently on Zetia and Repatha. I requested a follow-up lipid and liver profile.  4. Essential hypertension: Blood pressure is well controlled on current medications.    Disposition:   FU with me in 6 months  Signed,  [name], MD  09/11/2016 3:37 PM    [place]

## 2016-09-11 NOTE — Patient Instructions (Signed)
Medication Instructions:  Your physician recommends that you continue on your current medications as directed. Please refer to the Current Medication list given to you today.   Labwork: Lipid and liver profile. Nothing to eat or drink after midnight the evening before your labs.   Testing/Procedures: nones  Follow-Up: Your physician wants you to follow-up in: six months with Dr. Fletcher Anon.  You will receive a reminder letter in the mail two months in advance. If you don't receive a letter, please call our office to schedule the follow-up appointment.   Any Other Special Instructions Will Be Listed Below (If Applicable).     If you need a refill on your cardiac medications before your next appointment, please call your pharmacy.

## 2016-09-17 ENCOUNTER — Other Ambulatory Visit: Payer: Self-pay | Admitting: Internal Medicine

## 2016-09-24 ENCOUNTER — Other Ambulatory Visit (INDEPENDENT_AMBULATORY_CARE_PROVIDER_SITE_OTHER): Payer: Medicare Other

## 2016-09-24 DIAGNOSIS — E785 Hyperlipidemia, unspecified: Secondary | ICD-10-CM

## 2016-09-25 LAB — HEPATIC FUNCTION PANEL
ALT: 26 IU/L (ref 0–44)
AST: 15 IU/L (ref 0–40)
Albumin: 4.5 g/dL (ref 3.6–4.8)
Alkaline Phosphatase: 71 IU/L (ref 39–117)
Bilirubin Total: 0.6 mg/dL (ref 0.0–1.2)
Bilirubin, Direct: 0.2 mg/dL (ref 0.00–0.40)
Total Protein: 6.7 g/dL (ref 6.0–8.5)

## 2016-09-25 LAB — LIPID PANEL
Chol/HDL Ratio: 2.6 ratio units (ref 0.0–5.0)
Cholesterol, Total: 97 mg/dL — ABNORMAL LOW (ref 100–199)
HDL: 38 mg/dL — ABNORMAL LOW (ref >39)
LDL Calculated: 37 mg/dL (ref 0–99)
Triglycerides: 111 mg/dL (ref 0–149)
VLDL Cholesterol Cal: 22 mg/dL (ref 5–40)

## 2016-10-05 ENCOUNTER — Encounter: Payer: Medicare Other | Attending: Internal Medicine | Admitting: Dietician

## 2016-10-05 ENCOUNTER — Encounter: Payer: Self-pay | Admitting: Dietician

## 2016-10-05 DIAGNOSIS — E1159 Type 2 diabetes mellitus with other circulatory complications: Secondary | ICD-10-CM | POA: Diagnosis not present

## 2016-10-05 DIAGNOSIS — E1165 Type 2 diabetes mellitus with hyperglycemia: Secondary | ICD-10-CM | POA: Diagnosis not present

## 2016-10-05 DIAGNOSIS — Z713 Dietary counseling and surveillance: Secondary | ICD-10-CM | POA: Diagnosis not present

## 2016-10-05 NOTE — Progress Notes (Signed)
Diabetes Self-Management Education  Visit Type: First/Initial  Appt. Start Time: 0930 Appt. End Time: 1050  10/05/2016  [name], identified by name and date of birth, is a 66 y.o. male with a diagnosis of Diabetes: Type 2. Other hx includes HTN, CAD, CABG 2-3 years ago, CVA, PVD, and prostate cancer under hormonal treatment.  He states that due to the cancer treatment he has decreased stamina and strength.  He follows a plant based diet most days mostly due to his wife's direction.  He is interested in learning but resistant to change as he states that he wants to enjoy some things about his life.    A1C increased from 7.2% in October 2017 to 8.1% in January 2018.  He states that this was due to the holidays.   Cholesterol 97, triglycerides 111, HDL 38, LDL 37.  Medication Includes Invokamet (which he is giving a good trial but does not like due to increased gas), Tanzeum, 28-40 units of Novolog tid 15 minutes before meals and 120 units of Toujeo each night.  Patient lives with his wife and mother-in-law.  His wife works as a Engineer, maintenance (IT) and is very busy until tax season ends, therefore, during this time of the year, he does the shopping and cooking.  His mother-in-law has advanced alzheimer's disease.  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  He used to play football in college.  ASSESSMENT  Height 5\' 9"  (1.753 m), weight 272 lb (123.4 kg). Body mass index is 40.17 kg/m.      Diabetes Self-Management Education - 10/05/16 0946      Visit Information   Visit Type First/Initial     Initial Visit   Diabetes Type Type 2   Are you currently following a meal plan? Yes   What type of meal plan do you follow? pland based eating   Are you taking your medications as prescribed? Yes   Date Diagnosed [place]   How would you rate your overall health? Fair     Psychosocial Assessment   Patient Belief/Attitude about Diabetes Motivated to manage diabetes    Self-care barriers None   Self-management support Doctor's office;Family   Other persons present Patient   Patient Concerns Nutrition/Meal planning   Special Needs None   Preferred Learning Style No preference indicated   Learning Readiness Ready   How often do you need to have someone help you when you read instructions, pamphlets, or other written materials from your doctor or pharmacy? 1 - Never   What is the last grade level you completed in school? 4 years college     Pre-Education Assessment   Patient understands the diabetes disease and treatment process. Needs Review   Patient understands incorporating nutritional management into lifestyle. Needs Review   Patient undertands incorporating physical activity into lifestyle. Demonstrates understanding / competency   Patient understands using medications safely. Demonstrates understanding / competency   Patient understands monitoring blood glucose, interpreting and using results Needs Review   Patient understands prevention, detection, and treatment of acute complications. Needs Review   Patient understands prevention, detection, and treatment of chronic complications. Demonstrates understanding / competency   Patient understands how to develop strategies to address psychosocial issues. Needs Review   Patient understands how to develop strategies to promote health/change behavior. Needs Review     Complications   Last HgB A1C per patient/outside source 8.1 %  08/20/16   How often do you check your blood  sugar? 1-2 times/day  2-3 times daily   Fasting Blood glucose range (mg/dL) 70-129  reduced from 180 to 118 since started on Invokamet   Number of hypoglycemic episodes per month 0   Number of hyperglycemic episodes per week 0   Have you had a dilated eye exam in the past 12 months? Yes   Have you had a dental exam in the past 12 months? Yes   Are you checking your feet? Yes   How many days per week are you checking your feet? 7      Dietary Intake   Breakfast Bagel with cream cheese, 1-2 ounces cashews and <3/4 cup special K dry (adding the cereal helped since adding Invokamet)  7-7:30   Snack (morning) none   Lunch Out to eat (house salad with thousand [place]) no protein generally OR [employer] 7 layer salad, roast beef, dutch apple pie (once per month)  12   Snack (afternoon) cashews or peanuts  5   Dinner tofu "chicken strips", baked potato, corn OR tofu "roast beef", salad, vegetable OR pinto's and cornbread  7-9   Snack (evening) none   Beverage(s) decafeinated Diet Dr. Malachi Bonds (10-12 cans per day), water, occasional sweet tea with lemon     Exercise   Exercise Type Light (walking / raking leaves)  walks 30 minutes 4 days per week, weights and walking 2 days per week, golf once per week.   How many days per week to you exercise? 7   How many minutes per day do you exercise? 45   Total minutes per week of exercise 315     Patient Education   Previous Diabetes Education Yes (please comment)  about 20 years ago   Disease state  Definition of diabetes, type 1 and 2, and the diagnosis of diabetes   Nutrition management  Role of diet in the treatment of diabetes and the relationship between the three main macronutrients and blood glucose level;Food label reading, portion sizes and measuring food.;Carbohydrate counting;Meal options for control of blood glucose level and chronic complications.   Physical activity and exercise  Role of exercise on diabetes management, blood pressure control and cardiac health.   Monitoring Purpose and frequency of SMBG.;Identified appropriate SMBG and/or A1C goals.   Acute complications Taught treatment of hypoglycemia - the 15 rule.   Chronic complications Relationship between chronic complications and blood glucose control   Psychosocial adjustment Worked with patient to identify barriers to care and solutions;Role of stress on diabetes   Personal strategies to promote health Lifestyle  issues that need to be addressed for better diabetes care     Individualized Goals (developed by patient)   Nutrition General guidelines for healthy choices and portions discussed   Physical Activity Exercise 5-7 days per week;30 minutes per day   Medications take my medication as prescribed   Monitoring  test my blood glucose as discussed   Reducing Risk examine blood glucose patterns     Post-Education Assessment   Patient understands the diabetes disease and treatment process. Demonstrates understanding / competency   Patient understands incorporating nutritional management into lifestyle. Demonstrates understanding / competency   Patient undertands incorporating physical activity into lifestyle. Demonstrates understanding / competency   Patient understands using medications safely. Demonstrates understanding / competency   Patient understands monitoring blood glucose, interpreting and using results Demonstrates understanding / competency   Patient understands prevention, detection, and treatment of acute complications. Demonstrates understanding / competency   Patient understands prevention, detection, and treatment  of chronic complications. Demonstrates understanding / competency   Patient understands how to develop strategies to address psychosocial issues. Demonstrates understanding / competency   Patient understands how to develop strategies to promote health/change behavior. Demonstrates understanding / competency     Outcomes   Expected Outcomes Other (comment)  Demonstrated interest in learning but patient is somewhat resistant to further change   Future DMSE PRN   Program Status Completed      Individualized Plan for Diabetes Self-Management Training:   Learning Objective:  Patient will have a greater understanding of diabetes self-management. Patient education plan is to attend individual and/or group sessions per assessed needs and concerns. He has read Dr. Emilio Math  book on Reversing Diabetes.  Discussed decreasing added fat further.   Plan:   Patient Instructions  Consider sublingual vitamin B-12. Consider Vitamin D supplement (especially from [facility]). Consider increasing your non starchy vegetable intake. Have some form of protein with each meal (nuts, seeds, tofu, vegetarian meat options, beans). Continue to be aware of your carbohydrate intake. Consider trying [employer] and [employer] Continue your active lifestyle. Consider trying 2-4 weeks off of the diet soda to see how you feel and note your weight.   Expected Outcomes:  Other (comment) (Demonstrated interest in learning but patient is somewhat resistant to further change)  Education material provided: Living Well with Diabetes, Food label handouts, A1C conversion sheet and Meal plan card.  I will mail patient a couple recipes we discussed.  If problems or questions, patient to contact team via:  Phone and Email  Future DSME appointment: PRN

## 2016-10-05 NOTE — Patient Instructions (Signed)
Consider sublingual vitamin B-12. Consider Vitamin D supplement (especially from [facility]). Consider increasing your non starchy vegetable intake. Have some form of protein with each meal (nuts, seeds, tofu, vegetarian meat options, beans). Continue to be aware of your carbohydrate intake. Consider trying [employer] and [employer] Continue your active lifestyle. Consider trying 2-4 weeks off of the diet soda to see how you feel and note your weight.

## 2016-10-12 ENCOUNTER — Other Ambulatory Visit: Payer: Self-pay | Admitting: Internal Medicine

## 2016-10-12 ENCOUNTER — Other Ambulatory Visit: Payer: Self-pay | Admitting: Cardiovascular Disease

## 2016-10-12 DIAGNOSIS — R079 Chest pain, unspecified: Secondary | ICD-10-CM

## 2016-10-12 DIAGNOSIS — E785 Hyperlipidemia, unspecified: Secondary | ICD-10-CM

## 2016-10-12 DIAGNOSIS — I10 Essential (primary) hypertension: Secondary | ICD-10-CM

## 2016-10-12 DIAGNOSIS — I2510 Atherosclerotic heart disease of native coronary artery without angina pectoris: Secondary | ICD-10-CM

## 2016-10-16 ENCOUNTER — Encounter: Payer: Self-pay | Admitting: Family Medicine

## 2016-11-03 ENCOUNTER — Other Ambulatory Visit: Payer: Self-pay | Admitting: Internal Medicine

## 2016-11-08 ENCOUNTER — Other Ambulatory Visit: Payer: Self-pay | Admitting: Cardiovascular Disease

## 2016-11-08 DIAGNOSIS — E785 Hyperlipidemia, unspecified: Secondary | ICD-10-CM

## 2016-11-14 ENCOUNTER — Ambulatory Visit (INDEPENDENT_AMBULATORY_CARE_PROVIDER_SITE_OTHER): Payer: Medicare Other | Admitting: Cardiothoracic Surgery

## 2016-11-14 ENCOUNTER — Encounter: Payer: Self-pay | Admitting: Cardiothoracic Surgery

## 2016-11-14 VITALS — BP 126/56 | HR 65 | Resp 16 | Ht 69.0 in | Wt 273.0 lb

## 2016-11-14 DIAGNOSIS — I2510 Atherosclerotic heart disease of native coronary artery without angina pectoris: Secondary | ICD-10-CM | POA: Diagnosis not present

## 2016-11-14 DIAGNOSIS — Z951 Presence of aortocoronary bypass graft: Secondary | ICD-10-CM | POA: Diagnosis not present

## 2016-11-14 NOTE — Progress Notes (Signed)
PCP is Elsie Stain, MD Referring Provider is Wellington Hampshire, MD  Chief Complaint  Patient presents with  . Routine Post Op    6 month f/u s/p CABG X 4 ... 04/26/14    EXH:BZJIRCV 6 month follow-up after multivessel CABG 3 years ago with subsequent graft failure and PCI. Last EF by echo was normal. He continues to do well without angina. He is followed by his cardiologist Dr.Arida He remains active but has to pace himself doing yard work, Careers information officer, Social research officer, government.   Past Medical History:  Diagnosis Date  . Coronary artery disease    a. CABG 04/2014: (LIMA--> LAD, SVG --> DIAG, SVG--> OM1, SVG--> PDA)  b. early graft failure. s/p successful rotational atherectomy and stenting of the left main and proximal LAD with DES. DES of mid LAD.(07/22/14)  . CVA (cerebral infarction)   . Hyperlipidemia   . Hypertension 2000  . OSA on CPAP   . Peripheral vascular disease ([place])    left SFA angioplasty in December 2016  . Prostate cancer ([place])   . Prostate cancer ([place])   . Prostate cancer ([place])   . Prostate cancer ([place])   . Stroke ([place])   . Type II diabetes mellitus ([place])     Past Surgical History:  Procedure Laterality Date  . ANTERIOR CERVICAL DECOMP/DISCECTOMY FUSION  08/25/2012   Procedure: ANTERIOR CERVICAL DECOMPRESSION/DISCECTOMY FUSION 2 LEVELS;  Surgeon: Hosie Spangle, MD;  Location: [facility];  Service: Neurosurgery;  Laterality: N/A;  Cervical five-six Cervical six-seven anterior cervical decompression with fusion and plating and bonegraft  . BACK SURGERY    . CARDIAC CATHETERIZATION  04/2014; 07/19/2014  . CORONARY ARTERY BYPASS GRAFT N/A 04/26/2014   Procedure: CORONARY ARTERY BYPASS GRAFTING (CABG), on pump, times four, using left internal mammary artery, right greater saphenous vein harvested endoscopically.;  Surgeon: Ivin Poot, MD;  Location: [facility];  Service: Open Heart Surgery;  Laterality: N/A;  LIMA to LAD, SVG to DIAGONAL, SVG to OM1, SVG to PDA) with EVH of the RIGHT THIGH  and LOWER EXTREMITY SAPHENOUS VEIN  . Cytoscopy prostatic stone o/w nml  06/21/08   Dr. Jacqlyn Larsen  . ETT myoview  09/13/09   Low risk EF 49%  . High intense focused ultrasound  07/20/06   By Dr. Jacqlyn Larsen  . INTRAOPERATIVE TRANSESOPHAGEAL ECHOCARDIOGRAM N/A 04/26/2014   Procedure: INTRAOPERATIVE TRANSESOPHAGEAL ECHOCARDIOGRAM;  Surgeon: Ivin Poot, MD;  Location: [facility];  Service: Open Heart Surgery;  Laterality: N/A;  . PERCUTANEOUS CORONARY ROTOBLATOR INTERVENTION (PCI-R) N/A 07/22/2014   Procedure: PERCUTANEOUS CORONARY ROTOBLATOR INTERVENTION (PCI-R);  Surgeon: Peter M Martinique, MD;  Location: [facility] C[facility];  Service: Cardiovascular;  Laterality: N/A;  . PERIPHERAL VASCULAR CATHETERIZATION Left 07/26/2015   Procedure: Lower Extremity Angiography;  Surgeon: Katha Cabal, MD;  Location: [facility];  Service: Cardiovascular;  Laterality: Left;  . PERIPHERAL VASCULAR CATHETERIZATION  07/26/2015   Procedure: Lower Extremity Intervention;  Surgeon: Katha Cabal, MD;  Location: [facility] CV LAB;  Service: Cardiovascular;;  . PROSTATE BIOPSY  04/03/06 & 02/25/07    Family History  Problem Relation Age of Onset  . Diabetes Mother 48    DM  . Coronary artery disease Mother   . Hypertension Mother   . Heart attack Mother     CABG with MI in 2005  . Heart disease Mother     CV  . Hypertension Father   . Heart attack Father     CABG with Mi  around 1997  . Coronary artery disease Father   . Heart disease Father     CV  . Diabetes Father   . Heart attack Brother     MI/PTCA  . Heart disease Brother     CV  . Diabetes Brother   . Heart attack Maternal Grandfather     MI  . Prostate cancer Paternal Grandfather   . Diabetes Sister     DM  . Breast cancer Neg Hx     Breast/ovarian/uterine cancer  . Colon cancer Neg Hx   . Depression Neg Hx   . Alcohol abuse Neg Hx     ETOH/drug abuse  . Stroke Neg Hx     Social History Social History  Substance Use Topics  . Smoking  status: Former Smoker    Packs/day: 1.00    Years: 36.00    Types: Cigarettes    Quit date: 03/01/2014  . Smokeless tobacco: Never Used  . Alcohol use Yes     Comment: 07/19/2014 "might have a drink a couple times/yr"    Current Outpatient Prescriptions  Medication Sig Dispense Refill  . aspirin EC 81 MG tablet Take 1 tablet (81 mg total) by mouth daily. 90 tablet 3  . B Complex Vitamins (VITAMIN B COMPLEX PO) Take by mouth.    . B-D ULTRAFINE III SHORT PEN 31G X 8 MM MISC USE AS DIRECTED TWICE DAILY 100 each 11  . bicalutamide (CASODEX) 50 MG tablet Take 50 mg by mouth daily.    . carvedilol (COREG) 3.125 MG tablet TAKE 1 TABLET BY MOUTH TWICE A DAY WITH A MEAL. 60 tablet 3  . clopidogrel (PLAVIX) 75 MG tablet Take 1 tablet (75 mg total) by mouth daily. 30 tablet 6  . Evolocumab (REPATHA SURECLICK) 154 MG/ML SOAJ Inject 1 pen into the skin every 14 (fourteen) days. 6 pen 3  . ezetimibe (ZETIA) 10 MG tablet TAKE 1 TABLET BY MOUTH ONCE DAILY 30 tablet 4  . furosemide (LASIX) 40 MG tablet Take 1 tablet (40 mg total) by mouth daily as needed for fluid or edema. 30 tablet 3  . glucose blood (ONE TOUCH ULTRA TEST) test strip USE AS DIRECTED TO TEST BLOOD SUGARS TWICE DAILY.  Diagnosis:  E11.59   Insulin-dependent. 200 each 3  . INVOKAMET XR 150-500 MG TB24 Take 2 tablets by mouth daily with breakfast. 180 tablet 3  . leuprolide (LUPRON) 30 MG injection Inject 45 mg into the muscle every 6 (six) months.    Mar[place] Kitchen losartan (COZAAR) 25 MG tablet Take 0.5 tablets (12.5 mg total) by mouth daily. 30 tablet 6  . NOVOLOG FLEXPEN 100 UNIT/ML FlexPen INJECT 40 UNITS INTO THE SKIN 3 TIMES DAILY WITH MEALS 30 mL 2  . potassium chloride SA (K-DUR,KLOR-CON) 20 MEQ tablet Take 1 tablet (20 mEq total) by mouth daily as needed (Take one tabley if you take a Lasix tablet). 30 tablet 3  . TANZEUM 50 MG PEN INJECT 50MG  INTO THE SKIN ONCE A WEEK 4 each 3  . TOUJEO SOLOSTAR 300 UNIT/ML SOPN INJECT 120 UNITS INTO THE SKIN  AT BEDTIME 13.5 mL 1  . UNIFINE PENTIPS 29G X 12MM MISC USE AS DIRECTED TWICE DAILY 100 each 5  . NITROSTAT 0.4 MG SL tablet Place 1 tablet (0.4 mg total) under the tongue as needed. (Patient not taking: Reported on 10/05/2016) 25 tablet 3   No current facility-administered medications for this visit.     Allergies  Allergen Reactions  .  Coreg [Carvedilol] Other (See Comments)    Fatigue- unclear if this was due to coreg specifically or beta blockers in general.    . Metformin And Related Nausea Only  . Rosuvastatin Calcium     REACTION: JOINT ACHES  . Statins     REACTION: JOINT ACHES    Review of Systems   Weight stable Diabetes well-controlled No recent hospitalizations or trip to the ED  BP (!) 126/56 (BP Location: Left Arm, Patient Position: Sitting, Cuff Size: Large)   Pulse 65   Resp 16   Ht 5\' 9"  (1.753 m)   Wt 273 lb (123.8 kg)   SpO2 97% Comment: ON RA  BMI 40.32 kg/m  Physical Exam      Exam    General- alert and comfortable   Lungs- clear without rales, wheezes   Cor- regular rate and rhythm, no murmur , gallop   Abdomen- soft, non-tender   Extremities - warm, non-tender, minimal edema   Neuro- oriented, appropriate, no focal weakness   Diagnostic Tests: None  Impression: Continues to do well with medical therapy of his CAD  Plan: Return for reassessment in 6 months   Len Childs, MD [facility] [phone]

## 2016-11-20 ENCOUNTER — Ambulatory Visit (INDEPENDENT_AMBULATORY_CARE_PROVIDER_SITE_OTHER): Payer: Medicare Other | Admitting: Internal Medicine

## 2016-11-20 ENCOUNTER — Encounter: Payer: Self-pay | Admitting: Internal Medicine

## 2016-11-20 VITALS — BP 124/82 | HR 67 | Wt 271.0 lb

## 2016-11-20 DIAGNOSIS — I2510 Atherosclerotic heart disease of native coronary artery without angina pectoris: Secondary | ICD-10-CM | POA: Diagnosis not present

## 2016-11-20 DIAGNOSIS — E1159 Type 2 diabetes mellitus with other circulatory complications: Secondary | ICD-10-CM | POA: Diagnosis not present

## 2016-11-20 DIAGNOSIS — E1165 Type 2 diabetes mellitus with hyperglycemia: Secondary | ICD-10-CM

## 2016-11-20 LAB — BASIC METABOLIC PANEL WITH GFR
BUN: 22 mg/dL (ref 7–25)
CO2: 23 mmol/L (ref 20–31)
Calcium: 9.2 mg/dL (ref 8.6–10.3)
Chloride: 102 mmol/L (ref 98–110)
Creat: 1.09 mg/dL (ref 0.70–1.25)
GFR, Est African American: 82 mL/min (ref >=60)
GFR, Est Non African American: 71 mL/min (ref >=60)
Glucose, Bld: 217 mg/dL — ABNORMAL HIGH (ref 65–99)
Potassium: 4.6 mmol/L (ref 3.5–5.3)
Sodium: 137 mmol/L (ref 135–146)

## 2016-11-20 LAB — POCT GLYCOSYLATED HEMOGLOBIN (HGB A1C): Hemoglobin A1C: 6.8

## 2016-11-20 NOTE — Patient Instructions (Addendum)
Please decrease: - InvokaMet ER to 150-500 mg once a day with b'fast.  Add: - Invokana 300 mg - take 1/2 a tablet (150 mg) with b'fast  Please continue: - Tanzeum 50 mg weekly - Novolog 40 units 3x a day - 15 min before meals - Toujeo 120 units (60 x2) - move this after dinner  Please stop at the lab.  Please return in 3 months with your sugar log.

## 2016-11-20 NOTE — Addendum Note (Signed)
Addended by: Caprice Beaver T on: 11/20/2016 10:37 AM   Modules accepted: Orders

## 2016-11-20 NOTE — Progress Notes (Addendum)
Patient ID: Phillip Clements, male   DOB: 06-Feb-1951, 66 y.o.   MRN: [id number]  HPI: Phillip Clements is a 66 y.o.-year-old male, returning for f/u for DM2, dx in 1996, insulin-dependent since 2008, uncontrolled, with complications (CAD - s/p stents, CABG, cerebro-vascular ds - s/p CVA, PVD). Last visit 3 mo ago. He changed to [facility] in 10/2016.  Last hemoglobin A1c was: Lab Results  Component Value Date   HGBA1C 8.1 08/20/2016   HGBA1C 7.2 05/18/2016   HGBA1C 7.3 02/16/2016   Pt is on a regimen of: - Tanzeum 50 mg weekly - Novolog 40 units 3x a day - 15 min before meals - Toujeo 120 units (60 x2) at bedtime - Invokamet XR 2 tablets in am with b'fast >> "terrible gas" He tried Victoza and Januvia. Had GI intolerance (nausea, diarrhea) to regular Metformin.  Pt checks his sugars 1-3x a day and they are better, except when forgetting Toujeo: - am: after workout: 103-214, 235 >> 127, 153-272 >> 104-208, 220 (forgot insulin) - 2h after b'fast: n/c - before lunch: 80-90 after golf, 122-230 >> 95-193, 228 >> 117-282 >> 84-158, 173 - 2h after lunch: n/c - before dinner: 136 >> 120s >> 101-219 >> 124-163, 205 >> 133, 152-303 >> 132, 143 - 2h after dinner: n/c - bedtime: 198, 254 >> 120 >> 307 >> n/c >> 166, 244-376 >> 160 - nighttime: n/c No lows. Lowest sugar was 60s >> 127 >> 84; he has hypoglycemia awareness at 70s.  Highest sugar was 200s >> 300s >> 220.  Glucometer: OneTouch Ultra mini  Pt's meals are plant based for last 1 year: - Breakfast: thin bagel + cream cheese + special K cinnamon cereal - Lunch: sandwich, chips, cottage cheese, pineapple - Dinner: 2 veggies, salads; Sat night: meat - Snacks: 2/day: celery + PB; low salt triscuit + laughing cow cheese  - no CKD, last BUN/creatinine:  Lab Results  Component Value Date   BUN 20 07/26/2015   CREATININE 0.89 07/26/2015  On Losartan. - last set of lipids: Lab Results  Component Value Date   CHOL 97 (L) 09/24/2016    HDL 38 (L) 09/24/2016   LDLCALC 37 09/24/2016   LDLDIRECT 157.1 09/07/2013   TRIG 111 09/24/2016   CHOLHDL 2.6 09/24/2016  Statins >> joint pain. On Repatha q 2 weeks On Zetia also. (Dr. Fletcher Anon) - last eye exam was in 06/2016. [facility]. No DR. Had cataract sx. - no numbness and tingling in his feet.  ROS: Constitutional: no weight gain/loss, no fatigue, no subjective hyperthermia/hypothermia Eyes: no blurry vision, no xerophthalmia ENT: no sore throat, no nodules palpated in throat, no dysphagia/odynophagia, no hoarseness Cardiovascular: no CP/SOB/palpitations/leg swelling Respiratory: no cough/SOB Gastrointestinal: no N/V/D/C Musculoskeletal: no muscle/joint aches Skin: no rashes Neurological: no tremors/numbness/tingling/dizziness  I reviewed pt's medications, allergies, PMH, social hx, family hx, and changes were documented in the history of present illness. Otherwise, unchanged from my initial visit note:  Past Medical History:  Diagnosis Date  . Coronary artery disease    a. CABG 04/2014: (LIMA--> LAD, SVG --> DIAG, SVG--> OM1, SVG--> PDA)  b. early graft failure. s/p successful rotational atherectomy and stenting of the left main and proximal LAD with DES. DES of mid LAD.(07/22/14)  . CVA (cerebral infarction)   . Hyperlipidemia   . Hypertension 2000  . OSA on CPAP   . Peripheral vascular disease ([place])    left SFA angioplasty in December 2016  . Prostate cancer ([place])   .  Prostate cancer ([place])   . Prostate cancer ([place])   . Prostate cancer ([place])   . Stroke ([place])   . Type II diabetes mellitus ([place])    Past Surgical History:  Procedure Laterality Date  . ANTERIOR CERVICAL DECOMP/DISCECTOMY FUSION  08/25/2012   Procedure: ANTERIOR CERVICAL DECOMPRESSION/DISCECTOMY FUSION 2 LEVELS;  Surgeon: Hosie Spangle, MD;  Location: [facility];  Service: Neurosurgery;  Laterality: N/A;  Cervical five-six Cervical six-seven anterior cervical decompression with fusion and  plating and bonegraft  . BACK SURGERY    . CARDIAC CATHETERIZATION  04/2014; 07/19/2014  . CORONARY ARTERY BYPASS GRAFT N/A 04/26/2014   Procedure: CORONARY ARTERY BYPASS GRAFTING (CABG), on pump, times four, using left internal mammary artery, right greater saphenous vein harvested endoscopically.;  Surgeon: Ivin Poot, MD;  Location: [facility];  Service: Open Heart Surgery;  Laterality: N/A;  LIMA to LAD, SVG to DIAGONAL, SVG to OM1, SVG to PDA) with EVH of the RIGHT THIGH and LOWER EXTREMITY SAPHENOUS VEIN  . Cytoscopy prostatic stone o/w nml  06/21/08   Dr. Jacqlyn Larsen  . ETT myoview  09/13/09   Low risk EF 49%  . High intense focused ultrasound  07/20/06   By Dr. Jacqlyn Larsen  . INTRAOPERATIVE TRANSESOPHAGEAL ECHOCARDIOGRAM N/A 04/26/2014   Procedure: INTRAOPERATIVE TRANSESOPHAGEAL ECHOCARDIOGRAM;  Surgeon: Ivin Poot, MD;  Location: [facility];  Service: Open Heart Surgery;  Laterality: N/A;  . PERCUTANEOUS CORONARY ROTOBLATOR INTERVENTION (PCI-R) N/A 07/22/2014   Procedure: PERCUTANEOUS CORONARY ROTOBLATOR INTERVENTION (PCI-R);  Surgeon: Peter M Martinique, MD;  Location: [facility] CATH LAB;  Service: Cardiovascular;  Laterality: N/A;  . PERIPHERAL VASCULAR CATHETERIZATION Left 07/26/2015   Procedure: Lower Extremity Angiography;  Surgeon: Katha Cabal, MD;  Location: [facility] CV LAB;  Service: Cardiovascular;  Laterality: Left;  . PERIPHERAL VASCULAR CATHETERIZATION  07/26/2015   Procedure: Lower Extremity Intervention;  Surgeon: Katha Cabal, MD;  Location: [facility] CV LAB;  Service: Cardiovascular;;  . PROSTATE BIOPSY  04/03/06 & 02/25/07   Social History   Social History  . Marital status: Married    Spouse name: N/A  . Number of children: 3  . Years of education: N/A   Occupational History  . Insurance account manager    Social History Main Topics  . Smoking status: Former Smoker    Packs/day: 1.00    Years: 36.00    Types: Cigarettes    Quit date: 03/01/2014   . Smokeless tobacco: Never Used  . Alcohol use Yes     Comment: 07/19/2014 "might have a drink a couple times/yr"  . Drug use: No  . Sexual activity: Not Currently   Other Topics Concern  . Not on file   Social History Narrative   Lives with wife.   2 daughters and 1 son.   [employer]   Former offensive lineman.    Current Outpatient Prescriptions on File Prior to Visit  Medication Sig Dispense Refill  . aspirin EC 81 MG tablet Take 1 tablet (81 mg total) by mouth daily. 90 tablet 3  . B Complex Vitamins (VITAMIN B COMPLEX PO) Take by mouth.    . B-D ULTRAFINE III SHORT PEN 31G X 8 MM MISC USE AS DIRECTED TWICE DAILY 100 each 11  . bicalutamide (CASODEX) 50 MG tablet Take 50 mg by mouth daily.    . carvedilol (COREG) 3.125 MG tablet TAKE 1 TABLET BY MOUTH TWICE A DAY WITH A MEAL. [place]  tablet 3  . clopidogrel (PLAVIX) 75 MG tablet Take 1 tablet (75 mg total) by mouth daily. 30 tablet 6  . Evolocumab (REPATHA SURECLICK) 856 MG/ML SOAJ Inject 1 pen into the skin every 14 (fourteen) days. 6 pen 3  . ezetimibe (ZETIA) 10 MG tablet TAKE 1 TABLET BY MOUTH ONCE DAILY 30 tablet 4  . furosemide (LASIX) 40 MG tablet Take 1 tablet (40 mg total) by mouth daily as needed for fluid or edema. 30 tablet 3  . glucose blood (ONE TOUCH ULTRA TEST) test strip USE AS DIRECTED TO TEST BLOOD SUGARS TWICE DAILY.  Diagnosis:  E11.59   Insulin-dependent. 200 each 3  . INVOKAMET XR 150-500 MG TB24 Take 2 tablets by mouth daily with breakfast. 180 tablet 3  . leuprolide (LUPRON) 30 MG injection Inject 45 mg into the muscle every 6 (six) months.    Marland Kitchen losartan (COZAAR) 25 MG tablet Take 0.5 tablets (12.5 mg total) by mouth daily. 30 tablet 6  . NITROSTAT 0.4 MG SL tablet Place 1 tablet (0.4 mg total) under the tongue as needed. 25 tablet 3  . NOVOLOG FLEXPEN 100 UNIT/ML FlexPen INJECT 40 UNITS INTO THE SKIN 3 TIMES DAILY WITH MEALS 30 mL 2  . potassium chloride SA (K-DUR,KLOR-CON) 20 MEQ  tablet Take 1 tablet (20 mEq total) by mouth daily as needed (Take one tabley if you take a Lasix tablet). 30 tablet 3  . TANZEUM 50 MG PEN INJECT 50MG  INTO THE SKIN ONCE A WEEK 4 each 3  . TOUJEO SOLOSTAR 300 UNIT/ML SOPN INJECT 120 UNITS INTO THE SKIN AT BEDTIME 13.5 mL 1  . UNIFINE PENTIPS 29G X 12MM MISC USE AS DIRECTED TWICE DAILY 100 each 5   No current facility-administered medications on file prior to visit.    Allergies  Allergen Reactions  . Coreg [Carvedilol] Other (See Comments)    Fatigue- unclear if this was due to coreg specifically or beta blockers in general.    . Metformin And Related Nausea Only  . Rosuvastatin Calcium     REACTION: JOINT ACHES  . Statins     REACTION: JOINT ACHES   Family History  Problem Relation Age of Onset  . Diabetes Mother 58    DM  . Coronary artery disease Mother   . Hypertension Mother   . Heart attack Mother     CABG with MI in 2005  . Heart disease Mother     CV  . Hypertension Father   . Heart attack Father     CABG with Mi around 1997  . Coronary artery disease Father   . Heart disease Father     CV  . Diabetes Father   . Heart attack Brother     MI/PTCA  . Heart disease Brother     CV  . Diabetes Brother   . Heart attack Maternal Grandfather     MI  . Prostate cancer Paternal Grandfather   . Diabetes Sister     DM  . Breast cancer Neg Hx     Breast/ovarian/uterine cancer  . Colon cancer Neg Hx   . Depression Neg Hx   . Alcohol abuse Neg Hx     ETOH/drug abuse  . Stroke Neg Hx    PE: BP 124/82 (BP Location: Left Arm, Patient Position: Sitting)   Pulse 67   Wt 271 lb (122.9 kg)   SpO2 97%   BMI 40.02 kg/m  Body mass index is 40.02 kg/m. Wt Readings from  Last 3 Encounters:  11/20/16 271 lb (122.9 kg)  11/14/16 273 lb (123.8 kg)  10/05/16 272 lb (123.4 kg)   Constitutional: obese, in NAD Eyes: PERRLA, EOMI, no exophthalmos ENT: moist mucous membranes, no thyromegaly, no cervical  lymphadenopathy Cardiovascular: RRR, No MRG Respiratory: CTA B Gastrointestinal: abdomen protuberant, soft, NT, ND, BS+ Musculoskeletal: no deformities, strength intact in all 4 Skin: moist, warm, + Stasis dermatitis rash on bilateral shins Neurological: no tremor with outstretched hands, DTR normal in all 4  ASSESSMENT: 1. DM2, insulin-dependent, uncontrolled, with complications - CAD - s/p stents, CABG - cerebro-vascular ds - s/p CVA  - PVD  PLAN:  1. Patient with long-standing, uncontrolled diabetes, with poor control on basal - bolus insulin regimen + GLP1 R agonist and SGLT2 inhibitor, worsening after the Holidays, but now with much better control after adding Metformin ER in the form of Glumetza (part of InvokaMet ER). However, he developed flatulence and eructation from the metformin. This is so bothersome that he would like to stop. For now, I suggested to decrease the dose of Invokamet just 1 tablet a day and supplement with half a tablet of his iNVOKANA. If he still continues to have symptoms, we'll need to stop iNVOKANA completely and just continue with iNVOKANA. He will let me know. - At last visits, we discussed about U500 insulin >> we may need to use this in the near future, but not needed for now - I suggested to:  Patient Instructions  Please decrease: - InvokaMet ER to 150-500 mg once a day with b'fast.  Add: - Invokana 300 mg - take 1/2 a tablet (150 mg) with b'fast  Please continue: - Tanzeum 50 mg weekly - Novolog 40 units 3x a day - 15 min before meals - Toujeo 120 units (60 x2) - move this after dinner  Please stop at the lab.  Please return in 3 months with your sugar log.   - continue checking sugars at different times of the day - check 3 times a day, rotating checks - advised for yearly eye exams >> he is UTD - given flu shot this season - Will check a BMP today - will check HbA1c today >> 6.8% (great improvement!) - Return to clinic in 3 mo with  sugar log   Component     Latest Ref Rng & Units 11/20/2016          Sodium     135 - 146 mmol/L 137  Potassium     3.5 - 5.3 mmol/L 4.6  Chloride     98 - 110 mmol/L 102  CO2     20 - 31 mmol/L 23  Glucose     65 - 99 mg/dL 217 (H)  BUN     7 - 25 mg/dL 22  Creatinine     0.70 - 1.25 mg/dL 1.09  Calcium     8.6 - 10.3 mg/dL 9.2  GFR, Est African American     >=60 mL/min 82  GFR, Est Non African American     >=60 mL/min 71  Hemoglobin A1C      6.8   Philemon Kingdom, MD PhD [facility]

## 2016-12-27 ENCOUNTER — Telehealth: Payer: Self-pay | Admitting: Pharmacist

## 2016-12-27 ENCOUNTER — Other Ambulatory Visit: Payer: Self-pay

## 2016-12-27 MED ORDER — DULAGLUTIDE 1.5 MG/0.5ML ~~LOC~~ SOAJ: SUBCUTANEOUS | 2 refills | Status: DC

## 2016-12-27 NOTE — Telephone Encounter (Signed)
Thanks Megan!

## 2016-12-27 NOTE — Telephone Encounter (Signed)
Pt has switched to [facility] so cost of his Repatha has increased to $300-400 per month. He does not meet income qualifications for [employer] to receive additional copay assistance. Discussed potentially enrolling in clinical trials at [facility] but pt states he has active cancer which is an exclusion for both trials. Unfortunately do not have other lipid lowering options at this time given previous statin intolerances. Pt instructed to continue with Zetia therapy. Will forward to Dr Fletcher Anon as an [place].

## 2016-12-27 NOTE — Telephone Encounter (Signed)
Changed from Tanzeum to Trulicity, per Dr.Gherghe received notification that Tanzeum was no longer covered.

## 2017-01-11 ENCOUNTER — Other Ambulatory Visit: Payer: Self-pay | Admitting: Family Medicine

## 2017-01-11 ENCOUNTER — Other Ambulatory Visit: Payer: Self-pay | Admitting: Cardiovascular Disease

## 2017-01-11 DIAGNOSIS — I10 Essential (primary) hypertension: Secondary | ICD-10-CM

## 2017-01-11 DIAGNOSIS — I2510 Atherosclerotic heart disease of native coronary artery without angina pectoris: Secondary | ICD-10-CM

## 2017-01-11 DIAGNOSIS — R079 Chest pain, unspecified: Secondary | ICD-10-CM

## 2017-01-11 DIAGNOSIS — E785 Hyperlipidemia, unspecified: Secondary | ICD-10-CM

## 2017-01-24 ENCOUNTER — Telehealth: Payer: Self-pay | Admitting: Internal Medicine

## 2017-01-24 ENCOUNTER — Other Ambulatory Visit: Payer: Self-pay

## 2017-01-24 MED ORDER — CANAGLIFLOZIN 300 MG PO TABS: ORAL_TABLET | ORAL | 2 refills | Status: DC

## 2017-01-24 NOTE — Telephone Encounter (Signed)
Submitted to pharmacy per note from [place] on dose and how to take it.

## 2017-01-24 NOTE — Telephone Encounter (Signed)
**  Remind patient they can make refill requests via MyChart**  Medication refill request (Name & Dosage): Invokana    Preferred pharmacy (Name & Address): [name], [address] [place] [phone] (Phone) [phone] (Fax)       Other comments (if applicable):   Patient states he was on Invokana and it was switched to the Invokamet, but was discussed to switch back to [place].

## 2017-02-04 ENCOUNTER — Other Ambulatory Visit: Payer: Self-pay | Admitting: Internal Medicine

## 2017-02-05 DIAGNOSIS — C61 Malignant neoplasm of prostate: Secondary | ICD-10-CM | POA: Diagnosis not present

## 2017-02-05 DIAGNOSIS — C7951 Secondary malignant neoplasm of bone: Secondary | ICD-10-CM | POA: Diagnosis not present

## 2017-02-05 DIAGNOSIS — Z6838 Body mass index (BMI) 38.0-38.9, adult: Secondary | ICD-10-CM | POA: Diagnosis not present

## 2017-02-05 DIAGNOSIS — N320 Bladder-neck obstruction: Secondary | ICD-10-CM | POA: Diagnosis not present

## 2017-02-20 ENCOUNTER — Other Ambulatory Visit: Payer: Self-pay

## 2017-02-20 MED ORDER — GLUCOSE BLOOD VI STRP: ORAL_STRIP | 3 refills | Status: DC

## 2017-02-20 NOTE — Telephone Encounter (Signed)
Chris at [facility] left v/m;[facility] is not able to bill for test strips under Medicare B. Chris request test strips to [facility]. Done.

## 2017-03-07 ENCOUNTER — Encounter: Payer: Self-pay | Admitting: Internal Medicine

## 2017-03-07 ENCOUNTER — Ambulatory Visit (INDEPENDENT_AMBULATORY_CARE_PROVIDER_SITE_OTHER): Payer: Medicare Other | Admitting: Internal Medicine

## 2017-03-07 VITALS — BP 132/80 | HR 66 | Ht 69.5 in | Wt 271.0 lb

## 2017-03-07 DIAGNOSIS — E1165 Type 2 diabetes mellitus with hyperglycemia: Secondary | ICD-10-CM

## 2017-03-07 DIAGNOSIS — E785 Hyperlipidemia, unspecified: Secondary | ICD-10-CM | POA: Diagnosis not present

## 2017-03-07 DIAGNOSIS — E1159 Type 2 diabetes mellitus with other circulatory complications: Secondary | ICD-10-CM

## 2017-03-07 DIAGNOSIS — I2510 Atherosclerotic heart disease of native coronary artery without angina pectoris: Secondary | ICD-10-CM

## 2017-03-07 LAB — POCT GLYCOSYLATED HEMOGLOBIN (HGB A1C): Hemoglobin A1C: 7.8

## 2017-03-07 MED ORDER — CANAGLIFLOZIN 300 MG PO TABS: ORAL_TABLET | ORAL | 11 refills | Status: DC

## 2017-03-07 MED ORDER — DULAGLUTIDE 1.5 MG/0.5ML ~~LOC~~ SOAJ: SUBCUTANEOUS | 11 refills | Status: DC

## 2017-03-07 MED ORDER — METFORMIN HCL ER 500 MG PO TB24: 1000.0000 mg | ORAL_TABLET | Freq: Every day | ORAL | 11 refills | Status: DC

## 2017-03-07 NOTE — Patient Instructions (Addendum)
Please start: - Metformin ER 500 mg once a day with dinner, but increase to 2 tablets with dinner if tolerated  Increase: - Invokana to 300 mg before b'fast  Change: - Tanzeum to trulicity 1.5 mg weekly  Please continue: - Novolog 40 units 3x a day - 15 min before meals - Toujeo 120 units (60 x2) aftyer dinner  Please return in 3 months with your sugar log.

## 2017-03-07 NOTE — Progress Notes (Signed)
Patient ID: Phillip Clements, male   DOB: 1951-07-22, 66 y.o.   MRN: [id number]  HPI: Phillip Clements is a 66 y.o.-year-old male, returning for f/u for DM2, dx in 1996, insulin-dependent since 2008, uncontrolled, with complications (CAD - s/p stents, CABG, cerebro-vascular ds - s/p CVA, PVD). Last visit 3.5 months ago. He changed to [facility] in 10/2016.  Last hemoglobin A1c was: Lab Results  Component Value Date   HGBA1C 6.8 11/20/2016   HGBA1C 8.1 08/20/2016   HGBA1C 7.2 05/18/2016   Pt is on a regimen of: - Tanzeum 50 mg weekly - Novolog 40 units 3x a day - 15 min before meals - Toujeo 120 units (60 x2) at bedtime - Invokamet XR 150-500 mg with b'fast (+ gas) - Invokana 150 mg with b'fast He tried Victoza and Januvia. Had GI intolerance (nausea, diarrhea) to regular Metformin.  Pt checks his sugars 1-3x a day: - am: after workout:  127, 153-272 >> 104-208, 220 (forgot insulin) >> 146, 170-244 - 2h after b'fast: n/c - before lunch:  95-193, 228 >> 117-282 >> 84-158, 173 >> 74, 159, 180-235 - 2h after lunch: n/c - before dinner: 124-163, 205 >> 133, 152-303 >> 132, 143 >> 115, 148 - 2h after dinner: n/c - bedtime: 198, 254 >> 120 >> 307 >> n/c >> 166, 244-376 >> 160 >> n/c - nighttime: n/c No lows. Lowest sugar was 60s >> 127 >> 84 >> 74; he has hypoglycemia awareness at 70s.  Highest sugar was 200s >> 300s >> 220 >> 244.  Glucometer: OneTouch Ultra mini  Pt's meals are plant based for last 1 year: - Breakfast: thin bagel + cream cheese + special K cinnamon cereal - Lunch: sandwich, chips, cottage cheese, pineapple - Dinner: 2 veggies, salads; Sat night: meat - Snacks: 2/day: celery + PB; low salt triscuit + laughing cow cheese  - + Mild CKD, last BUN/creatinine:  Lab Results  Component Value Date   BUN 22 11/20/2016   CREATININE 1.09 11/20/2016  On losartan. - last set of lipids: Lab Results  Component Value Date   CHOL 97 (L) 09/24/2016   HDL 38 (L) 09/24/2016   LDLCALC 37 09/24/2016   LDLDIRECT 157.1 09/07/2013   TRIG 111 09/24/2016   CHOLHDL 2.6 09/24/2016  He had joint pain from statins in the past. He was on Repatha q 2 weeks - now 65 and this is not covered by his insurance.  On Zetia. - last eye exam was in 06/2016. [facility]. No DR. Had cataract sx. - he denies numbness and tingling in his feet.  ROS: Constitutional: no weight gain/no weight loss, no fatigue, no subjective hyperthermia, no subjective hypothermia, + nocturia Eyes: no blurry vision, no xerophthalmia ENT: no sore throat, no nodules palpated in throat, no dysphagia, no odynophagia, no hoarseness Cardiovascular: no CP/no SOB/no palpitations/no leg swelling Respiratory: no cough/no SOB/no wheezing Gastrointestinal: no N/no V/no D/no C/no acid reflux Musculoskeletal: no muscle aches/no joint aches Skin: no rashes, no hair loss Neurological: no tremors/no numbness/no tingling/no dizziness, + HA  I reviewed pt's medications, allergies, PMH, social hx, family hx, and changes were documented in the history of present illness. Otherwise, unchanged from my initial visit note.  Past Medical History:  Diagnosis Date  . Coronary artery disease    a. CABG 04/2014: (LIMA--> LAD, SVG --> DIAG, SVG--> OM1, SVG--> PDA)  b. early graft failure. s/p successful rotational atherectomy and stenting of the left main and proximal LAD with DES. DES  of mid LAD.(07/22/14)  . CVA (cerebral infarction)   . Hyperlipidemia   . Hypertension 2000  . OSA on CPAP   . Peripheral vascular disease ([place])    left SFA angioplasty in December 2016  . Prostate cancer ([place])   . Prostate cancer ([place])   . Prostate cancer ([place])   . Prostate cancer ([place])   . Stroke ([place])   . Type II diabetes mellitus ([place])    Past Surgical History:  Procedure Laterality Date  . ANTERIOR CERVICAL DECOMP/DISCECTOMY FUSION  08/25/2012   Procedure: ANTERIOR CERVICAL DECOMPRESSION/DISCECTOMY FUSION 2 LEVELS;  Surgeon: Hosie Spangle, MD;  Location: [place] [facility];  Service: Neurosurgery;  Laterality: N/A;  Cervical five-six Cervical six-seven anterior cervical decompression with fusion and plating and bonegraft  . BACK SURGERY    . CARDIAC CATHETERIZATION  04/2014; 07/19/2014  . CORONARY ARTERY BYPASS GRAFT N/A 04/26/2014   Procedure: CORONARY ARTERY BYPASS GRAFTING (CABG), on pump, times four, using left internal mammary artery, right greater saphenous vein harvested endoscopically.;  Surgeon: Ivin Poot, MD;  Location: [place];  Service: Open Heart Surgery;  Laterality: N/A;  LIMA to LAD, SVG to DIAGONAL, SVG to OM1, SVG to PDA) with EVH of the RIGHT THIGH and LOWER EXTREMITY SAPHENOUS VEIN  . Cytoscopy prostatic stone o/w nml  06/21/08   Dr. Jacqlyn Larsen  . ETT myoview  09/13/09   Low risk EF 49%  . High intense focused ultrasound  07/20/06   By Dr. Jacqlyn Larsen  . INTRAOPERATIVE TRANSESOPHAGEAL ECHOCARDIOGRAM N/A 04/26/2014   Procedure: INTRAOPERATIVE TRANSESOPHAGEAL ECHOCARDIOGRAM;  Surgeon: Ivin Poot, MD;  Location: [place];  Service: Open Heart Surgery;  Laterality: N/A;  . PERCUTANEOUS CORONARY ROTOBLATOR INTERVENTION (PCI-R) N/A 07/22/2014   Procedure: PERCUTANEOUS CORONARY ROTOBLATOR INTERVENTION (PCI-R);  Surgeon: Peter M Martinique, MD;  Location: [facility] CATH LAB;  Service: Cardiovascular;  Laterality: N/A;  . PERIPHERAL VASCULAR CATHETERIZATION Left 07/26/2015   Procedure: Lower Extremity Angiography;  Surgeon: Katha Cabal, MD;  Location: [facility] CV LAB;  Service: Cardiovascular;  Laterality: Left;  . PERIPHERAL VASCULAR CATHETERIZATION  07/26/2015   Procedure: Lower Extremity Intervention;  Surgeon: Katha Cabal, MD;  Location: [place] CV LAB;  Service: Cardiovascular;;  . PROSTATE BIOPSY  04/03/06 & 02/25/07   Social History   Social History  . Marital status: Married    Spouse name: N/A  . Number of children: 3  . Years of education: N/A   Occupational History  . Physiological scientist    Social History Main Topics  . Smoking status: Former Smoker    Packs/day: 1.00    Years: 36.00    Types: Cigarettes    Quit date: 03/01/2014  . Smokeless tobacco: Never Used  . Alcohol use Yes     Comment: 07/19/2014 "might have a drink a couple times/yr"  . Drug use: No  . Sexual activity: Not Currently   Other Topics Concern  . Not on file   Social History Narrative   Lives with wife.   2 daughters and 1 son.   [employer]   Former offensive lineman.    Current Outpatient Prescriptions on File Prior to Visit  Medication Sig Dispense Refill  . aspirin EC 81 MG tablet Take 1 tablet (81 mg total) by mouth daily. 90 tablet 3  . B Complex Vitamins (VITAMIN B COMPLEX PO) Take by mouth.    . B-D ULTRAFINE III SHORT PEN 31G  X 8 MM MISC USE AS DIRECTED TWICE DAILY 100 each 11  . bicalutamide (CASODEX) 50 MG tablet Take 50 mg by mouth daily.    . canagliflozin (INVOKANA) 300 MG TABS tablet Take 1/2 tablet with breakfast daily 30 tablet 2  . carvedilol (COREG) 3.125 MG tablet TAKE 1 TABLET BY MOUTH TWICE (2) DAILY WITH A MEAL 60 tablet 1  . clopidogrel (PLAVIX) 75 MG tablet Take 1 tablet (75 mg total) by mouth daily. 30 tablet 6  . ezetimibe (ZETIA) 10 MG tablet TAKE 1 TABLET BY MOUTH ONCE DAILY 30 tablet 4  . furosemide (LASIX) 40 MG tablet Take 1 tablet (40 mg total) by mouth daily as needed for fluid or edema. 30 tablet 3  . glucose blood (ONE TOUCH ULTRA TEST) test strip Check blood sugar twice a day and as directed Dx E11.59 200 each 3  . leuprolide (LUPRON) 30 MG injection Inject 45 mg into the muscle every 6 (six) months.    Marland Kitchen losartan (COZAAR) 25 MG tablet TAKE 1/2 TABLET BY MOUTH DAILY 30 tablet 1  . NITROSTAT 0.4 MG SL tablet Place 1 tablet (0.4 mg total) under the tongue as needed. 25 tablet 3  . NOVOLOG FLEXPEN 100 UNIT/ML FlexPen INJECT 40 UNITS INTO THE SKIN 3 TIMES DAILY WITH MEALS 30 mL 2  . potassium chloride SA  (K-DUR,KLOR-CON) 20 MEQ tablet Take 1 tablet (20 mEq total) by mouth daily as needed (Take one tabley if you take a Lasix tablet). 30 tablet 3  . TOUJEO SOLOSTAR 300 UNIT/ML SOPN INJECT 120 UNITS INTO THE SKIN AT BEDTIME 13.5 mL 1  . UNIFINE PENTIPS 29G X 12MM MISC USE AS DIRECTED TWICE DAILY 100 each 5  . TANZEUM 50 MG PEN INJECT 50MG  INTO THE SKIN ONCE A WEEK (Patient not taking: Reported on 03/07/2017) 4 each 3   No current facility-administered medications on file prior to visit.    Allergies  Allergen Reactions  . Coreg [Carvedilol] Other (See Comments)    Fatigue- unclear if this was due to coreg specifically or beta blockers in general.    . Metformin And Related Nausea Only  . Rosuvastatin Calcium     REACTION: JOINT ACHES  . Statins     REACTION: JOINT ACHES   Family History  Problem Relation Age of Onset  . Diabetes Mother 54       DM  . Coronary artery disease Mother   . Hypertension Mother   . Heart attack Mother        CABG with MI in 2005  . Heart disease Mother        CV  . Hypertension Father   . Heart attack Father        CABG with Mi around 1997  . Coronary artery disease Father   . Heart disease Father        CV  . Diabetes Father   . Heart attack Brother        MI/PTCA  . Heart disease Brother        CV  . Diabetes Brother   . Heart attack Maternal Grandfather        MI  . Prostate cancer Paternal Grandfather   . Diabetes Sister        DM  . Breast cancer Neg Hx        Breast/ovarian/uterine cancer  . Colon cancer Neg Hx   . Depression Neg Hx   . Alcohol abuse Neg Hx  ETOH/drug abuse  . Stroke Neg Hx    PE: BP 132/80 (BP Location: Left Arm, Patient Position: Sitting)   Pulse 66   Ht 5' 9.5" (1.765 m)   Wt 271 lb (122.9 kg)   SpO2 98%   BMI 39.45 kg/m  Body mass index is 39.45 kg/m. Wt Readings from Last 3 Encounters:  03/07/17 271 lb (122.9 kg)  11/20/16 271 lb (122.9 kg)  11/14/16 273 lb (123.8 kg)   Constitutional:  overweight, in NAD Eyes: PERRLA, EOMI, no exophthalmos ENT: moist mucous membranes, no thyromegaly, no cervical lymphadenopathy Cardiovascular: RRR, No MRG Respiratory: CTA B Gastrointestinal: abdomen soft, NT, ND, BS+ Musculoskeletal: no deformities, strength intact in all 4 Skin: moist, warm, + Stasis dermatitis rash on bilateral shins Neurological: no tremor with outstretched hands, DTR normal in all 4  ASSESSMENT: 1. DM2, insulin-dependent, uncontrolled, with complications - CAD - s/p stents, CABG - cerebro-vascular ds - s/p CVA  - PVD  2. HL  PLAN:  1. Patient with long-standing, uncontrolled diabetes, on basal-bolus insulin regimen, GLP 1 receptor agonist, SGLT2 inhibitor, and now off metformin, returning with higher blood sugars, as he stopped Invokamet completely since last visit. He feels that taking the Invokamet in the morning gave him gas so I advised him to take only half of the tablet, but he in fact stopped it.  - At this visit, we discussed about retrying metformin extended-release 1 tablet at night to see if he tolerates it. If he does, will increase to 1000 mg daily -  we'll also increase iNVOKANA to the full tablet of 300 mg daily  - We need to change Tanzeum to Trulicity as he switched to Medicare and this is not covering Tanzeum anymore. Also, I believe the Tanzeum stopped being made.  - At last visits, we discussed about U500 insulin >> we may need to use this in the near future, but not needed for now - I suggested to:  Patient Instructions  Please start: - Metformin ER 500 mg once a day with dinner, but increase to 2 tablets with dinner if tolerated  Increase: - Invokana to 300 mg before b'fast  Change: - Tanzeum to trulicity 1.5 mg weekly  Please continue: - Novolog 40 units 3x a day - 15 min before meals - Toujeo 120 units (60 x2) aftyer dinner  Please return in 3 months with your sugar log.   - today, HbA1c is 7.8% (higher) - continue checking  sugars at different times of the day - check 3x a day, rotating checks - advised for yearly eye exams >> he is UTD - Return to clinic in 3 mo with sugar log   2. HL - Does not tolerate statins - He had to stop Repatha since this was not covered by insurance anymore -  he is now just on ezetimibe  Philemon Kingdom, MD PhD [facility] Endocrinology

## 2017-03-15 ENCOUNTER — Telehealth: Payer: Self-pay | Admitting: Internal Medicine

## 2017-03-15 NOTE — Telephone Encounter (Signed)
Patient called in reference to pharmacy needing PA for [place] 100 UNIT/ML FlexPen   [place], [place] [phone] (Phone) [phone] (Fax)   Please call pharmacy and advise.

## 2017-03-18 ENCOUNTER — Other Ambulatory Visit: Payer: Self-pay | Admitting: Internal Medicine

## 2017-03-18 ENCOUNTER — Other Ambulatory Visit: Payer: Self-pay

## 2017-03-18 MED ORDER — INSULIN LISPRO 100 UNIT/ML (KWIKPEN): PEN_INJECTOR | SUBCUTANEOUS | 1 refills | Status: DC

## 2017-03-18 NOTE — Telephone Encounter (Signed)
Patient needing a PA for the Novolog, okay to switch to Humalog or complete PA. Thank you!

## 2017-03-18 NOTE — Telephone Encounter (Signed)
Submitted the switch.

## 2017-03-18 NOTE — Telephone Encounter (Signed)
OK to switch 

## 2017-03-26 ENCOUNTER — Other Ambulatory Visit: Payer: Self-pay | Admitting: *Deleted

## 2017-03-26 MED ORDER — EZETIMIBE 10 MG PO TABS: 10.0000 mg | ORAL_TABLET | Freq: Every day | ORAL | 4 refills | Status: DC

## 2017-04-04 ENCOUNTER — Ambulatory Visit (INDEPENDENT_AMBULATORY_CARE_PROVIDER_SITE_OTHER): Payer: Medicare Other | Admitting: Family Medicine

## 2017-04-04 ENCOUNTER — Ambulatory Visit (INDEPENDENT_AMBULATORY_CARE_PROVIDER_SITE_OTHER)
Admission: RE | Admit: 2017-04-04 | Discharge: 2017-04-04 | Disposition: A | Discharge status: Home / Self Care | Payer: Medicare Other | Source: Ambulatory Visit | Attending: Family Medicine | Admitting: Family Medicine

## 2017-04-04 ENCOUNTER — Encounter: Payer: Self-pay | Admitting: Family Medicine

## 2017-04-04 VITALS — BP 128/72 | HR 70 | Temp 97.4°F | Wt 271.8 lb

## 2017-04-04 DIAGNOSIS — M545 Low back pain: Secondary | ICD-10-CM | POA: Diagnosis not present

## 2017-04-04 DIAGNOSIS — I2510 Atherosclerotic heart disease of native coronary artery without angina pectoris: Secondary | ICD-10-CM | POA: Diagnosis not present

## 2017-04-04 DIAGNOSIS — K921 Melena: Secondary | ICD-10-CM

## 2017-04-04 DIAGNOSIS — M47816 Spondylosis without myelopathy or radiculopathy, lumbar region: Secondary | ICD-10-CM | POA: Diagnosis not present

## 2017-04-04 LAB — CBC WITH DIFFERENTIAL/PLATELET
Basophils Absolute: 0 10*3/uL (ref 0.0–0.1)
Basophils Relative: 0.6 % (ref 0.0–3.0)
Eosinophils Absolute: 0.1 10*3/uL (ref 0.0–0.7)
Eosinophils Relative: 1.9 % (ref 0.0–5.0)
HCT: 41.2 % (ref 39.0–52.0)
Hemoglobin: 13.7 g/dL (ref 13.0–17.0)
Lymphocytes Relative: 30.3 % (ref 12.0–46.0)
Lymphs Abs: 2.3 10*3/uL (ref 0.7–4.0)
MCHC: 33.2 g/dL (ref 30.0–36.0)
MCV: 88.4 fl (ref 78.0–100.0)
Monocytes Absolute: 0.5 10*3/uL (ref 0.1–1.0)
Monocytes Relative: 7.2 % (ref 3.0–12.0)
Neutro Abs: 4.6 10*3/uL (ref 1.4–7.7)
Neutrophils Relative %: 60 % (ref 43.0–77.0)
Platelets: 263 10*3/uL (ref 150.0–400.0)
RBC: 4.66 Mil/uL (ref 4.22–5.81)
RDW: 13.9 % (ref 11.5–15.5)
WBC: 7.7 10*3/uL (ref 4.0–10.5)

## 2017-04-04 LAB — COMPREHENSIVE METABOLIC PANEL
ALT: 23 U/L (ref 0–53)
AST: 14 U/L (ref 0–37)
Albumin: 4.4 g/dL (ref 3.5–5.2)
Alkaline Phosphatase: 61 U/L (ref 39–117)
BUN: 23 mg/dL (ref 6–23)
CO2: 29 mEq/L (ref 19–32)
Calcium: 9.6 mg/dL (ref 8.4–10.5)
Chloride: 101 mEq/L (ref 96–112)
Creatinine, Ser: 0.99 mg/dL (ref 0.40–1.50)
GFR: 80.45 mL/min (ref >60.00)
Glucose, Bld: 181 mg/dL — ABNORMAL HIGH (ref 70–99)
Potassium: 4.5 mEq/L (ref 3.5–5.1)
Sodium: 136 mEq/L (ref 135–145)
Total Bilirubin: 0.7 mg/dL (ref 0.2–1.2)
Total Protein: 7.2 g/dL (ref 6.0–8.3)

## 2017-04-04 LAB — IBC PANEL
Iron: 76 ug/dL (ref 42–165)
Saturation Ratios: 16.5 % — ABNORMAL LOW (ref 20.0–50.0)
Transferrin: 329 mg/dL (ref 212.0–360.0)

## 2017-04-04 MED ORDER — INSULIN ASPART 100 UNIT/ML FLEXPEN: PEN_INJECTOR | SUBCUTANEOUS | 1 refills | Status: DC

## 2017-04-04 MED ORDER — METFORMIN HCL ER 500 MG PO TB24: 500.0000 mg | ORAL_TABLET | Freq: Every day | ORAL | Status: DC

## 2017-04-04 NOTE — Patient Instructions (Signed)
Go to the lab on the way out.  We'll contact you with your lab and xray report. We'll go from there.  Take care.  Glad to see you.

## 2017-04-04 NOTE — Progress Notes (Signed)
"  I'm getting old fast."  Leg problems with walking, aching.  H/o neuropathy at baseline.  Now with thigh and calf pain B with walking.  Only happens at rest if laying on the L side.  No sx sitting.  Started happening about 3 months ago.  Not escalating but not getting better.  Sig pain with walking up to 30 min.  He can walk for a few minutes prior to sx starting.  Sx get better with stopping and resting, but quickly and clearly better sitting down.  He can have similar sx with prolonged standing, that will get better with rest/sitting.    Headaches.  Frontal, occ on the R side of the face.  Rubbing the area helps.    "Stomach issues".  Black stools a few weeks ago. Normalized in the meantime.  Diffuse but not focal weakness and fatigue noted.  cologuard neg in 08/2016.    PSA still undetectable on recent check per urology. No FCANVD but occ hot flashes due to ongoing treatment.    PMH and SH reviewed  ROS: Per HPI unless specifically indicated in ROS section   Meds, vitals, and allergies reviewed.   nad ncat OP wnl, MMM Neck supple, no LA rrr ctab Abd soft, not ttp, no rebound Ext w/o edema Decreased but palpable DP pulses B Dec sensation B feet.   Calf not ttp B

## 2017-04-05 ENCOUNTER — Ambulatory Visit: Payer: Medicare Other | Admitting: Family Medicine

## 2017-04-05 ENCOUNTER — Encounter: Payer: Self-pay | Admitting: Physician Assistant

## 2017-04-05 ENCOUNTER — Other Ambulatory Visit: Payer: Self-pay | Admitting: Family Medicine

## 2017-04-05 DIAGNOSIS — M545 Low back pain, unspecified: Secondary | ICD-10-CM | POA: Insufficient documentation

## 2017-04-05 DIAGNOSIS — K921 Melena: Secondary | ICD-10-CM

## 2017-04-05 NOTE — Assessment & Plan Note (Signed)
Recent cologuard normal but the concern is for previous passed blood. Discussed with patient. Benign abdominal exam. See notes on labs. Okay for outpatient follow-up at this point. This may explain some of his fatigue.

## 2017-04-05 NOTE — Assessment & Plan Note (Signed)
Concern is for spinal stenosis. He has symptoms when he is standing, they get better immediately when he sits down. It is limiting his walking. Reasonable to recheck plain films today. He has no acute injury. He has chronic ongoing symptoms with standing. He has bilateral symptoms. I don't think this is vascular/claudication. His symptoms get dramatically better when he sits down and he still has intact pulses in the feet. Discussed with patient. See notes on imaging. >25 minutes spent in face to face time with patient, >50% spent in counselling or coordination of care.

## 2017-04-17 ENCOUNTER — Other Ambulatory Visit: Payer: Self-pay | Admitting: Cardiovascular Disease

## 2017-04-17 DIAGNOSIS — I2510 Atherosclerotic heart disease of native coronary artery without angina pectoris: Secondary | ICD-10-CM

## 2017-04-17 DIAGNOSIS — E785 Hyperlipidemia, unspecified: Secondary | ICD-10-CM

## 2017-04-17 DIAGNOSIS — R079 Chest pain, unspecified: Secondary | ICD-10-CM

## 2017-04-17 DIAGNOSIS — I10 Essential (primary) hypertension: Secondary | ICD-10-CM

## 2017-04-20 ENCOUNTER — Ambulatory Visit
Admission: RE | Admit: 2017-04-20 | Discharge: 2017-04-20 | Disposition: A | Discharge status: Home / Self Care | Payer: Medicare Other | Source: Ambulatory Visit | Attending: Family Medicine | Admitting: Family Medicine

## 2017-04-20 DIAGNOSIS — M48061 Spinal stenosis, lumbar region without neurogenic claudication: Secondary | ICD-10-CM | POA: Diagnosis not present

## 2017-04-20 DIAGNOSIS — M545 Low back pain: Secondary | ICD-10-CM

## 2017-04-22 ENCOUNTER — Telehealth: Payer: Self-pay | Admitting: *Deleted

## 2017-04-22 ENCOUNTER — Other Ambulatory Visit: Payer: Self-pay | Admitting: Family Medicine

## 2017-04-22 ENCOUNTER — Ambulatory Visit (INDEPENDENT_AMBULATORY_CARE_PROVIDER_SITE_OTHER): Payer: Medicare Other | Admitting: Physician Assistant

## 2017-04-22 ENCOUNTER — Encounter: Payer: Self-pay | Admitting: Physician Assistant

## 2017-04-22 VITALS — BP 118/64 | HR 84 | Ht 69.0 in | Wt 273.4 lb

## 2017-04-22 DIAGNOSIS — Z8719 Personal history of other diseases of the digestive system: Secondary | ICD-10-CM | POA: Diagnosis not present

## 2017-04-22 DIAGNOSIS — R143 Flatulence: Secondary | ICD-10-CM

## 2017-04-22 DIAGNOSIS — M48061 Spinal stenosis, lumbar region without neurogenic claudication: Secondary | ICD-10-CM

## 2017-04-22 DIAGNOSIS — Z7901 Long term (current) use of anticoagulants: Secondary | ICD-10-CM

## 2017-04-22 NOTE — Telephone Encounter (Signed)
04/22/2017   RE: Phillip Clements DOB: 03-04-1951 MRN: [id number]   Dear Dr. Wellington Hampshire,    We have scheduled the above patient for an endoscopic procedure. Our records show that he is on anticoagulation therapy.   Please advise as to how long the patient may come off his therapy of Plavix prior to the procedure, which is scheduled for 06-11-2017.  Please  route the Plavix clearance instructions to Marisue Humble CMA  Sincerely,  Amy Esterwood PA-C

## 2017-04-22 NOTE — Progress Notes (Addendum)
Subjective:    Patient ID: Phillip Clements, male    DOB: 18-Jul-1951, 66 y.o.   MRN: [id number]  HPI Phillip Clements is a pleasant 66 year old white male, new to GI today referred by Dr. Elsie Clements for evaluation of recent episode of melena. Patient has not had any prior GI evaluation He did do a Cologuard in January 2018 which was negative. Most recent hemoglobin 04/04/2017 was 13.7. Patient has history of peripheral vascular disease, coronary artery disease status post CABG, atrial fibrillation, history of CVA/TIA several years ago, adult-onset diabetes mellitus and prostate cancer. He says about a month ago he developed very black stools which occurred on a daily basis for about 1-1/2 weeks and then resolved. He had not been on any new medications vitamins Pepto-Bismol etc. He says he chronically has a lot of difficulty with gas but attributes some of that to his diabetic medications. He has no complaints of abdominal pain or discomfort. Appetite has been fine weight has been stable no heartburn indigestion or dysphagia. He has not noticed any changes in his bowel habits has bowel movement daily to every other day. Family history is negative for colon cancer as far he is aware. Last echo showed EF of 50-55%.   He is maintained on Plavix and aspirin, and followed by Dr. Fletcher Clements  Review of Systems Pertinent positive and negative review of systems were noted in the above HPI section.  All other review of systems was otherwise negative.  Outpatient Encounter Prescriptions as of 04/22/2017  Medication Sig  . aspirin EC 81 MG tablet Take 1 tablet (81 mg total) by mouth daily.  . B Complex Vitamins (VITAMIN B COMPLEX PO) Take by mouth.  . B-D ULTRAFINE III SHORT PEN 31G X 8 MM MISC USE AS DIRECTED TWICE DAILY  . bicalutamide (CASODEX) 50 MG tablet Take 50 mg by mouth daily.  . canagliflozin (INVOKANA) 300 MG TABS tablet Take 1 tablet with breakfast daily  . carvedilol (COREG) 3.125 MG tablet TAKE 1 TABLET  BY MOUTH TWICE (2) DAILY WITH A MEAL  . clopidogrel (PLAVIX) 75 MG tablet Take 1 tablet (75 mg total) by mouth daily.  . Dulaglutide (TRULICITY) 1.5 XN/1.7GY SOPN Inject 1.5 mg under skin weekly  . ezetimibe (ZETIA) 10 MG tablet Take 1 tablet (10 mg total) by mouth daily.  . furosemide (LASIX) 40 MG tablet Take 1 tablet (40 mg total) by mouth daily as needed for fluid or edema.  Marland Kitchen glucose blood (ONE TOUCH ULTRA TEST) test strip Check blood sugar twice a day and as directed Dx E11.59  . insulin aspart (NOVOLOG FLEXPEN) 100 UNIT/ML FlexPen INJECT 40 UNITS INTO THE SKIN 3 TIMES DAILY WITH MEALS  . leuprolide (LUPRON) 30 MG injection Inject 45 mg into the muscle every 6 (six) months.  Marland Kitchen losartan (COZAAR) 25 MG tablet TAKE 1/2 TABLET BY MOUTH DAILY  . metFORMIN (GLUCOPHAGE-XR) 500 MG 24 hr tablet Take 1 tablet (500 mg total) by mouth at bedtime.  Marland Kitchen NITROSTAT 0.4 MG SL tablet Place 1 tablet (0.4 mg total) under the tongue as needed.  . potassium chloride SA (K-DUR,KLOR-CON) 20 MEQ tablet Take 1 tablet (20 mEq total) by mouth daily as needed (Take one tabley if you take a Lasix tablet).  . TOUJEO SOLOSTAR 300 UNIT/ML SOPN INJECT 120 UNITS INTO THE SKIN AT BEDTIME  . UNIFINE PENTIPS 29G X 12MM MISC USE AS DIRECTED TWICE DAILY   No facility-administered encounter medications on file as of 04/22/2017.  Allergies  Allergen Reactions  . Coreg [Carvedilol] Other (See Comments)    Fatigue- unclear if this was due to coreg specifically or beta blockers in general.    . Metformin And Related Nausea Only  . Rosuvastatin Calcium     REACTION: JOINT ACHES  . Statins     REACTION: JOINT ACHES   Patient Active Problem List   Diagnosis Date Noted  . Black stools 04/05/2017  . Low back pain 04/05/2017  . Colon cancer screening 07/29/2016  . Dysphagia 07/29/2016  . Fatigue 07/29/2016  . Poorly controlled type 2 diabetes mellitus with circulatory disorder ([place]) 03/18/2015  . CVA (cerebral vascular  accident) ([place]) 07/23/2014  . CVA (cerebral infarction)   . OSA on CPAP   . Hypertension   . Coronary artery disease involving coronary bypass graft of native heart with unstable angina pectoris ([place])   . Acute coronary syndrome ([place]) 07/19/2014  . Postoperative atrial fibrillation ([place]) 05/23/2014  . Coronary artery disease   . Hyperlipidemia   . Angina, class III ([place]) 04/13/2014  . Syncope 04/13/2014  . Exertional chest pain 03/29/2014  . SK (seborrheic keratosis) 12/30/2013  . Caregiver burden 12/30/2013  . 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 Vertigo 04/28/2013  . Genuine stress incontinence, male 05/20/2012  . Disorder of bladder function 05/13/2012  . 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 Incomplete bladder emptying 05/13/2012  . Malignant neoplasm of prostate ([place]) 05/13/2012  . 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 Infection of urinary tract 05/13/2012  . Burning or prickling sensation 12/03/2011  . Elevated blood pressure reading without diagnosis of hypertension 07/30/2011  . Cerebrovascular accident (CVA) ([place]) 07/30/2011  . Arthritis, degenerative 07/30/2011  . Pure hypercholesterolemia 07/30/2011  . Presbyopia 07/30/2011  . Peripheral vascular disease ([place]) 10/03/2009  . DEGENERATIVE JOINT DISEASE 10/03/2009  . Artery disease, cerebral 07/18/2009  . Obesity 05/12/2009  . Benign hypertension 06/29/2008  . History of tobacco use 06/29/2008  . NICOTINE ADDICTION 09/16/2007  . Prostate cancer ([place]) 09/16/2007   Social History   Social History  . Marital status: Married    Spouse name: N/A  . Number of children: 3  . Years of education: N/A   Occupational History  . Insurance account manager    Social History Main Topics  . Smoking status: Former Smoker    Packs/day: 1.00    Years: 36.00    Types: Cigarettes    Quit date: 03/01/2014  . Smokeless tobacco: Never Used  . Alcohol use Yes      Comment: 07/19/2014 "might have a drink a couple times/yr"  . Drug use: No  . Sexual activity: Not Currently   Other Topics Concern  . Not on file   Social History Narrative   Lives with wife.   2 daughters and 1 son.   [employer]   Former offensive lineman.     Mr. Montesinos family history includes Coronary artery disease in his father and mother; Diabetes in his brother, father, and sister; Diabetes (age of onset: 31) in his mother; Heart attack in his brother, father, maternal grandfather, and mother; Heart disease in his brother, father, and mother; Hypertension in his father and mother; Prostate cancer in his paternal grandfather.      Objective:    Vitals:   04/22/17 0849  BP: 118/64  Pulse: 84    Physical Exam  well-developed older white male in no acute distress,  pleasant blood pressure 118/64 pulse 84, height 5 foot 9, weight 273, BMI 40.3. HEENT; nontraumatic normocephalic EOMI PERRLA sclera anicteric, Cardiovascular; regular rate and rhythm with S1-S2 he does have a sternal incisional scar, Pulmonary; clear bilaterally, Abdomen ;obese, soft nontender nondistended bowel sounds are active there is no palpable mass or hepatosplenomegaly, Rectal; exam not done, Extremities; no clubbing cyanosis or edema skin warm and dry, Neuropsych; mood and affect appropriate       Assessment & Plan:   #81 66 year old white male on chronic antiplatelet therapy with aspirin and Plavix with episodes of melena/black stool occurring about one month ago and lasting for week and since resolved. Patient's only GI complaint is gas which is chronic. Etiology of above symptoms, not clear, no prior GI evaluation will need to rule out upper GI source #2 history of prostate CA #3 coronary artery disease status post CABG #4 remote history CVA/TIA #5 history of atrial fibrillation #6 peripheral vascular disease #7 adult-onset diabetes mellitus #8 obesity  Plan; Patient will  be scheduled for colonoscopy and upper endoscopy with Dr. Carlean Purl. Both procedures were discussed in detail with the patient including indications, risks and benefits and he is agreeable to proceed. Patient will need to hold Plavix for 5 days prior to procedures. We will communicate with his cardiologist Dr. Fletcher Clements to assure that holding Plavix is reasonable in this patient. He will continue baby aspirin. Pt  was advised to call should he have any recurrent's of melena or more obvious bleeding in the antrum.  Abbigal Radich S Luisalberto Beegle PA-C 04/22/2017   Cc: Tonia Ghent, MD   Agree with Ms. Genia Harold assessment and plan. Gatha Mayer, MD, [facility]

## 2017-04-22 NOTE — Patient Instructions (Signed)
You have been scheduled for a colonoscopy and Endoscopy. Please follow written instructions given to you at your visit today.  Please pick up your prep supplies at the pharmacy within the next 1-3 days. If you use inhalers (even only as needed), please bring them with you on the day of your procedure. Your physician has requested that you go to www.[email or web address].com and enter the access code given to you at your visit today. This web site gives a general overview about your procedure. However, you should still follow specific instructions given to you by our office regarding your preparation for the procedure.  We will contact you when we get the Plavix clearance instructions from Dr. Fletcher Anon.   If you are age 4 or older, your body mass index should be between 23-30. Your Body mass index is 40.37 kg/m. If this is out of the aforementioned range listed, please consider follow up with your Primary Care Provider.

## 2017-04-25 NOTE — Telephone Encounter (Signed)
Plavix can be held 5 days before the procedure and should be resumed after.

## 2017-04-29 NOTE — Telephone Encounter (Signed)
Advised the patient  Dr. Fletcher Anon said he can be off the Plavix 5 days prior to the procedure date. He can resume the day after the colonoscopy/Endo. The patient verbalized understanding the instructions.

## 2017-05-02 ENCOUNTER — Telehealth: Payer: Self-pay | Admitting: Internal Medicine

## 2017-05-02 ENCOUNTER — Other Ambulatory Visit: Payer: Self-pay

## 2017-05-02 MED ORDER — INSULIN ASPART 100 UNIT/ML FLEXPEN: PEN_INJECTOR | SUBCUTANEOUS | 1 refills | Status: DC

## 2017-05-02 NOTE — Telephone Encounter (Signed)
MEDICATION: insulin aspart (NOVOLOG FLEXPEN) 100 UNIT/ML FlexPen  PHARMACY:   [facility] [place] [facility] [place] (Phone) [phone] (Fax)     IS THIS A 90 DAY SUPPLY : no  IS PATIENT OUT OF MEDICATION: yes  IF NOT; HOW MUCH IS LEFT:   LAST APPOINTMENT DATE: @07 /26/2018 NEXT APPOINTMENT DATE:@10 /26/2018  OTHER COMMENTS: patient states that pharmacy has been faxing for medication for a week. He is completely out of meds.    **Let patient know to contact pharmacy at the end of the day to make sure medication is ready. **  ** Please notify patient to allow 48-72 hours to process**  **Encourage patient to contact the pharmacy for refills or they can request refills through [facility]**

## 2017-05-02 NOTE — Telephone Encounter (Signed)
Submitted

## 2017-05-13 ENCOUNTER — Other Ambulatory Visit: Payer: Self-pay | Admitting: Cardiovascular Disease

## 2017-05-13 DIAGNOSIS — I10 Essential (primary) hypertension: Secondary | ICD-10-CM

## 2017-05-13 DIAGNOSIS — R079 Chest pain, unspecified: Secondary | ICD-10-CM

## 2017-05-13 DIAGNOSIS — I2510 Atherosclerotic heart disease of native coronary artery without angina pectoris: Secondary | ICD-10-CM

## 2017-05-13 DIAGNOSIS — E785 Hyperlipidemia, unspecified: Secondary | ICD-10-CM

## 2017-05-15 ENCOUNTER — Ambulatory Visit (INDEPENDENT_AMBULATORY_CARE_PROVIDER_SITE_OTHER): Payer: Medicare Other | Admitting: Cardiothoracic Surgery

## 2017-05-15 ENCOUNTER — Encounter: Payer: Self-pay | Admitting: Cardiothoracic Surgery

## 2017-05-15 VITALS — BP 118/64 | HR 70 | Ht 69.0 in | Wt 274.0 lb

## 2017-05-15 DIAGNOSIS — I2510 Atherosclerotic heart disease of native coronary artery without angina pectoris: Secondary | ICD-10-CM | POA: Diagnosis not present

## 2017-05-15 DIAGNOSIS — Z951 Presence of aortocoronary bypass graft: Secondary | ICD-10-CM

## 2017-05-15 DIAGNOSIS — I70213 Atherosclerosis of native arteries of extremities with intermittent claudication, bilateral legs: Secondary | ICD-10-CM | POA: Diagnosis not present

## 2017-05-15 DIAGNOSIS — K921 Melena: Secondary | ICD-10-CM | POA: Diagnosis not present

## 2017-05-15 NOTE — Progress Notes (Signed)
PCP is Tonia Ghent, MD Referring Provider is Wellington Hampshire, MD  Chief Complaint  Patient presents with  . CABG follow up    6 month follow up    HPI:6 month follow-up after CABG in 2015. Patient has obesity diabetes and smoking history. The patient had PCI postop for graft occlusion. EF is 55% by last echo. No symptoms of angina.  Patient complains today of bilateral calf claudication with ambulation which resolves when he stops. No night pain. In 2015 the patient had bilateral ABIs of 0.9 prior to CABG. In 2016 the patient had a left SFA stent placed at [facility] . He is on chronic Plavix.  The patient had several days of melena last month and is set to be evaluated with upper and lower endoscopy.  The patient has bilateral exertional lower leg pain and MRI of spine showing degenerative disc disease and possible spinal stenosis. He has an appointment to be evaluated by a spine specialist.   Past Medical History:  Diagnosis Date  . Coronary artery disease    a. CABG 04/2014: (LIMA--> LAD, SVG --> DIAG, SVG--> OM1, SVG--> PDA)  b. early graft failure. s/p successful rotational atherectomy and stenting of the left main and proximal LAD with DES. DES of mid LAD.(07/22/14)  . CVA (cerebral infarction)   . Hyperlipidemia   . Hypertension 2000  . OSA on CPAP   . Peripheral vascular disease ([place])    left SFA angioplasty in December 2016  . Prostate cancer ([place])   . Prostate cancer ([place])   . Prostate cancer ([place])   . Prostate cancer ([place])   . Stroke ([place])   . Type II diabetes mellitus ([place])     Past Surgical History:  Procedure Laterality Date  . ANTERIOR CERVICAL DECOMP/DISCECTOMY FUSION  08/25/2012   Procedure: ANTERIOR CERVICAL DECOMPRESSION/DISCECTOMY FUSION 2 LEVELS;  Surgeon: Hosie Spangle, MD;  Location: [place] [facility];  Service: Neurosurgery;  Laterality: N/A;  Cervical five-six Cervical six-seven anterior cervical decompression with fusion and plating and bonegraft   . BACK SURGERY    . CARDIAC CATHETERIZATION  04/2014; 07/19/2014  . CORONARY ARTERY BYPASS GRAFT N/A 04/26/2014   Procedure: CORONARY ARTERY BYPASS GRAFTING (CABG), on pump, times four, using left internal mammary artery, right greater saphenous vein harvested endoscopically.;  Surgeon: Ivin Poot, MD;  Location: [place];  Service: Open Heart Surgery;  Laterality: N/A;  LIMA to LAD, SVG to DIAGONAL, SVG to OM1, SVG to PDA) with EVH of the RIGHT THIGH and LOWER EXTREMITY SAPHENOUS VEIN  . Cytoscopy prostatic stone o/w nml  06/21/08   Dr. Jacqlyn Larsen  . ETT myoview  09/13/09   Low risk EF 49%  . High intense focused ultrasound  07/20/06   By Dr. Jacqlyn Larsen  . INTRAOPERATIVE TRANSESOPHAGEAL ECHOCARDIOGRAM N/A 04/26/2014   Procedure: INTRAOPERATIVE TRANSESOPHAGEAL ECHOCARDIOGRAM;  Surgeon: Ivin Poot, MD;  Location: [place];  Service: Open Heart Surgery;  Laterality: N/A;  . PERCUTANEOUS CORONARY ROTOBLATOR INTERVENTION (PCI-R) N/A 07/22/2014   Procedure: PERCUTANEOUS CORONARY ROTOBLATOR INTERVENTION (PCI-R);  Surgeon: Peter M Martinique, MD;  Location: [facility] CATH LAB;  Service: Cardiovascular;  Laterality: N/A;  . PERIPHERAL VASCULAR CATHETERIZATION Left 07/26/2015   Procedure: Lower Extremity Angiography;  Surgeon: Katha Cabal, MD;  Location: [place] CV LAB;  Service: Cardiovascular;  Laterality: Left;  . PERIPHERAL VASCULAR CATHETERIZATION  07/26/2015   Procedure: Lower Extremity Intervention;  Surgeon: Katha Cabal, MD;  Location: [place] CV LAB;  Service: Cardiovascular;;  .  PROSTATE BIOPSY  04/03/06 & 02/25/07    Family History  Problem Relation Age of Onset  . Diabetes Mother 73       DM  . Coronary artery disease Mother   . Hypertension Mother   . Heart attack Mother        CABG with MI in 2005  . Heart disease Mother        CV  . Hypertension Father   . Heart attack Father        CABG with Mi around 1997  . Coronary artery disease Father   . Heart disease Father        CV   . Diabetes Father   . Heart attack Brother        MI/PTCA  . Heart disease Brother        CV  . Diabetes Brother   . Heart attack Maternal Grandfather        MI  . Prostate cancer Paternal Grandfather   . Diabetes Sister        DM  . Breast cancer Neg Hx        Breast/ovarian/uterine cancer  . Colon cancer Neg Hx   . Depression Neg Hx   . Alcohol abuse Neg Hx        ETOH/drug abuse  . Stroke Neg Hx     Social History Social History  Substance Use Topics  . Smoking status: Former Smoker    Packs/day: 1.00    Years: 36.00    Types: Cigarettes    Quit date: 03/01/2014  . Smokeless tobacco: Never Used  . Alcohol use Yes     Comment: 07/19/2014 "might have a drink a couple times/yr"    Current Outpatient Prescriptions  Medication Sig Dispense Refill  . aspirin EC 81 MG tablet Take 1 tablet (81 mg total) by mouth daily. 90 tablet 3  . B Complex Vitamins (VITAMIN B COMPLEX PO) Take by mouth.    . B-D ULTRAFINE III SHORT PEN 31G X 8 MM MISC USE AS DIRECTED TWICE DAILY 100 each 11  . bicalutamide (CASODEX) 50 MG tablet Take 50 mg by mouth daily.    . canagliflozin (INVOKANA) 300 MG TABS tablet Take 1 tablet with breakfast daily 30 tablet 11  . carvedilol (COREG) 3.125 MG tablet TAKE 1 TABLET BY MOUTH TWICE (2) DAILY WITH MEALS 60 tablet 0  . clopidogrel (PLAVIX) 75 MG tablet Take 1 tablet (75 mg total) by mouth daily. 30 tablet 6  . Dulaglutide (TRULICITY) 1.5 XI/3.3AS SOPN Inject 1.5 mg under skin weekly 4 pen 11  . ezetimibe (ZETIA) 10 MG tablet Take 1 tablet (10 mg total) by mouth daily. 30 tablet 4  . furosemide (LASIX) 40 MG tablet Take 1 tablet (40 mg total) by mouth daily as needed for fluid or edema. 30 tablet 3  . glucose blood (ONE TOUCH ULTRA TEST) test strip Check blood sugar twice a day and as directed Dx E11.59 200 each 3  . insulin aspart (NOVOLOG FLEXPEN) 100 UNIT/ML FlexPen INJECT 40 UNITS INTO THE SKIN 3 TIMES DAILY WITH MEALS 30 mL 1  . leuprolide (LUPRON) 30  MG injection Inject 45 mg into the muscle every 6 (six) months.    Marland Kitchen losartan (COZAAR) 25 MG tablet TAKE 1/2 TABLET BY MOUTH ONCE DAILY 30 tablet 0  . NITROSTAT 0.4 MG SL tablet Place 1 tablet (0.4 mg total) under the tongue as needed. 25 tablet 3  . potassium chloride SA (K-DUR,KLOR-CON)  20 MEQ tablet Take 1 tablet (20 mEq total) by mouth daily as needed (Take one tabley if you take a Lasix tablet). 30 tablet 3  . TOUJEO SOLOSTAR 300 UNIT/ML SOPN INJECT 120 UNITS INTO THE SKIN AT BEDTIME 13.5 mL 1  . UNIFINE PENTIPS 29G X 12MM MISC USE AS DIRECTED TWICE DAILY 100 each 5   No current facility-administered medications for this visit.     Allergies  Allergen Reactions  . Coreg [Carvedilol] Other (See Comments)    Fatigue- unclear if this was due to coreg specifically or beta blockers in general.    . Metformin And Related Nausea Only  . Rosuvastatin Calcium     REACTION: JOINT ACHES  . Statins     REACTION: JOINT ACHES    Review of Systems   Cant walk because of bilateral calf pain-claudication Fatigue with playing 18 holes of golf using cart No abdominal pain No presyncope No palpitations  BP 118/64   Pulse 70   Ht 5\' 9"  (1.753 m)   Wt 274 lb (124.3 kg)   SpO2 95%   BMI 40.46 kg/m  Physical Exam       Exam    General- alert and comfortable   Lungs- clear without rales, wheezes   Cor- regular rate and rhythm, no murmur , gallop   Abdomen- soft, non-tender   Extremities - warm, non-tender, minimal edema feet warm but with nonpalpable pulses   Neuro- oriented, appropriate, no focal weakness   Diagnostic Tests: Old pre-CABG Doppler report reviewed 2015 showing ABI 0.9 bilateral  Impression: Stable coronary disease management medically with normal LV function Increasing bilateral claudication having significant limitation on his daily activities and ability to walk  Plan:  Refer to [facility] for evaluation of bilateral claudication Return here for follow-up in 6  months. Len Childs, MD [facility] [phone]

## 2017-05-17 ENCOUNTER — Other Ambulatory Visit: Payer: Self-pay

## 2017-05-17 DIAGNOSIS — I739 Peripheral vascular disease, unspecified: Secondary | ICD-10-CM

## 2017-06-04 DIAGNOSIS — I739 Peripheral vascular disease, unspecified: Secondary | ICD-10-CM | POA: Diagnosis not present

## 2017-06-04 DIAGNOSIS — M5136 Other intervertebral disc degeneration, lumbar region: Secondary | ICD-10-CM | POA: Diagnosis not present

## 2017-06-04 DIAGNOSIS — M5126 Other intervertebral disc displacement, lumbar region: Secondary | ICD-10-CM | POA: Diagnosis not present

## 2017-06-04 DIAGNOSIS — M48062 Spinal stenosis, lumbar region with neurogenic claudication: Secondary | ICD-10-CM | POA: Diagnosis not present

## 2017-06-04 DIAGNOSIS — M47816 Spondylosis without myelopathy or radiculopathy, lumbar region: Secondary | ICD-10-CM | POA: Diagnosis not present

## 2017-06-04 DIAGNOSIS — Z6841 Body Mass Index (BMI) 40.0 and over, adult: Secondary | ICD-10-CM | POA: Diagnosis not present

## 2017-06-04 DIAGNOSIS — R030 Elevated blood-pressure reading, without diagnosis of hypertension: Secondary | ICD-10-CM | POA: Diagnosis not present

## 2017-06-07 ENCOUNTER — Encounter: Payer: Self-pay | Admitting: Internal Medicine

## 2017-06-07 ENCOUNTER — Ambulatory Visit (INDEPENDENT_AMBULATORY_CARE_PROVIDER_SITE_OTHER): Payer: Medicare Other | Admitting: Internal Medicine

## 2017-06-07 VITALS — BP 124/72 | HR 65 | Wt 276.0 lb

## 2017-06-07 DIAGNOSIS — Z23 Encounter for immunization: Secondary | ICD-10-CM

## 2017-06-07 DIAGNOSIS — E1159 Type 2 diabetes mellitus with other circulatory complications: Secondary | ICD-10-CM | POA: Diagnosis not present

## 2017-06-07 DIAGNOSIS — E785 Hyperlipidemia, unspecified: Secondary | ICD-10-CM | POA: Diagnosis not present

## 2017-06-07 DIAGNOSIS — E1165 Type 2 diabetes mellitus with hyperglycemia: Secondary | ICD-10-CM | POA: Diagnosis not present

## 2017-06-07 DIAGNOSIS — I2510 Atherosclerotic heart disease of native coronary artery without angina pectoris: Secondary | ICD-10-CM

## 2017-06-07 LAB — POCT GLYCOSYLATED HEMOGLOBIN (HGB A1C): Hemoglobin A1C: 7

## 2017-06-07 NOTE — Addendum Note (Signed)
Addended by: Caprice Beaver T on: 06/07/2017 10:23 AM   Modules accepted: Orders

## 2017-06-07 NOTE — Patient Instructions (Addendum)
Please continue - Invokana 300 mg before b'fast - Trulicity 1.5 mg weekly - Novolog 40 units 3x a day - 15 min before meals - Toujeo 120 units (60 x2) aftyer dinner  Please return in 3 months with your sugar log.

## 2017-06-07 NOTE — Progress Notes (Signed)
Patient ID: Phillip Clements, male   DOB: 1950/10/29, 66 y.o.   MRN: [id number]  HPI: [name] is a 66 y.o.-year-old male, returning for f/u for DM2, dx in 1996, insulin-dependent since 2008, uncontrolled, with complications (CAD - s/p stents, CABG, cerebro-vascular ds - s/p CVA, PVD). Last visit 3 months ago. He changed to [facility] in 10/2016.  Stopped Metformin b/c GI discomfort 1 mo ago.  Last hemoglobin A1c was: Lab Results  Component Value Date   HGBA1C 7.8 03/07/2017   HGBA1C 6.8 11/20/2016   HGBA1C 8.1 08/20/2016   Pt is on a regimen of: Stopped Metformin ER 1000 mg with dinner  - Invokana 300 mg before b'fast - Trulicity 1.5 mg weekly - Novolog 40 units 3x a day - 15 min before meals >> 2x a day now as he started to skip b'fast - Toujeo 120 units (60 x2) after dinner He tried Victoza and Januvia. Had GI intolerance (nausea, diarrhea) to regular Metformin.  Pt checks his sugars 1-3x a day:: - am: after workout:  104-208, 220 (forgot insulin) >> 146, 170-244 >> last 3 weeks: 139-198 - 2h after b'fast: n/c - before lunch: 84-158, 173 >> 74, 159, 180-235 >> 95-145 - 2h after lunch: n/c - before dinner:133, 152-303 >> 132, 143 >> 115, 148 >> 136 - 2h after dinner: n/c - bedtime:  166, 244-376 >> 160 >> n/c - nighttime: n/c Lowest sugar was 74 >> 95; he has hypoglycemia awareness at 70s.  Highest sugar was 244 >> 280.  Glucometer: OneTouch Ultra mini  Pt's meals are plant based for last 1 year: - Breakfast: thin bagel + cream cheese + special K cinnamon cereal >> stopped eating - Lunch: sandwich, chips, cottage cheese, pineapple - Dinner: 2 veggies, salads; Sat night: meat - Snacks: 2/day: celery + PB; low salt triscuit + laughing cow cheese  - + Mild CKD, last BUN/creatinine:  Lab Results  Component Value Date   BUN 23 04/04/2017   CREATININE 0.99 04/04/2017  On losartan. - + Hyperlipidemia; last set of lipids: Lab Results  Component Value Date   CHOL 97 (L)  09/24/2016   HDL 38 (L) 09/24/2016   LDLCALC 37 09/24/2016   LDLDIRECT 157.1 09/07/2013   TRIG 111 09/24/2016   CHOLHDL 2.6 09/24/2016  He had joint pain from statins in the past. He was on Repatha q 2 weeks - now 65 and this is not covered by his currently only on ezetimibe. - last eye exam was 06/2016: No DR Had cataract sx. - Denies he denies numbness and tingling in his feet.  ROS: Constitutional: no weight gain/no weight loss, no fatigue, no subjective hyperthermia, no subjective hypothermia Eyes: no blurry vision, no xerophthalmia ENT: no sore throat, no nodules palpated in throat, no dysphagia, no odynophagia, no hoarseness Cardiovascular: no CP/no SOB/no palpitations/no leg swelling Respiratory: no cough/no SOB/no wheezing Gastrointestinal: no N/no V/no D/no C/no acid reflux Musculoskeletal: no muscle aches/no joint aches Skin: no rashes, no hair loss Neurological: no tremors/no numbness/no tingling/no dizziness  I reviewed pt's medications, allergies, PMH, social hx, family hx, and changes were documented in the history of present illness. Otherwise, unchanged from my initial visit note.  Past Medical History:  Diagnosis Date  . Coronary artery disease    a. CABG 04/2014: (LIMA--> LAD, SVG --> DIAG, SVG--> OM1, SVG--> PDA)  b. early graft failure. s/p successful rotational atherectomy and stenting of the left main and proximal LAD with DES. DES of mid LAD.(07/22/14)  .  CVA (cerebral infarction)   . Hyperlipidemia   . Hypertension 2000  . OSA on CPAP   . Peripheral vascular disease ([place])    left SFA angioplasty in December 2016  . Prostate cancer ([place])   . Prostate cancer ([place])   . Prostate cancer ([place])   . Prostate cancer ([place])   . Stroke ([place])   . Type II diabetes mellitus ([place])    Past Surgical History:  Procedure Laterality Date  . ANTERIOR CERVICAL DECOMP/DISCECTOMY FUSION  08/25/2012   Procedure: ANTERIOR CERVICAL DECOMPRESSION/DISCECTOMY FUSION 2 LEVELS;   Surgeon: Hosie Spangle, MD;  Location: [place] [facility]UR[facility] ORS;  Service: Neurosurgery;  Laterality: N/A;  Cervical five-six Cervical six-seven anterior cervical decompression with fusion and plating and bonegraft  . BACK SURGERY    . CARDIAC CATHETERIZATION  04/2014; 07/19/2014  . CORONARY ARTERY BYPASS GRAFT N/A 04/26/2014   Procedure: CORONARY ARTERY BYPASS GRAFTING (CABG), on pump, times four, using left internal mammary artery, right greater saphenous vein harvested endoscopically.;  Surgeon: Ivin Poot, MD;  Location: [place];  Service: Open Heart Surgery;  Laterality: N/A;  LIMA to LAD, SVG to DIAGONAL, SVG to OM1, SVG to PDA) with EVH of the RIGHT THIGH and LOWER EXTREMITY SAPHENOUS VEIN  . Cytoscopy prostatic stone o/w nml  06/21/08   Dr. Jacqlyn Larsen  . ETT myoview  09/13/09   Low risk EF 49%  . High intense focused ultrasound  07/20/06   By Dr. Jacqlyn Larsen  . INTRAOPERATIVE TRANSESOPHAGEAL ECHOCARDIOGRAM N/A 04/26/2014   Procedure: INTRAOPERATIVE TRANSESOPHAGEAL ECHOCARDIOGRAM;  Surgeon: Ivin Poot, MD;  Location: [place];  Service: Open Heart Surgery;  Laterality: N/A;  . PERCUTANEOUS CORONARY ROTOBLATOR INTERVENTION (PCI-R) N/A 07/22/2014   Procedure: PERCUTANEOUS CORONARY ROTOBLATOR INTERVENTION (PCI-R);  Surgeon: Peter M Martinique, MD;  Location: [facility] CATH LAB;  Service: Cardiovascular;  Laterality: N/A;  . PERIPHERAL VASCULAR CATHETERIZATION Left 07/26/2015   Procedure: Lower Extremity Angiography;  Surgeon: Katha Cabal, MD;  Location: [place] CV LAB;  Service: Cardiovascular;  Laterality: Left;  . PERIPHERAL VASCULAR CATHETERIZATION  07/26/2015   Procedure: Lower Extremity Intervention;  Surgeon: Katha Cabal, MD;  Location: [place] CV LAB;  Service: Cardiovascular;;  . PROSTATE BIOPSY  04/03/06 & 02/25/07   Social History   Social History  . Marital status: Married    Spouse name: N/A  . Number of children: 3  . Years of education: N/A   Occupational History  . Publishing rights manager    Social History Main Topics  . Smoking status: Former Smoker    Packs/day: 1.00    Years: 36.00    Types: Cigarettes    Quit date: 03/01/2014  . Smokeless tobacco: Never Used  . Alcohol use Yes     Comment: 07/19/2014 "might have a drink a couple times/yr"  . Drug use: No  . Sexual activity: Not Currently   Other Topics Concern  . Not on file   Social History Narrative   Lives with wife.   2 daughters and 1 son.   [employer]   Former offensive lineman.    Current Outpatient Prescriptions on File Prior to Visit  Medication Sig Dispense Refill  . aspirin EC 81 MG tablet Take 1 tablet (81 mg total) by mouth daily. 90 tablet 3  . B Complex Vitamins (VITAMIN B COMPLEX PO) Take by mouth.    . B-D ULTRAFINE III SHORT PEN 31G X 8 MM MISC USE  AS DIRECTED TWICE DAILY 100 each 11  . bicalutamide (CASODEX) 50 MG tablet Take 50 mg by mouth daily.    . canagliflozin (INVOKANA) 300 MG TABS tablet Take 1 tablet with breakfast daily 30 tablet 11  . carvedilol (COREG) 3.125 MG tablet TAKE 1 TABLET BY MOUTH TWICE (2) DAILY WITH MEALS 60 tablet 0  . clopidogrel (PLAVIX) 75 MG tablet Take 1 tablet (75 mg total) by mouth daily. 30 tablet 6  . Dulaglutide (TRULICITY) 1.5 KK/9.3GH SOPN Inject 1.5 mg under skin weekly 4 pen 11  . ezetimibe (ZETIA) 10 MG tablet Take 1 tablet (10 mg total) by mouth daily. 30 tablet 4  . furosemide (LASIX) 40 MG tablet Take 1 tablet (40 mg total) by mouth daily as needed for fluid or edema. 30 tablet 3  . glucose blood (ONE TOUCH ULTRA TEST) test strip Check blood sugar twice a day and as directed Dx E11.59 200 each 3  . insulin aspart (NOVOLOG FLEXPEN) 100 UNIT/ML FlexPen INJECT 40 UNITS INTO THE SKIN 3 TIMES DAILY WITH MEALS 30 mL 1  . leuprolide (LUPRON) 30 MG injection Inject 45 mg into the muscle every 6 (six) months.    Marland Kitchen losartan (COZAAR) 25 MG tablet TAKE 1/2 TABLET BY MOUTH ONCE DAILY 30 tablet 0  .  NITROSTAT 0.4 MG SL tablet Place 1 tablet (0.4 mg total) under the tongue as needed. 25 tablet 3  . potassium chloride SA (K-DUR,KLOR-CON) 20 MEQ tablet Take 1 tablet (20 mEq total) by mouth daily as needed (Take one tabley if you take a Lasix tablet). 30 tablet 3  . TOUJEO SOLOSTAR 300 UNIT/ML SOPN INJECT 120 UNITS INTO THE SKIN AT BEDTIME 13.5 mL 1  . UNIFINE PENTIPS 29G X 12MM MISC USE AS DIRECTED TWICE DAILY 100 each 5   No current facility-administered medications on file prior to visit.    Allergies  Allergen Reactions  . Coreg [Carvedilol] Other (See Comments)    Fatigue- unclear if this was due to coreg specifically or beta blockers in general.    . Metformin And Related Nausea Only  . Rosuvastatin Calcium     REACTION: JOINT ACHES  . Statins     REACTION: JOINT ACHES   Family History  Problem Relation Age of Onset  . Diabetes Mother 7       DM  . Coronary artery disease Mother   . Hypertension Mother   . Heart attack Mother        CABG with MI in 2005  . Heart disease Mother        CV  . Hypertension Father   . Heart attack Father        CABG with Mi around 1997  . Coronary artery disease Father   . Heart disease Father        CV  . Diabetes Father   . Heart attack Brother        MI/PTCA  . Heart disease Brother        CV  . Diabetes Brother   . Heart attack Maternal Grandfather        MI  . Prostate cancer Paternal Grandfather   . Diabetes Sister        DM  . Breast cancer Neg Hx        Breast/ovarian/uterine cancer  . Colon cancer Neg Hx   . Depression Neg Hx   . Alcohol abuse Neg Hx        ETOH/drug abuse  .  Stroke Neg Hx    PE: Wt 276 lb (125.2 kg)   BMI 40.76 kg/m  Body mass index is 40.76 kg/m. Wt Readings from Last 3 Encounters:  06/07/17 276 lb (125.2 kg)  05/15/17 274 lb (124.3 kg)  04/22/17 273 lb 6 oz (124 kg)   Constitutional: overweight, in NAD Eyes: PERRLA, EOMI, no exophthalmos ENT: moist mucous membranes, no thyromegaly, no  cervical lymphadenopathy Cardiovascular: RRR, No MRG Respiratory: CTA B Gastrointestinal: abdomen soft, NT, ND, BS+ Musculoskeletal: no deformities, strength intact in all 4 Skin: moist, warm, + stasis dermatitis bilateral legs Neurological: no tremor with outstretched hands, DTR normal in all 4  ASSESSMENT: 1. DM2, insulin-dependent, uncontrolled, with complications - CAD - s/p stents, CABG - cerebro-vascular ds - s/p CVA  - PVD - s/p L stent  2. HL  PLAN:  1. Patient with long-standing, now with better control lately especially after stopping eating b'fast. His sugars in am are still mostly high, now off Metformin. Will continue off it as he had severe GI sxs from the med. He is determined to work on [employer] portions. I also advised him to add comments to his log so he can learn what factors affect am sugars. - OTW, will continue current regimen - he continue to walk on the treadmill but has back and calf pain - at last visits, we discussed about U500 insulin >> we may need to use this in the near future, but not needed for now - I suggested to:  Patient Instructions  Please continue - Metformin ER 1000 mg with dinner - Invokana 300 mg before b'fast - Trulicity 1.5 mg weekly - Novolog 40 units 3x a day - 15 min before meals - Toujeo 120 units (60 x2) aftyer dinner  Please return in 3 months with your sugar log.   - today, HbA1c is 7% (better!) - continue checking sugars at different times of the day - check 3x a day, rotating checks - advised for yearly eye exams >> he is UTD - will give flu shot - Return to clinic in 3 mo with sugar log   2. HL - Intolerance to statins - Previously on Repatha but not covered by insurance anymore - Currently continues on ezetimibe only  Philemon Kingdom, MD PhD [facility] Endocrinology

## 2017-06-11 ENCOUNTER — Encounter: Payer: Self-pay | Admitting: Internal Medicine

## 2017-06-11 ENCOUNTER — Ambulatory Visit (AMBULATORY_SURGERY_CENTER): Payer: Medicare Other | Admitting: Internal Medicine

## 2017-06-11 VITALS — BP 134/62 | HR 67 | Temp 97.3°F | Resp 12 | Ht 69.0 in | Wt 273.0 lb

## 2017-06-11 DIAGNOSIS — Z8719 Personal history of other diseases of the digestive system: Secondary | ICD-10-CM

## 2017-06-11 DIAGNOSIS — D120 Benign neoplasm of cecum: Secondary | ICD-10-CM

## 2017-06-11 DIAGNOSIS — K2970 Gastritis, unspecified, without bleeding: Secondary | ICD-10-CM

## 2017-06-11 DIAGNOSIS — D125 Benign neoplasm of sigmoid colon: Secondary | ICD-10-CM | POA: Diagnosis not present

## 2017-06-11 DIAGNOSIS — K635 Polyp of colon: Secondary | ICD-10-CM

## 2017-06-11 DIAGNOSIS — D124 Benign neoplasm of descending colon: Secondary | ICD-10-CM

## 2017-06-11 DIAGNOSIS — K269 Duodenal ulcer, unspecified as acute or chronic, without hemorrhage or perforation: Secondary | ICD-10-CM

## 2017-06-11 DIAGNOSIS — K921 Melena: Secondary | ICD-10-CM | POA: Diagnosis not present

## 2017-06-11 DIAGNOSIS — D121 Benign neoplasm of appendix: Secondary | ICD-10-CM | POA: Diagnosis not present

## 2017-06-11 DIAGNOSIS — K2990 Gastroduodenitis, unspecified, without bleeding: Secondary | ICD-10-CM | POA: Diagnosis not present

## 2017-06-11 DIAGNOSIS — D122 Benign neoplasm of ascending colon: Secondary | ICD-10-CM | POA: Diagnosis not present

## 2017-06-11 DIAGNOSIS — K2210 Ulcer of esophagus without bleeding: Secondary | ICD-10-CM | POA: Diagnosis not present

## 2017-06-11 DIAGNOSIS — D123 Benign neoplasm of transverse colon: Secondary | ICD-10-CM

## 2017-06-11 MED ORDER — PANTOPRAZOLE SODIUM 40 MG PO TBEC: 40.0000 mg | DELAYED_RELEASE_TABLET | Freq: Every day | ORAL | 3 refills | Status: DC

## 2017-06-11 MED ORDER — SODIUM CHLORIDE 0.9 % IV SOLN: 500.0000 mL | INTRAVENOUS | Status: DC

## 2017-06-11 NOTE — Progress Notes (Signed)
Called to room to assist during endoscopic procedure.  Patient ID and intended procedure confirmed with present staff. Received instructions for my participation in the procedure from the performing physician.  

## 2017-06-11 NOTE — Op Note (Signed)
Phillip Clements Patient Name: Phillip Clements Procedure Date: 06/11/2017 3:58 PM MRN: [id number] Endoscopist: Gatha Mayer , MD Age: 65 Referring MD:  Date of Birth: 09-08-1950 Gender: Male Account #: [id number] Procedure:                Upper GI endoscopy Indications:              Melena Medicines:                Propofol per Anesthesia, Monitored Anesthesia Care Procedure:                Pre-Anesthesia Assessment:                           - Prior to the procedure, a History and Physical                            was performed, and patient medications and                            allergies were reviewed. The patient's tolerance of                            previous anesthesia was also reviewed. The risks                            and benefits of the procedure and the sedation                            options and risks were discussed with the patient.                            All questions were answered, and informed consent                            was obtained. Prior Anticoagulants: The patient                            last took Plavix (clopidogrel) 5 days prior to the                            procedure. ASA Grade Assessment: III - A patient                            with severe systemic disease. After reviewing the                            risks and benefits, the patient was deemed in                            satisfactory condition to undergo the procedure.                           After obtaining informed consent, the endoscope was  passed under direct vision. Throughout the                            procedure, the patient's blood pressure, pulse, and                            oxygen saturations were monitored continuously. The                            Endoscope was introduced through the mouth, and                            advanced to the second part of duodenum. The upper                            GI endoscopy was  accomplished without difficulty.                            The patient tolerated the procedure well. Scope In: Scope Out: Findings:                 LA Grade A (one or more mucosal breaks less than 5                            mm, not extending between tops of 2 mucosal folds)                            esophagitis with no bleeding was found at the                            gastroesophageal junction.                           One non-bleeding cratered duodenal ulcer with no                            stigmata of bleeding was found in the second                            portion of the duodenum. The lesion was 5 mm in                            largest dimension.                           Patchy moderate inflammation characterized by                            erosions and erythema was found in the duodenal                            bulb and in the second portion of the duodenum.  Diffuse moderate inflammation characterized by                            congestion (edema), friability and granularity was                            found in the gastric antrum. Biopsies were taken                            with a cold forceps for Helicobacter pylori testing                            using CLOtest. Verification of patient                            identification for the specimen was done. Estimated                            blood loss was minimal.                           The exam was otherwise without abnormality.                           The cardia and gastric fundus were normal on                            retroflexion. Complications:            No immediate complications. Estimated Blood Loss:     Estimated blood loss was minimal. Impression:               - LA Grade A reflux esophagitis.                           - One non-bleeding duodenal ulcer with no stigmata                            of bleeding.                           - Duodenitis.                            - Gastritis. Biopsied.                           - The examination was otherwise normal. Recommendation:           - Patient has a contact number available for                            emergencies. The signs and symptoms of potential                            delayed complications were discussed with the  patient. Return to normal activities tomorrow.                            Written discharge instructions were provided to the                            patient.                           - Resume previous diet.                           - Continue present medications.                           - Resume Plavix (clopidogrel) at prior dose                            tomorrow.                           - Await pathology results.                           - Use Protonix (pantoprazole) 40 mg PO daily.                           - See the other procedure note for documentation of                            additional recommendations. Gatha Mayer, MD 06/11/2017 4:56:11 PM This report has been signed electronically.

## 2017-06-11 NOTE — Op Note (Signed)
[place] Patient Name: Phillip Clements Procedure Date: 06/11/2017 4:17 PM MRN: [id number] Endoscopist: Gatha Mayer , MD Age: 66 Referring MD:  Date of Birth: 15-Sep-1950 Gender: Male Account #: [id number] Procedure:                Colonoscopy Indications:              [facility] Medicines:                Propofol per Anesthesia, Monitored Anesthesia Care Procedure:                Pre-Anesthesia Assessment:                           - Prior to the procedure, a History and Physical                            was performed, and patient medications and                            allergies were reviewed. The patient's tolerance of                            previous anesthesia was also reviewed. The risks                            and benefits of the procedure and the sedation                            options and risks were discussed with the patient.                            All questions were answered, and informed consent                            was obtained. Prior Anticoagulants: The patient                            last took Plavix (clopidogrel) 5 days prior to the                            procedure. ASA Grade Assessment: III - A patient                            with severe systemic disease. After reviewing the                            risks and benefits, the patient was deemed in                            satisfactory condition to undergo the procedure.                           - Prior to the procedure, a History and Physical  was performed, and patient medications and                            allergies were reviewed. The patient's tolerance of                            previous anesthesia was also reviewed. The risks                            and benefits of the procedure and the sedation                            options and risks were discussed with the patient.                            All questions were answered, and  informed consent                            was obtained. Prior Anticoagulants: The patient                            last took Plavix (clopidogrel) 5 days prior to the                            procedure. ASA Grade Assessment: III - A patient                            with severe systemic disease. After reviewing the                            risks and benefits, the patient was deemed in                            satisfactory condition to undergo the procedure.                           After obtaining informed consent, the colonoscope                            was passed under direct vision. Throughout the                            procedure, the patient's blood pressure, pulse, and                            oxygen saturations were monitored continuously. The                            Colonoscope was introduced through the anus and                            advanced to the the cecum, identified by  appendiceal orifice and ileocecal valve. The                            colonoscopy was performed without difficulty. The                            patient tolerated the procedure well. The quality                            of the bowel preparation was good. The bowel                            preparation used was Miralax. The ileocecal valve,                            appendiceal orifice, and rectum were photographed. Scope In: 4:18:12 PM Scope Out: 4:44:26 PM Total Procedure Duration: 0 hours 26 minutes 14 seconds  Findings:                 Ten flat and sessile polyps were found in the                            sigmoid colon, descending colon, transverse colon,                            ascending colon and ileocecal valve. The polyps                            were 4 to 12 mm in size. These polyps were removed                            with a cold snare. Resection and retrieval were                            complete. Verification of patient  identification                            for the specimen was done. Estimated blood loss was                            minimal.                           The exam was otherwise without abnormality on                            direct and retroflexion views. Complications:            No immediate complications. Estimated Blood Loss:     Estimated blood loss was minimal. Impression:               - Ten 4 to 12 mm polyps in the sigmoid colon, in  the descending colon, in the transverse colon, in                            the ascending colon and at the ileocecal valve,                            removed with a cold snare. Resected and retrieved.                           - The examination was otherwise normal on direct                            and retroflexion views. Recommendation:           - Patient has a contact number available for                            emergencies. The signs and symptoms of potential                            delayed complications were discussed with the                            patient. Return to normal activities tomorrow.                            Written discharge instructions were provided to the                            patient.                           - Resume previous diet.                           - Continue present medications.                           - Resume Plavix (clopidogrel) at prior dose                            tomorrow.                           - Repeat colonoscopy is recommended. The                            colonoscopy date will be determined after pathology                            results from today's exam become available for                            review. Gatha Mayer, MD 06/11/2017 5:00:19 PM This report has been signed electronically.

## 2017-06-11 NOTE — Patient Instructions (Addendum)
There was inflammation in the esophagus, stomach and intestine. You most likely leaked blood from ulcer(s) in the upper intestine (duodenum).  I checked for an infection but you need to start pantoprazole to reduce acid and heal these problem areas.  I also found and removed 10 polyps, small-medium in size.  I will let you know pathology results and when to have another routine colonoscopy by mail and/or My Chart.  Please restart Plavix tomorrow.  I appreciate the opportunity to care for you. Gatha Mayer, MD, FACG  YOU HAD AN ENDOSCOPIC PROCEDURE TODAY AT [facility]:   Refer to the procedure report that was given to you for any specific questions about what was found during the examination.  If the procedure report does not answer your questions, please call your gastroenterologist to clarify.  If you requested that your care partner not be given the details of your procedure findings, then the procedure report has been included in a sealed envelope for you to review at your convenience later.  YOU SHOULD EXPECT: Some feelings of bloating in the abdomen. Passage of more gas than usual.  Walking can help get rid of the air that was put into your GI tract during the procedure and reduce the bloating. If you had a lower endoscopy (such as a colonoscopy or flexible sigmoidoscopy) you may notice spotting of blood in your stool or on the toilet paper. If you underwent a bowel prep for your procedure, you may not have a normal bowel movement for a few days.  Please Note:  You might notice some irritation and congestion in your nose or some drainage.  This is from the oxygen used during your procedure.  There is no need for concern and it should clear up in a day or so.  SYMPTOMS TO REPORT IMMEDIATELY:   Following lower endoscopy (colonoscopy or flexible sigmoidoscopy):  Excessive amounts of blood in the stool  Significant tenderness or worsening of abdominal  pains  Swelling of the abdomen that is new, acute  Fever of 100F or higher   Following upper endoscopy (EGD)  Vomiting of blood or coffee ground material  New chest pain or pain under the shoulder blades  Painful or persistently difficult swallowing  New shortness of breath  Fever of 100F or higher  Black, tarry-looking stools  For urgent or emergent issues, a gastroenterologist can be reached at any hour by calling [phone].   DIET:  We do recommend a small meal at first, but then you may proceed to your regular diet.  Drink plenty of fluids but you should avoid alcoholic beverages for 24 hours.  ACTIVITY:  You should plan to take it easy for the rest of today and you should NOT DRIVE or use heavy machinery until tomorrow (because of the sedation medicines used during the test).    FOLLOW UP: Our staff will call the number listed on your records the next business day following your procedure to check on you and address any questions or concerns that you may have regarding the information given to you following your procedure. If we do not reach you, we will leave a message.  However, if you are feeling well and you are not experiencing any problems, there is no need to return our call.  We will assume that you have returned to your regular daily activities without incident.  If any biopsies were taken you will be contacted by phone or by letter within the  next 1-3 weeks.  Please call us at [phone] if you have not heard about the biopsies in 3 weeks.    SIGNATURES/CONFIDENTIALITY: You and/or your care partner have signed paperwork which will be entered into your electronic medical record.  These signatures attest to the fact that that the information above on your After Visit Summary has been reviewed and is understood.  Full responsibility of the confidentiality of this discharge information lies with you and/or your care-partner.

## 2017-06-11 NOTE — Progress Notes (Signed)
Spontaneous respirations throughout. VSS. Resting comfortably. To PACU on room air. Report to  RN. 

## 2017-06-12 ENCOUNTER — Telehealth: Payer: Self-pay | Admitting: *Deleted

## 2017-06-12 LAB — HELICOBACTER PYLORI SCREEN-BIOPSY: UREASE: NEGATIVE

## 2017-06-12 NOTE — Telephone Encounter (Signed)
  Follow up Call-  Call back number 06/11/2017  Post procedure Call Back phone  # [phone]  Permission to leave phone message Yes  Some recent data might be hidden     Patient questions:  Do you have a fever, pain , or abdominal swelling? No. Pain Score  0 *  Have you tolerated food without any problems? Yes.    Have you been able to return to your normal activities? Yes.    Do you have any questions about your discharge instructions: Diet   No. Medications  No. Follow up visit  No.  Do you have questions or concerns about your Care? No.  Actions: * If pain score is 4 or above: No action needed, pain <4.

## 2017-06-14 ENCOUNTER — Other Ambulatory Visit: Payer: Self-pay | Admitting: Cardiovascular Disease

## 2017-06-14 ENCOUNTER — Other Ambulatory Visit: Payer: Self-pay | Admitting: Internal Medicine

## 2017-06-14 DIAGNOSIS — I2510 Atherosclerotic heart disease of native coronary artery without angina pectoris: Secondary | ICD-10-CM

## 2017-06-14 DIAGNOSIS — I10 Essential (primary) hypertension: Secondary | ICD-10-CM

## 2017-06-14 DIAGNOSIS — R079 Chest pain, unspecified: Secondary | ICD-10-CM

## 2017-06-14 DIAGNOSIS — E785 Hyperlipidemia, unspecified: Secondary | ICD-10-CM

## 2017-06-14 NOTE — Progress Notes (Signed)
H pylori test is negative - does not have an infection causing his problems Schedule a next available f/u me re GERD and duodenitis  No recall

## 2017-06-14 NOTE — Telephone Encounter (Signed)
Patient is not on this medication/thx dmf

## 2017-06-17 ENCOUNTER — Encounter: Payer: Self-pay | Admitting: Internal Medicine

## 2017-06-17 DIAGNOSIS — Z86010 Personal history of colonic polyps: Secondary | ICD-10-CM

## 2017-06-17 DIAGNOSIS — Z860101 Personal history of adenomatous and serrated colon polyps: Secondary | ICD-10-CM

## 2017-06-17 HISTORY — DX: Personal history of adenomatous and serrated colon polyps: Z86.0101

## 2017-06-17 HISTORY — DX: Personal history of colonic polyps: Z86.010

## 2017-06-17 NOTE — Progress Notes (Signed)
10 polyps most adenomas, some hyperplastic - recall 3 years

## 2017-06-19 ENCOUNTER — Other Ambulatory Visit: Payer: Self-pay | Admitting: Internal Medicine

## 2017-06-26 ENCOUNTER — Encounter: Payer: Self-pay | Admitting: Vascular Surgery

## 2017-06-26 ENCOUNTER — Ambulatory Visit (HOSPITAL_COMMUNITY)
Admission: RE | Admit: 2017-06-26 | Discharge: 2017-06-26 | Disposition: A | Discharge status: Home / Self Care | Payer: Medicare Other | Source: Ambulatory Visit | Attending: Vascular Surgery | Admitting: Vascular Surgery

## 2017-06-26 ENCOUNTER — Other Ambulatory Visit: Payer: Self-pay

## 2017-06-26 ENCOUNTER — Ambulatory Visit (INDEPENDENT_AMBULATORY_CARE_PROVIDER_SITE_OTHER): Payer: Medicare Other | Admitting: Vascular Surgery

## 2017-06-26 VITALS — BP 131/62 | HR 71 | Temp 97.4°F | Resp 16 | Ht 69.0 in | Wt 271.0 lb

## 2017-06-26 DIAGNOSIS — I70219 Atherosclerosis of native arteries of extremities with intermittent claudication, unspecified extremity: Secondary | ICD-10-CM

## 2017-06-26 DIAGNOSIS — I739 Peripheral vascular disease, unspecified: Secondary | ICD-10-CM | POA: Diagnosis not present

## 2017-06-26 NOTE — Progress Notes (Signed)
Patient name: Phillip Clements MRN: [id number] DOB: 05/19/1951 Sex: male   REASON FOR CONSULT:    Claudication.  The consult is requested by Dr. Tharon Aquas Trigt.  HPI:   Phillip Clements is a pleasant 66 y.o. male, who is referred for evaluation of leg pain.  He describes 2 different aspects of his leg pain.  First, he describes pain in both calves which occurs with standing and is equal on both sides.  This pain is limited to his calves.  Second, he describes pain in his hips thighs and calves bilaterally which is brought on by ambulation and relieved somewhat with rest.  The symptoms are equal on both sides.  He walks 30 minutes a day on a treadmill at a slow pace.  He does not describe any rest pain.  He has no history of nonhealing ulcers.  He does have significant degenerative disc disease of his back.  His risk factors for peripheral vascular disease include diabetes, hypertension, hyperlipidemia, and a history of tobacco use.  He quit smoking 3 years ago.  He denies any family history of premature cardiovascular disease.  His neurosurgeon is Dr. Sherwood Gambler.  He underwent coronary revascularization 3 years ago.  He believes vein was taken from both legs.  He also tells me that he had a stent placed in his left leg at [facility].  Past Medical History:  Diagnosis Date  . Cataract    bilateral eye in 2010  . Coronary artery disease    a. CABG 04/2014: (LIMA--> LAD, SVG --> DIAG, SVG--> OM1, SVG--> PDA)  b. early graft failure. s/p successful rotational atherectomy and stenting of the left main and proximal LAD with DES. DES of mid LAD.(07/22/14)  . CVA (cerebral infarction)   . Hx of adenomatous colonic polyps 06/17/2017  . Hyperlipidemia   . Hypertension 2000  . OSA on CPAP   . Peripheral vascular disease ([place])    left SFA angioplasty in December 2016  . Prostate cancer ([place])   . Sleep apnea    wears CPAP nightly  . Stroke ([place])   . Type II diabetes  mellitus (H[facility])     Family History  Problem Relation Age of Onset  . Diabetes Mother 64       DM  . Coronary artery disease Mother   . Hypertension Mother   . Heart attack Mother        CABG with MI in 2005  . Heart disease Mother        CV  . Hypertension Father   . Heart attack Father        CABG with Mi around 1997  . Coronary artery disease Father   . Heart disease Father        CV  . Diabetes Father   . Heart attack Brother        MI/PTCA  . Heart disease Brother        CV  . Diabetes Brother   . Heart attack Maternal Grandfather        MI  . Prostate cancer Paternal Grandfather   . Diabetes Sister        DM  . Breast cancer Neg Hx        Breast/ovarian/uterine cancer  . Colon cancer Neg Hx   . Depression Neg Hx   . Alcohol abuse Neg Hx        ETOH/drug abuse  . Stroke Neg Hx  SOCIAL HISTORY: Social History   Socioeconomic History  . Marital status: Married    Spouse name: Not on file  . Number of children: 3  . Years of education: Not on file  . Highest education level: Not on file  Social Needs  . Financial resource strain: Not on file  . Food insecurity - worry: Not on file  . Food insecurity - inability: Not on file  . Transportation needs - medical: Not on file  . Transportation needs - non-medical: Not on file  Occupational History  . Occupation: Insurance account manager  Tobacco Use  . Smoking status: Former Smoker    Packs/day: 1.00    Years: 36.00    Pack years: 36.00    Types: Cigarettes    Last attempt to quit: 03/01/2014    Years since quitting: 3.3  . Smokeless tobacco: Never Used  Substance and Sexual Activity  . Alcohol use: Yes    Comment: 07/19/2014 "might have a drink a couple times/yr"  . Drug use: No  . Sexual activity: Not Currently  Other Topics Concern  . Not on file  Social History Narrative   Lives with wife.   2 daughters and 1 son.   [employer]   Former offensive  lineman.     Allergies  Allergen Reactions  . Coreg [Carvedilol] Other (See Comments)    Fatigue- unclear if this was due to coreg specifically or beta blockers in general.    . Metformin And Related Nausea Only  . Rosuvastatin Calcium     REACTION: JOINT ACHES  . Statins     REACTION: JOINT ACHES    Current Outpatient Medications  Medication Sig Dispense Refill  . aspirin EC 81 MG tablet Take 1 tablet (81 mg total) by mouth daily. 90 tablet 3  . B-D ULTRAFINE III SHORT PEN 31G X 8 MM MISC USE AS DIRECTED TWICE DAILY 100 each 11  . bicalutamide (CASODEX) 50 MG tablet Take 50 mg by mouth daily.    . canagliflozin (INVOKANA) 300 MG TABS tablet Take 1 tablet with breakfast daily 30 tablet 11  . carvedilol (COREG) 3.125 MG tablet TAKE 1 TABLET BY MOUTH TWICE A DAY WITH MEALS 60 tablet 3  . clopidogrel (PLAVIX) 75 MG tablet Take 1 tablet (75 mg total) by mouth daily. 30 tablet 6  . Dulaglutide (TRULICITY) 1.5 ZO/1.0RU SOPN Inject 1.5 mg under skin weekly 4 pen 11  . ezetimibe (ZETIA) 10 MG tablet Take 1 tablet (10 mg total) by mouth daily. 30 tablet 4  . glucose blood (ONE TOUCH ULTRA TEST) test strip Check blood sugar twice a day and as directed Dx E11.59 200 each 3  . insulin aspart (NOVOLOG FLEXPEN) 100 UNIT/ML FlexPen INJECT 40 UNITS INTO THE SKIN 3 TIMES DAILY WITH MEALS 30 mL 1  . leuprolide (LUPRON) 30 MG injection Inject 45 mg into the muscle every 6 (six) months.    Marland Kitchen losartan (COZAAR) 25 MG tablet TAKE 1/2 TABLET BY MOUTH ONCE DAILY 30 tablet 0  . NITROSTAT 0.4 MG SL tablet Place 1 tablet (0.4 mg total) under the tongue as needed. 25 tablet 3  . pantoprazole (PROTONIX) 40 MG tablet Take 1 tablet (40 mg total) by mouth daily before breakfast. 90 tablet 3  . TOUJEO SOLOSTAR 300 UNIT/ML SOPN INJECT 120 UNITS INTO THE SKIN AT BEDTIME 13.5 mL 1  . UNIFINE PENTIPS 29G X 12MM MISC USE AS DIRECTED TWICE DAILY  100 each 5  . B Complex Vitamins (VITAMIN B COMPLEX PO) Take by mouth.    .  furosemide (LASIX) 40 MG tablet Take 1 tablet (40 mg total) by mouth daily as needed for fluid or edema. (Patient not taking: Reported on 06/26/2017) 30 tablet 3  . potassium chloride SA (K-DUR,KLOR-CON) 20 MEQ tablet Take 1 tablet (20 mEq total) by mouth daily as needed (Take one tabley if you take a Lasix tablet). (Patient not taking: Reported on 06/26/2017) 30 tablet 3   No current facility-administered medications for this visit.     REVIEW OF SYSTEMS:  [X]  denotes positive finding, [ ]  denotes negative finding Cardiac  Comments:  Chest pain or chest pressure:    Shortness of breath upon exertion:    Short of breath when lying flat:    Irregular heart rhythm:        Vascular    Pain in calf, thigh, or hip brought on by ambulation: X   Pain in feet at night that wakes you up from your sleep:     Blood clot in your veins:    Leg swelling:         Pulmonary    Oxygen at home:    Productive cough:     Wheezing:         Neurologic    Sudden weakness in arms or legs:     Sudden numbness in arms or legs:     Sudden onset of difficulty speaking or slurred speech:    Temporary loss of vision in one eye:     Problems with dizziness:         Gastrointestinal    Blood in stool:     Vomited blood:         Genitourinary    Burning when urinating:     Blood in urine:        Psychiatric    Major depression:         Hematologic    Bleeding problems:    Problems with blood clotting too easily:        Skin    Rashes or ulcers:        Constitutional    Fever or chills:     PHYSICAL EXAM:   Vitals:   06/26/17 1444  BP: 131/62  Pulse: 71  Resp: 16  Temp: (!) 97.4 F (36.3 C)  TempSrc: Oral  SpO2: 96%  Weight: 271 lb (122.9 kg)  Height: 5\' 9"  (1.753 m)   Body mass index is 40.02 kg/m.   GENERAL: The patient is a well-nourished male, in no acute distress. The vital signs are documented above. CARDIAC: There is a regular rate and rhythm.  VASCULAR: I do not detect  carotid bruits. Because of his obesity it is difficult to palpate his femoral pulses however he does have palpable femoral pulses.  I cannot palpate popliteal or pedal pulses. He has mild bilateral lower extremity swelling. PULMONARY: There is good air exchange bilaterally without wheezing or rales. ABDOMEN: He is obese and his abdomen is difficult to assess. MUSCULOSKELETAL: There are no major deformities or cyanosis. NEUROLOGIC: No focal weakness or paresthesias are detected. SKIN: There are no ulcers or rashes noted. PSYCHIATRIC: The patient has a normal affect.  DATA:    ARTERIAL DOPPLER STUDY: I have independently interpreted his arterial Doppler study today.  On the right side he has a biphasic posterior tibial signal with a monophasic dorsalis pedis signal.  ABIs 97%.  Toe pressure on the right is 93 mmHg.  On the left side he has a triphasic posterior tibial signal and triphasic dorsalis pedis signal.  ABI is 100%.  Toe pressure on the left is 94 mmHg.  MEDICAL ISSUES:   BILATERAL LOWER EXTREMITY PAIN: Based on his exam, he may have some mild infrainguinal arterial occlusive disease.  However, he has triphasic Doppler signals in the left foot with an ABI of 100% and a biphasic posterior tibial signal on the right with an ABI of 97%.  Given that he has symptoms at rest when standing I think this more likely is related to his degenerative disc disease.  He is able to walk for 30 minutes on the treadmill without much problem.  Fortunately he quit smoking 3 years ago.  I would not recommend an aggressive approach to his mild infrainguinal arterial occlusive disease unless he developed rest pain or nonhealing ulcer.  I have ordered follow-up ABIs in 1 year and I will see him back at that time.  He knows to call sooner if he has problems.  Deitra Mayo [facility] [place] [phone]

## 2017-06-27 NOTE — Addendum Note (Signed)
Addended by: Lianne Cure A on: 06/27/2017 03:51 PM   Modules accepted: Orders

## 2017-07-02 ENCOUNTER — Other Ambulatory Visit: Payer: Self-pay | Admitting: Internal Medicine

## 2017-07-16 DIAGNOSIS — M5136 Other intervertebral disc degeneration, lumbar region: Secondary | ICD-10-CM | POA: Diagnosis not present

## 2017-07-16 DIAGNOSIS — M48062 Spinal stenosis, lumbar region with neurogenic claudication: Secondary | ICD-10-CM | POA: Diagnosis not present

## 2017-07-16 DIAGNOSIS — M5126 Other intervertebral disc displacement, lumbar region: Secondary | ICD-10-CM | POA: Diagnosis not present

## 2017-07-16 DIAGNOSIS — M47816 Spondylosis without myelopathy or radiculopathy, lumbar region: Secondary | ICD-10-CM | POA: Diagnosis not present

## 2017-07-16 DIAGNOSIS — Z6841 Body Mass Index (BMI) 40.0 and over, adult: Secondary | ICD-10-CM | POA: Diagnosis not present

## 2017-07-26 ENCOUNTER — Other Ambulatory Visit: Payer: Self-pay | Admitting: Cardiovascular Disease

## 2017-07-26 ENCOUNTER — Other Ambulatory Visit: Payer: Self-pay | Admitting: Internal Medicine

## 2017-07-26 DIAGNOSIS — I2510 Atherosclerotic heart disease of native coronary artery without angina pectoris: Secondary | ICD-10-CM

## 2017-07-26 DIAGNOSIS — R079 Chest pain, unspecified: Secondary | ICD-10-CM

## 2017-07-26 DIAGNOSIS — E785 Hyperlipidemia, unspecified: Secondary | ICD-10-CM

## 2017-07-26 DIAGNOSIS — I10 Essential (primary) hypertension: Secondary | ICD-10-CM

## 2017-08-02 ENCOUNTER — Telehealth: Payer: Self-pay | Admitting: Cardiovascular Disease

## 2017-08-02 NOTE — Telephone Encounter (Signed)
Provider out of office r/s 2/8 appt with Fletcher Anon

## 2017-08-14 DIAGNOSIS — C7951 Secondary malignant neoplasm of bone: Secondary | ICD-10-CM | POA: Diagnosis not present

## 2017-08-14 DIAGNOSIS — C61 Malignant neoplasm of prostate: Secondary | ICD-10-CM | POA: Diagnosis not present

## 2017-08-14 DIAGNOSIS — Z6838 Body mass index (BMI) 38.0-38.9, adult: Secondary | ICD-10-CM | POA: Diagnosis not present

## 2017-08-20 ENCOUNTER — Encounter: Payer: Self-pay | Admitting: Internal Medicine

## 2017-08-20 ENCOUNTER — Ambulatory Visit (INDEPENDENT_AMBULATORY_CARE_PROVIDER_SITE_OTHER): Payer: Medicare Other | Admitting: Internal Medicine

## 2017-08-20 VITALS — BP 100/60 | HR 72 | Ht 69.0 in | Wt 273.0 lb

## 2017-08-20 DIAGNOSIS — K2100 Gastro-esophageal reflux disease with esophagitis, without bleeding: Secondary | ICD-10-CM

## 2017-08-20 DIAGNOSIS — Z7902 Long term (current) use of antithrombotics/antiplatelets: Secondary | ICD-10-CM | POA: Diagnosis not present

## 2017-08-20 DIAGNOSIS — K269 Duodenal ulcer, unspecified as acute or chronic, without hemorrhage or perforation: Secondary | ICD-10-CM | POA: Diagnosis not present

## 2017-08-20 NOTE — Patient Instructions (Signed)
We will be in touch with plans regarding your medicine after Dr Carlean Purl consults with your cardiologist.     I appreciate the opportunity to care for you. Silvano Rusk, MD, [facility]

## 2017-08-20 NOTE — Progress Notes (Signed)
Phillip Clements 67 y.o. 03-03-1951 [id number]  Assessment & Plan:   Encounter Diagnoses  Name Primary?  . Reflux esophagitis Yes  . Duodenal ulcer   . Long term current use of antithrombotics/antiplatelets    I think he should be on a PPI given the findings at EGD in October and the fact that he takes clopidogrel and aspirin.  Unfortunately he had diarrhea from pantoprazole.  I am just learning about this now.  I am going to ask his cardiologist if he has an issue with omeprazole as there are some reports that that may interfere with clopidogrel effectiveness though my understanding is that not conclusive.  That is on his formulary.  No other PPI is other than the pantoprazole that gave him diarrhea.  Might need to use high-dose H2 blocker.  We will contact him once I hear from Dr. Sheran Luz, Elveria Rising, MD Dr. Jenna Luo  I conferred with Dr. Jenna Luo and will use Dexilant 30 mg qd - it is on formulary  Subjective:   Chief Complaint: Follow-up reflux esophagitis duodenal ulcer and colon polyps  HPI The patient is here, he was seen in October and had melena.  He underwent colonoscopy and EGD after holding his Plavix, EGD demonstrated grade a reflux esophagitis a small duodenal ulcer and duodenitis with negative H. pylori testing.  He had 10 polyps removed mostly adenomas and is due for a 3-year colonoscopy follow-up.  He does not have any obvious symptoms other than occasional burping at this time with respect to the GI tract, he was started on pantoprazole and developed diarrhea but did not notify me about that.  He stopped it he restarted it and the diarrhea returned and it was voluminous and so he stopped the medication.  He has not had further melena. Allergies  Allergen Reactions  . Coreg [Carvedilol] Other (See Comments)    Fatigue- unclear if this was due to coreg specifically or beta blockers in general.    . Metformin And Related Nausea Only  . Protonix [Pantoprazole  Sodium] Diarrhea  . Rosuvastatin Calcium     REACTION: JOINT ACHES  . Statins     REACTION: JOINT ACHES   Current Meds  Medication Sig  . aspirin EC 81 MG tablet Take 1 tablet (81 mg total) by mouth daily.  . B Complex Vitamins (VITAMIN B COMPLEX PO) Take by mouth.  . B-D ULTRAFINE III SHORT PEN 31G X 8 MM MISC USE AS DIRECTED TWICE DAILY  . bicalutamide (CASODEX) 50 MG tablet Take 50 mg by mouth daily.  . canagliflozin (INVOKANA) 300 MG TABS tablet Take 1 tablet with breakfast daily  . carvedilol (COREG) 3.125 MG tablet TAKE 1 TABLET BY MOUTH TWICE A DAY WITH MEALS  . clopidogrel (PLAVIX) 75 MG tablet Take 1 tablet (75 mg total) by mouth daily.  . Dulaglutide (TRULICITY) 1.5 FU/9.3AT SOPN Inject 1.5 mg under skin weekly  . ezetimibe (ZETIA) 10 MG tablet Take 1 tablet (10 mg total) by mouth daily.  . furosemide (LASIX) 40 MG tablet Take 1 tablet (40 mg total) by mouth daily as needed for fluid or edema.  Marland Kitchen glucose blood (ONE TOUCH ULTRA TEST) test strip Check blood sugar twice a day and as directed Dx E11.59  . leuprolide (LUPRON) 30 MG injection Inject 45 mg into the muscle every 6 (six) months.  Marland Kitchen losartan (COZAAR) 25 MG tablet TAKE 1/2 TABLET BY MOUTH ONCE A DAY  . NITROSTAT 0.4 MG SL tablet Place  1 tablet (0.4 mg total) under the tongue as needed.  Marland Kitchen NOVOLOG FLEXPEN 100 UNIT/ML FlexPen INJECT 40 UNITS INTO THE SKIN 3 TIMES DAILY WITH MEALS  . potassium chloride SA (K-DUR,KLOR-CON) 20 MEQ tablet Take 1 tablet (20 mEq total) by mouth daily as needed (Take one tabley if you take a Lasix tablet).  . TOUJEO SOLOSTAR 300 UNIT/ML SOPN INJECT 120 UNITS INTO THE SKIN AT BEDTIME  . UNIFINE PENTIPS 29G X 12MM MISC USE AS DIRECTED TWICE DAILY   Past Medical History:  Diagnosis Date  . Cataract    bilateral eye in 2010  . Coronary artery disease    a. CABG 04/2014: (LIMA--> LAD, SVG --> DIAG, SVG--> OM1, SVG--> PDA)  b. early graft failure. s/p successful rotational atherectomy and stenting of  the left main and proximal LAD with DES. DES of mid LAD.(07/22/14)  . CVA (cerebral infarction)   . Hx of adenomatous colonic polyps 06/17/2017  . Hyperlipidemia   . Hypertension 2000  . OSA on CPAP   . Peripheral vascular disease ([place])    left SFA angioplasty in December 2016  . Prostate cancer ([place])   . Sleep apnea    wears CPAP nightly  . Stroke ([place])   . Type II diabetes mellitus ([place])    Past Surgical History:  Procedure Laterality Date  . ANTERIOR CERVICAL DECOMP/DISCECTOMY FUSION  08/25/2012   Procedure: ANTERIOR CERVICAL DECOMPRESSION/DISCECTOMY FUSION 2 LEVELS;  Surgeon: Hosie Spangle, MD;  Location: [facility];  Service: Neurosurgery;  Laterality: N/A;  Cervical five-six Cervical six-seven anterior cervical decompression with fusion and plating and bonegraft  . BACK SURGERY    . CARDIAC CATHETERIZATION  04/2014; 07/19/2014  . CORONARY ARTERY BYPASS GRAFT N/A 04/26/2014   Procedure: CORONARY ARTERY BYPASS GRAFTING (CABG), on pump, times four, using left internal mammary artery, right greater saphenous vein harvested endoscopically.;  Surgeon: Ivin Poot, MD;  Location: [facility];  Service: Open Heart Surgery;  Laterality: N/A;  LIMA to LAD, SVG to DIAGONAL, SVG to OM1, SVG to PDA) with EVH of the RIGHT THIGH and LOWER EXTREMITY SAPHENOUS VEIN  . Cytoscopy prostatic stone o/w nml  06/21/08   Dr. Jacqlyn Larsen  . ETT myoview  09/13/09   Low risk EF 49%  . High intense focused ultrasound  07/20/06   By Dr. Jacqlyn Larsen  . INTRAOPERATIVE TRANSESOPHAGEAL ECHOCARDIOGRAM N/A 04/26/2014   Procedure: INTRAOPERATIVE TRANSESOPHAGEAL ECHOCARDIOGRAM;  Surgeon: Ivin Poot, MD;  Location: [facility];  Service: Open Heart Surgery;  Laterality: N/A;  . PERCUTANEOUS CORONARY ROTOBLATOR INTERVENTION (PCI-R) N/A 07/22/2014   Procedure: PERCUTANEOUS CORONARY ROTOBLATOR INTERVENTION (PCI-R);  Surgeon: Peter M Martinique, MD;  Location: [facility];  Service: Cardiovascular;  Laterality: N/A;  . PERIPHERAL VASCULAR  CATHETERIZATION Left 07/26/2015   Procedure: Lower Extremity Angiography;  Surgeon: Katha Cabal, MD;  Location: [facility] CV LAB;  Service: Cardiovascular;  Laterality: Left;  . PERIPHERAL VASCULAR CATHETERIZATION  07/26/2015   Procedure: Lower Extremity Intervention;  Surgeon: Katha Cabal, MD;  Location: [place] CV LAB;  Service: Cardiovascular;;  . PROSTATE BIOPSY  04/03/06 & 02/25/07   Social History   Social History Narrative   Lives with wife.   2 daughters and 1 son.   [employer]   Former offensive lineman.    family history includes Coronary artery disease in his father and mother; Diabetes in his brother, father, and sister; Diabetes (age of onset: 48) in his mother; Heart attack  in his brother, father, maternal grandfather, and mother; Heart disease in his brother, father, and mother; Hypertension in his father and mother; Prostate cancer in his paternal grandfather.   Review of Systems He is having leg pain claudication-like symptoms with negative ankle-brachial indexes, he has seen neurosurgery as well.  He is frustrated that he cannot be active anymore.  Objective:   Physical Exam BP 100/60   Pulse 72   Ht 5\' 9"  (1.753 m)   Wt 273 lb (123.8 kg)   BMI 40.32 kg/m   Obese no acute distress  15 minutes time spent with patient > half in counseling coordination of care

## 2017-08-21 ENCOUNTER — Telehealth: Payer: Self-pay

## 2017-08-21 NOTE — Telephone Encounter (Signed)
Left him a message to call me back. 

## 2017-08-21 NOTE — Telephone Encounter (Signed)
-----   Message from Gatha Mayer, MD sent at 08/21/2017  4:08 PM EST ----- Regarding: Rx update Let him know that after conferring with Dr. Jenna Luo I am prescribing Dexilant 30 mg qd before breakfast # 30 [id number] RF  Please find out where he wants [place] to send it

## 2017-08-22 MED ORDER — DEXLANSOPRAZOLE 30 MG PO CPDR: 30.0000 mg | DELAYED_RELEASE_CAPSULE | Freq: Every day | ORAL | 11 refills | Status: DC

## 2017-08-22 NOTE — Telephone Encounter (Signed)
Patient returned phone call. °

## 2017-08-22 NOTE — Telephone Encounter (Signed)
Patient informed that Dexilant 30mg  will be sent in , confirmed pharmacy and number he wanted sent in.  He will let us know if he has any problems.

## 2017-08-23 ENCOUNTER — Other Ambulatory Visit: Payer: Self-pay | Admitting: Cardiovascular Disease

## 2017-08-23 DIAGNOSIS — I2510 Atherosclerotic heart disease of native coronary artery without angina pectoris: Secondary | ICD-10-CM

## 2017-08-23 DIAGNOSIS — R079 Chest pain, unspecified: Secondary | ICD-10-CM

## 2017-08-23 DIAGNOSIS — E785 Hyperlipidemia, unspecified: Secondary | ICD-10-CM

## 2017-08-23 DIAGNOSIS — I10 Essential (primary) hypertension: Secondary | ICD-10-CM

## 2017-08-26 ENCOUNTER — Telehealth: Payer: Self-pay

## 2017-08-26 NOTE — Telephone Encounter (Signed)
[facility] sent a fax for a drug change request.  The Dexilant that the cardiologist approved cost $297.  Please advise Sir.

## 2017-08-27 NOTE — Telephone Encounter (Signed)
Left message to call me back on his cell #.

## 2017-08-27 NOTE — Telephone Encounter (Signed)
I think his most cost-effective option is to purchase OTC P[place]acid (lansoprazole) and take 2 each day.

## 2017-08-28 NOTE — Telephone Encounter (Signed)
Spoke with Clair Gulling and sent him a MyChart message per his request with the OTC prevacid that Dr Carlean Purl wants him to try instead of the [place].

## 2017-09-03 ENCOUNTER — Telehealth: Payer: Self-pay | Admitting: Internal Medicine

## 2017-09-09 ENCOUNTER — Ambulatory Visit (INDEPENDENT_AMBULATORY_CARE_PROVIDER_SITE_OTHER): Payer: Medicare Other | Admitting: Internal Medicine

## 2017-09-09 ENCOUNTER — Encounter: Payer: Self-pay | Admitting: Internal Medicine

## 2017-09-09 VITALS — BP 118/80 | HR 67 | Ht 69.0 in | Wt 276.0 lb

## 2017-09-09 DIAGNOSIS — E785 Hyperlipidemia, unspecified: Secondary | ICD-10-CM

## 2017-09-09 DIAGNOSIS — I2510 Atherosclerotic heart disease of native coronary artery without angina pectoris: Secondary | ICD-10-CM | POA: Diagnosis not present

## 2017-09-09 DIAGNOSIS — E1165 Type 2 diabetes mellitus with hyperglycemia: Secondary | ICD-10-CM | POA: Diagnosis not present

## 2017-09-09 DIAGNOSIS — E1159 Type 2 diabetes mellitus with other circulatory complications: Secondary | ICD-10-CM | POA: Diagnosis not present

## 2017-09-09 MED ORDER — INSULIN REGULAR HUMAN (CONC) 500 UNIT/ML ~~LOC~~ SOPN: 50.0000 [IU] | PEN_INJECTOR | Freq: Three times a day (TID) | SUBCUTANEOUS | 5 refills | Status: DC

## 2017-09-09 NOTE — Progress Notes (Signed)
Patient ID: Phillip Clements, male   DOB: 11/11/1950, 67 y.o.   MRN: [id number]  HPI: Phillip Clements is a 67 y.o.-year-old male, returning for f/u for DM2, dx in 1996, insulin-dependent since 2008, uncontrolled, with complications (CAD - s/p stents, CABG, cerebro-vascular ds - s/p CVA, PVD). Last visit 3 mo ago. He changed to [facility] in 10/2016.  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  Last hemoglobin A1c was: Lab Results  Component Value Date   HGBA1C 7.0 06/07/2017   HGBA1C 7.8 03/07/2017   HGBA1C 6.8 11/20/2016   Pt is on a regimen of: - Invokana 300 mg before b'fast - Trulicity 1.5 mg weekly - Novolog 40 units 3x a day - 15 min before meals - Toujeo 120units (60 x2) after dinner He tried Victoza and Januvia. Had GI intolerance (nausea, diarrhea) to regular Metformin and also with metformin ER >>stopped before last visit   Pt checks his sugars 1-3x a day: - am: 146, 170-244 >> last 3 weeks: 139-198 >> 115-256 - 2h after b'fast: n/c - not eating b'fast - before lunch:74, 159, 180-235 >> 95-145 >> 98, 123-197 - 2h after lunch: n/c - before dinner: 132, 143 >> 115, 148 >> 136 >> n/c - 2h after dinner: n/c - bedtime:  166, 244-376 >> 160 >> n/c - nighttime: n/c Lowest sugar was 74 >> 95 >> 98; he has hypoglycemia awareness in the 70s. Highest sugar was 244 >> 280 >> 256.  Glucometer: OneTouch Ultra mini  Pt's meals are plant based for last 1 year: - Breakfast:  skips - Lunch: sandwich, chips, cottage cheese, pineapple - Dinner: 2 veggies, salads; Sat night: meat - Snacks: 2/day: celery + PB; low salt triscuit + laughing cow cheese  He walks on the treadmill 5/7 days  - 30 min, 2/7 days: lifts weight  - + mild CKD, last BUN/creatinine:  Lab Results  Component Value Date   BUN 23 04/04/2017   CREATININE 0.99 04/04/2017  On Losartan. - + HL; last set of lipids: Lab Results  Component Value Date   CHOL 97 (L) 09/24/2016   HDL 38 (L) 09/24/2016   LDLCALC 37  09/24/2016   LDLDIRECT 157.1 09/07/2013   TRIG 111 09/24/2016   CHOLHDL 2.6 09/24/2016  He had joint pain from statins in the past. He was on Repatha q 2 weeks - now 67 and this is not covered >> now only on Zetia. - last eye exam was 06/2016 >> No DR. + h/o cataract Sx - no numbness and tingling in his feet.  ROS: Constitutional: no weight gain/no weight loss, no fatigue, no subjective hyperthermia, no subjective hypothermia Eyes: no blurry vision, no xerophthalmia ENT: no sore throat, no nodules palpated in throat, no dysphagia, no odynophagia, no hoarseness Cardiovascular: no CP/no SOB/no palpitations/no leg swelling Respiratory: no cough/no SOB/no wheezing Gastrointestinal: no N/no V/no D/no C/no acid reflux Musculoskeletal: no muscle aches/no joint aches Skin: no rashes, no hair loss Neurological: no tremors/no numbness/no tingling/no dizziness  I reviewed pt's medications, allergies, PMH, social hx, family hx, and changes were documented in the history of present illness. Otherwise, unchanged from my initial visit note. Started Prevacid.  Past Medical History:  Diagnosis Date  . Cataract    bilateral eye in 2010  . Coronary artery disease    a. CABG 04/2014: (LIMA--> LAD, SVG --> DIAG, SVG--> OM1, SVG--> PDA)  b. early graft failure. s/p successful rotational atherectomy and stenting of the left main and proximal LAD  with DES. DES of mid LAD.(07/22/14)  . CVA (cerebral infarction)   . Hx of adenomatous colonic polyps 06/17/2017  . Hyperlipidemia   . Hypertension 2000  . OSA on CPAP   . Peripheral vascular disease ([place])    left SFA angioplasty in December 2016  . Prostate cancer ([place])   . Sleep apnea    wears CPAP nightly  . Stroke ([place])   . Type II diabetes mellitus ([place])    Past Surgical History:  Procedure Laterality Date  . ANTERIOR CERVICAL DECOMP/DISCECTOMY FUSION  08/25/2012   Procedure: ANTERIOR CERVICAL DECOMPRESSION/DISCECTOMY FUSION 2 LEVELS;  Surgeon: Hosie Spangle, MD;  Location: [place] [facility];  Service: Neurosurgery;  Laterality: N/A;  Cervical five-six Cervical six-seven anterior cervical decompression with fusion and plating and bonegraft  . BACK SURGERY    . CARDIAC CATHETERIZATION  04/2014; 07/19/2014  . CORONARY ARTERY BYPASS GRAFT N/A 04/26/2014   Procedure: CORONARY ARTERY BYPASS GRAFTING (CABG), on pump, times four, using left internal mammary artery, right greater saphenous vein harvested endoscopically.;  Surgeon: Ivin Poot, MD;  Location: [place];  Service: Open Heart Surgery;  Laterality: N/A;  LIMA to LAD, SVG to DIAGONAL, SVG to OM1, SVG to PDA) with EVH of the RIGHT THIGH and LOWER EXTREMITY SAPHENOUS VEIN  . Cytoscopy prostatic stone o/w nml  06/21/08   Dr. Jacqlyn Larsen  . ETT myoview  09/13/09   Low risk EF 49%  . High intense focused ultrasound  07/20/06   By Dr. Jacqlyn Larsen  . INTRAOPERATIVE TRANSESOPHAGEAL ECHOCARDIOGRAM N/A 04/26/2014   Procedure: INTRAOPERATIVE TRANSESOPHAGEAL ECHOCARDIOGRAM;  Surgeon: Ivin Poot, MD;  Location: [place];  Service: Open Heart Surgery;  Laterality: N/A;  . PERCUTANEOUS CORONARY ROTOBLATOR INTERVENTION (PCI-R) N/A 07/22/2014   Procedure: PERCUTANEOUS CORONARY ROTOBLATOR INTERVENTION (PCI-R);  Surgeon: Peter M Martinique, MD;  [facility];  Service: Cardiovascular;  Laterality: N/A;  . PERIPHERAL VASCULAR CATHETERIZATION Left 07/26/2015   Procedure: Lower Extremity Angiography;  Surgeon: Katha Cabal, MD;  Location: [place] CV LAB;  Service: Cardiovascular;  Laterality: Left;  . PERIPHERAL VASCULAR CATHETERIZATION  07/26/2015   Procedure: Lower Extremity Intervention;  Surgeon: Katha Cabal, MD;  Location: [place] [facility];  Service: Cardiovascular;;  . PROSTATE BIOPSY  04/03/06 & 02/25/07   Social History   Socioeconomic History  . Marital status: Married    Spouse name: Not on file  . Number of children: 3  . Years of education: Not on file  . Highest education level: Not on  file  Social Needs  . Financial resource strain: Not on file  . Food insecurity - worry: Not on file  . Food insecurity - inability: Not on file  . Transportation needs - medical: Not on file  . Transportation needs - non-medical: Not on file  Occupational History  . Occupation: Insurance account manager  Tobacco Use  . Smoking status: Former Smoker    Packs/day: 1.00    Years: 36.00    Pack years: 36.00    Types: Cigarettes    Last attempt to quit: 03/01/2014    Years since quitting: 3.5  . Smokeless tobacco: Never Used  Substance and Sexual Activity  . Alcohol use: Yes    Comment: 07/19/2014 "might have a drink a couple times/yr"  . Drug use: No  . Sexual activity: Not Currently  Other Topics Concern  . Not on file  Social History Narrative   Lives with wife.   2  daughters and 1 son.   [employer]   Former offensive lineman.    Current Outpatient Medications on File Prior to Visit  Medication Sig Dispense Refill  . aspirin EC 81 MG tablet Take 1 tablet (81 mg total) by mouth daily. 90 tablet 3  . B Complex Vitamins (VITAMIN B COMPLEX PO) Take by mouth.    . B-D ULTRAFINE III SHORT PEN 31G X 8 MM MISC USE AS DIRECTED TWICE DAILY 100 each 11  . bicalutamide (CASODEX) 50 MG tablet Take 50 mg by mouth daily.    . canagliflozin (INVOKANA) 300 MG TABS tablet Take 1 tablet with breakfast daily 30 tablet 11  . carvedilol (COREG) 3.125 MG tablet TAKE 1 TABLET BY MOUTH TWICE A DAY WITH MEALS 60 tablet 3  . clopidogrel (PLAVIX) 75 MG tablet Take 1 tablet (75 mg total) by mouth daily. 30 tablet 6  . Dexlansoprazole (DEXILANT) 30 MG capsule Take 1 capsule (30 mg total) by mouth daily. 30 capsule 11  . Dulaglutide (TRULICITY) 1.5 TF/5.7DU SOPN Inject 1.5 mg under skin weekly 4 pen 11  . ezetimibe (ZETIA) 10 MG tablet Take 1 tablet (10 mg total) by mouth daily. 30 tablet 4  . furosemide (LASIX) 40 MG tablet Take 1 tablet (40 mg total) by mouth  daily as needed for fluid or edema. 30 tablet 3  . glucose blood (ONE TOUCH ULTRA TEST) test strip Check blood sugar twice a day and as directed Dx E11.59 200 each 3  . leuprolide (LUPRON) 30 MG injection Inject 45 mg into the muscle every 6 (six) months.    Marland Kitchen losartan (COZAAR) 25 MG tablet TAKE 1/2 TABLET BY MOUTH ONCE A DAY 15 tablet 1  . NITROSTAT 0.4 MG SL tablet Place 1 tablet (0.4 mg total) under the tongue as needed. 25 tablet 3  . NOVOLOG FLEXPEN 100 UNIT/ML FlexPen INJECT 40 UNITS INTO THE SKIN 3 TIMES DAILY WITH MEALS 30 mL 1  . potassium chloride SA (K-DUR,KLOR-CON) 20 MEQ tablet Take 1 tablet (20 mEq total) by mouth daily as needed (Take one tabley if you take a Lasix tablet). 30 tablet 3  . TOUJEO SOLOSTAR 300 UNIT/ML SOPN INJECT 120 UNITS INTO THE SKIN AT BEDTIME 13.5 mL 0  . UNIFINE PENTIPS 29G X 12MM MISC USE AS DIRECTED TWICE DAILY 100 each 5   No current facility-administered medications on file prior to visit.    Allergies  Allergen Reactions  . Coreg [Carvedilol] Other (See Comments)    Fatigue- unclear if this was due to coreg specifically or beta blockers in general.    . Metformin And Related Nausea Only  . Protonix [Pantoprazole Sodium] Diarrhea  . Rosuvastatin Calcium     REACTION: JOINT ACHES  . Statins     REACTION: JOINT ACHES   Family History  Problem Relation Age of Onset  . Diabetes Mother 27       DM  . Coronary artery disease Mother   . Hypertension Mother   . Heart attack Mother        CABG with MI in 2005  . Heart disease Mother        CV  . Hypertension Father   . Heart attack Father        CABG with Mi around 1997  . Coronary artery disease Father   . Heart disease Father        CV  . Diabetes Father   . Heart attack  Brother        MI/PTCA  . Heart disease Brother        CV  . Diabetes Brother   . Heart attack Maternal Grandfather        MI  . Prostate cancer Paternal Grandfather   . Diabetes Sister        DM  . Breast cancer Neg  Hx        Breast/ovarian/uterine cancer  . Colon cancer Neg Hx   . Depression Neg Hx   . Alcohol abuse Neg Hx        ETOH/drug abuse  . Stroke Neg Hx    PE: BP 118/80   Pulse 67   Ht 5\' 9"  (1.753 m)   Wt 276 lb (125.2 kg)   SpO2 96%   BMI 40.76 kg/m  Body mass index is 40.76 kg/m. Wt Readings from Last 3 Encounters:  09/09/17 276 lb (125.2 kg)  08/20/17 273 lb (123.8 kg)  06/26/17 271 lb (122.9 kg)   Constitutional: overweight, in NAD Eyes: PERRLA, EOMI, no exophthalmos ENT: moist mucous membranes, no thyromegaly, no cervical lymphadenopathy Cardiovascular: RRR, No MRG Respiratory: CTA B Gastrointestinal: abdomen soft, NT, ND, BS+ Musculoskeletal: no deformities, strength intact in all 4 Skin: moist, warm, + stasis dermatitis B legs Neurological: no tremor with outstretched hands, DTR normal in all 4  ASSESSMENT: 1. DM2, insulin-dependent, uncontrolled, with complications - CAD - s/p stents, CABG - cerebro-vascular ds - s/p CVA  - PVD - s/p L stent  2. HL  PLAN:  1. Patient with long-standing uncontrolled diabetes, now with better control lately.  Before last visit, he stopped eating breakfast and sugars improved, but they were still mostly high in a.m.  as he stopped metformin ER due to GI intolerance.  We continued off metformin, especially as he was determined to work on cutting down dinner portions.  - at this visit, after dietary indiscretions during the Holidays >> sugars are higher in am and he does not check much later in the day >> discussed about improving meals/cuttign down portions and less snacking at night - He continues to walk on the treadmill, but has back and calf pain. Also does weight lifting. - we again discussed about U500 insulin, we discussed about using U500 insulin  as he is using 200 units of insulin daily >> he agrees >> will try to send to his pharmacy - I suggested to:  Patient Instructions  Please continue: - Invokana 300 mg before  b'fast - Trulicity 1.5 mg weekly - Novolog 40 units 3x a day - 15 min before meals - Toujeo 120 units (60 x2) after dinner  However, if you can start U500 insulin, stop Toujeo and Novolog and use the following doses of U500: - 50 units before b'fast (if eating) - 50 units before lunch - 60 units before dinner Continue Invokana and Trulicity while on K160.  Please return in 3 months with your sugar log.   - today, HbA1c is 7.9% (higher) - continue checking sugars at different times of the day - check 3x a day, rotating checks - advised for yearly eye exams >> he is due - Return to clinic in 3 mo with sugar log   2. HL -He is intolerant to statins -He was previously on Repatha, but this is not covered by his insurance anymore -He continues only on ezetimibe -no side effects -has appt with PCP next mo - will have a enw Lipid panel then  Philemon Kingdom, MD PhD [facility] Endocrinology

## 2017-09-09 NOTE — Patient Instructions (Addendum)
Please continue: - Invokana 300 mg before b'fast - Trulicity 1.5 mg weekly - Novolog 40 units 3x a day - 15 min before meals - Toujeo 120 units (60 x2) after dinner  However, if you can start U500 insulin, stop Toujeo and Novolog and use the following doses of U500: - 50 units before b'fast - 50 units before lunch - 60 units before dinner Continue Invokana and Trulicity while on C481.  Please return in 3 months with your sugar log.

## 2017-09-10 LAB — POCT GLYCOSYLATED HEMOGLOBIN (HGB A1C): Hemoglobin A1C: 7.9

## 2017-09-10 NOTE — Addendum Note (Signed)
Addended by: Drucilla Schmidt on: 09/10/2017 08:22 AM   Modules accepted: Orders

## 2017-09-20 ENCOUNTER — Ambulatory Visit: Payer: Medicare Other | Admitting: Cardiovascular Disease

## 2017-09-27 ENCOUNTER — Other Ambulatory Visit: Payer: Self-pay

## 2017-09-27 ENCOUNTER — Encounter: Payer: Self-pay | Admitting: Cardiovascular Disease

## 2017-09-27 ENCOUNTER — Ambulatory Visit (INDEPENDENT_AMBULATORY_CARE_PROVIDER_SITE_OTHER): Payer: Medicare Other | Admitting: Cardiovascular Disease

## 2017-09-27 VITALS — BP 128/64 | HR 69 | Ht 69.0 in | Wt 278.2 lb

## 2017-09-27 DIAGNOSIS — I2510 Atherosclerotic heart disease of native coronary artery without angina pectoris: Secondary | ICD-10-CM | POA: Diagnosis not present

## 2017-09-27 DIAGNOSIS — I10 Essential (primary) hypertension: Secondary | ICD-10-CM | POA: Diagnosis not present

## 2017-09-27 DIAGNOSIS — I739 Peripheral vascular disease, unspecified: Secondary | ICD-10-CM | POA: Diagnosis not present

## 2017-09-27 DIAGNOSIS — E785 Hyperlipidemia, unspecified: Secondary | ICD-10-CM

## 2017-09-27 DIAGNOSIS — I25118 Atherosclerotic heart disease of native coronary artery with other forms of angina pectoris: Secondary | ICD-10-CM

## 2017-09-27 MED ORDER — EVOLOCUMAB 140 MG/ML ~~LOC~~ SOSY: 1.0000 "pen " | PREFILLED_SYRINGE | SUBCUTANEOUS | 11 refills | Status: DC

## 2017-09-27 NOTE — Progress Notes (Unsigned)
Reat

## 2017-09-27 NOTE — Progress Notes (Signed)
Cardiology Office Note   Date:  09/27/2017   ID:  Phillip Clements, DOB 08-04-1951, MRN [id number]  PCP:  Tonia Ghent, MD  Cardiologist:   [name], MD   Chief Complaint  Patient presents with  . OTHER    6 month f/u no complaints today . Meds reviewed verbally with pt.      History of Present Illness: Phillip Clements is a 67 y.o. male who presents for a followup visit regarding coronary artery disease .  He has known history of diabetes for at least 25 years of duration which has not been optimally controlled. He also has other medical conditions that include  Hyperlipidemia, PAD status post left SFA angioplasty in December 2016,  tobacco use, obesity and prostate cancer.  He had CABG in September 2015 for severe three-vessel coronary artery disease.  Ejection fraction was 45%.  He developed recurrent angina  in November, 2015. He underwent a nuclear stress test which was abnormal. He had inferolateral ST elevation with exercise. Nuclear imaging showed evidence of inferior as well as anterior ischemia. Repeat cardiac catheterization showed occluded grafts except for SVG to OM 2. LIMA to LAD was atretic. Ejection fraction was 55%. He underwent atherectomy and stenting of the left main and LAD . He is intolerant to statins. He is tolerating Zetia. Repatha is no longer covered by his insurance.   The patient had melena in September of last year.  He had colonoscopy done which showed multiple polyps that were resected.  He has been doing well with no recent chest pain or shortness of breath.  He complains of low stamina.  He takes his medications regularly.  He tries to exercise.  Past Medical History:  Diagnosis Date  . Cataract    bilateral eye in 2010  . Coronary artery disease    a. CABG 04/2014: (LIMA--> LAD, SVG --> DIAG, SVG--> OM1, SVG--> PDA)  b. early graft failure. s/p successful rotational atherectomy and stenting of the left main and proximal LAD with DES. DES  of mid LAD.(07/22/14)  . CVA (cerebral infarction)   . Hx of adenomatous colonic polyps 06/17/2017  . Hyperlipidemia   . Hypertension 2000  . OSA on CPAP   . Peripheral vascular disease ([place])    left SFA angioplasty in December 2016  . Prostate cancer ([place])   . Sleep apnea    wears CPAP nightly  . Stroke ([place])   . Type II diabetes mellitus ([place])     Past Surgical History:  Procedure Laterality Date  . ANTERIOR CERVICAL DECOMP/DISCECTOMY FUSION  08/25/2012   Procedure: ANTERIOR CERVICAL DECOMPRESSION/DISCECTOMY FUSION 2 LEVELS;  Surgeon: Hosie Spangle, MD;  Location: [place] [facility];  Service: Neurosurgery;  Laterality: N/A;  Cervical five-six Cervical six-seven anterior cervical decompression with fusion and plating and bonegraft  . BACK SURGERY    . CARDIAC CATHETERIZATION  04/2014; 07/19/2014  . CORONARY ARTERY BYPASS GRAFT N/A 04/26/2014   Procedure: CORONARY ARTERY BYPASS GRAFTING (CABG), on pump, times four, using left internal mammary artery, right greater saphenous vein harvested endoscopically.;  Surgeon: Ivin Poot, MD;  Location: [place];  Service: Open Heart Surgery;  Laterality: N/A;  LIMA to LAD, SVG to DIAGONAL, SVG to OM1, SVG to PDA) with EVH of the RIGHT THIGH and LOWER EXTREMITY SAPHENOUS VEIN  . Cytoscopy prostatic stone o/w nml  06/21/08   Dr. Jacqlyn Larsen  . ETT myoview  09/13/09   Low risk EF 49%  . High  intense focused ultrasound  07/20/06   By Dr. Jacqlyn Larsen  . INTRAOPERATIVE TRANSESOPHAGEAL ECHOCARDIOGRAM N/A 04/26/2014   Procedure: INTRAOPERATIVE TRANSESOPHAGEAL ECHOCARDIOGRAM;  Surgeon: Ivin Poot, MD;  Location: [place];  Service: Open Heart Surgery;  Laterality: N/A;  . PERCUTANEOUS CORONARY ROTOBLATOR INTERVENTION (PCI-R) N/A 07/22/2014   Procedure: PERCUTANEOUS CORONARY ROTOBLATOR INTERVENTION (PCI-R);  Surgeon: Peter M Martinique, MD;  Location: [facility] CATH LAB;  Service: Cardiovascular;  Laterality: N/A;  . PERIPHERAL VASCULAR CATHETERIZATION Left 07/26/2015   Procedure:  Lower Extremity Angiography;  Surgeon: Katha Cabal, MD;  Location: [place] CV LAB;  Service: Cardiovascular;  Laterality: Left;  . PERIPHERAL VASCULAR CATHETERIZATION  07/26/2015   Procedure: Lower Extremity Intervention;  Surgeon: Katha Cabal, MD;  Location: [place] CV LAB;  Service: Cardiovascular;;  . PROSTATE BIOPSY  04/03/06 & 02/25/07     Current Outpatient Medications  Medication Sig Dispense Refill  . aspirin EC 81 MG tablet Take 1 tablet (81 mg total) by mouth daily. 90 tablet 3  . B Complex Vitamins (VITAMIN B COMPLEX PO) Take by mouth.    . B-D ULTRAFINE III SHORT PEN 31G X 8 MM MISC USE AS DIRECTED TWICE DAILY 100 each 11  . bicalutamide (CASODEX) 50 MG tablet Take 50 mg by mouth daily.    . canagliflozin (INVOKANA) 300 MG TABS tablet Take 1 tablet with breakfast daily 30 tablet 11  . carvedilol (COREG) 3.125 MG tablet TAKE 1 TABLET BY MOUTH TWICE A DAY WITH MEALS 60 tablet 3  . clopidogrel (PLAVIX) 75 MG tablet Take 1 tablet (75 mg total) by mouth daily. 30 tablet 6  . Dexlansoprazole (DEXILANT) 30 MG capsule Take 1 capsule (30 mg total) by mouth daily. 30 capsule 11  . Dulaglutide (TRULICITY) 1.5 AV/4.0JW SOPN Inject 1.5 mg under skin weekly 4 pen 11  . ezetimibe (ZETIA) 10 MG tablet Take 1 tablet (10 mg total) by mouth daily. 30 tablet 4  . furosemide (LASIX) 40 MG tablet Take 1 tablet (40 mg total) by mouth daily as needed for fluid or edema. 30 tablet 3  . glucose blood (ONE TOUCH ULTRA TEST) test strip Check blood sugar twice a day and as directed Dx E11.59 200 each 3  . lansoprazole (PREVACID) 15 MG capsule Take 15 mg by mouth daily at 12 noon.    Marland Kitchen leuprolide (LUPRON) 30 MG injection Inject 45 mg into the muscle every 6 (six) months.    Marland Kitchen losartan (COZAAR) 25 MG tablet TAKE 1/2 TABLET BY MOUTH ONCE A DAY 15 tablet 1  . NITROSTAT 0.4 MG SL tablet Place 1 tablet (0.4 mg total) under the tongue as needed. 25 tablet 3  . potassium chloride SA  (K-DUR,KLOR-CON) 20 MEQ tablet Take 1 tablet (20 mEq total) by mouth daily as needed (Take one tabley if you take a Lasix tablet). 30 tablet 3  . UNIFINE PENTIPS 29G X 12MM MISC USE AS DIRECTED TWICE DAILY 100 each 5   No current facility-administered medications for this visit.     Allergies:   Coreg [carvedilol]; Metformin and related; Protonix [pantoprazole sodium]; Rosuvastatin calcium; and Statins    Social History:  The patient  reports that he quit smoking about 3 years ago. His smoking use included cigarettes. He has a 36.00 pack-year smoking history. he has never used smokeless tobacco. He reports that he drinks alcohol. He reports that he does not use drugs.   Family History:  The patient's family history includes Coronary artery disease in his  father and mother; Diabetes in his brother, father, and sister; Diabetes (age of onset: 24) in his mother; Heart attack in his brother, father, maternal grandfather, and mother; Heart disease in his brother, father, and mother; Hypertension in his father and mother; Prostate cancer in his paternal grandfather.    ROS:  Please see the history of present illness.   Otherwise, review of systems are positive for none.   All other systems are reviewed and negative.    PHYSICAL EXAM: VS:  BP 128/64 (BP Location: Left Arm, Patient Position: Sitting, Cuff Size: Large)   Pulse 69   Ht 5\' 9"  (1.753 m)   Wt 278 lb 4 oz (126.2 kg)   BMI 41.09 kg/m  , BMI Body mass index is 41.09 kg/m. GEN: Well nourished, well developed, in no acute distress  HEENT: normal  Neck: no JVD, carotid bruits, or masses Cardiac: RRR; no murmurs, rubs, or gallops,no edema  Respiratory:  clear to auscultation bilaterally, normal work of breathing GI: soft, nontender, nondistended, + BS MS: no deformity or atrophy  Skin: warm and dry, no rash Neuro:  Strength and sensation are intact Psych: euthymic mood, full affect   EKG:  EKG is ordered today. The ekg ordered  today demonstrates  normal sinus rhythm with old inferior infarct.   Recent Labs: 04/04/2017: ALT 23; BUN 23; Creatinine, Ser 0.99; Hemoglobin 13.7; Platelets 263.0; Potassium 4.5; Sodium 136    Lipid Panel    Component Value Date/Time   CHOL 97 (L) 09/24/2016 0855   TRIG 111 09/24/2016 0855   HDL 38 (L) 09/24/2016 0855   CHOLHDL 2.6 09/24/2016 0855   CHOLHDL 5.3 07/20/2014 0241   VLDL 22 07/20/2014 0241   LDLCALC 37 09/24/2016 0855   LDLDIRECT 157.1 09/07/2013 0910      Wt Readings from Last 3 Encounters:  09/27/17 278 lb 4 oz (126.2 kg)  09/09/17 276 lb (125.2 kg)  08/20/17 273 lb (123.8 kg)        ASSESSMENT AND PLAN:  1.  Coronary artery disease involving graft disease with other forms of angina: His symptoms are well controlled overall with no recent chest pain or significant dyspnea.  Continue indefinite dual antiplatelet therapy.  2. Peripheral arterial disease: He has bilateral leg pain that does not seem to be due to claudication.  He was evaluated by [facility].  3. Hyperlipidemia: He is intolerant to all statins and currently on Zetia .  He is no longer on Repatha which was not covered for some reasons.  I strongly feel that he needs to be on this medication due to extensive cardiovascular disease and diabetes.  We will try to get him back on this medication.  4. Essential hypertension: Blood pressure is well controlled on current medications.    Disposition:   FU with me in 9 months  Signed,  [name], MD  09/27/2017 8:58 AM    [place]

## 2017-09-27 NOTE — Patient Instructions (Addendum)
Medication Instructions:  Your physician recommends that you continue on your current medications as directed. Please refer to the Current Medication list given to you today.  We will send a new Repatha prescription to your pharmacy.   Labwork: none  Testing/Procedures: none  Follow-Up: Your physician wants you to follow-up in: 9 months with Dr. Fletcher Anon.  You will receive a reminder letter in the mail two months in advance. If you don't receive a letter, please call our office to schedule the follow-up appointment.   Any Other Special Instructions Will Be Listed Below (If Applicable).     If you need a refill on your cardiac medications before your next appointment, please call your pharmacy.

## 2017-10-01 ENCOUNTER — Other Ambulatory Visit: Payer: Self-pay | Admitting: Cardiovascular Disease

## 2017-10-19 ENCOUNTER — Other Ambulatory Visit: Payer: Self-pay | Admitting: Cardiovascular Disease

## 2017-10-19 DIAGNOSIS — I2510 Atherosclerotic heart disease of native coronary artery without angina pectoris: Secondary | ICD-10-CM

## 2017-10-19 DIAGNOSIS — R079 Chest pain, unspecified: Secondary | ICD-10-CM

## 2017-10-19 DIAGNOSIS — E785 Hyperlipidemia, unspecified: Secondary | ICD-10-CM

## 2017-10-19 DIAGNOSIS — I10 Essential (primary) hypertension: Secondary | ICD-10-CM

## 2017-10-21 ENCOUNTER — Other Ambulatory Visit: Payer: Self-pay | Admitting: Cardiovascular Disease

## 2017-10-21 DIAGNOSIS — I10 Essential (primary) hypertension: Secondary | ICD-10-CM

## 2017-10-21 DIAGNOSIS — R079 Chest pain, unspecified: Secondary | ICD-10-CM

## 2017-10-21 DIAGNOSIS — I2510 Atherosclerotic heart disease of native coronary artery without angina pectoris: Secondary | ICD-10-CM

## 2017-10-21 DIAGNOSIS — E785 Hyperlipidemia, unspecified: Secondary | ICD-10-CM

## 2017-11-13 ENCOUNTER — Encounter: Payer: Medicare Other | Admitting: Cardiothoracic Surgery

## 2017-11-21 DIAGNOSIS — C61 Malignant neoplasm of prostate: Secondary | ICD-10-CM | POA: Diagnosis not present

## 2017-12-09 ENCOUNTER — Encounter: Payer: Self-pay | Admitting: Internal Medicine

## 2017-12-09 ENCOUNTER — Ambulatory Visit (INDEPENDENT_AMBULATORY_CARE_PROVIDER_SITE_OTHER): Payer: Medicare Other | Admitting: Internal Medicine

## 2017-12-09 VITALS — BP 142/76 | HR 68 | Ht 69.0 in | Wt 270.6 lb

## 2017-12-09 DIAGNOSIS — E1159 Type 2 diabetes mellitus with other circulatory complications: Secondary | ICD-10-CM

## 2017-12-09 DIAGNOSIS — I2510 Atherosclerotic heart disease of native coronary artery without angina pectoris: Secondary | ICD-10-CM

## 2017-12-09 DIAGNOSIS — E1165 Type 2 diabetes mellitus with hyperglycemia: Secondary | ICD-10-CM

## 2017-12-09 DIAGNOSIS — Z6839 Body mass index (BMI) 39.0-39.9, adult: Secondary | ICD-10-CM | POA: Diagnosis not present

## 2017-12-09 DIAGNOSIS — E785 Hyperlipidemia, unspecified: Secondary | ICD-10-CM | POA: Diagnosis not present

## 2017-12-09 LAB — POCT GLYCOSYLATED HEMOGLOBIN (HGB A1C): Hemoglobin A1C: 7.8

## 2017-12-09 MED ORDER — INSULIN REGULAR HUMAN (CONC) 500 UNIT/ML ~~LOC~~ SOPN: 50.0000 [IU] | PEN_INJECTOR | Freq: Three times a day (TID) | SUBCUTANEOUS | 5 refills | Status: DC

## 2017-12-09 NOTE — Patient Instructions (Addendum)
Please continue: - Invokana 300 mg before b'fast - Trulicity 1.5 mg weekly  - U500 insulin: 55-70 units before meals, depending on the meal If sugars in am: - 160-200, take 10 units - 201-240, take 15 units - >240, take 20 units  Please check some sugars at bedtime.  Please return in 3 months with your sugar log.

## 2017-12-09 NOTE — Progress Notes (Signed)
Patient ID: Phillip Clements, male   DOB: 09/03/1950, 67 y.o.   MRN: [id number]  HPI: Phillip Clements is a 67 y.o.-year-old male, returning for f/u for DM2, dx in 1996, insulin-dependent since 2008, uncontrolled, with complications (CAD - s/p stents, CABG, cerebro-vascular ds - s/p CVA, PVD). Last visit 3 months ago. He changed to [name] in 10/2016.  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  He is on Lupron.  Mentions problems with weight and lack of energy.  Last hemoglobin A1c was: Lab Results  Component Value Date   HGBA1C 7.9 09/10/2017   HGBA1C 7.0 06/07/2017   HGBA1C 7.8 03/07/2017   Pt was on a regimen of: - Invokana 300 mg before b'fast - Trulicity 1.5 mg weekly - Novolog 40 units 3x a day - 15 min before meals - Toujeo 120  units (60 x2) after dinner He tried Victoza and Januvia. Had GI intolerance (nausea, diarrhea) to regular Metformin and also with metformin ER >>stopped.  At last visit, we changed to: - Invokana 300 mg before b'fast - Trulicity 1.5 mg weekly - U500 insulin: - 50 units before b'fast (if eating) - 50 units before lunch - 60 units before dinner  Pt checks his sugars 1-3x a day: - am: 139-198 >> 115-256 >> 132-239, 286 - 2h after b'fast: n/c - not eating b'fast - before lunch: 95-145 >> 98, 123-197 >> 150-212, 231 - 2h after lunch: n/c - before dinner: 115, 148 >> 136 >> n/c >> 89, 163-288 - 2h after dinner: n/c - bedtime:  166, 244-376 >> 160 >> n/c >> 201, 214 - nighttime: n/c Lowest sugar was 98 >> 89; he has hypoglycemia awareness in the 70s. Highest sugar was 256 >> 445.  Glucometer: OneTouch Ultra mini  Pt's meals are plant based for last 1 year: - Breakfast:  skips - Lunch: sandwich, chips, cottage cheese, pineapple - Dinner: 2 veggies, salads; Sat night: meat - Snacks: 2/day: celery + PB; low salt triscuit + laughing cow cheese  He walks on the treadmill 5/7 days  - 30 min, 2/7 days: lifts weight  -+ Mild CKD, last  BUN/creatinine:  Lab Results  Component Value Date   BUN 23 04/04/2017   CREATININE 0.99 04/04/2017  On losartan. - + HL; last set of lipids: Lab Results  Component Value Date   CHOL 97 (L) 09/24/2016   HDL 38 (L) 09/24/2016   LDLCALC 37 09/24/2016   LDLDIRECT 157.1 09/07/2013   TRIG 111 09/24/2016   CHOLHDL 2.6 09/24/2016  He had joint pain from statins in the past.  He was on Repatha every 2 weeks in the past, restarted since last visit.  Also on ezetimibe. - last eye exam was in 06/2016: No DR. + h/o cataract Sx - no numbness and tingling in his feet.  ROS: Constitutional: no weight gain/no weight loss, no fatigue, no subjective hyperthermia, no subjective hypothermia Eyes: no blurry vision, no xerophthalmia ENT: no sore throat, no nodules palpated in throat, no dysphagia, no odynophagia, no hoarseness Cardiovascular: no CP/no SOB/no palpitations/no leg swelling Respiratory: no cough/no SOB/no wheezing Gastrointestinal: no N/no V/no D/no C/no acid reflux Musculoskeletal: no muscle aches/no joint aches Skin: no rashes, no hair loss Neurological: no tremors/no numbness/no tingling/no dizziness  I reviewed pt's medications, allergies, PMH, social hx, family hx, and changes were documented in the history of present illness. Otherwise, unchanged from my initial visit note.  Past Medical History:  Diagnosis Date  . Cataract  bilateral eye in 2010  . Coronary artery disease    a. CABG 04/2014: (LIMA--> LAD, SVG --> DIAG, SVG--> OM1, SVG--> PDA)  b. early graft failure. s/p successful rotational atherectomy and stenting of the left main and proximal LAD with DES. DES of mid LAD.(07/22/14)  . CVA (cerebral infarction)   . Hx of adenomatous colonic polyps 06/17/2017  . Hyperlipidemia   . Hypertension 2000  . OSA on CPAP   . Peripheral vascular disease ([place])    left SFA angioplasty in December 2016  . Prostate cancer ([place])   . Sleep apnea    wears CPAP nightly  . Stroke ([place])    . Type II diabetes mellitus ([place])    Past Surgical History:  Procedure Laterality Date  . ANTERIOR CERVICAL DECOMP/DISCECTOMY FUSION  08/25/2012   Procedure: ANTERIOR CERVICAL DECOMPRESSION/DISCECTOMY FUSION 2 LEVELS;  Surgeon: Hosie Spangle, MD;  Location: [place] [facility];  Service: Neurosurgery;  Laterality: N/A;  Cervical five-six Cervical six-seven anterior cervical decompression with fusion and plating and bonegraft  . BACK SURGERY    . CARDIAC CATHETERIZATION  04/2014; 07/19/2014  . CORONARY ARTERY BYPASS GRAFT N/A 04/26/2014   Procedure: CORONARY ARTERY BYPASS GRAFTING (CABG), on pump, times four, using left internal mammary artery, right greater saphenous vein harvested endoscopically.;  Surgeon: Ivin Poot, MD;  Location: [place];  Service: Open Heart Surgery;  Laterality: N/A;  LIMA to LAD, SVG to DIAGONAL, SVG to OM1, SVG to PDA) with EVH of the RIGHT THIGH and LOWER EXTREMITY SAPHENOUS VEIN  . Cytoscopy prostatic stone o/w nml  06/21/08   Dr. Jacqlyn Larsen  . ETT myoview  09/13/09   Low risk EF 49%  . High intense focused ultrasound  07/20/06   By Dr. Jacqlyn Larsen  . INTRAOPERATIVE TRANSESOPHAGEAL ECHOCARDIOGRAM N/A 04/26/2014   Procedure: INTRAOPERATIVE TRANSESOPHAGEAL ECHOCARDIOGRAM;  Surgeon: Ivin Poot, MD;  Location: [place];  Service: Open Heart Surgery;  Laterality: N/A;  . PERCUTANEOUS CORONARY ROTOBLATOR INTERVENTION (PCI-R) N/A 07/22/2014   Procedure: PERCUTANEOUS CORONARY ROTOBLATOR INTERVENTION (PCI-R);  Surgeon: Peter M Martinique, MD;  [facility];  Service: Cardiovascular;  Laterality: N/A;  . PERIPHERAL VASCULAR CATHETERIZATION Left 07/26/2015   Procedure: Lower Extremity Angiography;  Surgeon: Katha Cabal, MD;  Location: [place] CV LAB;  Service: Cardiovascular;  Laterality: Left;  . PERIPHERAL VASCULAR CATHETERIZATION  07/26/2015   Procedure: Lower Extremity Intervention;  Surgeon: Katha Cabal, MD;  Location: [place] CV LAB;  Service: Cardiovascular;;   . PROSTATE BIOPSY  04/03/06 & 02/25/07   Social History   Socioeconomic History  . Marital status: Married    Spouse name: Not on file  . Number of children: 3  . Years of education: Not on file  . Highest education level: Not on file  Occupational History  . Occupation: Insurance account manager  Social Needs  . Financial resource strain: Not on file  . Food insecurity:    Worry: Not on file    Inability: Not on file  . Transportation needs:    Medical: Not on file    Non-medical: Not on file  Tobacco Use  . Smoking status: Former Smoker    Packs/day: 1.00    Years: 36.00    Pack years: 36.00    Types: Cigarettes    Last attempt to quit: 03/01/2014    Years since quitting: 3.7  . Smokeless tobacco: Never Used  Substance and Sexual Activity  . Alcohol use: Yes  Comment: 07/19/2014 "might have a drink a couple times/yr"  . Drug use: No  . Sexual activity: Not Currently  Lifestyle  . Physical activity:    Days per week: Not on file    Minutes per session: Not on file  . Stress: Not on file  Relationships  . Social connections:    Talks on phone: Not on file    Gets together: Not on file    Attends religious service: Not on file    Active member of club or organization: Not on file    Attends meetings of clubs or organizations: Not on file    Relationship status: Not on file  . Intimate partner violence:    Fear of current or ex partner: Not on file    Emotionally abused: Not on file    Physically abused: Not on file    Forced sexual activity: Not on file  Other Topics Concern  . Not on file  Social History Narrative   Lives with wife.   2 daughters and 1 son.   [employer]   Former offensive lineman.    Current Outpatient Medications on File Prior to Visit  Medication Sig Dispense Refill  . aspirin EC 81 MG tablet Take 1 tablet (81 mg total) by mouth daily. 90 tablet 3  . B Complex Vitamins (VITAMIN B COMPLEX  PO) Take by mouth.    . B-D ULTRAFINE III SHORT PEN 31G X 8 MM MISC USE AS DIRECTED TWICE DAILY 100 each 11  . bicalutamide (CASODEX) 50 MG tablet Take 50 mg by mouth daily.    . canagliflozin (INVOKANA) 300 MG TABS tablet Take 1 tablet with breakfast daily 30 tablet 11  . carvedilol (COREG) 3.125 MG tablet TAKE ONE TABLET BY MOUTH TWICE A DAY WITH MEALS 60 tablet 3  . clopidogrel (PLAVIX) 75 MG tablet TAKE 1 TABLET BY MOUTH DAILY 30 tablet 6  . Dexlansoprazole (DEXILANT) 30 MG capsule Take 1 capsule (30 mg total) by mouth daily. 30 capsule 11  . Dulaglutide (TRULICITY) 1.5 YI/5.0YD SOPN Inject 1.5 mg under skin weekly 4 pen 11  . Evolocumab (REPATHA) 140 MG/ML SOSY Inject 1 pen into the skin every 14 (fourteen) days. 2 Syringe 11  . ezetimibe (ZETIA) 10 MG tablet TAKE 1 TABLET BY MOUTH ONCE DAILY 30 tablet 2  . furosemide (LASIX) 40 MG tablet Take 1 tablet (40 mg total) by mouth daily as needed for fluid or edema. 30 tablet 3  . glucose blood (ONE TOUCH ULTRA TEST) test strip Check blood sugar twice a day and as directed Dx E11.59 200 each 3  . lansoprazole (PREVACID) 15 MG capsule Take 15 mg by mouth daily at 12 noon.    Marland Kitchen leuprolide (LUPRON) 30 MG injection Inject 45 mg into the muscle every 6 (six) months.    Marland Kitchen losartan (COZAAR) 25 MG tablet TAKE 1/2 TABLET BY MOUTH ONCE DAILY 15 tablet 1  . NITROSTAT 0.4 MG SL tablet Place 1 tablet (0.4 mg total) under the tongue as needed. 25 tablet 3  . potassium chloride SA (K-DUR,KLOR-CON) 20 MEQ tablet Take 1 tablet (20 mEq total) by mouth daily as needed (Take one tabley if you take a Lasix tablet). 30 tablet 3  . UNIFINE PENTIPS 29G X 12MM MISC USE AS DIRECTED TWICE DAILY 100 each 5   No current facility-administered medications on file prior to visit.    Allergies  Allergen Reactions  . Coreg [Carvedilol] Other (  See Comments)    Fatigue- unclear if this was due to coreg specifically or beta blockers in general.    . Metformin And Related Nausea  Only  . Protonix [Pantoprazole Sodium] Diarrhea  . Rosuvastatin Calcium     REACTION: JOINT ACHES  . Statins     REACTION: JOINT ACHES   Family History  Problem Relation Age of Onset  . Diabetes Mother 41       DM  . Coronary artery disease Mother   . Hypertension Mother   . Heart attack Mother        CABG with MI in 2005  . Heart disease Mother        CV  . Hypertension Father   . Heart attack Father        CABG with Mi around 1997  . Coronary artery disease Father   . Heart disease Father        CV  . Diabetes Father   . Heart attack Brother        MI/PTCA  . Heart disease Brother        CV  . Diabetes Brother   . Heart attack Maternal Grandfather        MI  . Prostate cancer Paternal Grandfather   . Diabetes Sister        DM  . Breast cancer Neg Hx        Breast/ovarian/uterine cancer  . Colon cancer Neg Hx   . Depression Neg Hx   . Alcohol abuse Neg Hx        ETOH/drug abuse  . Stroke Neg Hx    PE: BP (!) 142/76   Pulse 68   Ht 5\' 9"  (1.753 m)   Wt 270 lb 9.6 oz (122.7 kg)   SpO2 97%   BMI 39.96 kg/m  Body mass index is 39.96 kg/m. Wt Readings from Last 3 Encounters:  12/09/17 270 lb 9.6 oz (122.7 kg)  09/27/17 278 lb 4 oz (126.2 kg)  09/09/17 276 lb (125.2 kg)   Constitutional: overweight, in NAD Eyes: PERRLA, EOMI, no exophthalmos ENT: moist mucous membranes, no thyromegaly, no cervical lymphadenopathy Cardiovascular: RRR, No MRG Respiratory: CTA B Gastrointestinal: abdomen soft, NT, ND, BS+ Musculoskeletal: no deformities, strength intact in all 4 Skin: moist, warm, no rashes, + stasis dermatitis bilateral legs Neurological: no tremor with outstretched hands, DTR normal in all 4  ASSESSMENT: 1. DM2, insulin-dependent, uncontrolled, with complications - CAD - s/p stents, CABG - cerebro-vascular ds - s/p CVA  - PVD - s/p L stent  2. HL  3.  Obesity class II BMI Classification:  < 18.5 underweight   18.5-24.9 normal weight    25.0-29.9 overweight   30.0-34.9 class I obesity   35.0-39.9 class II obesity   ? 40.0 class III obesity   PLAN:  1. Patient with long-standing, uncontrolled, type 2 diabetes, very insulin resistant, now on U500 insulin, started at last visit.  At last visit, we discussed about improving diet.  He had more dietary indiscretions over the holidays and an HbA1c was 7.9% at last visit. - At this visit, his sugars continue to be quite variable.  He is not taking U500 in the morning since he is not eating.  However, his highest sugars are fasting, in a.m., and I suspect that these are due to hypoglycemia after dinner.  He is not checking sugars usually after dinner and I advised him to do so.  His dinner is usually late.  We will increase the dose of his U500 insulin with dinner and I advised him to increase the dose with lunch, also, if he has a larger meal.  If his sugars are higher in the morning, I gave him a sliding scale, since he probably needs to take some insulin, but not the full mealtime dose - I suggested to: Patient Instructions  Please continue: - Invokana 300 mg before b'fast - Trulicity 1.5 mg weekly  - U500 insulin: 55-70 units before meals, depending on the meal If sugars in am: - 160-200, take 10 units - 201-240, take 15 units - >240, take 20 units  Please check some sugars at bedtime.  Please return in 3 months with your sugar log.   - today, HbA1c is 7.8% (slightly better) - continue checking sugars at different times of the day - check 3x a day, rotating checks - advised for yearly eye exams >> he is UTD - Return to clinic in 3 mo with sugar log   2. HL -He is intolerant to statins -He is back on Repatha.  Previously not covered. -Also continues on ezetimibe without side effects -Reviewed latest lipid panel from 09/2016: At goal , Except low HDL -he is due for another lipid panel  3. Obesity - Contributed also by his Lupron injections - He did lose 8  pounds since last visit despite starting U500 insulin, mainly due to dietary changes (cut down portions)  Philemon Kingdom, MD PhD [facility]

## 2017-12-11 ENCOUNTER — Encounter: Payer: Self-pay | Admitting: Cardiothoracic Surgery

## 2017-12-11 ENCOUNTER — Ambulatory Visit (INDEPENDENT_AMBULATORY_CARE_PROVIDER_SITE_OTHER): Payer: Medicare Other | Admitting: Cardiothoracic Surgery

## 2017-12-11 ENCOUNTER — Other Ambulatory Visit: Payer: Self-pay

## 2017-12-11 VITALS — BP 137/67 | HR 74 | Resp 16 | Ht 69.0 in | Wt 270.0 lb

## 2017-12-11 DIAGNOSIS — I739 Peripheral vascular disease, unspecified: Secondary | ICD-10-CM | POA: Diagnosis not present

## 2017-12-11 DIAGNOSIS — I2510 Atherosclerotic heart disease of native coronary artery without angina pectoris: Secondary | ICD-10-CM

## 2017-12-11 DIAGNOSIS — Z951 Presence of aortocoronary bypass graft: Secondary | ICD-10-CM

## 2017-12-11 NOTE — Progress Notes (Signed)
PCP is Tonia Ghent, MD Referring Provider is Wellington Hampshire, MD  Chief Complaint  Patient presents with  . Follow-up    6 month after referral to [facility] fo bilateral claudication...saw [facility]...discomfort related to disc disease, will see in 1 yr with repeat ABI'S    HPI: Routine scheduled six-month follow-up for coronary artery disease status post multivessel CABG in 2015.  Patient with severe diabetic disease and graft closure of saphenous vein graft.  Patient had PCI by Dr. Sophronia Simas and has done well since that time.  Last echo demonstrates EF 50-55%.  Main complaints are leg pain and back pain from spinal stenosis.  No symptoms of CHF.  Last A1c 7.8.  Not complaining of arm pain which is his anginal equivalent.  Patient receiving twice a month Repatha for lipid control   Past Medical History:  Diagnosis Date  . Cataract    bilateral eye in 2010  . Coronary artery disease    a. CABG 04/2014: (LIMA--> LAD, SVG --> DIAG, SVG--> OM1, SVG--> PDA)  b. early graft failure. s/p successful rotational atherectomy and stenting of the left main and proximal LAD with DES. DES of mid LAD.(07/22/14)  . CVA (cerebral infarction)   . Hx of adenomatous colonic polyps 06/17/2017  . Hyperlipidemia   . Hypertension 2000  . OSA on CPAP   . Peripheral vascular disease ([place])    left SFA angioplasty in December 2016  . Prostate cancer ([place])   . Sleep apnea    wears CPAP nightly  . Stroke ([place])   . Type II diabetes mellitus ([place])     Past Surgical History:  Procedure Laterality Date  . ANTERIOR CERVICAL DECOMP/DISCECTOMY FUSION  08/25/2012   Procedure: ANTERIOR CERVICAL DECOMPRESSION/DISCECTOMY FUSION 2 LEVELS;  Surgeon: Hosie Spangle, MD;  Location: [place] NEUR[facility] ORS;  Service: Neurosurgery;  Laterality: N/A;  Cervical five-six Cervical six-seven anterior cervical decompression with fusion and plating and bonegraft  . BACK SURGERY    . CARDIAC CATHETERIZATION  04/2014; 07/19/2014  . CORONARY ARTERY BYPASS  GRAFT N/A 04/26/2014   Procedure: CORONARY ARTERY BYPASS GRAFTING (CABG), on pump, times four, using left internal mammary artery, right greater saphenous vein harvested endoscopically.;  Surgeon: Ivin Poot, MD;  Location: [place];  Service: Open Heart Surgery;  Laterality: N/A;  LIMA to LAD, SVG to DIAGONAL, SVG to OM1, SVG to PDA) with EVH of the RIGHT THIGH and LOWER EXTREMITY SAPHENOUS VEIN  . Cytoscopy prostatic stone o/w nml  06/21/08   Dr. Jacqlyn Larsen  . ETT myoview  09/13/09   Low risk EF 49%  . High intense focused ultrasound  07/20/06   By Dr. Jacqlyn Larsen  . INTRAOPERATIVE TRANSESOPHAGEAL ECHOCARDIOGRAM N/A 04/26/2014   Procedure: INTRAOPERATIVE TRANSESOPHAGEAL ECHOCARDIOGRAM;  Surgeon: Ivin Poot, MD;  Location: [place];  Service: Open Heart Surgery;  Laterality: N/A;  . PERCUTANEOUS CORONARY ROTOBLATOR INTERVENTION (PCI-R) N/A 07/22/2014   Procedure: PERCUTANEOUS CORONARY ROTOBLATOR INTERVENTION (PCI-R);  Surgeon: Peter M Martinique, MD;  Location: [facility] CATH LAB;  Service: Cardiovascular;  Laterality: N/A;  . PERIPHERAL VASCULAR CATHETERIZATION Left 07/26/2015   Procedure: Lower Extremity Angiography;  Surgeon: Katha Cabal, MD;  Location: [place] CV LAB;  Service: Cardiovascular;  Laterality: Left;  . PERIPHERAL VASCULAR CATHETERIZATION  07/26/2015   Procedure: Lower Extremity Intervention;  Surgeon: Katha Cabal, MD;  Location: [place] CV LAB;  Service: Cardiovascular;;  . PROSTATE BIOPSY  04/03/06 & 02/25/07    Family History  Problem Relation Age of  Onset  . Diabetes Mother 1       DM  . Coronary artery disease Mother   . Hypertension Mother   . Heart attack Mother        CABG with MI in 2005  . Heart disease Mother        CV  . Hypertension Father   . Heart attack Father        CABG with Mi around 1997  . Coronary artery disease Father   . Heart disease Father        CV  . Diabetes Father   . Heart attack Brother        MI/PTCA  . Heart disease Brother         CV  . Diabetes Brother   . Heart attack Maternal Grandfather        MI  . Prostate cancer Paternal Grandfather   . Diabetes Sister        DM  . Breast cancer Neg Hx        Breast/ovarian/uterine cancer  . Colon cancer Neg Hx   . Depression Neg Hx   . Alcohol abuse Neg Hx        ETOH/drug abuse  . Stroke Neg Hx     Social History Social History   Tobacco Use  . Smoking status: Former Smoker    Packs/day: 1.00    Years: 36.00    Pack years: 36.00    Types: Cigarettes    Last attempt to quit: 03/01/2014    Years since quitting: 3.7  . Smokeless tobacco: Never Used  Substance Use Topics  . Alcohol use: Yes    Comment: 07/19/2014 "might have a drink a couple times/yr"  . Drug use: No    Current Outpatient Medications  Medication Sig Dispense Refill  . aspirin EC 81 MG tablet Take 1 tablet (81 mg total) by mouth daily. 90 tablet 3  . B Complex Vitamins (VITAMIN B COMPLEX PO) Take by mouth.    . B-D ULTRAFINE III SHORT PEN 31G X 8 MM MISC USE AS DIRECTED TWICE DAILY 100 each 11  . bicalutamide (CASODEX) 50 MG tablet Take 50 mg by mouth daily.    . canagliflozin (INVOKANA) 300 MG TABS tablet Take 1 tablet with breakfast daily 30 tablet 11  . carvedilol (COREG) 3.125 MG tablet TAKE ONE TABLET BY MOUTH TWICE A DAY WITH MEALS 60 tablet 3  . clopidogrel (PLAVIX) 75 MG tablet TAKE 1 TABLET BY MOUTH DAILY 30 tablet 6  . Dulaglutide (TRULICITY) 1.5 BT/5.9RC SOPN Inject 1.5 mg under skin weekly 4 pen 11  . Evolocumab (REPATHA) 140 MG/ML SOSY Inject 1 pen into the skin every 14 (fourteen) days. 2 Syringe 11  . ezetimibe (ZETIA) 10 MG tablet TAKE 1 TABLET BY MOUTH ONCE DAILY 30 tablet 2  . glucose blood (ONE TOUCH ULTRA TEST) test strip Check blood sugar twice a day and as directed Dx E11.59 200 each 3  . insulin regular human CONCENTRATED (HUMULIN R U-500 KWIKPEN) 500 UNIT/ML kwikpen Inject 50-80 Units into the skin 3 (three) times daily with meals. 6 pen 5  . lansoprazole (PREVACID) 15  MG capsule Take 15 mg by mouth daily at 12 noon.    Marland Kitchen leuprolide (LUPRON) 30 MG injection Inject 45 mg into the muscle every 6 (six) months.    Marland Kitchen losartan (COZAAR) 25 MG tablet TAKE 1/2 TABLET BY MOUTH ONCE DAILY 15 tablet 1  . NITROSTAT 0.4 MG SL tablet  Place 1 tablet (0.4 mg total) under the tongue as needed. 25 tablet 3  . UNIFINE PENTIPS 29G X 12MM MISC USE AS DIRECTED TWICE DAILY 100 each 5   No current facility-administered medications for this visit.     Allergies  Allergen Reactions  . Coreg [Carvedilol] Other (See Comments)    Fatigue- unclear if this was due to coreg specifically or beta blockers in general.    . Metformin And Related Nausea Only  . Protonix [Pantoprazole Sodium] Diarrhea  . Rosuvastatin Calcium     REACTION: JOINT ACHES  . Statins     REACTION: JOINT ACHES    Review of Systems  Unable to lose weight Difficulty with ambulation because of leg and back pain  BP 137/67 (BP Location: Left Arm, Patient Position: Sitting, Cuff Size: Large)   Pulse 74   Resp 16   Ht 5\' 9"  (1.753 m)   Wt 270 lb (122.5 kg)   SpO2 98% Comment: ON RA  BMI 39.87 kg/m  Physical Exam Obese middle-aged Caucasian male no acute distress  Breath sounds clear  heart rhythm regular without murmur or gallop   Diagnostic Tests:  None Impression: Stable coronary disease managed medically and with successful PCI Patient has received excellent care from Dr. Fletcher Anon and future follow-up has been arranged.  No surgical issues currently. Plan: Patient will return here as needed.   Len Childs, MD [facility] [phone]

## 2017-12-31 ENCOUNTER — Other Ambulatory Visit: Payer: Self-pay | Admitting: Cardiovascular Disease

## 2017-12-31 DIAGNOSIS — I2510 Atherosclerotic heart disease of native coronary artery without angina pectoris: Secondary | ICD-10-CM

## 2017-12-31 DIAGNOSIS — R079 Chest pain, unspecified: Secondary | ICD-10-CM

## 2017-12-31 DIAGNOSIS — I10 Essential (primary) hypertension: Secondary | ICD-10-CM

## 2017-12-31 DIAGNOSIS — E785 Hyperlipidemia, unspecified: Secondary | ICD-10-CM

## 2018-02-09 IMAGING — DX DG LUMBAR SPINE COMPLETE 4+V
5 series · 5 of 5 positions shown · non-contrast
Comparison: None.

CLINICAL DATA: Pain

EXAM:
LUMBAR SPINE - COMPLETE 4+ VIEW

[l-spine ap]
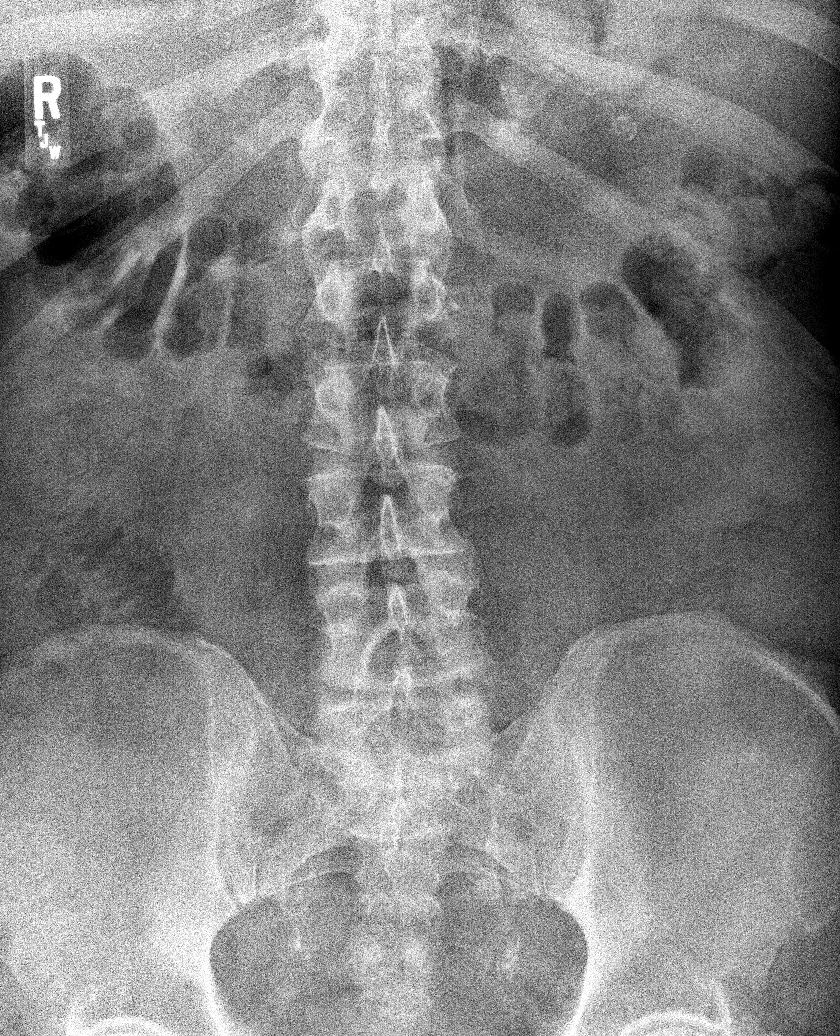

[l-spine obl (1 of 2)]
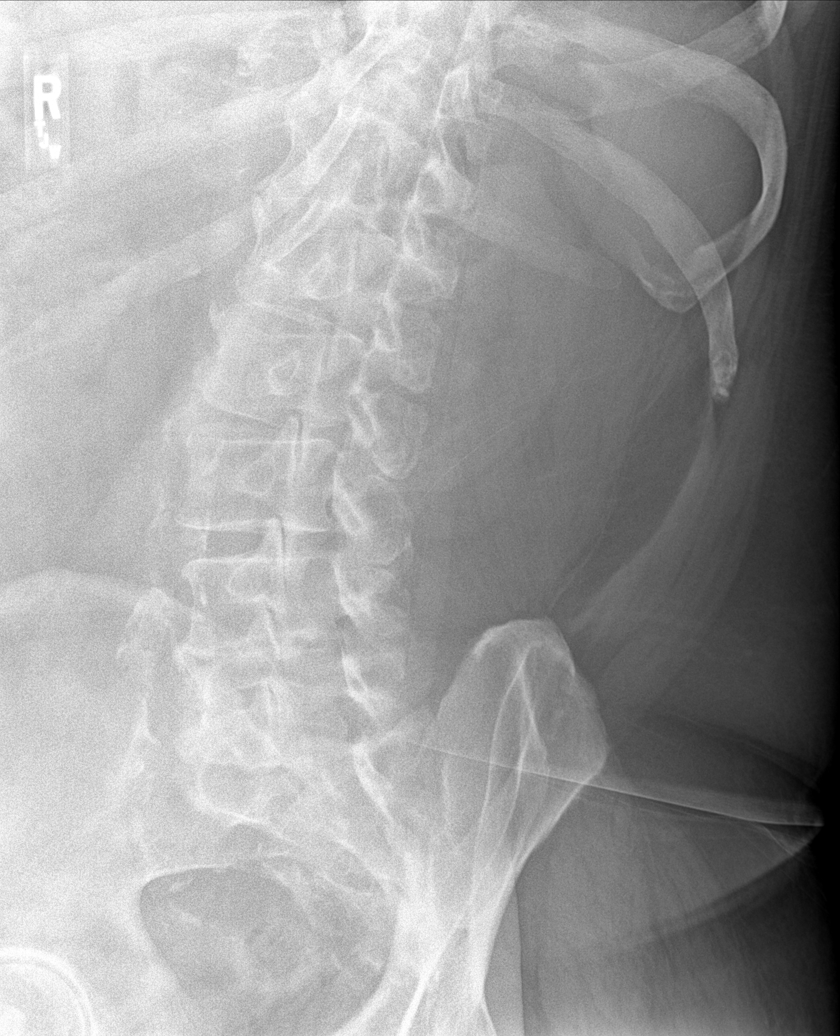

[l-spine obl (2 of 2)]
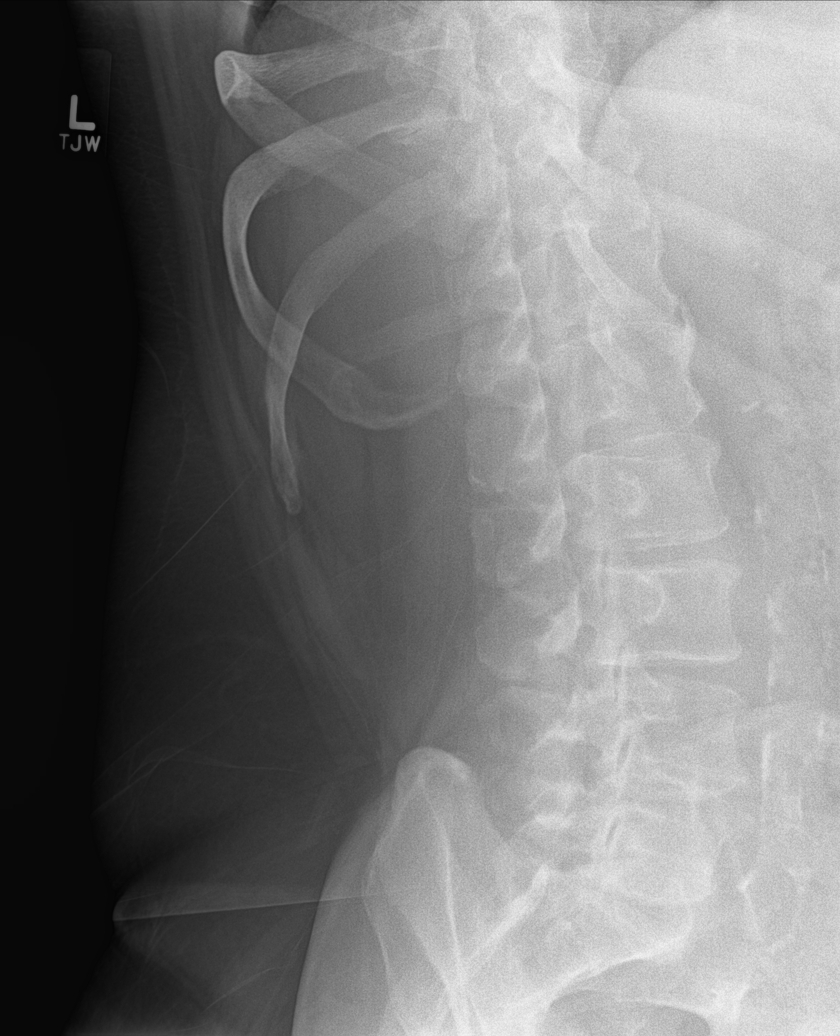

[l-spine lat]
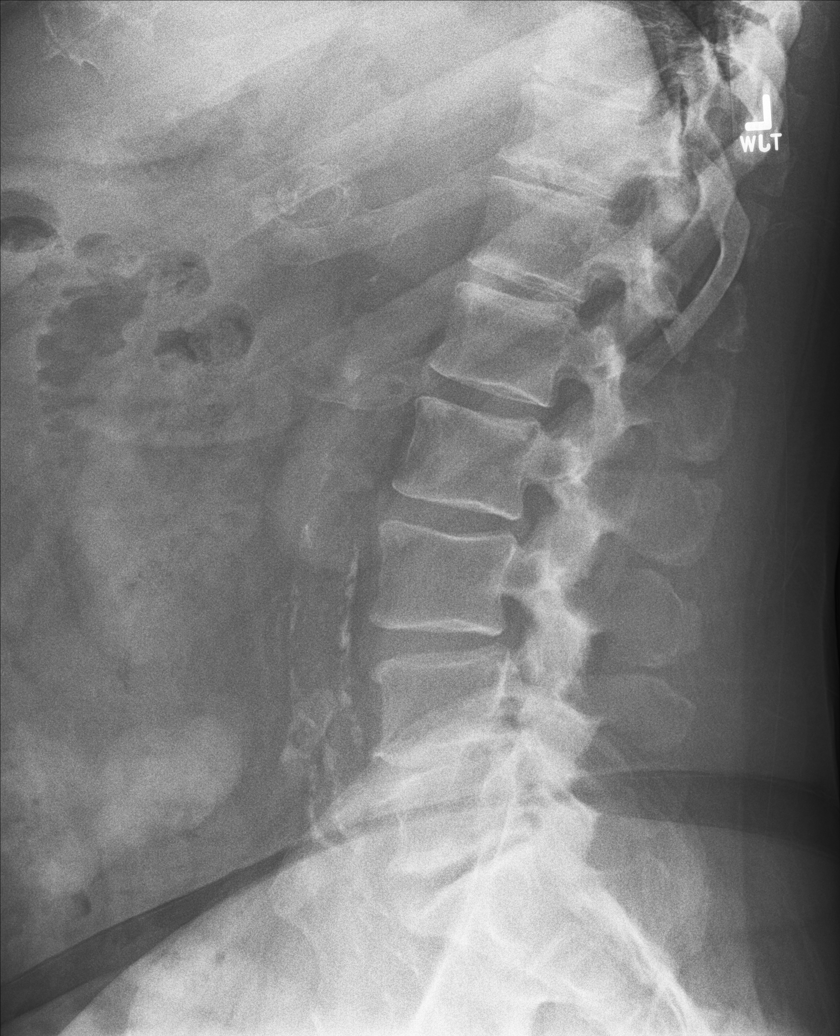

[l-spine l5/s1]
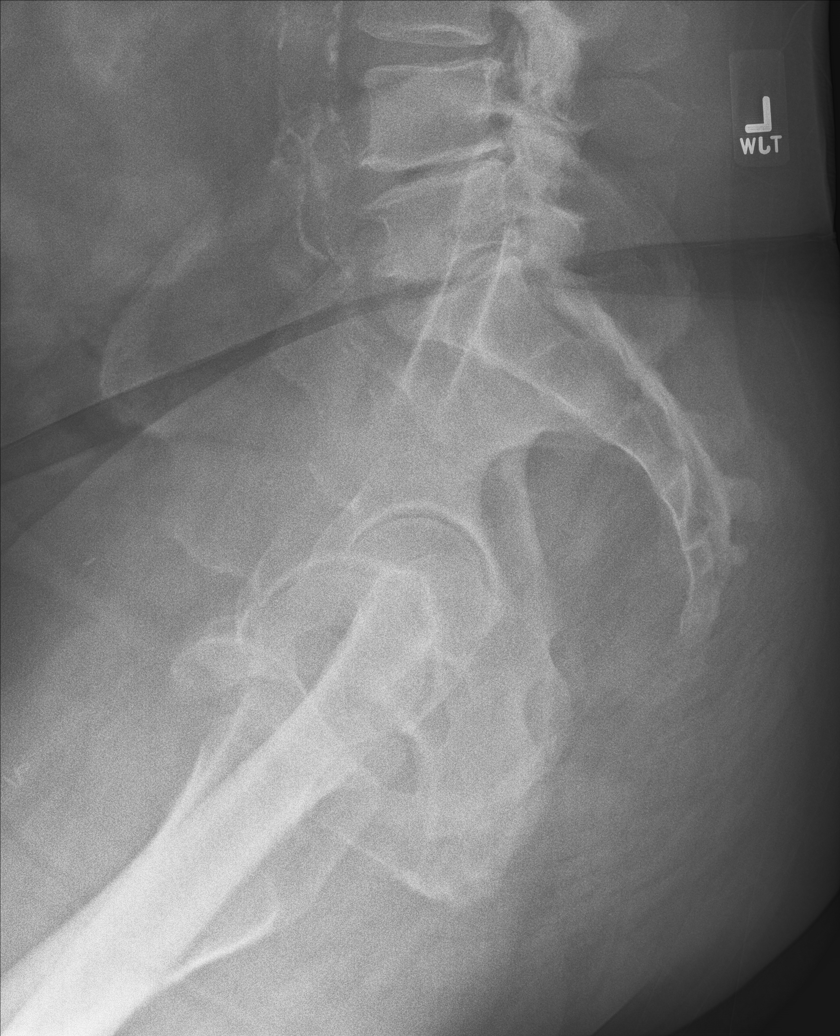

[5 of 5 positions shown; findings below may reference images not displayed]

FINDINGS: Frontal, lateral, spot lumbosacral lateral, and bilateral oblique
views were obtained. There are 5 non-rib-bearing lumbar type
vertebral bodies. There is mild thoracolumbar dextroscoliosis. There
is no fracture or spondylolisthesis. There is marked disc space
narrowing at L4-5 with moderately severe disc space narrowing at
L5-S1. Other disc spaces appear unremarkable. There is facet
osteoarthritic change at L3-4, L4-5, and L5-S1 bilaterally. There is
aortoiliac atherosclerosis.
IMPRESSION: Osteoarthritic change, most notable at L4-5 and to a slightly lesser
degree at L5-S1. No fracture or spondylolisthesis. Mild scoliosis.
There is aortoiliac atherosclerosis.

Aortic Atherosclerosis (DYMVB-EU8.8).

## 2018-02-19 DIAGNOSIS — C61 Malignant neoplasm of prostate: Secondary | ICD-10-CM | POA: Diagnosis not present

## 2018-02-19 DIAGNOSIS — Z79818 Long term (current) use of other agents affecting estrogen receptors and estrogen levels: Secondary | ICD-10-CM | POA: Diagnosis not present

## 2018-03-03 ENCOUNTER — Other Ambulatory Visit: Payer: Self-pay | Admitting: Cardiovascular Disease

## 2018-03-03 DIAGNOSIS — R079 Chest pain, unspecified: Secondary | ICD-10-CM

## 2018-03-03 DIAGNOSIS — I10 Essential (primary) hypertension: Secondary | ICD-10-CM

## 2018-03-03 DIAGNOSIS — E785 Hyperlipidemia, unspecified: Secondary | ICD-10-CM

## 2018-03-03 DIAGNOSIS — I2510 Atherosclerotic heart disease of native coronary artery without angina pectoris: Secondary | ICD-10-CM

## 2018-03-19 ENCOUNTER — Other Ambulatory Visit: Payer: Self-pay | Admitting: Internal Medicine

## 2018-03-20 ENCOUNTER — Encounter: Payer: Self-pay | Admitting: Urology

## 2018-03-20 ENCOUNTER — Ambulatory Visit (INDEPENDENT_AMBULATORY_CARE_PROVIDER_SITE_OTHER): Payer: Medicare Other | Admitting: Urology

## 2018-03-20 VITALS — BP 115/68 | HR 66 | Ht 69.0 in | Wt 244.6 lb

## 2018-03-20 DIAGNOSIS — C61 Malignant neoplasm of prostate: Secondary | ICD-10-CM | POA: Diagnosis not present

## 2018-03-20 NOTE — Progress Notes (Signed)
03/20/2018 9:56 PM   Phillip Clements 12-31-1950 [id number]  Referring provider: Tonia Ghent, MD [address] [place], [place] [address]  Chief Complaint  Patient presents with  . Establish Care   Urologic history: 1. Prostate adenocarcinoma  a. 02/2006 PSA returned elevated at 17.49  b. 04/03/2006 f/u PSA 24.7 and prostate biopsy pursued; Gleason 3+3 disease with 4/4 cores positive for disease; LVI not reported  c. patient [place] late [place]  d. persistent PSA elevation over period 2007-2010; nadir 2.1 09/16/06, peak 15.0 ng/mL  e. 04/2006 bone scan and CT abd/pelvis without evidence of metastatic spread of prostate cancer  f. 03/08/2007 CT abdomen/pelvis with no definitive metastatic disease noted  g. 03/18/2007 prostascint scan with no evidence of disease but small pelvic lymph nodes noted  h. 08/15/2009 prostascint scan with no evidence of local prostate cancer or distant metastases  i. 08/22/2009 PSA 15 ng/mL; 02/06/2010 PSA 20.5 ng/mL  j. 03/08/2010 PSA 20.9 ng/mL  k. 06/02/10 initiation of degarelix  l. 05/25/10 PET CT (Fluoride) revealed heterogenous areas of FDG-avidity in ribs, femurs, humeri, areas also in the metatarsals noted which may have been arthiritic in nature  m. 07/03/10 PSA 9.6 ng/mL   HPI: 67 year old male presents to establish local urologic care for prostate cancer.  He has been followed by Dr. Jacqlyn Larsen for greater than 10 years for prostate cancer.  He underwent a prostate biopsy in 2007 for PSA of 24.7 with pathology showing Gleason 3+3 adenocarcinoma in 4 cores.  CT and bone scan were negative for metastatic disease and he elected HIFU in [place] in 2007.  His PSA nadir was 2.1 in February 2008 and subsequently began to rise.  He had a negative ProstaScint scan and prostate biopsy.  His PSA rose to 25 in 2011 and a PET/CT (NaF) was performed which showed multiple areas of bony metastasis.  He was started on diuretics and  subsequently transitioned to Lupron/bicalutamide.  A second opinion was obtained by a medical oncology at [facility] with recommendations to continue androgen deprivation therapy.  After starting ADT his PSA remained undetectable at <0.1 until January 2019 when it was 0.14.  A repeat PSA in April 2019 was 0.18.  He has not had a PSA since April 2019.  His last Lupron injection was on 02/19/2018.  He has no bothersome lower urinary tract symptoms.  He does have nocturia x2.  Denies dysuria or gross hematuria.  Denies flank, abdominal, pelvic or scrotal pain.   PMH: Past Medical History:  Diagnosis Date  . Cataract    bilateral eye in 2010  . Coronary artery disease    a. CABG 04/2014: (LIMA--> LAD, SVG --> DIAG, SVG--> OM1, SVG--> PDA)  b. early graft failure. s/p successful rotational atherectomy and stenting of the left main and proximal LAD with DES. DES of mid LAD.(07/22/14)  . CVA (cerebral infarction)   . Hx of adenomatous colonic polyps 06/17/2017  . Hyperlipidemia   . Hypertension 2000  . OSA on CPAP   . Peripheral vascular disease ([place])    left SFA angioplasty in December 2016  . Prostate cancer ([place])   . Sleep apnea    wears CPAP nightly  . Stroke ([place])   . Type II diabetes mellitus ([place])     Surgical History: Past Surgical History:  Procedure Laterality Date  . ANTERIOR CERVICAL DECOMP/DISCECTOMY FUSION  08/25/2012   Procedure: ANTERIOR CERVICAL DECOMPRESSION/DISCECTOMY FUSION 2 LEVELS;  Surgeon: Hosie Spangle,  MD;  Location: [facility];  Service: Neurosurgery;  Laterality: N/A;  Cervical five-six Cervical six-seven anterior cervical decompression with fusion and plating and bonegraft  . BACK SURGERY    . CARDIAC CATHETERIZATION  04/2014; 07/19/2014  . CORONARY ARTERY BYPASS GRAFT N/A 04/26/2014   Procedure: CORONARY ARTERY BYPASS GRAFTING (CABG), on pump, times four, using left internal mammary artery, right greater saphenous vein harvested endoscopically.;  Surgeon: Ivin Poot, MD;  Location: [facility];  Service: Open Heart Surgery;  Laterality: N/A;  LIMA to LAD, SVG to DIAGONAL, SVG to OM1, SVG to PDA) with EVH of the RIGHT THIGH and LOWER EXTREMITY SAPHENOUS VEIN  . Cytoscopy prostatic stone o/w nml  06/21/08   Dr. Jacqlyn Larsen  . ETT myoview  09/13/09   Low risk EF 49%  . High intense focused ultrasound  07/20/06   By Dr. Jacqlyn Larsen  . INTRAOPERATIVE TRANSESOPHAGEAL ECHOCARDIOGRAM N/A 04/26/2014   Procedure: INTRAOPERATIVE TRANSESOPHAGEAL ECHOCARDIOGRAM;  Surgeon: Ivin Poot, MD;  Location: [facility];  Service: Open Heart Surgery;  Laterality: N/A;  . PERCUTANEOUS CORONARY ROTOBLATOR INTERVENTION (PCI-R) N/A 07/22/2014   Procedure: PERCUTANEOUS CORONARY ROTOBLATOR INTERVENTION (PCI-R);  Surgeon: Peter M Martinique, MD;  Location: [facility] CATH LAB;  Service: Cardiovascular;  Laterality: N/A;  . PERIPHERAL VASCULAR CATHETERIZATION Left 07/26/2015   Procedure: Lower Extremity Angiography;  Surgeon: Katha Cabal, MD;  Location: [facility] CV LAB;  Service: Cardiovascular;  Laterality: Left;  . PERIPHERAL VASCULAR CATHETERIZATION  07/26/2015   Procedure: Lower Extremity Intervention;  Surgeon: Katha Cabal, MD;  Location: [facility] CV LAB;  Service: Cardiovascular;;  . PROSTATE BIOPSY  04/03/06 & 02/25/07    Home Medications:  Allergies as of 03/20/2018      Reactions   Coreg [carvedilol] Other (See Comments)   Fatigue- unclear if this was due to coreg specifically or beta blockers in general.     Metformin And Related Nausea Only   Protonix [pantoprazole Sodium] Diarrhea   Rosuvastatin Calcium    REACTION: JOINT ACHES   Statins    REACTION: JOINT ACHES      Medication List        Accurate as of 03/20/18  9:56 PM. Always use your most recent med list.          aspirin EC 81 MG tablet Take 1 tablet (81 mg total) by mouth daily.   B-D ULTRAFINE III SHORT PEN 31G X 8 MM Misc Generic drug:  Insulin Pen Needle USE AS DIRECTED TWICE DAILY   UNIFINE PENTIPS 29G X  12MM Misc Generic drug:  Insulin Pen Needle USE AS DIRECTED TWICE DAILY   bicalutamide 50 MG tablet Commonly known as:  CASODEX Take 50 mg by mouth daily.   carvedilol 3.125 MG tablet Commonly known as:  COREG TAKE 1 TABLET BY MOUTH TWICE A DAY WITH MEALS   clopidogrel 75 MG tablet Commonly known as:  PLAVIX TAKE 1 TABLET BY MOUTH DAILY   CORNWALL METAL PIPETTING Misc Frequency:PHARMDIR   Dosage:0.0     Instructions:  Note:diagnosis 250.02 Dose: NA   Dulaglutide 1.5 MG/0.5ML Sopn Inject 1.5 mg under skin weekly   Evolocumab 140 MG/ML Sosy Inject 1 pen into the skin every 14 (fourteen) days.   ezetimibe 10 MG tablet Commonly known as:  ZETIA TAKE 1 TABLET BY MOUTH ONCE A DAY   glucose blood test strip Check blood sugar twice a day and as directed Dx E11.59   insulin regular human CONCENTRATED 500 UNIT/ML kwikpen Commonly known  as:  HUMULIN R Inject 50-80 Units into the skin 3 (three) times daily with meals.   INVOKANA 300 MG Tabs tablet Generic drug:  canagliflozin TAKE 1 TABLET BY MOUTH ONCE DAILY WITH BREAKFAST   lansoprazole 15 MG capsule Commonly known as:  PREVACID Take 15 mg by mouth daily at 12 noon.   leuprolide 30 MG injection Commonly known as:  LUPRON Inject 45 mg into the muscle every 6 (six) months.   losartan 25 MG tablet Commonly known as:  COZAAR TAKE 1/2 TABLET BY MOUTH ONCE A DAY   NITROSTAT 0.4 MG SL tablet Generic drug:  nitroGLYCERIN Place 1 tablet (0.4 mg total) under the tongue as needed.   NOVOLOG FLEXPEN 100 UNIT/ML FlexPen Generic drug:  insulin aspart   VICTOZA 18 MG/3ML Sopn Generic drug:  liraglutide Frequency:PHARMDIR   Dosage:0.0     Instructions:  Note:1.8 mg sq daily Dose: 0.6MG /0.1   VITAMIN B COMPLEX PO Take by mouth.       Allergies:  Allergies  Allergen Reactions  . Coreg [Carvedilol] Other (See Comments)    Fatigue- unclear if this was due to coreg specifically or beta blockers in general.    . Metformin  And Related Nausea Only  . Protonix [Pantoprazole Sodium] Diarrhea  . Rosuvastatin Calcium     REACTION: JOINT ACHES  . Statins     REACTION: JOINT ACHES    Family History: Family History  Problem Relation Age of Onset  . Diabetes Mother 27       DM  . Coronary artery disease Mother   . Hypertension Mother   . Heart attack Mother        CABG with MI in 2005  . Heart disease Mother        CV  . Hypertension Father   . Heart attack Father        CABG with Mi around 1997  . Coronary artery disease Father   . Heart disease Father        CV  . Diabetes Father   . Heart attack Brother        MI/PTCA  . Heart disease Brother        CV  . Diabetes Brother   . Heart attack Maternal Grandfather        MI  . Prostate cancer Paternal Grandfather   . Diabetes Sister        DM  . Breast cancer Neg Hx        Breast/ovarian/uterine cancer  . Colon cancer Neg Hx   . Depression Neg Hx   . Alcohol abuse Neg Hx        ETOH/drug abuse  . Stroke Neg Hx     Social History:  reports that he quit smoking about 4 years ago. His smoking use included cigarettes. He has a 36.00 pack-year smoking history. He has never used smokeless tobacco. He reports that he drinks alcohol. He reports that he does not use drugs.  ROS: UROLOGY Frequent Urination?: No Hard to postpone urination?: No Burning/pain with urination?: No Get up at night to urinate?: Yes Leakage of urine?: Yes Urine stream starts and stops?: No Trouble starting stream?: No Do you have to strain to urinate?: No Blood in urine?: No Urinary tract infection?: No Sexually transmitted disease?: No Injury to kidneys or bladder?: No Painful intercourse?: No Weak stream?: No Erection problems?: Yes Penile pain?: No  Gastrointestinal Nausea?: No Vomiting?: No Indigestion/heartburn?: No Diarrhea?: No Constipation?: No  Constitutional  Fever: No Night sweats?: No Weight loss?: Yes Fatigue?: Yes  Skin Skin rash/lesions?:  No Itching?: No  Eyes Blurred vision?: No Double vision?: No  Ears/Nose/Throat Sore throat?: No Sinus problems?: No  Hematologic/Lymphatic Swollen glands?: No Easy bruising?: Yes  Cardiovascular Leg swelling?: No Chest pain?: No  Respiratory Cough?: No Shortness of breath?: No  Endocrine Excessive thirst?: No  Musculoskeletal Back pain?: Yes Joint pain?: No  Neurological Headaches?: Yes Dizziness?: No  Psychologic Depression?: No Anxiety?: No  Physical Exam: BP 115/68 (BP Location: Left Arm, Patient Position: Sitting, Cuff Size: Large)   Pulse 66   Ht 5\' 9"  (1.443 m)   Wt 244 lb 9.6 oz (110.9 kg)   BMI 36.12 kg/m   Constitutional:  Alert and oriented, No acute distress. HEENT: [place] AT, moist mucus membranes.  Trachea midline, no masses. Cardiovascular: No clubbing, cyanosis, or edema. Respiratory: Normal respiratory effort, no increased work of breathing. GI: Abdomen is soft, nontender, nondistended, no abdominal masses GU: No CVA tenderness, prostate small ~ 15 grams, slightly firm. Lymph: No cervical or inguinal lymphadenopathy. Skin: No rashes, bruises or suspicious lesions. Neurologic: Grossly intact, no focal deficits, moving all 4 extremities. Psychiatric: Normal mood and affect.   Assessment & Plan:   67 year old male with metastatic prostate cancer diagnosed in 2011 which has been hormone sensitive since that time.  Recent PSA has become detectable.  A PSA was drawn today and he will be notified with the results and further recommendations.  Potential treatment options were discussed when he becomes castrate resistant including enzalutamide and chemotherapy.    Abbie Sons, [facility] [address], [place], [place] [address] [phone]

## 2018-03-21 LAB — PSA: Prostate Specific Ag, Serum: <0.1 ng/mL (ref 0.0–4.0)

## 2018-03-24 ENCOUNTER — Encounter: Payer: Self-pay | Admitting: Urology

## 2018-04-02 ENCOUNTER — Telehealth: Payer: Self-pay | Admitting: Urology

## 2018-04-02 NOTE — Telephone Encounter (Signed)
Patient stated that he got his last Lupron injection on 02-19-2017 and wants to know when he needs to get his next one here?   Sharyn Lull

## 2018-04-02 NOTE — Telephone Encounter (Signed)
Please calculate on wheel and see when he is due for his next Lupron.

## 2018-04-03 ENCOUNTER — Telehealth: Payer: Self-pay | Admitting: *Deleted

## 2018-04-03 DIAGNOSIS — E785 Hyperlipidemia, unspecified: Secondary | ICD-10-CM

## 2018-04-03 NOTE — Telephone Encounter (Signed)
[facility] started a prior authorization for [place]. The patient has not had recent fasting labs. He has been called and asked to come in for a fasting lipid and liver. He stated that he will come in next Tuesday when he gets back into town. Orders have been placed.

## 2018-04-08 ENCOUNTER — Other Ambulatory Visit
Admission: RE | Admit: 2018-04-08 | Discharge: 2018-04-08 | Disposition: A | Discharge status: Home / Self Care | Payer: Medicare Other | Source: Ambulatory Visit | Attending: Cardiovascular Disease | Admitting: Cardiovascular Disease

## 2018-04-08 DIAGNOSIS — E785 Hyperlipidemia, unspecified: Secondary | ICD-10-CM | POA: Diagnosis not present

## 2018-04-08 LAB — LIPID PANEL
Cholesterol: 89 mg/dL (ref 0–200)
HDL: 35 mg/dL — ABNORMAL LOW (ref >40)
LDL Cholesterol: 17 mg/dL (ref 0–99)
Total CHOL/HDL Ratio: 2.5 RATIO
Triglycerides: 185 mg/dL — ABNORMAL HIGH (ref <150)
VLDL: 37 mg/dL (ref 0–40)

## 2018-04-08 LAB — HEPATIC FUNCTION PANEL
ALT: 18 U/L (ref 0–44)
AST: 15 U/L (ref 15–41)
Albumin: 4.1 g/dL (ref 3.5–5.0)
Alkaline Phosphatase: 62 U/L (ref 38–126)
Bilirubin, Direct: 0.2 mg/dL (ref 0.0–0.2)
Indirect Bilirubin: 0.7 mg/dL (ref 0.3–0.9)
Total Bilirubin: 0.9 mg/dL (ref 0.3–1.2)
Total Protein: 6.7 g/dL (ref 6.5–8.1)

## 2018-04-10 NOTE — Telephone Encounter (Signed)
Attempted to finish the prior authorization for the patient. Key Code [id number]. Unable to finish due to message saying authorization was already completed and on file.

## 2018-04-15 ENCOUNTER — Encounter: Payer: Self-pay | Admitting: Internal Medicine

## 2018-04-15 ENCOUNTER — Ambulatory Visit (INDEPENDENT_AMBULATORY_CARE_PROVIDER_SITE_OTHER): Payer: Medicare Other | Admitting: Internal Medicine

## 2018-04-15 VITALS — BP 118/80 | HR 58 | Ht 69.0 in | Wt 236.0 lb

## 2018-04-15 DIAGNOSIS — E1159 Type 2 diabetes mellitus with other circulatory complications: Secondary | ICD-10-CM | POA: Diagnosis not present

## 2018-04-15 DIAGNOSIS — E785 Hyperlipidemia, unspecified: Secondary | ICD-10-CM | POA: Diagnosis not present

## 2018-04-15 DIAGNOSIS — Z23 Encounter for immunization: Secondary | ICD-10-CM | POA: Diagnosis not present

## 2018-04-15 DIAGNOSIS — I2510 Atherosclerotic heart disease of native coronary artery without angina pectoris: Secondary | ICD-10-CM

## 2018-04-15 DIAGNOSIS — Z6839 Body mass index (BMI) 39.0-39.9, adult: Secondary | ICD-10-CM

## 2018-04-15 DIAGNOSIS — E1165 Type 2 diabetes mellitus with hyperglycemia: Secondary | ICD-10-CM | POA: Diagnosis not present

## 2018-04-15 LAB — POCT GLYCOSYLATED HEMOGLOBIN (HGB A1C): Hemoglobin A1C: 5.6 % (ref 4.0–5.6)

## 2018-04-15 NOTE — Patient Instructions (Addendum)
Please continue: - Invokana 300 mg before b'fast  Please return in 3-4 months with your sugar log.

## 2018-04-15 NOTE — Progress Notes (Signed)
Patient ID: Phillip Clements, male   DOB: June 17, 1951, 67 y.o.   MRN: [id number]  HPI: Phillip Clements is a 67 y.o.-year-old male, returning for f/u for DM2, dx in 1996, insulin-dependent since 2008, uncontrolled, with complications (CAD - s/p stents, CABG, cerebro-vascular ds - s/p CVA, PVD). Last visit 4 months ago. He changed to [facility] in 10/2016.  He is now on a keto diet >> lost 35 lbs. Sugars greatly improved >> stopped insulin and Trulicity.  Last hemoglobin A1c was: Lab Results  Component Value Date   HGBA1C 7.8 12/09/2017   HGBA1C 7.9 09/10/2017   HGBA1C 7.0 06/07/2017   Pt was on a regimen of: - Invokana 300 mg before b'fast - Trulicity 1.5 mg weekly - Novolog 40 units 3x a day - 15 min before meals - Toujeo 120  units (60 x2) after dinner He tried Victoza and Januvia. Had GI intolerance (nausea, diarrhea) to regular Metformin and also with metformin ER >>stopped.  At last visit, we changed to: - Invokana 300 mg before b'fast    Pt checks his sugars 1-3 times a day: - am: 139-198 >> 115-256 >> 132-239, 286 >> 120s, 178 - 2h after b'fast: n/c - not eating b'fast - before lunch 98, 123-197 >> 150-212, 231 >> 100-120 - 2h after lunch: n/c - before dinner: 136 >> n/c >> 89, 163-288 >> 100-120 - 2h after dinner: n/c - bedtime: 160 >> n/c >> 201, 214 >> 128-150 - nighttime: n/c Lowest sugar was 98 >> 89 >> 56 (on insulin); he has hypoglycemia awareness in the 70s. Highest sugar was 256 >> 445 >> 178.  Glucometer: OneTouch Ultra mini  Pt's meals are mostly plant-based: - Breakfast:  skips - Lunch: sandwich, chips, cottage cheese, pineapple - Dinner: 2 veggies, salads; Sat night: meat - Snacks: 2/day: celery + PB; low salt triscuit + laughing cow cheese  He continues to walk on the treadmill 5 out of 7 days for 30 minutes and 2 out of 7 days he lifts weights.  -+ Mild CKD, last BUN/creatinine:  Lab Results  Component Value Date   BUN 23 04/04/2017   CREATININE  0.99 04/04/2017  On losartan. - + HL; last set of lipids: Lab Results  Component Value Date   CHOL 89 04/08/2018   HDL 35 (L) 04/08/2018   LDLCALC 17 04/08/2018   LDLDIRECT 157.1 09/07/2013   TRIG 185 (H) 04/08/2018   CHOLHDL 2.5 04/08/2018  He could not tolerate statins in the past due to joint pain.  He is on Zetia and Repatha. - last eye exam was in 06/2016: No DR + h/o cataract Sx -No numbness and tingling in his feet.  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  He is on Lupron.   ROS: Constitutional; + weight loss, no fatigue, no subjective hyperthermia, no subjective hypothermia Eyes: no blurry vision, no xerophthalmia ENT: no sore throat, no nodules palpated in throat, no dysphagia, no odynophagia, no hoarseness Cardiovascular: no CP/no SOB/no palpitations/no leg swelling Respiratory: no cough/no SOB/no wheezing Gastrointestinal: no N/no V/no D/no C/no acid reflux Musculoskeletal: no muscle aches/no joint aches Skin: no rashes, no hair loss Neurological: no tremors/no numbness/no tingling/no dizziness  I reviewed pt's medications, allergies, PMH, social hx, family hx, and changes were documented in the history of present illness. Otherwise, unchanged from my initial visit note.  Past Medical History:  Diagnosis Date  . Cataract    bilateral eye in 2010  . Coronary artery disease  a. CABG 04/2014: (LIMA--> LAD, SVG --> DIAG, SVG--> OM1, SVG--> PDA)  b. early graft failure. s/p successful rotational atherectomy and stenting of the left main and proximal LAD with DES. DES of mid LAD.(07/22/14)  . CVA (cerebral infarction)   . Hx of adenomatous colonic polyps 06/17/2017  . Hyperlipidemia   . Hypertension 2000  . OSA on CPAP   . Peripheral vascular disease ([place])    left SFA angioplasty in December 2016  . Prostate cancer ([place])   . Sleep apnea    wears CPAP nightly  . Stroke ([place])   . Type II diabetes mellitus ([place])    Past Surgical History:  Procedure  Laterality Date  . ANTERIOR CERVICAL DECOMP/DISCECTOMY FUSION  08/25/2012   Procedure: ANTERIOR CERVICAL DECOMPRESSION/DISCECTOMY FUSION 2 LEVELS;  Surgeon: Hosie Spangle, MD;  Location: [place] [facility];  Service: Neurosurgery;  Laterality: N/A;  Cervical five-six Cervical six-seven anterior cervical decompression with fusion and plating and bonegraft  . BACK SURGERY    . CARDIAC CATHETERIZATION  04/2014; 07/19/2014  . CORONARY ARTERY BYPASS GRAFT N/A 04/26/2014   Procedure: CORONARY ARTERY BYPASS GRAFTING (CABG), on pump, times four, using left internal mammary artery, right greater saphenous vein harvested endoscopically.;  Surgeon: Ivin Poot, MD;  Location: [place];  Service: Open Heart Surgery;  Laterality: N/A;  LIMA to LAD, SVG to DIAGONAL, SVG to OM1, SVG to PDA) with EVH of the RIGHT THIGH and LOWER EXTREMITY SAPHENOUS VEIN  . Cytoscopy prostatic stone o/w nml  06/21/08   Dr. Jacqlyn Larsen  . ETT myoview  09/13/09   Low risk EF 49%  . High intense focused ultrasound  07/20/06   By Dr. Jacqlyn Larsen  . INTRAOPERATIVE TRANSESOPHAGEAL ECHOCARDIOGRAM N/A 04/26/2014   Procedure: INTRAOPERATIVE TRANSESOPHAGEAL ECHOCARDIOGRAM;  Surgeon: Ivin Poot, MD;  Location: [place];  Service: Open Heart Surgery;  Laterality: N/A;  . PERCUTANEOUS CORONARY ROTOBLATOR INTERVENTION (PCI-R) N/A 07/22/2014   Procedure: PERCUTANEOUS CORONARY ROTOBLATOR INTERVENTION (PCI-R);  Surgeon: Peter M Martinique, MD;  Location: [facility] C[facility];  Service: Cardiovascular;  Laterality: N/A;  . PERIPHERAL VASCULAR CATHETERIZATION Left 07/26/2015   Procedure: Lower Extremity Angiography;  Surgeon: Katha Cabal, MD;  Location: [place] CV LAB;  Service: Cardiovascular;  Laterality: Left;  . PERIPHERAL VASCULAR CATHETERIZATION  07/26/2015   Procedure: Lower Extremity Intervention;  Surgeon: Katha Cabal, MD;  Location: [place] [facility];  Service: Cardiovascular;;  . PROSTATE BIOPSY  04/03/06 & 02/25/07   Social History   Socioeconomic  History  . Marital status: Married    Spouse name: Not on file  . Number of children: 3  . Years of education: Not on file  . Highest education level: Not on file  Occupational History  . Occupation: Insurance account manager  Social Needs  . Financial resource strain: Not on file  . Food insecurity:    Worry: Not on file    Inability: Not on file  . Transportation needs:    Medical: Not on file    Non-medical: Not on file  Tobacco Use  . Smoking status: Former Smoker    Packs/day: 1.00    Years: 36.00    Pack years: 36.00    Types: Cigarettes    Last attempt to quit: 03/01/2014    Years since quitting: 4.1  . Smokeless tobacco: Never Used  Substance and Sexual Activity  . Alcohol use: Yes    Comment: 07/19/2014 "might have a drink a couple times/yr"  .  Drug use: No  . Sexual activity: Not Currently  Lifestyle  . Physical activity:    Days per week: Not on file    Minutes per session: Not on file  . Stress: Not on file  Relationships  . Social connections:    Talks on phone: Not on file    Gets together: Not on file    Attends religious service: Not on file    Active member of club or organization: Not on file    Attends meetings of clubs or organizations: Not on file    Relationship status: Not on file  . Intimate partner violence:    Fear of current or ex partner: Not on file    Emotionally abused: Not on file    Physically abused: Not on file    Forced sexual activity: Not on file  Other Topics Concern  . Not on file  Social History Narrative   Lives with wife.   2 daughters and 1 son.   [employer]   Former offensive lineman.    Current Outpatient Medications on File Prior to Visit  Medication Sig Dispense Refill  . aspirin EC 81 MG tablet Take 1 tablet (81 mg total) by mouth daily. 90 tablet 3  . B Complex Vitamins (VITAMIN B COMPLEX PO) Take by mouth.    . B-D ULTRAFINE III SHORT PEN 31G X 8 MM MISC USE AS  DIRECTED TWICE DAILY 100 each 11  . bicalutamide (CASODEX) 50 MG tablet Take 50 mg by mouth daily.    . carvedilol (COREG) 3.125 MG tablet TAKE 1 TABLET BY MOUTH TWICE A DAY WITH MEALS 60 tablet 3  . clopidogrel (PLAVIX) 75 MG tablet TAKE 1 TABLET BY MOUTH DAILY 30 tablet 6  . Dulaglutide (TRULICITY) 1.5 ER/1.5QM SOPN Inject 1.5 mg under skin weekly 4 pen 11  . Evolocumab (REPATHA) 140 MG/ML SOSY Inject 1 pen into the skin every 14 (fourteen) days. 2 Syringe 11  . ezetimibe (ZETIA) 10 MG tablet TAKE 1 TABLET BY MOUTH ONCE A DAY 30 tablet 5  . glucose blood (ONE TOUCH ULTRA TEST) test strip Check blood sugar twice a day and as directed Dx E11.59 200 each 3  . Injection Device (CORNWALL METAL PIPETTING) MISC Frequency:PHARMDIR   Dosage:0.0     Instructions:  Note:diagnosis 250.02 Dose: NA    . insulin aspart (NOVOLOG FLEXPEN) 100 UNIT/ML FlexPen     . insulin regular human CONCENTRATED (HUMULIN R U-500 KWIKPEN) 500 UNIT/ML kwikpen Inject 50-80 Units into the skin 3 (three) times daily with meals. 6 pen 5  . INVOKANA 300 MG TABS tablet TAKE 1 TABLET BY MOUTH ONCE DAILY WITH BREAKFAST 30 tablet 11  . lansoprazole (PREVACID) 15 MG capsule Take 15 mg by mouth daily at 12 noon.    Marland Kitchen leuprolide (LUPRON) 30 MG injection Inject 45 mg into the muscle every 6 (six) months.    . liraglutide (VICTOZA) 18 MG/3ML SOPN Frequency:PHARMDIR   Dosage:0.0     Instructions:  Note:1.8 mg sq daily Dose: 0.6MG /0.1    . losartan (COZAAR) 25 MG tablet TAKE 1/2 TABLET BY MOUTH ONCE A DAY 15 tablet 5  . NITROSTAT 0.4 MG SL tablet Place 1 tablet (0.4 mg total) under the tongue as needed. 25 tablet 3  . UNIFINE PENTIPS 29G X 12MM MISC USE AS DIRECTED TWICE DAILY 100 each 5   No current facility-administered medications on file prior to visit.    Allergies  Allergen  Reactions  . Coreg [Carvedilol] Other (See Comments)    Fatigue- unclear if this was due to coreg specifically or beta blockers in general.    . Metformin And  Related Nausea Only  . Protonix [Pantoprazole Sodium] Diarrhea  . Rosuvastatin Calcium     REACTION: JOINT ACHES  . Statins     REACTION: JOINT ACHES   Family History  Problem Relation Age of Onset  . Diabetes Mother 18       DM  . Coronary artery disease Mother   . Hypertension Mother   . Heart attack Mother        CABG with MI in 2005  . Heart disease Mother        CV  . Hypertension Father   . Heart attack Father        CABG with Mi around 1997  . Coronary artery disease Father   . Heart disease Father        CV  . Diabetes Father   . Heart attack Brother        MI/PTCA  . Heart disease Brother        CV  . Diabetes Brother   . Heart attack Maternal Grandfather        MI  . Prostate cancer Paternal Grandfather   . Diabetes Sister        DM  . Breast cancer Neg Hx        Breast/ovarian/uterine cancer  . Colon cancer Neg Hx   . Depression Neg Hx   . Alcohol abuse Neg Hx        ETOH/drug abuse  . Stroke Neg Hx    PE: BP 118/80   Pulse (!) 58   Ht 5\' 9"  (1.753 m)   Wt 236 lb (107 kg)   SpO2 97%   BMI 34.85 kg/m  Body mass index is 34.85 kg/m. Wt Readings from Last 3 Encounters:  04/15/18 236 lb (107 kg)  03/20/18 244 lb 9.6 oz (110.9 kg)  12/11/17 270 lb (122.5 kg)   Constitutional: overweight, in NAD Eyes: PERRLA, EOMI, no exophthalmos ENT: moist mucous membranes, no thyromegaly, no cervical lymphadenopathy Cardiovascular: RRR, No MRG Respiratory: CTA B Gastrointestinal: abdomen soft, NT, ND, BS+ Musculoskeletal: no deformities, strength intact in all 4 Skin: moist, warm, + stasis dermatitis bilateral legs Neurological: no tremor with outstretched hands, DTR normal in all 4  ASSESSMENT: 1. DM2, insulin-dependent, uncontrolled, with complications - CAD - s/p stents, CABG - cerebro-vascular ds - s/p CVA  - PVD - s/p L stent  2. HL  3.  Obesity class II BMI Classification:  < 18.5 underweight   18.5-24.9 normal weight   25.0-29.9  overweight   30.0-34.9 class I obesity   35.0-39.9 class II obesity   ? 40.0 class III obesity   PLAN:  1. Patient with long-standing, uncontrolled, type 2 diabetes, very insulin resistant, on U500 insulin.  At last visit, his sugars were still variable.  HbA1c was slightly better 7.8%.  He was not taking insulin in the morning since he was not eating, however, he had quite high blood sugars in a.m. so I advised him to take a low dose of U500 even if he did not eat.  We also increased his insulin with dinner and advised him to also increased insulin with lunch if he had a larger meal.  Since he was not checking his sugars at bedtime and his sugars are high in the morning, we discussed about  also checking sugars at bedtime 2-3 times a week. - at this visit, sugars are excellent after he lost weight and he even came off his insulin and Trulicity! I congratulated him for this. Will continue Invokana only.  - I suggested to: Patient Instructions  Please continue: - Invokana 300 mg before b'fast  Please return in 3-4 months with your sugar log.  - today, HbA1c is 5.6% (excellent!) - continue checking sugars at different times of the day - check 3x a day, rotating checks - advised for yearly eye exams >> he is UTD - flu shot today - Return to clinic in 3-4 mo with sugar log    2. HL - He is intolerant to statins - He is now back on Repatha, this was previously not covered - Also continues on ezetimibe without side effects - Reviewed latest lipid panel from few days ago: LDL very low, triglycerides slightly high and HDL slightly low Lab Results  Component Value Date   CHOL 89 04/08/2018   HDL 35 (L) 04/08/2018   LDLCALC 17 04/08/2018   LDLDIRECT 157.1 09/07/2013   TRIG 185 (H) 04/08/2018   CHOLHDL 2.5 04/08/2018   3. Obesity -Also contributed by his Lupron injections -at this visit, he is down 35 lbs since last visit, on keto diet >> advised to continue but introduce some more carbs  in the near future  (now 20% of diet)  Philemon Kingdom, MD PhD [facility] Endocrinology

## 2018-04-15 NOTE — Addendum Note (Signed)
Addended by: Drucilla Schmidt on: 04/15/2018 12:01 PM   Modules accepted: Orders

## 2018-04-16 NOTE — Telephone Encounter (Signed)
Prior authorization has been approved for Repatha.

## 2018-05-14 ENCOUNTER — Other Ambulatory Visit: Payer: Self-pay | Admitting: Cardiovascular Disease

## 2018-05-22 DIAGNOSIS — E119 Type 2 diabetes mellitus without complications: Secondary | ICD-10-CM | POA: Diagnosis not present

## 2018-05-22 LAB — HM DIABETES EYE EXAM

## 2018-05-23 ENCOUNTER — Encounter: Payer: Self-pay | Admitting: Family Medicine

## 2018-06-26 DIAGNOSIS — G4733 Obstructive sleep apnea (adult) (pediatric): Secondary | ICD-10-CM | POA: Diagnosis not present

## 2018-06-27 ENCOUNTER — Other Ambulatory Visit: Payer: Self-pay

## 2018-07-01 ENCOUNTER — Other Ambulatory Visit: Payer: Self-pay | Admitting: Cardiovascular Disease

## 2018-07-01 DIAGNOSIS — I10 Essential (primary) hypertension: Secondary | ICD-10-CM

## 2018-07-01 DIAGNOSIS — I2510 Atherosclerotic heart disease of native coronary artery without angina pectoris: Secondary | ICD-10-CM

## 2018-07-01 DIAGNOSIS — E785 Hyperlipidemia, unspecified: Secondary | ICD-10-CM

## 2018-07-01 DIAGNOSIS — R079 Chest pain, unspecified: Secondary | ICD-10-CM

## 2018-07-17 ENCOUNTER — Other Ambulatory Visit: Payer: Self-pay | Admitting: Cardiovascular Disease

## 2018-07-17 DIAGNOSIS — I2510 Atherosclerotic heart disease of native coronary artery without angina pectoris: Secondary | ICD-10-CM

## 2018-07-17 DIAGNOSIS — E785 Hyperlipidemia, unspecified: Secondary | ICD-10-CM

## 2018-07-17 DIAGNOSIS — I10 Essential (primary) hypertension: Secondary | ICD-10-CM

## 2018-07-17 DIAGNOSIS — R079 Chest pain, unspecified: Secondary | ICD-10-CM

## 2018-07-24 ENCOUNTER — Encounter: Payer: Self-pay | Admitting: Internal Medicine

## 2018-07-24 ENCOUNTER — Ambulatory Visit (INDEPENDENT_AMBULATORY_CARE_PROVIDER_SITE_OTHER): Payer: Medicare Other | Admitting: Internal Medicine

## 2018-07-24 VITALS — BP 130/80 | HR 59 | Ht 69.0 in | Wt 231.0 lb

## 2018-07-24 DIAGNOSIS — E1159 Type 2 diabetes mellitus with other circulatory complications: Secondary | ICD-10-CM

## 2018-07-24 DIAGNOSIS — I2510 Atherosclerotic heart disease of native coronary artery without angina pectoris: Secondary | ICD-10-CM | POA: Diagnosis not present

## 2018-07-24 DIAGNOSIS — Z6839 Body mass index (BMI) 39.0-39.9, adult: Secondary | ICD-10-CM

## 2018-07-24 DIAGNOSIS — E785 Hyperlipidemia, unspecified: Secondary | ICD-10-CM | POA: Diagnosis not present

## 2018-07-24 DIAGNOSIS — E1165 Type 2 diabetes mellitus with hyperglycemia: Secondary | ICD-10-CM

## 2018-07-24 LAB — POCT GLYCOSYLATED HEMOGLOBIN (HGB A1C): Hemoglobin A1C: 7.9 % — AB (ref 4.0–5.6)

## 2018-07-24 MED ORDER — DULAGLUTIDE 1.5 MG/0.5ML ~~LOC~~ SOAJ: 1.5000 mg | SUBCUTANEOUS | 3 refills | Status: DC

## 2018-07-24 NOTE — Progress Notes (Signed)
Patient ID: Phillip Clements, male   DOB: 01-20-1951, 67 y.o.   MRN: [id number]  HPI: [name] is a 67 y.o.-year-old male, returning for f/u for DM2, dx in 1996, insulin-dependent since 2008, previously uncontrolled, with complications (CAD - s/p stents, CABG, cerebro-vascular ds - s/p CVA, PVD). Last visit 3 months ago. He changed to [facility] in 10/2016.  He relaxed his diet since last OV (Halloween, his b'day, Txgiving) >> sugars are higher .  Last hemoglobin A1c was: Lab Results  Component Value Date   HGBA1C 5.6 04/15/2018   HGBA1C 7.8 12/09/2017   HGBA1C 7.9 09/10/2017   Pt was on a regimen of: - Invokana 300 mg before b'fast - Trulicity 1.5 mg weekly - Novolog 40 units 3x a day - 15 min before meals - Toujeo 120  units (60 x2) after dinner He tried Victoza and Januvia. Had GI intolerance (nausea, diarrhea) to regular Metformin and also with metformin ER >>stopped.  He was then on: - Invokana 300 mg before breakfast - Trulicity 1.5 mg weekly - U500 insulin 55-70 units 3 times a day before meals Also, if sugars in the morning are: - 160-200, take 10 units - 201-240, take 15 units - >240, take 20 units  Before last visit, he discontinued W098 and Trulicity-only taking: - Invokana 300 mg before breakfast  Pt checks his sugars seldom (!). Usually in the 200s.  From last OV: - am: 132-239, 286 >> 120s, 178 - 2h after b'fast: n/c - not eating b'fast - before lunch 150-212, 231 >> 100-120 - 2h after lunch: n/c - before dinner: 89, 163-288 >> 100-120 - 2h after dinner: n/c - bedtime: 201, 214 >> 128-150 - nighttime: n/c Lowest sugar was 56 (on insulin) >> 61; he has hypoglycemia awareness in the 70s. Highest sugar was 256 >> 445 >> 178 >> 223.  Glucometer: OneTouch Ultra mini  Pt's meals are mostly plant-based: - Breakfast:  skips - Lunch: sandwich, chips, cottage cheese, pineapple - Dinner: 2 veggies, salads; Sat night: meat - Snacks: 2/day: celery + PB; low  salt triscuit + laughing cow cheese  He continues to walk on the treadmill 5/7 days for 30 minutes and he lifts weights for 2/7 days.  -+ Mild CKD, last BUN/creatinine:  Lab Results  Component Value Date   BUN 23 04/04/2017   CREATININE 0.99 04/04/2017  On losartan. -+ HL; last set of lipids: Lab Results  Component Value Date   CHOL 89 04/08/2018   HDL 35 (L) 04/08/2018   LDLCALC 17 04/08/2018   LDLDIRECT 157.1 09/07/2013   TRIG 185 (H) 04/08/2018   CHOLHDL 2.5 04/08/2018  He could not tolerate statins in the past due to joint pain.  He is on Zetia and Repatha. - last eye exam was in 05/2018: No DR + h/o cataract Sx -No numbness and tingling in his feet.  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  He is on Lupron.  ROS: Constitutional: no weight gain/+ weight loss, no fatigue, + subjective hyperthermia, no subjective hypothermia, + nocturia Eyes: no blurry vision, no xerophthalmia ENT: no sore throat, no nodules palpated in neck, no dysphagia, no odynophagia, no hoarseness Cardiovascular: no CP/no SOB/no palpitations/no leg swelling Respiratory: no cough/no SOB/no wheezing Gastrointestinal: no N/no V/no D/no C/no acid reflux Musculoskeletal: no muscle aches/no joint aches Skin: no rashes, no hair loss Neurological: no tremors/no numbness/no tingling/no dizziness  I reviewed pt's medications, allergies, PMH, social hx, family hx, and changes were documented in  the history of present illness. Otherwise, unchanged from my initial visit note.  Past Medical History:  Diagnosis Date  . Cataract    bilateral eye in 2010  . Coronary artery disease    a. CABG 04/2014: (LIMA--> LAD, SVG --> DIAG, SVG--> OM1, SVG--> PDA)  b. early graft failure. s/p successful rotational atherectomy and stenting of the left main and proximal LAD with DES. DES of mid LAD.(07/22/14)  . CVA (cerebral infarction)   . Hx of adenomatous colonic polyps 06/17/2017  . Hyperlipidemia   . Hypertension  2000  . OSA on CPAP   . Peripheral vascular disease ([place])    left SFA angioplasty in December 2016  . Prostate cancer ([place])   . Sleep apnea    wears CPAP nightly  . Stroke ([place])   . Type II diabetes mellitus ([place])    Past Surgical History:  Procedure Laterality Date  . ANTERIOR CERVICAL DECOMP/DISCECTOMY FUSION  08/25/2012   Procedure: ANTERIOR CERVICAL DECOMPRESSION/DISCECTOMY FUSION 2 LEVELS;  Surgeon: Hosie Spangle, MD;  Location: [place] [facility];  Service: Neurosurgery;  Laterality: N/A;  Cervical five-six Cervical six-seven anterior cervical decompression with fusion and plating and bonegraft  . BACK SURGERY    . CARDIAC CATHETERIZATION  04/2014; 07/19/2014  . CORONARY ARTERY BYPASS GRAFT N/A 04/26/2014   Procedure: CORONARY ARTERY BYPASS GRAFTING (CABG), on pump, times four, using left internal mammary artery, right greater saphenous vein harvested endoscopically.;  Surgeon: Ivin Poot, MD;  Location: [place];  Service: Open Heart Surgery;  Laterality: N/A;  LIMA to LAD, SVG to DIAGONAL, SVG to OM1, SVG to PDA) with EVH of the RIGHT THIGH and LOWER EXTREMITY SAPHENOUS VEIN  . Cytoscopy prostatic stone o/w nml  06/21/08   Dr. Jacqlyn Larsen  . ETT myoview  09/13/09   Low risk EF 49%  . High intense focused ultrasound  07/20/06   By Dr. Jacqlyn Larsen  . INTRAOPERATIVE TRANSESOPHAGEAL ECHOCARDIOGRAM N/A 04/26/2014   Procedure: INTRAOPERATIVE TRANSESOPHAGEAL ECHOCARDIOGRAM;  Surgeon: Ivin Poot, MD;  Location: [place];  Service: Open Heart Surgery;  Laterality: N/A;  . PERCUTANEOUS CORONARY ROTOBLATOR INTERVENTION (PCI-R) N/A 07/22/2014   Procedure: PERCUTANEOUS CORONARY ROTOBLATOR INTERVENTION (PCI-R);  Surgeon: Peter M Martinique, MD;  Location: [facility] C[facility] LAB;  Service: Cardiovascular;  Laterality: N/A;  . PERIPHERAL VASCULAR CATHETERIZATION Left 07/26/2015   Procedure: Lower Extremity Angiography;  Surgeon: Katha Cabal, MD;  Location: [place] CV LAB;  Service: Cardiovascular;  Laterality: Left;   . PERIPHERAL VASCULAR CATHETERIZATION  07/26/2015   Procedure: Lower Extremity Intervention;  Surgeon: Katha Cabal, MD;  Location: [place] CV LAB;  Service: Cardiovascular;;  . PROSTATE BIOPSY  04/03/06 & 02/25/07   Social History   Socioeconomic History  . Marital status: Married    Spouse name: Not on file  . Number of children: 3  . Years of education: Not on file  . Highest education level: Not on file  Occupational History  . Occupation: Insurance account manager  Social Needs  . Financial resource strain: Not on file  . Food insecurity:    Worry: Not on file    Inability: Not on file  . Transportation needs:    Medical: Not on file    Non-medical: Not on file  Tobacco Use  . Smoking status: Former Smoker    Packs/day: 1.00    Years: 36.00    Pack years: 36.00    Types: Cigarettes    Last attempt to quit: 03/01/2014  Years since quitting: 4.4  . Smokeless tobacco: Never Used  Substance and Sexual Activity  . Alcohol use: Yes    Comment: 07/19/2014 "might have a drink a couple times/yr"  . Drug use: No  . Sexual activity: Not Currently  Lifestyle  . Physical activity:    Days per week: Not on file    Minutes per session: Not on file  . Stress: Not on file  Relationships  . Social connections:    Talks on phone: Not on file    Gets together: Not on file    Attends religious service: Not on file    Active member of club or organization: Not on file    Attends meetings of clubs or organizations: Not on file    Relationship status: Not on file  . Intimate partner violence:    Fear of current or ex partner: Not on file    Emotionally abused: Not on file    Physically abused: Not on file    Forced sexual activity: Not on file  Other Topics Concern  . Not on file  Social History Narrative   Lives with wife.   2 daughters and 1 son.   [employer]   Former offensive lineman.    Current Outpatient  Medications on File Prior to Visit  Medication Sig Dispense Refill  . aspirin EC 81 MG tablet Take 1 tablet (81 mg total) by mouth daily. 90 tablet 3  . B Complex Vitamins (VITAMIN B COMPLEX PO) Take by mouth.    . B-D ULTRAFINE III SHORT PEN 31G X 8 MM MISC USE AS DIRECTED TWICE DAILY 100 each 11  . bicalutamide (CASODEX) 50 MG tablet Take 50 mg by mouth daily.    . carvedilol (COREG) 3.125 MG tablet TAKE 1 TABLET BY MOUTH TWICE A DAY WITH MEALS 60 tablet 3  . clopidogrel (PLAVIX) 75 MG tablet TAKE 1 TABLET BY MOUTH ONCE DAILY 30 tablet 1  . Evolocumab (REPATHA) 140 MG/ML SOSY Inject 1 pen into the skin every 14 (fourteen) days. 2 Syringe 11  . ezetimibe (ZETIA) 10 MG tablet TAKE 1 TABLET BY MOUTH ONCE DAILY 30 tablet 5  . glucose blood (ONE TOUCH ULTRA TEST) test strip Check blood sugar twice a day and as directed Dx E11.59 200 each 3  . Injection Device (CORNWALL METAL PIPETTING) MISC Frequency:PHARMDIR   Dosage:0.0     Instructions:  Note:diagnosis 250.02 Dose: NA    . INVOKANA 300 MG TABS tablet TAKE 1 TABLET BY MOUTH ONCE DAILY WITH BREAKFAST 30 tablet 11  . lansoprazole (PREVACID) 15 MG capsule Take 15 mg by mouth daily at 12 noon.    Marland Kitchen leuprolide (LUPRON) 30 MG injection Inject 45 mg into the muscle every 6 (six) months.    Marland Kitchen losartan (COZAAR) 25 MG tablet TAKE 1/2 TABLET BY MOUTH ONCE A DAY 15 tablet 5  . NITROSTAT 0.4 MG SL tablet Place 1 tablet (0.4 mg total) under the tongue as needed. 25 tablet 3  . UNIFINE PENTIPS 29G X 12MM MISC USE AS DIRECTED TWICE DAILY 100 each 5   No current facility-administered medications on file prior to visit.    Allergies  Allergen Reactions  . Coreg [Carvedilol] Other (See Comments)    Fatigue- unclear if this was due to coreg specifically or beta blockers in general.    . Metformin And Related Nausea Only  . Protonix [Pantoprazole Sodium] Diarrhea  . Rosuvastatin Calcium  REACTION: JOINT ACHES  . Statins     REACTION: JOINT ACHES    Family History  Problem Relation Age of Onset  . Diabetes Mother 45       DM  . Coronary artery disease Mother   . Hypertension Mother   . Heart attack Mother        CABG with MI in 2005  . Heart disease Mother        CV  . Hypertension Father   . Heart attack Father        CABG with Mi around 1997  . Coronary artery disease Father   . Heart disease Father        CV  . Diabetes Father   . Heart attack Brother        MI/PTCA  . Heart disease Brother        CV  . Diabetes Brother   . Heart attack Maternal Grandfather        MI  . Prostate cancer Paternal Grandfather   . Diabetes Sister        DM  . Breast cancer Neg Hx        Breast/ovarian/uterine cancer  . Colon cancer Neg Hx   . Depression Neg Hx   . Alcohol abuse Neg Hx        ETOH/drug abuse  . Stroke Neg Hx    PE: BP 130/80   Pulse (!) 59   Ht 5\' 9"  (1.753 m) Comment: measured  Wt 231 lb (104.8 kg)   SpO2 97%   BMI 34.11 kg/m  Body mass index is 34.11 kg/m. Wt Readings from Last 3 Encounters:  07/24/18 231 lb (104.8 kg)  04/15/18 236 lb (107 kg)  03/20/18 244 lb 9.6 oz (110.9 kg)   Constitutional: overweight, in NAD Eyes: PERRLA, EOMI, no exophthalmos ENT: moist mucous membranes, no thyromegaly, no cervical lymphadenopathy Cardiovascular: RRR, No MRG Respiratory: CTA B Gastrointestinal: abdomen soft, NT, ND, BS+ Musculoskeletal: no deformities, strength intact in all 4 Skin: moist, warm, no rashes except stasis dermatitis in bilateral legs Neurological: no tremor with outstretched hands, DTR normal in all 4  ASSESSMENT: 1. DM2, insulin-dependent, previously uncontrolled, with complications - CAD - s/p stents, CABG - cerebro-vascular ds - s/p CVA  - PVD - s/p L stent  2. HL  3.  Obesity class II BMI Classification:  < 18.5 underweight   18.5-24.9 normal weight   25.0-29.9 overweight   30.0-34.9 class I obesity   35.0-39.9 class II obesity   ? 40.0 class III obesity   PLAN:  1.  Patient with longstanding, previously uncontrolled, type 2 diabetes.  His diabetes was very insulin resistant and we were using U500 insulin and also Trulicity.  However, before last visit, he started on a keto diet and he lost a significant amount of weight (35 pounds).  His sugars improved significantly and he was able to come off all of his insulin and GLP-1 receptor agonist.  He was only on Invokana and his sugars were at goal.  HbA1c returned excellent, at 5.6%.  We did not change his regimen but we did discuss about slightly increasing the amount of carbohydrates in his diet. -Since last visit, however, he relaxes diet due to several holidays and his birthday.  Moreover, he stopped checking sugars, with few exceptions.  Whenever he checked, his sugars were in the 200s. Today, HbA1c is 7.9% (higher)  -He is determined to restart checking his blood sugars and get back  on his diet after the first of the year.  However, for now, since his sugars are high, I suggested to add back Trulicity, which we can discontinue after the holidays, if sugars improved.  He agrees with this.  We discussed that this is not lowering his sugars to the point of hypoglycemia and also does not cause weight gain. - I suggested to: Patient Instructions  Please continue: - Invokana 300 mg bfore b'fast  Restart: - Trulicity 1.5 mg weekly.  Please let me know if the sugars are consistently <80 or >200.  Please return in 3 months with your sugar log.   - continue checking sugars at different times of the day - check 1-2x a day, rotating checks - advised for yearly eye exams >> he is UTD - Return to clinic in 4 mo with sugar log     2. HL -He is intolerant to statins, however, before last visit he restarted on Repatha, which is now covered.  He also continues ezetimibe.  He has no side effects from these medications. -Latest lipid panel was reviewed from 03/2018: LDL was very low, triglycerides high and HDL low: Lab  Results  Component Value Date   CHOL 89 04/08/2018   HDL 35 (L) 04/08/2018   LDLCALC 17 04/08/2018   LDLDIRECT 157.1 09/07/2013   TRIG 185 (H) 04/08/2018   CHOLHDL 2.5 04/08/2018   3. Obesity -Also contributed to by his Lupron injections -Before last visit, he did wonderful on the keto diet, losing 35 pounds.  At that time, we discussed about continuing the diet but introducing more carbs (at that time he was on 20% carbs per day) - lost 5 more lbs since last OV -We will continue Invokana and restart Trulicity, which should also help  Philemon Kingdom, MD PhD [facility]

## 2018-07-24 NOTE — Patient Instructions (Signed)
Please continue: - Invokana 300 mg bfore b'fast  Restart: - Trulicity 1.5 mg weekly.  Please let me know if the sugars are consistently <80 or >200.  Please return in 3 months with your sugar log.

## 2018-07-24 NOTE — Addendum Note (Signed)
Addended by: Cardell Peach I on: 07/24/2018 10:21 AM   Modules accepted: Orders

## 2018-08-12 ENCOUNTER — Telehealth: Payer: Self-pay | Admitting: Urology

## 2018-08-12 NOTE — Telephone Encounter (Signed)
Lovena Le from [facility] [phone]) called the office to request a prescription from Dr. Bernardo Heater for patient for the generic for Casodex (Bicalutamide 50mg ).  It was previously prescribed by Dr. Jacqlyn Larsen and patient is in need of a new prescription.

## 2018-08-15 MED ORDER — BICALUTAMIDE 50 MG PO TABS: 50.0000 mg | ORAL_TABLET | Freq: Every day | ORAL | 2 refills | Status: DC

## 2018-08-15 NOTE — Telephone Encounter (Signed)
rx sent

## 2018-08-18 ENCOUNTER — Other Ambulatory Visit: Payer: Self-pay | Admitting: Cardiovascular Disease

## 2018-08-26 DIAGNOSIS — G4733 Obstructive sleep apnea (adult) (pediatric): Secondary | ICD-10-CM | POA: Diagnosis not present

## 2018-08-28 ENCOUNTER — Ambulatory Visit (INDEPENDENT_AMBULATORY_CARE_PROVIDER_SITE_OTHER): Payer: Medicare Other

## 2018-08-28 VITALS — BP 133/67 | HR 60 | Wt 230.0 lb

## 2018-08-28 DIAGNOSIS — C61 Malignant neoplasm of prostate: Secondary | ICD-10-CM | POA: Diagnosis not present

## 2018-08-28 MED ORDER — LEUPROLIDE ACETATE (6 MONTH) 45 MG IM KIT
45.0000 mg | PACK | Freq: Once | INTRAMUSCULAR | Status: AC
  Administered 2018-08-28: 45 mg via INTRAMUSCULAR

## 2018-08-28 NOTE — Progress Notes (Signed)
Lupron IM Injection   Due to Prostate Cancer patient is present today for a Lupron Injection.  Medication: Lupron 6 month Dose: 45 mg  Location: right upper outer buttocks Lot: [id number] Exp: 07/05/2020  Patient tolerated well, no complications were noted  Performed by: Elizabeth Palau, CMA  Follow up: 6 months

## 2018-09-29 ENCOUNTER — Other Ambulatory Visit: Payer: Self-pay

## 2018-09-29 DIAGNOSIS — C61 Malignant neoplasm of prostate: Secondary | ICD-10-CM

## 2018-09-30 ENCOUNTER — Other Ambulatory Visit: Payer: Medicare Other

## 2018-09-30 DIAGNOSIS — C61 Malignant neoplasm of prostate: Secondary | ICD-10-CM | POA: Diagnosis not present

## 2018-10-01 LAB — PSA: Prostate Specific Ag, Serum: 0.2 ng/mL (ref 0.0–4.0)

## 2018-10-03 ENCOUNTER — Encounter: Payer: Self-pay | Admitting: Urology

## 2018-10-03 ENCOUNTER — Ambulatory Visit (INDEPENDENT_AMBULATORY_CARE_PROVIDER_SITE_OTHER): Payer: Medicare Other | Admitting: Urology

## 2018-10-03 VITALS — BP 124/66 | HR 74 | Ht 69.0 in | Wt 227.0 lb

## 2018-10-03 DIAGNOSIS — C61 Malignant neoplasm of prostate: Secondary | ICD-10-CM

## 2018-10-03 NOTE — Patient Instructions (Signed)
Prostate Cancer  The prostate is a walnut-sized gland that is involved in the production of semen. It is located below a man's bladder, in front of the rectum. Prostate cancer is the abnormal growth of cells in the prostate gland. What are the causes? The exact cause of this condition is not known. What increases the risk? This condition is more likely to develop in men who:  Are older than age 68.  Are African-American.  Are obese.  Have a family history of prostate cancer.  Have a family history of breast cancer. What are the signs or symptoms? Symptoms of this condition include:  A need to urinate often.  Weak or interrupted flow of urine.  Trouble starting or stopping urination.  Inability to urinate.  Pain or burning during urination.  Painful ejaculation.  Blood in urine or semen.  Persistent pain or discomfort in the lower back, lower abdomen, hips, or upper thighs.  Trouble getting an erection.  Trouble emptying the bladder all the way. How is this diagnosed? This condition can be diagnosed with:  A digital rectal exam. For this exam, a health care provider inserts a gloved finger into the rectum to feel the prostate gland.  A blood test called a prostate-specific antigen (PSA) test.  An imaging test called transrectal ultrasonography.  A procedure in which a sample of tissue is taken from the prostate and examined under a microscope (prostate biopsy). Once the condition is diagnosed, tests will be done to determine how far the cancer has spread. This is called staging the cancer. Staging may involve imaging tests, such as:  A bone scan.  A CT scan.  A PET scan.  An MRI. The stages of prostate cancer are as follows:  Stage I. At this stage, the cancer is found in the prostate only. The cancer is not visible on imaging tests and it is usually found by accident, such as during a prostate surgery.  Stage II. At this stage, the cancer is more advanced  than it is in stage I, but the cancer has not spread outside the prostate.  Stage III. At this stage, the cancer has spread beyond the outer layer of the prostate to nearby tissues. The cancer may be found in the seminal vesicles, which are near the bladder and the prostate.  Stage IV. At this stage, the cancer has spread other parts of the body, such as the lymph nodes, bones, bladder, rectum, liver, or lungs. How is this treated? Treatment for this condition depends on several factors, including the stage of the cancer, your age, personal preferences, and your overall health. Talk with your health care provider about treatment options that are recommended for you. Common treatments include:  Observation for early stage prostate cancer (active surveillance). This involves having exams, blood tests, and in some cases, more biopsies. For some men, this is the only treatment needed.  Surgery. Types of surgeries include: ? Open surgery. In this surgery, a larger incision is made to remove the prostate. ? A laparoscopic prostatectomy. This is a surgery to remove the prostate and lymph nodes through several, small incisions. It is often referred to as a minimally invasive surgery. ? A robotic prostatectomy. This is a surgery to remove the prostate and lymph nodes with the help of a robotic arm that is controlled by a computer. ? Orchiectomy. This is a surgery to remove the testicles. ? Cryosurgery. This is a surgery to freeze and destroy cancer cells.  Radiation treatment. Types   of radiation treatment include: ? External beam radiation. This type aims beams of radiation from outside the body at the prostate to destroy cancerous cells. ? Brachytherapy. This type uses radioactive needles, seeds, wires, or tubes that are implanted into the prostate gland. Like external beam radiation, brachytherapy destroys cancerous cells. An advantage is that this type of radiation limits the damage to surrounding  tissue and has fewer side effects.  High-intensity, focused ultrasonography. This treatment destroys cancer cells by delivering high-energy ultrasound waves to the cancerous cells.  Chemotherapy medicines. This treatment kills cancer cells or stops them from multiplying.  Hormone treatment. This treatment involves taking medicines that act on one of the male hormones (testosterone): ? By stopping your body from producing testosterone. ? By blocking testosterone from reaching cancer cells. Follow these instructions at home:  Take over-the-counter and prescription medicines only as told by your health care provider.  Maintain a healthy diet.  Get plenty of sleep.  Consider joining a support group for men who have prostate cancer. Meeting with a support group may help you learn to cope with the stress of having cancer.  Keep all follow-up visits as told by your health care provider. This is important.  If you have to go to the hospital, notify your cancer specialist (oncologist).  Treatment for prostate cancer may affect sexual function. Continue to have intimate moments with your partner. This may include touching, holding, hugging, and caressing. Contact a health care provider if:  You have trouble urinating.  You have blood in your urine.  You have pain in your hips, back, or chest. Get help right away if:  You have weakness or numbness in your legs.  You cannot control urination or your bowel movements (incontinence).  You have trouble breathing.  You have sudden chest pain.  You have chills or a fever. Summary  The prostate is a walnut-sized gland that is involved in the production of semen. It is located below a man's bladder, in front of the rectum. Prostate cancer is the abnormal growth of cells in the prostate gland.  Treatment for this condition depends on several factors, including the stage of the cancer, your age, personal preferences, and your overall health.  Talk with your health care provider about treatment options that are recommended for you.  Consider joining a support group for men who have prostate cancer. Meeting with a support group may help you learn to cope with the stress of having cancer. This information is not intended to replace advice given to you by your health care provider. Make sure you discuss any questions you have with your health care provider. Document Released: 07/30/2005 Document Revised: 04/03/2017 Document Reviewed: 04/09/2016 Elsevier Interactive Patient Education  2019 [employer]  

## 2018-10-03 NOTE — Progress Notes (Signed)
10/03/2018 1:47 PM   DAREN YEAGLE [birth year] [id number]  Referring provider: Tonia Ghent, MD [address] [place], [place] [address]  Chief Complaint  Patient presents with  . Prostate Cancer   Urologic history: 1. Prostate adenocarcinoma  a. 02/2006 PSA returned elevated at 17.49  b. 04/03/2006 f/u PSA 24.7 and prostate biopsy pursued; Gleason 3+3 disease with 4/4 cores positive for disease; LVI not reported  c. patient treated [place] definitive [place]U [place] late 2007  d. persistent PSA elevation over period 2007-2010; nadir 2.1 09/16/06, peak 15.0 ng/mL  e. 04/2006 bone scan and CT abd/pelvis without evidence of metastatic spread of prostate cancer  f. 03/08/2007 CT abdomen/pelvis with no definitive metastatic disease noted  g. 03/18/2007 prostascint scan with no evidence of disease but small pelvic lymph nodes noted  h. 08/15/2009 prostascint scan with no evidence of local prostate cancer or distant metastases  i. 08/22/2009 PSA 15 ng/mL; 02/06/2010 PSA 20.5 ng/mL  j. 03/08/2010 PSA 20.9 ng/mL  k. 06/02/10 initiation of degarelix  l. 05/25/10 PET CT (Fluoride) revealed heterogenous areas of FDG-avidity in ribs, femurs, humeri, areas also in the metatarsals noted which may have been arthiritic in nature  m. 07/03/10 PSA 9.6 ng/mL   HPI: 68 year old male presents for six-month follow-up of metastatic castrate sensitive prostate cancer.  His last Lupron injection was 08/28/2018.  He has no bothersome lower urinary tract symptoms.  Denies weight loss, anorexia or bone pain.  He remains on bicalutamide.  A PSA on 09/30/2018 was 0.2 which is above baseline as his most recent PSAs have been undetectable at <0.1.   PMH: Past Medical History:  Diagnosis Date  . Cataract    bilateral eye in 2010  . Coronary artery disease    a. CABG 04/2014: (LIMA--> LAD, SVG --> DIAG, SVG--> OM1, SVG--> PDA)  b. early graft failure. s/p successful rotational atherectomy and stenting of  the left main and proximal LAD with DES. DES of mid LAD.(07/22/14)  . CVA (cerebral infarction)   . Hx of adenomatous colonic polyps 06/17/2017  . Hyperlipidemia   . Hypertension 2000  . OSA on CPAP   . Peripheral vascular disease ([place])    left SFA angioplasty in December 2016  . Prostate cancer ([place])   . Sleep apnea    wears CPAP nightly  . Stroke ([place])   . Type II diabetes mellitus ([place])     Surgical History: Past Surgical History:  Procedure Laterality Date  . ANTERIOR CERVICAL DECOMP/DISCECTOMY FUSION  08/25/2012   Procedure: ANTERIOR CERVICAL DECOMPRESSION/DISCECTOMY FUSION 2 LEVELS;  Surgeon: Hosie Spangle, MD;  Location: [facility];  Service: Neurosurgery;  Laterality: N/A;  Cervical five-six Cervical six-seven anterior cervical decompression with fusion and plating and bonegraft  . BACK SURGERY    . CARDIAC CATHETERIZATION  04/2014; 07/19/2014  . CORONARY ARTERY BYPASS GRAFT N/A 04/26/2014   Procedure: CORONARY ARTERY BYPASS GRAFTING (CABG), on pump, times four, using left internal mammary artery, right greater saphenous vein harvested endoscopically.;  Surgeon: Ivin Poot, MD;  Location: [facility];  Service: Open Heart Surgery;  Laterality: N/A;  LIMA to LAD, SVG to DIAGONAL, SVG to OM1, SVG to PDA) with EVH of the RIGHT THIGH and LOWER EXTREMITY SAPHENOUS VEIN  . Cytoscopy prostatic stone o/w nml  06/21/08   Dr. Jacqlyn Larsen  . ETT myoview  09/13/09   Low risk EF 49%  . High intense focused ultrasound  07/20/06   By Dr. Jacqlyn Larsen  .  INTRAOPERATIVE TRANSESOPHAGEAL ECHOCARDIOGRAM N/A 04/26/2014   Procedure: INTRAOPERATIVE TRANSESOPHAGEAL ECHOCARDIOGRAM;  Surgeon: Ivin Poot, MD;  Location: [facility];  Service: Open Heart Surgery;  Laterality: N/A;  . PERCUTANEOUS CORONARY ROTOBLATOR INTERVENTION (PCI-R) N/A 07/22/2014   Procedure: PERCUTANEOUS CORONARY ROTOBLATOR INTERVENTION (PCI-R);  Surgeon: Peter M Martinique, MD;  Location: [facility] CATH LAB;  Service: Cardiovascular;  Laterality: N/A;  .  PERIPHERAL VASCULAR CATHETERIZATION Left 07/26/2015   Procedure: Lower Extremity Angiography;  Surgeon: Katha Cabal, MD;  Location: [facility] CV LAB;  Service: Cardiovascular;  Laterality: Left;  . PERIPHERAL VASCULAR CATHETERIZATION  07/26/2015   Procedure: Lower Extremity Intervention;  Surgeon: Katha Cabal, MD;  Location: [facility] CV LAB;  Service: Cardiovascular;;  . PROSTATE BIOPSY  04/03/06 & 02/25/07    Home Medications:  Allergies as of 10/03/2018      Reactions   Coreg [carvedilol] Other (See Comments)   Fatigue- unclear if this was due to coreg specifically or beta blockers in general.     Metformin And Related Nausea Only   Protonix [pantoprazole Sodium] Diarrhea   Rosuvastatin Calcium    REACTION: JOINT ACHES   Statins    REACTION: JOINT ACHES      Medication List       Accurate as of October 03, 2018  1:47 PM. Always use your most recent med list.        aspirin EC 81 MG tablet Take 1 tablet (81 mg total) by mouth daily.   B-D ULTRAFINE III SHORT PEN 31G X 8 MM Misc Generic drug:  Insulin Pen Needle USE AS DIRECTED TWICE DAILY   UNIFINE PENTIPS 29G X 12MM Misc Generic drug:  Insulin Pen Needle USE AS DIRECTED TWICE DAILY   bicalutamide 50 MG tablet Commonly known as:  CASODEX Take 1 tablet (50 mg total) by mouth daily.   carvedilol 3.125 MG tablet Commonly known as:  COREG TAKE 1 TABLET BY MOUTH TWICE A DAY WITH MEALS   clopidogrel 75 MG tablet Commonly known as:  PLAVIX TAKE 1 TABLET BY MOUTH ONCE A DAY   CORNWALL METAL PIPETTING Misc Frequency:PHARMDIR   Dosage:0.0     Instructions:  Note:diagnosis 250.02 Dose: NA   Dulaglutide 1.5 MG/0.5ML Sopn Commonly known as:  TRULICITY Inject 1.5 mg into the skin once a week.   Evolocumab 140 MG/ML Sosy Commonly known as:  REPATHA Inject 1 pen into the skin every 14 (fourteen) days.   ezetimibe 10 MG tablet Commonly known as:  ZETIA TAKE 1 TABLET BY MOUTH ONCE DAILY   glucose  blood test strip Commonly known as:  ONE TOUCH ULTRA TEST Check blood sugar twice a day and as directed Dx E11.59   INVOKANA 300 MG Tabs tablet Generic drug:  canagliflozin TAKE 1 TABLET BY MOUTH ONCE DAILY WITH BREAKFAST   lansoprazole 15 MG capsule Commonly known as:  PREVACID Take 15 mg by mouth daily at 12 noon.   leuprolide 30 MG injection Commonly known as:  LUPRON Inject 45 mg into the muscle every 6 (six) months.   losartan 25 MG tablet Commonly known as:  COZAAR TAKE 1/2 TABLET BY MOUTH ONCE A DAY   NITROSTAT 0.4 MG SL tablet Generic drug:  nitroGLYCERIN Place 1 tablet (0.4 mg total) under the tongue as needed.   VITAMIN B COMPLEX PO Take by mouth.       Allergies:  Allergies  Allergen Reactions  . Coreg [Carvedilol] Other (See Comments)    Fatigue- unclear if this was due  to coreg specifically or beta blockers in general.    . Metformin And Related Nausea Only  . Protonix [Pantoprazole Sodium] Diarrhea  . Rosuvastatin Calcium     REACTION: JOINT ACHES  . Statins     REACTION: JOINT ACHES    Family History: Family History  Problem Relation Age of Onset  . Diabetes Mother 57       DM  . Coronary artery disease Mother   . Hypertension Mother   . Heart attack Mother        CABG with MI in 2005  . Heart disease Mother        CV  . Hypertension Father   . Heart attack Father        CABG with Mi around 1997  . Coronary artery disease Father   . Heart disease Father        CV  . Diabetes Father   . Heart attack Brother        MI/PTCA  . Heart disease Brother        CV  . Diabetes Brother   . Heart attack Maternal Grandfather        MI  . Prostate cancer Paternal Grandfather   . Diabetes Sister        DM  . Breast cancer Neg Hx        Breast/ovarian/uterine cancer  . Colon cancer Neg Hx   . Depression Neg Hx   . Alcohol abuse Neg Hx        ETOH/drug abuse  . Stroke Neg Hx     Social History:  reports that he quit smoking about 4 years  ago. His smoking use included cigarettes. He has a 36.00 pack-year smoking history. He has never used smokeless tobacco. He reports current alcohol use. He reports that he does not use drugs.  ROS: UROLOGY Frequent Urination?: No Hard to postpone urination?: No Burning/pain with urination?: No Get up at night to urinate?: No Leakage of urine?: No Urine stream starts and stops?: No Trouble starting stream?: No Do you have to strain to urinate?: No Blood in urine?: No Urinary tract infection?: No Sexually transmitted disease?: No Injury to kidneys or bladder?: No Painful intercourse?: No Weak stream?: No Erection problems?: No Penile pain?: No  Gastrointestinal Nausea?: No Vomiting?: No Indigestion/heartburn?: No Diarrhea?: No Constipation?: No  Constitutional Fever: No Night sweats?: No Weight loss?: Yes Fatigue?: No  Skin Skin rash/lesions?: No Itching?: No  Eyes Blurred vision?: No Double vision?: No  Ears/Nose/Throat Sore throat?: No Sinus problems?: No  Hematologic/Lymphatic Swollen glands?: No Easy bruising?: No  Cardiovascular Leg swelling?: No Chest pain?: No  Respiratory Cough?: No Shortness of breath?: No  Endocrine Excessive thirst?: No  Musculoskeletal Back pain?: No Joint pain?: No  Neurological Headaches?: No Dizziness?: No  Psychologic Depression?: No Anxiety?: No  Physical Exam: BP 124/66 (BP Location: Left Arm, Patient Position: Sitting)   Pulse 74   Ht 5\' 9"  (1.753 m)   Wt 227 lb (103 kg)   BMI 33.52 kg/m   Constitutional:  Alert and oriented, No acute distress. HEENT: [place] AT, moist mucus membranes.  Trachea midline, no masses. Cardiovascular: No clubbing, cyanosis, or edema. Respiratory: Normal respiratory effort, no increased work of breathing. Skin: No rashes, bruises or suspicious lesions. Neurologic: Grossly intact, no focal deficits, moving all 4 extremities. Psychiatric: Normal mood and affect.   Assessment  & Plan:   68 year old male with castrate sensitive metastatic prostate cancer on ADT.  His most recent PSA was detectable at 0.2.  None I did discuss recent approval of enzalutamide and apalutamide for castrate sensitive metastatic prostate cancer.  He has previously seen GU oncology at [facility] and discussed a follow-up visit there.  I also discussed repeating his PSA in 3 to 4 months and referral if it is continuing to rise.  He has elected the latter and will follow-up in 3-4 months for PSA.    Abbie Sons, [place] [address], [place], [place] [address] [phone]

## 2018-10-14 ENCOUNTER — Other Ambulatory Visit: Payer: Self-pay | Admitting: Cardiovascular Disease

## 2018-10-15 ENCOUNTER — Telehealth: Payer: Self-pay

## 2018-10-15 NOTE — Telephone Encounter (Signed)
-----   Message from Horton Finer sent at 10/14/2018 11:45 AM EST ----- Regarding: appointment for refills Patient needs an appointment for refills

## 2018-10-15 NOTE — Telephone Encounter (Signed)
No ans no vm   °

## 2018-10-21 NOTE — Telephone Encounter (Signed)
No ans no vm   °

## 2018-10-30 ENCOUNTER — Ambulatory Visit (INDEPENDENT_AMBULATORY_CARE_PROVIDER_SITE_OTHER): Payer: Medicare Other | Admitting: Internal Medicine

## 2018-10-30 ENCOUNTER — Other Ambulatory Visit: Payer: Self-pay

## 2018-10-30 ENCOUNTER — Encounter: Payer: Self-pay | Admitting: Internal Medicine

## 2018-10-30 VITALS — BP 118/70 | HR 66 | Temp 98.6°F | Ht 69.0 in | Wt 232.0 lb

## 2018-10-30 DIAGNOSIS — E785 Hyperlipidemia, unspecified: Secondary | ICD-10-CM | POA: Diagnosis not present

## 2018-10-30 DIAGNOSIS — E1165 Type 2 diabetes mellitus with hyperglycemia: Secondary | ICD-10-CM

## 2018-10-30 DIAGNOSIS — Z6839 Body mass index (BMI) 39.0-39.9, adult: Secondary | ICD-10-CM | POA: Diagnosis not present

## 2018-10-30 DIAGNOSIS — E1159 Type 2 diabetes mellitus with other circulatory complications: Secondary | ICD-10-CM

## 2018-10-30 LAB — POCT GLYCOSYLATED HEMOGLOBIN (HGB A1C): Hemoglobin A1C: 7.8 % — AB (ref 4.0–5.6)

## 2018-10-30 MED ORDER — INSULIN DEGLUDEC 200 UNIT/ML ~~LOC~~ SOPN: 20.0000 [IU] | PEN_INJECTOR | Freq: Every day | SUBCUTANEOUS | 5 refills | Status: DC

## 2018-10-30 NOTE — Addendum Note (Signed)
Addended by: Cardell Peach I on: 10/30/2018 11:31 AM   Modules accepted: Orders

## 2018-10-30 NOTE — Patient Instructions (Addendum)
Please continue: - Invokana 300 mg bfore b'fast - Trulicity 1.5 mg weekly.  Please start: - Tresiba 20 units daily (in the first night, inject 14 units)  Please return in 3-4 months with your sugar log.

## 2018-10-30 NOTE — Progress Notes (Signed)
Patient ID: Phillip Clements, male   DOB: 17-May-1951, 68 y.o.   MRN: [id number]  HPI: Phillip Clements is a 68 y.o.-year-old male, returning for f/u for DM2, dx in 1996, non-insulin-dependent since 2008, uncontrolled, with complications (CAD - s/p stents, CABG, cerebro-vascular ds - s/p CVA, PVD). Last visit 3 months ago. He changed to [facility] in 10/2016.  At last visit, sugars and HbA1c were higher as he relaxed his diet.  We added Trulicity then.  His PSA has become detectable again (he is stressed about this) >> will have a repeat in 01/2019.  Last hemoglobin A1c was: Lab Results  Component Value Date   HGBA1C 7.9 (A) 07/24/2018   HGBA1C 5.6 04/15/2018   HGBA1C 7.8 12/09/2017   Pt was on a regimen of: - Invokana 300 mg before b'fast - Trulicity 1.5 mg weekly - Novolog 40 units 3x a day - 15 min before meals - Toujeo 120  units (60 x2) after dinner He tried Victoza and Januvia. Had GI intolerance (nausea, diarrhea) to regular Metformin and also with metformin ER >>stopped.  He was then on: - Invokana 300 mg before breakfast - Trulicity 1.5 mg weekly - U500 insulin 55-70 units 3 times a day before meals Also, if sugars in the morning are: - 160-200, take 10 units - 201-240, take 15 units - >240, take 20 units  He is currently on: - Invokana 300 mg before breakfast - Trulicity 1.5 mg weekly -restarted 07/2018  At last visit, he was not checking sugars consistently, only seldom.  He is now checking more frequently and sugars are higher: - am: 132-239, 286 >> 120s, 178 >> 200 - 2h after b'fast: n/c - not eating b'fast - before lunch 150-212, 231 >> 100-120 >> n/c - 2h after lunch: n/c - before dinner: 89, 163-288 >> 100-120 >> 160s - 2h after dinner: n/c - bedtime: 201, 214 >> 128-150 >> n/c - nighttime: n/c Lowest sugar was 56 (on insulin) >> 61 >> 110; he has hypoglycemia awareness in the 70s. Highest sugar was  445 >> 178 >> 223 >> 200s.  Glucometer: OneTouch Ultra  mini  Pt's meals are mostly plant-based: - Breakfast:  skips - Lunch: sandwich, chips, cottage cheese, pineapple - Dinner: 2 veggies, salads; Sat night: meat - Snacks: 2/day: celery + PB; low salt triscuit + laughing cow cheese  He continues to walk on the treadmill 5/7 days for 30 minutes and he lifts weights for 2/7 days.  -+ Mild CKD, last BUN/creatinine:  Lab Results  Component Value Date   BUN 23 04/04/2017   CREATININE 0.99 04/04/2017  On losartan. -+ HL; last set of lipids: Lab Results  Component Value Date   CHOL 89 04/08/2018   HDL 35 (L) 04/08/2018   LDLCALC 17 04/08/2018   LDLDIRECT 157.1 09/07/2013   TRIG 185 (H) 04/08/2018   CHOLHDL 2.5 04/08/2018  He could not tolerate statins in the past due to joint pain.  He is on Repatha and Zetia now. - last eye exam was in 05/2018: No DR; + history of cataract surgery. - no numbness and tingling in his feet.  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  He continues Lupron.  ROS: Constitutional: no weight gain/no weight loss, no fatigue, no subjective hyperthermia, no subjective hypothermia Eyes: no blurry vision, no xerophthalmia ENT: no sore throat, no nodules palpated in neck, no dysphagia, no odynophagia, no hoarseness Cardiovascular: no CP/no SOB/no palpitations/no leg swelling Respiratory: no cough/no SOB/no wheezing  Gastrointestinal: no N/no V/no D/no C/no acid reflux Musculoskeletal: no muscle aches/no joint aches Skin: no rashes, no hair loss Neurological: no tremors/no numbness/no tingling/no dizziness  I reviewed pt's medications, allergies, PMH, social hx, family hx, and changes were documented in the history of present illness. Otherwise, unchanged from my initial visit note.  Past Medical History:  Diagnosis Date  . Cataract    bilateral eye in 2010  . Coronary artery disease    a. CABG 04/2014: (LIMA--> LAD, SVG --> DIAG, SVG--> OM1, SVG--> PDA)  b. early graft failure. s/p successful rotational  atherectomy and stenting of the left main and proximal LAD with DES. DES of mid LAD.(07/22/14)  . CVA (cerebral infarction)   . Hx of adenomatous colonic polyps 06/17/2017  . Hyperlipidemia   . Hypertension 2000  . OSA on CPAP   . Peripheral vascular disease ([place])    left SFA angioplasty in December 2016  . Prostate cancer ([place])   . Sleep apnea    wears CPAP nightly  . Stroke ([place])   . Type II diabetes mellitus ([place])    Past Surgical History:  Procedure Laterality Date  . ANTERIOR CERVICAL DECOMP/DISCECTOMY FUSION  08/25/2012   Procedure: ANTERIOR CERVICAL DECOMPRESSION/DISCECTOMY FUSION 2 LEVELS;  Surgeon: Hosie Spangle, MD;  Location: [place] [facility];  Service: Neurosurgery;  Laterality: N/A;  Cervical five-six Cervical six-seven anterior cervical decompression with fusion and plating and bonegraft  . BACK SURGERY    . CARDIAC CATHETERIZATION  04/2014; 07/19/2014  . CORONARY ARTERY BYPASS GRAFT N/A 04/26/2014   Procedure: CORONARY ARTERY BYPASS GRAFTING (CABG), on pump, times four, using left internal mammary artery, right greater saphenous vein harvested endoscopically.;  Surgeon: Ivin Poot, MD;  Location: [place];  Service: Open Heart Surgery;  Laterality: N/A;  LIMA to LAD, SVG to DIAGONAL, SVG to OM1, SVG to PDA) with EVH of the RIGHT THIGH and LOWER EXTREMITY SAPHENOUS VEIN  . Cytoscopy prostatic stone o/w nml  06/21/08   Dr. Jacqlyn Larsen  . ETT myoview  09/13/09   Low risk EF 49%  . High intense focused ultrasound  07/20/06   By Dr. Jacqlyn Larsen  . INTRAOPERATIVE TRANSESOPHAGEAL ECHOCARDIOGRAM N/A 04/26/2014   Procedure: INTRAOPERATIVE TRANSESOPHAGEAL ECHOCARDIOGRAM;  Surgeon: Ivin Poot, MD;  Location: [place];  Service: Open Heart Surgery;  Laterality: N/A;  . PERCUTANEOUS CORONARY ROTOBLATOR INTERVENTION (PCI-R) N/A 07/22/2014   Procedure: PERCUTANEOUS CORONARY ROTOBLATOR INTERVENTION (PCI-R);  Surgeon: Peter M Martinique, MD;  Location[facility] CATH LAB;  Service: Cardiovascular;  Laterality: N/A;   . PERIPHERAL VASCULAR CATHETERIZATION Left 07/26/2015   Procedure: Lower Extremity Angiography;  Surgeon: Katha Cabal, MD;  Location: [place] CV LAB;  Service: Cardiovascular;  Laterality: Left;  . PERIPHERAL VASCULAR CATHETERIZATION  07/26/2015   Procedure: Lower Extremity Intervention;  Surgeon: Katha Cabal, MD;  Location: [place] CV LAB;  Service: Cardiovascular;;  . PROSTATE BIOPSY  04/03/06 & 02/25/07   Social History   Socioeconomic History  . Marital status: Married    Spouse name: Not on file  . Number of children: 3  . Years of education: Not on file  . Highest education level: Not on file  Occupational History  . Occupation: Insurance account manager  Social Needs  . Financial resource strain: Not on file  . Food insecurity:    Worry: Not on file    Inability: Not on file  . Transportation needs:    Medical: Not on file  Non-medical: Not on file  Tobacco Use  . Smoking status: Former Smoker    Packs/day: 1.00    Years: 36.00    Pack years: 36.00    Types: Cigarettes    Last attempt to quit: 03/01/2014    Years since quitting: 4.6  . Smokeless tobacco: Never Used  Substance and Sexual Activity  . Alcohol use: Yes    Comment: 07/19/2014 "might have a drink a couple times/yr"  . Drug use: No  . Sexual activity: Not Currently  Lifestyle  . Physical activity:    Days per week: Not on file    Minutes per session: Not on file  . Stress: Not on file  Relationships  . Social connections:    Talks on phone: Not on file    Gets together: Not on file    Attends religious service: Not on file    Active member of club or organization: Not on file    Attends meetings of clubs or organizations: Not on file    Relationship status: Not on file  . Intimate partner violence:    Fear of current or ex partner: Not on file    Emotionally abused: Not on file    Physically abused: Not on file    Forced sexual activity: Not on file   Other Topics Concern  . Not on file  Social History Narrative   Lives with wife.   2 daughters and 1 son.   [employer]   Former offensive lineman.    Current Outpatient Medications on File Prior to Visit  Medication Sig Dispense Refill  . aspirin EC 81 MG tablet Take 1 tablet (81 mg total) by mouth daily. 90 tablet 3  . B Complex Vitamins (VITAMIN B COMPLEX PO) Take by mouth.    . B-D ULTRAFINE III SHORT PEN 31G X 8 MM MISC USE AS DIRECTED TWICE DAILY 100 each 11  . bicalutamide (CASODEX) 50 MG tablet Take 1 tablet (50 mg total) by mouth daily. 90 tablet 2  . carvedilol (COREG) 3.125 MG tablet TAKE 1 TABLET BY MOUTH TWICE A DAY WITH MEALS 60 tablet 3  . clopidogrel (PLAVIX) 75 MG tablet TAKE 1 TABLET BY MOUTH ONCE A DAY 30 tablet 11  . Dulaglutide (TRULICITY) 1.5 XT/0.2IO SOPN Inject 1.5 mg into the skin once a week. 12 pen 3  . ezetimibe (ZETIA) 10 MG tablet TAKE 1 TABLET BY MOUTH ONCE DAILY 30 tablet 5  . glucose blood (ONE TOUCH ULTRA TEST) test strip Check blood sugar twice a day and as directed Dx E11.59 200 each 3  . Injection Device (CORNWALL METAL PIPETTING) MISC Frequency:PHARMDIR   Dosage:0.0     Instructions:  Note:diagnosis 250.02 Dose: NA    . INVOKANA 300 MG TABS tablet TAKE 1 TABLET BY MOUTH ONCE DAILY WITH BREAKFAST 30 tablet 11  . lansoprazole (PREVACID) 15 MG capsule Take 15 mg by mouth daily at 12 noon.    Marland Kitchen leuprolide (LUPRON) 30 MG injection Inject 45 mg into the muscle every 6 (six) months.    Marland Kitchen losartan (COZAAR) 25 MG tablet TAKE 1/2 TABLET BY MOUTH ONCE A DAY 15 tablet 5  . NITROSTAT 0.4 MG SL tablet Place 1 tablet (0.4 mg total) under the tongue as needed. 25 tablet 3  . REPATHA SURECLICK 973 MG/ML SOAJ INJECT 1 PEN INTO THE SKIN EVERY 14 DAYS 2 mL 0  . UNIFINE PENTIPS 29G X 12MM MISC USE AS DIRECTED TWICE  DAILY 100 each 5   No current facility-administered medications on file prior to visit.    Allergies  Allergen Reactions  . Coreg  [Carvedilol] Other (See Comments)    Fatigue- unclear if this was due to coreg specifically or beta blockers in general.    . Metformin And Related Nausea Only  . Protonix [Pantoprazole Sodium] Diarrhea  . Rosuvastatin Calcium     REACTION: JOINT ACHES  . Statins     REACTION: JOINT ACHES   Family History  Problem Relation Age of Onset  . Diabetes Mother 2       DM  . Coronary artery disease Mother   . Hypertension Mother   . Heart attack Mother        CABG with MI in 2005  . Heart disease Mother        CV  . Hypertension Father   . Heart attack Father        CABG with Mi around 1997  . Coronary artery disease Father   . Heart disease Father        CV  . Diabetes Father   . Heart attack Brother        MI/PTCA  . Heart disease Brother        CV  . Diabetes Brother   . Heart attack Maternal Grandfather        MI  . Prostate cancer Paternal Grandfather   . Diabetes Sister        DM  . Breast cancer Neg Hx        Breast/ovarian/uterine cancer  . Colon cancer Neg Hx   . Depression Neg Hx   . Alcohol abuse Neg Hx        ETOH/drug abuse  . Stroke Neg Hx    PE: BP 118/70   Pulse 66   Temp 98.6 F (37 C)   Ht 5\' 9"  (1.753 m)   Wt 232 lb (105.2 kg)   SpO2 97%   BMI 34.26 kg/m  Body mass index is 34.26 kg/m. Wt Readings from Last 3 Encounters:  10/30/18 232 lb (105.2 kg)  10/03/18 227 lb (103 kg)  08/28/18 230 lb (104.3 kg)   Constitutional: overweight, in NAD Eyes: PERRLA, EOMI, no exophthalmos ENT: moist mucous membranes, no thyromegaly, no cervical lymphadenopathy Cardiovascular: RRR, No MRG Respiratory: CTA B Gastrointestinal: abdomen soft, NT, ND, BS+ Musculoskeletal: no deformities, strength intact in all 4 Skin: moist, warm, no rashes except stasis dermatitis in bilateral legs Neurological: no tremor with outstretched hands, DTR normal in all 4  ASSESSMENT: 1. DM2, insulin-dependent, uncontrolled, with complications - CAD - s/p stents, CABG -  cerebro-vascular ds - s/p CVA  - PVD - s/p L stent  2. HL  3.  Obesity class II BMI Classification:  < 18.5 underweight   18.5-24.9 normal weight   25.0-29.9 overweight   30.0-34.9 class I obesity   35.0-39.9 class II obesity   ? 40.0 class III obesity   PLAN:  1. Patient with longstanding, previously uncontrolled, type 2 diabetes.  His diabetes was very insulin resistant and we have to use U500 insulin with Trulicity.  However, after he started on a keto diet, he lost a significant amount of weight (35 pounds), and his sugars improved significantly so he was able to come off his insulin and also his GLP-1 receptor agonist.  At last visit he was only on Invokana, however, he did relax his diet during Thanksgiving and around the time of  his birthday so sugars were higher.  We added back Trulicity at that time.  He is now on Trulicity and Invokana.  No side effects from either of the 2. -At this visit, sugars are higher compared to the past, however, he was not checking sugars at last visit.  We discussed that we absolutely need to continue to improve his diet and I recommended a more plant-based diet.  I explained the benefits of this on both diabetes control, and his weight.  Moreover, this can also be beneficial to limit cancer spread. -However, since sugars are higher now at all times of the day, we decided to start basal insulin.  I do not feel he needs U500 insulin for now.  I suggested [place] U200.  I am hoping this is covered by his insurance. - I suggested to: Patient Instructions  Please continue: - Invokana 300 mg bfore b'fast - Trulicity 1.5 mg weekly.  Please start: - Tresiba 20 units daily (in the first night, inject 14 units)  Please return in 3-4 months with your sugar log.    - today, HbA1c is 7.8% (slightly lower) - continue checking sugars at different times of the day - check 1x a day, rotating checks - advised for yearly eye exams >> he is UTD - Return to  clinic in 3-4 mo with sugar log   2. HL - Reviewed latest lipid panel from 03/2014: High triglycerides and low HDL, but very low LDL. Lab Results  Component Value Date   CHOL 89 04/08/2018   HDL 35 (L) 04/08/2018   LDLCALC 17 04/08/2018   LDLDIRECT 157.1 09/07/2013   TRIG 185 (H) 04/08/2018   CHOLHDL 2.5 04/08/2018  -He is intolerant to statins, however, he is on Repatha and ezetimibe.  He has no side effects from these medications.  3. Obesity -Also contributed by the Lupron injections -In the past, she did wonderful on a keto diet, losing 35 pounds.  At last visit, we discussed about continuing the similar diet but with more carbs -He lost 5 pounds before last visit.  At that time, we restarted Trulicity.  He only gained 1 pound since last visit. -Unfortunately, at this visit we need to start insulin, which will cause some weight gain.  We did discuss again about improving his diet. -We will continue Invokana and Trulicity which should also help with weight loss  Philemon Kingdom, MD PhD [facility]

## 2018-11-04 ENCOUNTER — Other Ambulatory Visit: Payer: Self-pay | Admitting: Cardiovascular Disease

## 2018-11-11 NOTE — Telephone Encounter (Signed)
Patient declined evisit .  Scheduled 5-7 with Sharolyn Douglas

## 2018-11-27 ENCOUNTER — Ambulatory Visit: Payer: Medicare Other

## 2018-11-28 ENCOUNTER — Ambulatory Visit: Payer: Medicare Other

## 2018-11-28 ENCOUNTER — Ambulatory Visit (INDEPENDENT_AMBULATORY_CARE_PROVIDER_SITE_OTHER): Payer: Medicare Other

## 2018-11-28 ENCOUNTER — Other Ambulatory Visit (INDEPENDENT_AMBULATORY_CARE_PROVIDER_SITE_OTHER): Payer: Medicare Other

## 2018-11-28 ENCOUNTER — Other Ambulatory Visit: Payer: Self-pay

## 2018-11-28 ENCOUNTER — Other Ambulatory Visit: Payer: Self-pay | Admitting: Family Medicine

## 2018-11-28 ENCOUNTER — Other Ambulatory Visit: Payer: Self-pay | Admitting: Cardiovascular Disease

## 2018-11-28 DIAGNOSIS — Z0000 Encounter for general adult medical examination without abnormal findings: Secondary | ICD-10-CM | POA: Diagnosis not present

## 2018-11-28 DIAGNOSIS — I10 Essential (primary) hypertension: Secondary | ICD-10-CM | POA: Diagnosis not present

## 2018-11-28 DIAGNOSIS — E785 Hyperlipidemia, unspecified: Secondary | ICD-10-CM

## 2018-11-28 DIAGNOSIS — R079 Chest pain, unspecified: Secondary | ICD-10-CM

## 2018-11-28 DIAGNOSIS — I2510 Atherosclerotic heart disease of native coronary artery without angina pectoris: Secondary | ICD-10-CM

## 2018-11-28 LAB — CBC WITH DIFFERENTIAL/PLATELET
Basophils Absolute: 0 10*3/uL (ref 0.0–0.1)
Basophils Relative: 0.4 % (ref 0.0–3.0)
Eosinophils Absolute: 0.1 10*3/uL (ref 0.0–0.7)
Eosinophils Relative: 2.5 % (ref 0.0–5.0)
HCT: 43.9 % (ref 39.0–52.0)
Hemoglobin: 15.4 g/dL (ref 13.0–17.0)
Lymphocytes Relative: 35.6 % (ref 12.0–46.0)
Lymphs Abs: 2.1 10*3/uL (ref 0.7–4.0)
MCHC: 35.1 g/dL (ref 30.0–36.0)
MCV: 85.5 fl (ref 78.0–100.0)
Monocytes Absolute: 0.5 10*3/uL (ref 0.1–1.0)
Monocytes Relative: 8.2 % (ref 3.0–12.0)
Neutro Abs: 3.1 10*3/uL (ref 1.4–7.7)
Neutrophils Relative %: 53.3 % (ref 43.0–77.0)
Platelets: 212 10*3/uL (ref 150.0–400.0)
RBC: 5.13 Mil/uL (ref 4.22–5.81)
RDW: 13.1 % (ref 11.5–15.5)
WBC: 5.9 10*3/uL (ref 4.0–10.5)

## 2018-11-28 LAB — COMPREHENSIVE METABOLIC PANEL
ALT: 13 U/L (ref 0–53)
AST: 12 U/L (ref 0–37)
Albumin: 4.2 g/dL (ref 3.5–5.2)
Alkaline Phosphatase: 60 U/L (ref 39–117)
BUN: 21 mg/dL (ref 6–23)
CO2: 28 mEq/L (ref 19–32)
Calcium: 9 mg/dL (ref 8.4–10.5)
Chloride: 104 mEq/L (ref 96–112)
Creatinine, Ser: 0.97 mg/dL (ref 0.40–1.50)
GFR: 77.11 mL/min (ref >60.00)
Glucose, Bld: 153 mg/dL — ABNORMAL HIGH (ref 70–99)
Potassium: 4.3 mEq/L (ref 3.5–5.1)
Sodium: 139 mEq/L (ref 135–145)
Total Bilirubin: 0.9 mg/dL (ref 0.2–1.2)
Total Protein: 6.7 g/dL (ref 6.0–8.3)

## 2018-11-28 LAB — LIPID PANEL
Cholesterol: 156 mg/dL (ref 0–200)
HDL: 40.6 mg/dL (ref >39.00)
LDL Cholesterol: 92 mg/dL (ref 0–99)
NonHDL: 115.71
Total CHOL/HDL Ratio: 4
Triglycerides: 119 mg/dL (ref 0.0–149.0)
VLDL: 23.8 mg/dL (ref 0.0–40.0)

## 2018-11-28 NOTE — Progress Notes (Signed)
Subjective:   Phillip Clements is a 68 y.o. male who presents for an Initial Medicare Annual Wellness Visit.  Review of Systems  N/A Cardiac Risk Factors include: advanced age (>29men, >46 women);male gender;obesity (BMI >30kg/m2)    Objective:    Today's Vitals   11/28/18 1434  PainSc: 0-No pain   There is no height or weight on file to calculate BMI.  Advanced Directives 11/28/2018 06/11/2017 10/05/2016 07/19/2014 04/28/2014 04/22/2014 08/25/2012  Does Patient Have a Medical Advance Directive? Yes Yes Yes No Yes Yes Patient has advance directive, copy not in chart  Type of Advance Directive [place];Living will [place];Living will [place];Living will -  Copy of [place] in Chart? No - copy requested - - - No - copy requested No - copy requested -  Would patient like information on creating a medical advance directive? No - Patient declined - - No - patient declined information - - -  Pre-existing out of facility DNR order (yellow form or pink MOST form) - - - - - - No    Current Medications (verified) Outpatient Encounter Medications as of 11/28/2018  Medication Sig  . aspirin EC 81 MG tablet Take 1 tablet (81 mg total) by mouth daily.  . B Complex Vitamins (VITAMIN B COMPLEX PO) Take by mouth.  . B-D ULTRAFINE III SHORT PEN 31G X 8 MM MISC USE AS DIRECTED TWICE DAILY  . bicalutamide (CASODEX) 50 MG tablet Take 1 tablet (50 mg total) by mouth daily.  . carvedilol (COREG) 3.125 MG tablet TAKE 1 TABLET BY MOUTH TWICE A DAY  . clopidogrel (PLAVIX) 75 MG tablet TAKE 1 TABLET BY MOUTH ONCE A DAY  . Dulaglutide (TRULICITY) 1.5 HY/0.7PX SOPN Inject 1.5 mg into the skin once a week.  . ezetimibe (ZETIA) 10 MG tablet TAKE 1 TABLET BY MOUTH ONCE DAILY  . glucose blood (ONE TOUCH ULTRA TEST) test strip Check blood sugar twice a day and as directed Dx E11.59  . Injection Device  (CORNWALL METAL PIPETTING) MISC Frequency:PHARMDIR   Dosage:0.0     Instructions:  Note:diagnosis 250.02 Dose: NA  . Insulin Degludec (TRESIBA FLEXTOUCH) 200 UNIT/ML SOPN Inject 20 Units into the skin daily.  . INVOKANA 300 MG TABS tablet TAKE 1 TABLET BY MOUTH ONCE DAILY WITH BREAKFAST  . lansoprazole (PREVACID) 15 MG capsule Take 15 mg by mouth daily at 12 noon.  Marland Kitchen leuprolide (LUPRON) 30 MG injection Inject 45 mg into the muscle every 6 (six) months.  Marland Kitchen losartan (COZAAR) 25 MG tablet TAKE 1/2 TABLET BY MOUTH ONCE A DAY  . NITROSTAT 0.4 MG SL tablet Place 1 tablet (0.4 mg total) under the tongue as needed.  Marland Kitchen REPATHA SURECLICK 106 MG/ML SOAJ INHECT 1 PEN INTO THE SKIN EVERY 14 DAYS  . UNIFINE PENTIPS 29G X 12MM MISC USE AS DIRECTED TWICE DAILY   No facility-administered encounter medications on file as of 11/28/2018.     Allergies (verified) Coreg [carvedilol]; Metformin and related; Protonix [pantoprazole sodium]; Rosuvastatin calcium; and Statins   History: Past Medical History:  Diagnosis Date  . Cataract    bilateral eye in 2010  . Coronary artery disease    a. CABG 04/2014: (LIMA--> LAD, SVG --> DIAG, SVG--> OM1, SVG--> PDA)  b. early graft failure. s/p successful rotational atherectomy and stenting of the left main and proximal LAD with DES. DES of mid LAD.(07/22/14)  . CVA (  cerebral infarction)   . Hx of adenomatous colonic polyps 06/17/2017  . Hyperlipidemia   . Hypertension 2000  . OSA on CPAP   . Peripheral vascular disease ([place])    left SFA angioplasty in December 2016  . Prostate cancer ([facility])   . Sleep apnea    wears CPAP nightly  . Stroke ([place])   . Type II diabetes mellitus ([place])    Past Surgical History:  Procedure Laterality Date  . ANTERIOR CERVICAL DECOMP/DISCECTOMY FUSION  08/25/2012   Procedure: ANTERIOR CERVICAL DECOMPRESSION/DISCECTOMY FUSION 2 LEVELS;  Surgeon: Hosie Spangle, MD;  Location: [facility]URO [facility]S;  Service: Neurosurgery;  Laterality: N/A;  Cervical  five-six Cervical six-seven anterior cervical decompression with fusion and plating and bonegraft  . BACK SURGERY    . CARDIAC CATHETERIZATION  04/2014; 07/19/2014  . CORONARY ARTERY BYPASS GRAFT N/A 04/26/2014   Procedure: CORONARY ARTERY BYPASS GRAFTING (CABG), on pump, times four, using left internal mammary artery, right greater saphenous vein harvested endoscopically.;  Surgeon: Ivin Poot, MD;  Location: [facility];  Service: Open Heart Surgery;  Laterality: N/A;  LIMA to LAD, SVG to DIAGONAL, SVG to OM1, SVG to PDA) with EVH of the RIGHT THIGH and LOWER EXTREMITY SAPHENOUS VEIN  . Cytoscopy prostatic stone o/w nml  06/21/08   Dr. Jacqlyn Larsen  . ETT myoview  09/13/09   Low risk EF 49%  . High intense focused ultrasound  07/20/06   By Dr. Jacqlyn Larsen  . INTRAOPERATIVE TRANSESOPHAGEAL ECHOCARDIOGRAM N/A 04/26/2014   Procedure: INTRAOPERATIVE TRANSESOPHAGEAL ECHOCARDIOGRAM;  Surgeon: Ivin Poot, MD;  Location: [facility];  Service: Open Heart Surgery;  Laterality: N/A;  . PERCUTANEOUS CORONARY ROTOBLATOR INTERVENTION (PCI-R) N/A 07/22/2014   Procedure: PERCUTANEOUS CORONARY ROTOBLATOR INTERVENTION (PCI-R);  Surgeon: Peter M Martinique, MD;  Location[facility] CATH LAB;  Service: Cardiovascular;  Laterality: N/A;  . PERIPHERAL VASCULAR CATHETERIZATION Left 07/26/2015   Procedure: Lower Extremity Angiography;  Surgeon: Katha Cabal, MD;  Location: [facility] CV LAB;  Service: Cardiovascular;  Laterality: Left;  . PERIPHERAL VASCULAR CATHETERIZATION  07/26/2015   Procedure: Lower Extremity Intervention;  Surgeon: Katha Cabal, MD;  Location: [facility] CV LAB;  Service: Cardiovascular;;  . PROSTATE BIOPSY  04/03/06 & 02/25/07   Family History  Problem Relation Age of Onset  . Diabetes Mother 25       DM  . Coronary artery disease Mother   . Hypertension Mother   . Heart attack Mother        CABG with MI in 2005  . Heart disease Mother        CV  . Hypertension Father   . Heart attack Father         CABG with Mi around 1997  . Coronary artery disease Father   . Heart disease Father        CV  . Diabetes Father   . Heart attack Brother        MI/PTCA  . Heart disease Brother        CV  . Diabetes Brother   . Heart attack Maternal Grandfather        MI  . Prostate cancer Paternal Grandfather   . Diabetes Sister        DM  . Breast cancer Neg Hx        Breast/ovarian/uterine cancer  . Colon cancer Neg Hx   . Depression Neg Hx   . Alcohol abuse Neg Hx  ETOH/drug abuse  . Stroke Neg Hx    Social History   Socioeconomic History  . Marital status: Married    Spouse name: Not on file  . Number of children: 3  . Years of education: Not on file  . Highest education level: Not on file  Occupational History  . Occupation: Insurance account manager  Social Needs  . Financial resource strain: Not on file  . Food insecurity:    Worry: Not on file    Inability: Not on file  . Transportation needs:    Medical: Not on file    Non-medical: Not on file  Tobacco Use  . Smoking status: Former Smoker    Packs/day: 1.00    Years: 36.00    Pack years: 36.00    Types: Cigarettes    Last attempt to quit: 03/01/2014    Years since quitting: 4.7  . Smokeless tobacco: Never Used  Substance and Sexual Activity  . Alcohol use: Yes    Comment: 07/19/2014 "might have a drink a couple times/yr"  . Drug use: No  . Sexual activity: Not Currently  Lifestyle  . Physical activity:    Days per week: Not on file    Minutes per session: Not on file  . Stress: Not on file  Relationships  . Social connections:    Talks on phone: Not on file    Gets together: Not on file    Attends religious service: Not on file    Active member of club or organization: Not on file    Attends meetings of clubs or organizations: Not on file    Relationship status: Not on file  Other Topics Concern  . Not on file  Social History Narrative   Lives with wife.   2 daughters and 1  son.   [employer]   Former offensive lineman.    Tobacco Counseling Counseling given: No   Clinical Intake:  Pre-visit preparation completed: Yes  Pain : No/denies pain Pain Score: 0-No pain     Nutritional Status: BMI > 30  Obese Nutritional Risks: None Diabetes: Yes CBG done?: No Did pt. bring in CBG monitor from home?: No  How often do you need to have someone help you when you read instructions, pamphlets, or other written materials from your doctor or pharmacy?: 1 - Never What is the last grade level you completed in school?: Bachelor degree  Interpreter Needed?: No  Comments: pt lives with spouse Information entered by :: LPinson, LPN  Activities of Daily Living In your present state of health, do you have any difficulty performing the following activities: 11/28/2018  Hearing? N  Vision? N  Difficulty concentrating or making decisions? N  Walking or climbing stairs? N  Dressing or bathing? N  Doing errands, shopping? N  Preparing Food and eating ? N  Using the Toilet? N  In the past six months, have you accidently leaked urine? Y  Do you have problems with loss of bowel control? N  Managing your Medications? N  Managing your Finances? N  Housekeeping or managing your Housekeeping? N  Some recent data might be hidden     Immunizations and Health Maintenance Immunization History  Administered Date(s) Administered  . Influenza, High Dose Seasonal PF 06/07/2017, 04/15/2018  . Influenza,inj,Quad PF,6+ Mos 05/01/2013, 05/02/2014, 05/18/2016  . Td 08/13/1998   Health Maintenance Due  Topic Date Due  . Hepatitis C Screening  Oct 02, 1950  .  TETANUS/TDAP  08/13/2008  . FOOT EXAM  10/21/2015  . PNA vac Low Risk Adult (1 of 2 - PCV13) 07/04/2016    Patient Care Team: Tonia Ghent, MD as PCP - General (Family Medicine)  Indicate any recent Medical Services you may have received from other than [facility] providers in the past year (date  may be approximate).    Assessment:   This is a routine wellness examination for Phillip Clements.  Hearing/Vision screen Vision Screening Comments: Last vision exam in Jan 2020   Dietary issues and exercise activities discussed: Current Exercise Habits: Home exercise routine, Type of exercise: Other - see comments;walking(golf), Time (Minutes): 30, Frequency (Times/Week): 7, Weekly Exercise (Minutes/Week): 210, Exercise limited by: None identified  Goals    . Patient Stated     Starting 11/28/2018, I will continue to take medications as prescribed.       Depression Screen PHQ 2/9 Scores 11/28/2018 10/05/2016  PHQ - 2 Score 0 0  PHQ- 9 Score 0 -    Fall Risk Fall Risk  11/28/2018 06/27/2018 10/05/2016  Falls in the past year? 0 0 No  Comment - Emmi Telephone Survey: data to providers prior to load -  ognitive Function: MMSE - Mini Mental State Exam 11/28/2018  Not completed: Unable to complete        Screening Tests Health Maintenance  Topic Date Due  . Hepatitis C Screening  [birth year]  . TETANUS/TDAP  08/13/2008  . FOOT EXAM  10/21/2015  . PNA vac Low Risk Adult (1 of 2 - PCV13) 07/04/2016  . INFLUENZA VACCINE  03/14/2019  . HEMOGLOBIN A1C  05/02/2019  . OPHTHALMOLOGY EXAM  05/23/2019  . COLONOSCOPY  06/11/2020      Plan:     I have personally reviewed, addressed, and noted the following in the patient's chart:  A. Medical and social history B. Use of alcohol, tobacco or illicit drugs  C. Current medications and supplements D. Functional ability and status E.  Nutritional status F.  Physical activity G. Advance directives H. List of other physicians I.  Hospitalizations, surgeries, and ER visits in previous 12 months J.  Vitals (unless it is a telemedicine encounter) K. Screenings to include hearing, vision, cognitive, depression L. Referrals and appointments   In addition, I have reviewed and discussed with patient certain preventive protocols, quality metrics, and  best practice recommendations. A written personalized care plan for preventive services and recommendations were provided to patient.  With patient's permission, we connected on 11/28/18 at  2:30 PM EDT by a video enabled telemedicine application. Two patient identifiers were used to ensure the encounter occurred with the correct person. .   Patient was in home and writer was in office.   Signed,   Lindell Noe, MHA, BS, LPN Health Coach

## 2018-11-28 NOTE — Patient Instructions (Signed)
[name] , Thank you for taking time to come for your Medicare Wellness Visit. I appreciate your ongoing commitment to your health goals. Please review the following plan we discussed and let me know if I can assist you in the future.   These are the goals we discussed: Goals    . Patient Stated     Starting 11/28/2018, I will continue to take medications as prescribed.        This is a list of the screening recommended for you and due dates:  Health Maintenance  Topic Date Due  .  Hepatitis C: One time screening is recommended by Center for Disease Control  (CDC) for  adults born from [birth year] through 1965.   02-23-1951  . Tetanus Vaccine  08/13/2008  . Complete foot exam   10/21/2015  . Pneumonia vaccines (1 of 2 - PCV13) 07/04/2016  . Flu Shot  03/14/2019  . Hemoglobin A1C  05/02/2019  . Eye exam for diabetics  05/23/2019  . Colon Cancer Screening  06/11/2020   Preventive Care for Adults  A healthy lifestyle and preventive care can promote health and wellness. Preventive health guidelines for adults include the following key practices.  . A routine yearly physical is a good way to check with your health care provider about your health and preventive screening. It is a chance to share any concerns and updates on your health and to receive a thorough exam.  . Visit your dentist for a routine exam and preventive care every 6 months. Brush your teeth twice a day and floss once a day. Good oral hygiene prevents tooth decay and gum disease.  . The frequency of eye exams is based on your age, health, family medical history, use  of contact lenses, and other factors. Follow your health care provider's recommendations for frequency of eye exams.  . Eat a healthy diet. Foods like vegetables, fruits, whole grains, low-fat dairy products, and lean protein foods contain the nutrients you need without too many calories. Decrease your intake of foods high in solid fats, added sugars, and salt.  Eat the right amount of calories for you. Get information about a proper diet from your health care provider, if necessary.  . Regular physical exercise is one of the most important things you can do for your health. Most adults should get at least 150 minutes of moderate-intensity exercise (any activity that increases your heart rate and causes you to sweat) each week. In addition, most adults need muscle-strengthening exercises on 2 or more days a week.  Silver Sneakers may be a benefit available to you. To determine eligibility, you may visit the website: www.silversneakers.com or contact program at 316-267-9750 Mon-Fri between 8AM-8PM.   . Maintain a healthy weight. The body mass index (BMI) is a screening tool to identify possible weight problems. It provides an estimate of body fat based on height and weight. Your health care provider can find your BMI and can help you achieve or maintain a healthy weight.   For adults 20 years and older: ? A BMI below 18.5 is considered underweight. ? A BMI of 18.5 to 24.9 is normal. ? A BMI of 25 to 29.9 is considered overweight. ? A BMI of 30 and above is considered obese.   . Maintain normal blood lipids and cholesterol levels by exercising and minimizing your intake of saturated fat. Eat a balanced diet with plenty of fruit and vegetables. Blood tests for lipids and cholesterol should begin at age  20 and be repeated every 5 years. If your lipid or cholesterol levels are high, you are over 50, or you are at high risk for heart disease, you may need your cholesterol levels checked more frequently. Ongoing high lipid and cholesterol levels should be treated with medicines if diet and exercise are not working.  . If you smoke, find out from your health care provider how to quit. If you do not use tobacco, please do not start.  . If you choose to drink alcohol, please do not consume more than 2 drinks per day. One drink is considered to be 12 ounces (355  mL) of beer, 5 ounces (148 mL) of wine, or 1.5 ounces (44 mL) of liquor.  . If you are 5-24 years old, ask your health care provider if you should take aspirin to prevent strokes.  . Use sunscreen. Apply sunscreen liberally and repeatedly throughout the day. You should seek shade when your shadow is shorter than you. Protect yourself by wearing long sleeves, pants, a wide-brimmed hat, and sunglasses year round, whenever you are outdoors.  . Once a month, do a whole body skin exam, using a mirror to look at the skin on your back. Tell your health care provider of new moles, moles that have irregular borders, moles that are larger than a pencil eraser, or moles that have changed in shape or color.

## 2018-11-28 NOTE — Progress Notes (Signed)
PCP notes:   Health maintenance:  Hep C screening - PCP follow-up requested Foot exam - PCP follow-up requested PNA vaccine - PCP follow-up requested  Tetanus vaccine - postponed/insurance  Abnormal screenings:   None  Patient concerns:   None  Nurse concerns:  None  Next PCP appt:   12/04/18 @ 1200  I reviewed health advisor's note, was available for consultation on the day of service listed in this note, and agree with documentation and plan. Elsie Stain, MD.

## 2018-12-01 ENCOUNTER — Telehealth: Payer: Self-pay | Admitting: *Deleted

## 2018-12-01 ENCOUNTER — Telehealth: Payer: Self-pay

## 2018-12-01 ENCOUNTER — Other Ambulatory Visit: Payer: Medicare Other

## 2018-12-01 NOTE — Telephone Encounter (Signed)
Pt requiring PA for Repatha inj. PA has been submitted through [place]. Awaiting Response can take up to 24-72 hours for approval response.

## 2018-12-01 NOTE — Telephone Encounter (Signed)
Virtual Visit Pre-Appointment Phone Call  Steps For Call:  1. Confirm consent - "In the setting of the current Covid19 crisis, you are scheduled for a phone visit with your provider on Dec 18, 2018 at 9:15AM.  Just as we do with many in-office visits, in order for you to participate in this visit, we must obtain consent.  If you'd like, I can send this to your mychart (if signed up) or email for you to review.  Otherwise, I can obtain your verbal consent now.  All virtual visits are billed to your insurance company just like a normal visit would be.  By agreeing to a virtual visit, we'd like you to understand that the technology does not allow for your provider to perform an examination, and thus may limit your provider's ability to fully assess your condition. If your provider identifies any concerns that need to be evaluated in person, we will make arrangements to do so.  Finally, though the technology is pretty good, we cannot assure that it will always work on either your or our end, and in the setting of a video visit, we may have to convert it to a phone-only visit.  In either situation, we cannot ensure that we have a secure connection.  Are you willing to proceed?" STAFF: Did the patient verbally acknowledge consent to telehealth visit? Document YES/NO here: YES  2. Confirm the BEST phone number to call the day of the visit by including in appointment notes  3. Give patient instructions for WebEx/MyChart download to smartphone as below or [email or web address] if video visit (depending on what platform provider is using)  4. Advise patient to be prepared with their blood pressure, heart rate, weight, any heart rhythm information, their current medicines, and a piece of paper and pen handy for any instructions they may receive the day of their visit  5. Inform patient they will receive a phone call 15 minutes prior to their appointment time (may be from unknown caller ID) so they should be  prepared to answer  6. Confirm that appointment type is correct in Epic appointment notes (VIDEO vs PHONE)     TELEPHONE CALL NOTE  Phillip Clements has been deemed a candidate for a follow-up tele-health visit to limit community exposure during the Covid-19 pandemic. I spoke with the patient via phone to ensure availability of phone/video source, confirm preferred email & phone number, and discuss instructions and expectations.  I reminded Phillip Clements to be prepared with any vital sign and/or heart rhythm information that could potentially be obtained via home monitoring, at the time of his visit. I reminded Phillip Clements to expect a phone call at the time of his visit if his visit.  Phillip Clements 12/01/2018 2:03 PM    FULL LENGTH CONSENT FOR TELE-HEALTH VISIT   I hereby voluntarily request, consent and authorize [facility] and its employed or contracted physicians, physician assistants, nurse practitioners or other licensed health care professionals (the Practitioner), to provide me with telemedicine health care services (the "Services") as deemed necessary by the treating Practitioner. I acknowledge and consent to receive the Services by the Practitioner via telemedicine. I understand that the telemedicine visit will involve communicating with the Practitioner through live audiovisual communication technology and the disclosure of certain medical information by electronic transmission. I acknowledge that I have been given the opportunity to request an in-person assessment or other available alternative prior to the telemedicine visit and am voluntarily participating  in the telemedicine visit.  I understand that I have the right to withhold or withdraw my consent to the use of telemedicine in the course of my care at any time, without affecting my right to future care or treatment, and that the Practitioner or I may terminate the telemedicine visit at any time. I understand  that I have the right to inspect all information obtained and/or recorded in the course of the telemedicine visit and may receive copies of available information for a reasonable fee.  I understand that some of the potential risks of receiving the Services via telemedicine include:  Marland Kitchen Delay or interruption in medical evaluation due to technological equipment failure or disruption; . Information transmitted may not be sufficient (e.g. poor resolution of images) to allow for appropriate medical decision making by the Practitioner; and/or  . In rare instances, security protocols could fail, causing a breach of personal health information.  Furthermore, I acknowledge that it is my responsibility to provide information about my medical history, conditions and care that is complete and accurate to the best of my ability. I acknowledge that Practitioner's advice, recommendations, and/or decision may be based on factors not within their control, such as incomplete or inaccurate data provided by me or distortions of diagnostic images or specimens that may result from electronic transmissions. I understand that the practice of medicine is not an exact science and that Practitioner makes no warranties or guarantees regarding treatment outcomes. I acknowledge that I will receive a copy of this consent concurrently upon execution via email to the email address I last provided but may also request a printed copy by calling the office of [facility].    I understand that my insurance will be billed for this visit.   I have read or had this consent read to me. . I understand the contents of this consent, which adequately explains the benefits and risks of the Services being provided via telemedicine.  . I have been provided ample opportunity to ask questions regarding this consent and the Services and have had my questions answered to my satisfaction. . I give my informed consent for the services to be provided  through the use of telemedicine in my medical care  By participating in this telemedicine visit I agree to the above.

## 2018-12-04 ENCOUNTER — Ambulatory Visit (INDEPENDENT_AMBULATORY_CARE_PROVIDER_SITE_OTHER): Payer: Medicare Other | Admitting: Family Medicine

## 2018-12-04 ENCOUNTER — Encounter: Payer: Self-pay | Admitting: Family Medicine

## 2018-12-04 ENCOUNTER — Encounter: Payer: Medicare Other | Admitting: Family Medicine

## 2018-12-04 DIAGNOSIS — E785 Hyperlipidemia, unspecified: Secondary | ICD-10-CM | POA: Diagnosis not present

## 2018-12-04 DIAGNOSIS — E119 Type 2 diabetes mellitus without complications: Secondary | ICD-10-CM

## 2018-12-04 DIAGNOSIS — C61 Malignant neoplasm of prostate: Secondary | ICD-10-CM | POA: Diagnosis not present

## 2018-12-04 DIAGNOSIS — I2510 Atherosclerotic heart disease of native coronary artery without angina pectoris: Secondary | ICD-10-CM | POA: Diagnosis not present

## 2018-12-04 DIAGNOSIS — Z7189 Other specified counseling: Secondary | ICD-10-CM

## 2018-12-04 DIAGNOSIS — I10 Essential (primary) hypertension: Secondary | ICD-10-CM

## 2018-12-04 DIAGNOSIS — Z0000 Encounter for general adult medical examination without abnormal findings: Secondary | ICD-10-CM

## 2018-12-04 MED ORDER — INSULIN DEGLUDEC 200 UNIT/ML ~~LOC~~ SOPN: 28.0000 [IU] | PEN_INJECTOR | Freq: Every day | SUBCUTANEOUS | 5 refills | Status: DC

## 2018-12-04 NOTE — Progress Notes (Signed)
Virtual visit completed through WebEx or similar program Patient location: home  Provider location: [facility] at [facility], office   Limitations and rationale for visit method d/w patient.  Patient agreed to proceed.   CC: Follow-up.  HPI:  PSA per urology, d/w pt.  I'll defer. He agrees.    DM2 per endo.  I'll defer, he agrees.    [facility] - [facility] so he would have been checked at that point.  Foot exam - deferred.   PNA vaccine - deferred for now given pandemic.   Tetanus vaccine - deferred for now given pandemic.   Colonoscopy 2018 Advance directive d/w pt.  Wife designated if patient were incapacitated.  If patient has a treatable issue, he would want full scope of care. If he no chance of recovery, then would want comfort care.    Elevated Cholesterol: Using medications without problems: yes Muscle aches: no Diet compliance: yes Exercise: yes He has lost weight in the last year and his fatigue is better.   Statin intolerant.  Hypertension:    Using medication without problems or lightheadedness: no Chest pain with exertion:no Edema:no Short of breath:no  Meds and allergies reviewed.   PMH and SH reviewed  ROS: Per HPI unless specifically indicated in ROS section   NAD Speech wnl  A/P:  [facility] C screening - [facility] so he would have been checked at that point.  Foot exam - deferred.   PNA vaccine - deferred for now given pandemic.   Tetanus vaccine - deferred for now given pandemic.   Colonoscopy 2018 Advance directive d/w pt.  Wife designated if patient were incapacitated.  If patient has a treatable issue, he would want full scope of care. If he no chance of recovery, then would want comfort care.    PSA per urology, d/w pt.  I'll defer. He agrees.    DM2 per endo.  I'll defer, he agrees.    Hypertension.  No change in meds.  Labs discussed with patient.  Discussed diet and  exercise.  Hyperlipidemia.No change in meds.  Labs discussed with patient.  Discussed diet and exercise.  Lipids are reasonable. I will update cardiology in the meantime as FYI.

## 2018-12-07 ENCOUNTER — Encounter: Payer: Self-pay | Admitting: Family Medicine

## 2018-12-07 DIAGNOSIS — E119 Type 2 diabetes mellitus without complications: Secondary | ICD-10-CM

## 2018-12-07 DIAGNOSIS — Z7189 Other specified counseling: Secondary | ICD-10-CM | POA: Insufficient documentation

## 2018-12-07 DIAGNOSIS — Z0000 Encounter for general adult medical examination without abnormal findings: Secondary | ICD-10-CM | POA: Insufficient documentation

## 2018-12-07 NOTE — Assessment & Plan Note (Signed)
[facility] C screening - [facility] so he would have been checked at that point.  Foot exam - deferred.   PNA vaccine - deferred for now given pandemic.   Tetanus vaccine - deferred for now given pandemic.   Colonoscopy 2018 Advance directive d/w pt.  Wife designated if patient were incapacitated.  If patient has a treatable issue, he would want full scope of care. If he no chance of recovery, then would want comfort care.    PSA per urology, d/w pt.  I'll defer. He agrees.

## 2018-12-07 NOTE — Assessment & Plan Note (Signed)
DM2 per endo.  I'll defer, he agrees.

## 2018-12-07 NOTE — Assessment & Plan Note (Signed)
No change in meds.  Labs discussed with patient.  Discussed diet and exercise.  Lipids are reasonable. I will update cardiology in the meantime as FYI.

## 2018-12-07 NOTE — Assessment & Plan Note (Signed)
Advance directive d/w pt.  Wife designated if patient were incapacitated.  If patient has a treatable issue, he would want full scope of care. If he no chance of recovery, then would want comfort care.

## 2018-12-07 NOTE — Assessment & Plan Note (Signed)
PSA per urology, d/w pt.  I'll defer. He agrees.

## 2018-12-18 ENCOUNTER — Telehealth (INDEPENDENT_AMBULATORY_CARE_PROVIDER_SITE_OTHER): Payer: Medicare Other | Admitting: Nurse Practitioner

## 2018-12-18 ENCOUNTER — Encounter: Payer: Self-pay | Admitting: Nurse Practitioner

## 2018-12-18 ENCOUNTER — Other Ambulatory Visit: Payer: Self-pay

## 2018-12-18 VITALS — Ht 69.0 in | Wt 226.1 lb

## 2018-12-18 DIAGNOSIS — I2510 Atherosclerotic heart disease of native coronary artery without angina pectoris: Secondary | ICD-10-CM

## 2018-12-18 DIAGNOSIS — I10 Essential (primary) hypertension: Secondary | ICD-10-CM

## 2018-12-18 DIAGNOSIS — I739 Peripheral vascular disease, unspecified: Secondary | ICD-10-CM

## 2018-12-18 DIAGNOSIS — E785 Hyperlipidemia, unspecified: Secondary | ICD-10-CM

## 2018-12-18 DIAGNOSIS — Z794 Long term (current) use of insulin: Secondary | ICD-10-CM

## 2018-12-18 DIAGNOSIS — E119 Type 2 diabetes mellitus without complications: Secondary | ICD-10-CM

## 2018-12-18 NOTE — Progress Notes (Signed)
Virtual Visit via Telephone Note   This visit type was conducted due to national recommendations for restrictions regarding the COVID-19 Pandemic (e.g. social distancing) in an effort to limit this patient's exposure and mitigate transmission in our community.  Due to his co-morbid illnesses, this patient is at least at moderate risk for complications without adequate follow up.  This format is felt to be most appropriate for this patient at this time.  The patient did not have access to video technology/had technical difficulties with video requiring transitioning to audio format only (telephone).  All issues noted in this document were discussed and addressed.  No physical exam could be performed with this format.  Please refer to the patient's chart for his  consent to telehealth for Phillip Clements. Evaluation Performed:  Follow-up visit  This visit type was conducted due to national recommendations for restrictions regarding the COVID-19 Pandemic (e.g. social distancing).  This format is felt to be most appropriate for this patient at this time.  All issues noted in this document were discussed and addressed.  No physical exam was performed (except for noted visual exam findings with Video Visits).  Please refer to the patient's chart (Phillip Clements message for video visits and phone note for telephone visits) for the patient's consent to telehealth for Phillip Clements. _____________   Date:  12/18/2018   Patient ID:  Phillip Clements, DOB 01-20-1951, MRN [id number] Patient Location:  Home  Provider location:   office  Primary Care Provider:  Tonia Ghent, MD Primary Cardiologist:  Phillip Sacramento, MD  Chief Complaint    68 year old male with a history of CAD status post CABG, PAD status post left SFA angioplasty, hypertension, hyperlipidemia, stroke, diabetes, obesity, Mitral regurgitation, and sleep apnea, who presents for virtual follow-up for the above.  Past Medical History    Past  Medical History:  Diagnosis Date   Cataract    bilateral eye in 2010   Coronary artery disease    a. CABG 04/2014: (LIMA->LAD, VG->DIAG, VG->OM2, VG->PDA)  b. 07/2014 early graft failure (VG->OM2 60, LIMA->LAD atretic, VG->PDA & VG->D1 100). s/p successful rotational atherectomy & DES of the LM & pLAD (3.0x24 Promus DES) & mLAD (2.25x24 Promus DES).   CVA (cerebral infarction)    Hx of adenomatous colonic polyps 06/17/2017   Hyperlipidemia    a. Statin intolerant.   Hypertension 2000   Moderate mitral regurgitation    a. 02/2015 Echo: EF 55-60%, no rwma, mod MR, mod BAE, mildly dil RV w/ low nl RV fxn.   OSA on CPAP    Peripheral vascular disease (Phillip Clements)    a. left SFA angioplasty in December 2016   Prostate cancer Phillip Clements)    Sleep apnea    wears CPAP nightly   Type II diabetes mellitus Phillip Clements)    Past Surgical History:  Procedure Laterality Date   ANTERIOR CERVICAL DECOMP/DISCECTOMY FUSION  08/25/2012   Procedure: ANTERIOR CERVICAL DECOMPRESSION/DISCECTOMY FUSION 2 LEVELS;  Surgeon: Phillip Spangle, MD;  Location: Phillip [facility]ments;  Service: Neurosurgery;  Laterality: N/A;  Cervical five-six Cervical six-seven anterior cervical decompression with fusion and plating and bonegraft   BACK SURGERY     CARDIAC CATHETERIZATION  04/2014; 07/19/2014   CORONARY ARTERY BYPASS GRAFT N/A 04/26/2014   Procedure: CORONARY ARTERY BYPASS GRAFTING (CABG), on pump, times four, using left internal mammary artery, right greater saphenous vein harvested endoscopically.;  Surgeon: Phillip Poot, MD;  Location: [facility];  Service: Open Heart Surgery;  Laterality: N/A;  LIMA to LAD, SVG to DIAGONAL, SVG to OM1, SVG to PDA) with EVH of the RIGHT THIGH and LOWER EXTREMITY SAPHENOUS VEIN   Cytoscopy prostatic stone o/w nml  06/21/08   Dr. Jacqlyn Clements   ETT myoview  09/13/09   Low risk EF 49%   High intense focused ultrasound  07/20/06   By Dr. Jacqlyn Clements   INTRAOPERATIVE TRANSESOPHAGEAL ECHOCARDIOGRAM N/A 04/26/2014    Procedure: INTRAOPERATIVE TRANSESOPHAGEAL ECHOCARDIOGRAM;  Surgeon: Phillip Poot, MD;  Location: Phillip [facility];  Service: Open Heart Surgery;  Laterality: N/A;   PERCUTANEOUS CORONARY ROTOBLATOR INTERVENTION (PCI-R) N/A 07/22/2014   Procedure: PERCUTANEOUS CORONARY ROTOBLATOR INTERVENTION (PCI-R);  Surgeon: Phillip M Martinique, MD;  Location: [facility];  Service: Cardiovascular;  Laterality: N/A;   PERIPHERAL VASCULAR CATHETERIZATION Left 07/26/2015   Procedure: Lower Extremity Angiography;  Surgeon: Katha Cabal, MD;  Location: [facility];  Service: Cardiovascular;  Laterality: Left;   PERIPHERAL VASCULAR CATHETERIZATION  07/26/2015   Procedure: Lower Extremity Intervention;  Surgeon: Katha Cabal, MD;  Location: [facility];  Service: Cardiovascular;;   PROSTATE BIOPSY  04/03/06 & 02/25/07    Allergies  Allergies  Allergen Reactions   Coreg [Carvedilol] Other (See Comments)    Fatigue- unclear if this was due to coreg specifically or beta blockers in general.     Metformin And Related Nausea Only   Protonix [Pantoprazole Sodium] Diarrhea   Rosuvastatin Calcium     REACTION: JOINT ACHES   Statins     REACTION: JOINT ACHES    History of Present Illness    Phillip Clements is a 68 y.o. male who presents via audio/video conferencing for a telehealth visit today.  Patient does not have access to a video source and therefore this is carried out as a phone visit.  As above, he has a history of CAD status post CABG x4 in September 2015.  He had recurrent chest discomfort in November 2015 and had an abnormal Myoview.  Repeat catheterization showed a 6% stenosis in the vein graft to the obtuse marginal with occluded vein graft to the diagonal and PDA as well as an atretic LIMA to the LAD.  He was transferred to [facility] for evaluation for repeat CABG versus PCI and subsequently underwent atherectomy and drug-eluting stent placement x2 to the left main and proximal/mid LAD.   Other history includes peripheral arterial disease status post PTA of the left SFA in December 2016, hypertension, hyperlipidemia, type 2 diabetes mellitus, obesity, sleep apnea, prostate cancer, and remote tobacco abuse.  Phillip Clements was last seen in clinic in February 2019.  Since then, he reports that he has done well.  He has been walking 30 minutes and or playing golf most days of the week.  He does not experience chest pain but does note dyspnea on exertion at higher levels of activity or when walking up hills.  He also notes mild bilateral calf discomfort after prolonged periods of walking, similar to prior claudication.  He says the symptoms have actually improved over time and he can often walk up to an hour at a time without experiencing any symptoms.  He is not currently interested in any repeat imaging.  He denies palpitations, PND, orthopnea, dizziness, syncope, edema, or early satiety.  The patient does not have symptoms concerning for COVID-19 infection (fever, chills, cough, or new shortness of breath).   Home Medications    Prior to Admission medications   Medication Sig Start Date End Date Taking?  Authorizing Provider  aspirin EC 81 MG tablet Take 1 tablet (81 mg total) by mouth daily. 11/01/14   Wellington Hampshire, MD  B Complex Vitamins (VITAMIN B COMPLEX PO) Take by mouth.    [provider]  B-D ULTRAFINE III SHORT PEN 31G X 8 MM MISC USE AS DIRECTED TWICE DAILY 03/11/15   Phillip Ghent, MD  bicalutamide (CASODEX) 50 MG tablet Take 1 tablet (50 mg total) by mouth daily. 08/15/18   Stoioff, Ronda Fairly, MD  carvedilol (COREG) 3.125 MG tablet TAKE 1 TABLET BY MOUTH TWICE A DAY 11/28/18   Wellington Hampshire, MD  clopidogrel (PLAVIX) 75 MG tablet TAKE 1 TABLET BY MOUTH ONCE A DAY 08/18/18   Wellington Hampshire, MD  Dulaglutide (TRULICITY) 1.5 GE/3.6OQ SOPN Inject 1.5 mg into the skin once a week. 07/24/18   Philemon Kingdom, MD  ezetimibe (ZETIA) 10 MG tablet TAKE 1 TABLET BY MOUTH  ONCE DAILY 07/17/18   Wellington Hampshire, MD  glucose blood (ONE TOUCH ULTRA TEST) test strip Check blood sugar twice a day and as directed Dx E11.59 02/20/17   Phillip Ghent, MD  Injection Device ([place]man) MISC Frequency:PHARMDIR   Dosage:0.0     Instructions:  Note:diagnosis 250.02 Dose: NA 09/28/08   [provider]  Insulin Degludec (TRESIBA FLEXTOUCH) 200 UNIT/ML SOPN Inject 28 Units into the skin daily. 12/04/18   Phillip Ghent, MD  INVOKANA 300 MG TABS tablet TAKE 1 TABLET BY MOUTH ONCE DAILY WITH BREAKFAST 03/19/18   Philemon Kingdom, MD  lansoprazole (PREVACID) 15 MG capsule Take 15 mg by mouth daily at 12 noon.    [provider]  leuprolide (LUPRON) 30 MG injection Inject 45 mg into the muscle every 6 (six) months.    [provider]  losartan (COZAAR) 25 MG tablet TAKE 1/2 TABLET BY MOUTH ONCE A DAY 07/01/18   Wellington Hampshire, MD  NITROSTAT 0.4 MG SL tablet Place 1 tablet (0.4 mg total) under the tongue as needed. 01/19/16   Wellington Hampshire, MD  REPATHA SURECLICK 947 MG/ML SOAJ INHECT 1 PEN INTO THE SKIN EVERY 14 DAYS 11/05/18   Wellington Hampshire, MD  UNIFINE PENTIPS 29G X 12MM MISC USE AS DIRECTED TWICE DAILY 05/17/16   Phillip Ghent, MD    Review of Systems    Mild bilateral calf claudication after prolonged periods of walking such as up to 1 hour.  Mild dyspnea on exertion with inclines.  Otherwise denies chest pain, palpitations, PND, orthopnea, dizziness, syncope, edema, or early satiety.  All other systems reviewed and are otherwise negative except as noted above.  Physical Exam    Vital Signs:  Ht 5\' 9"  (1.753 m)    Wt 226 lb 2 oz (102.6 kg)    BMI 33.39 kg/m    Exam limited by phone visit.  Awake alert and oriented x3.  No acute distress.  Respirations regular and unlabored.  Accessory Clinical Findings    Clements Results  Component Value Date   CREATININE 0.97 11/28/2018   BUN 21 11/28/2018   NA 139 11/28/2018   K 4.3  11/28/2018   CL 104 11/28/2018   CO2 28 11/28/2018    Clements Results  Component Value Date   HGBA1C 7.8 (A) 10/30/2018    Clements Results  Component Value Date   CHOL 156 11/28/2018   HDL 40.60 11/28/2018   LDLCALC 92 11/28/2018   LDLDIRECT 157.1 09/07/2013   TRIG 119.0 11/28/2018  CHOLHDL 4 11/28/2018    Assessment & Plan    1.  Coronary artery disease: Status post CABG x4 in September 2015 with subsequent finding of occlusions to the vein grafts to the diagonal and PDA with an atretic LIMA to the LAD.  The vein graft to the obtuse marginal had a 6% stenosis.  He had severe multivessel native coronary artery disease and underwent atherectomy with drug-eluting stent placement x2 to the left main and proximal/mid LAD.  He has done well since then and over the past year has remained active without chest pain or significant limitations in activities.  He is walking regularly and was also playing golf prior to COVID-19 restrictions.  He remains on aspirin, Plavix, Zetia, Repatha, beta-blocker, and ARB therapy.  2.  Essential hypertension: He does not have a cuff at home but blood pressure trends from recent visits were reviewed.  He typically runs in the 120s to low 130s.  No changes to meds today.  3.  Hyperlipidemia: Statin intolerant.  He is on Repatha once every 2 weeks and also Zetia.  He notes that he sometimes forgets to take his Repatha and this is evidenced by his rise in LDL to 92 evaluation, up from last fall.  He now lists his doses on his calendar and says he has been more compliant.  4.  Type 2 diabetes mellitus: Followed closely by endocrinology with his most recent A1c of 7.8.  He says he is paying more attention to his diet and is hopeful to get this back down, as he was 5.6 last September.  5.  Morbid obesity: He has lost a significant amount of weight since his last office visit here, down 52 pounds since February 2019.  I congratulated him on this and encouraged him to remain  active.  6.  Obstructive sleep apnea: Compliant with CPAP.  7.  Peripheral arterial disease with bilateral lower extremity claudication: Status post prior left SFA angioplasty and previously followed by vascular surgery.  ABIs in November 2018 were stable.  He says that he is always had some degree of bilateral calf claudication and if anything, this has improved over time with a walking program.  He remains on aspirin, Plavix, and lipid management.  With stable symptoms, he is not interested in repeat ABIs at this time.    8.  Disposition: Follow-up in clinic with Dr. Fletcher Anon in 9 months or sooner if necessary.  COVID-19 Education: The signs and symptoms of COVID-19 were discussed with the patient and how to seek care for testing (follow up with PCP or arrange E-visit).  The importance of social distancing was discussed today.  He is doing his best with this however has not been wearing a mask in public frequently, as he does not tolerate it.  Patient Risk:   After full review of this patient's history and clinical status, I feel that he is at least moderate risk for cardiac complications at this time, thus necessitating a telehealth visit sooner than our first available in office visit.  Time:   Today, I have spent 25 minutes with the patient with telehealth technology discussing history, symptoms, and management.    Murray Hodgkins, NP 12/18/2018, 9:52 AM

## 2018-12-18 NOTE — Patient Instructions (Signed)
It was a pleasure to speak with you on the phone today! Thank you for allowing [place] to continue taking care of your [facility] needs during this time.   Feel free to call as needed for questions and concerns related to your cardiac needs.   Medication Instructions:  Your physician recommends that you continue on your current medications as directed. Please refer to the Current Medication list given to you today.  If you need a refill on your cardiac medications before your next appointment, please call your pharmacy.   Lab work: None ordered  If you have labs (blood work) drawn today and your tests are completely normal, you will receive your results only by: Marland Kitchen MyChart Message (if you have MyChart) OR . A paper copy in the mail If you have any lab test that is abnormal or we need to change your treatment, we will call you to review the results.  Testing/Procedures: None ordered   Follow-Up: At [facility], you and your health needs are our priority.  As part of our continuing mission to provide you with exceptional heart care, we have created designated Provider Care Teams.  These Care Teams include your primary Cardiologist (physician) and Advanced Practice Providers (APPs -  Physician Assistants and Nurse Practitioners) who all work together to provide you with the care you need, when you need it. You will need a follow up appointment in 9 months.  Please call our office 2 months in advance to schedule this appointment.  Please see [name], MD.

## 2018-12-20 ENCOUNTER — Other Ambulatory Visit: Payer: Self-pay | Admitting: Cardiovascular Disease

## 2018-12-20 DIAGNOSIS — R079 Chest pain, unspecified: Secondary | ICD-10-CM

## 2018-12-20 DIAGNOSIS — E785 Hyperlipidemia, unspecified: Secondary | ICD-10-CM

## 2018-12-20 DIAGNOSIS — I2510 Atherosclerotic heart disease of native coronary artery without angina pectoris: Secondary | ICD-10-CM

## 2018-12-20 DIAGNOSIS — I10 Essential (primary) hypertension: Secondary | ICD-10-CM

## 2018-12-30 ENCOUNTER — Other Ambulatory Visit: Payer: Self-pay | Admitting: Cardiovascular Disease

## 2018-12-30 DIAGNOSIS — I10 Essential (primary) hypertension: Secondary | ICD-10-CM

## 2018-12-30 DIAGNOSIS — I2510 Atherosclerotic heart disease of native coronary artery without angina pectoris: Secondary | ICD-10-CM

## 2018-12-30 DIAGNOSIS — R079 Chest pain, unspecified: Secondary | ICD-10-CM

## 2018-12-30 DIAGNOSIS — E785 Hyperlipidemia, unspecified: Secondary | ICD-10-CM

## 2018-12-30 NOTE — Telephone Encounter (Signed)
Please review for refill.  

## 2019-01-26 ENCOUNTER — Telehealth: Payer: Self-pay

## 2019-01-26 ENCOUNTER — Ambulatory Visit (INDEPENDENT_AMBULATORY_CARE_PROVIDER_SITE_OTHER): Payer: Medicare Other | Admitting: Family Medicine

## 2019-01-26 ENCOUNTER — Encounter: Payer: Self-pay | Admitting: Family Medicine

## 2019-01-26 ENCOUNTER — Other Ambulatory Visit: Payer: Self-pay

## 2019-01-26 DIAGNOSIS — I2510 Atherosclerotic heart disease of native coronary artery without angina pectoris: Secondary | ICD-10-CM | POA: Diagnosis not present

## 2019-01-26 DIAGNOSIS — H8112 Benign paroxysmal vertigo, left ear: Secondary | ICD-10-CM | POA: Insufficient documentation

## 2019-01-26 MED ORDER — MECLIZINE HCL 25 MG PO TABS: 12.5000 mg | ORAL_TABLET | Freq: Two times a day (BID) | ORAL | 0 refills | Status: AC | PRN

## 2019-01-26 NOTE — Progress Notes (Signed)
This visit was conducted in person.  BP 118/62 (BP Location: Left Arm, Patient Position: Sitting, Cuff Size: Large)   Pulse 73   Temp 97.8 F (36.6 C) (Temporal)   Ht 5\' 9"  (1.753 m)   Wt 233 lb 7 oz (105.9 kg)   SpO2 97%   BMI 34.47 kg/m   Orthostatic VS for the past 24 hrs (Last 3 readings):  BP- Lying BP- Standing at 0 minutes  01/26/19 1447 - 120/60  01/26/19 1445 136/64 -    CC: "I have positional vertigo" Subjective:    Patient ID: Phillip Clements, male    DOB: [birth year], 68 y.o.   MRN: [id number]  HPI: Phillip Clements is a 68 y.o. male presenting on 01/26/2019 for Dizziness (C/o dizziness and feels like the room is spinning. H/o vertigo. Worsened on 01/21/19. )   H/o BPV last episode 5-6 yrs ago, this feels similar, acutely worse in the past 1-2 weeks. Trigger was sudden head movements. Describes room spinning dizziness with loss of balance. Happens with sudden head movements, can be associated with nausea. Has tried epley maneuver at home with worsening symptoms on left. Current episode started 4d ago, lasts 1-1.5 hours, but vertigo episode can last a few minutes at a time. Associated with nausea, but no new tinnitus or hearing loss. No congestion, no recent URI.   He feels he's staying well hydrated with water and diet Dr Malachi Bonds.   Currently building planters in the front yard.   H/o CAD and CVA. H/o DM on insulin and invoakana, HTN, HLD, OSA on CPAP.  On aspirin, plavix, repatha, zetia.      Relevant past medical, surgical, family and social history reviewed and updated as indicated. Interim medical history since our last visit reviewed. Allergies and medications reviewed and updated. Outpatient Medications Prior to Visit  Medication Sig Dispense Refill  . aspirin EC 81 MG tablet Take 1 tablet (81 mg total) by mouth daily. 90 tablet 3  . B-D ULTRAFINE III SHORT PEN 31G X 8 MM MISC USE AS DIRECTED TWICE DAILY 100 each 11  . bicalutamide (CASODEX) 50 MG tablet Take  1 tablet (50 mg total) by mouth daily. 90 tablet 2  . carvedilol (COREG) 3.125 MG tablet Take 1 tablet (3.125 mg total) by mouth 2 (two) times a day. 60 tablet 8  . clopidogrel (PLAVIX) 75 MG tablet TAKE 1 TABLET BY MOUTH ONCE A DAY 30 tablet 11  . Dulaglutide (TRULICITY) 1.5 OV/5.6EP SOPN Inject 1.5 mg into the skin once a week. 12 pen 3  . ezetimibe (ZETIA) 10 MG tablet TAKE 1 TABLET BY MOUTH ONCE DAILY 30 tablet 5  . glucose blood (ONE TOUCH ULTRA TEST) test strip Check blood sugar twice a day and as directed Dx E11.59 200 each 3  . Injection Device (CORNWALL METAL PIPETTING) MISC Frequency:PHARMDIR   Dosage:0.0     Instructions:  Note:diagnosis 250.02 Dose: NA    . Insulin Degludec (TRESIBA FLEXTOUCH) 200 UNIT/ML SOPN Inject 28 Units into the skin daily. 3 pen 5  . INVOKANA 300 MG TABS tablet TAKE 1 TABLET BY MOUTH ONCE DAILY WITH BREAKFAST 30 tablet 11  . leuprolide (LUPRON) 30 MG injection Inject 45 mg into the muscle every 6 (six) months.    Marland Kitchen losartan (COZAAR) 25 MG tablet TAKE 1/2 TABLET BY MOUTH ONCE A DAY 15 tablet 5  . NITROSTAT 0.4 MG SL tablet Place 1 tablet (0.4 mg total) under the tongue as needed. 25 tablet  3  . REPATHA SURECLICK 244 MG/ML SOAJ INHECT 1 PEN INTO THE SKIN EVERY 14 DAYS 2 pen 11  . UNIFINE PENTIPS 29G X 12MM MISC USE AS DIRECTED TWICE DAILY 100 each 5   No facility-administered medications prior to visit.      Per HPI unless specifically indicated in ROS section below Review of Systems Objective:    BP 118/62 (BP Location: Left Arm, Patient Position: Sitting, Cuff Size: Large)   Pulse 73   Temp 97.8 F (36.6 C) (Temporal)   Ht 5\' 9"  (1.753 m)   Wt 233 lb 7 oz (105.9 kg)   SpO2 97%   BMI 34.47 kg/m   Wt Readings from Last 3 Encounters:  01/26/19 233 lb 7 oz (105.9 kg)  12/18/18 226 lb 2 oz (102.6 kg)  10/30/18 232 lb (105.2 kg)    Physical Exam Vitals signs and nursing note reviewed.  Constitutional:      Appearance: Normal appearance. He is not  ill-appearing.  HENT:     Head: Normocephalic and atraumatic.     Mouth/Throat:     Mouth: Mucous membranes are moist.     Pharynx: No posterior oropharyngeal erythema.  Eyes:     Extraocular Movements: Extraocular movements intact.     Pupils: Pupils are equal, round, and reactive to light.  Cardiovascular:     Rate and Rhythm: Normal rate and regular rhythm.     Pulses: Normal pulses.     Heart sounds: Normal heart sounds. No murmur.  Pulmonary:     Effort: Pulmonary effort is normal. No respiratory distress.     Breath sounds: Normal breath sounds. No wheezing, rhonchi or rales.  Neurological:     General: No focal deficit present.     Mental Status: He is alert.     Cranial Nerves: Cranial nerves are intact.     Sensory: Sensation is intact.     Motor: Motor function is intact.     Coordination: Coordination is intact. Romberg sign negative.     Comments:  CN 2-12 intact  FTN intact EOMI  Dix hallpike ++ to left (vertical and torsional nystagmus that attenuates with fixation) Epley maneuver performed in office Unsteady stance       Assessment & Plan:   Problem List Items Addressed This Visit    BPV (benign positional vertigo), left    Anticipate benign positional peripheral vertigo given dix hallpike ad peripheral characteristics. Treated with epley canalith repositioning maneuvers for left side in office, handout provided to do at home. Rx meclizine PRN nausea/vertigo. Update if not improving with treatment. Pt agrees with plan.  Significant risk factors for stroke in h/o prior, however doubt central vertigo at this time.           Meds ordered this encounter  Medications  . meclizine (ANTIVERT) 25 MG tablet    Sig: Take 0.5-1 tablets (12.5-25 mg total) by mouth 2 (two) times daily as needed for dizziness or nausea.    Dispense:  30 tablet    Refill:  0   No orders of the defined types were placed in this encounter.   Follow up plan: Return if symptoms  worsen or fail to improve.  Ria Bush, MD

## 2019-01-26 NOTE — Assessment & Plan Note (Addendum)
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 Treated with epley canalith repositioning maneuvers for left side in office, handout provided to do at home. Rx meclizine PRN nausea/vertigo. Update if not improving with treatment. Pt agrees with plan.  Significant risk factors for stroke in h/o prior, however doubt central vertigo at this time.

## 2019-01-26 NOTE — Telephone Encounter (Signed)
Pt called with dizziness on and off since 12/22/18. Pt has been working outside and his back has been bothering him; pt has been sleeping on back and that usually makes pt dizzy. Pt said he has to get up during the night to urinate but last few nights very dizzy with room spinning when walks to bathroom to urinate. No CP,SOB,H/a, pt is not dizzy now and no blurred vision. No fever,S/T,chills,muscle pain,diarrhea,no loss of taste/smell; no travel and no known exposure to covid or flu. Pt has had cough for years. On 12/22/18 BP at home 114/78 and 12/25/18 BP at home 136/69; Dr Damita Dunnings said if room spinning pt should be seen today. Pt has had one virtual visit and wants in office visit. Pt to see Dr Darnell Level today at 2:45. ED precautions given.

## 2019-01-26 NOTE — Patient Instructions (Signed)
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 I think you have issue with inner ear on the left Do epleys provided today.  May try meclizine 1/2-1 tablet as needed  Let us know if not improving with treatment.  Benign Positional Vertigo Vertigo is the feeling that you or your surroundings are moving when they are not. Benign positional vertigo is the most common form of vertigo. This is usually a harmless condition (benign). This condition is positional. This means that symptoms are triggered by certain movements and positions. This condition can be dangerous if it occurs while you are doing something that could cause harm to you or others. This includes activities such as driving or operating machinery. What are the causes? In many cases, the cause of this condition is not known. It may be caused by a disturbance in an area of the inner ear that helps your brain to sense movement and balance. This disturbance can be caused by:  Viral infection (labyrinthitis).  Head injury.  Repetitive motion, such as jumping, dancing, or running. What increases the risk? You are more likely to develop this condition if:  You are a woman.  You are 68 years of age or older. What are the signs or symptoms? Symptoms of this condition usually happen when you move your head or your eyes in different directions. Symptoms may start suddenly, and usually last for less than a minute. They include:  Loss of balance and falling.  Feeling like you are spinning or moving.  Feeling like your surroundings are spinning or moving.  Nausea and vomiting.  Blurred vision.  Dizziness.  Involuntary eye movement (nystagmus). Symptoms can be mild and cause only minor problems, or they can be severe and interfere with daily life. Episodes of benign positional vertigo may return (recur) over time. Symptoms may improve over time. How is this diagnosed? This condition may be diagnosed based on:  Your  medical history.  Physical exam of the head, neck, and ears.  Tests, such as: ? MRI. ? CT scan. ? Eye movement tests. Your health care provider may ask you to change positions quickly while he or she watches you for symptoms of benign positional vertigo, such as nystagmus. Eye movement may be tested with a variety of exams that are designed to evaluate or stimulate vertigo. ? An electroencephalogram (EEG). This records electrical activity in your brain. ? Hearing tests. You may be referred to a health care provider who specializes in ear, nose, and throat (ENT) problems (otolaryngologist) or a provider who specializes in disorders of the nervous system (neurologist). How is this treated?  This condition may be treated in a session in which your health care provider moves your head in specific positions to adjust your inner ear back to normal. Treatment for this condition may take several sessions. Surgery may be needed in severe cases, but this is rare. In some cases, benign positional vertigo may resolve on its own in 2-4 weeks. Follow these instructions at home: Safety  Move slowly. Avoid sudden body or head movements or certain positions, as told by your health care provider.  Avoid driving until your health care provider says it is safe for you to do so.  Avoid operating heavy machinery until your health care provider says it is safe for you to do so.  Avoid doing any tasks that would be dangerous to you or others if vertigo occurs.  If you have trouble walking or keeping your balance, try using a  cane for stability. If you feel dizzy or unstable, sit down right away.  Return to your normal activities as told by your health care provider. Ask your health care provider what activities are safe for you. General instructions  Take over-the-counter and prescription medicines only as told by your health care provider.  Drink enough fluid to keep your urine pale yellow.  Keep all  follow-up visits as told by your health care provider. This is important. Contact a health care provider if:  You have a fever.  Your condition gets worse or you develop new symptoms.  Your family or friends notice any behavioral changes.  You have nausea or vomiting that gets worse.  You have numbness or a "pins and needles" sensation. Get help right away if you:  Have difficulty speaking or moving.  Are always dizzy.  Faint.  Develop severe headaches.  Have weakness in your legs or arms.  Have changes in your hearing or vision.  Develop a stiff neck.  Develop sensitivity to light. Summary  Vertigo is the feeling that you or your surroundings are moving when they are not. Benign positional vertigo is the most common form of vertigo.  The cause of this condition is not known. It may be caused by a disturbance in an area of the inner ear that helps your brain to sense movement and balance.  Symptoms include loss of balance and falling, feeling that you or your surroundings are moving, nausea and vomiting, and blurred vision.  This condition can be diagnosed based on symptoms, physical exam, and other tests, such as MRI, CT scan, eye movement tests, and hearing tests.  Follow safety instructions as told by your health care provider. You will also be told when to contact your health care provider in case of problems. This information is not intended to replace advice given to you by your health care provider. Make sure you discuss any questions you have with your health care provider. Document Released: 05/07/2006 Document Revised: 01/08/2018 Document Reviewed: 01/08/2018 Elsevier Interactive Patient Education  2019 [employer].

## 2019-01-28 ENCOUNTER — Other Ambulatory Visit: Payer: Self-pay

## 2019-01-28 DIAGNOSIS — C61 Malignant neoplasm of prostate: Secondary | ICD-10-CM

## 2019-01-30 ENCOUNTER — Other Ambulatory Visit: Payer: Self-pay

## 2019-01-30 ENCOUNTER — Other Ambulatory Visit: Payer: Medicare Other

## 2019-01-30 DIAGNOSIS — C61 Malignant neoplasm of prostate: Secondary | ICD-10-CM

## 2019-01-31 LAB — PSA: Prostate Specific Ag, Serum: 0.2 ng/mL (ref 0.0–4.0)

## 2019-02-02 ENCOUNTER — Other Ambulatory Visit: Payer: Self-pay | Admitting: Cardiovascular Disease

## 2019-02-03 ENCOUNTER — Encounter: Payer: Medicare Other | Admitting: Family Medicine

## 2019-02-05 ENCOUNTER — Telehealth: Payer: Self-pay | Admitting: Urology

## 2019-02-05 NOTE — Telephone Encounter (Signed)
App made and mailed to patient ° °Phillip Clements °

## 2019-02-05 NOTE — Telephone Encounter (Signed)
-----   Message from Abbie Sons, MD sent at 02/05/2019  9:00 AM EDT ----- PSA stable at 0.2.  Recommend follow-up visit with PSA 4 months

## 2019-02-06 ENCOUNTER — Other Ambulatory Visit: Payer: Medicare Other

## 2019-02-06 ENCOUNTER — Telehealth: Payer: Self-pay | Admitting: *Deleted

## 2019-02-06 NOTE — Telephone Encounter (Addendum)
Left message with results, asked to return call with any concerns.   ----- Message from Abbie Sons, MD sent at 02/05/2019  9:00 AM EDT ----- PSA stable at 0.2.  Recommend follow-up visit with PSA 4 months

## 2019-02-12 ENCOUNTER — Telehealth: Payer: Self-pay

## 2019-02-12 NOTE — Telephone Encounter (Signed)
Noted. Thanks.  Will await the results.  

## 2019-02-12 NOTE — Telephone Encounter (Signed)
Mrs Tall said that one of her coworkers who has fever & achy is being tested for covid virus;coworker on self quarantine since 02/11/19; hope to have results this weekend. Pt and his wife have no covid symptoms, have not traveled and no none exposure to + covid. Mrs Fei said that she and the coworker can be in meetings Mon - Fri from 30' to 2 hours per work day; no social distancing and no one in office wears mask. With pts med hx pts wife wonders if pt should be tested. Dr Damita Dunnings said request testing appt from [facility]. I sent message to [facility] requesting appt for covid testing; I also advised Mrs Hale pt should be self quarantined until get results of covid test. 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 She said she had already thought about that since one of the coworkers had to be tested.FYI to Dr Damita Dunnings.

## 2019-02-13 ENCOUNTER — Telehealth: Payer: Self-pay | Admitting: *Deleted

## 2019-02-13 ENCOUNTER — Telehealth: Payer: Self-pay

## 2019-02-13 DIAGNOSIS — Z20822 Contact with and (suspected) exposure to covid-19: Secondary | ICD-10-CM

## 2019-02-13 NOTE — Telephone Encounter (Signed)
Pt called and left message to return call to schedule COVID-19 testing. Order placed.

## 2019-02-13 NOTE — Telephone Encounter (Signed)
-----   Message from Phillip Shoe, LPN sent at 0/04/4075  4:09 PM EDT ----- Regarding: covid testing Dr Phillip Clements is requesting Phillip Clements be set up for covid testing. Pt does not have symptoms but has a medical history of heart issues, CVA, hypertension and diabetes. Pt may have been exposed to a positive covid individual.     Phillip Clements "Phillip Clements" Male, 68 y.o., 1950/11/29 MRN:  [id number]           1. E-MEDICARE/MEDICARE PART #    Elapsed Response History       Subscriber Demographics Phillip Clements, Phillip Clements Home: [phone]    Address linked to patient Work:               Coverage Info Member ID: [id number] Group:   Rel to subscriber: Self    Subscriber ID: [id number] Effective from: 06/13/2016 Auth phone:                            2. E-BLUE CROS#/BCBS SUPPLEMENT

## 2019-02-13 NOTE — Telephone Encounter (Signed)
Phone call to pt. To schedule for COVID testing.  Stated he does not feel he needs to be tested at this point.  Reported the individual that he thought he was exposed to, actually tested negative for COVID.  The pt. Stated he is feeling fine; denied any symptoms.  Stated he will call if he has development of symptoms.  Advised will make Dr. Damita Dunnings aware of his decision not to be tested.  Agreed with plan.

## 2019-02-13 NOTE — Telephone Encounter (Signed)
-----   Message from Helene Shoe, LPN sent at 03/17/4626  4:09 PM EDT ----- Regarding: covid testing Dr Damita Dunnings is requesting [name] be set up for covid testing. Pt does not have symptoms but has a medical history of heart issues, CVA, hypertension and diabetes. Pt may have been exposed to a positive covid individual.     Phillip Clements, 68 y.o., 08-26-1950 MRN:  [id number]           1. E-MEDICARE/MEDICARE PART #    Elapsed Response History       Subscriber Demographics Jong, [place] Home: [phone]    Address linked to patient Work:               Coverage Info Member ID: [id number] Group:   Rel to subscriber: Self    Subscriber ID: [id number] Effective from: 06/13/2016 Auth phone:                            2. E-BLUE CROS#/BCBS SUPPLEMENT

## 2019-02-16 ENCOUNTER — Telehealth: Payer: Self-pay | Admitting: Internal Medicine

## 2019-02-16 NOTE — Telephone Encounter (Signed)
Noted. Will cc PCP as fyi.

## 2019-02-16 NOTE — Telephone Encounter (Signed)
MEDICATION: one touch ultra 2 test strips  PHARMACY:  [facility] [phone]  IS THIS A 90 DAY SUPPLY : y  IS PATIENT OUT OF MEDICATION: n  IF NOT; HOW MUCH IS LEFT: 2  LAST APPOINTMENT DATE: @3 /19/2020  NEXT APPOINTMENT DATE:@7 /04/2019  DO WE HAVE YOUR PERMISSION TO LEAVE A DETAILED MESSAGE:  OTHER COMMENTS:    **Let patient know to contact pharmacy at the end of the day to make sure medication is ready. **  ** Please notify patient to allow 48-72 hours to process**  **Encourage patient to contact the pharmacy for refills or they can request refills through [facility]**

## 2019-02-18 ENCOUNTER — Other Ambulatory Visit: Payer: Self-pay

## 2019-02-18 MED ORDER — GLUCOSE BLOOD VI STRP: ORAL_STRIP | 3 refills | Status: DC

## 2019-02-18 NOTE — Addendum Note (Signed)
Addended by: Cardell Peach I on: 02/18/2019 10:05 AM   Modules accepted: Orders

## 2019-02-18 NOTE — Telephone Encounter (Signed)
RX sent

## 2019-02-19 ENCOUNTER — Encounter: Payer: Self-pay | Admitting: Internal Medicine

## 2019-02-19 ENCOUNTER — Ambulatory Visit (INDEPENDENT_AMBULATORY_CARE_PROVIDER_SITE_OTHER): Payer: Medicare Other | Admitting: Internal Medicine

## 2019-02-19 VITALS — BP 130/70 | HR 65 | Ht 69.0 in | Wt 233.0 lb

## 2019-02-19 DIAGNOSIS — I2510 Atherosclerotic heart disease of native coronary artery without angina pectoris: Secondary | ICD-10-CM

## 2019-02-19 DIAGNOSIS — Z6839 Body mass index (BMI) 39.0-39.9, adult: Secondary | ICD-10-CM | POA: Diagnosis not present

## 2019-02-19 DIAGNOSIS — E785 Hyperlipidemia, unspecified: Secondary | ICD-10-CM

## 2019-02-19 DIAGNOSIS — E1159 Type 2 diabetes mellitus with other circulatory complications: Secondary | ICD-10-CM

## 2019-02-19 DIAGNOSIS — E1165 Type 2 diabetes mellitus with hyperglycemia: Secondary | ICD-10-CM | POA: Diagnosis not present

## 2019-02-19 LAB — POCT GLYCOSYLATED HEMOGLOBIN (HGB A1C): Hemoglobin A1C: 7.2 % — AB (ref 4.0–5.6)

## 2019-02-19 MED ORDER — CANAGLIFLOZIN 300 MG PO TABS: ORAL_TABLET | ORAL | 3 refills | Status: DC

## 2019-02-19 MED ORDER — TRESIBA FLEXTOUCH 200 UNIT/ML ~~LOC~~ SOPN: 32.0000 [IU] | PEN_INJECTOR | Freq: Every day | SUBCUTANEOUS | 3 refills | Status: DC

## 2019-02-19 NOTE — Patient Instructions (Addendum)
Please continue: - Invokana 300 mg bfore b'fast - Trulicity1.5 mg weekly.  Please increase: - Tresiba to 32 units daily  Please return in 3-4 months with your sugar log.

## 2019-02-19 NOTE — Addendum Note (Signed)
Addended by: Cardell Peach I on: 02/19/2019 10:24 AM   Modules accepted: Orders

## 2019-02-19 NOTE — Progress Notes (Signed)
Patient ID: Phillip Clements, male   DOB: 09/08/1950, 68 y.o.   MRN: [id number]  HPI: Phillip Clements is a 68 y.o.-year-old male, returning for f/u for DM2, dx in 1996, insulin-dependent since 2008, uncontrolled, with complications (CAD - s/p stents, CABG, cerebro-vascular ds - s/p CVA, PVD). Last visit 4 months ago. He changed to [facility] in 10/2016.  Since last visit, sugars did improve after adding insulin, but they are still not at goal.  Last hemoglobin A1c was: Lab Results  Component Value Date   HGBA1C 7.8 (A) 10/30/2018   HGBA1C 7.9 (A) 07/24/2018   HGBA1C 5.6 04/15/2018   Pt was on a regimen of: - Invokana 300 mg before b'fast - Trulicity 1.5 mg weekly - Novolog 40 units 3x a day - 15 min before meals - Toujeo 120  units (60 x2) after dinner He tried Victoza and Januvia. Had GI intolerance (nausea, diarrhea) to regular Metformin and also with metformin ER >>stopped.  He was then on: - Invokana 300 mg before breakfast - Trulicity 1.5 mg weekly - U500 insulin 55-70 units 3 times a day before meals Also, if sugars in the morning are: - 160-200, take 10 units - 201-240, take 15 units - >240, take 20 units  He is currently on: - Invokana 300 mg before b'fast - Trulicity 1.5 mg weekly. - Tresiba 20 >> 28 units daily - started 10/2018  He is checking sugars once a day: - am: 132-239, 286 >> 120s, 178 >> 200 >> 126-170, 209, 214 - 2h after b'fast: n/c - not eating b'fast  - before lunch 150-212, 231 >> 100-120 >> n/c >> 126-166 - 2h after lunch: n/c - before dinner: 89, 163-288 >> 100-120 >> 160s >> 150 - 2h after dinner: n/c - bedtime: 201, 214 >> 128-150 >> n/c - nighttime: n/c Lowest sugar was 56 (on insulin) >> ... 110 >> 126; he has hypoglycemia awareness in the 70s. Highest sugar was  445 >> ... 200s >> 219.  Glucometer: OneTouch Ultra mini  Pt's meals are mostly plant-based: - Breakfast:  skips - Lunch: sandwich, chips, cottage cheese, pineapple - Dinner: 2  veggies, salads; Sat night: meat - Snacks: 2/day: celery + PB; low salt triscuit + laughing cow cheese  He continues to walk on the treadmill 5/7 days for 30 minutes and he lifts weights for 2/7 days.  -+ Mild CKD, last BUN/creatinine:  Lab Results  Component Value Date   BUN 21 11/28/2018   CREATININE 0.97 11/28/2018  On losartan. -+ HL; last set of lipids: Lab Results  Component Value Date   CHOL 156 11/28/2018   HDL 40.60 11/28/2018   LDLCALC 92 11/28/2018   LDLDIRECT 157.1 09/07/2013   TRIG 119.0 11/28/2018   CHOLHDL 4 11/28/2018  He has intolerance to statins due to joint pain.  He is on Repatha and Zetia. - last eye exam was in 05/2018: No DR; + history of cataract surgery. - no numbness and tingling in his feet.  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  He is on Lupron.  His PSA is detectable.  He is, understandably, very stressed about this.  ROS: Constitutional: no weight gain/no weight loss, no fatigue, no subjective hyperthermia, no subjective hypothermia Eyes: no blurry vision, no xerophthalmia ENT: no sore throat, no nodules palpated in neck, + dysphagia (recently found to have changes from GERD during EGD), no odynophagia, no hoarseness Cardiovascular: no CP/no SOB/no palpitations/no leg swelling Respiratory: no cough/no SOB/no wheezing Gastrointestinal:  no N/no V/no D/no C/no acid reflux Musculoskeletal: no muscle aches/no joint aches Skin: no rashes, no hair loss Neurological: no tremors/no numbness/no tingling/no dizziness  I reviewed pt's medications, allergies, PMH, social hx, family hx, and changes were documented in the history of present illness. Otherwise, unchanged from my initial visit note.  Past Medical History:  Diagnosis Date  . Cataract    bilateral eye in 2010  . Coronary artery disease    a. CABG 04/2014: (LIMA->LAD, VG->DIAG, VG->OM2, VG->PDA)  b. 07/2014 early graft failure (VG->OM2 60, LIMA->LAD atretic, VG->PDA & VG->D1 100). s/p  successful rotational atherectomy & DES of the LM & pLAD (3.0x24 Promus DES) & mLAD (2.25x24 Promus DES).  . CVA (cerebral infarction)   . Hx of adenomatous colonic polyps 06/17/2017  . Hyperlipidemia    a. Statin intolerant.  . Hypertension 2000  . Moderate mitral regurgitation    a. 02/2015 Echo: EF 55-60%, no rwma, mod MR, mod BAE, mildly dil RV w/ low nl RV fxn.  . OSA on CPAP   . Peripheral vascular disease ([place])    a. left SFA angioplasty in December 2016  . Prostate cancer ([place])   . Sleep apnea    wears CPAP nightly  . Type II diabetes mellitus ([place])    Past Surgical History:  Procedure Laterality Date  . ANTERIOR CERVICAL DECOMP/DISCECTOMY FUSION  08/25/2012   Procedure: ANTERIOR CERVICAL DECOMPRESSION/DISCECTOMY FUSION 2 LEVELS;  Surgeon: Hosie Spangle, MD;  Location: [place] [facility];  Service: Neurosurgery;  Laterality: N/A;  Cervical five-six Cervical six-seven anterior cervical decompression with fusion and plating and bonegraft  . BACK SURGERY    . CARDIAC CATHETERIZATION  04/2014; 07/19/2014  . CORONARY ARTERY BYPASS GRAFT N/A 04/26/2014   Procedure: CORONARY ARTERY BYPASS GRAFTING (CABG), on pump, times four, using left internal mammary artery, right greater saphenous vein harvested endoscopically.;  Surgeon: Ivin Poot, MD;  Location: [facility];  Service: Open Heart Surgery;  Laterality: N/A;  LIMA to LAD, SVG to DIAGONAL, SVG to OM1, SVG to PDA) with EVH of the RIGHT THIGH and LOWER EXTREMITY SAPHENOUS VEIN  . Cytoscopy prostatic stone o/w nml  06/21/08   Dr. Jacqlyn Larsen  . ETT myoview  09/13/09   Low risk EF 49%  . High intense focused ultrasound  07/20/06   By Dr. Jacqlyn Larsen  . INTRAOPERATIVE TRANSESOPHAGEAL ECHOCARDIOGRAM N/A 04/26/2014   Procedure: INTRAOPERATIVE TRANSESOPHAGEAL ECHOCARDIOGRAM;  Surgeon: Ivin Poot, MD;  Location: [place];  Service: Open Heart Surgery;  Laterality: N/A;  . PERCUTANEOUS CORONARY ROTOBLATOR INTERVENTION (PCI-R) N/A 07/22/2014   Procedure:  PERCUTANEOUS CORONARY ROTOBLATOR INTERVENTION (PCI-R);  Surgeon: Peter M Martinique, MD;  Location: [facility] CATH LAB;  Service: Cardiovascular;  Laterality: N/A;  . PERIPHERAL VASCULAR CATHETERIZATION Left 07/26/2015   Procedure: Lower Extremity Angiography;  Surgeon: Katha Cabal, MD;  Location: [place] CV LAB;  Service: Cardiovascular;  Laterality: Left;  . PERIPHERAL VASCULAR CATHETERIZATION  07/26/2015   Procedure: Lower Extremity Intervention;  Surgeon: Katha Cabal, MD;  Location: [place] CV LAB;  Service: Cardiovascular;;  . PROSTATE BIOPSY  04/03/06 & 02/25/07   Social History   Socioeconomic History  . Marital status: Married    Spouse name: Not on file  . Number of children: 3  . Years of education: Not on file  . Highest education level: Not on file  Occupational History  . Occupation: Insurance account manager  Social Needs  . Financial resource strain: Not on file  .  Food insecurity    Worry: Not on file    Inability: Not on file  . Transportation needs    Medical: Not on file    Non-medical: Not on file  Tobacco Use  . Smoking status: Former Smoker    Packs/day: 1.00    Years: 36.00    Pack years: 36.00    Types: Cigarettes    Quit date: 03/01/2014    Years since quitting: 4.9  . Smokeless tobacco: Never Used  Substance and Sexual Activity  . Alcohol use: Yes    Comment: 07/19/2014 "might have a drink a couple times/yr"  . Drug use: No  . Sexual activity: Not Currently  Lifestyle  . Physical activity    Days per week: Not on file    Minutes per session: Not on file  . Stress: Not on file  Relationships  . Social Herbalist on phone: Not on file    Gets together: Not on file    Attends religious service: Not on file    Active member of club or organization: Not on file    Attends meetings of clubs or organizations: Not on file    Relationship status: Not on file  . Intimate partner violence    Fear of current  or ex partner: Not on file    Emotionally abused: Not on file    Physically abused: Not on file    Forced sexual activity: Not on file  Other Topics Concern  . Not on file  Social History Narrative   Lives with wife.   2 daughters and 1 son.   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     Current Outpatient Medications on File Prior to Visit  Medication Sig Dispense Refill  . aspirin EC 81 MG tablet Take 1 tablet (81 mg total) by mouth daily. 90 tablet 3  . B-D ULTRAFINE III SHORT PEN 31G X 8 MM MISC USE AS DIRECTED TWICE DAILY 100 each 11  . bicalutamide (CASODEX) 50 MG tablet Take 1 tablet (50 mg total) by mouth daily. 90 tablet 2  . carvedilol (COREG) 3.125 MG tablet Take 1 tablet (3.125 mg total) by mouth 2 (two) times a day. 60 tablet 8  . clopidogrel (PLAVIX) 75 MG tablet TAKE 1 TABLET BY MOUTH ONCE A DAY 30 tablet 11  . Dulaglutide (TRULICITY) 1.5 PJ/0.3PR SOPN Inject 1.5 mg into the skin once a week. 12 pen 3  . ezetimibe (ZETIA) 10 MG tablet TAKE 1 TABLET BY MOUTH ONCE A DAY 30 tablet 5  . glucose blood (ONE TOUCH ULTRA TEST) test strip Check blood sugar twice a day and as directed Dx E11.59 200 each 3  . Injection Device (CORNWALL METAL PIPETTING) MISC Frequency:PHARMDIR   Dosage:0.0     Instructions:  Note:diagnosis 250.02 Dose: NA    . Insulin Degludec (TRESIBA FLEXTOUCH) 200 UNIT/ML SOPN Inject 28 Units into the skin daily. 3 pen 5  . INVOKANA 300 MG TABS tablet TAKE 1 TABLET BY MOUTH ONCE DAILY WITH BREAKFAST 30 tablet 11  . leuprolide (LUPRON) 30 MG injection Inject 45 mg into the muscle every 6 (six) months.    [place] Kitchen losartan (COZAAR) 25 MG tablet TAKE 1/2 TABLET BY MOUTH ONCE A DAY 15 tablet 5  . meclizine (ANTIVERT) 25 MG tablet Take 0.5-1 tablets (12.5-25 mg total) by mouth 2 (two) times daily as needed for dizziness or nausea. [place]  tablet 0  . NITROSTAT 0.4 MG SL tablet Place 1 tablet (0.4 mg total) under the  tongue as needed. 25 tablet 3  . REPATHA SURECLICK 527 MG/ML SOAJ INHECT 1 PEN INTO THE SKIN EVERY 14 DAYS 2 pen 11  . UNIFINE PENTIPS 29G X 12MM MISC USE AS DIRECTED TWICE DAILY 100 each 5   No current facility-administered medications on file prior to visit.    Allergies  Allergen Reactions  . Coreg [Carvedilol] Other (See Comments)    Fatigue- unclear if this was due to coreg specifically or beta blockers in general.    . Metformin And Related Nausea Only  . Protonix [Pantoprazole Sodium] Diarrhea  . Rosuvastatin Calcium     REACTION: JOINT ACHES  . Statins     REACTION: JOINT ACHES   Family History  Problem Relation Age of Onset  . Diabetes Mother 32       DM  . Coronary artery disease Mother   . Hypertension Mother   . Heart attack Mother        CABG with MI in 2005  . Heart disease Mother        CV  . Hypertension Father   . Heart attack Father        CABG with Mi around 1997  . Coronary artery disease Father   . Heart disease Father        CV  . Diabetes Father   . Heart attack Brother        MI/PTCA  . Heart disease Brother        CV  . Diabetes Brother   . Heart attack Maternal Grandfather        MI  . Prostate cancer Paternal Grandfather   . Diabetes Sister        DM  . Breast cancer Neg Hx        Breast/ovarian/uterine cancer  . Colon cancer Neg Hx   . Depression Neg Hx   . Alcohol abuse Neg Hx        ETOH/drug abuse  . Stroke Neg Hx    PE: BP 130/70   Pulse 65   Ht 5\' 9"  (1.753 m)   Wt 233 lb (105.7 kg)   SpO2 98%   BMI 34.41 kg/m  Body mass index is 34.41 kg/m. Wt Readings from Last 3 Encounters:  02/19/19 233 lb (105.7 kg)  01/26/19 233 lb 7 oz (105.9 kg)  12/18/18 226 lb 2 oz (102.6 kg)   Constitutional: overweight, in NAD Eyes: PERRLA, EOMI, no exophthalmos ENT: moist mucous membranes, no thyromegaly, no cervical lymphadenopathy Cardiovascular: RRR, No MRG Respiratory: CTA B Gastrointestinal: abdomen soft, NT, ND,  BS+ Musculoskeletal: no deformities, strength intact in all 4 Skin: moist, warm, no rashes Neurological: no tremor with outstretched hands, DTR normal in all 4  ASSESSMENT: 1. DM2, insulin-dependent, uncontrolled, with complications - CAD - s/p stents, CABG - cerebro-vascular ds - s/p CVA  - PVD - s/p L stent  2. HL  3.  Obesity class II BMI Classification:  < 18.5 underweight   18.5-24.9 normal weight   25.0-29.9 overweight   30.0-34.9 class I obesity   35.0-39.9 class II obesity   ? 40.0 class III obesity   PLAN:  1. Patient with longstanding, insulin resistant diabetes, previously on U500 insulin, then off insulin completely after sugars improved on Invokana and Trulicity.  At that time, he was also doing a good job with his diet and lost weight.  At last visit, however, sugars were higher so we added back insulin in the form of [place]. -At this visit, sugars are still above target despite adding [place].  He also had to increase the dose since last visit.  Sugars in the morning are with the 120s and 170s, and even in the 200s whenever he has a larger meal (e.g. pizza).  At this visit I advised him to increase Tresiba slightly but mostly to work on his dinners. - I suggested to: Patient Instructions  Please continue: - Invokana 300 mg bfore b'fast - Trulicity1.5 mg weekly.  Please increase: - Tresiba to 32 units daily  Please return in 3-4 months with your sugar log.   - we checked his HbA1c: 7.3% (better!) - advised to check sugars at different times of the day - 1x a day, rotating check times - advised for yearly eye exams >> he is UTD - return to clinic in 3-4 months  2. HL - Reviewed latest lipid panel from 11/2018: At goal Lab Results  Component Value Date   CHOL 156 11/28/2018   HDL 40.60 11/28/2018   LDLCALC 92 11/28/2018   LDLDIRECT 157.1 09/07/2013   TRIG 119.0 11/28/2018   CHOLHDL 4 11/28/2018  - Continues Repatha and ezetimibe without side  effects.  She was intolerant to statins.  3. Obesity -Also contributed to by the Lupron injections -He lost weight in the past on the keto diet, 35 pounds.  No significant weight changes since last visit. -We added Trulicity which helps with weight loss we had to start back on insulin, unfortunately, which is weight gain inducing -We will continue Trulicity and Invokana   Philemon Kingdom, MD PhD [facility] Endocrinology

## 2019-02-20 ENCOUNTER — Other Ambulatory Visit: Payer: Self-pay | Admitting: Family Medicine

## 2019-02-22 NOTE — Telephone Encounter (Signed)
Noted. Thanks.

## 2019-05-21 ENCOUNTER — Telehealth: Payer: Self-pay | Admitting: Internal Medicine

## 2019-05-21 MED ORDER — TRESIBA FLEXTOUCH 200 UNIT/ML ~~LOC~~ SOPN: 32.0000 [IU] | PEN_INJECTOR | Freq: Every day | SUBCUTANEOUS | 3 refills | Status: DC

## 2019-05-21 NOTE — Telephone Encounter (Signed)
Pharmacy called and needs an updated RX for patients Tresiba.  Dosing instructions and amount have changed since last filled/written.    Please send updated RX to [facility]

## 2019-05-21 NOTE — Telephone Encounter (Signed)
RX sent

## 2019-05-26 DIAGNOSIS — D3131 Benign neoplasm of right choroid: Secondary | ICD-10-CM | POA: Diagnosis not present

## 2019-05-26 DIAGNOSIS — E119 Type 2 diabetes mellitus without complications: Secondary | ICD-10-CM | POA: Diagnosis not present

## 2019-05-26 LAB — HM DIABETES EYE EXAM

## 2019-06-05 ENCOUNTER — Other Ambulatory Visit: Payer: Self-pay

## 2019-06-05 ENCOUNTER — Other Ambulatory Visit: Payer: Medicare Other

## 2019-06-05 DIAGNOSIS — C61 Malignant neoplasm of prostate: Secondary | ICD-10-CM | POA: Diagnosis not present

## 2019-06-06 LAB — PSA: Prostate Specific Ag, Serum: 0.3 ng/mL (ref 0.0–4.0)

## 2019-06-09 ENCOUNTER — Encounter: Payer: Self-pay | Admitting: Family Medicine

## 2019-06-09 ENCOUNTER — Ambulatory Visit: Payer: Medicare Other | Admitting: Urology

## 2019-06-09 ENCOUNTER — Other Ambulatory Visit: Payer: Self-pay | Admitting: *Deleted

## 2019-06-10 ENCOUNTER — Ambulatory Visit: Payer: Medicare Other | Admitting: Urology

## 2019-06-11 ENCOUNTER — Other Ambulatory Visit: Payer: Self-pay | Admitting: Urology

## 2019-06-11 DIAGNOSIS — C61 Malignant neoplasm of prostate: Secondary | ICD-10-CM

## 2019-06-11 NOTE — Telephone Encounter (Signed)
Pt has appt tomorrow

## 2019-06-12 ENCOUNTER — Encounter: Payer: Self-pay | Admitting: Urology

## 2019-06-12 ENCOUNTER — Ambulatory Visit (INDEPENDENT_AMBULATORY_CARE_PROVIDER_SITE_OTHER): Payer: Medicare Other | Admitting: Urology

## 2019-06-12 ENCOUNTER — Other Ambulatory Visit: Payer: Self-pay

## 2019-06-12 VITALS — BP 118/70 | HR 64 | Ht 69.0 in | Wt 233.0 lb

## 2019-06-12 DIAGNOSIS — C61 Malignant neoplasm of prostate: Secondary | ICD-10-CM | POA: Diagnosis not present

## 2019-06-12 MED ORDER — LEUPROLIDE ACETATE (6 MONTH) 45 MG ~~LOC~~ KIT
45.0000 mg | PACK | Freq: Once | SUBCUTANEOUS | Status: AC
  Administered 2019-06-12: 45 mg via SUBCUTANEOUS

## 2019-06-12 NOTE — Progress Notes (Signed)
06/12/2019 11:09 AM   Phillip Clements 1951-03-25 [id number]  Referring provider: Tonia Ghent, MD [address] [place],  [place] [address]  Chief Complaint  Patient presents with  . Prostate Cancer    Urologic history: 1. Prostate adenocarcinoma  a. 02/2006 PSA returned elevated at 17.49  b. 04/03/2006; PSA 24.7; prostate biopsy Gleason 3+3 with 4/4 cores c. tx with [place]U [place] late 2007  d. persistent PSA elevation over period 2007-2010; nadir 2.1 09/16/06, peak 15.0 ng/mL  e. 04/2006 bone scan and CT abd/pelvis without evidence of metastasis f. 03/08/2007 CT abdomen/pelvis with no definitive metastatic disease noted  g. 03/18/2007 prostascint scan with no evidence of disease but small pelvic lymph nodes noted  h. 08/15/2009 prostascint scan with no evidence of local prostate cancer or distant metastases  i. 08/22/2009 PSA 15 ng/mL; 02/06/2010 PSA 20.5 ng/mL  j. 03/08/2010 PSA 20.9 ng/mL  k. 06/02/10 initiation of degarelix  l. 05/25/10 PET CT (Fluoride) revealed heterogenous areas of FDG-avidity in ribs, femurs, humeri, areas also in the metatarsals noted which may have been arthiritic in nature  m. 07/03/10 PSA 9.6 ng/mL  o.  Started Lupron/Casodex  HPI: 68 y.o. male with metastatic androgen sensitive prostate cancer.  He was last seen February 2020.  He was due for a Lupron injection around July.  PSA 10/23 has increased slightly to 0.3.  Denies bothersome lower urinary tract symptoms, bony pain.  No dysuria or gross hematuria.  PMH: Past Medical History:  Diagnosis Date  . Cataract    bilateral eye in 2010  . Coronary artery disease    a. CABG 04/2014: (LIMA->LAD, VG->DIAG, VG->OM2, VG->PDA)  b. 07/2014 early graft failure (VG->OM2 60, LIMA->LAD atretic, VG->PDA & VG->D1 100). s/p successful rotational atherectomy & DES of the LM & pLAD (3.0x24 Promus DES) & mLAD (2.25x24 Promus DES).  . CVA (cerebral infarction)   . Hx of adenomatous colonic polyps 06/17/2017  .  Hyperlipidemia    a. Statin intolerant.  . Hypertension 2000  . Moderate mitral regurgitation    a. 02/2015 Echo: EF 55-60%, no rwma, mod MR, mod BAE, mildly dil RV w/ low nl RV fxn.  . OSA on CPAP   . Peripheral vascular disease ([place])    a. left SFA angioplasty in December 2016  . Prostate cancer ([place])   . Sleep apnea    wears CPAP nightly  . Type II diabetes mellitus ([place])     Surgical History: Past Surgical History:  Procedure Laterality Date  . ANTERIOR CERVICAL DECOMP/DISCECTOMY FUSION  08/25/2012   Procedure: ANTERIOR CERVICAL DECOMPRESSION/DISCECTOMY FUSION 2 LEVELS;  Surgeon: Hosie Spangle, MD;  Location: [facility];  Service: Neurosurgery;  Laterality: N/A;  Cervical five-six Cervical six-seven anterior cervical decompression with fusion and plating and bonegraft  . BACK SURGERY    . CARDIAC CATHETERIZATION  04/2014; 07/19/2014  . CORONARY ARTERY BYPASS GRAFT N/A 04/26/2014   Procedure: CORONARY ARTERY BYPASS GRAFTING (CABG), on pump, times four, using left internal mammary artery, right greater saphenous vein harvested endoscopically.;  Surgeon: Ivin Poot, MD;  Location: [facility];  Service: Open Heart Surgery;  Laterality: N/A;  LIMA to LAD, SVG to DIAGONAL, SVG to OM1, SVG to PDA) with EVH of the RIGHT THIGH and LOWER EXTREMITY SAPHENOUS VEIN  . Cytoscopy prostatic stone o/w nml  06/21/08   Dr. Jacqlyn Larsen  . ETT myoview  09/13/09   Low risk EF 49%  . High intense focused ultrasound  07/20/06  By Dr. Jacqlyn Larsen  . INTRAOPERATIVE TRANSESOPHAGEAL ECHOCARDIOGRAM N/A 04/26/2014   Procedure: INTRAOPERATIVE TRANSESOPHAGEAL ECHOCARDIOGRAM;  Surgeon: Ivin Poot, MD;  Location: [facility];  Service: Open Heart Surgery;  Laterality: N/A;  . PERCUTANEOUS CORONARY ROTOBLATOR INTERVENTION (PCI-R) N/A 07/22/2014   Procedure: PERCUTANEOUS CORONARY ROTOBLATOR INTERVENTION (PCI-R);  Surgeon: Peter M Martinique, MD;  Location: [facility];  Service: Cardiovascular;  Laterality: N/A;  . PERIPHERAL VASCULAR  CATHETERIZATION Left 07/26/2015   Procedure: Lower Extremity Angiography;  Surgeon: Katha Cabal, MD;  Location: [facility];  Service: Cardiovascular;  Laterality: Left;  . PERIPHERAL VASCULAR CATHETERIZATION  07/26/2015   Procedure: Lower Extremity Intervention;  Surgeon: Katha Cabal, MD;  Location: [facility];  Service: Cardiovascular;;  . PROSTATE BIOPSY  04/03/06 & 02/25/07    Home Medications:  Allergies as of 06/12/2019      Reactions   Coreg [carvedilol] Other (See Comments)   Fatigue- unclear if this was due to coreg specifically or beta blockers in general.     Metformin And Related Nausea Only   Protonix [pantoprazole Sodium] Diarrhea   Rosuvastatin Calcium    REACTION: JOINT ACHES   Statins    REACTION: JOINT ACHES      Medication List       Accurate as of June 12, 2019 11:09 AM. If you have any questions, ask your nurse or doctor.        aspirin EC 81 MG tablet Take 1 tablet (81 mg total) by mouth daily.   B-D ULTRAFINE III SHORT PEN 31G X 8 MM Misc Generic drug: Insulin Pen Needle USE AS DIRECTED TWICE DAILY   Unifine Pentips 29G X 12MM Misc Generic drug: Insulin Pen Needle USE AS DIRECTED TWICE DAILY   bicalutamide 50 MG tablet Commonly known as: CASODEX Take 1 tablet (50 mg total) by mouth daily.   canagliflozin 300 MG Tabs tablet Commonly known as: Invokana TAKE 1 TABLET BY MOUTH ONCE DAILY WITH BREAKFAST   carvedilol 3.125 MG tablet Commonly known as: COREG Take 1 tablet (3.125 mg total) by mouth 2 (two) times a day.   clopidogrel 75 MG tablet Commonly known as: PLAVIX TAKE 1 TABLET BY MOUTH ONCE A DAY   [place] Metal Pipetting Misc Frequency:PHARMDIR   Dosage:0.0     Instructions:  Note:diagnosis 250.02 Dose: NA   Dulaglutide 1.5 MG/0.5ML Sopn Commonly known as: Trulicity Inject 1.5 mg into the skin once a week.   ezetimibe 10 MG tablet Commonly known as: ZETIA TAKE 1 TABLET BY MOUTH ONCE A DAY    leuprolide 30 MG injection Commonly known as: LUPRON Inject 45 mg into the muscle every 6 (six) months.   losartan 25 MG tablet Commonly known as: COZAAR TAKE 1/2 TABLET BY MOUTH ONCE A DAY   meclizine 25 MG tablet Commonly known as: ANTIVERT Take 0.5-1 tablets (12.5-25 mg total) by mouth 2 (two) times daily as needed for dizziness or nausea.   Nitrostat 0.4 MG SL tablet Generic drug: nitroGLYCERIN Place 1 tablet (0.4 mg total) under the tongue as needed.   OneTouch Ultra test strip Generic drug: glucose blood CHECK BLOOD SUGAR TWICE A DAY AND AS DIRECTED DX 123456   Repatha SureClick XX123456 MG/ML [place] Generic drug: Evolocumab INHECT 1 PEN INTO THE SKIN EVERY 14 DAYS   Tresiba FlexTouch 200 UNIT/ML Sopn Generic drug: Insulin Degludec Inject 32 Units into the skin daily.       Allergies:  Allergies  Allergen Reactions  . Coreg [Carvedilol]  Other (See Comments)    Fatigue- unclear if this was due to coreg specifically or beta blockers in general.    . Metformin And Related Nausea Only  . Protonix [Pantoprazole Sodium] Diarrhea  . Rosuvastatin Calcium     REACTION: JOINT ACHES  . Statins     REACTION: JOINT ACHES    Family History: Family History  Problem Relation Age of Onset  . Diabetes Mother 70       DM  . Coronary artery disease Mother   . Hypertension Mother   . Heart attack Mother        CABG with MI in 2005  . Heart disease Mother        CV  . Hypertension Father   . Heart attack Father        CABG with Mi around 1997  . Coronary artery disease Father   . Heart disease Father        CV  . Diabetes Father   . Heart attack Brother        MI/PTCA  . Heart disease Brother        CV  . Diabetes Brother   . Heart attack Maternal Grandfather        MI  . Prostate cancer Paternal Grandfather   . Diabetes Sister        DM  . Breast cancer Neg Hx        Breast/ovarian/uterine cancer  . Colon cancer Neg Hx   . Depression Neg Hx   . Alcohol abuse Neg  Hx        ETOH/drug abuse  . Stroke Neg Hx     Social History:  reports that he quit smoking about 5 years ago. His smoking use included cigarettes. He has a 36.00 pack-year smoking history. He has never used smokeless tobacco. He reports current alcohol use. He reports that he does not use drugs.  ROS: UROLOGY Frequent Urination?: Yes Hard to postpone urination?: No Burning/pain with urination?: No Get up at night to urinate?: Yes Leakage of urine?: No Urine stream starts and stops?: No Trouble starting stream?: No Do you have to strain to urinate?: No Blood in urine?: No Urinary tract infection?: No Sexually transmitted disease?: No Injury to kidneys or bladder?: No Painful intercourse?: No Weak stream?: No Erection problems?: Yes Penile pain?: No  Gastrointestinal Nausea?: No Vomiting?: No Indigestion/heartburn?: No Diarrhea?: No Constipation?: No  Constitutional Fever: No Night sweats?: No Weight loss?: No Fatigue?: No  Skin Skin rash/lesions?: No Itching?: No  Eyes Blurred vision?: No Double vision?: No  Ears/Nose/Throat Sore throat?: No Sinus problems?: No  Hematologic/Lymphatic Swollen glands?: No Easy bruising?: No  Cardiovascular Leg swelling?: No Chest pain?: No  Respiratory Cough?: No Shortness of breath?: No  Endocrine Excessive thirst?: No  Musculoskeletal Back pain?: No Joint pain?: No  Neurological Headaches?: No Dizziness?: No  Psychologic Depression?: No Anxiety?: No  Physical Exam: BP 118/70 (BP Location: Left Arm, Patient Position: Sitting, Cuff Size: Normal)   Pulse 64   Ht 5\' 9"  (1.753 m)   Wt 233 lb (105.7 kg)   BMI 34.41 kg/m   Constitutional:  Alert and oriented, No acute distress. HEENT: [place] AT, moist mucus membranes.  Trachea midline, no masses. Cardiovascular: No clubbing, cyanosis, or edema. Respiratory: Normal respiratory effort, no increased work of breathing. GU: Prostate small <15 cc with mild  induration Lymph: No cervical or inguinal lymphadenopathy. Skin: No rashes, bruises or suspicious lesions. Neurologic: Grossly intact, no  focal deficits, moving all 4 extremities. Psychiatric: Normal mood and affect.   Assessment & Plan:    Prostate cancer Metastatic androgen sensitive prostate cancer.  PSA has increased slightly to 0.3.  He was given Eligard injection today.  Will DC bicalutamide. Follow-up lab visit for PSA in 3 months.  If PSA continue to rise would recommend medical oncology evaluation.  Follow-up with me in 6 months for office visit and Eligard.   Abbie Sons, [place] [address], [place], [place] [address] [phone]

## 2019-06-12 NOTE — Patient Instructions (Signed)

## 2019-06-14 ENCOUNTER — Encounter: Payer: Self-pay | Admitting: Urology

## 2019-06-24 ENCOUNTER — Other Ambulatory Visit: Payer: Self-pay

## 2019-06-26 ENCOUNTER — Encounter: Payer: Self-pay | Admitting: Internal Medicine

## 2019-06-26 ENCOUNTER — Ambulatory Visit (INDEPENDENT_AMBULATORY_CARE_PROVIDER_SITE_OTHER): Payer: Medicare Other | Admitting: Internal Medicine

## 2019-06-26 VITALS — BP 120/70 | HR 74 | Ht 69.0 in | Wt 241.0 lb

## 2019-06-26 DIAGNOSIS — I2510 Atherosclerotic heart disease of native coronary artery without angina pectoris: Secondary | ICD-10-CM | POA: Diagnosis not present

## 2019-06-26 DIAGNOSIS — Z6839 Body mass index (BMI) 39.0-39.9, adult: Secondary | ICD-10-CM | POA: Diagnosis not present

## 2019-06-26 DIAGNOSIS — E785 Hyperlipidemia, unspecified: Secondary | ICD-10-CM

## 2019-06-26 DIAGNOSIS — E1159 Type 2 diabetes mellitus with other circulatory complications: Secondary | ICD-10-CM | POA: Diagnosis not present

## 2019-06-26 DIAGNOSIS — Z23 Encounter for immunization: Secondary | ICD-10-CM

## 2019-06-26 DIAGNOSIS — E1165 Type 2 diabetes mellitus with hyperglycemia: Secondary | ICD-10-CM

## 2019-06-26 DIAGNOSIS — E66812 Obesity, class 2: Secondary | ICD-10-CM

## 2019-06-26 LAB — POCT GLYCOSYLATED HEMOGLOBIN (HGB A1C): Hemoglobin A1C: 7.8 % — AB (ref 4.0–5.6)

## 2019-06-26 MED ORDER — TRESIBA FLEXTOUCH 200 UNIT/ML ~~LOC~~ SOPN: 44.0000 [IU] | PEN_INJECTOR | Freq: Every day | SUBCUTANEOUS | 3 refills | Status: DC

## 2019-06-26 MED ORDER — TRULICITY 3 MG/0.5ML ~~LOC~~ SOAJ: 3.0000 mg | SUBCUTANEOUS | 3 refills | Status: DC

## 2019-06-26 MED ORDER — INSULIN PEN NEEDLE 32G X 4 MM MISC: 3 refills | Status: AC

## 2019-06-26 MED ORDER — ONETOUCH ULTRA VI STRP: ORAL_STRIP | 3 refills | Status: AC

## 2019-06-26 NOTE — Patient Instructions (Addendum)
Please continue: - Invokana  300 mg bfore b'fast - Tresiba 44 units daily  Please increase: - Trulicity to 3 mg weekly.  Check sugars later in the day, also.  Please return in 3-4 months with your sugar log.

## 2019-06-26 NOTE — Progress Notes (Signed)
Patient ID: Phillip Clements, male   DOB: 1951-01-17, 68 y.o.   MRN: [id number]  HPI: Phillip Clements is a 68 y.o.-year-old male, returning for f/u for DM2, dx in 1996, insulin-dependent since 2008, uncontrolled, with complications (CAD - s/p stents, CABG, cerebro-vascular ds - s/p CVA, PVD). Last visit 4 months ago. He changed to [facility] in 10/2016.  At this visit, his sugars are higher, as he relaxed his diet.  She did increase his insulin since last visit without significantly impact on his sugars.  Reviewed HbA1c levels: Lab Results  Component Value Date   HGBA1C 7.2 (A) 02/19/2019   HGBA1C 7.8 (A) 10/30/2018   HGBA1C 7.9 (A) 07/24/2018   Pt was on a regimen of: - Invokana 300 mg before b'fast - Trulicity 1.5 mg weekly - Novolog 40 units 3x a day - 15 min before meals - Toujeo 120  units (60 x2) after dinner He tried Victoza and Januvia. Had GI intolerance (nausea, diarrhea) to regular Metformin and also with metformin ER >>stopped.  He was then on: - Invokana 300 mg before breakfast - Trulicity 1.5 mg weekly - U500 insulin 55-70 units 3 times a day before meals Also, if sugars in the morning are: - 160-200, take 10 units - 201-240, take 15 units - >240, take 20 units  He is currently on: - Invokana  300 mg bfore b'fast - Trulicity 1.5 mg weekly. - Tresiba 32 >> 44 units daily-started 10/2018  He is checking sugars once a day: - am: 120s, 178 >> 200 >> 126-170, 209, 214 >> 119, 150-200 - 2h after b'fast: n/c - not eating b'fast  - before lunch: 100-120 >> n/c >> 126-166 >> n/c - 2h after lunch: n/c - before dinner: 100-120 >> 160s >> 150 >> n/c - 2h after dinner: n/c - bedtime: 201, 214 >> 128-150 >> n/c - nighttime: n/c Lowest sugar was 56 (on insulin) >> ... 110 >> 126 >> 119; he has hypoglycemia awareness in the 70s. Highest sugar was  445 >> ... 200s >> 219 >> 237.  Glucometer: OneTouch Ultra mini  Pt's meals are mostly plant-based: - Breakfast:  skips -  Lunch: sandwich, chips, cottage cheese, pineapple - Dinner: 2 veggies, salads; Sat night: meat - Snacks: 2/day: celery + PB; low salt triscuit + laughing cow cheese  He continues to walk on the treadmill 5/7 days for 30 minutes and he lifts weights for 2/7 days.  -+ Mild CKD, last BUN/creatinine:  Lab Results  Component Value Date   BUN 21 11/28/2018   CREATININE 0.97 11/28/2018  On losartan. -+ HL; last set of lipids: Lab Results  Component Value Date   CHOL 156 11/28/2018   HDL 40.60 11/28/2018   LDLCALC 92 11/28/2018   LDLDIRECT 157.1 09/07/2013   TRIG 119.0 11/28/2018   CHOLHDL 4 11/28/2018  He has intolerance to statins due to joint pain.  He is on Repatha and Zetia. - last eye exam was in 05/2019: No DR; + history of cataract surgery. - no numbness and tingling in his feet.  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  He continues Lupron.  His PSA is detectable, slightly higher at last check.    ROS: Constitutional: + weight gain/no weight loss, no fatigue, no subjective hyperthermia, no subjective hypothermia Eyes: no blurry vision, no xerophthalmia ENT: no sore throat, no nodules palpated in neck, + dysphagia (EGD: GERD), no odynophagia, no hoarseness Cardiovascular: no CP/no SOB/no palpitations/no leg swelling Respiratory: no cough/no  SOB/no wheezing Gastrointestinal: no N/no V/no D/no C/no acid reflux Musculoskeletal: no muscle aches/no joint aches Skin: no rashes, no hair loss Neurological: no tremors/no numbness/no tingling/no dizziness  I reviewed pt's medications, allergies, PMH, social hx, family hx, and changes were documented in the history of present illness. Otherwise, unchanged from my initial visit note.  Past Medical History:  Diagnosis Date  . Cataract    bilateral eye in 2010  . Coronary artery disease    a. CABG 04/2014: (LIMA->LAD, VG->DIAG, VG->OM2, VG->PDA)  b. 07/2014 early graft failure (VG->OM2 60, LIMA->LAD atretic, VG->PDA & VG->D1  100). s/p successful rotational atherectomy & DES of the LM & pLAD (3.0x24 Promus DES) & mLAD (2.25x24 Promus DES).  . CVA (cerebral infarction)   . Hx of adenomatous colonic polyps 06/17/2017  . Hyperlipidemia    a. Statin intolerant.  . Hypertension 2000  . Moderate mitral regurgitation    a. 02/2015 Echo: EF 55-60%, no rwma, mod MR, mod BAE, mildly dil RV w/ low nl RV fxn.  . OSA on CPAP   . Peripheral vascular disease ([place])    a. left SFA angioplasty in December 2016  . Prostate cancer ([place])   . Sleep apnea    wears CPAP nightly  . Type II diabetes mellitus ([place])    Past Surgical History:  Procedure Laterality Date  . ANTERIOR CERVICAL DECOMP/DISCECTOMY FUSION  08/25/2012   Procedure: ANTERIOR CERVICAL DECOMPRESSION/DISCECTOMY FUSION 2 LEVELS;  Surgeon: Hosie Spangle, MD;  Location: [place] [facility];  Service: Neurosurgery;  Laterality: N/A;  Cervical five-six Cervical six-seven anterior cervical decompression with fusion and plating and bonegraft  . BACK SURGERY    . CARDIAC CATHETERIZATION  04/2014; 07/19/2014  . CORONARY ARTERY BYPASS GRAFT N/A 04/26/2014   Procedure: CORONARY ARTERY BYPASS GRAFTING (CABG), on pump, times four, using left internal mammary artery, right greater saphenous vein harvested endoscopically.;  Surgeon: Ivin Poot, MD;  Location: [place];  Service: Open Heart Surgery;  Laterality: N/A;  LIMA to LAD, SVG to DIAGONAL, SVG to OM1, SVG to PDA) with EVH of the RIGHT THIGH and LOWER EXTREMITY SAPHENOUS VEIN  . Cytoscopy prostatic stone o/w nml  06/21/08   Dr. Jacqlyn Larsen  . ETT myoview  09/13/09   Low risk EF 49%  . High intense focused ultrasound  07/20/06   By Dr. Jacqlyn Larsen  . INTRAOPERATIVE TRANSESOPHAGEAL ECHOCARDIOGRAM N/A 04/26/2014   Procedure: INTRAOPERATIVE TRANSESOPHAGEAL ECHOCARDIOGRAM;  Surgeon: Ivin Poot, MD;  Location: [place];  Service: Open Heart Surgery;  Laterality: N/A;  . PERCUTANEOUS CORONARY ROTOBLATOR INTERVENTION (PCI-R) N/A 07/22/2014   Procedure:  PERCUTANEOUS CORONARY ROTOBLATOR INTERVENTION (PCI-R);  Surgeon: Peter M Martinique, MD;  Location: [facility] CATH LAB;  Service: Cardiovascular;  Laterality: N/A;  . PERIPHERAL VASCULAR CATHETERIZATION Left 07/26/2015   Procedure: Lower Extremity Angiography;  Surgeon: Katha Cabal, MD;  Location: [place] CV LAB;  Service: Cardiovascular;  Laterality: Left;  . PERIPHERAL VASCULAR CATHETERIZATION  07/26/2015   Procedure: Lower Extremity Intervention;  Surgeon: Katha Cabal, MD;  Location: [place] CV LAB;  Service: Cardiovascular;;  . PROSTATE BIOPSY  04/03/06 & 02/25/07   Social History   Socioeconomic History  . Marital status: Married    Spouse name: Not on file  . Number of children: 3  . Years of education: Not on file  . Highest education level: Not on file  Occupational History  . Occupation: Insurance account manager  Social Needs  . Financial resource strain: Not  on file  . Food insecurity    Worry: Not on file    Inability: Not on file  . Transportation needs    Medical: Not on file    Non-medical: Not on file  Tobacco Use  . Smoking status: Former Smoker    Packs/day: 1.00    Years: 36.00    Pack years: 36.00    Types: Cigarettes    Quit date: 03/01/2014    Years since quitting: 5.3  . Smokeless tobacco: Never Used  Substance and Sexual Activity  . Alcohol use: Yes    Comment: 07/19/2014 "might have a drink a couple times/yr"  . Drug use: No  . Sexual activity: Not Currently  Lifestyle  . Physical activity    Days per week: Not on file    Minutes per session: Not on file  . Stress: Not on file  Relationships  . Social Herbalist on phone: Not on file    Gets together: Not on file    Attends religious service: Not on file    Active member of club or organization: Not on file    Attends meetings of clubs or organizations: Not on file    Relationship status: Not on file  . Intimate partner violence    Fear of current  or ex partner: Not on file    Emotionally abused: Not on file    Physically abused: Not on file    Forced sexual activity: Not on file  Other Topics Concern  . Not on file  Social History Narrative   Lives with wife.   2 daughters and 1 son.   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     Current Outpatient Medications on File Prior to Visit  Medication Sig Dispense Refill  . aspirin EC 81 MG tablet Take 1 tablet (81 mg total) by mouth daily. 90 tablet 3  . B-D ULTRAFINE III SHORT PEN 31G X 8 MM MISC USE AS DIRECTED TWICE DAILY 100 each 11  . bicalutamide (CASODEX) 50 MG tablet Take 1 tablet (50 mg total) by mouth daily. 90 tablet 2  . canagliflozin (INVOKANA) 300 MG TABS tablet TAKE 1 TABLET BY MOUTH ONCE DAILY WITH BREAKFAST 90 tablet 3  . carvedilol (COREG) 3.125 MG tablet Take 1 tablet (3.125 mg total) by mouth 2 (two) times a day. 60 tablet 8  . clopidogrel (PLAVIX) 75 MG tablet TAKE 1 TABLET BY MOUTH ONCE A DAY 30 tablet 11  . Dulaglutide (TRULICITY) 1.5 0000000 SOPN Inject 1.5 mg into the skin once a week. 12 pen 3  . ezetimibe (ZETIA) 10 MG tablet TAKE 1 TABLET BY MOUTH ONCE A DAY 30 tablet 5  . Injection Device (CORNWALL METAL PIPETTING) MISC Frequency:PHARMDIR   Dosage:0.0     Instructions:  Note:diagnosis 250.02 Dose: NA    . Insulin Degludec (TRESIBA FLEXTOUCH) 200 UNIT/ML SOPN Inject 32 Units into the skin daily. 9 pen 3  . leuprolide (LUPRON) 30 MG injection Inject 45 mg into the muscle every 6 (six) months.    Marland Kitchen losartan (COZAAR) 25 MG tablet TAKE 1/2 TABLET BY MOUTH ONCE A DAY 15 tablet 5  . meclizine (ANTIVERT) 25 MG tablet Take 0.5-1 tablets (12.5-25 mg total) by mouth 2 (two) times daily as needed for dizziness or nausea. 30 tablet 0  . NITROSTAT 0.4 MG SL tablet Place 1 tablet (0.4 mg total) under the tongue as  needed. 25 tablet 3  . ONETOUCH ULTRA test strip CHECK BLOOD SUGAR TWICE A DAY AND AS DIRECTED  DX E11.59 200 strip 3  . REPATHA SURECLICK XX123456 MG/ML SOAJ INHECT 1 PEN INTO THE SKIN EVERY 14 DAYS 2 pen 11  . UNIFINE PENTIPS 29G X 12MM MISC USE AS DIRECTED TWICE DAILY 100 each 5   No current facility-administered medications on file prior to visit.    Allergies  Allergen Reactions  . Coreg [Carvedilol] Other (See Comments)    Fatigue- unclear if this was due to coreg specifically or beta blockers in general.    . Metformin And Related Nausea Only  . Protonix [Pantoprazole Sodium] Diarrhea  . Rosuvastatin Calcium     REACTION: JOINT ACHES  . Statins     REACTION: JOINT ACHES   Family History  Problem Relation Age of Onset  . Diabetes Mother 35       DM  . Coronary artery disease Mother   . Hypertension Mother   . Heart attack Mother        CABG with MI in 2005  . Heart disease Mother        CV  . Hypertension Father   . Heart attack Father        CABG with Mi around 1997  . Coronary artery disease Father   . Heart disease Father        CV  . Diabetes Father   . Heart attack Brother        MI/PTCA  . Heart disease Brother        CV  . Diabetes Brother   . Heart attack Maternal Grandfather        MI  . Prostate cancer Paternal Grandfather   . Diabetes Sister        DM  . Breast cancer Neg Hx        Breast/ovarian/uterine cancer  . Colon cancer Neg Hx   . Depression Neg Hx   . Alcohol abuse Neg Hx        ETOH/drug abuse  . Stroke Neg Hx    PE: BP 120/70   Pulse 74   Ht 5\' 9"  (1.753 m)   Wt 241 lb (109.3 kg)   SpO2 99%   BMI 35.59 kg/m  Body mass index is 35.59 kg/m. Wt Readings from Last 3 Encounters:  06/26/19 241 lb (109.3 kg)  06/12/19 233 lb (105.7 kg)  02/19/19 233 lb (105.7 kg)   Constitutional: overweight, in NAD Eyes: PERRLA, EOMI, no exophthalmos ENT: moist mucous membranes, no thyromegaly, no cervical lymphadenopathy Cardiovascular: RRR, No MRG Respiratory: CTA B Gastrointestinal: abdomen soft, NT, ND, BS+ Musculoskeletal: no  deformities, strength intact in all 4 Skin: moist, warm, no rashes Neurological: no tremor with outstretched hands, DTR normal in all 4  ASSESSMENT: 1. DM2, insulin-dependent, uncontrolled, with complications - CAD - s/p stents, CABG - cerebro-vascular ds - s/p CVA  - PVD - s/p L stent  2. HL  3.  Obesity class II BMI Classification:  < 18.5 underweight   18.5-24.9 normal weight   25.0-29.9 overweight   30.0-34.9 class I obesity   35.0-39.9 class II obesity   ? 40.0 class III obesity   PLAN:  1. Patient with longstanding, insulin resistant diabetes, previously on U500 insulin, and completely after sugars improved on Invokana and Trulicity, but now on long-acting insulin.  At last visit, sugars were still above target despite adding [place] and increasing the dose before last  visit.  We increase Tresiba slightly and advised him to work on his dinners, after which his sugars were in the 200s.  At that time HbA1c was better, at 7.2%. -At this relax his diet he is not checking the morning in the morning and I  strongly advised him to check today during the day, also.  He did increase the insulin dose since last visit but this was not enough to improve his sugars.  We discussed about the absolute importance of diet and especially watching what he eats with dinner and after dinner.  For now, I also suggested to increase his Trulicity dose to help his after meal sugars. - I suggested to: Patient Instructions  Please continue: - Invokana  300 mg bfore b'fast - Tresiba 44 units daily  Please increase: - Trulicity to 3 mg weekly.  Check sugars later in the day, also.  Please return in 3-4 months with your sugar log.   - we checked his HbA1c: 7.8% (higher) - advised to check sugars at different times of the day - 1-2x a day, rotating check times - advised for yearly eye exams >> he is UTD - + Flu shot today - return to clinic in 3-4 months  2. HL -Reviewed latest lipid panel  from 11/2018: All fractions at goal Lab Results  Component Value Date   CHOL 156 11/28/2018   HDL 40.60 11/28/2018   LDLCALC 92 11/28/2018   LDLDIRECT 157.1 09/07/2013   TRIG 119.0 11/28/2018   CHOLHDL 4 11/28/2018  -She was intolerant to statins - continues Repatha and  ezetimibe without side effects.  3. Obesity -Also contributed to by the Lupron injections -In the past, he lost a significant amount of weight (35 pounds) on keto diet. Gained 8 lbs before last OV. -We will continue Trulicity and Invokana, which should both help with weight loss, and we will also increase the Trulicity dose now.  Philemon Kingdom, MD PhD [facility]

## 2019-06-26 NOTE — Addendum Note (Signed)
Addended by: Cardell Peach I on: 06/26/2019 09:26 AM   Modules accepted: Orders

## 2019-08-06 ENCOUNTER — Other Ambulatory Visit: Payer: Self-pay

## 2019-08-06 DIAGNOSIS — I2510 Atherosclerotic heart disease of native coronary artery without angina pectoris: Secondary | ICD-10-CM

## 2019-08-06 DIAGNOSIS — E785 Hyperlipidemia, unspecified: Secondary | ICD-10-CM

## 2019-08-06 DIAGNOSIS — R079 Chest pain, unspecified: Secondary | ICD-10-CM

## 2019-08-06 DIAGNOSIS — I10 Essential (primary) hypertension: Secondary | ICD-10-CM

## 2019-08-06 MED ORDER — LOSARTAN POTASSIUM 25 MG PO TABS: ORAL_TABLET | ORAL | 5 refills | Status: DC

## 2019-08-26 ENCOUNTER — Other Ambulatory Visit: Payer: Self-pay | Admitting: Cardiovascular Disease

## 2019-08-28 DIAGNOSIS — G4733 Obstructive sleep apnea (adult) (pediatric): Secondary | ICD-10-CM | POA: Diagnosis not present

## 2019-09-10 ENCOUNTER — Other Ambulatory Visit: Payer: Self-pay

## 2019-09-10 DIAGNOSIS — C61 Malignant neoplasm of prostate: Secondary | ICD-10-CM

## 2019-09-11 ENCOUNTER — Other Ambulatory Visit: Payer: Self-pay

## 2019-09-11 ENCOUNTER — Other Ambulatory Visit: Payer: Medicare Other

## 2019-09-11 DIAGNOSIS — Z23 Encounter for immunization: Secondary | ICD-10-CM | POA: Diagnosis not present

## 2019-09-11 DIAGNOSIS — C61 Malignant neoplasm of prostate: Secondary | ICD-10-CM | POA: Diagnosis not present

## 2019-09-12 LAB — PSA: Prostate Specific Ag, Serum: 0.9 ng/mL (ref 0.0–4.0)

## 2019-09-13 ENCOUNTER — Telehealth: Payer: Self-pay | Admitting: Urology

## 2019-09-13 DIAGNOSIS — C61 Malignant neoplasm of prostate: Secondary | ICD-10-CM

## 2019-09-13 NOTE — Telephone Encounter (Signed)
PSA has increased to 0.9.  Recommend medical oncology evaluation.  Referral was placed.

## 2019-09-14 NOTE — Telephone Encounter (Signed)
Notified patient as instructed, patient pleased. Discussed follow-up appointments, patient agrees  

## 2019-09-15 ENCOUNTER — Inpatient Hospital Stay: Payer: Medicare Other | Attending: Internal Medicine | Admitting: Internal Medicine

## 2019-09-15 ENCOUNTER — Other Ambulatory Visit: Payer: Self-pay

## 2019-09-15 ENCOUNTER — Encounter: Payer: Self-pay | Admitting: Internal Medicine

## 2019-09-15 VITALS — BP 126/63 | HR 59 | Temp 97.0°F | Wt 244.0 lb

## 2019-09-15 DIAGNOSIS — C7951 Secondary malignant neoplasm of bone: Secondary | ICD-10-CM | POA: Diagnosis not present

## 2019-09-15 DIAGNOSIS — I340 Nonrheumatic mitral (valve) insufficiency: Secondary | ICD-10-CM | POA: Insufficient documentation

## 2019-09-15 DIAGNOSIS — C61 Malignant neoplasm of prostate: Secondary | ICD-10-CM | POA: Diagnosis not present

## 2019-09-15 DIAGNOSIS — I2510 Atherosclerotic heart disease of native coronary artery without angina pectoris: Secondary | ICD-10-CM | POA: Diagnosis not present

## 2019-09-15 DIAGNOSIS — I10 Essential (primary) hypertension: Secondary | ICD-10-CM | POA: Insufficient documentation

## 2019-09-15 DIAGNOSIS — E1136 Type 2 diabetes mellitus with diabetic cataract: Secondary | ICD-10-CM

## 2019-09-15 NOTE — Assessment & Plan Note (Addendum)
#  Prostate cancer-metastatic to bone [PET scan 2011].  Patient most recently on Lupron/Casodex. PSA jumped from 0.3 to 0.9 [after taking off Casodex].  Concerned about castrate resistance prostate cancer.  #I would recommend baseline repeat testing with the PET scan to evaluate the status of disease.  Also discussed the options of-newer antiandrogen therapy-including but not limited to Zytiga plus prednisone [less prevention given his diabetes]/enzalutamide/apalutamide/ darolutamide.   #Prostate cancer-at age of 69.  I think patient would benefit from genetic counseling-on a personal basis [open new therapeutic options]; also for his family-son age 7.  We will make a referral to genetic counseling  # Thank you Dr. Bernardo Heater for allowing me to participate in the care of your pleasant patient. Please do not hesitate to contact me with questions or concerns in the interim.  # DISPOSITION: # PET scan- 1week or so.  # genetic counselling referral: Dx; Prostate cancer # follow up in 2 weeks- MD: no labs- Dr.B

## 2019-09-15 NOTE — Progress Notes (Signed)
[name] CONSULT NOTE  Patient Care Team: Tonia Ghent, MD as PCP - General (Family Medicine) Wellington Hampshire, MD as PCP - Cardiology (Cardiology)  CHIEF COMPLAINTS/PURPOSE OF CONSULTATION: Prostate cancer  #  Oncology History Overview Note  1. Prostate adenocarcinoma  a. 02/2006 PSA returned elevated at 17.49  b. 04/03/2006; PSA 24.7; prostate biopsy Gleason 3+3 with 4/4 cores c. tx with HIFU overseas late 2007 in [place] [Dr.Cope]] d. persistent PSA elevation over period 2007-2010; nadir 2.1 09/16/06, peak 15.0 ng/mL  e. 04/2006 bone scan and CT abd/pelvis without evidence of metastasis f. 03/08/2007 CT abdomen/pelvis with no definitive metastatic disease noted  g. 03/18/2007 prostascint scan with no evidence of disease but small pelvic lymph nodes noted  h. 08/15/2009 prostascint scan with no evidence of local prostate cancer or distant metastases  i. 08/22/2009 PSA 15 ng/mL; 02/06/2010 PSA 20.5 ng/mL  j. 03/08/2010 PSA 20.9 ng/mL  k. 06/02/10 initiation of degarelix  l. 05/25/10 PET CT (Fluoride) revealed heterogenous areas of FDG-avidity in ribs, femurs, humeri, areas also in the metatarsals noted which may have been arthiritic in nature  m. 07/03/10 PSA 9.6 ng/mL  o.  Started Lupron/Casodex-  #comorbidities-CAD s/p CABG; diabetes; PVD  # NGS/MOLECULAR TESTS:NA  # PALLIATIVE CARE EVALUATION: NA  # PAIN MANAGEMENT: NA   DIAGNOSIS:   STAGE:         ;  GOALS:  CURRENT/MOST RECENT THERAPY :     Prostate cancer ([place])  09/16/2007 Initial Diagnosis   Prostate cancer ([place])      HISTORY OF PRESENTING ILLNESS:  Phillip Clements 69 y.o.  male with longstanding history of prostate cancer metastatic to bone is here for initial consultation.  Patient was diagnosed in 2007-patient underwent high-frequency ultrasound ablation of the prostate in [place].  Patient subsequently noted to have relapse of PSA for which he was started on Lupron.  Patient was subsequently  started on Casodex-and PET scan showed metastatic disease in the bone.   More recently patient noted to have slightly above PSA from 0.2 to 0.3 -in summer 2020.  As per patient-Casodex was withdrawn-but noted to have a jump of PSA to 0.9.  Patient is referred to [place] for further evaluation recommendations.  Patient denies any worsening bone pain.  Denies any new onset of shortness of breath or cough.  Complains of losing muscle mass.  Mild hot flashes.  Review of Systems  Constitutional: Positive for malaise/fatigue. Negative for chills, diaphoresis, fever and weight loss.  HENT: Negative for nosebleeds and sore throat.   Eyes: Negative for double vision.  Respiratory: Negative for cough, hemoptysis, sputum production, shortness of breath and wheezing.   Cardiovascular: Negative for chest pain, palpitations, orthopnea and leg swelling.  Gastrointestinal: Negative for abdominal pain, blood in stool, constipation, diarrhea, heartburn, melena, nausea and vomiting.  Genitourinary: Negative for dysuria, frequency and urgency.  Musculoskeletal: Positive for back pain. Negative for joint pain.  Skin: Negative.  Negative for itching and rash.  Neurological: Negative for dizziness, tingling, focal weakness, weakness and headaches.  Endo/Heme/Allergies: Does not bruise/bleed easily.  Psychiatric/Behavioral: Negative for depression. The patient is not nervous/anxious and does not have insomnia.      MEDICAL HISTORY:  Past Medical History:  Diagnosis Date  . Cataract    bilateral eye in 2010  . Coronary artery disease    a. CABG 04/2014: (LIMA->LAD, VG->DIAG, VG->OM2, VG->PDA)  b. 07/2014 early graft failure (VG->OM2 60, LIMA->LAD atretic, VG->PDA & VG->D1 100). s/p successful rotational  atherectomy & DES of the LM & pLAD (3.0x24 Promus DES) & mLAD (2.25x24 Promus DES).  . CVA (cerebral infarction)   . Hx of adenomatous colonic polyps 06/17/2017  . Hyperlipidemia    a. Statin intolerant.  .  Hypertension 2000  . Moderate mitral regurgitation    a. 02/2015 Echo: EF 55-60%, no rwma, mod MR, mod BAE, mildly dil RV w/ low nl RV fxn.  . OSA on CPAP   . Peripheral vascular disease ([place])    a. left SFA angioplasty in December 2016  . Prostate cancer ([place])   . Sleep apnea    wears CPAP nightly  . Type II diabetes mellitus ([place])     SURGICAL HISTORY: Past Surgical History:  Procedure Laterality Date  . ANTERIOR CERVICAL DECOMP/DISCECTOMY FUSION  08/25/2012   Procedure: ANTERIOR CERVICAL DECOMPRESSION/DISCECTOMY FUSION 2 LEVELS;  Surgeon: Hosie Spangle, MD;  Location: [facility];  Service: Neurosurgery;  Laterality: N/A;  Cervical five-six Cervical six-seven anterior cervical decompression with fusion and plating and bonegraft  . BACK SURGERY    . CARDIAC CATHETERIZATION  04/2014; 07/19/2014  . CORONARY ARTERY BYPASS GRAFT N/A 04/26/2014   Procedure: CORONARY ARTERY BYPASS GRAFTING (CABG), on pump, times four, using left internal mammary artery, right greater saphenous vein harvested endoscopically.;  Surgeon: Ivin Poot, MD;  Location: [facility];  Service: Open Heart Surgery;  Laterality: N/A;  LIMA to LAD, SVG to DIAGONAL, SVG to OM1, SVG to PDA) with EVH of the RIGHT THIGH and LOWER EXTREMITY SAPHENOUS VEIN  . Cytoscopy prostatic stone o/w nml  06/21/08   Dr. Jacqlyn Larsen  . ETT myoview  09/13/09   Low risk EF 49%  . High intense focused ultrasound  07/20/06   By Dr. Jacqlyn Larsen  . INTRAOPERATIVE TRANSESOPHAGEAL ECHOCARDIOGRAM N/A 04/26/2014   Procedure: INTRAOPERATIVE TRANSESOPHAGEAL ECHOCARDIOGRAM;  Surgeon: Ivin Poot, MD;  Location: [facility];  Service: Open Heart Surgery;  Laterality: N/A;  . PERCUTANEOUS CORONARY ROTOBLATOR INTERVENTION (PCI-R) N/A 07/22/2014   Procedure: PERCUTANEOUS CORONARY ROTOBLATOR INTERVENTION (PCI-R);  Surgeon: Peter M Martinique, MD;  Location: [facility];  Service: Cardiovascular;  Laterality: N/A;  . PERIPHERAL VASCULAR CATHETERIZATION Left 07/26/2015   Procedure:  Lower Extremity Angiography;  Surgeon: Katha Cabal, MD;  Location: [facility];  Service: Cardiovascular;  Laterality: Left;  . PERIPHERAL VASCULAR CATHETERIZATION  07/26/2015   Procedure: Lower Extremity Intervention;  Surgeon: Katha Cabal, MD;  Location: [facility];  Service: Cardiovascular;;  . PROSTATE BIOPSY  04/03/06 & 02/25/07    SOCIAL HISTORY: Social History   Socioeconomic History  . Marital status: Married    Spouse name: Not on file  . Number of children: 3  . Years of education: Not on file  . Highest education level: Not on file  Occupational History  . Occupation: Insurance account manager  Tobacco Use  . Smoking status: Former Smoker    Packs/day: 1.00    Years: 36.00    Pack years: 36.00    Types: Cigarettes    Quit date: 03/01/2014    Years since quitting: 5.5  . Smokeless tobacco: Never Used  Substance and Sexual Activity  . Alcohol use: Yes    Comment: 07/19/2014 "might have a drink a couple times/yr"  . Drug use: No  . Sexual activity: Not Currently  Other Topics Concern  . 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 In [place].    Social Determinants of Health   Financial Resource Strain:   . Difficulty of Paying Living Expenses: Not on file  Food Insecurity:   . Worried About Charity fundraiser in the Last Year: Not on file  . Ran Out of Food in the Last Year: Not on file  Transportation Needs:   . Lack of Transportation (Medical): Not on file  . Lack of Transportation (Non-Medical): Not on file  Physical Activity:   . Days of Exercise per Week: Not on file  . Minutes of Exercise per Session: Not on file  Stress:   . Feeling of Stress : Not on file  Social Connections:   . Frequency of Communication  with Friends and Family: Not on file  . Frequency of Social Gatherings with Friends and Family: Not on file  . Attends Religious Services: Not on file  . Active Member of Clubs or Organizations: Not on file  . Attends Archivist Meetings: Not on file  . Marital Status: Not on file  Intimate Partner Violence:   . Fear of Current or Ex-Partner: Not on file  . Emotionally Abused: Not on file  . Physically Abused: Not on file  . Sexually Abused: Not on file    FAMILY HISTORY: Family History  Problem Relation Age of Onset  . Diabetes Mother 8       DM  . Coronary artery disease Mother   . Hypertension Mother   . Heart attack Mother        CABG with MI in 2005  . Heart disease Mother        CV  . Hypertension Father   . Heart attack Father        CABG with Mi around 1997  . Coronary artery disease Father   . Heart disease Father        CV  . Diabetes Father   . Heart attack Brother        MI/PTCA  . Heart disease Brother        CV  . Diabetes Brother   . Heart attack Maternal Grandfather        MI  . Prostate cancer Paternal Grandfather   . Diabetes Sister        DM  . Breast cancer Neg Hx        Breast/ovarian/uterine cancer  . Colon cancer Neg Hx   . Depression Neg Hx   . Alcohol abuse Neg Hx        ETOH/drug abuse  . Stroke Neg Hx     ALLERGIES:  is allergic to coreg [carvedilol]; metformin and related; protonix [pantoprazole sodium]; rosuvastatin calcium; and statins.  MEDICATIONS:  Current Outpatient Medications  Medication Sig Dispense Refill  . aspirin EC 81 MG tablet Take 1 tablet (81 mg total) by mouth daily. 90 tablet 3  . bicalutamide (CASODEX) 50 MG tablet Take 1 tablet (50 mg total) by mouth daily. 90 tablet 2  . canagliflozin (INVOKANA) 300 MG TABS tablet TAKE 1 TABLET BY MOUTH ONCE DAILY WITH BREAKFAST 90 tablet 3  . carvedilol (COREG) 3.125 MG tablet Take 1 tablet (3.125 mg total) by mouth 2 (two) times a day. 60 tablet 8  . clopidogrel  (PLAVIX) 75 MG tablet TAKE 1 TABLET BY MOUTH ONCE A DAY 30 tablet 11  . Dulaglutide (TRULICITY) 3 0000000 SOPN  Inject 3 mg into the skin once a week. 12 pen 3  . ezetimibe (ZETIA) 10 MG tablet TAKE 1 TABLET BY MOUTH ONCE DAILY 30 tablet 5  . glucose blood (ONETOUCH ULTRA) test strip CHECK BLOOD SUGAR TWICE A DAY - DX E11.59 200 strip 3  . Injection Device (CORNWALL METAL PIPETTING) MISC Frequency:PHARMDIR   Dosage:0.0     Instructions:  Note:diagnosis 250.02 Dose: NA    . Insulin Degludec (TRESIBA FLEXTOUCH) 200 UNIT/ML SOPN Inject 44 Units into the skin daily. 12 pen 3  . Insulin Pen Needle 32G X 4 MM MISC Use 1x a day 100 each 3  . leuprolide (LUPRON) 30 MG injection Inject 45 mg into the muscle every 6 (six) months.    Marland Kitchen losartan (COZAAR) 25 MG tablet TAKE 1/2 TABLET BY MOUTH ONCE A DAY 15 tablet 5  . meclizine (ANTIVERT) 25 MG tablet Take 0.5-1 tablets (12.5-25 mg total) by mouth 2 (two) times daily as needed for dizziness or nausea. 30 tablet 0  . NITROSTAT 0.4 MG SL tablet Place 1 tablet (0.4 mg total) under the tongue as needed. 25 tablet 3  . REPATHA SURECLICK [id number] MG/ML SOAJ INHECT 1 PEN INTO THE SKIN EVERY 14 DAYS 2 pen 11   No current facility-administered medications for this visit.      Marland Kitchen  PHYSICAL EXAMINATION: ECOG PERFORMANCE STATUS: 0 - Asymptomatic  Vitals:   09/15/19 1120  BP: 126/63  Pulse: (!) 59  Temp: (!) 97 F (36.1 C)   Filed Weights   09/15/19 1120  Weight: 244 lb (110.7 kg)    Physical Exam  Constitutional: He is oriented to person, place, and time and well-developed, well-nourished, and in no distress.  HENT:  Head: Normocephalic and atraumatic.  Mouth/Throat: Oropharynx is clear and moist. No oropharyngeal exudate.  Eyes: Pupils are equal, round, and reactive to light.  Cardiovascular: Normal rate and regular rhythm.  Pulmonary/Chest: Effort normal and breath sounds normal. No respiratory distress. He has no wheezes.  Abdominal: Soft. Bowel sounds  are normal. He exhibits no distension and no mass. There is no abdominal tenderness. There is no rebound and no guarding.  Musculoskeletal:        General: No tenderness or edema. Normal range of motion.     Cervical back: Normal range of motion and neck supple.  Neurological: He is alert and oriented to person, place, and time.  Skin: Skin is warm.  Psychiatric: Affect normal.    LABORATORY DATA:  I have reviewed the data as listed Lab Results  Component Value Date   WBC 5.9 11/28/2018   HGB 15.4 11/28/2018   HCT 43.9 11/28/2018   MCV 85.5 11/28/2018   PLT 212.0 11/28/2018   Recent Labs    11/28/18 0859  NA 139  K 4.3  CL 104  CO2 28  GLUCOSE 153*  BUN 21  CREATININE 0.97  CALCIUM 9.0  PROT 6.7  ALBUMIN 4.2  AST 12  ALT 13  ALKPHOS 60  BILITOT 0.9    RADIOGRAPHIC STUDIES: I have personally reviewed the radiological images as listed and agreed with the findings in the report. No results found.  ASSESSMENT & PLAN:   Prostate cancer ([place]) #Prostate cancer-metastatic to bone [PET scan 2011].  Patient most recently on Lupron/Casodex. PSA jumped from 0.3 to 0.9 [after taking off Casodex].  Concerned about castrate resistance prostate cancer.  #I would recommend baseline repeat testing with the PET scan to evaluate the status of disease.  Also  discussed the options of-newer antiandrogen therapy-including but not limited to Zytiga plus prednisone [less prevention given his diabetes]/enzalutamide/apalutamide/ darolutamide.   #Prostate cancer-at age of 42.  I think patient would benefit from genetic counseling-on a personal basis [open new therapeutic options]; also for his family-son age 33.  We will make a referral to genetic counseling  # Thank you Dr. Bernardo Heater for allowing me to participate in the care of your pleasant patient. Please do not hesitate to contact me with questions or concerns in the interim.  # DISPOSITION: # PET scan- 1week or so.  # genetic  counselling referral: Dx; Prostate cancer # follow up in 2 weeks- MD: no labs- Dr.B  All questions were answered. The patient knows to call the clinic with any problems, questions or concerns.    Cammie Sickle, MD 09/15/2019 4:15 PM

## 2019-09-16 ENCOUNTER — Other Ambulatory Visit: Payer: Self-pay | Admitting: Cardiovascular Disease

## 2019-09-24 ENCOUNTER — Encounter
Admission: RE | Admit: 2019-09-24 | Discharge: 2019-09-24 | Disposition: A | Discharge status: Home / Self Care | Payer: Medicare Other | Source: Ambulatory Visit | Attending: Internal Medicine | Admitting: Internal Medicine

## 2019-09-24 ENCOUNTER — Other Ambulatory Visit: Payer: Self-pay

## 2019-09-24 DIAGNOSIS — C61 Malignant neoplasm of prostate: Secondary | ICD-10-CM | POA: Diagnosis not present

## 2019-09-24 MED ORDER — AXUMIN (FLUCICLOVINE F 18) INJECTION
10.0000 | Freq: Once | INTRAVENOUS | Status: AC | PRN
  Administered 2019-09-24: 14:00:00 10.74 via INTRAVENOUS

## 2019-09-28 ENCOUNTER — Other Ambulatory Visit: Payer: Self-pay | Admitting: Internal Medicine

## 2019-09-28 ENCOUNTER — Other Ambulatory Visit: Payer: Self-pay

## 2019-09-29 ENCOUNTER — Inpatient Hospital Stay (HOSPITAL_BASED_OUTPATIENT_CLINIC_OR_DEPARTMENT_OTHER): Payer: Medicare Other | Admitting: Internal Medicine

## 2019-09-29 ENCOUNTER — Other Ambulatory Visit: Payer: Self-pay

## 2019-09-29 DIAGNOSIS — I2510 Atherosclerotic heart disease of native coronary artery without angina pectoris: Secondary | ICD-10-CM | POA: Diagnosis not present

## 2019-09-29 DIAGNOSIS — C61 Malignant neoplasm of prostate: Secondary | ICD-10-CM | POA: Diagnosis not present

## 2019-09-29 DIAGNOSIS — C7951 Secondary malignant neoplasm of bone: Secondary | ICD-10-CM | POA: Diagnosis not present

## 2019-09-29 DIAGNOSIS — E1136 Type 2 diabetes mellitus with diabetic cataract: Secondary | ICD-10-CM | POA: Diagnosis not present

## 2019-09-29 DIAGNOSIS — I10 Essential (primary) hypertension: Secondary | ICD-10-CM | POA: Diagnosis not present

## 2019-09-29 DIAGNOSIS — I340 Nonrheumatic mitral (valve) insufficiency: Secondary | ICD-10-CM | POA: Diagnosis not present

## 2019-09-29 NOTE — Progress Notes (Signed)
[facility] CONSULT NOTE  Patient Care Team: Tonia Ghent, MD as PCP - General (Family Medicine) Wellington Hampshire, MD as PCP - Cardiology (Cardiology)  CHIEF COMPLAINTS/PURPOSE OF CONSULTATION: Prostate cancer  #  Oncology History Overview Note  1. Prostate adenocarcinoma  a. 02/2006 PSA returned elevated at 17.49  b. 04/03/2006; PSA 24.7; prostate biopsy Gleason 3+3 with 4/4 cores c. tx with HIFU overseas late 2007 in [place] [Dr.Cope]] d. persistent PSA elevation over period 2007-2010; nadir 2.1 09/16/06, peak 15.0 ng/mL  e. 04/2006 bone scan and CT abd/pelvis without evidence of metastasis f. 03/08/2007 CT abdomen/pelvis with no definitive metastatic disease noted  g. 03/18/2007 prostascint scan with no evidence of disease but small pelvic lymph nodes noted  h. 08/15/2009 prostascint scan with no evidence of local prostate cancer or distant metastases  i. 08/22/2009 PSA 15 ng/mL; 02/06/2010 PSA 20.5 ng/mL  j. 03/08/2010 PSA 20.9 ng/mL  k. 06/02/10 initiation of degarelix  l. 05/25/10 PET CT (Fluoride) revealed heterogenous areas of FDG-avidity in ribs, femurs, humeri, areas also in the metatarsals noted which may have been arthiritic in nature  m. 07/03/10 PSA 9.6 ng/mL  o.  Started Lupron/Casodex; June 2020-PSA from 0.2 to 0.3 -I;  As per patient-Casodex was withdrawn-but noted to have a jump of PSA to 0.9. FEB 2021-evidence of prostatic malignancy in the residual prostate bed; no distant metastatic disease  #comorbidities-CAD s/p CABG; diabetes; PVD  # NGS/MOLECULAR TESTS:NA  # PALLIATIVE CARE EVALUATION: NA  # PAIN MANAGEMENT: NA   DIAGNOSIS:   STAGE:         ;  GOALS:  CURRENT/MOST RECENT THERAPY :     Prostate cancer ([place])  09/16/2007 Initial Diagnosis   Prostate cancer ([place])      HISTORY OF PRESENTING ILLNESS:  Phillip Clements 69 y.o.  male with longstanding history of prostate cancer metastatic to bone/Lupron Casodex is here for follow-up/to review the  results of the PET scan.  Patient denies any symptoms.  Denies any shortness of breath or cough.  Continues to complain of losing muscle mass.  Mild hot flashes.   Review of Systems  Constitutional: Positive for malaise/fatigue. Negative for chills, diaphoresis, fever and weight loss.  HENT: Negative for nosebleeds and sore throat.   Eyes: Negative for double vision.  Respiratory: Negative for cough, hemoptysis, sputum production, shortness of breath and wheezing.   Cardiovascular: Negative for chest pain, palpitations, orthopnea and leg swelling.  Gastrointestinal: Negative for abdominal pain, blood in stool, constipation, diarrhea, heartburn, melena, nausea and vomiting.  Genitourinary: Negative for dysuria, frequency and urgency.  Musculoskeletal: Positive for back pain. Negative for joint pain.  Skin: Negative.  Negative for itching and rash.  Neurological: Negative for dizziness, tingling, focal weakness, weakness and headaches.  Endo/Heme/Allergies: Does not bruise/bleed easily.  Psychiatric/Behavioral: Negative for depression. The patient is not nervous/anxious and does not have insomnia.      MEDICAL HISTORY:  Past Medical History:  Diagnosis Date  . Cataract    bilateral eye in 2010  . Coronary artery disease    a. CABG 04/2014: (LIMA->LAD, VG->DIAG, VG->OM2, VG->PDA)  b. 07/2014 early graft failure (VG->OM2 60, LIMA->LAD atretic, VG->PDA & VG->D1 100). s/p successful rotational atherectomy & DES of the LM & pLAD (3.0x24 Promus DES) & mLAD (2.25x24 Promus DES).  . CVA (cerebral infarction)   . Hx of adenomatous colonic polyps 06/17/2017  . Hyperlipidemia    a. Statin intolerant.  . Hypertension 2000  . Moderate mitral regurgitation  a. 02/2015 Echo: EF 55-60%, no rwma, mod MR, mod BAE, mildly dil RV w/ low nl RV fxn.  . OSA on CPAP   . Peripheral vascular disease ([place])    a. left SFA angioplasty in December 2016  . Prostate cancer ([place])   . Sleep apnea    wears CPAP  nightly  . Type II diabetes mellitus ([place])     SURGICAL HISTORY: Past Surgical History:  Procedure Laterality Date  . ANTERIOR CERVICAL DECOMP/DISCECTOMY FUSION  08/25/2012   Procedure: ANTERIOR CERVICAL DECOMPRESSION/DISCECTOMY FUSION 2 LEVELS;  Surgeon: Hosie Spangle, MD;  Location: [facility];  Service: Neurosurgery;  Laterality: N/A;  Cervical five-six Cervical six-seven anterior cervical decompression with fusion and plating and bonegraft  . BACK SURGERY    . CARDIAC CATHETERIZATION  04/2014; 07/19/2014  . CORONARY ARTERY BYPASS GRAFT N/A 04/26/2014   Procedure: CORONARY ARTERY BYPASS GRAFTING (CABG), on pump, times four, using left internal mammary artery, right greater saphenous vein harvested endoscopically.;  Surgeon: Ivin Poot, MD;  Location: [facility];  Service: Open Heart Surgery;  Laterality: N/A;  LIMA to LAD, SVG to DIAGONAL, SVG to OM1, SVG to PDA) with EVH of the RIGHT THIGH and LOWER EXTREMITY SAPHENOUS VEIN  . Cytoscopy prostatic stone o/w nml  06/21/08   Dr. Jacqlyn Larsen  . ETT myoview  09/13/09   Low risk EF 49%  . High intense focused ultrasound  07/20/06   By Dr. Jacqlyn Larsen  . INTRAOPERATIVE TRANSESOPHAGEAL ECHOCARDIOGRAM N/A 04/26/2014   Procedure: INTRAOPERATIVE TRANSESOPHAGEAL ECHOCARDIOGRAM;  Surgeon: Ivin Poot, MD;  Location: [facility];  Service: Open Heart Surgery;  Laterality: N/A;  . PERCUTANEOUS CORONARY ROTOBLATOR INTERVENTION (PCI-R) N/A 07/22/2014   Procedure: PERCUTANEOUS CORONARY ROTOBLATOR INTERVENTION (PCI-R);  Surgeon: Peter M Martinique, MD;  Location[facility];  Service: Cardiovascular;  Laterality: N/A;  . PERIPHERAL VASCULAR CATHETERIZATION Left 07/26/2015   Procedure: Lower Extremity Angiography;  Surgeon: Katha Cabal, MD;  Location: [facility] CV LAB;  Service: Cardiovascular;  Laterality: Left;  . PERIPHERAL VASCULAR CATHETERIZATION  07/26/2015   Procedure: Lower Extremity Intervention;  Surgeon: Katha Cabal, MD;  Location: [facility] CV  LAB;  Service: Cardiovascular;;  . PROSTATE BIOPSY  04/03/06 & 02/25/07    SOCIAL HISTORY: Social History   Socioeconomic History  . Marital status: Married    Spouse name: Not on file  . Number of children: 3  . Years of education: Not on file  . Highest education level: Not on file  Occupational History  . Occupation: Insurance account manager  Tobacco Use  . Smoking status: Former Smoker    Packs/day: 1.00    Years: 36.00    Pack years: 36.00    Types: Cigarettes    Quit date: 03/01/2014    Years since quitting: 5.5  . Smokeless tobacco: Never Used  Substance and Sexual Activity  . Alcohol use: Yes    Comment: 07/19/2014 "might have a drink a couple times/yr"  . Drug use: No  . Sexual activity: Not Currently  Other Topics Concern  . 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 In [place].    Social Determinants of Health   Financial Resource Strain:   . Difficulty of Paying Living Expenses: Not on file  Food Insecurity:   . Worried About  Running Out of Food in the Last Year: Not on file  . Ran Out of Food in the Last Year: Not on file  Transportation Needs:   . Lack of Transportation (Medical): Not on file  . Lack of Transportation (Non-Medical): Not on file  Physical Activity:   . Days of Exercise per Week: Not on file  . Minutes of Exercise per Session: Not on file  Stress:   . Feeling of Stress : Not on file  Social Connections:   . Frequency of Communication with Friends and Family: Not on file  . Frequency of Social Gatherings with Friends and Family: Not on file  . Attends Religious Services: Not on file  . Active Member of Clubs or Organizations: Not on file  . Attends Archivist Meetings: Not on file  . Marital Status: Not on file   Intimate Partner Violence:   . Fear of Current or Ex-Partner: Not on file  . Emotionally Abused: Not on file  . Physically Abused: Not on file  . Sexually Abused: Not on file    FAMILY HISTORY: Family History  Problem Relation Age of Onset  . Diabetes Mother 66       DM  . Coronary artery disease Mother   . Hypertension Mother   . Heart attack Mother        CABG with MI in 2005  . Heart disease Mother        CV  . Hypertension Father   . Heart attack Father        CABG with Mi around 1997  . Coronary artery disease Father   . Heart disease Father        CV  . Diabetes Father   . Heart attack Brother        MI/PTCA  . Heart disease Brother        CV  . Diabetes Brother   . Heart attack Maternal Grandfather        MI  . Prostate cancer Paternal Grandfather   . Diabetes Sister        DM  . Breast cancer Neg Hx        Breast/ovarian/uterine cancer  . Colon cancer Neg Hx   . Depression Neg Hx   . Alcohol abuse Neg Hx        ETOH/drug abuse  . Stroke Neg Hx     ALLERGIES:  is allergic to coreg [carvedilol]; metformin and related; protonix [pantoprazole sodium]; rosuvastatin calcium; and statins.  MEDICATIONS:  Current Outpatient Medications  Medication Sig Dispense Refill  . aspirin EC 81 MG tablet Take 1 tablet (81 mg total) by mouth daily. 90 tablet 3  . bicalutamide (CASODEX) 50 MG tablet Take 1 tablet (50 mg total) by mouth daily. 90 tablet 2  . canagliflozin (INVOKANA) 300 MG TABS tablet TAKE 1 TABLET BY MOUTH ONCE DAILY WITH BREAKFAST 90 tablet 3  . carvedilol (COREG) 3.125 MG tablet Take 1 tablet (3.125 mg total) by mouth 2 (two) times a day. 60 tablet 8  . clopidogrel (PLAVIX) 75 MG tablet TAKE 1 TABLET BY MOUTH ONCE DAILY 30 tablet 0  . Dulaglutide (TRULICITY) 3 0000000 SOPN Inject 3 mg into the skin once a week. 12 pen 3  . ezetimibe (ZETIA) 10 MG tablet TAKE 1 TABLET BY MOUTH ONCE DAILY 30 tablet 5  . glucose blood (ONETOUCH ULTRA) test strip CHECK  BLOOD SUGAR TWICE A DAY - DX E11.59 200 strip 3  . Injection  Device (CORNWALL METAL PIPETTING) MISC Frequency:PHARMDIR   Dosage:0.0     Instructions:  Note:diagnosis 250.02 Dose: NA    . Insulin Degludec (TRESIBA FLEXTOUCH) 200 UNIT/ML SOPN Inject 44 Units into the skin daily. 12 pen 3  . Insulin Pen Needle 32G X 4 MM MISC Use 1x a day 100 each 3  . leuprolide (LUPRON) 30 MG injection Inject 45 mg into the muscle every 6 (six) months.    Marland Kitchen losartan (COZAAR) 25 MG tablet TAKE 1/2 TABLET BY MOUTH ONCE A DAY 15 tablet 5  . meclizine (ANTIVERT) 25 MG tablet Take 0.5-1 tablets (12.5-25 mg total) by mouth 2 (two) times daily as needed for dizziness or nausea. 30 tablet 0  . REPATHA SURECLICK [id number] MG/ML SOAJ INHECT 1 PEN INTO THE SKIN EVERY 14 DAYS 2 pen 11  . NITROSTAT 0.4 MG SL tablet Place 1 tablet (0.4 mg total) under the tongue as needed. (Patient not taking: Reported on 09/28/2019) 25 tablet 3   No current facility-administered medications for this visit.      Marland Kitchen  PHYSICAL EXAMINATION: ECOG PERFORMANCE STATUS: 0 - Asymptomatic  Vitals:   09/29/19 0841  BP: 130/79  Pulse: 69  Resp: 18  Temp: (!) 96.2 F (35.7 C)  SpO2: 100%   Filed Weights   09/29/19 0841  Weight: 245 lb 12.8 oz (111.5 kg)    Physical Exam  Constitutional: He is oriented to person, place, and time and well-developed, well-nourished, and in no distress.  HENT:  Head: Normocephalic and atraumatic.  Mouth/Throat: Oropharynx is clear and moist. No oropharyngeal exudate.  Eyes: Pupils are equal, round, and reactive to light.  Cardiovascular: Normal rate and regular rhythm.  Pulmonary/Chest: Effort normal and breath sounds normal. No respiratory distress. He has no wheezes.  Abdominal: Soft. Bowel sounds are normal. He exhibits no distension and no mass. There is no abdominal tenderness. There is no rebound and no guarding.  Musculoskeletal:        General: No tenderness or edema. Normal range of motion.     Cervical  back: Normal range of motion and neck supple.  Neurological: He is alert and oriented to person, place, and time.  Skin: Skin is warm.  Psychiatric: Affect normal.    LABORATORY DATA:  I have reviewed the data as listed Lab Results  Component Value Date   WBC 5.9 11/28/2018   HGB 15.4 11/28/2018   HCT 43.9 11/28/2018   MCV 85.5 11/28/2018   PLT 212.0 11/28/2018   Recent Labs    11/28/18 0859  NA 139  K 4.3  CL 104  CO2 28  GLUCOSE 153*  BUN 21  CREATININE 0.97  CALCIUM 9.0  PROT 6.7  ALBUMIN 4.2  AST 12  ALT 13  ALKPHOS 60  BILITOT 0.9    RADIOGRAPHIC STUDIES: I have personally reviewed the radiological images as listed and agreed with the findings in the report. NM PET (AXUMIN) SKULL BASE TO MID THIGH  Result Date: 09/24/2019 CLINICAL DATA:  Prostate carcinoma with biochemical recurrence. EXAM: NUCLEAR MEDICINE PET SKULL BASE TO THIGH TECHNIQUE: 10.74 mCi F-18 Fluciclovine was injected intravenously. Full-ring PET imaging was performed from the skull base to thigh after the radiotracer. CT data was obtained and used for attenuation correction and anatomic localization. COMPARISON:  05/25/2010 FINDINGS: NECK No radiotracer activity in neck lymph nodes. Incidental CT finding: None CHEST No radiotracer accumulation within mediastinal or hilar lymph nodes. No suspicious pulmonary nodules on the CT scan. Incidental CT finding: Aortic  atherosclerosis.  CABG. ABDOMEN/PELVIS Prostate: There is residual tracer avid soft tissue within the prostate bed measuring 3 x 2.2 cm with SUV max of 6.29. Lymph nodes: No tracer avid lymph nodes identified within the abdomen or pelvis. Liver: No evidence of liver metastasis Incidental CT finding: Aortic atherosclerosis. No aneurysm. Left kidney cyst. SKELETON Lumbar spondylosis identified.  No abnormal tracer uptake. IMPRESSION: 1. Tracer avid soft tissue is identified within the prostate bed which may represent residual prostate tissue and/or  recurrent prostate cancer. 2. No findings of tracer avid nodal metastasis or solid organ metastasis. No abnormal uptake identified within the visualized osseous structures. 3.  Aortic Atherosclerosis (ICD10-I70.0). Electronically Signed   By: Kerby Moors M.D.   On: 09/24/2019 16:33    ASSESSMENT & PLAN:   Prostate cancer ([place]) # STAGE-IV-Prostate cancer-metastatic to bone/castrate resistant- [PET scan 2011]. On Lupron/Casodex. PSA jumped from 0.3 to 0.9 [after taking off Casodex].  PET scan February 2021-shows no uptake in the bones; shows uptake in the prostate bed.  #I have left a message to discuss local therapy options with Dr.Stoioff-possibly radiation vs. less likely surgery.  Also discussed in the team conference.  #Given the absence of metastatic disease to the bone; rising PSA on Lupron -castrate resistant I think it is reasonable to reevaluate for apalutamide/ darolutamide after finishing local therapy to the prostate.  #Prostate cancer-at age of 55-genetic counseling.  # DISPOSITION: # # genetic counselling referral: Dx; Prostate cancer # follow up TBD- Dr.B  # I reviewed the blood work- with the patient in detail; also reviewed the imaging independently [as summarized above]; and with the patient in detail.    All questions were answered. The patient knows to call the clinic with any problems, questions or concerns.    Cammie Sickle, MD 09/29/2019 12:22 PM

## 2019-09-29 NOTE — Assessment & Plan Note (Addendum)
#   STAGE-IV-Prostate cancer-metastatic to bone/castrate resistant- [PET scan 2011]. On Lupron/Casodex. PSA jumped from 0.3 to 0.9 [after taking off Casodex].  PET scan February 2021-shows no uptake in the bones; shows uptake in the prostate bed.  #I have left a message to discuss local therapy options with Dr.Stoioff-possibly radiation vs. less likely surgery.  Also discussed in the team conference.  #Given the absence of metastatic disease to the bone; rising PSA on Lupron -castrate resistant I think it is reasonable to reevaluate for apalutamide/ darolutamide after finishing local therapy to the prostate.  #Prostate cancer-at age of 55-genetic counseling.  # DISPOSITION: # # genetic counselling referral: Dx; Prostate cancer # follow up TBD- Dr.B  # I reviewed the blood work- with the patient in detail; also reviewed the imaging independently [as summarized above]; and with the patient in detail.

## 2019-09-30 ENCOUNTER — Encounter: Payer: Self-pay | Admitting: Family

## 2019-09-30 ENCOUNTER — Ambulatory Visit (INDEPENDENT_AMBULATORY_CARE_PROVIDER_SITE_OTHER): Payer: Medicare Other | Admitting: Family

## 2019-09-30 VITALS — BP 122/72 | HR 73 | Ht 69.0 in | Wt 244.0 lb

## 2019-09-30 DIAGNOSIS — E1136 Type 2 diabetes mellitus with diabetic cataract: Secondary | ICD-10-CM | POA: Diagnosis not present

## 2019-09-30 DIAGNOSIS — I739 Peripheral vascular disease, unspecified: Secondary | ICD-10-CM

## 2019-09-30 DIAGNOSIS — E785 Hyperlipidemia, unspecified: Secondary | ICD-10-CM | POA: Diagnosis not present

## 2019-09-30 DIAGNOSIS — Z794 Long term (current) use of insulin: Secondary | ICD-10-CM | POA: Diagnosis not present

## 2019-09-30 DIAGNOSIS — I10 Essential (primary) hypertension: Secondary | ICD-10-CM

## 2019-09-30 DIAGNOSIS — I2510 Atherosclerotic heart disease of native coronary artery without angina pectoris: Secondary | ICD-10-CM | POA: Diagnosis not present

## 2019-09-30 DIAGNOSIS — E119 Type 2 diabetes mellitus without complications: Secondary | ICD-10-CM | POA: Diagnosis not present

## 2019-09-30 DIAGNOSIS — C7951 Secondary malignant neoplasm of bone: Secondary | ICD-10-CM | POA: Diagnosis not present

## 2019-09-30 DIAGNOSIS — I340 Nonrheumatic mitral (valve) insufficiency: Secondary | ICD-10-CM | POA: Diagnosis not present

## 2019-09-30 DIAGNOSIS — C61 Malignant neoplasm of prostate: Secondary | ICD-10-CM | POA: Diagnosis not present

## 2019-09-30 MED ORDER — NITROGLYCERIN 0.4 MG SL SUBL: 0.4000 mg | SUBLINGUAL_TABLET | SUBLINGUAL | 3 refills | Status: DC | PRN

## 2019-09-30 NOTE — Progress Notes (Signed)
Office Visit    Patient Name: Phillip Clements Date of Encounter: 09/30/2019  Primary Care Provider:  Tonia Ghent, MD Primary Cardiologist:  [name], MD Electrophysiologist:  None   Chief Complaint    Phillip Clements is a 69 y.o. male with a hx of CAD s/p CABG, PAD s/p left SFA angioplasty, HTN, HLD, CVA, DM2, obesity, mitral regurgitation, OSA, prostate cancer presents today for CAD follow up.    Past Medical History    Past Medical History:  Diagnosis Date  . Cataract    bilateral eye in 2010  . Coronary artery disease    a. CABG 04/2014: (LIMA->LAD, VG->DIAG, VG->OM2, VG->PDA)  b. 07/2014 early graft failure (VG->OM2 60, LIMA->LAD atretic, VG->PDA & VG->D1 100). s/p successful rotational atherectomy & DES of the LM & pLAD (3.0x24 Promus DES) & mLAD (2.25x24 Promus DES).  . CVA (cerebral infarction)   . Hx of adenomatous colonic polyps 06/17/2017  . Hyperlipidemia    a. Statin intolerant.  . Hypertension 2000  . Moderate mitral regurgitation    a. 02/2015 Echo: EF 55-60%, no rwma, mod MR, mod BAE, mildly dil RV w/ low nl RV fxn.  . OSA on CPAP   . Peripheral vascular disease (Phillip Clements)    a. left SFA angioplasty in December 2016  . Prostate cancer (Phillip Clements)   . Sleep apnea    wears CPAP nightly  . Type II diabetes mellitus (Phillip Clements)    Past Surgical History:  Procedure Laterality Date  . ANTERIOR CERVICAL DECOMP/DISCECTOMY FUSION  08/25/2012   Procedure: ANTERIOR CERVICAL DECOMPRESSION/DISCECTOMY FUSION 2 LEVELS;  Surgeon: Phillip Spangle, MD;  Location: [facility];  Service: Neurosurgery;  Laterality: N/A;  Cervical five-six Cervical six-seven anterior cervical decompression with fusion and plating and bonegraft  . BACK SURGERY    . CARDIAC CATHETERIZATION  04/2014; 07/19/2014  . CORONARY ARTERY BYPASS GRAFT N/A 04/26/2014   Procedure: CORONARY ARTERY BYPASS GRAFTING (CABG), on pump, times four, using left internal mammary artery, right greater saphenous vein harvested  endoscopically.;  Surgeon: Phillip Poot, MD;  Location: [place];  Service: Open Heart Surgery;  Laterality: N/A;  LIMA to LAD, SVG to DIAGONAL, SVG to OM1, SVG to PDA) with EVH of the RIGHT THIGH and LOWER EXTREMITY SAPHENOUS VEIN  . Cytoscopy prostatic stone o/w nml  06/21/08   Dr. Jacqlyn Clements  . ETT myoview  09/13/09   Low risk EF 49%  . High intense focused ultrasound  07/20/06   By Dr. Jacqlyn Clements  . INTRAOPERATIVE TRANSESOPHAGEAL ECHOCARDIOGRAM N/A 04/26/2014   Procedure: INTRAOPERATIVE TRANSESOPHAGEAL ECHOCARDIOGRAM;  Surgeon: Phillip Poot, MD;  Location: [place];  Service: Open Heart Surgery;  Laterality: N/A;  . PERCUTANEOUS CORONARY ROTOBLATOR INTERVENTION (PCI-R) N/A 07/22/2014   Procedure: PERCUTANEOUS CORONARY ROTOBLATOR INTERVENTION (PCI-R);  Surgeon: Phillip M Martinique, MD;  Location: [facility];  Service: Cardiovascular;  Laterality: N/A;  . PERIPHERAL VASCULAR CATHETERIZATION Left 07/26/2015   Procedure: Lower Extremity Angiography;  Surgeon: Phillip Cabal, MD;  Location: [place] CV [facility];  Service: Cardiovascular;  Laterality: Left;  . PERIPHERAL VASCULAR CATHETERIZATION  07/26/2015   Procedure: Lower Extremity Intervention;  Surgeon: Phillip Cabal, MD;  Location: [place] [facility];  Service: Cardiovascular;;  . PROSTATE BIOPSY  04/03/06 & 02/25/07    Allergies  Allergies  Allergen Reactions  . Coreg [Carvedilol] Other (See Comments)    Fatigue- unclear if this was due to coreg specifically or beta blockers in general.    .  Metformin And Related Nausea Only  . Protonix [Pantoprazole Sodium] Diarrhea  . Rosuvastatin Calcium     REACTION: JOINT ACHES  . Statins     REACTION: JOINT ACHES    History of Present Illness    Phillip Clements is a 69 y.o. male with a hx of CAD s/p CABG, PAD s/p left SFA angioplasty (07/2015), HTN, HLD, CVA, DM2, obesity, mitral regurgitation, OSA, prostate cancer. He was last seen 12/2018 via telemedicine by Phillip Bayley, NP.  CAD s/p CABGx4 2015.  Repeat cath with 6% stenosis in vein graft to obtuse marginal with occluded SVG to diagonal and PDA as well as atretic LIMA to LAD. Underwent atherectomy and DESx2 to LM and proximal/mid LAD.  Follows with Dr. Rogue Clements of oncology for prostate caner. PET scan February with no uptake in bones but uptake in prostate beds. Awaiting decision of further radiation.   Follow with Dr. Cruzita Clements of endocrinology. Reports blood sugars have been elevated recently.   He enjoys staying busy by working on his bike 20 at home.  He and his wife also recently purchased land and are [place].  Reports recently occasional chest pain in his right chest that he attributes to a pulled muscle.  This is not similar to his previous anginal equivalent.  Reports no shortness of breath at rest.  Reports no dyspnea with walking around the house for his various projects but does note some dyspnea only with stairs.  He attributes this to "getting old".  No formal exercise regimen but does stay very active with various projects and lifting.  Endorses eating a heart healthy diet.  Just retired in January and is enjoying having extra time to complete these projects. EKGs/Labs/Other Studies Reviewed:   The following studies were reviewed today:  EKG:  EKG is ordered today.  The ekg ordered today demonstrates SR 73 bpm with no acute ST/T wave changes and stable inferior lead changes with TWI  AVR and T wave abnormality aVL.   Recent Labs: 11/28/2018: ALT 13; BUN 21; Creatinine, Ser 0.97; Hemoglobin 15.4; Platelets 212.0; Potassium 4.3; Sodium 139  Recent Lipid Panel    Component Value Date/Time   CHOL 156 11/28/2018 0859   CHOL 97 (L) 09/24/2016 0855   TRIG 119.0 11/28/2018 0859   HDL 40.60 11/28/2018 0859   HDL 38 (L) 09/24/2016 0855   CHOLHDL 4 11/28/2018 0859   VLDL 23.8 11/28/2018 0859   LDLCALC 92 11/28/2018 0859   LDLCALC 37 09/24/2016 0855   LDLDIRECT 157.1 09/07/2013 0910     Home Medications   Current Meds  Medication Sig  . aspirin EC 81 MG tablet Take 1 tablet (81 mg total) by mouth daily.  . canagliflozin (INVOKANA) 300 MG TABS tablet TAKE 1 TABLET BY MOUTH ONCE DAILY WITH BREAKFAST  . carvedilol (COREG) 3.125 MG tablet Take 1 tablet (3.125 mg total) by mouth 2 (two) times a day.  . clopidogrel (PLAVIX) 75 MG tablet TAKE 1 TABLET BY MOUTH ONCE DAILY  . Dulaglutide (TRULICITY) 3 0000000 SOPN Inject 3 mg into the skin once a week.  . ezetimibe (ZETIA) 10 MG tablet TAKE 1 TABLET BY MOUTH ONCE DAILY  . glucose blood (ONETOUCH ULTRA) test strip CHECK BLOOD SUGAR TWICE A DAY - DX E11.59  . Injection Device (CORNWALL METAL PIPETTING) MISC Frequency:PHARMDIR   Dosage:0.0     Instructions:  Note:diagnosis 250.02 Dose: NA  . Insulin Degludec (TRESIBA FLEXTOUCH) 200 UNIT/ML SOPN Inject 44 Units  into the skin daily.  . Insulin Pen Needle 32G X 4 MM MISC Use 1x a day  . leuprolide (LUPRON) 30 MG injection Inject 45 mg into the muscle every 6 (six) months.  Marland Kitchen losartan (COZAAR) 25 MG tablet TAKE 1/2 TABLET BY MOUTH ONCE A DAY  . meclizine (ANTIVERT) 25 MG tablet Take 0.5-1 tablets (12.5-25 mg total) by mouth 2 (two) times daily as needed for dizziness or nausea.  Marland Kitchen NITROSTAT 0.4 MG SL tablet Place 1 tablet (0.4 mg total) under the tongue as needed.  Marland Kitchen REPATHA SURECLICK [id number] MG/ML SOAJ INHECT 1 PEN INTO THE SKIN EVERY 14 DAYS    Review of Systems      Review of Systems  Constitution: Negative for chills, fever and malaise/fatigue.  Cardiovascular: Positive for dyspnea on exertion. Negative for chest pain, leg swelling, near-syncope, orthopnea, palpitations and syncope.  Respiratory: Negative for cough, shortness of breath and wheezing.   Gastrointestinal: Negative for nausea and vomiting.  Neurological: Negative for dizziness, light-headedness and weakness.   All other systems reviewed and are otherwise negative except as noted above.  Physical Exam    VS:  BP  122/72 (BP Location: Left Arm, Patient Position: Sitting, Cuff Size: Normal)   Pulse 73   Ht 5\' 9"  (1.753 m)   Wt 244 lb (110.7 kg)   SpO2 98%   BMI 36.03 kg/m  , BMI Body mass index is 36.03 kg/m. GEN: Well nourished, overweight, well developed, in no acute distress. HEENT: normal. Neck: Supple, no JVD, carotid bruits, or masses. Cardiac: RRR, no murmurs, rubs, or gallops. No clubbing, cyanosis, edema.  Radials/DP/PT 2+ and equal bilaterally.  Respiratory:  Respirations regular and unlabored, clear to auscultation bilaterally. GI: Soft, nontender, nondistended, BS + x 4. MS: No deformity or atrophy. Skin: Warm and dry, no rash. Neuro:  Strength and sensation are intact. Psych: Normal affect.  Assessment & Plan    1. CAD/DOE - S/p CABG x4 with repeated PCI/DES. Symptoms are stable. No chest pain concerning for angina. DOE only with stairs, but not with other exertional activity. Likely etiology deconditioning. EKG today without acute changes. No indication for ischemic evaluation at this time. GDMT includes aspirin, plavix, statin, beta blocker. Refill of nitroglycerin provided as his had expired.   2. HTN - BP well controlled. Continue present regimen of Coreg 3.125mg  BID, Losartan 12.5mg  daily.  3. HLD - Statin intolerant. Continue Repatha and Zetia. Endorses compliance. LDL goal <70. He will return for fasting lipid panel. If LDL >70 may benefit from referral to lipid clinic. Dietary and lifestyle changes discussed today.  4. DM2 - Follows with endocrinology. If additional agent needed, consider SGLT2i for cardioprotective benefit  5. Obesity - Weight loss via healthy diet and regular exercise encouraged.  6. OSA - Encouraged CPAP use.  7. PAD - Stable claudication symptoms. No indication for repeat imaging. Continue aspirin, plavix, repatha, statin.  Disposition: Will return for fasting lipid profile and CMET.   Follow up in 6 month(s) with Dr. Tor Netters,  NP 09/30/2019, 2:37 PM

## 2019-09-30 NOTE — Patient Instructions (Addendum)
Medication Instructions:  No medication changes.  A refill of your Nitroglycerin was sent today.  *If you need a refill on your cardiac medications before your next appointment, please call your pharmacy*  Lab Work: Your physician recommends that you return for lab work within the next month for lipid panel, CMET. Please be fasting for those labs. Simply stop at the [facility] one morning. No appt is needed. Hours are M-F 7AM- 6 PM.   If you have labs (blood work) drawn today and your tests are completely normal, you will receive your results only by: Marland Kitchen MyChart Message (if you have MyChart) OR . A paper copy in the mail If you have any lab test that is abnormal or we need to change your treatment, we will call you to review the results.  Testing/Procedures: You had an EKG today that showed sinus rhythm and was stable compared to previous.  Follow-Up: At [facility], you and your health needs are our priority.  As part of our continuing mission to provide you with exceptional heart care, we have created designated Provider Care Teams.  These Care Teams include your primary Cardiologist (physician) and Advanced Practice Providers (APPs -  Physician Assistants and Nurse Practitioners) who all work together to provide you with the care you need, when you need it.  Your next appointment:   6 month(s)  The format for your next appointment:   In Person  Provider:    You may see [name], MD or one of the following Advanced Practice Providers on your designated Care Team:    Murray Hodgkins, NP  Christell Faith, PA-C  Marrianne Mood, PA-C  Other Instructions  Fat and Cholesterol Restricted Eating Plan Getting too much fat and cholesterol in your diet may cause health problems. Choosing the right foods helps keep your fat and cholesterol at normal levels. This can keep you from getting certain diseases.  What are tips for following this plan? Meal planning  At meals,  divide your plate into four equal parts: ? Fill one-half of your plate with vegetables and green salads. ? Fill one-fourth of your plate with whole grains. ? Fill one-fourth of your plate with low-fat (lean) protein foods.  Eat fish that is high in omega-3 fats at least two times a week. This includes mackerel, tuna, sardines, and salmon.  Eat foods that are high in fiber, such as whole grains, beans, apples, broccoli, carrots, peas, and barley. General tips   Work with your doctor to lose weight if you need to.  Avoid: ? Foods with added sugar. ? Fried foods. ? Foods with partially hydrogenated oils.  Limit alcohol intake to no more than 1 drink a day for nonpregnant women and 2 drinks a day for men. One drink equals 12 oz of beer, 5 oz of wine, or 1 oz of hard liquor. Reading food labels  Check food labels for: ? Trans fats. ? Partially hydrogenated oils. ? Saturated fat (g) in each serving. ? Cholesterol (mg) in each serving. ? Fiber (g) in each serving.  Choose foods with healthy fats, such as: ? Monounsaturated fats. ? Polyunsaturated fats. ? Omega-3 fats.  Choose grain products that have whole grains. Look for the word "whole" as the first word in the ingredient list. Cooking  Cook foods using low-fat methods. These include baking, boiling, grilling, and broiling.  Eat more home-cooked foods. Eat at restaurants and buffets less often.  Avoid cooking using saturated fats, such as butter, cream, palm  oil, palm kernel oil, and coconut oil. Recommended foods  Fruits  All fresh, canned (in natural juice), or frozen fruits. Vegetables  Fresh or frozen vegetables (raw, steamed, roasted, or grilled). Green salads. Grains  Whole grains, such as whole wheat or whole grain breads, crackers, cereals, and pasta. Unsweetened oatmeal, bulgur, barley, quinoa, or brown rice. Corn or whole wheat flour tortillas. Meats and other protein foods  Ground beef (85% or leaner),  grass-fed beef, or beef trimmed of fat. Skinless chicken or [place]. Ground chicken or [place]. Pork trimmed of fat. All fish and seafood. Egg whites. Dried beans, peas, or lentils. Unsalted nuts or seeds. Unsalted canned beans. Nut butters without added sugar or oil. Dairy  Low-fat or nonfat dairy products, such as skim or 1% milk, 2% or reduced-fat cheeses, low-fat and fat-free ricotta or cottage cheese, or plain low-fat and nonfat yogurt. Fats and oils  Tub margarine without trans fats. Light or reduced-fat mayonnaise and salad dressings. Avocado. Olive, canola, sesame, or safflower oils. The items listed above may not be a complete list of foods and beverages you can eat. Contact a dietitian for more information. Foods to avoid Fruits  Canned fruit in heavy syrup. Fruit in cream or butter sauce. Fried fruit. Vegetables  Vegetables cooked in cheese, cream, or butter sauce. Fried vegetables. Grains  White bread. White pasta. White rice. Cornbread. Bagels, pastries, and croissants. Crackers and snack foods that contain trans fat and hydrogenated oils. Meats and other protein foods  Fatty cuts of meat. Ribs, chicken wings, bacon, sausage, bologna, salami, chitterlings, fatback, hot dogs, bratwurst, and packaged lunch meats. Liver and organ meats. Whole eggs and egg yolks. Chicken and [place] with skin. Fried meat. Dairy  Whole or 2% milk, cream, half-and-half, and cream cheese. Whole milk cheeses. Whole-fat or sweetened yogurt. Full-fat cheeses. Nondairy creamers and whipped toppings. Processed cheese, cheese spreads, and cheese curds. Beverages  Alcohol. Sugar-sweetened drinks such as sodas, lemonade, and fruit drinks. Fats and oils  Butter, stick margarine, lard, shortening, ghee, or bacon fat. Coconut, palm kernel, and palm oils. Sweets and desserts  Corn syrup, sugars, honey, and molasses. Candy. Jam and jelly. Syrup. Sweetened cereals. Cookies, pies, cakes, donuts, muffins, and ice  cream. The items listed above may not be a complete list of foods and beverages you should avoid. Contact a dietitian for more information. Summary  Choosing the right foods helps keep your fat and cholesterol at normal levels. This can keep you from getting certain diseases.  At meals, fill one-half of your plate with vegetables and green salads.  Eat high-fiber foods, like whole grains, beans, apples, carrots, peas, and barley.  Limit added sugar, saturated fats, alcohol, and fried foods. This information is not intended to replace advice given to you by your health care provider. Make sure you discuss any questions you have with your health care provider. Document Revised: 04/02/2018 Document Reviewed: 04/16/2017 Elsevier Patient Education  [place].

## 2019-10-01 ENCOUNTER — Other Ambulatory Visit: Payer: Medicare Other

## 2019-10-01 NOTE — Progress Notes (Signed)
Tumor Board Documentation  Phillip Clements was presented by Dr Rogue Bussing at our Tumor Board on 10/01/2019, which included representatives from medical oncology, radiation oncology, navigation, pathology, radiology, surgical, internal medicine, genetics, pulmonology, palliative care, research.  Phillip Clements currently presents as a new patient, for discussion with history of the following treatments: active survellience, hormonal therapy(Lupron and Casodex).  Additionally, we reviewed previous medical and familial history, history of present illness, and recent lab results along with all available histopathologic and imaging studies. The tumor board considered available treatment options and made the following recommendations: Radiation therapy (primary modality)    The following procedures/referrals were also placed: No orders of the defined types were placed in this encounter.   Clinical Trial Status: not discussed   Staging used: Pathologic Stage  AJCC Staging:       Group: Stage 4 Prostate Cancer   National site-specific guidelines   were discussed with respect to the case.  Tumor board is a meeting of clinicians from various specialty areas who evaluate and discuss patients for whom a multidisciplinary approach is being considered. Final determinations in the plan of care are those of the provider(s). The responsibility for follow up of recommendations given during tumor board is that of the provider.   Today's extended care, comprehensive team conference, Phillip Clements was not present for the discussion and was not examined.   Multidisciplinary Tumor Board is a multidisciplinary case peer review process.  Decisions discussed in the Multidisciplinary Tumor Board reflect the opinions of the specialists present at the conference without having examined the patient.  Ultimately, treatment and diagnostic decisions rest with the primary provider(s) and the patient.

## 2019-10-06 ENCOUNTER — Telehealth: Payer: Self-pay | Admitting: Internal Medicine

## 2019-10-06 NOTE — Telephone Encounter (Signed)
On 2/22-left a message for patient regarding the discussion at tumor conference recommendation/discussion with Dr. Bernardo Heater for radiation.   Please make a referral to Dr. Donella Stade regarding prostate cancer  Follow-up with me in first week of May; no labs.

## 2019-10-09 ENCOUNTER — Encounter: Payer: Self-pay | Admitting: Family

## 2019-10-09 ENCOUNTER — Other Ambulatory Visit
Admission: RE | Admit: 2019-10-09 | Discharge: 2019-10-09 | Disposition: A | Discharge status: Home / Self Care | Payer: Medicare Other | Source: Ambulatory Visit | Attending: Family | Admitting: Family

## 2019-10-09 ENCOUNTER — Telehealth: Payer: Self-pay | Admitting: Family

## 2019-10-09 DIAGNOSIS — I10 Essential (primary) hypertension: Secondary | ICD-10-CM

## 2019-10-09 DIAGNOSIS — I2510 Atherosclerotic heart disease of native coronary artery without angina pectoris: Secondary | ICD-10-CM | POA: Diagnosis not present

## 2019-10-09 LAB — COMPREHENSIVE METABOLIC PANEL
ALT: 20 U/L (ref 0–44)
AST: 12 U/L — ABNORMAL LOW (ref 15–41)
Albumin: 4 g/dL (ref 3.5–5.0)
Alkaline Phosphatase: 63 U/L (ref 38–126)
Anion gap: 8 (ref 5–15)
BUN: 23 mg/dL (ref 8–23)
CO2: 28 mmol/L (ref 22–32)
Calcium: 8.7 mg/dL — ABNORMAL LOW (ref 8.9–10.3)
Chloride: 103 mmol/L (ref 98–111)
Creatinine, Ser: 0.91 mg/dL (ref 0.61–1.24)
GFR calc Af Amer: >60 mL/min (ref >60)
GFR calc non Af Amer: >60 mL/min (ref >60)
Glucose, Bld: 201 mg/dL — ABNORMAL HIGH (ref 70–99)
Potassium: 4.2 mmol/L (ref 3.5–5.1)
Sodium: 139 mmol/L (ref 135–145)
Total Bilirubin: 0.9 mg/dL (ref 0.3–1.2)
Total Protein: 6.8 g/dL (ref 6.5–8.1)

## 2019-10-09 LAB — LIPID PANEL
Cholesterol: 166 mg/dL (ref 0–200)
HDL: 42 mg/dL (ref >40)
LDL Cholesterol: 96 mg/dL (ref 0–99)
Total CHOL/HDL Ratio: 4 RATIO
Triglycerides: 138 mg/dL (ref <150)
VLDL: 28 mg/dL (ref 0–40)

## 2019-10-09 NOTE — Telephone Encounter (Signed)
I spoke with the patient regarding his labs and he voices understanding of her results. He is also aware of recommendations from Laurann Montana, NP.  The patient confirms he will miss doses of his Repatha at times due to the fact he forgets to refill his RX. I have advised the patient on the importance of compliance and asked that he mark dates on his calendar when he is scheduled to take a dose to help with this.   The patient states he does have access to his MyChart and is aware Urban Gibson has sent him information on a lipid lowering diet.   The patient voices understanding of the above and is agreeable.

## 2019-10-09 NOTE — Telephone Encounter (Signed)
Loel Dubonnet, NP  10/09/2019 11:37 AM EST    LDL or "bad cholesterol" is 96 and we would like for it to be less than 70. Triglycerides and HDL are at goal. Please confirm he is taking his Repatha routinely every 2 weeks. If not, very important he takes his Repatha regularly. If he is taking, recommend referral to lipid clinic.   Normal electrolytes with exception of marginally low calcium, not of concern. Glucose elevated at 201, continue to monitor diet and take diabetes medications.  Liver function normal. Kidney function normal.   I have sent information on lipid lowering diet to his MyChart.

## 2019-10-11 DIAGNOSIS — Z23 Encounter for immunization: Secondary | ICD-10-CM | POA: Diagnosis not present

## 2019-10-13 ENCOUNTER — Other Ambulatory Visit: Payer: Self-pay

## 2019-10-13 ENCOUNTER — Ambulatory Visit: Payer: Medicare Other | Admitting: Internal Medicine

## 2019-10-14 ENCOUNTER — Ambulatory Visit
Admission: RE | Admit: 2019-10-14 | Discharge: 2019-10-14 | Disposition: A | Discharge status: Home / Self Care | Payer: Medicare Other | Source: Ambulatory Visit | Attending: Radiation Oncology | Admitting: Radiation Oncology

## 2019-10-14 ENCOUNTER — Other Ambulatory Visit: Payer: Self-pay

## 2019-10-14 ENCOUNTER — Encounter: Payer: Self-pay | Admitting: Radiation Oncology

## 2019-10-14 ENCOUNTER — Telehealth: Payer: Self-pay | Admitting: Internal Medicine

## 2019-10-14 VITALS — BP 131/68 | HR 73 | Temp 96.6°F | Resp 20 | Wt 244.0 lb

## 2019-10-14 DIAGNOSIS — Z86010 Personal history of colonic polyps: Secondary | ICD-10-CM | POA: Diagnosis not present

## 2019-10-14 DIAGNOSIS — Z7982 Long term (current) use of aspirin: Secondary | ICD-10-CM | POA: Diagnosis not present

## 2019-10-14 DIAGNOSIS — I10 Essential (primary) hypertension: Secondary | ICD-10-CM | POA: Insufficient documentation

## 2019-10-14 DIAGNOSIS — C61 Malignant neoplasm of prostate: Secondary | ICD-10-CM | POA: Diagnosis not present

## 2019-10-14 DIAGNOSIS — Z87891 Personal history of nicotine dependence: Secondary | ICD-10-CM | POA: Diagnosis not present

## 2019-10-14 DIAGNOSIS — I2510 Atherosclerotic heart disease of native coronary artery without angina pectoris: Secondary | ICD-10-CM | POA: Diagnosis not present

## 2019-10-14 DIAGNOSIS — Z79899 Other long term (current) drug therapy: Secondary | ICD-10-CM | POA: Insufficient documentation

## 2019-10-14 DIAGNOSIS — I340 Nonrheumatic mitral (valve) insufficiency: Secondary | ICD-10-CM | POA: Diagnosis not present

## 2019-10-14 DIAGNOSIS — E119 Type 2 diabetes mellitus without complications: Secondary | ICD-10-CM | POA: Insufficient documentation

## 2019-10-14 DIAGNOSIS — E785 Hyperlipidemia, unspecified: Secondary | ICD-10-CM | POA: Insufficient documentation

## 2019-10-14 DIAGNOSIS — Z8673 Personal history of transient ischemic attack (TIA), and cerebral infarction without residual deficits: Secondary | ICD-10-CM | POA: Diagnosis not present

## 2019-10-14 NOTE — Consult Note (Signed)
NEW PATIENT EVALUATION  Name: Phillip Clements  MRN: [id number]  Date:   10/14/2019     DOB: 12-31-1950   This 69 y.o. male patient presents to the clinic for initial evaluation of prostate cancer status post HIFU as well as androgen deprivation therapy .  REFERRING PHYSICIAN: Tonia Ghent, MD  CHIEF COMPLAINT:  Chief Complaint  Patient presents with  . Prostate Cancer    DIAGNOSIS: The encounter diagnosis was Prostate cancer ([place]).   PREVIOUS INVESTIGATIONS:  Fluoride PET CT scan reviewed Clinical notes reviewed   HPI: Patient is an interesting 69 year old male whose history dates back to 2007 when he presented with a Gleason 6 adenocarcinoma in a PSA of 24.7.  [place] late [place] never had a significant decrease in his PSA.  In 2011 his PSA was 20.1 he was started on androgen deprivation therapy.  A fluoride PET CT scan in 2011 showed activity in femur humerus and ribs which may be arthritic in nature.  He was started on Lupron and Casodex.  His most recent PSA has been slightly increased to 0.9 he has no specific lower urinary tract symptoms or bone pain.  He recently underwent and Axumin PET CT scan specific for prostate cancer showing no evidence of bony metastasis.  No evidence of pelvic lymphadenopathy or increased uptake in that region.  He did have avid tracer in his prostate bed which may represent residual prostate cancer.  Patient had multiple nuclear medical scans back in 2011 all showing no evidence of metastatic disease.  He did have one scan shows diffuse metastatic disease which is puzzling.  He also had a 2018 lumbar spine MRI showing no evidence of metastatic disease.  He has been seen by medical oncology who favors local treatment to his prostate.  He is seen today for radiation oncology opinion.  Again he has no evidence of bone pain and very little lower urinary tract symptoms.  PLANNED TREATMENT REGIMEN: IMRT radiation therapy image  guided  PAST MEDICAL HISTORY:  has a past medical history of Cataract, Coronary artery disease, CVA (cerebral infarction), adenomatous colonic polyps (06/17/2017), Hyperlipidemia, Hypertension (2000), Moderate mitral regurgitation, OSA on CPAP, Peripheral vascular disease ([place]), Prostate cancer ([place]), Sleep apnea, and Type II diabetes mellitus ([place]).    PAST SURGICAL HISTORY:  Past Surgical History:  Procedure Laterality Date  . ANTERIOR CERVICAL DECOMP/DISCECTOMY FUSION  08/25/2012   Procedure: ANTERIOR CERVICAL DECOMPRESSION/DISCECTOMY FUSION 2 LEVELS;  Surgeon: Hosie Spangle, MD;  Location: [facility];  Service: Neurosurgery;  Laterality: N/A;  Cervical five-six Cervical six-seven anterior cervical decompression with fusion and plating and bonegraft  . BACK SURGERY    . CARDIAC CATHETERIZATION  04/2014; 07/19/2014  . CORONARY ARTERY BYPASS GRAFT N/A 04/26/2014   Procedure: CORONARY ARTERY BYPASS GRAFTING (CABG), on pump, times four, using left internal mammary artery, right greater saphenous vein harvested endoscopically.;  Surgeon: Ivin Poot, MD;  Location: [facility];  Service: Open Heart Surgery;  Laterality: N/A;  LIMA to LAD, SVG to DIAGONAL, SVG to OM1, SVG to PDA) with EVH of the RIGHT THIGH and LOWER EXTREMITY SAPHENOUS VEIN  . Cytoscopy prostatic stone o/w nml  06/21/08   Dr. Jacqlyn Larsen  . ETT myoview  09/13/09   Low risk EF 49%  . High intense focused ultrasound  07/20/06   By Dr. Jacqlyn Larsen  . INTRAOPERATIVE TRANSESOPHAGEAL ECHOCARDIOGRAM N/A 04/26/2014   Procedure: INTRAOPERATIVE TRANSESOPHAGEAL ECHOCARDIOGRAM;  Surgeon: Ivin Poot, MD;  Location:  [facility];  Service: Open Heart Surgery;  Laterality: N/A;  . PERCUTANEOUS CORONARY ROTOBLATOR INTERVENTION (PCI-R) N/A 07/22/2014   Procedure: PERCUTANEOUS CORONARY ROTOBLATOR INTERVENTION (PCI-R);  Surgeon: Peter M Martinique, MD;  Location: [facility] CATH LAB;  Service: Cardiovascular;  Laterality: N/A;  . PERIPHERAL VASCULAR CATHETERIZATION Left 07/26/2015    Procedure: Lower Extremity Angiography;  Surgeon: Katha Cabal, MD;  Location: [facility] CV LAB;  Service: Cardiovascular;  Laterality: Left;  . PERIPHERAL VASCULAR CATHETERIZATION  07/26/2015   Procedure: Lower Extremity Intervention;  Surgeon: Katha Cabal, MD;  Location: [facility] CV LAB;  Service: Cardiovascular;;  . PROSTATE BIOPSY  04/03/06 & 02/25/07    FAMILY HISTORY: family history includes Coronary artery disease in his father and mother; Diabetes in his brother, father, and sister; Diabetes (age of onset: 27) in his mother; Heart attack in his brother, father, maternal grandfather, and mother; Heart disease in his brother, father, and mother; Hypertension in his father and mother; Prostate cancer in his paternal grandfather.  SOCIAL HISTORY:  reports that he quit smoking about 5 years ago. His smoking use included cigarettes. He has a 36.00 pack-year smoking history. He has never used smokeless tobacco. He reports current alcohol use. He reports that he does not use drugs.  ALLERGIES: Coreg [carvedilol], Metformin and related, Protonix [pantoprazole sodium], Rosuvastatin calcium, and Statins  MEDICATIONS:  Current Outpatient Medications  Medication Sig Dispense Refill  . aspirin EC 81 MG tablet Take 1 tablet (81 mg total) by mouth daily. 90 tablet 3  . canagliflozin (INVOKANA) 300 MG TABS tablet TAKE 1 TABLET BY MOUTH ONCE DAILY WITH BREAKFAST 90 tablet 3  . carvedilol (COREG) 3.125 MG tablet Take 1 tablet (3.125 mg total) by mouth 2 (two) times a day. 60 tablet 8  . clopidogrel (PLAVIX) 75 MG tablet TAKE 1 TABLET BY MOUTH ONCE DAILY 30 tablet 0  . Dulaglutide (TRULICITY) 3 0000000 SOPN Inject 3 mg into the skin once a week. 12 pen 3  . ezetimibe (ZETIA) 10 MG tablet TAKE 1 TABLET BY MOUTH ONCE DAILY 30 tablet 5  . glucose blood (ONETOUCH ULTRA) test strip CHECK BLOOD SUGAR TWICE A DAY - DX E11.59 200 strip 3  . Injection Device (CORNWALL METAL PIPETTING) MISC  Frequency:PHARMDIR   Dosage:0.0     Instructions:  Note:diagnosis 250.02 Dose: NA    . Insulin Degludec (TRESIBA FLEXTOUCH) 200 UNIT/ML SOPN Inject 44 Units into the skin daily. 12 pen 3  . Insulin Pen Needle 32G X 4 MM MISC Use 1x a day 100 each 3  . leuprolide (LUPRON) 30 MG injection Inject 45 mg into the muscle every 6 (six) months.    Marland Kitchen losartan (COZAAR) 25 MG tablet TAKE 1/2 TABLET BY MOUTH ONCE A DAY 15 tablet 5  . meclizine (ANTIVERT) 25 MG tablet Take 0.5-1 tablets (12.5-25 mg total) by mouth 2 (two) times daily as needed for dizziness or nausea. 30 tablet 0  . nitroGLYCERIN (NITROSTAT) 0.4 MG SL tablet Place 1 tablet (0.4 mg total) under the tongue as needed. 25 tablet 3  . REPATHA SURECLICK [id number] MG/ML SOAJ INHECT 1 PEN INTO THE SKIN EVERY 14 DAYS 2 pen 11   No current facility-administered medications for this encounter.    ECOG PERFORMANCE STATUS:  0 - Asymptomatic  REVIEW OF SYSTEMS: Patient denies any weight loss, fatigue, weakness, fever, chills or night sweats. Patient denies any loss of vision, blurred vision. Patient denies any ringing  of the ears or hearing loss. No irregular  heartbeat. Patient denies heart murmur or history of fainting. Patient denies any chest pain or pain radiating to her upper extremities. Patient denies any shortness of breath, difficulty breathing at night, cough or hemoptysis. Patient denies any swelling in the lower legs. Patient denies any nausea vomiting, vomiting of blood, or coffee ground material in the vomitus. Patient denies any stomach pain. Patient states has had normal bowel movements no significant constipation or diarrhea. Patient denies any dysuria, hematuria or significant nocturia. Patient denies any problems walking, swelling in the joints or loss of balance. Patient denies any skin changes, loss of hair or loss of weight. Patient denies any excessive worrying or anxiety or significant depression. Patient denies any problems with insomnia.  Patient denies excessive thirst, polyuria, polydipsia. Patient denies any swollen glands, patient denies easy bruising or easy bleeding. Patient denies any recent infections, allergies or URI. Patient "s visual fields have not changed significantly in recent time.   PHYSICAL EXAM: BP 131/68   Pulse 73   Temp (!) 96.6 F (35.9 C) (Tympanic)   Resp 20   Wt 244 lb (110.7 kg)   BMI 36.03 kg/m  Well-developed well-nourished patient in NAD. HEENT reveals PERLA, EOMI, discs not visualized.  Oral cavity is clear. No oral mucosal lesions are identified. Neck is clear without evidence of cervical or supraclavicular adenopathy. Lungs are clear to A&P. Cardiac examination is essentially unremarkable with regular rate and rhythm without murmur rub or thrill. Abdomen is benign with no organomegaly or masses noted. Motor sensory and DTR levels are equal and symmetric in the upper and lower extremities. Cranial nerves II through XII are grossly intact. Proprioception is intact. No peripheral adenopathy or edema is identified. No motor or sensory levels are noted. Crude visual fields are within normal range.  LABORATORY DATA: Pathology report reviewed    RADIOLOGY RESULTS: PET CT scan reviewed compatible with above-stated findings.  The one report of positive metastatic disease was outside source.   IMPRESSION: Probable local progression of prostate cancer in 69 year old male with only one imaging study in detail detailing stage IV disease all others around the same time.  Negative for metastatic disease in 69 year old male  PLAN: At this time I will assume this may have been over read from the outside study on his bone metastasis.  It would be highly unlikely over this time to have had a complete resolution of all metastatic disease even on MRI of the lumbar spine study.  I would plan on IMRT image guided treatment to his prostate 80 Gray over 8 weeks.  I have asked Dr. Bernardo Heater to place fiducial markers in his  prostate for image guided daily treatment.  Risks and benefits of treatment including increased lower urinary tract symptoms possible diarrhea fatigue alteration of blood counts skin reaction all were discussed with the patient and his wife.  They both seem to comprehend my treatment plan well.  I have personally set up and ordered CT simulation after markers are placed.  I would like to take this opportunity to thank you for allowing me to participate in the care of your patient.Noreene Filbert, MD

## 2019-10-14 NOTE — Telephone Encounter (Signed)
Phillip Clements from [facility] called requesting a new RX for [place]. One that will reflect his new dosing instructions of 44 units

## 2019-10-15 ENCOUNTER — Encounter: Payer: Self-pay | Admitting: *Deleted

## 2019-10-15 ENCOUNTER — Other Ambulatory Visit: Payer: Self-pay | Admitting: Internal Medicine

## 2019-10-15 ENCOUNTER — Telehealth: Payer: Self-pay | Admitting: Cardiovascular Disease

## 2019-10-15 NOTE — Telephone Encounter (Signed)
Left message for the patient to call back and speak to the preop APP of the day. 

## 2019-10-15 NOTE — Telephone Encounter (Signed)
Patient was seen recently on 2/17 at which time he was doing well. He has a h/o CAD s/p CABG, PAD s/p left SFA angioplasty, HTN, HLD, CVA, DM2, obesity, mitral regurgitation, OSA, and prostate cancer. Seed implantation is considered low risk procedure.   Dr. Fletcher Anon, ok with you to hold plavix for 7 days prior to the surgery? Please send you response to P CV DIV PREOP

## 2019-10-15 NOTE — Telephone Encounter (Signed)
Hold Plavix 5 days before the procedure and resume after

## 2019-10-15 NOTE — Telephone Encounter (Signed)
   [facility] Pre-operative Risk Assessment    Request for surgical clearance:  1. What type of surgery is being performed? Gold Seed  2. When is this surgery scheduled? 10/29/19  3. What type of clearance is required (medical clearance vs. Pharmacy clearance to hold med vs. Both)? pharm  4. Are there any medications that need to be held prior to surgery and how long? Plavix 7 days prior  5. Practice name and name of physician performing surgery? [facility] Dr. Bernardo Heater  6. What is your office phone number [phone]   7.   What is your office fax number [phone]  8.   Anesthesia type (None, local, MAC, general) ? Not noted   Marykay Lex 10/15/2019, 1:44 PM  _________________________________________________________________   (provider comments below)

## 2019-10-15 NOTE — Telephone Encounter (Signed)
Refill sent.

## 2019-10-15 NOTE — Telephone Encounter (Signed)
Pharmacy calling again following up on yesterdays encounter. Pharmacy requests to be called for any questions but asks for the RX refill as soon as possible since patient is out. Ph# [phone]

## 2019-10-19 NOTE — Telephone Encounter (Signed)
Follow up  Pt is returning call regarding pre op clearance. Spoke with PA [employer] and will callback pt in 15 mins,

## 2019-10-19 NOTE — Telephone Encounter (Signed)
   Primary Cardiologist: [name], MD  Chart reviewed as part of pre-operative protocol coverage. Given past medical history and time since last visit, based on ACC/AHA guidelines, Phillip Clements would be at acceptable risk for the planned procedure without further cardiovascular testing.   I will route this recommendation to the requesting party via Epic fax function and remove from pre-op pool.  Please call with questions.  Soph[place], [place] 10/19/2019, 2:16 PM

## 2019-10-21 ENCOUNTER — Other Ambulatory Visit: Payer: Self-pay

## 2019-10-21 ENCOUNTER — Telehealth: Payer: Self-pay | Admitting: Genetic Counselor

## 2019-10-21 DIAGNOSIS — I2510 Atherosclerotic heart disease of native coronary artery without angina pectoris: Secondary | ICD-10-CM

## 2019-10-21 DIAGNOSIS — I10 Essential (primary) hypertension: Secondary | ICD-10-CM

## 2019-10-21 DIAGNOSIS — R079 Chest pain, unspecified: Secondary | ICD-10-CM

## 2019-10-21 DIAGNOSIS — E785 Hyperlipidemia, unspecified: Secondary | ICD-10-CM

## 2019-10-21 MED ORDER — CARVEDILOL 3.125 MG PO TABS: 3.1250 mg | ORAL_TABLET | Freq: Two times a day (BID) | ORAL | 0 refills | Status: DC

## 2019-10-21 NOTE — Telephone Encounter (Signed)
Left message for patient to verify telephone visit for pre reg °

## 2019-10-22 ENCOUNTER — Inpatient Hospital Stay: Payer: Medicare Other | Attending: Genetic Counselor | Admitting: Genetic Counselor

## 2019-10-22 ENCOUNTER — Encounter: Payer: Self-pay | Admitting: Genetic Counselor

## 2019-10-22 DIAGNOSIS — Z8042 Family history of malignant neoplasm of prostate: Secondary | ICD-10-CM | POA: Insufficient documentation

## 2019-10-22 DIAGNOSIS — C61 Malignant neoplasm of prostate: Secondary | ICD-10-CM

## 2019-10-22 NOTE — Progress Notes (Signed)
REFERRING PROVIDER: Cammie Sickle, MD [place],  [place] [address]  PRIMARY PROVIDER:  Tonia Ghent, MD  PRIMARY REASON FOR VISIT:  1. Prostate cancer ([place])   2. Family history of prostate cancer      I connected with Phillip Clements on 10/22/2019 at 10:00 am EDT by MyChart video conference and verified that I am speaking with the correct person using two identifiers.   Patient location: Wife's office Provider location: [facility] office  HISTORY OF PRESENT ILLNESS:   Phillip Clements, a 69 y.o. male, was seen for a [place] cancer genetics consultation at the request of Dr. Rogue Bussing due to a personal and family history of prostate cancer.  Phillip Clements presents to clinic today to discuss the possibility of a hereditary predisposition to cancer, genetic testing, and to further clarify his future cancer risks, as well as potential cancer risks for family members.   In 2007, at the age of 41, Phillip Clements was diagnosed with prostate cancer with a Gleason score of 6 and a PSA of 24.7 at the time of presentation. In 2011 he had a PET scan showing possible metastatic disease, although other scans, including his most recent PET scan on 09/24/19, have not shown evidence of metastatic disease.   Phillip Clements also has a history of colon polyps. His last colonoscopy, performed on 06/11/2017, detected 10 polyps, most of which were adenomatous, although some were hyperplastic.  CANCER HISTORY:  Oncology History Overview Note  1. Prostate adenocarcinoma  a. 02/2006 PSA returned elevated at 17.49  b. 04/03/2006; PSA 24.7; prostate biopsy Gleason 3+3 with 4/4 cores c. tx with HIFU overseas late 2007 in [place] [Dr.Cope]] d. persistent PSA elevation over period 2007-2010; nadir 2.1 09/16/06, peak 15.0 ng/mL  e. 04/2006 bone scan and CT abd/pelvis without evidence of metastasis f. 03/08/2007 CT abdomen/pelvis with no definitive metastatic disease noted  g. 03/18/2007 prostascint scan with no  evidence of disease but small pelvic lymph nodes noted  h. 08/15/2009 prostascint scan with no evidence of local prostate cancer or distant metastases  i. 08/22/2009 PSA 15 ng/mL; 02/06/2010 PSA 20.5 ng/mL  j. 03/08/2010 PSA 20.9 ng/mL  k. 06/02/10 initiation of degarelix  l. 05/25/10 PET CT (Fluoride) revealed heterogenous areas of FDG-avidity in ribs, femurs, humeri, areas also in the metatarsals noted which may have been arthiritic in nature  m. 07/03/10 PSA 9.6 ng/mL  o.  Started Lupron/Casodex; June 2020-PSA from 0.2 to 0.3 -I;  As per patient-Casodex was withdrawn-but noted to have a jump of PSA to 0.9. FEB 2021-evidence of prostatic malignancy in the residual prostate bed; no distant metastatic disease  #comorbidities-CAD s/p CABG; diabetes; PVD  # NGS/MOLECULAR TESTS:NA  # PALLIATIVE CARE EVALUATION: NA  # PAIN MANAGEMENT: NA   DIAGNOSIS:   STAGE:         ;  GOALS:  CURRENT/MOST RECENT THERAPY :     Prostate cancer ([place])  09/16/2007 Initial Diagnosis   Prostate cancer [facility])      Past Medical History:  Diagnosis Date  . Cataract    bilateral eye in 2010  . Coronary artery disease    a. CABG 04/2014: (LIMA->LAD, VG->DIAG, VG->OM2, VG->PDA)  b. 07/2014 early graft failure (VG->OM2 60, LIMA->LAD atretic, VG->PDA & VG->D1 100). s/p successful rotational atherectomy & DES of the LM & pLAD (3.0x24 Promus DES) & mLAD (2.25x24 Promus DES).  . CVA (cerebral infarction)   . Family history of prostate cancer   . Hx of adenomatous  colonic polyps 06/17/2017  . Hyperlipidemia    a. Statin intolerant.  . Hypertension 2000  . Moderate mitral regurgitation    a. 02/2015 Echo: EF 55-60%, no rwma, mod MR, mod BAE, mildly dil RV w/ low nl RV fxn.  . OSA on CPAP   . Peripheral vascular disease ([place])    a. left SFA angioplasty in December 2016  . Prostate cancer ([place])   . Sleep apnea    wears CPAP nightly  . Type II diabetes mellitus ([place])     Past Surgical History:  Procedure  Laterality Date  . ANTERIOR CERVICAL DECOMP/DISCECTOMY FUSION  08/25/2012   Procedure: ANTERIOR CERVICAL DECOMPRESSION/DISCECTOMY FUSION 2 LEVELS;  Surgeon: Hosie Spangle, MD;  Location: [place] NEURO ORS;  Service: Neurosurgery;  Laterality: N/A;  Cervical five-six Cervical six-seven anterior cervical decompression with fusion and plating and bonegraft  . BACK SURGERY    . CARDIAC CATHETERIZATION  04/2014; 07/19/2014  . CORONARY ARTERY BYPASS GRAFT N/A 04/26/2014   Procedure: CORONARY ARTERY BYPASS GRAFTING (CABG), on pump, times four, using left internal mammary artery, right greater saphenous vein harvested endoscopically.;  Surgeon: Ivin Poot, MD;  Location: [place];  Service: Open Heart Surgery;  Laterality: N/A;  LIMA to LAD, SVG to DIAGONAL, SVG to OM1, SVG to PDA) with EVH of the RIGHT THIGH and LOWER EXTREMITY SAPHENOUS VEIN  . Cytoscopy prostatic stone o/w nml  06/21/08   Dr. Jacqlyn Larsen  . ETT myoview  09/13/09   Low risk EF 49%  . High intense focused ultrasound  07/20/06   By Dr. Jacqlyn Larsen  . INTRAOPERATIVE TRANSESOPHAGEAL ECHOCARDIOGRAM N/A 04/26/2014   Procedure: INTRAOPERATIVE TRANSESOPHAGEAL ECHOCARDIOGRAM;  Surgeon: Ivin Poot, MD;  Location: [place];  Service: Open Heart Surgery;  Laterality: N/A;  . PERCUTANEOUS CORONARY ROTOBLATOR INTERVENTION (PCI-R) N/A 07/22/2014   Procedure: PERCUTANEOUS CORONARY ROTOBLATOR INTERVENTION (PCI-R);  Surgeon: Peter M Martinique, MD;  Location: [facility] CATH LAB;  Service: Cardiovascular;  Laterality: N/A;  . PERIPHERAL VASCULAR CATHETERIZATION Left 07/26/2015   Procedure: Lower Extremity Angiography;  Surgeon: Katha Cabal, MD;  Location: [place] CV LAB;  Service: Cardiovascular;  Laterality: Left;  . PERIPHERAL VASCULAR CATHETERIZATION  07/26/2015   Procedure: Lower Extremity Intervention;  Surgeon: Katha Cabal, MD;  Location: [place] CV LAB;  Service: Cardiovascular;;  . PROSTATE BIOPSY  04/03/06 & 02/25/07    Social History    Socioeconomic History  . Marital status: Married    Spouse name: Not on file  . Number of children: 3  . Years of education: Not on file  . Highest education level: Not on file  Occupational History  . Occupation: Insurance account manager  Tobacco Use  . Smoking status: Former Smoker    Packs/day: 1.00    Years: 36.00    Pack years: 36.00    Types: Cigarettes    Quit date: 03/01/2014    Years since quitting: 5.6  . Smokeless tobacco: Never Used  Substance and Sexual Activity  . Alcohol use: Yes    Comment: 07/19/2014 "might have a drink a couple times/yr"  . Drug use: No  . Sexual activity: Not Currently  Other Topics Concern  . 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 In [place].    Social Determinants of Radio broadcast assistant  Strain:   . Difficulty of Paying Living Expenses:   Food Insecurity:   . Worried About Charity fundraiser in the Last Year:   . Arboriculturist in the Last Year:   Transportation Needs:   . Film/video editor (Medical):   Marland Kitchen Lack of Transportation (Non-Medical):   Physical Activity:   . Days of Exercise per Week:   . Minutes of Exercise per Session:   Stress:   . Feeling of Stress :   Social Connections:   . Frequency of Communication with Friends and Family:   . Frequency of Social Gatherings with Friends and Family:   . Attends Religious Services:   . Active Member of Clubs or Organizations:   . Attends Archivist Meetings:   Marland Kitchen Marital Status:      FAMILY HISTORY:  We obtained a detailed, 4-generation family history.  Significant diagnoses are listed below: Family History  Problem Relation Age of Onset  . Diabetes Mother 74       DM  . Coronary artery disease Mother   . Hypertension Mother   .  Heart attack Mother        CABG with MI in 2005  . Heart disease Mother        CV  . Hypertension Father   . Heart attack Father        CABG with Mi around 1997  . Coronary artery disease Father   . Heart disease Father        CV  . Diabetes Father   . Heart attack Brother        MI/PTCA  . Heart disease Brother        CV  . Diabetes Brother   . Heart attack Maternal Grandfather        MI  . Prostate cancer Paternal Grandfather 40       likely metastatic  . Diabetes Sister        DM  . Breast cancer Neg Hx        Breast/ovarian/uterine cancer  . Colon cancer Neg Hx   . Depression Neg Hx   . Alcohol abuse Neg Hx        ETOH/drug abuse  . Stroke Neg Hx    Phillip Clements has a son who is 76 and two daughters in their 56s. He has two sisters (ages 35 and 63) and one brother (age 88), none of whom have had cancer.  Phillip Clements mother died at age 53 and did not have a history of cancer. He had four maternal uncles, all of whom died when they were older than 38. Phillip Clements maternal grandmother died at age 71, and his grandfather died at age 54 from heart disease. There are no known diagnoses of cancer on the maternal side of the family.  Phillip Clements father died at age 24 and did not have a history of cancer. Phillip Clements has two paternal aunts who are currently living at ages 79 and 17 and have not had cancer. His paternal grandmother died at age 44 from an aneurysm, and his paternal grandfather died at age 51 with prostate cancer, which was initially diagnosed around age 18. Phillip Clements believes that his grandfather's prostate cancer was likely metastatic.  Phillip Clements is unaware of previous family history of genetic testing for hereditary cancer risks. Patient's maternal ancestors are of [place], [place], and [place] descent, and paternal ancestors are of [place] descent. There is reported [place]  ancestry on his paternal side, although Phillip Clements is unsure if it was  Ashkenazi Jewish ancestry. There is no known consanguinity.  GENETIC COUNSELING ASSESSMENT: Phillip Clements is a 69 y.o. male with a personal and family history of prostate cancer, which is somewhat suggestive of a hereditary cancer syndrome and predisposition to cancer. We, therefore, discussed and recommended the following at today's visit.   DISCUSSION: We discussed that approximately 5-10% of cancer is hereditary. Most cases of hereditary prostate cancer are associated with the BRCA1/2 genes, although there are other genes that can be associated with hereditary prostate cancer syndromes.  These include ATM, CHEK2, HOXB13, etc.  We discussed that testing is beneficial for several reasons, including knowing about other cancer risks, identifying potential screening and risk-reduction options that may be appropriate, and to understand if other family members could be at risk for cancer and allow them to undergo genetic testing.   We reviewed the characteristics, features and inheritance patterns of hereditary cancer syndromes. We also discussed genetic testing, including the appropriate family members to test, the process of testing, insurance coverage and turn-around-time for results. We discussed the implications of a negative, positive and/or variant of uncertain significant result. We recommended Phillip Clements pursue genetic testing for the Common Hereditary Cancers Panel.   The Common Hereditary Cancers Panel offered by [facility] includes sequencing and/or deletion duplication testing of the following 48 genes: APC, ATM, AXIN2, BARD1, BMPR1A, BRCA1, BRCA2, BRIP1, CDH1, CDK4, CDKN2A (p14ARF), CDKN2A (p16INK4a), CHEK2, CTNNA1, DICER1, EPCAM (Deletion/duplication testing only), GREM1 (promoter region deletion/duplication testing only), KIT, MEN1, MLH1, MSH2, MSH3, MSH6, MUTYH, NBN, NF1, NHTL1, PALB2, PDGFRA, PMS2, POLD1, POLE, PTEN, RAD50, RAD51C, RAD51D, RNF43, SDHB, SDHC, SDHD, SMAD4, SMARCA4. STK11, TP53, TSC1,  TSC2, and VHL.  The following genes are evaluated for sequence changes only: SDHA and HOXB13 c.251G>A variant only.   Based on Phillip Clements personal and family history of prostate cancer, he meets medical criteria for genetic testing. Despite that he meets criteria, he may still have an out of pocket cost. We discussed that if his out of pocket cost for testing is over $100, the laboratory will call and confirm whether he wants to proceed with testing.  If the out of pocket cost of testing is less than $100 he will be billed by the genetic testing laboratory.   PLAN: After considering the risks, benefits, and limitations, Phillip Clements provided informed consent to pursue genetic testing and the blood sample was sent to [facility] for analysis of the Common Hereditary Cancers Panel. Once the laboratory receives his sample, results should be available within approximately two-three weeks' time, at which point they will be disclosed by telephone to Phillip Clements, as will any additional recommendations warranted by these results. Mr. Gongaware will receive a summary of his genetic counseling visit and a copy of his results once available. This information will also be available in Epic.   Mr. Hermann questions were answered to his satisfaction today. Our contact information was provided should additional questions or concerns arise. Thank you for the referral and allowing [place] to share in the care of your patient.   Clint Guy, MS, [facility] Genetic Counselor [email or web address] Phone: [phone]  The patient was seen for a total of 30 minutes in face-to-face genetic counseling.  This patient was discussed with Drs. Magrinat, Lindi Adie and/or Burr Medico who agrees with the above.    _______________________________________________________________________ For Office Staff:  Number of people involved in session: 1 Was an Intern/ student involved with case: no

## 2019-10-29 ENCOUNTER — Other Ambulatory Visit: Payer: Self-pay

## 2019-10-29 ENCOUNTER — Ambulatory Visit (INDEPENDENT_AMBULATORY_CARE_PROVIDER_SITE_OTHER): Payer: Medicare Other | Admitting: Urology

## 2019-10-29 VITALS — BP 135/81 | HR 74 | Ht 69.0 in | Wt 238.0 lb

## 2019-10-29 DIAGNOSIS — C61 Malignant neoplasm of prostate: Secondary | ICD-10-CM | POA: Diagnosis not present

## 2019-10-29 MED ORDER — LEVOFLOXACIN 500 MG PO TABS
500.0000 mg | ORAL_TABLET | Freq: Once | ORAL | Status: AC
  Administered 2019-10-29: 500 mg via ORAL

## 2019-10-29 MED ORDER — GENTAMICIN SULFATE 40 MG/ML IJ SOLN
80.0000 mg | Freq: Once | INTRAMUSCULAR | Status: AC
  Administered 2019-10-29: 80 mg via INTRAMUSCULAR

## 2019-10-29 NOTE — Addendum Note (Signed)
Addended by: Donalee Citrin on: 10/29/2019 11:59 AM   Modules accepted: Orders

## 2019-10-29 NOTE — Progress Notes (Signed)
   10/29/19  Indication: 69 year old male with complex urologic history including distant history of HIFU treatment for prostate cancer with recurrence of PSA, originally thought to have metastatic disease, but most recent imaging suggest metastatic disease less likely.  Discussed at tumor board recently and felt that local treatment with external beam radiation was appropriate.  He is a patient of Dr. Bernardo Heater, but he was unexpectedly out of the office today, and I filled in for gold seed marker placement.  Prostate gold seed marker placement:  Informed consent was obtained, and we discussed the risks of bleeding and infection/sepsis. A time out was performed to ensure correct patient identity.  - Gentamicin and levaquin given for antibiotic prophylaxis - Transrectal Ultrasound performed revealing a 11 gm prostate - No significant hypoechoic or median lobe noted  Procedure: -A total of 3 gold seeds were placed in the prostate with the assistance of ultrasound guidance, 2 at the left and right lateral bases, and one at the apex  Post-Procedure: - Patient tolerated the procedure well - He was counseled to seek immediate medical attention if experiences significant bleeding, fevers, or severe pain -Proceed with external beam radiation with radiation oncology, follow-up with Dr. Bernardo Heater in May 2021 with PSA prior  Nickolas Madrid, MD 10/29/2019

## 2019-11-02 ENCOUNTER — Ambulatory Visit
Admission: RE | Admit: 2019-11-02 | Discharge: 2019-11-02 | Disposition: A | Discharge status: Home / Self Care | Payer: Medicare Other | Source: Ambulatory Visit | Attending: Radiation Oncology | Admitting: Radiation Oncology

## 2019-11-02 ENCOUNTER — Other Ambulatory Visit: Payer: Self-pay | Admitting: Cardiovascular Disease

## 2019-11-02 DIAGNOSIS — C61 Malignant neoplasm of prostate: Secondary | ICD-10-CM | POA: Insufficient documentation

## 2019-11-03 DIAGNOSIS — C61 Malignant neoplasm of prostate: Secondary | ICD-10-CM | POA: Diagnosis not present

## 2019-11-06 ENCOUNTER — Other Ambulatory Visit: Payer: Self-pay | Admitting: *Deleted

## 2019-11-06 DIAGNOSIS — C61 Malignant neoplasm of prostate: Secondary | ICD-10-CM

## 2019-11-10 ENCOUNTER — Ambulatory Visit: Admission: RE | Admit: 2019-11-10 | Payer: Medicare Other | Source: Ambulatory Visit

## 2019-11-11 ENCOUNTER — Ambulatory Visit: Payer: Medicare Other

## 2019-11-12 ENCOUNTER — Ambulatory Visit: Payer: Medicare Other

## 2019-11-13 ENCOUNTER — Ambulatory Visit: Payer: Medicare Other

## 2019-11-16 ENCOUNTER — Ambulatory Visit
Admission: RE | Admit: 2019-11-16 | Discharge: 2019-11-16 | Disposition: A | Discharge status: Home / Self Care | Payer: Medicare Other | Source: Ambulatory Visit | Attending: Radiation Oncology | Admitting: Radiation Oncology

## 2019-11-16 DIAGNOSIS — C61 Malignant neoplasm of prostate: Secondary | ICD-10-CM | POA: Diagnosis not present

## 2019-11-17 ENCOUNTER — Ambulatory Visit
Admission: RE | Admit: 2019-11-17 | Discharge: 2019-11-17 | Disposition: A | Discharge status: Home / Self Care | Payer: Medicare Other | Source: Ambulatory Visit | Attending: Radiation Oncology | Admitting: Radiation Oncology

## 2019-11-17 DIAGNOSIS — C61 Malignant neoplasm of prostate: Secondary | ICD-10-CM | POA: Diagnosis not present

## 2019-11-18 ENCOUNTER — Ambulatory Visit
Admission: RE | Admit: 2019-11-18 | Discharge: 2019-11-18 | Disposition: A | Discharge status: Home / Self Care | Payer: Medicare Other | Source: Ambulatory Visit | Attending: Radiation Oncology | Admitting: Radiation Oncology

## 2019-11-18 DIAGNOSIS — C61 Malignant neoplasm of prostate: Secondary | ICD-10-CM | POA: Diagnosis not present

## 2019-11-19 ENCOUNTER — Ambulatory Visit
Admission: RE | Admit: 2019-11-19 | Discharge: 2019-11-19 | Disposition: A | Discharge status: Home / Self Care | Payer: Medicare Other | Source: Ambulatory Visit | Attending: Radiation Oncology | Admitting: Radiation Oncology

## 2019-11-19 DIAGNOSIS — C61 Malignant neoplasm of prostate: Secondary | ICD-10-CM | POA: Diagnosis not present

## 2019-11-20 ENCOUNTER — Ambulatory Visit
Admission: RE | Admit: 2019-11-20 | Discharge: 2019-11-20 | Disposition: A | Discharge status: Home / Self Care | Payer: Medicare Other | Source: Ambulatory Visit | Attending: Radiation Oncology | Admitting: Radiation Oncology

## 2019-11-20 DIAGNOSIS — C61 Malignant neoplasm of prostate: Secondary | ICD-10-CM | POA: Diagnosis not present

## 2019-11-23 ENCOUNTER — Ambulatory Visit
Admission: RE | Admit: 2019-11-23 | Discharge: 2019-11-23 | Disposition: A | Discharge status: Home / Self Care | Payer: Medicare Other | Source: Ambulatory Visit | Attending: Radiation Oncology | Admitting: Radiation Oncology

## 2019-11-23 DIAGNOSIS — C61 Malignant neoplasm of prostate: Secondary | ICD-10-CM | POA: Diagnosis not present

## 2019-11-24 ENCOUNTER — Ambulatory Visit
Admission: RE | Admit: 2019-11-24 | Discharge: 2019-11-24 | Disposition: A | Discharge status: Home / Self Care | Payer: Medicare Other | Source: Ambulatory Visit | Attending: Radiation Oncology | Admitting: Radiation Oncology

## 2019-11-24 DIAGNOSIS — C61 Malignant neoplasm of prostate: Secondary | ICD-10-CM | POA: Diagnosis not present

## 2019-11-25 ENCOUNTER — Ambulatory Visit
Admission: RE | Admit: 2019-11-25 | Discharge: 2019-11-25 | Disposition: A | Discharge status: Home / Self Care | Payer: Medicare Other | Source: Ambulatory Visit | Attending: Radiation Oncology | Admitting: Radiation Oncology

## 2019-11-25 DIAGNOSIS — C61 Malignant neoplasm of prostate: Secondary | ICD-10-CM | POA: Diagnosis not present

## 2019-11-26 ENCOUNTER — Inpatient Hospital Stay: Payer: Medicare Other | Attending: Internal Medicine

## 2019-11-26 ENCOUNTER — Ambulatory Visit
Admission: RE | Admit: 2019-11-26 | Discharge: 2019-11-26 | Disposition: A | Discharge status: Home / Self Care | Payer: Medicare Other | Source: Ambulatory Visit | Attending: Radiation Oncology | Admitting: Radiation Oncology

## 2019-11-26 ENCOUNTER — Other Ambulatory Visit: Payer: Self-pay

## 2019-11-26 DIAGNOSIS — I2510 Atherosclerotic heart disease of native coronary artery without angina pectoris: Secondary | ICD-10-CM | POA: Insufficient documentation

## 2019-11-26 DIAGNOSIS — Z8673 Personal history of transient ischemic attack (TIA), and cerebral infarction without residual deficits: Secondary | ICD-10-CM | POA: Diagnosis not present

## 2019-11-26 DIAGNOSIS — C61 Malignant neoplasm of prostate: Secondary | ICD-10-CM | POA: Diagnosis not present

## 2019-11-26 DIAGNOSIS — Z87891 Personal history of nicotine dependence: Secondary | ICD-10-CM | POA: Insufficient documentation

## 2019-11-26 DIAGNOSIS — C7951 Secondary malignant neoplasm of bone: Secondary | ICD-10-CM | POA: Insufficient documentation

## 2019-11-26 LAB — CBC
HCT: 44.4 % (ref 39.0–52.0)
Hemoglobin: 15.5 g/dL (ref 13.0–17.0)
MCH: 29.4 pg (ref 26.0–34.0)
MCHC: 34.9 g/dL (ref 30.0–36.0)
MCV: 84.1 fL (ref 80.0–100.0)
Platelets: 189 10*3/uL (ref 150–400)
RBC: 5.28 MIL/uL (ref 4.22–5.81)
RDW: 12.9 % (ref 11.5–15.5)
WBC: 6.1 10*3/uL (ref 4.0–10.5)
nRBC: 0 % (ref 0.0–0.2)

## 2019-11-27 ENCOUNTER — Ambulatory Visit
Admission: RE | Admit: 2019-11-27 | Discharge: 2019-11-27 | Disposition: A | Discharge status: Home / Self Care | Payer: Medicare Other | Source: Ambulatory Visit | Attending: Radiation Oncology | Admitting: Radiation Oncology

## 2019-11-27 ENCOUNTER — Other Ambulatory Visit: Payer: Self-pay | Admitting: Family Medicine

## 2019-11-27 DIAGNOSIS — C61 Malignant neoplasm of prostate: Secondary | ICD-10-CM

## 2019-11-30 ENCOUNTER — Other Ambulatory Visit: Payer: Medicare Other

## 2019-11-30 ENCOUNTER — Telehealth: Payer: Self-pay | Admitting: Genetic Counselor

## 2019-11-30 ENCOUNTER — Other Ambulatory Visit: Payer: Self-pay

## 2019-11-30 ENCOUNTER — Ambulatory Visit
Admission: RE | Admit: 2019-11-30 | Discharge: 2019-11-30 | Disposition: A | Discharge status: Home / Self Care | Payer: Medicare Other | Source: Ambulatory Visit | Attending: Radiation Oncology | Admitting: Radiation Oncology

## 2019-11-30 DIAGNOSIS — C61 Malignant neoplasm of prostate: Secondary | ICD-10-CM

## 2019-11-30 NOTE — Telephone Encounter (Signed)
LVM regarding genetic testing status. His sample failed and the genetic testing laboratory was unable to complete his test. Requested that he call back to discuss sending another sample to the laboratory.

## 2019-12-01 ENCOUNTER — Ambulatory Visit
Admission: RE | Admit: 2019-12-01 | Discharge: 2019-12-01 | Disposition: A | Discharge status: Home / Self Care | Payer: Medicare Other | Source: Ambulatory Visit | Attending: Radiation Oncology | Admitting: Radiation Oncology

## 2019-12-01 DIAGNOSIS — C61 Malignant neoplasm of prostate: Secondary | ICD-10-CM | POA: Diagnosis not present

## 2019-12-01 LAB — PSA: Prostate Specific Ag, Serum: 0.9 ng/mL (ref 0.0–4.0)

## 2019-12-02 ENCOUNTER — Ambulatory Visit
Admission: RE | Admit: 2019-12-02 | Discharge: 2019-12-02 | Disposition: A | Discharge status: Home / Self Care | Payer: Medicare Other | Source: Ambulatory Visit | Attending: Radiation Oncology | Admitting: Radiation Oncology

## 2019-12-02 DIAGNOSIS — C61 Malignant neoplasm of prostate: Secondary | ICD-10-CM | POA: Diagnosis not present

## 2019-12-03 ENCOUNTER — Ambulatory Visit
Admission: RE | Admit: 2019-12-03 | Discharge: 2019-12-03 | Disposition: A | Discharge status: Home / Self Care | Payer: Medicare Other | Source: Ambulatory Visit | Attending: Radiation Oncology | Admitting: Radiation Oncology

## 2019-12-03 DIAGNOSIS — C61 Malignant neoplasm of prostate: Secondary | ICD-10-CM | POA: Diagnosis not present

## 2019-12-04 ENCOUNTER — Ambulatory Visit
Admission: RE | Admit: 2019-12-04 | Discharge: 2019-12-04 | Disposition: A | Discharge status: Home / Self Care | Payer: Medicare Other | Source: Ambulatory Visit | Attending: Radiation Oncology | Admitting: Radiation Oncology

## 2019-12-04 ENCOUNTER — Ambulatory Visit: Payer: Medicare Other | Admitting: Urology

## 2019-12-04 DIAGNOSIS — C61 Malignant neoplasm of prostate: Secondary | ICD-10-CM | POA: Diagnosis not present

## 2019-12-07 ENCOUNTER — Ambulatory Visit
Admission: RE | Admit: 2019-12-07 | Discharge: 2019-12-07 | Disposition: A | Discharge status: Home / Self Care | Payer: Medicare Other | Source: Ambulatory Visit | Attending: Radiation Oncology | Admitting: Radiation Oncology

## 2019-12-07 ENCOUNTER — Ambulatory Visit: Payer: Medicare Other

## 2019-12-07 DIAGNOSIS — C61 Malignant neoplasm of prostate: Secondary | ICD-10-CM | POA: Diagnosis not present

## 2019-12-08 ENCOUNTER — Ambulatory Visit
Admission: RE | Admit: 2019-12-08 | Discharge: 2019-12-08 | Disposition: A | Discharge status: Home / Self Care | Payer: Medicare Other | Source: Ambulatory Visit | Attending: Radiation Oncology | Admitting: Radiation Oncology

## 2019-12-08 ENCOUNTER — Ambulatory Visit: Payer: Medicare Other

## 2019-12-08 DIAGNOSIS — C61 Malignant neoplasm of prostate: Secondary | ICD-10-CM | POA: Diagnosis not present

## 2019-12-09 ENCOUNTER — Ambulatory Visit: Payer: Self-pay | Admitting: Urology

## 2019-12-09 ENCOUNTER — Ambulatory Visit: Payer: Medicare Other

## 2019-12-09 ENCOUNTER — Ambulatory Visit
Admission: RE | Admit: 2019-12-09 | Discharge: 2019-12-09 | Disposition: A | Discharge status: Home / Self Care | Payer: Medicare Other | Source: Ambulatory Visit | Attending: Radiation Oncology | Admitting: Radiation Oncology

## 2019-12-09 DIAGNOSIS — C61 Malignant neoplasm of prostate: Secondary | ICD-10-CM | POA: Diagnosis not present

## 2019-12-10 ENCOUNTER — Inpatient Hospital Stay: Payer: Medicare Other

## 2019-12-10 ENCOUNTER — Other Ambulatory Visit: Payer: Self-pay | Admitting: Cardiovascular Disease

## 2019-12-10 ENCOUNTER — Inpatient Hospital Stay (HOSPITAL_BASED_OUTPATIENT_CLINIC_OR_DEPARTMENT_OTHER): Payer: Medicare Other | Admitting: Internal Medicine

## 2019-12-10 ENCOUNTER — Other Ambulatory Visit: Payer: Self-pay

## 2019-12-10 ENCOUNTER — Ambulatory Visit
Admission: RE | Admit: 2019-12-10 | Discharge: 2019-12-10 | Disposition: A | Discharge status: Home / Self Care | Payer: Medicare Other | Source: Ambulatory Visit | Attending: Radiation Oncology | Admitting: Radiation Oncology

## 2019-12-10 DIAGNOSIS — C61 Malignant neoplasm of prostate: Secondary | ICD-10-CM

## 2019-12-10 DIAGNOSIS — I2510 Atherosclerotic heart disease of native coronary artery without angina pectoris: Secondary | ICD-10-CM

## 2019-12-10 DIAGNOSIS — Z87891 Personal history of nicotine dependence: Secondary | ICD-10-CM | POA: Diagnosis not present

## 2019-12-10 DIAGNOSIS — C7951 Secondary malignant neoplasm of bone: Secondary | ICD-10-CM | POA: Diagnosis not present

## 2019-12-10 DIAGNOSIS — Z8673 Personal history of transient ischemic attack (TIA), and cerebral infarction without residual deficits: Secondary | ICD-10-CM | POA: Diagnosis not present

## 2019-12-10 LAB — CBC
HCT: 44 % (ref 39.0–52.0)
Hemoglobin: 15 g/dL (ref 13.0–17.0)
MCH: 28.9 pg (ref 26.0–34.0)
MCHC: 34.1 g/dL (ref 30.0–36.0)
MCV: 84.8 fL (ref 80.0–100.0)
Platelets: 155 10*3/uL (ref 150–400)
RBC: 5.19 MIL/uL (ref 4.22–5.81)
RDW: 12.9 % (ref 11.5–15.5)
WBC: 5.6 10*3/uL (ref 4.0–10.5)
nRBC: 0 % (ref 0.0–0.2)

## 2019-12-10 NOTE — Telephone Encounter (Signed)
[name] called regarding his genetic testing sample failure. We will have the laboratory send another saliva kit to [name]. If this second saliva sample does not work, it will be best to try again with a blood sample.

## 2019-12-10 NOTE — Progress Notes (Signed)
[facility] CONSULT NOTE  Patient Care Team: Tonia Ghent, MD as PCP - General (Family Medicine) Wellington Hampshire, MD as PCP - Cardiology (Cardiology)  CHIEF COMPLAINTS/PURPOSE OF CONSULTATION: Prostate cancer  #  Oncology History Overview Note  1. Prostate adenocarcinoma  a. 02/2006 PSA returned elevated at 17.49  b. 04/03/2006; PSA 24.7; prostate biopsy Gleason 3+3 with 4/4 cores c. tx with HIFU overseas late 2007 in [place] [Dr.Cope]] d. persistent PSA elevation over period 2007-2010; nadir 2.1 09/16/06, peak 15.0 ng/mL  e. 04/2006 bone scan and CT abd/pelvis without evidence of metastasis f. 03/08/2007 CT abdomen/pelvis with no definitive metastatic disease noted  g. 03/18/2007 prostascint scan with no evidence of disease but small pelvic lymph nodes noted  h. 08/15/2009 prostascint scan with no evidence of local prostate cancer or distant metastases  i. 08/22/2009 PSA 15 ng/mL; 02/06/2010 PSA 20.5 ng/mL  j. 03/08/2010 PSA 20.9 ng/mL  k. 06/02/10 initiation of degarelix  l. 05/25/10 PET CT (Fluoride) revealed heterogenous areas of FDG-avidity in ribs, femurs, humeri, areas also in the metatarsals noted which may have been arthiritic in nature  m. 07/03/10 PSA 9.6 ng/mL  o.  Started Lupron/Casodex; June 2020-PSA from 0.2 to 0.3 -I;  As per patient-Casodex was withdrawn-but noted to have a jump of PSA to 0.9. FEB 2021-evidence of prostatic malignancy in the residual prostate bed; no distant metastatic disease  #comorbidities-CAD s/p CABG; diabetes; PVD  # NGS/MOLECULAR TESTS:NA  # PALLIATIVE CARE EVALUATION: NA  # PAIN MANAGEMENT: NA   DIAGNOSIS:   STAGE:         ;  GOALS:  CURRENT/MOST RECENT THERAPY :     Prostate cancer ([place])  09/16/2007 Initial Diagnosis   Prostate cancer ([place])    HISTORY OF PRESENTING ILLNESS:  Phillip Clements 69 y.o.  male with longstanding history of prostate cancer ? metastatic to bone/Lupron-is here for follow-up.  Patient is currently  getting external beam radiation to his prostate.  Patient tolerating well.  No nausea no vomiting.  No skin rash.    Review of Systems  Constitutional: Positive for malaise/fatigue. Negative for chills, diaphoresis, fever and weight loss.  HENT: Negative for nosebleeds and sore throat.   Eyes: Negative for double vision.  Respiratory: Negative for cough, hemoptysis, sputum production, shortness of breath and wheezing.   Cardiovascular: Negative for chest pain, palpitations, orthopnea and leg swelling.  Gastrointestinal: Negative for abdominal pain, blood in stool, constipation, diarrhea, heartburn, melena, nausea and vomiting.  Genitourinary: Negative for dysuria, frequency and urgency.  Musculoskeletal: Positive for back pain. Negative for joint pain.  Skin: Negative.  Negative for itching and rash.  Neurological: Negative for dizziness, tingling, focal weakness, weakness and headaches.  Endo/Heme/Allergies: Does not bruise/bleed easily.  Psychiatric/Behavioral: Negative for depression. The patient is not nervous/anxious and does not have insomnia.      MEDICAL HISTORY:  Past Medical History:  Diagnosis Date  . Cataract    bilateral eye in 2010  . Coronary artery disease    a. CABG 04/2014: (LIMA->LAD, VG->DIAG, VG->OM2, VG->PDA)  b. 07/2014 early graft failure (VG->OM2 60, LIMA->LAD atretic, VG->PDA & VG->D1 100). s/p successful rotational atherectomy & DES of the LM & pLAD (3.0x24 Promus DES) & mLAD (2.25x24 Promus DES).  . CVA (cerebral infarction)   . Family history of prostate cancer   . Hx of adenomatous colonic polyps 06/17/2017  . Hyperlipidemia    a. Statin intolerant.  . Hypertension 2000  . Moderate mitral regurgitation  a. 02/2015 Echo: EF 55-60%, no rwma, mod MR, mod BAE, mildly dil RV w/ low nl RV fxn.  . OSA on CPAP   . Peripheral vascular disease ([place])    a. left SFA angioplasty in December 2016  . Prostate cancer ([place])   . Sleep apnea    wears CPAP nightly  .  Type II diabetes mellitus ([place])     SURGICAL HISTORY: Past Surgical History:  Procedure Laterality Date  . ANTERIOR CERVICAL DECOMP/DISCECTOMY FUSION  08/25/2012   Procedure: ANTERIOR CERVICAL DECOMPRESSION/DISCECTOMY FUSION 2 LEVELS;  Surgeon: Hosie Spangle, MD;  Location: [facility];  Service: Neurosurgery;  Laterality: N/A;  Cervical five-six Cervical six-seven anterior cervical decompression with fusion and plating and bonegraft  . BACK SURGERY    . CARDIAC CATHETERIZATION  04/2014; 07/19/2014  . CORONARY ARTERY BYPASS GRAFT N/A 04/26/2014   Procedure: CORONARY ARTERY BYPASS GRAFTING (CABG), on pump, times four, using left internal mammary artery, right greater saphenous vein harvested endoscopically.;  Surgeon: Ivin Poot, MD;  Location: [facility];  Service: Open Heart Surgery;  Laterality: N/A;  LIMA to LAD, SVG to DIAGONAL, SVG to OM1, SVG to PDA) with EVH of the RIGHT THIGH and LOWER EXTREMITY SAPHENOUS VEIN  . Cytoscopy prostatic stone o/w nml  06/21/08   Dr. Jacqlyn Larsen  . ETT myoview  09/13/09   Low risk EF 49%  . High intense focused ultrasound  07/20/06   By Dr. Jacqlyn Larsen  . INTRAOPERATIVE TRANSESOPHAGEAL ECHOCARDIOGRAM N/A 04/26/2014   Procedure: INTRAOPERATIVE TRANSESOPHAGEAL ECHOCARDIOGRAM;  Surgeon: Ivin Poot, MD;  Location: [facility];  Service: Open Heart Surgery;  Laterality: N/A;  . PERCUTANEOUS CORONARY ROTOBLATOR INTERVENTION (PCI-R) N/A 07/22/2014   Procedure: PERCUTANEOUS CORONARY ROTOBLATOR INTERVENTION (PCI-R);  Surgeon: Peter M Martinique, MD;  Location: [facility] CATH LAB;  Service: Cardiovascular;  Laterality: N/A;  . PERIPHERAL VASCULAR CATHETERIZATION Left 07/26/2015   Procedure: Lower Extremity Angiography;  Surgeon: Katha Cabal, MD;  Location: [facility] CV LAB;  Service: Cardiovascular;  Laterality: Left;  . PERIPHERAL VASCULAR CATHETERIZATION  07/26/2015   Procedure: Lower Extremity Intervention;  Surgeon: Katha Cabal, MD;  Location: [facility] CV LAB;  Service:  Cardiovascular;;  . PROSTATE BIOPSY  04/03/06 & 02/25/07    SOCIAL HISTORY: Social History   Socioeconomic History  . Marital status: Married    Spouse name: Not on file  . Number of children: 3  . Years of education: Not on file  . Highest education level: Not on file  Occupational History  . Occupation: Insurance account manager  Tobacco Use  . Smoking status: Former Smoker    Packs/day: 1.00    Years: 36.00    Pack years: 36.00    Types: Cigarettes    Quit date: 03/01/2014    Years since quitting: 5.7  . Smokeless tobacco: Never Used  Substance and Sexual Activity  . Alcohol use: Yes    Comment: 07/19/2014 "might have a drink a couple times/yr"  . Drug use: No  . Sexual activity: Not Currently  Other Topics Concern  . 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 In [place].    Social Determinants of Health   Financial Resource Strain:   . Difficulty of Paying Living Expenses:   Food Insecurity:   . Worried About Estate manager/land agent  of Food in the Last Year:   . [place] in the Last Year:   Transportation Needs:   . Lack of Transportation (Medical):   Marland Kitchen Lack of Transportation (Non-Medical):   Physical Activity:   . Days of Exercise per Week:   . Minutes of Exercise per Session:   Stress:   . Feeling of Stress :   Social Connections:   . Frequency of Communication with Friends and Family:   . Frequency of Social Gatherings with Friends and Family:   . Attends Religious Services:   . Active Member of Clubs or Organizations:   . Attends Archivist Meetings:   Marland Kitchen Marital Status:   Intimate Partner Violence:   . Fear of Current or Ex-Partner:   . Emotionally Abused:   Marland Kitchen Physically Abused:   . Sexually Abused:     FAMILY HISTORY: Family  History  Problem Relation Age of Onset  . Diabetes Mother 64       DM  . Coronary artery disease Mother   . Hypertension Mother   . Heart attack Mother        CABG with MI in 2005  . Heart disease Mother        CV  . Hypertension Father   . Heart attack Father        CABG with Mi around 1997  . Coronary artery disease Father   . Heart disease Father        CV  . Diabetes Father   . Heart attack Brother        MI/PTCA  . Heart disease Brother        CV  . Diabetes Brother   . Heart attack Maternal Grandfather        MI  . Prostate cancer Paternal Grandfather 70       likely metastatic  . Diabetes Sister        DM  . Breast cancer Neg Hx        Breast/ovarian/uterine cancer  . Colon cancer Neg Hx   . Depression Neg Hx   . Alcohol abuse Neg Hx        ETOH/drug abuse  . Stroke Neg Hx     ALLERGIES:  is allergic to coreg [carvedilol]; metformin and related; protonix [pantoprazole sodium]; rosuvastatin calcium; and statins.  MEDICATIONS:  Current Outpatient Medications  Medication Sig Dispense Refill  . aspirin EC 81 MG tablet Take 1 tablet (81 mg total) by mouth daily. 90 tablet 3  . canagliflozin (INVOKANA) 300 MG TABS tablet TAKE 1 TABLET BY MOUTH ONCE DAILY WITH BREAKFAST 90 tablet 3  . carvedilol (COREG) 3.125 MG tablet Take 1 tablet (3.125 mg total) by mouth 2 (two) times daily with a meal. 180 tablet 0  . clopidogrel (PLAVIX) 75 MG tablet Take 1 tablet (75 mg total) by mouth daily. 30 tablet 4  . Dulaglutide (TRULICITY) 3 0000000 SOPN Inject 3 mg into the skin once a week. 12 pen 3  . ezetimibe (ZETIA) 10 MG tablet TAKE 1 TABLET BY MOUTH ONCE DAILY 30 tablet 5  . glucose blood (ONETOUCH ULTRA) test strip CHECK BLOOD SUGAR TWICE A DAY - DX E11.59 200 strip 3  . Injection Device ([place]) MISC Frequency:PHARMDIR   Dosage:0.0     Instructions:  Note:diagnosis 250.02 Dose: NA    . insulin degludec (TRESIBA FLEXTOUCH) 200 UNIT/ML FlexTouch Pen Inject 44  Units into the skin daily. Appointment is needed for refills  9 mL 1  . Insulin Pen Needle 32G X 4 MM MISC Use 1x a day 100 each 3  . leuprolide (LUPRON) 30 MG injection Inject 45 mg into the muscle every 6 (six) months.    Marland Kitchen losartan (COZAAR) 25 MG tablet TAKE 1/2 TABLET BY MOUTH ONCE A DAY 15 tablet 5  . meclizine (ANTIVERT) 25 MG tablet Take 0.5-1 tablets (12.5-25 mg total) by mouth 2 (two) times daily as needed for dizziness or nausea. 30 tablet 0  . nitroGLYCERIN (NITROSTAT) 0.4 MG SL tablet Place 1 tablet (0.4 mg total) under the tongue as needed. 25 tablet 3  . REPATHA SURECLICK XX123456 MG/ML SOAJ INJECT ONE PEN INTO THE SKIN EVERY 14 DAYS 2 mL 3   No current facility-administered medications for this visit.      Marland Kitchen  PHYSICAL EXAMINATION: ECOG PERFORMANCE STATUS: 0 - Asymptomatic  Vitals:   12/10/19 0936  BP: (!) 146/64  Pulse: 66  Temp: (!) 95.2 F (35.1 C)   Filed Weights   12/10/19 0936  Weight: 241 lb (109.3 kg)    Physical Exam  Constitutional: He is oriented to person, place, and time and well-developed, well-nourished, and in no distress.  HENT:  Head: Normocephalic and atraumatic.  Mouth/Throat: Oropharynx is clear and moist. No oropharyngeal exudate.  Eyes: Pupils are equal, round, and reactive to light.  Cardiovascular: Normal rate and regular rhythm.  Pulmonary/Chest: Effort normal and breath sounds normal. No respiratory distress. He has no wheezes.  Abdominal: Soft. Bowel sounds are normal. He exhibits no distension and no mass. There is no abdominal tenderness. There is no rebound and no guarding.  Musculoskeletal:        General: No tenderness or edema. Normal range of motion.     Cervical back: Normal range of motion and neck supple.  Neurological: He is alert and oriented to person, place, and time.  Skin: Skin is warm.  Psychiatric: Affect normal.    LABORATORY DATA:  I have reviewed the data as listed Lab Results  Component Value Date   WBC 5.6  12/10/2019   HGB 15.0 12/10/2019   HCT 44.0 12/10/2019   MCV 84.8 12/10/2019   PLT 155 12/10/2019   Recent Labs    10/09/19 0952  NA 139  K 4.2  CL 103  CO2 28  GLUCOSE 201*  BUN 23  CREATININE 0.91  CALCIUM 8.7*  GFRNONAA >60  GFRAA >60  PROT 6.8  ALBUMIN 4.0  AST 12*  ALT 20  ALKPHOS 63  BILITOT 0.9    RADIOGRAPHIC STUDIES: I have personally reviewed the radiological images as listed and agreed with the findings in the report. No results found.  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 PET scan February 2021-shows no uptake in the bones; shows uptake in the prostate bed; currently on EBRT to prostate [last Tx- on 01/08/2020].  #Recommend continued Lupron; [Dr. Bernardo Heater early June 2021]; await PSA.  Discussed the role of antiandrogen therapy like-apalutamide/Xtandi.  #Prostate cancer-at age of 58 s/p-genetic counseling.  Awaiting repeat testing/results.  # DISPOSITION: #  Follow up in 1st week of august; MD labs; cbc/cmp/PSA- Dr.B    All questions were answered. The patient knows to call the clinic with any problems, questions or concerns.    Cammie Sickle, MD 12/13/2019 4:38 PM

## 2019-12-10 NOTE — Assessment & Plan Note (Addendum)
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 PET scan February 2021-shows no uptake in the bones; shows uptake in the prostate bed; currently on EBRT to prostate [last Tx- on 01/08/2020].  #Recommend continued Lupron; [Dr. Bernardo Heater early June 2021]; await PSA.  Discussed the role of antiandrogen therapy like-apalutamide/Xtandi.  #Prostate cancer-at age of 64 s/p-genetic counseling.  Awaiting repeat testing/results.  # DISPOSITION: #  Follow up in 1st week of august; MD labs; cbc/cmp/PSA- Dr.B

## 2019-12-11 ENCOUNTER — Ambulatory Visit
Admission: RE | Admit: 2019-12-11 | Discharge: 2019-12-11 | Disposition: A | Discharge status: Home / Self Care | Payer: Medicare Other | Source: Ambulatory Visit | Attending: Radiation Oncology | Admitting: Radiation Oncology

## 2019-12-11 DIAGNOSIS — C61 Malignant neoplasm of prostate: Secondary | ICD-10-CM | POA: Diagnosis not present

## 2019-12-14 ENCOUNTER — Ambulatory Visit: Payer: Medicare Other

## 2019-12-14 ENCOUNTER — Ambulatory Visit
Admission: RE | Admit: 2019-12-14 | Discharge: 2019-12-14 | Disposition: A | Discharge status: Home / Self Care | Payer: Medicare Other | Source: Ambulatory Visit | Attending: Radiation Oncology | Admitting: Radiation Oncology

## 2019-12-14 DIAGNOSIS — C61 Malignant neoplasm of prostate: Secondary | ICD-10-CM | POA: Insufficient documentation

## 2019-12-15 ENCOUNTER — Ambulatory Visit: Payer: Medicare Other

## 2019-12-15 ENCOUNTER — Ambulatory Visit
Admission: RE | Admit: 2019-12-15 | Discharge: 2019-12-15 | Disposition: A | Discharge status: Home / Self Care | Payer: Medicare Other | Source: Ambulatory Visit | Attending: Radiation Oncology | Admitting: Radiation Oncology

## 2019-12-15 DIAGNOSIS — C61 Malignant neoplasm of prostate: Secondary | ICD-10-CM | POA: Diagnosis not present

## 2019-12-16 ENCOUNTER — Ambulatory Visit: Payer: Medicare Other

## 2019-12-16 ENCOUNTER — Ambulatory Visit
Admission: RE | Admit: 2019-12-16 | Discharge: 2019-12-16 | Disposition: A | Discharge status: Home / Self Care | Payer: Medicare Other | Source: Ambulatory Visit | Attending: Radiation Oncology | Admitting: Radiation Oncology

## 2019-12-16 DIAGNOSIS — C61 Malignant neoplasm of prostate: Secondary | ICD-10-CM | POA: Diagnosis not present

## 2019-12-17 ENCOUNTER — Ambulatory Visit
Admission: RE | Admit: 2019-12-17 | Discharge: 2019-12-17 | Disposition: A | Discharge status: Home / Self Care | Payer: Medicare Other | Source: Ambulatory Visit | Attending: Radiation Oncology | Admitting: Radiation Oncology

## 2019-12-17 DIAGNOSIS — C61 Malignant neoplasm of prostate: Secondary | ICD-10-CM | POA: Diagnosis not present

## 2019-12-18 ENCOUNTER — Ambulatory Visit
Admission: RE | Admit: 2019-12-18 | Discharge: 2019-12-18 | Disposition: A | Discharge status: Home / Self Care | Payer: Medicare Other | Source: Ambulatory Visit | Attending: Radiation Oncology | Admitting: Radiation Oncology

## 2019-12-18 DIAGNOSIS — C61 Malignant neoplasm of prostate: Secondary | ICD-10-CM | POA: Diagnosis not present

## 2019-12-21 ENCOUNTER — Ambulatory Visit
Admission: RE | Admit: 2019-12-21 | Discharge: 2019-12-21 | Disposition: A | Discharge status: Home / Self Care | Payer: Medicare Other | Source: Ambulatory Visit | Attending: Radiation Oncology | Admitting: Radiation Oncology

## 2019-12-21 DIAGNOSIS — C61 Malignant neoplasm of prostate: Secondary | ICD-10-CM | POA: Diagnosis not present

## 2019-12-22 ENCOUNTER — Ambulatory Visit
Admission: RE | Admit: 2019-12-22 | Discharge: 2019-12-22 | Disposition: A | Discharge status: Home / Self Care | Payer: Medicare Other | Source: Ambulatory Visit | Attending: Radiation Oncology | Admitting: Radiation Oncology

## 2019-12-22 DIAGNOSIS — C61 Malignant neoplasm of prostate: Secondary | ICD-10-CM | POA: Diagnosis not present

## 2019-12-23 ENCOUNTER — Ambulatory Visit
Admission: RE | Admit: 2019-12-23 | Discharge: 2019-12-23 | Disposition: A | Discharge status: Home / Self Care | Payer: Medicare Other | Source: Ambulatory Visit | Attending: Radiation Oncology | Admitting: Radiation Oncology

## 2019-12-23 DIAGNOSIS — C61 Malignant neoplasm of prostate: Secondary | ICD-10-CM | POA: Diagnosis not present

## 2019-12-24 ENCOUNTER — Other Ambulatory Visit: Payer: Self-pay

## 2019-12-24 ENCOUNTER — Ambulatory Visit
Admission: RE | Admit: 2019-12-24 | Discharge: 2019-12-24 | Disposition: A | Discharge status: Home / Self Care | Payer: Medicare Other | Source: Ambulatory Visit | Attending: Radiation Oncology | Admitting: Radiation Oncology

## 2019-12-24 ENCOUNTER — Inpatient Hospital Stay: Payer: Medicare Other | Attending: Internal Medicine

## 2019-12-24 DIAGNOSIS — C61 Malignant neoplasm of prostate: Secondary | ICD-10-CM | POA: Diagnosis not present

## 2019-12-24 LAB — CBC
HCT: 43.3 % (ref 39.0–52.0)
Hemoglobin: 15.1 g/dL (ref 13.0–17.0)
MCH: 29.5 pg (ref 26.0–34.0)
MCHC: 34.9 g/dL (ref 30.0–36.0)
MCV: 84.6 fL (ref 80.0–100.0)
Platelets: 162 10*3/uL (ref 150–400)
RBC: 5.12 MIL/uL (ref 4.22–5.81)
RDW: 12.8 % (ref 11.5–15.5)
WBC: 5.1 10*3/uL (ref 4.0–10.5)
nRBC: 0 % (ref 0.0–0.2)

## 2019-12-25 ENCOUNTER — Ambulatory Visit
Admission: RE | Admit: 2019-12-25 | Discharge: 2019-12-25 | Disposition: A | Discharge status: Home / Self Care | Payer: Medicare Other | Source: Ambulatory Visit | Attending: Radiation Oncology | Admitting: Radiation Oncology

## 2019-12-25 DIAGNOSIS — C61 Malignant neoplasm of prostate: Secondary | ICD-10-CM | POA: Diagnosis not present

## 2019-12-28 ENCOUNTER — Ambulatory Visit
Admission: RE | Admit: 2019-12-28 | Discharge: 2019-12-28 | Disposition: A | Discharge status: Home / Self Care | Payer: Medicare Other | Source: Ambulatory Visit | Attending: Radiation Oncology | Admitting: Radiation Oncology

## 2019-12-28 DIAGNOSIS — C61 Malignant neoplasm of prostate: Secondary | ICD-10-CM | POA: Diagnosis not present

## 2019-12-29 ENCOUNTER — Ambulatory Visit
Admission: RE | Admit: 2019-12-29 | Discharge: 2019-12-29 | Disposition: A | Discharge status: Home / Self Care | Payer: Medicare Other | Source: Ambulatory Visit | Attending: Radiation Oncology | Admitting: Radiation Oncology

## 2019-12-29 DIAGNOSIS — C61 Malignant neoplasm of prostate: Secondary | ICD-10-CM | POA: Diagnosis not present

## 2019-12-30 ENCOUNTER — Ambulatory Visit
Admission: RE | Admit: 2019-12-30 | Discharge: 2019-12-30 | Disposition: A | Discharge status: Home / Self Care | Payer: Medicare Other | Source: Ambulatory Visit | Attending: Radiation Oncology | Admitting: Radiation Oncology

## 2019-12-30 DIAGNOSIS — C61 Malignant neoplasm of prostate: Secondary | ICD-10-CM | POA: Diagnosis not present

## 2019-12-31 ENCOUNTER — Ambulatory Visit
Admission: RE | Admit: 2019-12-31 | Discharge: 2019-12-31 | Disposition: A | Discharge status: Home / Self Care | Payer: Medicare Other | Source: Ambulatory Visit | Attending: Radiation Oncology | Admitting: Radiation Oncology

## 2019-12-31 DIAGNOSIS — C61 Malignant neoplasm of prostate: Secondary | ICD-10-CM | POA: Diagnosis not present

## 2020-01-01 ENCOUNTER — Ambulatory Visit
Admission: RE | Admit: 2020-01-01 | Discharge: 2020-01-01 | Disposition: A | Discharge status: Home / Self Care | Payer: Medicare Other | Source: Ambulatory Visit | Attending: Radiation Oncology | Admitting: Radiation Oncology

## 2020-01-01 DIAGNOSIS — C61 Malignant neoplasm of prostate: Secondary | ICD-10-CM | POA: Diagnosis not present

## 2020-01-04 ENCOUNTER — Ambulatory Visit
Admission: RE | Admit: 2020-01-04 | Discharge: 2020-01-04 | Disposition: A | Discharge status: Home / Self Care | Payer: Medicare Other | Source: Ambulatory Visit | Attending: Radiation Oncology | Admitting: Radiation Oncology

## 2020-01-04 DIAGNOSIS — C61 Malignant neoplasm of prostate: Secondary | ICD-10-CM | POA: Diagnosis not present

## 2020-01-05 ENCOUNTER — Ambulatory Visit: Payer: Medicare Other

## 2020-01-05 ENCOUNTER — Ambulatory Visit
Admission: RE | Admit: 2020-01-05 | Discharge: 2020-01-05 | Disposition: A | Discharge status: Home / Self Care | Payer: Medicare Other | Source: Ambulatory Visit | Attending: Radiation Oncology | Admitting: Radiation Oncology

## 2020-01-05 DIAGNOSIS — C61 Malignant neoplasm of prostate: Secondary | ICD-10-CM | POA: Diagnosis not present

## 2020-01-06 ENCOUNTER — Ambulatory Visit
Admission: RE | Admit: 2020-01-06 | Discharge: 2020-01-06 | Disposition: A | Discharge status: Home / Self Care | Payer: Medicare Other | Source: Ambulatory Visit | Attending: Radiation Oncology | Admitting: Radiation Oncology

## 2020-01-06 DIAGNOSIS — C61 Malignant neoplasm of prostate: Secondary | ICD-10-CM | POA: Diagnosis not present

## 2020-01-07 ENCOUNTER — Ambulatory Visit
Admission: RE | Admit: 2020-01-07 | Discharge: 2020-01-07 | Disposition: A | Discharge status: Home / Self Care | Payer: Medicare Other | Source: Ambulatory Visit | Attending: Radiation Oncology | Admitting: Radiation Oncology

## 2020-01-07 DIAGNOSIS — C61 Malignant neoplasm of prostate: Secondary | ICD-10-CM | POA: Diagnosis not present

## 2020-01-08 ENCOUNTER — Ambulatory Visit
Admission: RE | Admit: 2020-01-08 | Discharge: 2020-01-08 | Disposition: A | Discharge status: Home / Self Care | Payer: Medicare Other | Source: Ambulatory Visit | Attending: Radiation Oncology | Admitting: Radiation Oncology

## 2020-01-08 DIAGNOSIS — C61 Malignant neoplasm of prostate: Secondary | ICD-10-CM | POA: Diagnosis not present

## 2020-01-13 ENCOUNTER — Ambulatory Visit (INDEPENDENT_AMBULATORY_CARE_PROVIDER_SITE_OTHER): Payer: Medicare Other | Admitting: Urology

## 2020-01-13 ENCOUNTER — Other Ambulatory Visit: Payer: Self-pay

## 2020-01-13 ENCOUNTER — Encounter: Payer: Self-pay | Admitting: Urology

## 2020-01-13 VITALS — BP 160/79 | HR 66 | Ht 69.0 in | Wt 237.0 lb

## 2020-01-13 DIAGNOSIS — C61 Malignant neoplasm of prostate: Secondary | ICD-10-CM | POA: Diagnosis not present

## 2020-01-13 MED ORDER — LEUPROLIDE ACETATE (6 MONTH) 45 MG ~~LOC~~ KIT
45.0000 mg | PACK | Freq: Once | SUBCUTANEOUS | Status: AC
Start: 2020-01-13 — End: 2020-01-13
  Administered 2020-01-13: 45 mg via SUBCUTANEOUS

## 2020-01-13 NOTE — Progress Notes (Signed)
01/12/20 11:22 AM   Phillip Clements 1950-11-29 [id number]  Referring provider: Tonia Ghent, MD [address] [place],  [place] [address] Chief Complaint  Patient presents with  . Prostate Cancer    Urologic history: 1. Prostate adenocarcinoma  a. 02/2006 PSA returned elevated at 17.49  b. 04/03/2006; PSA 24.7; prostate biopsy Gleason 3+3 with 4/4 cores c. tx with HIFU overseas late 2007  d. persistent PSA elevation over period 2007-2010; nadir 2.1 09/16/06, peak 15.0 ng/mL  e. 04/2006 bone scan and CT abd/pelvis without evidence of metastasis f. 03/08/2007 CT abdomen/pelvis with no definitive metastatic disease noted  g. 03/18/2007 prostascint scan with no evidence of disease but small pelvic lymph nodes noted  h. 08/15/2009 prostascint scan with no evidence of local prostate cancer or distant metastases  i. 08/22/2009 PSA 15 ng/mL; 02/06/2010 PSA 20.5 ng/mL  j. 03/08/2010 PSA 20.9 ng/mL  k. 06/02/10 initiation of degarelix  l. 05/25/10 PET CT (Fluoride) revealed heterogenous areas of FDG-avidity in ribs, femurs, humeri, areas also in the metatarsals noted which may have been arthiritic in nature  m. 07/03/10 PSA 9.6 ng/mL  o.  Started Lupron/Casodex  HPI: Phillip Clements is a 69 y.o. male who presents today for a semiannual follow up.  - PSA 11/2019 was 0.9 -Completed radiation late May 2021, tolerated well with mild increased urinary frequency.  -Has radiation oncology appointment 02/2020 -States his schedule for his first PSA 3 months after completing treatment  PMH: Past Medical History:  Diagnosis Date  . Cataract    bilateral eye in 2010  . Coronary artery disease    a. CABG 04/2014: (LIMA->LAD, VG->DIAG, VG->OM2, VG->PDA)  b. 07/2014 early graft failure (VG->OM2 60, LIMA->LAD atretic, VG->PDA & VG->D1 100). s/p successful rotational atherectomy & DES of the LM & pLAD (3.0x24 Promus DES) & mLAD (2.25x24 Promus DES).  . CVA (cerebral infarction)   . Family history of  prostate cancer   . Hx of adenomatous colonic polyps 06/17/2017  . Hyperlipidemia    a. Statin intolerant.  . Hypertension 2000  . Moderate mitral regurgitation    a. 02/2015 Echo: EF 55-60%, no rwma, mod MR, mod BAE, mildly dil RV w/ low nl RV fxn.  . OSA on CPAP   . Peripheral vascular disease ([place])    a. left SFA angioplasty in December 2016  . Prostate cancer ([place])   . Sleep apnea    wears CPAP nightly  . Type II diabetes mellitus ([place])     Surgical History: Past Surgical History:  Procedure Laterality Date  . ANTERIOR CERVICAL DECOMP/DISCECTOMY FUSION  08/25/2012   Procedure: ANTERIOR CERVICAL DECOMPRESSION/DISCECTOMY FUSION 2 LEVELS;  Surgeon: Hosie Spangle, MD;  Location: [facility];  Service: Neurosurgery;  Laterality: N/A;  Cervical five-six Cervical six-seven anterior cervical decompression with fusion and plating and bonegraft  . BACK SURGERY    . CARDIAC CATHETERIZATION  04/2014; 07/19/2014  . CORONARY ARTERY BYPASS GRAFT N/A 04/26/2014   Procedure: CORONARY ARTERY BYPASS GRAFTING (CABG), on pump, times four, using left internal mammary artery, right greater saphenous vein harvested endoscopically.;  Surgeon: Ivin Poot, MD;  Location: [facility];  Service: Open Heart Surgery;  Laterality: N/A;  LIMA to LAD, SVG to DIAGONAL, SVG to OM1, SVG to PDA) with EVH of the RIGHT THIGH and LOWER EXTREMITY SAPHENOUS VEIN  . Cytoscopy prostatic stone o/w nml  06/21/08   Dr. Jacqlyn Larsen  . ETT myoview  09/13/09   Low risk EF 49%  .  High intense focused ultrasound  07/20/06   By Dr. Jacqlyn Larsen  . INTRAOPERATIVE TRANSESOPHAGEAL ECHOCARDIOGRAM N/A 04/26/2014   Procedure: INTRAOPERATIVE TRANSESOPHAGEAL ECHOCARDIOGRAM;  Surgeon: Ivin Poot, MD;  Location: [facility];  Service: Open Heart Surgery;  Laterality: N/A;  . PERCUTANEOUS CORONARY ROTOBLATOR INTERVENTION (PCI-R) N/A 07/22/2014   Procedure: PERCUTANEOUS CORONARY ROTOBLATOR INTERVENTION (PCI-R);  Surgeon: Peter M Martinique, MD;  Location[facility] CATH LAB;   Service: Cardiovascular;  Laterality: N/A;  . PERIPHERAL VASCULAR CATHETERIZATION Left 07/26/2015   Procedure: Lower Extremity Angiography;  Surgeon: Katha Cabal, MD;  Location: [facility] CV LAB;  Service: Cardiovascular;  Laterality: Left;  . PERIPHERAL VASCULAR CATHETERIZATION  07/26/2015   Procedure: Lower Extremity Intervention;  Surgeon: Katha Cabal, MD;  Location: [facility] CV LAB;  Service: Cardiovascular;;  . PROSTATE BIOPSY  04/03/06 & 02/25/07    Home Medications:  Allergies as of 01/13/2020      Reactions   Coreg [carvedilol] Other (See Comments)   Fatigue- unclear if this was due to coreg specifically or beta blockers in general.     Metformin And Related Nausea Only   Protonix [pantoprazole Sodium] Diarrhea   Rosuvastatin Calcium    REACTION: JOINT ACHES   Statins    REACTION: JOINT ACHES      Medication List       Accurate as of January 13, 2020 11:22 AM. If you have any questions, ask your nurse or doctor.        aspirin EC 81 MG tablet Take 1 tablet (81 mg total) by mouth daily.   canagliflozin 300 MG Tabs tablet Commonly known as: Invokana TAKE 1 TABLET BY MOUTH ONCE DAILY WITH BREAKFAST   carvedilol 3.125 MG tablet Commonly known as: COREG Take 1 tablet (3.125 mg total) by mouth 2 (two) times daily with a meal.   clopidogrel 75 MG tablet Commonly known as: PLAVIX Take 1 tablet (75 mg total) by mouth daily.   [place] Metal Pipetting Misc Frequency:PHARMDIR   Dosage:0.0     Instructions:  Note:diagnosis 250.02 Dose: NA   ezetimibe 10 MG tablet Commonly known as: ZETIA TAKE 1 TABLET BY MOUTH ONCE DAILY   Insulin Pen Needle 32G X 4 MM Misc Use 1x a day   leuprolide 30 MG injection Commonly known as: LUPRON Inject 45 mg into the muscle every 6 (six) months.   losartan 25 MG tablet Commonly known as: COZAAR TAKE 1/2 TABLET BY MOUTH ONCE A DAY   meclizine 25 MG tablet Commonly known as: ANTIVERT Take 0.5-1 tablets (12.5-25 mg  total) by mouth 2 (two) times daily as needed for dizziness or nausea.   nitroGLYCERIN 0.4 MG SL tablet Commonly known as: Nitrostat Place 1 tablet (0.4 mg total) under the tongue as needed.   OneTouch Ultra test strip Generic drug: glucose blood CHECK BLOOD SUGAR TWICE A DAY - DX 123456   Repatha SureClick XX123456 MG/ML Soaj Generic drug: Evolocumab INJECT ONE PEN INTO THE SKIN EVERY 14 DAYS   Tresiba FlexTouch 200 UNIT/ML FlexTouch Pen Generic drug: insulin degludec Inject 44 Units into the skin daily. Appointment is needed for refills   Trulicity 3 0000000 Sopn Generic drug: Dulaglutide Inject 3 mg into the skin once a week.       Allergies:  Allergies  Allergen Reactions  . Coreg [Carvedilol] Other (See Comments)    Fatigue- unclear if this was due to coreg specifically or beta blockers in general.    . Metformin And Related Nausea Only  .  Protonix [Pantoprazole Sodium] Diarrhea  . Rosuvastatin Calcium     REACTION: JOINT ACHES  . Statins     REACTION: JOINT ACHES    Family History: Family History  Problem Relation Age of Onset  . Diabetes Mother 73       DM  . Coronary artery disease Mother   . Hypertension Mother   . Heart attack Mother        CABG with MI in 2005  . Heart disease Mother        CV  . Hypertension Father   . Heart attack Father        CABG with Mi around 1997  . Coronary artery disease Father   . Heart disease Father        CV  . Diabetes Father   . Heart attack Brother        MI/PTCA  . Heart disease Brother        CV  . Diabetes Brother   . Heart attack Maternal Grandfather        MI  . Prostate cancer Paternal Grandfather 7       likely metastatic  . Diabetes Sister        DM  . Breast cancer Neg Hx        Breast/ovarian/uterine cancer  . Colon cancer Neg Hx   . Depression Neg Hx   . Alcohol abuse Neg Hx        ETOH/drug abuse  . Stroke Neg Hx     Social History:  reports that he quit smoking about 5 years ago. His  smoking use included cigarettes. He has a 36.00 pack-year smoking history. He has never used smokeless tobacco. He reports current alcohol use. He reports that he does not use drugs.   Physical Exam: There were no vitals taken for this visit.  Constitutional:  Alert and oriented, No acute distress. HEENT: [place] AT, moist mucus membranes.  Trachea midline, no masses. Cardiovascular: No clubbing, cyanosis, or edema. Respiratory: Normal respiratory effort, no increased work of breathing. Skin: No rashes, bruises or suspicious lesions. Neurologic: Grossly intact, no focal deficits, moving all 4 extremities. Psychiatric: Normal mood and affect.   Assessment & Plan:    1. Prostate Cancer -Status post salvage radiation -Keep radiation oncology follow up -Received leuprolide today  [facility] [address] [place], [place] [address] [phone]  I, Joneen Boers Peace, am acting as a Education administrator for Dr. Nicki Reaper C. Arissa Fagin.  I have reviewed the above documentation for accuracy and completeness, and I agree with the above.   Abbie Sons, MD

## 2020-01-13 NOTE — Progress Notes (Signed)
Eligard SubQ Injection   Due to Prostate Cancer patient is present today for a Eligard Injection.  Medication: Eligard 6 month Dose: 45 mg  Location: left  Lot: [id number] Exp: [id number]   Patient tolerated well, no complications were noted  Performed [id number] Zackory Pudlo CMA   Per Dr. Bernardo Heater  patient is to continue therapy for 6 months . Patient's next follow up was scheduled for 07/01/2020. This appointment was scheduled using wheel and given to patient today along with reminder continue on Vitamin D 800-1000iu and Calium 1000-1200mg  daily while on Androgen Deprivation Therapy.  PA approval dates:

## 2020-01-14 ENCOUNTER — Encounter: Payer: Self-pay | Admitting: Urology

## 2020-01-26 ENCOUNTER — Telehealth: Payer: Self-pay | Admitting: Genetic Counselor

## 2020-01-26 NOTE — Telephone Encounter (Signed)
LVM that his genetic test results are available and requested that he call back to discuss them.  

## 2020-01-28 ENCOUNTER — Other Ambulatory Visit: Payer: Self-pay | Admitting: Internal Medicine

## 2020-02-10 ENCOUNTER — Telehealth: Payer: Self-pay | Admitting: Genetic Counselor

## 2020-02-10 NOTE — Telephone Encounter (Signed)
LVM that his genetic test results are available and requested that he call back to discuss them.  

## 2020-02-22 ENCOUNTER — Other Ambulatory Visit: Payer: Self-pay | Admitting: Licensed Clinical Social Worker

## 2020-02-22 ENCOUNTER — Other Ambulatory Visit: Payer: Self-pay

## 2020-02-22 ENCOUNTER — Ambulatory Visit
Admission: RE | Admit: 2020-02-22 | Discharge: 2020-02-22 | Disposition: A | Discharge status: Home / Self Care | Payer: Medicare Other | Source: Ambulatory Visit | Attending: Radiation Oncology | Admitting: Radiation Oncology

## 2020-02-22 ENCOUNTER — Encounter: Payer: Self-pay | Admitting: Radiation Oncology

## 2020-02-22 VITALS — BP 139/65 | HR 64 | Temp 96.8°F | Wt 232.6 lb

## 2020-02-22 DIAGNOSIS — Z923 Personal history of irradiation: Secondary | ICD-10-CM | POA: Insufficient documentation

## 2020-02-22 DIAGNOSIS — C61 Malignant neoplasm of prostate: Secondary | ICD-10-CM | POA: Insufficient documentation

## 2020-02-22 MED ORDER — TAMSULOSIN HCL 0.4 MG PO CAPS: 0.4000 mg | ORAL_CAPSULE | Freq: Every day | ORAL | 1 refills | Status: DC

## 2020-02-22 NOTE — Progress Notes (Signed)
Radiation Oncology Follow up Note  Name: Phillip Clements   Date:   02/22/2020 MRN:  [id number] DOB: [birth year]    This 69 y.o. male presents to the clinic today for 1 month follow-up status post salvage radiation therapy after HIFU for Gleason 6 adenocarcinoma the prostate initially presenting with a PSA of 24.7.  REFERRING PROVIDER: Tonia Ghent, MD  HPI: Patient is a.  69 year old [place] in late 2007 for Gleason 6 adenocarcinoma presenting with a PSA of 24.7.  Interesting case he had several scans possibly showing metastatic disease although lumbar spine in 2018 of the MRI showed no evidence of metastatic disease and most other of his nuclear scans were negative.  He was treated with image guided radiation therapy to his prostate now seen at 1 month he does have some nocturia almost every 2 hours never been on medication for that.  Specifically denies any bowel problems.  COMPLICATIONS OF TREATMENT: none  FOLLOW UP COMPLIANCE: keeps appointments   PHYSICAL EXAM:  BP 139/65 (BP Location: Left Arm, Patient Position: Sitting, Cuff Size: Large)   Pulse 64   Temp (!) 96.8 F (36 C) (Tympanic)   Wt 232 lb 9 oz (105.5 kg)   BMI 34.34 kg/m  Well-developed well-nourished patient in NAD. HEENT reveals PERLA, EOMI, discs not visualized.  Oral cavity is clear. No oral mucosal lesions are identified. Neck is clear without evidence of cervical or supraclavicular adenopathy. Lungs are clear to A&P. Cardiac examination is essentially unremarkable with regular rate and rhythm without murmur rub or thrill. Abdomen is benign with no organomegaly or masses noted. Motor sensory and DTR levels are equal and symmetric in the upper and lower extremities. Cranial nerves II through XII are grossly intact. Proprioception is intact. No peripheral adenopathy or edema is identified. No motor or sensory levels are noted. Crude visual fields are within normal range.  RADIOLOGY RESULTS: No  current films to review  PLAN: Present time I am starting him on Flomax for his nocturia.  He will take that after dinner every night.  I am also asking him back in 3 months for follow-up with a PSA at that time.  He otherwise continues to do well very low side effect profile.  I have explained to him the HIFU and radiation will cause some residual increased lower urinary tract symptoms.  He is well aware of that.  Patient will return as planned.  I would like to take this opportunity to thank you for allowing me to participate in the care of your patient.Noreene Filbert, MD

## 2020-02-24 ENCOUNTER — Other Ambulatory Visit: Payer: Self-pay | Admitting: *Deleted

## 2020-02-24 DIAGNOSIS — C61 Malignant neoplasm of prostate: Secondary | ICD-10-CM

## 2020-02-25 ENCOUNTER — Encounter: Payer: Self-pay | Admitting: Internal Medicine

## 2020-02-25 ENCOUNTER — Other Ambulatory Visit: Payer: Self-pay

## 2020-02-25 ENCOUNTER — Ambulatory Visit (INDEPENDENT_AMBULATORY_CARE_PROVIDER_SITE_OTHER): Payer: Medicare Other | Admitting: Internal Medicine

## 2020-02-25 VITALS — BP 138/80 | HR 64 | Ht 69.0 in | Wt 233.0 lb

## 2020-02-25 DIAGNOSIS — E785 Hyperlipidemia, unspecified: Secondary | ICD-10-CM | POA: Diagnosis not present

## 2020-02-25 DIAGNOSIS — E1159 Type 2 diabetes mellitus with other circulatory complications: Secondary | ICD-10-CM | POA: Diagnosis not present

## 2020-02-25 DIAGNOSIS — E1165 Type 2 diabetes mellitus with hyperglycemia: Secondary | ICD-10-CM | POA: Diagnosis not present

## 2020-02-25 DIAGNOSIS — I2510 Atherosclerotic heart disease of native coronary artery without angina pectoris: Secondary | ICD-10-CM

## 2020-02-25 DIAGNOSIS — Z6839 Body mass index (BMI) 39.0-39.9, adult: Secondary | ICD-10-CM | POA: Diagnosis not present

## 2020-02-25 LAB — POCT GLYCOSYLATED HEMOGLOBIN (HGB A1C): Hemoglobin A1C: 8.2 % — AB (ref 4.0–5.6)

## 2020-02-25 MED ORDER — TRESIBA FLEXTOUCH 200 UNIT/ML ~~LOC~~ SOPN: 44.0000 [IU] | PEN_INJECTOR | Freq: Every day | SUBCUTANEOUS | 3 refills | Status: DC

## 2020-02-25 NOTE — Patient Instructions (Addendum)
Please continue: - Invokana  300 mg bfore b'fast - Trulicity 3 mg weekly.  Please restart: - Tresiba 26 units daily (may need to increase by 4-8 units every 4 days until sugars in am <130)  Please return in 4 months with your sugar log.

## 2020-02-25 NOTE — Addendum Note (Signed)
Addended by: Cardell Peach I on: 02/25/2020 11:40 AM   Modules accepted: Orders

## 2020-02-25 NOTE — Progress Notes (Signed)
Patient ID: Phillip Clements, male   DOB: [birth year], 69 y.o.   MRN: [id number]  This visit occurred during the SARS-CoV-2 public health emergency.  Safety protocols were in place, including screening questions prior to the visit, additional usage of staff PPE, and extensive cleaning of exam room while observing appropriate contact time as indicated for disinfecting solutions.   HPI: Phillip Clements is a 69 y.o.-year-old male, returning for f/u for DM2, dx in 1996, insulin-dependent since 2008, uncontrolled, with complications (CAD - s/p stents, CABG, cerebro-vascular ds - s/p CVA, PVD). Last visit 8 months ago. He changed to [facility] in 10/2016.  At last visit, sugars are higher, as he relaxed his diet.  Since then, he stopped checking, as he did not have testers.  He just restarted last night.  Since last visit, he ran out of [place].  Reviewed HbA1c levels: Lab Results  Component Value Date   HGBA1C 7.8 (A) 06/26/2019   HGBA1C 7.2 (A) 02/19/2019   HGBA1C 7.8 (A) 10/30/2018   Pt was on a regimen of: - Invokana 300 mg before b'fast - Trulicity 1.5 mg weekly - Novolog 40 units 3x a day - 15 min before meals - Toujeo 120  units (60 x2) after dinner He tried Victoza and Januvia. Had GI intolerance (nausea, diarrhea) to regular Metformin and also with metformin ER >>stopped.  He was then on: - Invokana 300 mg before breakfast - Trulicity 1.5 mg weekly - U500 insulin 55-70 units 3 times a day before meals Also, if sugars in the morning are: - 160-200, take 10 units - 201-240, take 15 units - >240, take 20 units  He is currently on: - Invokana 300 mg bfore b'fast - Trulicity 1.5 >> 3 mg weekly. - Tresiba 32 >> 44 units daily-started 10/2018 >> stopped about 1 month ago as he ran out and could not refill  He is checking sugars once a day: - am: 200 >> 126-170, 209, 214 >> 119, 150-200 >> n/c - 2h after b'fast: n/c - not eating b'fast  - before lunch: 100-120 >> n/c >> 126-166 >>  n/c - 2h after lunch: n/c - before dinner: 100-120 >> 160s >> 150 >> n/c - 2h after dinner: n/c >> 280 - bedtime: 201, 214 >> 128-150 >> n/c - nighttime: n/c Lowest sugar was 56 (on insulin) >> ...>> 119 >> ?; he has hypoglycemia awareness in the 70s. Highest sugar was  445 >> ... >> 237 >> 280.  Glucometer: OneTouch Ultra mini  Pt's meals are mostly plant-based: - Breakfast:  skips - Lunch: sandwich, chips, cottage cheese, pineapple - Dinner: 2 veggies, salads; Sat night: meat - Snacks: 2/day: celery + PB; low salt triscuit + laughing cow cheese  He continues to walk on the treadmill 2 out of 7 days and working outside.  + Mild CKD, last BUN/creatinine:  Lab Results  Component Value Date   BUN 23 10/09/2019   CREATININE 0.91 10/09/2019  On losartan. -+ HL; l+ HLet of lipids: Lab Results  Component Value Date   CHOL 166 10/09/2019   HDL 42 10/09/2019   LDLCALC 96 10/09/2019   LDLDIRECT 157.1 09/07/2013   TRIG 138 10/09/2019   CHOLHDL 4.0 10/09/2019  He is intolerant to statins due to joint pains.  He continues on Repatha and Zetia. - last eye exam was in 05/2019: No DR; + history of cataract surgery. - no Numbness and tingling in his feet.  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  On Lupron.  He finished radiation therapy.  ROS: Constitutional: no weight gain/no weight loss, no fatigue, no subjective hyperthermia, no subjective hypothermia Eyes: no blurry vision, no xerophthalmia ENT: no sore throat, no nodules palpated in neck, no dysphagia, no odynophagia, no hoarseness Cardiovascular: no CP/no SOB/no palpitations/no leg swelling Respiratory: no cough/no SOB/no wheezing Gastrointestinal: no N/no V/no D/no C/no acid reflux Musculoskeletal: no muscle aches/no joint aches Skin: no rashes, no hair loss Neurological: no tremors/no numbness/no tingling/no dizziness  I reviewed pt's medications, allergies, PMH, social hx, family hx, and changes were documented in  the history of present illness. Otherwise, unchanged from my initial visit note.  Past Medical History:  Diagnosis Date  . Cataract    bilateral eye in 2010  . Coronary artery disease    a. CABG 04/2014: (LIMA->LAD, VG->DIAG, VG->OM2, VG->PDA)  b. 07/2014 early graft failure (VG->OM2 60, LIMA->LAD atretic, VG->PDA & VG->D1 100). s/p successful rotational atherectomy & DES of the LM & pLAD (3.0x24 Promus DES) & mLAD (2.25x24 Promus DES).  . CVA (cerebral infarction)   . Family history of prostate cancer   . Hx of adenomatous colonic polyps 06/17/2017  . Hyperlipidemia    a. Statin intolerant.  . Hypertension 2000  . Moderate mitral regurgitation    a. 02/2015 Echo: EF 55-60%, no rwma, mod MR, mod BAE, mildly dil RV w/ low nl RV fxn.  . OSA on CPAP   . Peripheral vascular disease ([place])    a. left SFA angioplasty in December 2016  . Prostate cancer ([place])   . Sleep apnea    wears CPAP nightly  . Type II diabetes mellitus ([place])    Past Surgical History:  Procedure Laterality Date  . ANTERIOR CERVICAL DECOMP/DISCECTOMY FUSION  08/25/2012   Procedure: ANTERIOR CERVICAL DECOMPRESSION/DISCECTOMY FUSION 2 LEVELS;  Surgeon: Hosie Spangle, MD;  Location: [place] [facility];  Service: Neurosurgery;  Laterality: N/A;  Cervical five-six Cervical six-seven anterior cervical decompression with fusion and plating and bonegraft  . BACK SURGERY    . CARDIAC CATHETERIZATION  04/2014; 07/19/2014  . CORONARY ARTERY BYPASS GRAFT N/A 04/26/2014   Procedure: CORONARY ARTERY BYPASS GRAFTING (CABG), on pump, times four, using left internal mammary artery, right greater saphenous vein harvested endoscopically.;  Surgeon: Ivin Poot, MD;  Location: [place];  Service: Open Heart Surgery;  Laterality: N/A;  LIMA to LAD, SVG to DIAGONAL, SVG to OM1, SVG to PDA) with EVH of the RIGHT THIGH and LOWER EXTREMITY SAPHENOUS VEIN  . Cytoscopy prostatic stone o/w nml  06/21/08   Dr. Jacqlyn Larsen  . ETT myoview  09/13/09   Low risk EF 49%   . High intense focused ultrasound  07/20/06   By Dr. Jacqlyn Larsen  . INTRAOPERATIVE TRANSESOPHAGEAL ECHOCARDIOGRAM N/A 04/26/2014   Procedure: INTRAOPERATIVE TRANSESOPHAGEAL ECHOCARDIOGRAM;  Surgeon: Ivin Poot, MD;  Location: [place];  Service: Open Heart Surgery;  Laterality: N/A;  . PERCUTANEOUS CORONARY ROTOBLATOR INTERVENTION (PCI-R) N/A 07/22/2014   Procedure: PERCUTANEOUS CORONARY ROTOBLATOR INTERVENTION (PCI-R);  Surgeon: Peter M Martinique, MD;  Location[facility];  Service: Cardiovascular;  Laterality: N/A;  . PERIPHERAL VASCULAR CATHETERIZATION Left 07/26/2015   Procedure: Lower Extremity Angiography;  Surgeon: Katha Cabal, MD;  Location: [place] CV LAB;  Service: Cardiovascular;  Laterality: Left;  . PERIPHERAL VASCULAR CATHETERIZATION  07/26/2015   Procedure: Lower Extremity Intervention;  Surgeon: Katha Cabal, MD;  Location: [place] CV LAB;  Service: Cardiovascular;;  . PROSTATE BIOPSY  04/03/06 & 02/25/07  Social History   Socioeconomic History  . Marital status: Married    Spouse name: Not on file  . Number of children: 3  . Years of education: Not on file  . Highest education level: Not on file  Occupational History  . Occupation: Insurance account manager  Tobacco Use  . Smoking status: Former Smoker    Packs/day: 1.00    Years: 36.00    Pack years: 36.00    Types: Cigarettes    Quit date: 03/01/2014    Years since quitting: 5.9  . Smokeless tobacco: Never Used  Vaping Use  . Vaping Use: Never used  Substance and Sexual Activity  . Alcohol use: Yes    Comment: 07/19/2014 "might have a drink a couple times/yr"  . Drug use: No  . Sexual activity: Not Currently  Other Topics Concern  . 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 In  [place].    Social Determinants of Health   Financial Resource Strain:   . Difficulty of Paying Living Expenses:   Food Insecurity:   . Worried About Charity fundraiser in the Last Year:   . Arboriculturist in the Last Year:   Transportation Needs:   . Film/video editor (Medical):   Marland Kitchen Lack of Transportation (Non-Medical):   Physical Activity:   . Days of Exercise per Week:   . Minutes of Exercise per Session:   Stress:   . Feeling of Stress :   Social Connections:   . Frequency of Communication with Friends and Family:   . Frequency of Social Gatherings with Friends and Family:   . Attends Religious Services:   . Active Member of Clubs or Organizations:   . Attends Archivist Meetings:   Marland Kitchen Marital Status:   Intimate Partner Violence:   . Fear of Current or Ex-Partner:   . Emotionally Abused:   Marland Kitchen Physically Abused:   . Sexually Abused:    Current Outpatient Medications on File Prior to Visit  Medication Sig Dispense Refill  . aspirin EC 81 MG tablet Take 1 tablet (81 mg total) by mouth daily. 90 tablet 3  . canagliflozin (INVOKANA) 300 MG TABS tablet TAKE 1 TABLET BY MOUTH ONCE DAILY WITH BREAKFAST 90 tablet 3  . carvedilol (COREG) 3.125 MG tablet Take 1 tablet (3.125 mg total) by mouth 2 (two) times daily with a meal. 180 tablet 0  . clopidogrel (PLAVIX) 75 MG tablet Take 1 tablet (75 mg total) by mouth daily. 30 tablet 4  . Dulaglutide (TRULICITY) 3 WY/6.3ZC SOPN Inject 3 mg into the skin once a week. 12 pen 3  . ezetimibe (ZETIA) 10 MG tablet TAKE 1 TABLET BY MOUTH ONCE DAILY 30 tablet 5  . glucose blood (ONETOUCH ULTRA) test strip CHECK BLOOD SUGAR TWICE A DAY - DX E11.59 200 strip 3  . Injection Device ([place]) MISC Frequency:PHARMDIR   Dosage:0.0     Instructions:  Note:diagnosis 250.02 Dose: NA    . insulin degludec (TRESIBA FLEXTOUCH) 200 UNIT/ML FlexTouch Pen Inject 44 Units into the skin daily. Appointment is needed for refills 9  mL 1  . Insulin Pen Needle 32G X 4 MM MISC Use 1x a day 100 each 3  . leuprolide (LUPRON) 30 MG injection Inject 45 mg  into the muscle every 6 (six) months.    Marland Kitchen losartan (COZAAR) 25 MG tablet TAKE 1/2 TABLET BY MOUTH ONCE A DAY 15 tablet 5  . meclizine (ANTIVERT) 25 MG tablet Take 0.5-1 tablets (12.5-25 mg total) by mouth 2 (two) times daily as needed for dizziness or nausea. 30 tablet 0  . nitroGLYCERIN (NITROSTAT) 0.4 MG SL tablet Place 1 tablet (0.4 mg total) under the tongue as needed. 25 tablet 3  . REPATHA SURECLICK 073 MG/ML SOAJ INJECT ONE PEN INTO THE SKIN EVERY 14 DAYS 2 mL 3  . tamsulosin (FLOMAX) 0.4 MG CAPS capsule Take 1 capsule (0.4 mg total) by mouth daily after supper. 30 capsule 1   No current facility-administered medications on file prior to visit.   Allergies  Allergen Reactions  . Coreg [Carvedilol] Other (See Comments)    Fatigue- unclear if this was due to coreg specifically or beta blockers in general.    . Metformin And Related Nausea Only  . Protonix [Pantoprazole Sodium] Diarrhea  . Rosuvastatin Calcium     REACTION: JOINT ACHES  . Statins     REACTION: JOINT ACHES   Family History  Problem Relation Age of Onset  . Diabetes Mother 12       DM  . Coronary artery disease Mother   . Hypertension Mother   . Heart attack Mother        CABG with MI in 2005  . Heart disease Mother        CV  . Hypertension Father   . Heart attack Father        CABG with Mi around 1997  . Coronary artery disease Father   . Heart disease Father        CV  . Diabetes Father   . Heart attack Brother        MI/PTCA  . Heart disease Brother        CV  . Diabetes Brother   . Heart attack Maternal Grandfather        MI  . Prostate cancer Paternal Grandfather 55       likely metastatic  . Diabetes Sister        DM  . Breast cancer Neg Hx        Breast/ovarian/uterine cancer  . Colon cancer Neg Hx   . Depression Neg Hx   . Alcohol abuse Neg Hx        ETOH/drug abuse   . Stroke Neg Hx    PE: BP 138/80   Pulse 64   Ht 5\' 9"  (1.753 m)   Wt 233 lb (105.7 kg)   SpO2 98%   BMI 34.41 kg/m  Body mass index is 34.41 kg/m. Wt Readings from Last 3 Encounters:  02/25/20 233 lb (105.7 kg)  02/22/20 232 lb 9 oz (105.5 kg)  01/13/20 237 lb (107.5 kg)   Constitutional: overweight, in NAD Eyes: PERRLA, EOMI, no exophthalmos ENT: moist mucous membranes, no thyromegaly, no cervical lymphadenopathy Cardiovascular: RRR, No MRG Respiratory: CTA B Gastrointestinal: abdomen soft, NT, ND, BS+ Musculoskeletal: no deformities, strength intact in all 4 Skin: moist, warm, no rashes Neurological: no tremor with outstretched hands, DTR normal in all 4  ASSESSMENT: 1. DM2, insulin-dependent, uncontrolled, with complications - CAD - s/p stents, CABG - cerebro-vascular ds - s/p CVA  - PVD - s/p L stent  2. HL  3.  Obesity class II BMI Classification:  < 18.5 underweight   18.5-24.9 normal weight   25.0-29.9  overweight   30.0-34.9 class I obesity   35.0-39.9 class II obesity   ? 40.0 class III obesity   PLAN:  1. Patient with longstanding, insulin resistant diabetes, previously on U500 insulin and insulin after sugars improved on Invokana and Trulicity, but now on long-acting insulin.  He returns after an absence of 8 months.  In the meantime, he has been undergoing radiation therapy for prostate cancer.  He finished this in 12/2019. -At last visit, he relaxed his diet and sugars are higher but he was only checking sugars in the morning and I advised him to try to check during the day, also.  HbA1c was higher, at 7.8%.  We increased his Trulicity dose from 1.5 to 3 mg weekly but continues the same dose of [place]. -At this visit, we do not have blood sugars to go by, as he was not checking due to problems with the stress.  He just restarted to check last night, after dinner, and the CBG was 280.  Of note, he was off [place] for the last month as he  could not refill it.  At this visit, without more data, I cannot make any changes in his regimen but we do need to restart his insulin.  He is very active outside, so he may not need the entire dose of [place] taken before, but will start at 26 units and I advised him to increase as needed.  He absolutely needs to start checking his sugars regularly and he is determined to do so - I suggested to: Patient Instructions  Please continue: - Invokana  300 mg bfore b'fast - Trulicity 3 mg weekly.  Please restart: - Tresiba 26 units daily (may need to increase by 4-8 units every 4 days until sugars in am <130)  Please return in 4 months with your sugar log.   - we checked his HbA1c: 8.2% (higher) - advised to check sugars at different times of the day - 1x a day, rotating check times - advised for yearly eye exams >> he is UTD - return to clinic in 4 months  2. HL -Reviewed latest lipid panel from 09/2019: LDL above target of less than 70 due to CAD, the rest of the fractions at goal: Lab Results  Component Value Date   CHOL 166 10/09/2019   HDL 42 10/09/2019   LDLCALC 96 10/09/2019   LDLDIRECT 157.1 09/07/2013   TRIG 138 10/09/2019   CHOLHDL 4.0 10/09/2019  -She is intolerant to statins-continues Repatha and ezetimibe without side effects  3. Obesity -Also contributed to by the Lupron injections -In the past, he lost a significant amount of weight (35 pounds) on keto diet.  Before our last visit in 06/2019, he gained 8 pounds, but he lost ~8 pounds since then. -continue SGLT 2 inhibitor and GLP-1 receptor agonist which should also help with weight loss.  Unfortunately, we will need to restart his insulin, which is weight inducing.  Philemon Kingdom, MD PhD [facility] Endocrinology

## 2020-02-26 ENCOUNTER — Other Ambulatory Visit: Payer: Self-pay | Admitting: Cardiovascular Disease

## 2020-02-26 ENCOUNTER — Encounter: Payer: Self-pay | Admitting: Genetic Counselor

## 2020-02-26 ENCOUNTER — Telehealth: Payer: Self-pay | Admitting: Genetic Counselor

## 2020-02-26 DIAGNOSIS — R079 Chest pain, unspecified: Secondary | ICD-10-CM

## 2020-02-26 DIAGNOSIS — E785 Hyperlipidemia, unspecified: Secondary | ICD-10-CM

## 2020-02-26 DIAGNOSIS — I2510 Atherosclerotic heart disease of native coronary artery without angina pectoris: Secondary | ICD-10-CM

## 2020-02-26 DIAGNOSIS — I10 Essential (primary) hypertension: Secondary | ICD-10-CM

## 2020-02-26 MED ORDER — CARVEDILOL 3.125 MG PO TABS: 3.1250 mg | ORAL_TABLET | Freq: Two times a day (BID) | ORAL | 0 refills | Status: DC

## 2020-02-26 NOTE — Telephone Encounter (Signed)
Third attempted call to review his genetic test results. LVM requesting that he call back to discuss these results. We will also send a letter with our contact information.

## 2020-02-26 NOTE — Telephone Encounter (Signed)
*  STAT* If patient is at the pharmacy, call can be transferred to refill team.   1. Which medications need to be refilled? (please list name of each medication and dose if known)   Coreg 3.125 mg po BID   2. Which pharmacy/location (including street and city if local pharmacy) is medication to be sent to?  [place]   3. Do they need a 30 day or 90 day supply? [place]

## 2020-02-26 NOTE — Telephone Encounter (Signed)
Requested Prescriptions   Signed Prescriptions Disp Refills   carvedilol (COREG) 3.125 MG tablet 180 tablet 0    Sig: Take 1 tablet (3.125 mg total) by mouth 2 (two) times daily with a meal.    Authorizing Provider: [name] A    Ordering User: Raelene Bott, Amilia Vandenbrink L

## 2020-03-01 ENCOUNTER — Telehealth: Payer: Self-pay | Admitting: Genetic Counselor

## 2020-03-01 NOTE — Telephone Encounter (Signed)
Returned Phillip Clements call requesting to come in Friday morning with his wife to receive his results. LVM that he is welcome to stop by before 8:30am or after 10am.

## 2020-03-04 ENCOUNTER — Telehealth: Payer: Self-pay | Admitting: Genetic Counselor

## 2020-03-04 ENCOUNTER — Encounter: Payer: Self-pay | Admitting: Genetic Counselor

## 2020-03-04 ENCOUNTER — Ambulatory Visit: Payer: Self-pay | Admitting: Genetic Counselor

## 2020-03-04 DIAGNOSIS — Z1379 Encounter for other screening for genetic and chromosomal anomalies: Secondary | ICD-10-CM | POA: Insufficient documentation

## 2020-03-04 NOTE — Progress Notes (Signed)
HPI:  Phillip Clements was previously seen in the [place] clinic due to a personal and family history of cancer and concerns regarding a hereditary predisposition to cancer. Please refer to our prior cancer genetics clinic note for more information regarding our discussion, assessment and recommendations, at the time. Phillip Clements recent genetic test results were disclosed to him, as were recommendations warranted by these results. These results and recommendations are discussed in more detail below.  CANCER HISTORY:  Oncology History Overview Note  1. Prostate adenocarcinoma  a. 02/2006 PSA returned elevated at 17.49  b. 04/03/2006; PSA 24.7; prostate biopsy Gleason 3+3 with 4/4 cores c. tx with HIFU overseas late 2007 in [place] [Dr.Cope]] d. persistent PSA elevation over period 2007-2010; nadir 2.1 09/16/06, peak 15.0 ng/mL  e. 04/2006 bone scan and CT abd/pelvis without evidence of metastasis f. 03/08/2007 CT abdomen/pelvis with no definitive metastatic disease noted  g. 03/18/2007 prostascint scan with no evidence of disease but small pelvic lymph nodes noted  h. 08/15/2009 prostascint scan with no evidence of local prostate cancer or distant metastases  i. 08/22/2009 PSA 15 ng/mL; 02/06/2010 PSA 20.5 ng/mL  j. 03/08/2010 PSA 20.9 ng/mL  k. 06/02/10 initiation of degarelix  l. 05/25/10 PET CT (Fluoride) revealed heterogenous areas of FDG-avidity in ribs, femurs, humeri, areas also in the metatarsals noted which may have been arthiritic in nature  m. 07/03/10 PSA 9.6 ng/mL  o.  Started Lupron/Casodex; June 2020-PSA from 0.2 to 0.3 -I;  As per patient-Casodex was withdrawn-but noted to have a jump of PSA to 0.9. FEB 2021-evidence of prostatic malignancy in the residual prostate bed; no distant metastatic disease  #comorbidities-CAD s/p CABG; diabetes; PVD  # NGS/MOLECULAR TESTS:NA  # PALLIATIVE CARE EVALUATION: NA  # PAIN MANAGEMENT: NA   DIAGNOSIS:   STAGE:         ;   GOALS:  CURRENT/MOST RECENT THERAPY :     Prostate cancer ([place])  09/16/2007 Initial Diagnosis   Prostate cancer ([place])   01/22/2020 Genetic Testing   Negative genetic testing:  No pathogenic variants detected on the [facility] Multi-Cancer panel. The report date is 01/22/2020.   The Common Hereditary Cancers Panel offered by [facility] includes sequencing and/or deletion duplication testing of the following 48 genes: APC, ATM, AXIN2, BARD1, BMPR1A, BRCA1, BRCA2, BRIP1, CDH1, CDK4, CDKN2A (p14ARF), CDKN2A (p16INK4a), CHEK2, CTNNA1, DICER1, EPCAM (Deletion/duplication testing only), GREM1 (promoter region deletion/duplication testing only), KIT, MEN1, MLH1, MSH2, MSH3, MSH6, MUTYH, NBN, NF1, NTHL1, PALB2, PDGFRA, PMS2, POLD1, POLE, PTEN, RAD50, RAD51C, RAD51D, RNF43, SDHB, SDHC, SDHD, SMAD4, SMARCA4. STK11, TP53, TSC1, TSC2, and VHL.  The following genes were evaluated for sequence changes only: SDHA and HOXB13 c.251G>A variant only.      FAMILY HISTORY:  We obtained a detailed, 4-generation family history.  Significant diagnoses are listed below: Family History  Problem Relation Age of Onset  . Diabetes Mother 87       DM  . Coronary artery disease Mother   . Hypertension Mother   . Heart attack Mother        CABG with MI in 2005  . Heart disease Mother        CV  . Hypertension Father   . Heart attack Father        CABG with Mi around 1997  . Coronary artery disease Father   . Heart disease Father        CV  . Diabetes Father   . Heart attack Brother  MI/PTCA  . Heart disease Brother        CV  . Diabetes Brother   . Heart attack Maternal Grandfather        MI  . Prostate cancer Paternal Grandfather 60       likely metastatic  . Diabetes Sister        DM  . Breast cancer Neg Hx        Breast/ovarian/uterine cancer  . Colon cancer Neg Hx   . Depression Neg Hx   . Alcohol abuse Neg Hx        ETOH/drug abuse  . Stroke Neg Hx    Phillip Clements has a son who is 59 and two  daughters in their 43s. He has two sisters (ages 52 and 65) and one brother (age 21), none of whom have had cancer.  Phillip Clements mother died at age 34 and did not have a history of cancer. He had four maternal uncles, all of whom died when they were older than 45. Phillip Clements maternal grandmother died at age 48, and his grandfather died at age 36 from heart disease. There are no known diagnoses of cancer on the maternal side of the family.  Phillip Clements father died at age 68 and did not have a history of cancer. Phillip Clements has two paternal aunts who are currently living at ages 25 and 41 and have not had cancer. His paternal grandmother died at age 48 from an aneurysm, and his paternal grandfather died at age 43 with prostate cancer, which was initially diagnosed around age 59. Phillip Clements believes that his grandfather's prostate cancer was likely metastatic.  Phillip Clements is unaware of previous family history of genetic testing for hereditary cancer risks. Patient's maternal ancestors are of [place] descent, and paternal ancestors are of [place] descent. There is reported Jewish ancestry on his paternal side, although Phillip Clements is unsure if it was Ashkenazi Jewish ancestry. There is no known consanguinity.  GENETIC TEST RESULTS: Genetic testing reported out on 01/22/2020 through the [facility] Common Hereditary Cancers panel. No pathogenic variants were detected.   The Common Hereditary Cancers Panel offered by [facility] includes sequencing and/or deletion duplication testing of the following 48 genes: APC, ATM, AXIN2, BARD1, BMPR1A, BRCA1, BRCA2, BRIP1, CDH1, CDK4, CDKN2A (p14ARF), CDKN2A (p16INK4a), CHEK2, CTNNA1, DICER1, EPCAM (Deletion/duplication testing only), GREM1 (promoter region deletion/duplication testing only), KIT, MEN1, MLH1, MSH2, MSH3, MSH6, MUTYH, NBN, NF1, NTHL1, PALB2, PDGFRA, PMS2, POLD1, POLE, PTEN, RAD50, RAD51C, RAD51D, RNF43, SDHB, SDHC, SDHD,  SMAD4, SMARCA4. STK11, TP53, TSC1, TSC2, and VHL.  The following genes were evaluated for sequence changes only: SDHA and HOXB13 c.251G>A variant only. The test report will be scanned into EPIC and located under the Molecular Pathology section of the Results Review tab.  A portion of the result report is included below for reference.     We discussed with Phillip Clements that because current genetic testing is not perfect, it is possible there may be a gene mutation in one of these genes that current testing cannot detect, but that chance is small.  We also discussed that there could be another gene that has not yet been discovered, or that we have not yet tested, that is responsible for the cancer diagnoses in the family. It is also possible there is a hereditary cause for the cancer in the family that Phillip Clements did not inherit and therefore was not identified in his testing.  Therefore, it is important to  remain in touch with cancer genetics in the future so that we can continue to offer Phillip Clements the most up to date genetic testing.   CANCER SCREENING RECOMMENDATIONS: Phillip Clements test result is considered negative (normal).  This means that we have not identified a hereditary cause for his personal and family history of cancer at this time. While reassuring, this does not definitively rule out a hereditary predisposition to cancer. It is still possible that there could be genetic mutations that are undetectable by current technology. There could be genetic mutations in genes that have not been tested or identified to increase cancer risk.  Therefore, it is recommended he continue to follow the cancer management and screening guidelines provided by his oncology and primary healthcare provider.   An individual's cancer risk and medical management are not determined by genetic test results alone. Overall cancer risk assessment incorporates additional factors, including personal medical history, family  history, and any available genetic information that may result in a personalized plan for cancer prevention and surveillance.  RECOMMENDATIONS FOR FAMILY MEMBERS:  Individuals in this family might be at some increased risk of developing cancer, over the general population risk, simply due to the family history of cancer.  We recommended women in this family have a yearly mammogram beginning at age 68, or 79 years younger than the earliest onset of cancer, an annual clinical breast exam, and perform monthly breast self-exams. Women in this family should also have a gynecological exam as recommended by their primary provider. All family members should be referred for colonoscopy starting at age 65.  FOLLOW-UP: Lastly, we discussed with Phillip Clements that cancer genetics is a rapidly advancing field and it is possible that new genetic tests will be appropriate for him and/or his family members in the future. We encouraged him to remain in contact with cancer genetics on an annual basis so we can update his personal and family histories and let him know of advances in cancer genetics that may benefit this family.   Our contact number was provided. Phillip Clements questions were answered to his satisfaction, and he knows he is welcome to call us at anytime with additional questions or concerns.   Clint Guy, MS, [facility] Genetic Counselor [email or web address] Phone: [phone]

## 2020-03-04 NOTE — Telephone Encounter (Signed)
Revealed negative genetic testing. Discussed that we do not know why he has prostate cancer or why there is cancer in the family. It is possible that there could be a mutation in a different gene that we are not testing, or our current technology may not be able detect certain mutations. It will therefore be important for him to stay in contact with genetics to keep up with whether additional testing may be appropriate in the future.     

## 2020-03-17 ENCOUNTER — Inpatient Hospital Stay: Payer: Medicare Other | Attending: Internal Medicine

## 2020-03-17 ENCOUNTER — Inpatient Hospital Stay (HOSPITAL_BASED_OUTPATIENT_CLINIC_OR_DEPARTMENT_OTHER): Payer: Medicare Other | Admitting: Internal Medicine

## 2020-03-17 ENCOUNTER — Other Ambulatory Visit: Payer: Self-pay

## 2020-03-17 VITALS — BP 142/71 | HR 66 | Temp 96.5°F | Wt 235.4 lb

## 2020-03-17 DIAGNOSIS — Z79899 Other long term (current) drug therapy: Secondary | ICD-10-CM | POA: Diagnosis not present

## 2020-03-17 DIAGNOSIS — R350 Frequency of micturition: Secondary | ICD-10-CM | POA: Insufficient documentation

## 2020-03-17 DIAGNOSIS — C7951 Secondary malignant neoplasm of bone: Secondary | ICD-10-CM | POA: Insufficient documentation

## 2020-03-17 DIAGNOSIS — R300 Dysuria: Secondary | ICD-10-CM | POA: Diagnosis not present

## 2020-03-17 DIAGNOSIS — Z87891 Personal history of nicotine dependence: Secondary | ICD-10-CM | POA: Insufficient documentation

## 2020-03-17 DIAGNOSIS — C61 Malignant neoplasm of prostate: Secondary | ICD-10-CM | POA: Insufficient documentation

## 2020-03-17 DIAGNOSIS — I10 Essential (primary) hypertension: Secondary | ICD-10-CM | POA: Diagnosis not present

## 2020-03-17 DIAGNOSIS — I2510 Atherosclerotic heart disease of native coronary artery without angina pectoris: Secondary | ICD-10-CM

## 2020-03-17 DIAGNOSIS — E119 Type 2 diabetes mellitus without complications: Secondary | ICD-10-CM | POA: Diagnosis not present

## 2020-03-17 LAB — URINALYSIS, COMPLETE (UACMP) WITH MICROSCOPIC
Bacteria, UA: NONE SEEN
Bilirubin Urine: NEGATIVE
Glucose, UA: >=500 mg/dL — AB
Hgb urine dipstick: NEGATIVE
Ketones, ur: NEGATIVE mg/dL
Leukocytes,Ua: NEGATIVE
Nitrite: NEGATIVE
Protein, ur: NEGATIVE mg/dL
Specific Gravity, Urine: 1.022 (ref 1.005–1.030)
pH: 5 (ref 5.0–8.0)

## 2020-03-17 LAB — COMPREHENSIVE METABOLIC PANEL
ALT: 18 U/L (ref 0–44)
AST: 14 U/L — ABNORMAL LOW (ref 15–41)
Albumin: 3.9 g/dL (ref 3.5–5.0)
Alkaline Phosphatase: 65 U/L (ref 38–126)
Anion gap: 9 (ref 5–15)
BUN: 17 mg/dL (ref 8–23)
CO2: 24 mmol/L (ref 22–32)
Calcium: 8.6 mg/dL — ABNORMAL LOW (ref 8.9–10.3)
Chloride: 102 mmol/L (ref 98–111)
Creatinine, Ser: 0.69 mg/dL (ref 0.61–1.24)
GFR calc Af Amer: >60 mL/min (ref >60)
GFR calc non Af Amer: >60 mL/min (ref >60)
Glucose, Bld: 157 mg/dL — ABNORMAL HIGH (ref 70–99)
Potassium: 4 mmol/L (ref 3.5–5.1)
Sodium: 135 mmol/L (ref 135–145)
Total Bilirubin: 0.7 mg/dL (ref 0.3–1.2)
Total Protein: 6.5 g/dL (ref 6.5–8.1)

## 2020-03-17 LAB — CBC WITH DIFFERENTIAL/PLATELET
Abs Immature Granulocytes: 0.04 10*3/uL (ref 0.00–0.07)
Basophils Absolute: 0 10*3/uL (ref 0.0–0.1)
Basophils Relative: 1 %
Eosinophils Absolute: 0.1 10*3/uL (ref 0.0–0.5)
Eosinophils Relative: 2 %
HCT: 41.2 % (ref 39.0–52.0)
Hemoglobin: 14.7 g/dL (ref 13.0–17.0)
Immature Granulocytes: 1 %
Lymphocytes Relative: 23 %
Lymphs Abs: 1.2 10*3/uL (ref 0.7–4.0)
MCH: 29.9 pg (ref 26.0–34.0)
MCHC: 35.7 g/dL (ref 30.0–36.0)
MCV: 83.7 fL (ref 80.0–100.0)
Monocytes Absolute: 0.4 10*3/uL (ref 0.1–1.0)
Monocytes Relative: 8 %
Neutro Abs: 3.5 10*3/uL (ref 1.7–7.7)
Neutrophils Relative %: 65 %
Platelets: 171 10*3/uL (ref 150–400)
RBC: 4.92 MIL/uL (ref 4.22–5.81)
RDW: 12.5 % (ref 11.5–15.5)
WBC: 5.2 10*3/uL (ref 4.0–10.5)
nRBC: 0 % (ref 0.0–0.2)

## 2020-03-17 LAB — PSA: Prostatic Specific Antigen: 0.21 ng/mL (ref 0.00–4.00)

## 2020-03-17 NOTE — Assessment & Plan Note (Addendum)
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 PET scan February 2021-shows no uptake in the bones; shows uptake in the prostate bed; currently on EBRT to prostate [last Tx- on 01/08/2020].  #Recommend continued Lupron; [s/p June 2nd 2021-Dr. Odean.Pian ]; Await PSA from today; will get a PET scan in approximately 2 months; extra rest course Will discuss the role of antiandrogen therapy like-apalutamide/Xtandi.  #Prostate cancer-at age of 72 s/p-genetic counseling-NEG.   # Increased frequency of urination [3-4 weeks]; on Flomax- worse; ? UTI vs RT- recommend UA and  culture.   # DISPOSITION: will call with reslts #  Follow up on Oct 15th [with Dr.Crystal] ; MD; PET prior-Dr.B

## 2020-03-17 NOTE — Progress Notes (Signed)
[facility] CONSULT NOTE  Patient Care Team: Tonia Ghent, MD as PCP - General (Family Medicine) Wellington Hampshire, MD as PCP - Cardiology (Cardiology)  CHIEF COMPLAINTS/PURPOSE OF CONSULTATION: Prostate cancer  #  Oncology History Overview Note  1. Prostate adenocarcinoma  a. 02/2006 PSA returned elevated at 17.49  b. 04/03/2006; PSA 24.7; prostate biopsy Gleason 3+3 with 4/4 cores c. tx with HIFU overseas late 2007 in [place] [Dr.Cope]] d. persistent PSA elevation over period 2007-2010; nadir 2.1 09/16/06, peak 15.0 ng/mL  e. 04/2006 bone scan and CT abd/pelvis without evidence of metastasis f. 03/08/2007 CT abdomen/pelvis with no definitive metastatic disease noted  g. 03/18/2007 prostascint scan with no evidence of disease but small pelvic lymph nodes noted  h. 08/15/2009 prostascint scan with no evidence of local prostate cancer or distant metastases  i. 08/22/2009 PSA 15 ng/mL; 02/06/2010 PSA 20.5 ng/mL  j. 03/08/2010 PSA 20.9 ng/mL  k. 06/02/10 initiation of degarelix  l. 05/25/10 PET CT (Fluoride) revealed heterogenous areas of FDG-avidity in ribs, femurs, humeri, areas also in the metatarsals noted which may have been arthiritic in nature  m. 07/03/10 PSA 9.6 ng/mL  o.  Started Lupron/Casodex; June 2020-PSA from 0.2 to 0.3 -I;  As per patient-Casodex was withdrawn-but noted to have a jump of PSA to 0.9. FEB 2021-evidence of prostatic malignancy in the residual prostate bed; no distant metastatic disease  #comorbidities-CAD s/p CABG; diabetes; PVD  # NGS/MOLECULAR TESTS:NA  # PALLIATIVE CARE EVALUATION: NA  # PAIN MANAGEMENT: NA   DIAGNOSIS:   STAGE:         ;  GOALS:  CURRENT/MOST RECENT THERAPY :     Prostate cancer ([place])  09/16/2007 Initial Diagnosis   Prostate cancer ([place])   01/22/2020 Genetic Testing   Negative genetic testing:  No pathogenic variants detected on the [facility] Multi-Cancer panel. The report date is 01/22/2020.   The Common Hereditary  Cancers Panel offered by [facility] includes sequencing and/or deletion duplication testing of the following 48 genes: APC, ATM, AXIN2, BARD1, BMPR1A, BRCA1, BRCA2, BRIP1, CDH1, CDK4, CDKN2A (p14ARF), CDKN2A (p16INK4a), CHEK2, CTNNA1, DICER1, EPCAM (Deletion/duplication testing only), GREM1 (promoter region deletion/duplication testing only), KIT, MEN1, MLH1, MSH2, MSH3, MSH6, MUTYH, NBN, NF1, NTHL1, PALB2, PDGFRA, PMS2, POLD1, POLE, PTEN, RAD50, RAD51C, RAD51D, RNF43, SDHB, SDHC, SDHD, SMAD4, SMARCA4. STK11, TP53, TSC1, TSC2, and VHL.  The following genes were evaluated for sequence changes only: SDHA and HOXB13 c.251G>A variant only.     HISTORY OF PRESENTING ILLNESS:  Phillip Clements 69 y.o.  male with longstanding history of prostate cancer ? metastatic to bone/Lupron-is here for follow-up.  Patient finished radiation extending the prostate May 27th, 2021.  Patient noted to have increased frequency of urination dribbling last 3 to 4 weeks.  No fever no chills.  Patient was also started on on Flomax by Dr. Donella Stade.  No bone pain pain no nausea no vomiting.    Review of Systems  Constitutional: Positive for malaise/fatigue. Negative for chills, diaphoresis, fever and weight loss.  HENT: Negative for nosebleeds and sore throat.   Eyes: Negative for double vision.  Respiratory: Negative for cough, hemoptysis, sputum production, shortness of breath and wheezing.   Cardiovascular: Negative for chest pain, palpitations, orthopnea and leg swelling.  Gastrointestinal: Negative for abdominal pain, blood in stool, constipation, diarrhea, heartburn, melena, nausea and vomiting.  Genitourinary: Positive for frequency and urgency.  Musculoskeletal: Positive for back pain. Negative for joint pain.  Skin: Negative.  Negative for itching and rash.  Neurological: Negative for dizziness, tingling, focal weakness, weakness and headaches.  Endo/Heme/Allergies: Does not bruise/bleed easily.   Psychiatric/Behavioral: Negative for depression. The patient is not nervous/anxious and does not have insomnia.      MEDICAL HISTORY:  Past Medical History:  Diagnosis Date  . Cataract    bilateral eye in 2010  . Coronary artery disease    a. CABG 04/2014: (LIMA->LAD, VG->DIAG, VG->OM2, VG->PDA)  b. 07/2014 early graft failure (VG->OM2 60, LIMA->LAD atretic, VG->PDA & VG->D1 100). s/p successful rotational atherectomy & DES of the LM & pLAD (3.0x24 Promus DES) & mLAD (2.25x24 Promus DES).  . CVA (cerebral infarction)   . Family history of prostate cancer   . Hx of adenomatous colonic polyps 06/17/2017  . Hyperlipidemia    a. Statin intolerant.  . Hypertension 2000  . Moderate mitral regurgitation    a. 02/2015 Echo: EF 55-60%, no rwma, mod MR, mod BAE, mildly dil RV w/ low nl RV fxn.  . OSA on CPAP   . Peripheral vascular disease ([place])    a. left SFA angioplasty in December 2016  . Prostate cancer ([place])   . Sleep apnea    wears CPAP nightly  . Type II diabetes mellitus ([place])     SURGICAL HISTORY: Past Surgical History:  Procedure Laterality Date  . ANTERIOR CERVICAL DECOMP/DISCECTOMY FUSION  08/25/2012   Procedure: ANTERIOR CERVICAL DECOMPRESSION/DISCECTOMY FUSION 2 LEVELS;  Surgeon: Hosie Spangle, MD;  Location: [facility];  Service: Neurosurgery;  Laterality: N/A;  Cervical five-six Cervical six-seven anterior cervical decompression with fusion and plating and bonegraft  . BACK SURGERY    . CARDIAC CATHETERIZATION  04/2014; 07/19/2014  . CORONARY ARTERY BYPASS GRAFT N/A 04/26/2014   Procedure: CORONARY ARTERY BYPASS GRAFTING (CABG), on pump, times four, using left internal mammary artery, right greater saphenous vein harvested endoscopically.;  Surgeon: Ivin Poot, MD;  Location: [facility];  Service: Open Heart Surgery;  Laterality: N/A;  LIMA to LAD, SVG to DIAGONAL, SVG to OM1, SVG to PDA) with EVH of the RIGHT THIGH and LOWER EXTREMITY SAPHENOUS VEIN  . Cytoscopy prostatic  stone o/w nml  06/21/08   Dr. Jacqlyn Larsen  . ETT myoview  09/13/09   Low risk EF 49%  . High intense focused ultrasound  07/20/06   By Dr. Jacqlyn Larsen  . INTRAOPERATIVE TRANSESOPHAGEAL ECHOCARDIOGRAM N/A 04/26/2014   Procedure: INTRAOPERATIVE TRANSESOPHAGEAL ECHOCARDIOGRAM;  Surgeon: Ivin Poot, MD;  Location: [facility];  Service: Open Heart Surgery;  Laterality: N/A;  . PERCUTANEOUS CORONARY ROTOBLATOR INTERVENTION (PCI-R) N/A 07/22/2014   Procedure: PERCUTANEOUS CORONARY ROTOBLATOR INTERVENTION (PCI-R);  Surgeon: Peter M Martinique, MD;  Location: [facility] CATH LAB;  Service: Cardiovascular;  Laterality: N/A;  . PERIPHERAL VASCULAR CATHETERIZATION Left 07/26/2015   Procedure: Lower Extremity Angiography;  Surgeon: Katha Cabal, MD;  Location: [facility] CV LAB;  Service: Cardiovascular;  Laterality: Left;  . PERIPHERAL VASCULAR CATHETERIZATION  07/26/2015   Procedure: Lower Extremity Intervention;  Surgeon: Katha Cabal, MD;  Location: [facility] CV LAB;  Service: Cardiovascular;;  . PROSTATE BIOPSY  04/03/06 & 02/25/07    SOCIAL HISTORY: Social History   Socioeconomic History  . Marital status: Married    Spouse name: Not on file  . Number of children: 3  . Years of education: Not on file  . Highest education level: Not on file  Occupational History  . Occupation: Insurance account manager  Tobacco Use  . Smoking status: Former Smoker    Packs/day:  1.00    Years: 36.00    Pack years: 36.00    Types: Cigarettes    Quit date: 03/01/2014    Years since quitting: 6.0  . Smokeless tobacco: Never Used  Vaping Use  . Vaping Use: Never used  Substance and Sexual Activity  . Alcohol use: Yes    Comment: 07/19/2014 "might have a drink a couple times/yr"  . Drug use: No  . Sexual activity: Not Currently  Other Topics Concern  . 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 In [place].    Social Determinants of Health   Financial Resource Strain:   . Difficulty of Paying Living Expenses:   Food Insecurity:   . Worried About Charity fundraiser in the Last Year:   . Arboriculturist in the Last Year:   Transportation Needs:   . Film/video editor (Medical):   Marland Kitchen Lack of Transportation (Non-Medical):   Physical Activity:   . Days of Exercise per Week:   . Minutes of Exercise per Session:   Stress:   . Feeling of Stress :   Social Connections:   . Frequency of Communication with Friends and Family:   . Frequency of Social Gatherings with Friends and Family:   . Attends Religious Services:   . Active Member of Clubs or Organizations:   . Attends Archivist Meetings:   Marland Kitchen Marital Status:   Intimate Partner Violence:   . Fear of Current or Ex-Partner:   . Emotionally Abused:   Marland Kitchen Physically Abused:   . Sexually Abused:     FAMILY HISTORY: Family History  Problem Relation Age of Onset  . Diabetes Mother 68       DM  . Coronary artery disease Mother   . Hypertension Mother   . Heart attack Mother        CABG with MI in 2005  . Heart disease Mother        CV  . Hypertension Father   . Heart attack Father        CABG with Mi around 1997  . Coronary artery disease Father   . Heart disease Father        CV  . Diabetes Father   . Heart attack Brother        MI/PTCA  . Heart disease Brother        CV  . Diabetes Brother   . Heart attack Maternal Grandfather        MI  . Prostate cancer Paternal Grandfather 46       likely metastatic  . Diabetes Sister        DM  . Breast cancer Neg Hx        Breast/ovarian/uterine cancer  . Colon cancer Neg Hx   . Depression Neg Hx   . Alcohol abuse Neg Hx        ETOH/drug abuse  . Stroke Neg Hx     ALLERGIES:  is allergic to coreg [carvedilol], metformin and related, protonix [pantoprazole  sodium], rosuvastatin calcium, and statins.  MEDICATIONS:  Current Outpatient Medications  Medication Sig Dispense Refill  . aspirin EC 81 MG tablet Take 1 tablet (81 mg total) by mouth daily. 90 tablet 3  . canagliflozin (INVOKANA) 300 MG TABS tablet TAKE 1 TABLET  BY MOUTH ONCE DAILY WITH BREAKFAST 90 tablet 3  . carvedilol (COREG) 3.125 MG tablet Take 1 tablet (3.125 mg total) by mouth 2 (two) times daily with a meal. 180 tablet 0  . clopidogrel (PLAVIX) 75 MG tablet Take 1 tablet (75 mg total) by mouth daily. 30 tablet 4  . Dulaglutide (TRULICITY) 3 XB/2.8UX SOPN Inject 3 mg into the skin once a week. 12 pen 3  . ezetimibe (ZETIA) 10 MG tablet TAKE 1 TABLET BY MOUTH ONCE DAILY 30 tablet 5  . glucose blood (ONETOUCH ULTRA) test strip CHECK BLOOD SUGAR TWICE A DAY - DX E11.59 200 strip 3  . Injection Device ([place]) MISC Frequency:PHARMDIR   Dosage:0.0     Instructions:  Note:diagnosis 250.02 Dose: NA    . insulin degludec (TRESIBA FLEXTOUCH) 200 UNIT/ML FlexTouch Pen Inject 44 Units into the skin daily. 9 pen 3  . Insulin Pen Needle 32G X 4 MM MISC Use 1x a day 100 each 3  . leuprolide (LUPRON) 30 MG injection Inject 45 mg into the muscle every 6 (six) months.    Marland Kitchen losartan (COZAAR) 25 MG tablet TAKE 1/2 TABLET BY MOUTH ONCE A DAY 15 tablet 5  . meclizine (ANTIVERT) 25 MG tablet Take 0.5-1 tablets (12.5-25 mg total) by mouth 2 (two) times daily as needed for dizziness or nausea. 30 tablet 0  . nitroGLYCERIN (NITROSTAT) 0.4 MG SL tablet Place 1 tablet (0.4 mg total) under the tongue as needed. 25 tablet 3  . REPATHA SURECLICK 324 MG/ML SOAJ INJECT ONE PEN INTO THE SKIN EVERY 14 DAYS 2 mL 3  . tamsulosin (FLOMAX) 0.4 MG CAPS capsule Take 1 capsule (0.4 mg total) by mouth daily after supper. 30 capsule 1   No current facility-administered medications for this visit.      Marland Kitchen  PHYSICAL EXAMINATION: ECOG PERFORMANCE STATUS: 0 - Asymptomatic  Vitals:   03/17/20 1036  BP:  (!) 142/71  Pulse: 66  Temp: (!) 96.5 F (35.8 C)  SpO2: 100%   Filed Weights   03/17/20 1036  Weight: 235 lb 6.4 oz (106.8 kg)    Physical Exam HENT:     Head: Normocephalic and atraumatic.     Mouth/Throat:     Pharynx: No oropharyngeal exudate.  Eyes:     Pupils: Pupils are equal, round, and reactive to light.  Cardiovascular:     Rate and Rhythm: Normal rate and regular rhythm.  Pulmonary:     Effort: Pulmonary effort is normal. No respiratory distress.     Breath sounds: Normal breath sounds. No wheezing.  Abdominal:     General: Bowel sounds are normal. There is no distension.     Palpations: Abdomen is soft. There is no mass.     Tenderness: There is no abdominal tenderness. There is no guarding or rebound.  Musculoskeletal:        General: No tenderness. Normal range of motion.     Cervical back: Normal range of motion and neck supple.  Skin:    General: Skin is warm.  Neurological:     Mental Status: He is alert and oriented to person, place, and time.  Psychiatric:        Mood and Affect: Affect normal.     LABORATORY DATA:  I have reviewed the data as listed Lab Results  Component Value Date   WBC 5.2 03/17/2020   HGB 14.7 03/17/2020   HCT 41.2 03/17/2020   MCV 83.7 03/17/2020   PLT 171  03/17/2020   Recent Labs    10/09/19 0952 03/17/20 1012  NA 139 135  K 4.2 4.0  CL 103 102  CO2 28 24  GLUCOSE 201* 157*  BUN 23 17  CREATININE 0.91 0.69  CALCIUM 8.7* 8.6*  GFRNONAA >60 >60  GFRAA >60 >60  PROT 6.8 6.5  ALBUMIN 4.0 3.9  AST 12* 14*  ALT 20 18  ALKPHOS 63 65  BILITOT 0.9 0.7    RADIOGRAPHIC STUDIES: I have personally reviewed the radiological images as listed and agreed with the findings in the report. No results found.  ASSESSMENT & PLAN:   Prostate cancer ([place]) # STAGE-IV-Prostate cancer-metastatic to bone/castrate resistant. PET scan February 2021-shows no uptake in the bones; shows uptake in the prostate bed; currently on  EBRT to prostate [last Tx- on 01/08/2020].  #Recommend continued Lupron; [s/p June 2nd 2021-Dr. Odean.Pian ]; Await PSA from today; will get a PET scan in approximately 2 months; extra rest course Will discuss the role of antiandrogen therapy like-apalutamide/Xtandi.  #Prostate cancer-at age of 2 s/p-genetic counseling-NEG.   # Increased frequency of urination [3-4 weeks]; on Flomax- worse; ? UTI vs RT- recommend UA and  culture.   # DISPOSITION: will call with reslts #  Follow up on Oct 15th [facility] Dr.Crystal] ; MD; PET prior-Dr.B    All questions were answered. The patient knows to call the clinic with any problems, questions or concerns.    Cammie Sickle, MD 03/17/2020 12:55 PM

## 2020-03-18 ENCOUNTER — Telehealth: Payer: Self-pay | Admitting: Internal Medicine

## 2020-03-18 LAB — URINE CULTURE: Culture: NO GROWTH

## 2020-03-18 NOTE — Telephone Encounter (Signed)
FYI-Dr.Stoioff- Thanks, GB  -----------------------------------------------------------------------------  Hi Mr.Portal--I wanted to inform you that your PSA has gotten better from 0.9 to 0.2.  Will await PET scan as discussed.  With regards to increased frequency of urination-urinalysis negative for infection. I would recommend continue Flomax.  Recommend reach out to Dr. Bernardo Heater for further recommendations.  Please call us if questions.  Earlier I left a message-with above recommendations.   Dr.B  ------------------------------------------------------------------------------

## 2020-03-19 ENCOUNTER — Other Ambulatory Visit: Payer: Self-pay | Admitting: Internal Medicine

## 2020-03-19 ENCOUNTER — Other Ambulatory Visit: Payer: Self-pay | Admitting: Cardiovascular Disease

## 2020-03-20 NOTE — Telephone Encounter (Signed)
Recommend office visit to discuss options

## 2020-03-21 NOTE — Telephone Encounter (Signed)
Patient notified and appointment has been scheduled 

## 2020-03-22 ENCOUNTER — Other Ambulatory Visit: Payer: Self-pay

## 2020-03-22 DIAGNOSIS — R079 Chest pain, unspecified: Secondary | ICD-10-CM

## 2020-03-22 DIAGNOSIS — E785 Hyperlipidemia, unspecified: Secondary | ICD-10-CM

## 2020-03-22 DIAGNOSIS — I2510 Atherosclerotic heart disease of native coronary artery without angina pectoris: Secondary | ICD-10-CM

## 2020-03-22 DIAGNOSIS — I10 Essential (primary) hypertension: Secondary | ICD-10-CM

## 2020-03-22 MED ORDER — LOSARTAN POTASSIUM 25 MG PO TABS: ORAL_TABLET | ORAL | 0 refills | Status: DC

## 2020-03-25 ENCOUNTER — Ambulatory Visit (INDEPENDENT_AMBULATORY_CARE_PROVIDER_SITE_OTHER): Payer: Medicare Other | Admitting: Urology

## 2020-03-25 ENCOUNTER — Other Ambulatory Visit: Payer: Self-pay

## 2020-03-25 ENCOUNTER — Encounter: Payer: Self-pay | Admitting: Urology

## 2020-03-25 VITALS — BP 128/68 | HR 65 | Ht 69.0 in | Wt 229.0 lb

## 2020-03-25 DIAGNOSIS — C61 Malignant neoplasm of prostate: Secondary | ICD-10-CM

## 2020-03-25 DIAGNOSIS — R350 Frequency of micturition: Secondary | ICD-10-CM

## 2020-03-25 DIAGNOSIS — R3915 Urgency of urination: Secondary | ICD-10-CM | POA: Diagnosis not present

## 2020-03-25 DIAGNOSIS — R351 Nocturia: Secondary | ICD-10-CM | POA: Diagnosis not present

## 2020-03-25 LAB — URINALYSIS, COMPLETE
Bilirubin, UA: NEGATIVE
Ketones, UA: NEGATIVE
Leukocytes,UA: NEGATIVE
Nitrite, UA: NEGATIVE
Protein,UA: NEGATIVE
RBC, UA: NEGATIVE
Specific Gravity, UA: 1.02 (ref 1.005–1.030)
Urobilinogen, Ur: 0.2 mg/dL (ref 0.2–1.0)
pH, UA: 5.5 (ref 5.0–7.5)

## 2020-03-25 LAB — MICROSCOPIC EXAMINATION
Bacteria, UA: NONE SEEN
Epithelial Cells (non renal): NONE SEEN /hpf (ref 0–10)

## 2020-03-25 NOTE — Progress Notes (Signed)
03/25/2020 10:26 AM   Phillip Clements 06-17-1951 [id number]  Referring provider: Tonia Ghent, MD [address] [place],  [place] [id number]  Chief Complaint  Patient presents with  . Follow-up    urinary frequency    Urologic history: 1. Prostate adenocarcinoma  a. 02/2006 PSA returned elevated at 17.49  b. 04/03/2006;PSA 24.7;prostate biopsy Gleason 3+3 with 4/4 cores c.[place]U [place] late 2007  d. persistent PSA elevation over period 2007-2010; nadir 2.1 09/16/06, peak 15.0 ng/mL  e. 04/2006 bone scan and CT abd/pelvis without evidence of metastasis f. 03/08/2007 CT abdomen/pelvis with no definitive metastatic disease noted  g. 03/18/2007 prostascint scan with no evidence of disease but small pelvic lymph nodes noted  h. 08/15/2009 prostascint scan with no evidence of local prostate cancer or distant metastases  i. 08/22/2009 PSA 15 ng/mL; 02/06/2010 PSA 20.5 ng/mL  j. 03/08/2010 PSA 20.9 ng/mL  k. 06/02/10 initiation of degarelix  l. 05/25/10 PET CT (Fluoride) revealed heterogenous areas of FDG-avidity in ribs, femurs, humeri, areas also in the metatarsals noted which may have been arthiritic in nature  m. 07/03/10 PSA 9.6 ng/mL  o.Started Lupron/Casodex p.  Axumin scan 09/2019 showed tracer avid soft tissue and prostate bed; no evidence of nodal or osseous metastasis q. completed salvage radiation 01/08/2020   HPI: 69 y.o. male presents for recent onset of bothersome lower urinary tract symptoms.   Approximately 1 month after completing radiation he had onset of urinary frequency which he estimates hourly during the day and every 2 hours at night  Rare episodes of urge incontinence  Denies dysuria or gross hematuria  Mild bladder discomfort after voiding  On tamsulosin  His wife had questions regarding whether he would be able to discontinue Lupron and asked if patients recover from the effects of Lupron.  She states she is also noted increased hair  growth  PMH: Past Medical History:  Diagnosis Date  . Cataract    bilateral eye in 2010  . Coronary artery disease    a. CABG 04/2014: (LIMA->LAD, VG->DIAG, VG->OM2, VG->PDA)  b. 07/2014 early graft failure (VG->OM2 60, LIMA->LAD atretic, VG->PDA & VG->D1 100). s/p successful rotational atherectomy & DES of the LM & pLAD (3.0x24 Promus DES) & mLAD (2.25x24 Promus DES).  . CVA (cerebral infarction)   . Family history of prostate cancer   . Hx of adenomatous colonic polyps 06/17/2017  . Hyperlipidemia    a. Statin intolerant.  . Hypertension 2000  . Moderate mitral regurgitation    a. 02/2015 Echo: EF 55-60%, no rwma, mod MR, mod BAE, mildly dil RV w/ low nl RV fxn.  . OSA on CPAP   . Peripheral vascular disease ([place])    a. left SFA angioplasty in December 2016  . Prostate cancer ([place])   . Sleep apnea    wears CPAP nightly  . Type II diabetes mellitus ([place])     Surgical History: Past Surgical History:  Procedure Laterality Date  . ANTERIOR CERVICAL DECOMP/DISCECTOMY FUSION  08/25/2012   Procedure: ANTERIOR CERVICAL DECOMPRESSION/DISCECTOMY FUSION 2 LEVELS;  Surgeon: Hosie Spangle, MD;  Location: [facility];  Service: Neurosurgery;  Laterality: N/A;  Cervical five-six Cervical six-seven anterior cervical decompression with fusion and plating and bonegraft  . BACK SURGERY    . CARDIAC CATHETERIZATION  04/2014; 07/19/2014  . CORONARY ARTERY BYPASS GRAFT N/A 04/26/2014   Procedure: CORONARY ARTERY BYPASS GRAFTING (CABG), on pump, times four, using left internal mammary artery, right greater saphenous vein harvested  endoscopically.;  Surgeon: Ivin Poot, MD;  Location: [facility];  Service: Open Heart Surgery;  Laterality: N/A;  LIMA to LAD, SVG to DIAGONAL, SVG to OM1, SVG to PDA) with EVH of the RIGHT THIGH and LOWER EXTREMITY SAPHENOUS VEIN  . Cytoscopy prostatic stone o/w nml  06/21/08   Dr. Jacqlyn Larsen  . ETT myoview  09/13/09   Low risk EF 49%  . High intense focused ultrasound  07/20/06    By Dr. Jacqlyn Larsen  . INTRAOPERATIVE TRANSESOPHAGEAL ECHOCARDIOGRAM N/A 04/26/2014   Procedure: INTRAOPERATIVE TRANSESOPHAGEAL ECHOCARDIOGRAM;  Surgeon: Ivin Poot, MD;  Location: [facility];  Service: Open Heart Surgery;  Laterality: N/A;  . PERCUTANEOUS CORONARY ROTOBLATOR INTERVENTION (PCI-R) N/A 07/22/2014   Procedure: PERCUTANEOUS CORONARY ROTOBLATOR INTERVENTION (PCI-R);  Surgeon: Peter M Martinique, MD;  Location: [facility] CATH LAB;  Service: Cardiovascular;  Laterality: N/A;  . PERIPHERAL VASCULAR CATHETERIZATION Left 07/26/2015   Procedure: Lower Extremity Angiography;  Surgeon: Katha Cabal, MD;  Location: [facility] CV LAB;  Service: Cardiovascular;  Laterality: Left;  . PERIPHERAL VASCULAR CATHETERIZATION  07/26/2015   Procedure: Lower Extremity Intervention;  Surgeon: Katha Cabal, MD;  Location: [facility];  Service: Cardiovascular;;  . PROSTATE BIOPSY  04/03/06 & 02/25/07    Home Medications:  Allergies as of 03/25/2020      Reactions   Coreg [carvedilol] Other (See Comments)   Fatigue- unclear if this was due to coreg specifically or beta blockers in general.     Metformin And Related Nausea Only   Protonix [pantoprazole Sodium] Diarrhea   Rosuvastatin Calcium    REACTION: JOINT ACHES   Statins    REACTION: JOINT ACHES      Medication List       Accurate as of March 25, 2020 10:26 AM. If you have any questions, ask your nurse or doctor.        aspirin EC 81 MG tablet Take 1 tablet (81 mg total) by mouth daily.   carvedilol 3.125 MG tablet Commonly known as: COREG Take 1 tablet (3.125 mg total) by mouth 2 (two) times daily with a meal.   clopidogrel 75 MG tablet Commonly known as: PLAVIX Take 1 tablet (75 mg total) by mouth daily.   [place] [employer] Pipetting Misc Frequency:PHARMDIR   Dosage:0.0     Instructions:  Note:diagnosis 250.02 Dose: NA   ezetimibe 10 MG tablet Commonly known as: ZETIA Take 1 tablet (10 mg total) by mouth daily. PLEASE CALL  OFFICE TO SCHEDULE APPOINTMENT FOR FURTHER REFILLS.   Insulin Pen Needle 32G X 4 MM Misc Use 1x a day   Invokana 300 MG Tabs tablet Generic drug: canagliflozin TAKE 1 TABLET BY MOUTH ONCE A DAY WITH BREAKFAST   leuprolide 30 MG injection Commonly known as: LUPRON Inject 45 mg into the muscle every 6 (six) months.   losartan 25 MG tablet Commonly known as: COZAAR TAKE 1/2 TABLET BY MOUTH ONCE A DAY   meclizine 25 MG tablet Commonly known as: ANTIVERT Take 0.5-1 tablets (12.5-25 mg total) by mouth 2 (two) times daily as needed for dizziness or nausea.   nitroGLYCERIN 0.4 MG SL tablet Commonly known as: Nitrostat Place 1 tablet (0.4 mg total) under the tongue as needed.   OneTouch Ultra test strip Generic drug: glucose blood CHECK BLOOD SUGAR TWICE A DAY - DX X54.00   Repatha SureClick 867 MG/ML Soaj Generic drug: Evolocumab INJECT ONE PEN INTO THE SKIN EVERY 14 DAYS   tamsulosin 0.4 MG Caps capsule Commonly  known as: FLOMAX Take 1 capsule (0.4 mg total) by mouth daily after supper.   Tyler Aas FlexTouch 200 UNIT/ML FlexTouch Pen Generic drug: insulin degludec Inject 44 Units into the skin daily.   Trulicity 3 EU/2.3NT Sopn Generic drug: Dulaglutide Inject 3 mg into the skin once a week.       Allergies:  Allergies  Allergen Reactions  . Coreg [Carvedilol] Other (See Comments)    Fatigue- unclear if this was due to coreg specifically or beta blockers in general.    . Metformin And Related Nausea Only  . Protonix [Pantoprazole Sodium] Diarrhea  . Rosuvastatin Calcium     REACTION: JOINT ACHES  . Statins     REACTION: JOINT ACHES    Family History: Family History  Problem Relation Age of Onset  . Diabetes Mother 72       DM  . Coronary artery disease Mother   . Hypertension Mother   . Heart attack Mother        CABG with MI in 2005  . Heart disease Mother        CV  . Hypertension Father   . Heart attack Father        CABG with Mi around 1997  .  Coronary artery disease Father   . Heart disease Father        CV  . Diabetes Father   . Heart attack Brother        MI/PTCA  . Heart disease Brother        CV  . Diabetes Brother   . Heart attack Maternal Grandfather        MI  . Prostate cancer Paternal Grandfather 37       likely metastatic  . Diabetes Sister        DM  . Breast cancer Neg Hx        Breast/ovarian/uterine cancer  . Colon cancer Neg Hx   . Depression Neg Hx   . Alcohol abuse Neg Hx        ETOH/drug abuse  . Stroke Neg Hx     Social History:  reports that he quit smoking about 6 years ago. His smoking use included cigarettes. He has a 36.00 pack-year smoking history. He has never used smokeless tobacco. He reports current alcohol use. He reports that he does not use drugs.   Physical Exam: BP 128/68   Pulse 65   Ht 5\' 9"  (1.753 m)   Wt 229 lb (103.9 kg)   BMI 33.82 kg/m   Constitutional:  Alert and oriented, No acute distress. HEENT: [place] AT, moist mucus membranes.  Trachea midline, no masses. Cardiovascular: No clubbing, cyanosis, or edema. Respiratory: Normal respiratory effort, no increased work of breathing. Neurologic: Grossly intact, no focal deficits, moving all 4 extremities. Psychiatric: Normal mood and affect.   Assessment & Plan:    1.  Lower urinary tract symptoms  New problem  Storage related voiding symptoms which started 1 month after completing radiation  PVR by bladder scan 19 mL  Trial Myrbetriq 50 mg-samples given and they will call back regarding efficacy  2. Prostate cancer [facility])  Informed Ms. Memmott I would defer to Dr. Rogue Bussing regarding discontinuing Lupron but informed her that would be a possibility if his follow-up Axumin scan in October showed no evidence of disease  We discussed that there are small percentage of patients whose testosterone levels do not recover after discontinuing Lupron  Will check testosterone level since she has noted  some increased hair  growth  Follow-up 6 months or prn    Abbie Sons, MD  [facility] [address] [place], [place] [address] [phone]

## 2020-03-26 LAB — TESTOSTERONE: Testosterone: 23 ng/dL — ABNORMAL LOW (ref 264–916)

## 2020-03-28 ENCOUNTER — Telehealth: Payer: Self-pay | Admitting: Family Medicine

## 2020-03-28 NOTE — Telephone Encounter (Signed)
-----   Message from Abbie Sons, MD sent at 03/27/2020  6:28 PM EDT ----- Testosterone level was 23.  [employer] is a < 50 for patients on leuprolide

## 2020-03-28 NOTE — Telephone Encounter (Signed)
Patient notified and voiced understanding.

## 2020-04-01 ENCOUNTER — Ambulatory Visit: Payer: Medicare Other | Admitting: Cardiovascular Disease

## 2020-04-04 ENCOUNTER — Encounter: Payer: Self-pay | Admitting: Physician Assistant

## 2020-04-04 ENCOUNTER — Ambulatory Visit (INDEPENDENT_AMBULATORY_CARE_PROVIDER_SITE_OTHER): Payer: Medicare Other | Admitting: Physician Assistant

## 2020-04-04 ENCOUNTER — Other Ambulatory Visit: Payer: Self-pay

## 2020-04-04 VITALS — BP 115/54 | HR 70 | Ht 69.0 in | Wt 233.2 lb

## 2020-04-04 DIAGNOSIS — Z789 Other specified health status: Secondary | ICD-10-CM

## 2020-04-04 DIAGNOSIS — Z794 Long term (current) use of insulin: Secondary | ICD-10-CM | POA: Diagnosis not present

## 2020-04-04 DIAGNOSIS — Z9989 Dependence on other enabling machines and devices: Secondary | ICD-10-CM

## 2020-04-04 DIAGNOSIS — E785 Hyperlipidemia, unspecified: Secondary | ICD-10-CM | POA: Diagnosis not present

## 2020-04-04 DIAGNOSIS — I2510 Atherosclerotic heart disease of native coronary artery without angina pectoris: Secondary | ICD-10-CM

## 2020-04-04 DIAGNOSIS — R0600 Dyspnea, unspecified: Secondary | ICD-10-CM

## 2020-04-04 DIAGNOSIS — I10 Essential (primary) hypertension: Secondary | ICD-10-CM

## 2020-04-04 DIAGNOSIS — I739 Peripheral vascular disease, unspecified: Secondary | ICD-10-CM

## 2020-04-04 DIAGNOSIS — G4733 Obstructive sleep apnea (adult) (pediatric): Secondary | ICD-10-CM | POA: Diagnosis not present

## 2020-04-04 DIAGNOSIS — E119 Type 2 diabetes mellitus without complications: Secondary | ICD-10-CM

## 2020-04-04 NOTE — Progress Notes (Signed)
Office Visit    Patient Name: Phillip Clements Date of Encounter: 04/04/2020  Primary Care Provider:  Tonia Ghent, MD Primary Cardiologist:  [name], MD  Chief Complaint    Chief Complaint  Patient presents with  . other    Pt. c/o shortness of breath with over exertion. Meds reviewed by the pt. verbally.     69 year old male with history of CAD s/p CABG, PAD s/p left FSA angioplasty, hypertension, hyperlipidemia, CVA, DM 2, obesity, stroke, MR, OSA, prostate cancer, and who presents today with report of DOE.  Past Medical History    Past Medical History:  Diagnosis Date  . Cataract    bilateral eye in 2010  . Coronary artery disease    a. CABG 04/2014: (LIMA->LAD, VG->DIAG, VG->OM2, VG->PDA)  b. 07/2014 early graft failure (VG->OM2 60, LIMA->LAD atretic, VG->PDA & VG->D1 100). s/p successful rotational atherectomy & DES of the LM & pLAD (3.0x24 Promus DES) & mLAD (2.25x24 Promus DES).  . CVA (cerebral infarction)   . Family history of prostate cancer   . Hx of adenomatous colonic polyps 06/17/2017  . Hyperlipidemia    a. Statin intolerant.  . Hypertension 2000  . Moderate mitral regurgitation    a. 02/2015 Echo: EF 55-60%, no rwma, mod MR, mod BAE, mildly dil RV w/ low nl RV fxn.  . OSA on CPAP   . Peripheral vascular disease ([place])    a. left SFA angioplasty in December 2016  . Prostate cancer ([place])   . Sleep apnea    wears CPAP nightly  . Type II diabetes mellitus ([place])    Past Surgical History:  Procedure Laterality Date  . ANTERIOR CERVICAL DECOMP/DISCECTOMY FUSION  08/25/2012   Procedure: ANTERIOR CERVICAL DECOMPRESSION/DISCECTOMY FUSION 2 LEVELS;  Surgeon: Hosie Spangle, MD;  Location: [place] [facility];  Service: Neurosurgery;  Laterality: N/A;  Cervical five-six Cervical six-seven anterior cervical decompression with fusion and plating and bonegraft  . BACK SURGERY    . CARDIAC CATHETERIZATION  04/2014; 07/19/2014  . CORONARY ARTERY BYPASS GRAFT N/A  04/26/2014   Procedure: CORONARY ARTERY BYPASS GRAFTING (CABG), on pump, times four, using left internal mammary artery, right greater saphenous vein harvested endoscopically.;  Surgeon: Ivin Poot, MD;  Location: [place];  Service: Open Heart Surgery;  Laterality: N/A;  LIMA to LAD, SVG to DIAGONAL, SVG to OM1, SVG to PDA) with EVH of the RIGHT THIGH and LOWER EXTREMITY SAPHENOUS VEIN  . Cytoscopy prostatic stone o/w nml  06/21/08   Dr. Jacqlyn Larsen  . ETT myoview  09/13/09   Low risk EF 49%  . High intense focused ultrasound  07/20/06   By Dr. Jacqlyn Larsen  . INTRAOPERATIVE TRANSESOPHAGEAL ECHOCARDIOGRAM N/A 04/26/2014   Procedure: INTRAOPERATIVE TRANSESOPHAGEAL ECHOCARDIOGRAM;  Surgeon: Ivin Poot, MD;  Location: [place];  Service: Open Heart Surgery;  Laterality: N/A;  . PERCUTANEOUS CORONARY ROTOBLATOR INTERVENTION (PCI-R) N/A 07/22/2014   Procedure: PERCUTANEOUS CORONARY ROTOBLATOR INTERVENTION (PCI-R);  Surgeon: Peter M Martinique, MD;  Location: [facility] CATH LAB;  Service: Cardiovascular;  Laterality: N/A;  . PERIPHERAL VASCULAR CATHETERIZATION Left 07/26/2015   Procedure: Lower Extremity Angiography;  Surgeon: Katha Cabal, MD;  Location: [place] CV LAB;  Service: Cardiovascular;  Laterality: Left;  . PERIPHERAL VASCULAR CATHETERIZATION  07/26/2015   Procedure: Lower Extremity Intervention;  Surgeon: Katha Cabal, MD;  Location: [place] [place] CV LAB;  Service: Cardiovascular;;  . PROSTATE BIOPSY  04/03/06 & 02/25/07    Allergies  Allergies  Allergen  Reactions  . Coreg [Carvedilol] Other (See Comments)    Fatigue- unclear if this was due to coreg specifically or beta blockers in general.    . Metformin And Related Nausea Only  . Protonix [Pantoprazole Sodium] Diarrhea  . Rosuvastatin Calcium     REACTION: JOINT ACHES  . Statins     REACTION: JOINT ACHES    History of Present Illness    Phillip Clements is a 69 y.o. male with PMH as above.  He has known history of diabetes for at least  26 years in duration.  He is also s/p left SFA angioplasty in December 2016.  He also has a history of tobacco use, obesity, prostate cancer, and hyperlipidemia.  He is s/p CABG x4 in 04/2014 for severe three-vessel CAD.  EF was 45%.  He developed recurrent angina and 06/2014.  He underwent nuclear stress test which was abnormal.  He had inferolateral ST elevation with exercise.  Nuclear imaging showed evidence of inferior and anterior ischemia.  Repeat catheterization showed occluded grafts except for his SVG to OM.  He had 6% stenosis in the vein graft to the obtuse marginal with occluded vein graft to the diagonal and PDA.  His LIMA to LAD was atretic.  EF 55%.  He underwent atherectomy and stenting of the left main and LAD.  He is intolerant to statins.  He is currently on Repatha.  Of note, he has a history of melena in 04/2017.  Colonoscopy showed multiple polyps that were resected.  He follows with Dr. Cruzita Lederer of endocrinology for his diabetes.    He follows with Dr. Rogue Bussing of oncology for prostate cancer.  PET scan 09/2019 with no uptake to bones but uptake in prostate beds.  He is currently on EBRT to prostate (last tx 12/2019).  He was seen by oncology 03/2020 with Lupron continued. He was last seen by urology 03/2020 with Myrbetriq 50mg  samples provided for new lower urinary tract symptoms 1 month after completing his radiation and described as storage related/ increased frequency.    He was seen 12/2018 and noted dyspnea at higher levels of activity or when walking up hills.  He also noted mild bilateral discomfort after prolonged periods of walking, similar to prior claudication.  He was not interested in repeat imaging.  He was last seen in clinic 09/30/2019 with Laurann Montana, NP, at which time he reported stable symptoms.  He noted dyspnea with exertion only on stairs.  It was suspected this was due to deconditioning and the aging process.  He denied any shortness of breath at rest.  No  shortness of breath with walking around the house or various projects.  EKG was without acute changes.  He was continued on medications.  BP was well controlled.  He continued on Repatha and Zetia.  Fasting lipid panel was recommended at return.  CPAP,  Diet, and exercise were recommended.   Today, he returns to clinic and reports that he has been doing well.  He retired in January 2021 and has since purchased 19 acres in [place], [place].  He reports the end goal is to build a house on this land.  The previous owner was reportedly "a bit of a packrat;" and he has therefore been doing lots of work in the yard.  He states that he will become short of breath after about 20 minutes of carrying heavy wood, which he attributes to the aging process and deconditioning.  He does occasionally walk around the  19 acres; however, he does not walk very far, usually riding in a vehicle if covering the entire acreage.  He continues to note shortness of breath with stairs. He does not have a regular exercise routine, as he is remaining active with these projects of cleaning up the 19 acres. He denies otherwise denies chest pain, palpitations, pnd, orthopnea, n, v, dizziness, syncope, edema, weight gain, or early satiety.  He reports ongoing issues with bilateral calf discomfort, especially after prolonged periods of walking, and similar to prior claudication.  He reports that this has not improved with his intervention.  He reports a healthy diet. He reports medication compliance other than forgetting to take Repatha from time to time due to the every 2 week dosing regimen.  His most recent LDL was not well controlled, and this was discussed with patient preference to first attempt more consistent Repatha dosing before referral to the lipid clinic.  Several solutions to combat forgetting Repatha every 2 weeks were discussed, and he intends to have his wife assist him with setting up phone alarms to help remind him of  his Repatha every 2 weeks.  No signs or symptoms consistent with bleeding.  Home Medications    Prior to Admission medications   Medication Sig Start Date End Date Taking? Authorizing Provider  aspirin EC 81 MG tablet Take 1 tablet (81 mg total) by mouth daily. 11/01/14  Yes Wellington Hampshire, MD  carvedilol (COREG) 3.125 MG tablet Take 1 tablet (3.125 mg total) by mouth 2 (two) times daily with a meal. 02/26/20  Yes Wellington Hampshire, MD  clopidogrel (PLAVIX) 75 MG tablet Take 1 tablet (75 mg total) by mouth daily. 11/02/19  Yes Wellington Hampshire, MD  Dulaglutide (TRULICITY) 3 DD/2.2GU SOPN Inject 3 mg into the skin once a week. 06/26/19  Yes Philemon Kingdom, MD  ezetimibe (ZETIA) 10 MG tablet Take 1 tablet (10 mg total) by mouth daily. PLEASE CALL OFFICE TO SCHEDULE APPOINTMENT FOR FURTHER REFILLS. 03/21/20  Yes Wellington Hampshire, MD  glucose blood (ONETOUCH ULTRA) test strip CHECK BLOOD SUGAR TWICE A DAY - DX E11.59 06/26/19  Yes Philemon Kingdom, MD  Injection Device ([place]) MISC Frequency:PHARMDIR   Dosage:0.0     Instructions:  Note:diagnosis 250.02 Dose: NA 09/28/08  Yes [provider]  insulin degludec (TRESIBA FLEXTOUCH) 200 UNIT/ML FlexTouch Pen Inject 44 Units into the skin daily. 02/25/20  Yes Philemon Kingdom, MD  Insulin Pen Needle 32G X 4 MM MISC Use 1x a day 06/26/19  Yes Philemon Kingdom, MD  INVOKANA 300 MG TABS tablet TAKE 1 TABLET BY MOUTH ONCE A DAY WITH BREAKFAST 03/21/20  Yes Philemon Kingdom, MD  leuprolide (LUPRON) 30 MG injection Inject 45 mg into the muscle every 6 (six) months.   Yes [provider]  losartan (COZAAR) 25 MG tablet TAKE 1/2 TABLET BY MOUTH ONCE A DAY 03/22/20  Yes Theora Gianotti, NP  meclizine (ANTIVERT) 25 MG tablet Take 0.5-1 tablets (12.5-25 mg total) by mouth 2 (two) times daily as needed for dizziness or nausea. 01/26/19  Yes Ria Bush, MD  Mirabegron (MYRBETRIQ PO) Take by mouth daily.   Yes  [provider]  nitroGLYCERIN (NITROSTAT) 0.4 MG SL tablet Place 1 tablet (0.4 mg total) under the tongue as needed. 09/30/19  Yes Loel Dubonnet, NP  REPATHA SURECLICK 542 MG/ML SOAJ INJECT ONE PEN INTO THE SKIN EVERY 14 DAYS 12/10/19  Yes Wellington Hampshire, MD  tamsulosin (FLOMAX) 0.4 MG  CAPS capsule Take 1 capsule (0.4 mg total) by mouth daily after supper. 02/22/20  Yes Chrystal, Eulas Post, MD    Review of Systems    He denies chest pain, palpitations, pnd, orthopnea, n, v, dizziness, syncope, edema, weight gain, or early satiety. He reports DOE with higher levels of exertion.  He reports ongoing bilateral calf pain with longer episodes of walking and similar to previous reported claudication - all other systems reviewed and are otherwise negative except as noted above.  Physical Exam    VS:  BP (!) 115/54 (BP Location: Left Arm, Patient Position: Sitting, Cuff Size: Normal)   Pulse 70   Ht 5\' 9"  (1.753 m)   Wt 233 lb 4 oz (105.8 kg)   SpO2 98%   BMI 34.45 kg/m  , BMI Body mass index is 34.45 kg/m. GEN: Well nourished, well developed, in no acute distress. HEENT: normal. Neck: Supple, no JVD, carotid bruits, or masses. Cardiac: NOM,7/6 systolic murmur murmurs, rubs, or gallops. No clubbing, cyanosis, edema.  Radials/DP/PT 2+ and equal bilaterally.  Respiratory:  Respirations regular and unlabored, clear to auscultation bilaterally. GI: Soft, nontender, nondistended, BS + x 4. MS: no deformity or atrophy. Skin: warm and dry, no rash. Neuro:  Strength and sensation are intact. Psych: Normal affect.  Accessory Clinical Findings    ECG personally reviewed by me today - NSR, 70bpm, previously noted inferior infarct - no acute changes.  VITALS Reviewed today   Temp Readings from Last 3 Encounters:  03/17/20 (!) 96.5 F (35.8 C) (Tympanic)  02/22/20 (!) 96.8 F (36 C) (Tympanic)  12/10/19 (!) 95.2 F (35.1 C) (Tympanic)   BP Readings from Last 3 Encounters:  04/04/20  (!) 115/54  03/25/20 128/68  03/17/20 (!) 142/71   Pulse Readings from Last 3 Encounters:  04/04/20 70  03/25/20 65  03/17/20 66    Wt Readings from Last 3 Encounters:  04/04/20 233 lb 4 oz (105.8 kg)  03/25/20 229 lb (103.9 kg)  03/17/20 235 lb 6.4 oz (106.8 kg)     LABS  reviewed today   Lab Results  Component Value Date   WBC 5.2 03/17/2020   HGB 14.7 03/17/2020   HCT 41.2 03/17/2020   MCV 83.7 03/17/2020   PLT 171 03/17/2020   Lab Results  Component Value Date   CREATININE 0.69 03/17/2020   BUN 17 03/17/2020   NA 135 03/17/2020   K 4.0 03/17/2020   CL 102 03/17/2020   CO2 24 03/17/2020   Lab Results  Component Value Date   ALT 18 03/17/2020   AST 14 (L) 03/17/2020   ALKPHOS 65 03/17/2020   BILITOT 0.7 03/17/2020   Lab Results  Component Value Date   CHOL 166 10/09/2019   HDL 42 10/09/2019   LDLCALC 96 10/09/2019   LDLDIRECT 157.1 09/07/2013   TRIG 138 10/09/2019   CHOLHDL 4.0 10/09/2019    Lab Results  Component Value Date   HGBA1C 8.2 (A) 02/25/2020   Lab Results  Component Value Date   TSH 2.740 07/07/2014     STUDIES/PROCEDURES reviewed today   Echo 02/2015 - Left ventricle: The cavity size was normal. There was severe  concentric hypertrophy. Systolic function was normal. The  estimated ejection fraction was in the range of 55% to 60%.  Mildly reduced GLPSS at -16%. Wall motion was normal; there were  no regional wall motion abnormalities. The study is not  technically sufficient to allow evaluation of LV diastolic  function.  - Mitral  valve: Mildly thickened leaflets . There was moderate  regurgitation.  - Left atrium: The atrium was moderately dilated at 40 ml/m2.  - Right ventricle: The cavity size was mildly dilated. Systolic  function was low normal.  - Right atrium: Moderately dilated at 24 cm2.  - Inferior vena cava: The vessel was normal in size. The  respirophasic diameter changes were in the normal range  (>= 50%),  consistent with normal central venous pressure.   06/2017 Vas [place] ABI Transcribed from scan: No evidence of significant right or left lower extremity arterial disease based on ABIs.  The bilateral toe brachial indices are abnormal.  No previous ankle-brachial indices available for comparison  2016 Cath as in scan under CV procedures   Assessment & Plan    CAD s/p CABG x4  Ongoing DOE --S/p CABG x4 (06/2014) with repeat cath 2016 showing occlusions of the VG-Diag and PDA with an atretic LIMA to the LAD.  SVG-OM had 6% stenosis.  He had severe multivessel CAD and underwent atherectomy and DES placement x2 to the left main and proximal/mid LAD.    --No chest pain.  EKG without acute ST/T changes.  Reports DOE, which was also reported on review of previous 12/18/2018 progress notes.  Not consistent with before his previous caths. This only occurs with heavy activity and does seem to be stable from previous reportedly difficult to assess.  DOE also noted on stairs. He is unable to say if this sx is any worse from his clinic visit 09/30/19. Given his ongoing symptoms with murmur appreciated on exam, discussed further work-up with Lexiscan versus echo.  After further review of symptoms and discussion with patient, plan was determined to update an echo at this time.  We will reassess his EF, valves, heart pressures, and rule out any acute structural changes.  Further recommendations if needed pending echo.  Continue current medications.  Systolic murmur --Appreciated today on exam. Update echo as above to evaluate, as this could be contributing to his sx.  Essential hypertension --BP well controlled.  Continue current medications.  Hyperlipidemia -Statin intolerant.  He continues Repatha once every 2 weeks.  He does note that he forgets to take his Repatha, and this has been evidenced by his rise in LDL.  Reviewed this with him today, and he will speak with his wife about setting up alarms  in his phone to prevent him from missing Repatha doses.  He is aware that control of his LDL is important for risk factor modification and prevention of progression of CAD and PAD.  Recommend repeat LDL once he is consistently taking his Repatha.  If LDL remains uncontrolled at that time, he may benefit from referral to the lipid clinic.  This was all discussed with the patient today with patient understanding.  DM2 --Continue to follow with endocrinology.  OSA -Continue CPAP.  PAD -Reports ongoing bilateral lower extremity claudication with long periods of walking.  S/p left SFA angioplasty and previously followed by vascular surgery.  ABIs in November 2018 were stable.  He reports today that he has always had claudication symptoms and these were not improved with stenting.  Preference to defer further imaging at this time.  Instructed him to keep [place] in the loop regarding any progression of or worsening of symptoms.  LDL control recommended.  Continue ASA, Plavix, lipid management.  BMI 30.0-35.0 --Ongoing lifestyle changes encouraged.  Medication changes: None. Labs ordered: None Studies / Imaging ordered: Echo. If EF reduced or concerning  changes, Lexi versus cath. Future considerations: Repeats lipid once consistently taking Repatha Disposition: RTC 8 weeks to review symptoms and echo    Arvil Chaco, PA-C 04/04/2020

## 2020-04-04 NOTE — Patient Instructions (Signed)
Medication Instructions:  Your physician recommends that you continue on your current medications as directed. Please refer to the Current Medication list given to you today.  *If you need a refill on your cardiac medications before your next appointment, please call your pharmacy*   Lab Work: None ordered If you have labs (blood work) drawn today and your tests are completely normal, you will receive your results only by:  [place] (if you have MyChart) OR  A paper copy in the mail If you have any lab test that is abnormal or we need to change your treatment, we will call you to review the results.   Testing/Procedures: 1- Echo  Please return to [facility] on ______________ at _______________ AM/PM for an Echocardiogram. Your physician has requested that you have an echocardiogram. Echocardiography is a painless test that uses sound waves to create images of your heart. It provides your doctor with information about the size and shape of your heart and how well your hearts chambers and valves are working. This procedure takes approximately one hour. There are no restrictions for this procedure. Please note; depending on visual quality an IV may need to be placed.     Follow-Up: At [facility], you and your health needs are our priority.  As part of our continuing mission to provide you with exceptional heart care, we have created designated Provider Care Teams.  These Care Teams include your primary Cardiologist (physician) and Advanced Practice Providers (APPs -  Physician Assistants and Nurse Practitioners) who all work together to provide you with the care you need, when you need it.  We recommend signing up for the patient portal called "MyChart".  Sign up information is provided on this After Visit Summary.  MyChart is used to connect with patients for Virtual Visits (Telemedicine).  Patients are able to view lab/test results, encounter notes, upcoming  appointments, etc.  Non-urgent messages can be sent to your provider as well.   To learn more about what you can do with MyChart, go to [email or web address].    Your next appointment:   8 week(s)  The format for your next appointment:   In Person  Provider:    You may see [name], MD or Marrianne Mood, PA-C

## 2020-04-28 ENCOUNTER — Other Ambulatory Visit: Payer: Self-pay | Admitting: Radiation Oncology

## 2020-04-28 ENCOUNTER — Other Ambulatory Visit: Payer: Self-pay

## 2020-04-28 ENCOUNTER — Other Ambulatory Visit: Payer: Self-pay | Admitting: *Deleted

## 2020-04-28 ENCOUNTER — Telehealth: Payer: Self-pay | Admitting: Urology

## 2020-04-28 ENCOUNTER — Ambulatory Visit (INDEPENDENT_AMBULATORY_CARE_PROVIDER_SITE_OTHER): Payer: Medicare Other

## 2020-04-28 ENCOUNTER — Other Ambulatory Visit: Payer: Self-pay | Admitting: Cardiovascular Disease

## 2020-04-28 DIAGNOSIS — R0600 Dyspnea, unspecified: Secondary | ICD-10-CM

## 2020-04-28 DIAGNOSIS — C61 Malignant neoplasm of prostate: Secondary | ICD-10-CM

## 2020-04-28 LAB — ECHOCARDIOGRAM COMPLETE
Area-P 1/2: 3.16 cm2
S' Lateral: 2.8 cm

## 2020-04-28 MED ORDER — MIRABEGRON ER 50 MG PO TB24
50.0000 mg | ORAL_TABLET | Freq: Every day | ORAL | 12 refills | Status: DC
Start: 2020-04-28 — End: 2021-06-20

## 2020-04-28 NOTE — Telephone Encounter (Signed)
Pt stopped in office and said [facility] gave him samples of Myrbetriq.  They worked well for him and he would like a RX sent to [facility].  He wasn't sure of the mg he was given.

## 2020-05-04 ENCOUNTER — Telehealth: Payer: Self-pay

## 2020-05-04 NOTE — Telephone Encounter (Signed)
-----   Message from Arvil Chaco, PA-C sent at 05/03/2020 10:01 PM EDT ----- Echo shows normal pump function and that the walls of the heart are squeezing and relaxing appropriately. The heart chambers are of normal size and thickness. The aortic valve is mild to moderately thickened or calcified; however, it appears to be functioning normally (not leaky or stiff). Recommend we continue to monitor this valve with periodic echo.   Overall, his echo has improved from that of his 2016 study.   When compared with his study from 2016, the following was not noted as visualized in 2021: moderately leaky mitral valve with mildly thickened leaflets, mildly impaired stretch of the heart, severely thickened heart walls, moderately dilated top chambers, mildly dilated right bottom chamber, slightly leaky pulmonic valve, and low normal right heart pump function.

## 2020-05-04 NOTE — Telephone Encounter (Signed)
Call to patient to review echo results.    Pt verbalized understanding and has no further questions at this time.    Advised pt to call for any further questions or concerns.  No further orders.   

## 2020-05-04 NOTE — Telephone Encounter (Signed)
Attempted to call patient. [facility] 05/04/2020

## 2020-05-17 ENCOUNTER — Other Ambulatory Visit: Payer: Self-pay

## 2020-05-17 ENCOUNTER — Encounter
Admission: RE | Admit: 2020-05-17 | Discharge: 2020-05-17 | Disposition: A | Discharge status: Home / Self Care | Payer: Medicare Other | Source: Ambulatory Visit | Attending: Internal Medicine | Admitting: Internal Medicine

## 2020-05-17 DIAGNOSIS — C61 Malignant neoplasm of prostate: Secondary | ICD-10-CM | POA: Diagnosis not present

## 2020-05-17 MED ORDER — AXUMIN (FLUCICLOVINE F 18) INJECTION
10.5000 | Freq: Once | INTRAVENOUS | Status: AC | PRN
  Administered 2020-05-17: 10.5 via INTRAVENOUS

## 2020-05-19 ENCOUNTER — Other Ambulatory Visit: Payer: Self-pay

## 2020-05-19 ENCOUNTER — Inpatient Hospital Stay: Payer: Medicare Other | Attending: Internal Medicine

## 2020-05-19 DIAGNOSIS — Z923 Personal history of irradiation: Secondary | ICD-10-CM | POA: Insufficient documentation

## 2020-05-19 DIAGNOSIS — C61 Malignant neoplasm of prostate: Secondary | ICD-10-CM | POA: Diagnosis present

## 2020-05-19 DIAGNOSIS — E785 Hyperlipidemia, unspecified: Secondary | ICD-10-CM | POA: Insufficient documentation

## 2020-05-19 DIAGNOSIS — Z8249 Family history of ischemic heart disease and other diseases of the circulatory system: Secondary | ICD-10-CM | POA: Insufficient documentation

## 2020-05-19 DIAGNOSIS — Z87891 Personal history of nicotine dependence: Secondary | ICD-10-CM | POA: Insufficient documentation

## 2020-05-19 DIAGNOSIS — Z794 Long term (current) use of insulin: Secondary | ICD-10-CM | POA: Insufficient documentation

## 2020-05-19 DIAGNOSIS — Z79899 Other long term (current) drug therapy: Secondary | ICD-10-CM | POA: Insufficient documentation

## 2020-05-19 DIAGNOSIS — Z8042 Family history of malignant neoplasm of prostate: Secondary | ICD-10-CM | POA: Insufficient documentation

## 2020-05-19 DIAGNOSIS — Z833 Family history of diabetes mellitus: Secondary | ICD-10-CM | POA: Insufficient documentation

## 2020-05-19 DIAGNOSIS — G4733 Obstructive sleep apnea (adult) (pediatric): Secondary | ICD-10-CM | POA: Insufficient documentation

## 2020-05-19 DIAGNOSIS — I10 Essential (primary) hypertension: Secondary | ICD-10-CM | POA: Insufficient documentation

## 2020-05-19 DIAGNOSIS — I2510 Atherosclerotic heart disease of native coronary artery without angina pectoris: Secondary | ICD-10-CM | POA: Insufficient documentation

## 2020-05-19 DIAGNOSIS — Z803 Family history of malignant neoplasm of breast: Secondary | ICD-10-CM | POA: Insufficient documentation

## 2020-05-19 DIAGNOSIS — Z8673 Personal history of transient ischemic attack (TIA), and cerebral infarction without residual deficits: Secondary | ICD-10-CM | POA: Insufficient documentation

## 2020-05-19 DIAGNOSIS — E119 Type 2 diabetes mellitus without complications: Secondary | ICD-10-CM | POA: Insufficient documentation

## 2020-05-19 DIAGNOSIS — C7951 Secondary malignant neoplasm of bone: Secondary | ICD-10-CM | POA: Insufficient documentation

## 2020-05-19 DIAGNOSIS — Z7982 Long term (current) use of aspirin: Secondary | ICD-10-CM | POA: Insufficient documentation

## 2020-05-19 LAB — PSA: Prostatic Specific Antigen: 0.16 ng/mL (ref 0.00–4.00)

## 2020-05-20 ENCOUNTER — Other Ambulatory Visit: Payer: Self-pay | Admitting: Cardiovascular Disease

## 2020-05-20 DIAGNOSIS — I2510 Atherosclerotic heart disease of native coronary artery without angina pectoris: Secondary | ICD-10-CM

## 2020-05-20 DIAGNOSIS — R079 Chest pain, unspecified: Secondary | ICD-10-CM

## 2020-05-20 DIAGNOSIS — E785 Hyperlipidemia, unspecified: Secondary | ICD-10-CM

## 2020-05-20 DIAGNOSIS — I10 Essential (primary) hypertension: Secondary | ICD-10-CM

## 2020-05-20 NOTE — Telephone Encounter (Signed)
Rx request sent to pharmacy.  

## 2020-05-21 DIAGNOSIS — Z23 Encounter for immunization: Secondary | ICD-10-CM | POA: Diagnosis not present

## 2020-05-23 ENCOUNTER — Telehealth: Payer: Self-pay

## 2020-05-23 NOTE — Telephone Encounter (Signed)
Incoming benefit verification for Lupron/Eligard. No PA needed.

## 2020-05-26 ENCOUNTER — Ambulatory Visit: Payer: Medicare Other | Admitting: Radiation Oncology

## 2020-05-26 DIAGNOSIS — D3131 Benign neoplasm of right choroid: Secondary | ICD-10-CM | POA: Diagnosis not present

## 2020-05-26 LAB — HM DIABETES EYE EXAM

## 2020-05-27 ENCOUNTER — Inpatient Hospital Stay (HOSPITAL_BASED_OUTPATIENT_CLINIC_OR_DEPARTMENT_OTHER): Payer: Medicare Other | Admitting: Internal Medicine

## 2020-05-27 ENCOUNTER — Ambulatory Visit
Admission: RE | Admit: 2020-05-27 | Discharge: 2020-05-27 | Disposition: A | Discharge status: Home / Self Care | Payer: Medicare Other | Source: Ambulatory Visit | Attending: Radiation Oncology | Admitting: Radiation Oncology

## 2020-05-27 ENCOUNTER — Other Ambulatory Visit: Payer: Self-pay

## 2020-05-27 ENCOUNTER — Telehealth: Payer: Self-pay | Admitting: Pharmacy Technician

## 2020-05-27 ENCOUNTER — Encounter: Payer: Self-pay | Admitting: Radiation Oncology

## 2020-05-27 VITALS — BP 118/62 | HR 69 | Temp 96.8°F | Wt 248.0 lb

## 2020-05-27 DIAGNOSIS — I2510 Atherosclerotic heart disease of native coronary artery without angina pectoris: Secondary | ICD-10-CM | POA: Diagnosis not present

## 2020-05-27 DIAGNOSIS — Z923 Personal history of irradiation: Secondary | ICD-10-CM | POA: Diagnosis not present

## 2020-05-27 DIAGNOSIS — C61 Malignant neoplasm of prostate: Secondary | ICD-10-CM

## 2020-05-27 MED ORDER — APALUTAMIDE 60 MG PO TABS: 180.0000 mg | ORAL_TABLET | Freq: Every day | ORAL | 6 refills | Status: DC

## 2020-05-27 NOTE — Assessment & Plan Note (Addendum)
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 PET Oct 10th, 2021-Mild asymmetric activity within the prostate gland decreased from comparison from Feb 2021; No convincing evidence of nodal metastasis. No visceral metastasis or skeletal metastasis.  October 2021 PSA 0.16/improving.  # Recommend continued Lupron; [s/p June 2nd 2021-Dr. [facility] ]; recommend starting the patient on apalutamide.  Discussed with patient and wife that all the patient's PET scan is negative for any metastatic disease; he does have history of stage IV bone metastatic disease.  Discussed again that his disease is incurable; and I would recommend addition of antiendocrine therapy to slow down the progression of metastatic disease.  Discussed the potential side effects of apalutamide including but not limited to-diarrhea skin rash liver abnormalities fatigue etc.  We will start with 3 pills a day/patient preference.  patient in agreement to start oral therapy.  Bethena Roys from pharmacy met with patient.  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   # DISPOSITION:  #  Follow up in 1 months; MD- labs- cbc/cmp/PSA- Dr.B  # I reviewed the blood work- with the patient in detail; also reviewed the imaging independently [as summarized above]; and with the patient in detail.

## 2020-05-27 NOTE — Progress Notes (Signed)
Radiation Oncology Follow up Note  Name: Phillip Clements   Date:   05/27/2020 MRN:  [id number] DOB: [birth year]    This 69 y.o. male presents to the clinic today for 21-month follow-up status post salvage radiation therapy after HIFU for Gleason 6 adenocarcinoma initially presenting with a PSA of 24.7.  REFERRING PROVIDER: Tonia Ghent, MD  HPI: Patient is a 69 year old male now out 4 months from salvage radiation therapy.  Had HIFU back in 2007 for Gleason adenocarcinoma the prostate with a PSA of 24.7.  He was an interesting case and he did have some evidence of possible metastatic disease in the lumbar spine in 2008 although MRI showed no evidence of metastatic disease and other nuclear scans were negative.  He is seen today in routine follow-up continues to have nocturia x3-4 some urgency and frequency is currently on Flomax as well as Mybetrique.  His PSA continues a downward trend most recently 0.16 continue downward trend from 2 months ago at 0.21.  He is currently on androgen deprivation therapy patient is concerned about some hair loss on his body not sure the etiology of that not do not think is related to his answered androgen deprivation therapy.  Patient recently had a PET CT scan in early October showing mild asymmetric activity within the prostate gland decreased from prior examination.  Also had some several small left external iliac nodes with low tracer activity favoring benign nodes.  No convincing evidence for metastatic disease.  COMPLICATIONS OF TREATMENT: none  FOLLOW UP COMPLIANCE: keeps appointments   PHYSICAL EXAM:  BP 118/62 (Patient Position: Sitting, Cuff Size: Large)   Pulse 69   Temp (!) 96.8 F (36 C) (Tympanic)   Wt 248 lb (112.5 kg)   BMI 36.62 kg/m  Well-developed well-nourished patient in NAD. HEENT reveals PERLA, EOMI, discs not visualized.  Oral cavity is clear. No oral mucosal lesions are identified. Neck is clear without evidence of cervical or  supraclavicular adenopathy. Lungs are clear to A&P. Cardiac examination is essentially unremarkable with regular rate and rhythm without murmur rub or thrill. Abdomen is benign with no organomegaly or masses noted. Motor sensory and DTR levels are equal and symmetric in the upper and lower extremities. Cranial nerves II through XII are grossly intact. Proprioception is intact. No peripheral adenopathy or edema is identified. No motor or sensory levels are noted. Crude visual fields are within normal range.  RADIOLOGY RESULTS: MRI scan reviewed compatible with above-stated findings  PLAN: Present time patient is doing well with favorable PET/CT findings as well as continued downward trend of his PSA.  And pleased with his overall progress.  I have suggested cranberry juice low sugar is much as possible to aid in some of his urinary symptoms.  He will continue on Flomax as well as Mybetrique.  We will see him back in 6 months with a PSA at that time.  Patient knows to call sooner with any concerns.  I would like to take this opportunity to thank you for allowing me to participate in the care of your patient.Noreene Filbert, MD

## 2020-05-27 NOTE — Telephone Encounter (Signed)
Oral Oncology Patient Advocate Encounter   Received notification from [facility] that prior authorization for [facility] is required.   PA submitted on CoverMyMeds Key [id number] Status is pending   Oral Oncology Clinic will continue to follow.  [place] Patient [place] Phone [phone] Fax [phone] 05/30/2020 1:49 PM

## 2020-05-29 NOTE — Progress Notes (Signed)
[facility] CONSULT NOTE  Patient Care Team: Tonia Ghent, MD as PCP - General (Family Medicine) Wellington Hampshire, MD as PCP - Cardiology (Cardiology)  CHIEF COMPLAINTS/PURPOSE OF CONSULTATION: Prostate cancer  #  Oncology History Overview Note  1. Prostate adenocarcinoma  a. 02/2006 PSA returned elevated at 17.49  b. 04/03/2006; PSA 24.7; prostate biopsy Gleason 3+3 with 4/4 cores c. tx with HIFU overseas late 2007 in [place] [Dr.Cope]] d. persistent PSA elevation over period 2007-2010; nadir 2.1 09/16/06, peak 15.0 ng/mL  e. 04/2006 bone scan and CT abd/pelvis without evidence of metastasis f. 03/08/2007 CT abdomen/pelvis with no definitive metastatic disease noted  g. 03/18/2007 prostascint scan with no evidence of disease but small pelvic lymph nodes noted  h. 08/15/2009 prostascint scan with no evidence of local prostate cancer or distant metastases  i. 08/22/2009 PSA 15 ng/mL; 02/06/2010 PSA 20.5 ng/mL  j. 03/08/2010 PSA 20.9 ng/mL  k. 06/02/10 initiation of degarelix  l. 05/25/10 PET CT (Fluoride) revealed heterogenous areas of FDG-avidity in ribs, femurs, humeri, areas also in the metatarsals noted which may have been arthiritic in nature  m. 07/03/10 PSA 9.6 ng/mL  o.  Started Lupron/Casodex; June 2020-PSA from 0.2 to 0.3 -I;  As per patient-Casodex was withdrawn-but noted to have a jump of PSA to 0.9. FEB 2021-evidence of prostatic malignancy in the residual prostate bed; no distant metastatic disease   # FEB 2021-PET scan uptake in prostate; no bone mets.-EBRT to prostate [finished end of May 2021]; October 2021 PET scan improved prostate uptake; no evidence metastatic disease.  # early NOV-apalutamide 3pills [pt pref]  #comorbidities-CAD s/p CABG; diabetes; PVD  # Prostate cancer-at age of 20 s/p-genetic counseling-NEG.   # NGS/MOLECULAR TESTS:NA  # PALLIATIVE CARE EVALUATION: NA  # PAIN MANAGEMENT: NA   DIAGNOSIS: Castrate resistant metastatic prostate  cancer  STAGE:    4     ;  GOALS: Palliative/control  CURRENT/MOST RECENT THERAPY : Apalutamide    Prostate cancer ([place])  09/16/2007 Initial Diagnosis   Prostate cancer ([place])   01/22/2020 Genetic Testing   Negative genetic testing:  No pathogenic variants detected on the [facility] Multi-Cancer panel. The report date is 01/22/2020.   The Common Hereditary Cancers Panel offered by [facility] includes sequencing and/or deletion duplication testing of the following 48 genes: APC, ATM, AXIN2, BARD1, BMPR1A, BRCA1, BRCA2, BRIP1, CDH1, CDK4, CDKN2A (p14ARF), CDKN2A (p16INK4a), CHEK2, CTNNA1, DICER1, EPCAM (Deletion/duplication testing only), GREM1 (promoter region deletion/duplication testing only), KIT, MEN1, MLH1, MSH2, MSH3, MSH6, MUTYH, NBN, NF1, NTHL1, PALB2, PDGFRA, PMS2, POLD1, POLE, PTEN, RAD50, RAD51C, RAD51D, RNF43, SDHB, SDHC, SDHD, SMAD4, SMARCA4. STK11, TP53, TSC1, TSC2, and VHL.  The following genes were evaluated for sequence changes only: SDHA and HOXB13 c.251G>A variant only.     HISTORY OF PRESENTING ILLNESS:  Phillip Clements 69 y.o.  male with history of castrate resistant prostate cancer with Hx of  metastatic to bone/Lupron-is here for follow-up/review results of the PET scan.  Patient continues to have increased frequency of urination/dribbling.  Denies any fevers or chills.  Is currently on Flomax.  Symptoms not any better or worse.  No nausea no vomiting.   Review of Systems  Constitutional: Positive for malaise/fatigue. Negative for chills, diaphoresis, fever and weight loss.  HENT: Negative for nosebleeds and sore throat.   Eyes: Negative for double vision.  Respiratory: Negative for cough, hemoptysis, sputum production, shortness of breath and wheezing.   Cardiovascular: Negative for chest pain, palpitations, orthopnea and leg swelling.  Gastrointestinal: Negative for abdominal pain, blood in stool, constipation, diarrhea, heartburn, melena, nausea and vomiting.   Genitourinary: Positive for frequency and urgency.  Musculoskeletal: Positive for back pain. Negative for joint pain.  Skin: Negative.  Negative for itching and rash.  Neurological: Negative for dizziness, tingling, focal weakness, weakness and headaches.  Endo/Heme/Allergies: Does not bruise/bleed easily.  Psychiatric/Behavioral: Negative for depression. The patient is not nervous/anxious and does not have insomnia.      MEDICAL HISTORY:  Past Medical History:  Diagnosis Date  . Cataract    bilateral eye in 2010  . Coronary artery disease    a. CABG 04/2014: (LIMA->LAD, VG->DIAG, VG->OM2, VG->PDA)  b. 07/2014 early graft failure (VG->OM2 60, LIMA->LAD atretic, VG->PDA & VG->D1 100). s/p successful rotational atherectomy & DES of the LM & pLAD (3.0x24 Promus DES) & mLAD (2.25x24 Promus DES).  . CVA (cerebral infarction)   . Family history of prostate cancer   . Hx of adenomatous colonic polyps 06/17/2017  . Hyperlipidemia    a. Statin intolerant.  . Hypertension 2000  . Moderate mitral regurgitation    a. 02/2015 Echo: EF 55-60%, no rwma, mod MR, mod BAE, mildly dil RV w/ low nl RV fxn.  . OSA on CPAP   . Peripheral vascular disease ([place])    a. left SFA angioplasty in December 2016  . Prostate cancer ([place])   . Sleep apnea    wears CPAP nightly  . Type II diabetes mellitus ([place])     SURGICAL HISTORY: Past Surgical History:  Procedure Laterality Date  . ANTERIOR CERVICAL DECOMP/DISCECTOMY FUSION  08/25/2012   Procedure: ANTERIOR CERVICAL DECOMPRESSION/DISCECTOMY FUSION 2 LEVELS;  Surgeon: Hosie Spangle, MD;  Location: [facility]URO [facility]S;  Service: Neurosurgery;  Laterality: N/A;  Cervical five-six Cervical six-seven anterior cervical decompression with fusion and plating and bonegraft  . BACK SURGERY    . CARDIAC CATHETERIZATION  04/2014; 07/19/2014  . CORONARY ARTERY BYPASS GRAFT N/A 04/26/2014   Procedure: CORONARY ARTERY BYPASS GRAFTING (CABG), on pump, times four, using left  internal mammary artery, right greater saphenous vein harvested endoscopically.;  Surgeon: Ivin Poot, MD;  Location: [facility];  Service: Open Heart Surgery;  Laterality: N/A;  LIMA to LAD, SVG to DIAGONAL, SVG to OM1, SVG to PDA) with EVH of the RIGHT THIGH and LOWER EXTREMITY SAPHENOUS VEIN  . Cytoscopy prostatic stone o/w nml  06/21/08   Dr. Jacqlyn Larsen  . ETT myoview  09/13/09   Low risk EF 49%  . High intense focused ultrasound  07/20/06   By Dr. Jacqlyn Larsen  . INTRAOPERATIVE TRANSESOPHAGEAL ECHOCARDIOGRAM N/A 04/26/2014   Procedure: INTRAOPERATIVE TRANSESOPHAGEAL ECHOCARDIOGRAM;  Surgeon: Ivin Poot, MD;  Location: [facility];  Service: Open Heart Surgery;  Laterality: N/A;  . PERCUTANEOUS CORONARY ROTOBLATOR INTERVENTION (PCI-R) N/A 07/22/2014   Procedure: PERCUTANEOUS CORONARY ROTOBLATOR INTERVENTION (PCI-R);  Surgeon: Peter M Martinique, MD;  Location: [facility] C[facility] LAB;  Service: Cardiovascular;  Laterality: N/A;  . PERIPHERAL VASCULAR CATHETERIZATION Left 07/26/2015   Procedure: Lower Extremity Angiography;  Surgeon: Katha Cabal, MD;  Location: [facility] CV LAB;  Service: Cardiovascular;  Laterality: Left;  . PERIPHERAL VASCULAR CATHETERIZATION  07/26/2015   Procedure: Lower Extremity Intervention;  Surgeon: Katha Cabal, MD;  Location: [facility] CV LAB;  Service: Cardiovascular;;  . PROSTATE BIOPSY  04/03/06 & 02/25/07    SOCIAL HISTORY: Social History   Socioeconomic History  . Marital status: Married    Spouse name: Not on file  . Number of children:  3  . Years of education: Not on file  . Highest education level: Not on file  Occupational History  . Occupation: Insurance account manager  Tobacco Use  . Smoking status: Former Smoker    Packs/day: 1.00    Years: 36.00    Pack years: 36.00    Types: Cigarettes    Quit date: 03/01/2014    Years since quitting: 6.2  . Smokeless tobacco: Never Used  Vaping Use  . Vaping Use: Never used  Substance and Sexual  Activity  . Alcohol use: Yes    Comment: 07/19/2014 "might have a drink a couple times/yr"  . Drug use: No  . Sexual activity: Not Currently  Other Topics Concern  . 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 In [place].    Social Determinants of Health   Financial Resource Strain:   . Difficulty of Paying Living Expenses: Not on file  Food Insecurity:   . Worried About Charity fundraiser in the Last Year: Not on file  . Ran Out of Food in the Last Year: Not on file  Transportation Needs:   . Lack of Transportation (Medical): Not on file  . Lack of Transportation (Non-Medical): Not on file  Physical Activity:   . Days of Exercise per Week: Not on file  . Minutes of Exercise per Session: Not on file  Stress:   . Feeling of Stress : Not on file  Social Connections:   . Frequency of Communication with Friends and Family: Not on file  . Frequency of Social Gatherings with Friends and Family: Not on file  . Attends Religious Services: Not on file  . Active Member of Clubs or Organizations: Not on file  . Attends Archivist Meetings: Not on file  . Marital Status: Not on file  Intimate Partner Violence:   . Fear of Current or Ex-Partner: Not on file  . Emotionally Abused: Not on file  . Physically Abused: Not on file  . Sexually Abused: Not on file    FAMILY HISTORY: Family History  Problem Relation Age of Onset  . Diabetes Mother 48       DM  . Coronary artery disease Mother   . Hypertension Mother   . Heart attack Mother        CABG with MI in 2005  . Heart disease Mother        CV  . Hypertension Father   . Heart attack Father        CABG with Mi around 1997  . Coronary artery disease Father   . Heart disease Father        CV  . Diabetes Father   . Heart  attack Brother        MI/PTCA  . Heart disease Brother        CV  . Diabetes Brother   . Heart attack Maternal Grandfather        MI  . Prostate cancer Paternal Grandfather 16       likely metastatic  . Diabetes Sister        DM  . Breast cancer Neg Hx        Breast/ovarian/uterine cancer  . Colon cancer Neg Hx   . Depression Neg Hx   . Alcohol abuse  Neg Hx        ETOH/drug abuse  . Stroke Neg Hx     ALLERGIES:  is allergic to coreg [carvedilol], metformin and related, protonix [pantoprazole sodium], rosuvastatin calcium, and statins.  MEDICATIONS:  Current Outpatient Medications  Medication Sig Dispense Refill  . aspirin EC 81 MG tablet Take 1 tablet (81 mg total) by mouth daily. 90 tablet 3  . carvedilol (COREG) 3.125 MG tablet TAKE 1 TABLET BY MOUTH TWICE A DAY WITH A MEAL 180 tablet 0  . clopidogrel (PLAVIX) 75 MG tablet TAKE 1 TABLET BY MOUTH ONCE DAILY 30 tablet 2  . Dulaglutide (TRULICITY) 3 ZO/1.0RU SOPN Inject 3 mg into the skin once a week. 12 pen 3  . ezetimibe (ZETIA) 10 MG tablet Take 1 tablet (10 mg total) by mouth daily. PLEASE CALL OFFICE TO SCHEDULE APPOINTMENT FOR FURTHER REFILLS. 30 tablet 1  . glucose blood (ONETOUCH ULTRA) test strip CHECK BLOOD SUGAR TWICE A DAY - DX E11.59 200 strip 3  . Injection Device ([place]) MISC Frequency:PHARMDIR   Dosage:0.0     Instructions:  Note:diagnosis 250.02 Dose: NA    . insulin degludec (TRESIBA FLEXTOUCH) 200 UNIT/ML FlexTouch Pen Inject 44 Units into the skin daily. 9 pen 3  . Insulin Pen Needle 32G X 4 MM MISC Use 1x a day 100 each 3  . INVOKANA 300 MG TABS tablet TAKE 1 TABLET BY MOUTH ONCE A DAY WITH BREAKFAST 90 tablet 3  . leuprolide (LUPRON) 30 MG injection Inject 45 mg into the muscle every 6 (six) months.    [place] Kitchen losartan (COZAAR) 25 MG tablet TAKE 1/2 TABLET BY MOUTH ONCE A DAY 45 tablet 0  . meclizine (ANTIVERT) 25 MG tablet Take 0.5-1 tablets (12.5-25 mg total) by mouth 2 (two) times daily as  needed for dizziness or nausea. 30 tablet 0  . mirabegron ER (MYRBETRIQ) 50 MG TB24 tablet Take 1 tablet (50 mg total) by mouth daily. 30 tablet 12  . nitroGLYCERIN (NITROSTAT) 0.4 MG SL tablet Place 1 tablet (0.4 mg total) under the tongue as needed. 25 tablet 3  . REPATHA SURECLICK 045 MG/ML SOAJ INJECT ONE PEN INTO THE SKIN EVERY 14 DAYS 2 mL 3  . tamsulosin (FLOMAX) 0.4 MG CAPS capsule TAKE 1 CAPSULE BY MOUTH ONCE DAILY AFTERSUPPER 30 capsule 11  . apalutamide (ERLEADA) 60 MG tablet Take 3 tablets (180 mg total) by mouth daily. 90 tablet 6   No current facility-administered medications for this visit.      Marland Kitchen  PHYSICAL EXAMINATION: ECOG PERFORMANCE STATUS: 0 - Asymptomatic  Vitals:   05/27/20 1102  BP: 118/62  Pulse: 69  Resp: 16  Temp: (!) 96.8 F (36 C)   Filed Weights   05/27/20 1102  Weight: 248 lb (112.5 kg)    Physical Exam HENT:     Head: Normocephalic and atraumatic.     Mouth/Throat:     Pharynx: No oropharyngeal exudate.  Eyes:     Pupils: Pupils are equal, round, and reactive to light.  Cardiovascular:     Rate and Rhythm: Normal rate and regular rhythm.  Pulmonary:     Effort: Pulmonary effort is normal. No respiratory distress.     Breath sounds: Normal breath sounds. No wheezing.  Abdominal:     General: Bowel sounds are normal. There is no distension.     Palpations: Abdomen is soft. There is no mass.     Tenderness: There is no abdominal tenderness. There is no  guarding or rebound.  Musculoskeletal:        General: No tenderness. Normal range of motion.     Cervical back: Normal range of motion and neck supple.  Skin:    General: Skin is warm.  Neurological:     Mental Status: He is alert and oriented to person, place, and time.  Psychiatric:        Mood and Affect: Affect normal.     LABORATORY DATA:  I have reviewed the data as listed Lab Results  Component Value Date   WBC 5.2 03/17/2020   HGB 14.7 03/17/2020   HCT 41.2 03/17/2020    MCV 83.7 03/17/2020   PLT 171 03/17/2020   Recent Labs    10/09/19 0952 03/17/20 1012  NA 139 135  K 4.2 4.0  CL 103 102  CO2 28 24  GLUCOSE 201* 157*  BUN 23 17  CREATININE 0.91 0.69  CALCIUM 8.7* 8.6*  GFRNONAA >60 >60  GFRAA >60 >60  PROT 6.8 6.5  ALBUMIN 4.0 3.9  AST 12* 14*  ALT 20 18  ALKPHOS 63 65  BILITOT 0.9 0.7    RADIOGRAPHIC STUDIES: I have personally reviewed the radiological images as listed and agreed with the findings in the report. NM PET (AXUMIN) SKULL BASE TO MID THIGH  Result Date: 05/17/2020 CLINICAL DATA:  Prostate cancer with biochemical recurrence. Seed implantation 2021. Prior HIFU treatment. EXAM: NUCLEAR MEDICINE PET SKULL BASE TO THIGH TECHNIQUE: 10.5 mCi F-18 Fluciclovine was injected intravenously. Full-ring PET imaging was performed from the skull base to thigh after the radiotracer. CT data was obtained and used for attenuation correction and anatomic localization. COMPARISON:  Axumin scan 10/04/2019 FINDINGS: NECK No radiotracer activity in neck lymph nodes. Incidental CT finding: None CHEST No radiotracer accumulation within mediastinal or hilar lymph nodes. No suspicious pulmonary nodules on the CT scan. Incidental CT finding: None ABDOMEN/PELVIS Prostate: Mild asymmetric activity within the prostate gland favoring the RIGHT side with SUV max equal 4.8. Activity is decreased from comparison exam (SUV max equal 6.3). No abnormal radiotracer activity Lymph nodes: Mild radiotracer activity associated with a LEFT external iliac lymph node with SUV max equal 2.1 and measuring 6 mm image 237/3. Mild activity associated smaller LEFT externally iliac lymph node with SUV max equal 1.6 on image 104). This node measures 5 mm. No enlarged radiotracer avid retroperitoneal nodes. Liver: No evidence of liver metastasis Incidental CT finding: Atherosclerotic calcification of the aorta. SKELETON No focal  activity to suggest skeletal metastasis. IMPRESSION: 1. Mild  asymmetric activity within the prostate gland decreased from comparison exam. 2. Several small LEFT external iliac lymph node with low radiotracer activity. Favor benign nodes. 3. No convincing evidence of nodal metastasis. No visceral metastasis or skeletal metastasis Electronically Signed   By: Suzy Bouchard M.D.   On: 05/17/2020 17:05    ASSESSMENT & PLAN:   Prostate cancer ([place]) # STAGE-IV-Prostate cancer-metastatic to bone/castrate resistant.S/P- EBRT to prostate [last Tx- on 01/08/2020]. PET Oct 10th, 2021-Mild asymmetric activity within the prostate gland decreased from comparison from Feb 2021; No convincing evidence of nodal metastasis. No visceral metastasis or skeletal metastasis.  October 2021 PSA 0.16/improving.  # Recommend continued Lupron; [s/p June 2nd 2021-Dr. [facility] ]; recommend starting the patient on apalutamide.  Discussed with patient and wife that all the patient's PET scan is negative for any metastatic disease; he does have history of stage IV bone metastatic disease.  Discussed again that his disease is incurable; and I would  recommend addition of antiendocrine therapy to slow down the progression of metastatic disease.  Discussed the potential side effects of apalutamide including but not limited to-diarrhea skin rash liver abnormalities fatigue etc.  We will start with 3 pills a day/patient preference.  patient in agreement to start oral therapy.  Bethena Roys from pharmacy met with patient.  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   # DISPOSITION:  #  Follow up in 1 months; MD- labs- cbc/cmp/PSA- Dr.B  # I reviewed the blood work- with the patient in detail; also reviewed the imaging independently [as summarized above]; and with the patient in detail.      All questions were answered. The patient knows to call the clinic with any problems, questions or concerns.    Cammie Sickle, MD 05/29/2020 8:57 AM

## 2020-05-30 ENCOUNTER — Telehealth: Payer: Self-pay | Admitting: Pharmacist

## 2020-05-30 DIAGNOSIS — C61 Malignant neoplasm of prostate: Secondary | ICD-10-CM

## 2020-05-30 NOTE — Telephone Encounter (Signed)
Oral Oncology Pharmacist Encounter  Received new prescription for Nubeqa (darolutamide) for the treatment of castrate resistant prostate cancer in conjunction with Lupron, planned duration until disease progression or unacceptable drug toxicity.  Originally discussed treating with apalutamide, but the apalutamide had a DDI with clopidogrel.   Prescription dose and frequency assessed.   Current medication list in Epic reviewed, no DDIs with darolutamide identified.  Evaluated chart and no patient barriers to medication adherence identified.   Prescription has been e-scribed to the [facility] for benefits analysis and approval.  Oral Oncology Clinic will continue to follow for insurance authorization, copayment issues, initial counseling and start date.  Darl Pikes, PharmD, BCPS, BCOP, CPP Hematology/Oncology Clinical Pharmacist Practitioner [facility]/HP/AP Oral [facility] [phone]  05/30/2020 1:06 PM

## 2020-05-31 ENCOUNTER — Telehealth: Payer: Self-pay | Admitting: Pharmacy Technician

## 2020-05-31 MED ORDER — DAROLUTAMIDE 300 MG PO TABS: ORAL_TABLET | ORAL | 0 refills | Status: DC

## 2020-05-31 NOTE — Telephone Encounter (Signed)
Oral Oncology Patient Advocate Encounter  Received notification from [facility] that prior authorization for Nubeqa is required.  PA submitted on CoverMyMeds Key [id number] Status is pending  Oral Oncology Clinic will continue to follow.  [place] Patient [place] Phone [phone] Fax [phone] 05/31/2020 9:55 AM

## 2020-05-31 NOTE — Telephone Encounter (Signed)
Oral Oncology Patient Advocate Encounter  Prior Authorization for [facility] has been approved.    PA# [id number] Effective dates: 05/31/20 through 11/27/20  Patients co-pay is $673.75.  Oral Oncology Clinic will continue to follow.    [place] Patient [place] Phone [phone] Fax [phone] 05/31/2020 8:28 AM

## 2020-06-01 ENCOUNTER — Other Ambulatory Visit: Payer: Self-pay | Admitting: Cardiovascular Disease

## 2020-06-01 DIAGNOSIS — E785 Hyperlipidemia, unspecified: Secondary | ICD-10-CM

## 2020-06-01 DIAGNOSIS — I2510 Atherosclerotic heart disease of native coronary artery without angina pectoris: Secondary | ICD-10-CM

## 2020-06-01 DIAGNOSIS — R079 Chest pain, unspecified: Secondary | ICD-10-CM

## 2020-06-01 DIAGNOSIS — I10 Essential (primary) hypertension: Secondary | ICD-10-CM

## 2020-06-01 NOTE — Telephone Encounter (Signed)
Oral Oncology Patient Advocate Encounter  Prior Authorization for Phillip Clements has been approved.    PA# [id number] Effective dates: 05/31/20 through 11/27/20  Patients co-pay is $468.44 (#90 tablets for 30 day supply)  Oral Oncology Clinic will continue to follow.   [name] Patient [place] Phone [phone] Fax [phone] 06/01/2020 11:00 AM

## 2020-06-03 ENCOUNTER — Encounter: Payer: Self-pay | Admitting: Physician Assistant

## 2020-06-03 ENCOUNTER — Ambulatory Visit (INDEPENDENT_AMBULATORY_CARE_PROVIDER_SITE_OTHER): Payer: Medicare Other | Admitting: Physician Assistant

## 2020-06-03 ENCOUNTER — Other Ambulatory Visit: Payer: Self-pay

## 2020-06-03 VITALS — BP 120/60 | HR 69 | Ht 69.0 in | Wt 241.5 lb

## 2020-06-03 DIAGNOSIS — G4733 Obstructive sleep apnea (adult) (pediatric): Secondary | ICD-10-CM | POA: Diagnosis not present

## 2020-06-03 DIAGNOSIS — I739 Peripheral vascular disease, unspecified: Secondary | ICD-10-CM

## 2020-06-03 DIAGNOSIS — E119 Type 2 diabetes mellitus without complications: Secondary | ICD-10-CM | POA: Diagnosis not present

## 2020-06-03 DIAGNOSIS — Z794 Long term (current) use of insulin: Secondary | ICD-10-CM

## 2020-06-03 DIAGNOSIS — I10 Essential (primary) hypertension: Secondary | ICD-10-CM

## 2020-06-03 DIAGNOSIS — E785 Hyperlipidemia, unspecified: Secondary | ICD-10-CM | POA: Diagnosis not present

## 2020-06-03 DIAGNOSIS — Z789 Other specified health status: Secondary | ICD-10-CM

## 2020-06-03 DIAGNOSIS — Z9989 Dependence on other enabling machines and devices: Secondary | ICD-10-CM | POA: Diagnosis not present

## 2020-06-03 DIAGNOSIS — I2510 Atherosclerotic heart disease of native coronary artery without angina pectoris: Secondary | ICD-10-CM | POA: Diagnosis not present

## 2020-06-03 DIAGNOSIS — R0600 Dyspnea, unspecified: Secondary | ICD-10-CM | POA: Diagnosis not present

## 2020-06-03 MED ORDER — NITROGLYCERIN 0.4 MG SL SUBL: 0.4000 mg | SUBLINGUAL_TABLET | SUBLINGUAL | 2 refills | Status: DC | PRN

## 2020-06-03 NOTE — Progress Notes (Signed)
Office Visit    Patient Name: Phillip Clements Date of Encounter: 06/03/2020  Primary Care Provider:  Tonia Ghent, MD Primary Cardiologist:  Phillip Sacramento, MD  Chief Complaint    Chief Complaint  Patient presents with  . OTHER    8 wk f/u no complaints today. Meds reviewed verbally with pt.    69 year old male with history of CAD s/p CABG, PAD s/p left FSA angioplasty, hypertension, hyperlipidemia, CVA, DM 2, obesity, stroke, MR, OSA, prostate cancer, and who presents today for 8-week follow-up.  Past Medical History    Past Medical History:  Diagnosis Date  . Cataract    bilateral eye in 2010  . Coronary artery disease    a. CABG 04/2014: (LIMA->LAD, VG->DIAG, VG->OM2, VG->PDA)  b. 07/2014 early graft failure (VG->OM2 60, LIMA->LAD atretic, VG->PDA & VG->D1 100). s/p successful rotational atherectomy & DES of the LM & pLAD (3.0x24 Promus DES) & mLAD (2.25x24 Promus DES).  . CVA (cerebral infarction)   . Family history of prostate cancer   . Hx of adenomatous colonic polyps 06/17/2017  . Hyperlipidemia    a. Statin intolerant.  . Hypertension 2000  . Moderate mitral regurgitation    a. 02/2015 Echo: EF 55-60%, no rwma, mod MR, mod BAE, mildly dil RV w/ low nl RV fxn.  . OSA on CPAP   . Peripheral vascular disease ([place])    a. left SFA angioplasty in December 2016  . Prostate cancer ([place])   . Sleep apnea    wears CPAP nightly  . Type II diabetes mellitus ([place])    Past Surgical History:  Procedure Laterality Date  . ANTERIOR CERVICAL DECOMP/DISCECTOMY FUSION  08/25/2012   Procedure: ANTERIOR CERVICAL DECOMPRESSION/DISCECTOMY FUSION 2 LEVELS;  Surgeon: Phillip Spangle, MD;  Location: [facility];  Service: Neurosurgery;  Laterality: N/A;  Cervical five-six Cervical six-seven anterior cervical decompression with fusion and plating and bonegraft  . BACK SURGERY    . CARDIAC CATHETERIZATION  04/2014; 07/19/2014  . CORONARY ARTERY BYPASS GRAFT N/A 04/26/2014    Procedure: CORONARY ARTERY BYPASS GRAFTING (CABG), on pump, times four, using left internal mammary artery, right greater saphenous vein harvested endoscopically.;  Surgeon: Phillip Poot, MD;  Location: [facility];  Service: Open Heart Surgery;  Laterality: N/A;  LIMA to LAD, SVG to DIAGONAL, SVG to OM1, SVG to PDA) with EVH of the RIGHT THIGH and LOWER EXTREMITY SAPHENOUS VEIN  . Cytoscopy prostatic stone o/w nml  06/21/08   Phillip Clements  . ETT myoview  09/13/09   Low risk EF 49%  . High intense focused ultrasound  07/20/06   By Phillip Clements  . INTRAOPERATIVE TRANSESOPHAGEAL ECHOCARDIOGRAM N/A 04/26/2014   Procedure: INTRAOPERATIVE TRANSESOPHAGEAL ECHOCARDIOGRAM;  Surgeon: Phillip Poot, MD;  Location: [facility];  Service: Open Heart Surgery;  Laterality: N/A;  . PERCUTANEOUS CORONARY ROTOBLATOR INTERVENTION (PCI-R) N/A 07/22/2014   Procedure: PERCUTANEOUS CORONARY ROTOBLATOR INTERVENTION (PCI-R);  Surgeon: Phillip M Martinique, MD;  Location: [facility] CATH LAB;  Service: Cardiovascular;  Laterality: N/A;  . PERIPHERAL VASCULAR CATHETERIZATION Left 07/26/2015   Procedure: Lower Extremity Angiography;  Surgeon: Phillip Cabal, MD;  Location: [facility] CV LAB;  Service: Cardiovascular;  Laterality: Left;  . PERIPHERAL VASCULAR CATHETERIZATION  07/26/2015   Procedure: Lower Extremity Intervention;  Surgeon: Phillip Cabal, MD;  Location: [facility] CV LAB;  Service: Cardiovascular;;  . PROSTATE BIOPSY  04/03/06 & 02/25/07    Allergies  Allergies  Allergen Reactions  . Coreg [Carvedilol]  Other (See Comments)    Fatigue- unclear if this was due to coreg specifically or beta blockers in general.    . Metformin And Related Nausea Only  . Protonix [Pantoprazole Sodium] Diarrhea  . Rosuvastatin Calcium     REACTION: JOINT ACHES  . Statins     REACTION: JOINT ACHES    History of Present Illness    Phillip Clements is a 69 y.o. male with PMH as above.  He has known history of diabetes for at least 26 years in  duration.  He is also s/p left SFA angioplasty in December 2016.  He also has a history of tobacco use, obesity, prostate cancer, and hyperlipidemia.  He is s/p CABG x4 in 04/2014 for severe three-vessel CAD.  EF was 45%.  He developed recurrent angina and 06/2014.  He underwent nuclear stress test which was abnormal.  He had inferolateral ST elevation with exercise.  Nuclear imaging showed evidence of inferior and anterior ischemia.  Repeat catheterization showed occluded grafts except for his SVG to OM.  He had 6% stenosis in the vein graft to the obtuse marginal with occluded vein graft to the diagonal and PDA.  His LIMA to LAD was atretic.  EF 55%.  He underwent atherectomy and stenting of the left main and LAD.  He is intolerant to statins.  He is currently on Repatha.  Of note, he has a history of melena in 04/2017.  Colonoscopy showed multiple polyps that were resected.  He follows with Phillip Clements of endocrinology for his diabetes.    He follows with Phillip Clements of oncology for prostate cancer.  PET scan 09/2019 with no uptake to bones but uptake in prostate beds.  He is currently on EBRT to prostate (last tx 12/2019).  He was seen by oncology 03/2020 with Lupron continued. He was last seen by urology 03/2020 with Myrbetriq 50mg  samples provided for new lower urinary tract symptoms 1 month after completing his radiation and described as storage related/ increased frequency.    He was seen 12/2018 and noted dyspnea at higher levels of activity or when walking up hills.  He also noted mild bilateral discomfort after prolonged periods of walking, similar to prior claudication.  He was not interested in repeat imaging.  He was seen in clinic 09/30/2019 with Phillip Montana, NP, at which time he reported stable symptoms.  He noted dyspnea with exertion only on stairs.  It was suspected this was due to deconditioning and the aging process.  He denied any shortness of breath at rest or walking around the  house/ projects.  EKG without acute changes.  He was continued on medications.  He continued on Repatha and Zetia.  Fasting lipid panel was recommended at return.  CPAP,  Diet, and exercise were recommended.   He retired in January 2021 and purchased 19 acres in [place], [place] with intention to build a house there.  He was working on clearing out the previous [employer] and doing lots of work in the yard.  He noted DOE ~ 40m into carrying heavy wood, attributed to deconditioning.  He had DOE with stairs. He did not have a regular exercise routine.  He had stable bilateral calf discomfort, especially after prolonged periods of walking, which had not improved with his intervention.  He reported a healthy diet and occasionally forgetting to take Repatha.  His most recent LDL was not well controlled, and this was discussed with patient preference to first attempt more consistent  Repatha dosing before referral to the lipid clinic.    Repeat 04/28/20 echo showed EF 55 to 60%, mild to moderate AV sclerosis without stenosis.  He returns to clinic today, 06/03/20, and is joined by his wife.  Notes continues to note dyspnea with stairs, unchanged from previous visit.  He denies any chest pain, palpitations, or racing heart rate.  No presyncope, syncope, or falls.  No increased lower extremity edema, abdominal distention, early satiety, PND, orthopnea, or weight gain noted.  Overall, he feels the same as he did his previous visit.  He continues to note symptoms of claudication as noted above with preference to think about updating arterial scans.  He denies any signs or symptoms of bleeding.  He reports that he has not yet gotten a routine down with his Repatha but intends to do so going forward with his wife's agreement today to assist him.  Otherwise, he reports medication compliance.  He continues to work on his 16 acres and is out there every day clearing the previous owners belongings from the land  as above. Results of echo reviewed.  Home Medications    Prior to Admission medications   Medication Sig Start Date End Date Taking? Authorizing Provider  aspirin EC 81 MG tablet Take 1 tablet (81 mg total) by mouth daily. 11/01/14  Yes Wellington Hampshire, MD  carvedilol (COREG) 3.125 MG tablet Take 1 tablet (3.125 mg total) by mouth 2 (two) times daily with a meal. 02/26/20  Yes Wellington Hampshire, MD  clopidogrel (PLAVIX) 75 MG tablet Take 1 tablet (75 mg total) by mouth daily. 11/02/19  Yes Wellington Hampshire, MD  Dulaglutide (TRULICITY) 3 PR/9.1MB SOPN Inject 3 mg into the skin once a week. 06/26/19  Yes Philemon Kingdom, MD  ezetimibe (ZETIA) 10 MG tablet Take 1 tablet (10 mg total) by mouth daily. PLEASE CALL OFFICE TO SCHEDULE APPOINTMENT FOR FURTHER REFILLS. 03/21/20  Yes Wellington Hampshire, MD  glucose blood (ONETOUCH ULTRA) test strip CHECK BLOOD SUGAR TWICE A DAY - DX E11.59 06/26/19  Yes Philemon Kingdom, MD  Injection Device ([place]) MISC Frequency:PHARMDIR   Dosage:0.0     Instructions:  Note:diagnosis 250.02 Dose: NA 09/28/08  Yes [provider]  insulin degludec (TRESIBA FLEXTOUCH) 200 UNIT/ML FlexTouch Pen Inject 44 Units into the skin daily. 02/25/20  Yes Philemon Kingdom, MD  Insulin Pen Needle 32G X 4 MM MISC Use 1x a day 06/26/19  Yes Philemon Kingdom, MD  INVOKANA 300 MG TABS tablet TAKE 1 TABLET BY MOUTH ONCE A DAY WITH BREAKFAST 03/21/20  Yes Philemon Kingdom, MD  leuprolide (LUPRON) 30 MG injection Inject 45 mg into the muscle every 6 (six) months.   Yes [provider]  losartan (COZAAR) 25 MG tablet TAKE 1/2 TABLET BY MOUTH ONCE A DAY 03/22/20  Yes Theora Gianotti, NP  meclizine (ANTIVERT) 25 MG tablet Take 0.5-1 tablets (12.5-25 mg total) by mouth 2 (two) times daily as needed for dizziness or nausea. 01/26/19  Yes Ria Bush, MD  Mirabegron (MYRBETRIQ PO) Take by mouth daily.   Yes [provider]  nitroGLYCERIN  (NITROSTAT) 0.4 MG SL tablet Place 1 tablet (0.4 mg total) under the tongue as needed. 09/30/19  Yes Loel Dubonnet, NP  REPATHA SURECLICK 846 MG/ML SOAJ INJECT ONE PEN INTO THE SKIN EVERY 14 DAYS 12/10/19  Yes Wellington Hampshire, MD  tamsulosin (FLOMAX) 0.4 MG CAPS capsule Take 1 capsule (0.4 mg total) by mouth daily after supper.  02/22/20  Yes Chrystal, Eulas Post, MD    Review of Systems    He denies chest pain, palpitations, pnd, orthopnea, n, v, dizziness, syncope, edema, weight gain, or early satiety. He reports DOE with 15 stairs, stable from his previous visit.  He reports SOB with lung auscultation today.  He reports ongoing bilateral calf pain with longer episodes of walking and similar to previous reported claudication/cold feet- all other systems reviewed and are otherwise negative except as noted above.  Physical Exam    VS:  BP 120/60 (BP Location: Left Arm, Patient Position: Sitting, Cuff Size: Normal)   Pulse 69   Ht 5\' 9"  (1.753 m)   Wt 241 lb 8 oz (109.5 kg)   SpO2 99%   BMI 35.66 kg/m  , BMI Body mass index is 35.66 kg/m. GEN: Well nourished, well developed, in no acute distress. HEENT: normal. Neck: Supple, no JVD, carotid bruits, or masses. Cardiac: AVW,9/7 systolic murmur murmurs, rubs, or gallops. No clubbing, cyanosis, moderate bilateral edema.  Radials/DP/PT 2+ and equal bilaterally.  Respiratory:  Respirations regular and unlabored though SOB noted towards end of exam, clear to auscultation bilaterally. GI: Soft, nontender, nondistended, BS + x 4. MS: no deformity or atrophy. Skin: warm and dry, no rash. Neuro:  Strength and sensation are intact. Psych: Normal affect.  Accessory Clinical Findings    ECG personally reviewed by me today - NSR, 69bpm, QRS 102 ms, QTC 430 ms, PR interval 184 ms, previously noted inferior infarct, EKG reviewed with DOD to given changes noted in ST segment of inferior leads, T wave of aVL, and ST segment of V4 to V6-confirmed as no acute  changes.  VITALS Reviewed today   Temp Readings from Last 3 Encounters:  05/27/20 (!) 96.8 F (36 C) (Tympanic)  05/27/20 (!) 96.8 F (36 C)  03/17/20 (!) 96.5 F (35.8 C) (Tympanic)   BP Readings from Last 3 Encounters:  06/03/20 120/60  05/27/20 118/62  05/27/20 118/62   Pulse Readings from Last 3 Encounters:  06/03/20 69  05/27/20 69  05/27/20 69    Wt Readings from Last 3 Encounters:  06/03/20 241 lb 8 oz (109.5 kg)  05/27/20 248 lb (112.5 kg)  05/27/20 248 lb (112.5 kg)     LABS  reviewed today   Lab Results  Component Value Date   WBC 5.2 03/17/2020   HGB 14.7 03/17/2020   HCT 41.2 03/17/2020   MCV 83.7 03/17/2020   PLT 171 03/17/2020   Lab Results  Component Value Date   CREATININE 0.69 03/17/2020   BUN 17 03/17/2020   NA 135 03/17/2020   K 4.0 03/17/2020   CL 102 03/17/2020   CO2 24 03/17/2020   Lab Results  Component Value Date   ALT 18 03/17/2020   AST 14 (L) 03/17/2020   ALKPHOS 65 03/17/2020   BILITOT 0.7 03/17/2020   Lab Results  Component Value Date   CHOL 166 10/09/2019   HDL 42 10/09/2019   LDLCALC 96 10/09/2019   LDLDIRECT 157.1 09/07/2013   TRIG 138 10/09/2019   CHOLHDL 4.0 10/09/2019    Lab Results  Component Value Date   HGBA1C 8.2 (A) 02/25/2020   Lab Results  Component Value Date   TSH 2.740 07/07/2014     STUDIES/PROCEDURES reviewed today   Echo 04/28/20 1. Left ventricular ejection fraction, by estimation, is 55 to 60%. The  left ventricle has normal function. The left ventricle has no regional  wall motion abnormalities. Left ventricular diastolic  parameters were  normal. The average left ventricular  global longitudinal strain is -18.1 %. The global longitudinal strain is  normal.  2. Right ventricular systolic function is normal. The right ventricular  size is normal. There is normal pulmonary artery systolic pressure.  3. The mitral valve is normal in structure. No evidence of mitral valve   regurgitation. No evidence of mitral stenosis.  4. The aortic valve is tricuspid. Aortic valve regurgitation is not  visualized. Mild to moderate aortic valve sclerosis/calcification is  present, without any evidence of aortic stenosis.  5. The inferior vena cava is normal in size with greater than 50%  respiratory variability, suggesting right atrial pressure of 3 mmHg.   06/2017 Vas [place] ABI Transcribed from scan: No evidence of significant right or left lower extremity arterial disease based on ABIs.  The bilateral toe brachial indices are abnormal.  No previous ankle-brachial indices available for comparison  2016 Cath as in scan under CV procedures   Assessment & Plan   CAD s/p CABG x4  Stable DOE --S/p CABG x4 (06/2014) with repeat cath 2016 showing occlusions of the VG-Diag and PDA with an atretic LIMA to the LAD.  SVG-OM had 6% stenosis.  He had severe multivessel CAD and underwent atherectomy and DES placement x2 to the left main and proximal/mid LAD.    --No chest pain. Stable DOE.  DOE reported not consistent with before his previous caths.  Overall, he feels he is doing well from a cardiac standpoint.  EGK reviewed with DOD and without acute changes.  He has not needed his SL nitro, which will be renewed today given his current nitro is suspected as expired.  Echo as above with nl pump function, wall motion, and heart pressures.  No further work-up at this time.  He will call the office if he notices any increased DOE or concerning symptoms, at which time we can reassess if further work-up needed.  Continue current medications.    Essential hypertension --BP well controlled.  Continue current medications.  Hyperlipidemia -Statin intolerant.  He continues Repatha once every 2 weeks.  He does note that he forgets to take his Repatha, and this has been evidenced by his rise in LDL.  His wife will help to set up alarms in his mobile phone to prevent him from missing Repatha doses.   He is aware that control of his LDL is important for risk factor modification and prevention of progression of CAD and PAD.  Recommend repeat LDL in ~ 6-8 weeks or once he is consistently taking his Repatha.  If LDL remains uncontrolled at that time, he may benefit from referral to the lipid clinic.    DM2 --Continue to follow with endocrinology.  OSA -Continue CPAP.  PAD -Reports ongoing bilateral lower extremity claudication with long periods of walking.  S/p left SFA angioplasty and previously followed by vascular surgery.  ABIs in November 2018 were stable.  He reports today that he has always had claudication symptoms and these were not improved with stenting.  Preference is still to defer further imaging at this time.  Instructed him to keep [place] in the loop regarding any progression of or worsening of symptoms.  LDL control recommended.  Continue ASA, Plavix, lipid management.  BMI 30.0-35.0 --Ongoing lifestyle changes encouraged.  Medication changes: None. Labs ordered: None Studies / Imaging ordered: None Future considerations: Repeats lipid once consistently taking Repatha Disposition: RTC 6 months or sooner if needed    Arvil Chaco,  PA-C 06/03/2020

## 2020-06-03 NOTE — Patient Instructions (Signed)
Medication Instructions:  - Your physician recommends that you continue on your current medications as directed. Please refer to the Current Medication list given to you today.  *If you need a refill on your cardiac medications before your next appointment, please call your pharmacy*   Lab Work: - Your physician recommends that you return for a FASTING lipid/ liver profile: in 6 months (around mid April 2022).  - The nurse will mail you a reminder letter for this closer to time  If you have labs (blood work) drawn today and your tests are completely normal, you will receive your results only by: Marland Kitchen MyChart Message (if you have MyChart) OR . A paper copy in the mail If you have any lab test that is abnormal or we need to change your treatment, we will call you to review the results.   Testing/Procedures: - none ordered   Follow-Up: At [facility], you and your health needs are our priority.  As part of our continuing mission to provide you with exceptional heart care, we have created designated Provider Care Teams.  These Care Teams include your primary Cardiologist (physician) and Advanced Practice Providers (APPs -  Physician Assistants and Nurse Practitioners) who all work together to provide you with the care you need, when you need it.  We recommend signing up for the patient portal called "MyChart".  Sign up information is provided on this After Visit Summary.  MyChart is used to connect with patients for Virtual Visits (Telemedicine).  Patients are able to view lab/test results, encounter notes, upcoming appointments, etc.  Non-urgent messages can be sent to your provider as well.   To learn more about what you can do with MyChart, go to [email or web address].    Your next appointment:   6 month(s)  The format for your next appointment:   In Person  Provider:   You may see [name], MD or one of the following Advanced Practice Providers on your designated Care Team:     Murray Hodgkins, NP  Christell Faith, PA-C  Marrianne Mood, PA-C  Cadence Kathlen Mody, [place]    Other Instructions  1) Please set up a reminder on your phone/ calendar to take your Repatha every 2 weeks  2) Call if symptoms of claudication "leg pain" worsen

## 2020-06-07 ENCOUNTER — Telehealth: Payer: Self-pay

## 2020-06-07 NOTE — Telephone Encounter (Signed)
Called Santiago Glad back with [employer] to see what information was missing.  She thought the patient consent form was missing but did see it.  Patient should receive a call in 24-48 hours to schedule shipment of his free trial of Nubeqa.  [place] Patient [place] Phone [phone] Fax [phone] 06/07/2020 4:08 PM

## 2020-06-07 NOTE — Telephone Encounter (Signed)
Santiago Glad with access services left a VM stating that she was missing patient info for the Nubeqa and asked for a call back in regards [phone]. Can you help with this?

## 2020-06-09 ENCOUNTER — Encounter: Payer: Self-pay | Admitting: Family Medicine

## 2020-06-13 ENCOUNTER — Telehealth: Payer: Self-pay | Admitting: Pharmacy Technician

## 2020-06-13 NOTE — Telephone Encounter (Signed)
Oral Oncology Patient Advocate Encounter  Called and spoke with patient about signing up for a free trial of Nubeqa.   Patient came by office and signed the application on 62/70/35 and I faxed to [employer] by [place] at [phone].  I called [facility] to check status of application on 37/16 and the pharmacy was going to reach out to the patient to set up shipment.  I called the patient to let him know the pharmacy would be calling him to set up shipment.  I called Mr [place] on 06/13/20 to see if he had received the free trial or had scheduled delivery and he stated he had not received the medication and had not spoke to the pharmacy yet.  I called [facility] to check to see if there was an issue and they reached out to [facility] pharmacy to check with them.  [facility] documented calling the patient and leaving messages on 10/27 and 10/29.  I verified the phone number and it was correct.  They are going to reach out to Mr Marsala again today.  I called Mr Witzke back to let him know they would be calling and gave the number to [facility] in case they have not called him by the afternoon. ([facility] [phone]).  I will check back with Mr Spieker and [facility] to make sure patient has set up shipment.  [place] Patient [place] Phone [phone] Fax [phone] 06/13/2020 11:22 AM

## 2020-06-14 ENCOUNTER — Telehealth: Payer: Self-pay | Admitting: Pharmacist

## 2020-06-14 NOTE — Telephone Encounter (Signed)
Called [facility] pharmacy to check shipment status.  Rep stated that free trial had been scheduled to be delivered today.  They provided me with the tracking number for [employer].  Tracking number showed package was delivered at 12:01pm today, 06/14/20.  Eddie Candle, pharmacy resident, will be calling patient to educate on medication and dosing.  [place] Patient [place] Phone [phone] Fax [phone] 06/14/2020 2:20 PM

## 2020-06-14 NOTE — Telephone Encounter (Addendum)
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  Pt is doing well. Counseled patient on administration, dosing, side effects, monitoring, drug-food interactions, safe handling, storage, and disposal.   Patient will take 2x 300 mg tablets (600 mg total) in the morning and 1x 300 mg tablet in the evening and increase to 2x 300 mg tablets (600 mg total) twice daily as tolerated.   Side effects include but not limited to: changes in liver function and decreased white blood cells.    Reviewed with patient importance of keeping a medication schedule and plan for any missed doses.  Phillip Clements voiced understanding and appreciation. All questions answered.  Provided patient with [facility] phone number. Patient knows to call the office with questions or concerns. Oral Chemotherapy [facility] will continue to follow.  Patient received medication and is prepared to start Nubeqa this morning.   Eddie Candle, PharmD PGY2 Hematology/Oncology Pharmacy Resident Oral Chemotherapy [facility] 06/14/2020 12:44 PM

## 2020-06-24 ENCOUNTER — Other Ambulatory Visit: Payer: Self-pay

## 2020-06-24 ENCOUNTER — Inpatient Hospital Stay (HOSPITAL_BASED_OUTPATIENT_CLINIC_OR_DEPARTMENT_OTHER): Payer: Medicare Other | Admitting: Internal Medicine

## 2020-06-24 ENCOUNTER — Other Ambulatory Visit: Payer: Self-pay | Admitting: Cardiovascular Disease

## 2020-06-24 ENCOUNTER — Inpatient Hospital Stay: Payer: Medicare Other | Attending: Internal Medicine

## 2020-06-24 DIAGNOSIS — C61 Malignant neoplasm of prostate: Secondary | ICD-10-CM

## 2020-06-24 DIAGNOSIS — I10 Essential (primary) hypertension: Secondary | ICD-10-CM

## 2020-06-24 DIAGNOSIS — E785 Hyperlipidemia, unspecified: Secondary | ICD-10-CM

## 2020-06-24 DIAGNOSIS — C7951 Secondary malignant neoplasm of bone: Secondary | ICD-10-CM | POA: Insufficient documentation

## 2020-06-24 DIAGNOSIS — I2510 Atherosclerotic heart disease of native coronary artery without angina pectoris: Secondary | ICD-10-CM | POA: Insufficient documentation

## 2020-06-24 DIAGNOSIS — R079 Chest pain, unspecified: Secondary | ICD-10-CM

## 2020-06-24 LAB — COMPREHENSIVE METABOLIC PANEL
ALT: 17 U/L (ref 0–44)
AST: 13 U/L — ABNORMAL LOW (ref 15–41)
Albumin: 3.9 g/dL (ref 3.5–5.0)
Alkaline Phosphatase: 64 U/L (ref 38–126)
Anion gap: 7 (ref 5–15)
BUN: 26 mg/dL — ABNORMAL HIGH (ref 8–23)
CO2: 26 mmol/L (ref 22–32)
Calcium: 8.8 mg/dL — ABNORMAL LOW (ref 8.9–10.3)
Chloride: 103 mmol/L (ref 98–111)
Creatinine, Ser: 0.91 mg/dL (ref 0.61–1.24)
GFR, Estimated: >60 mL/min (ref >60)
Glucose, Bld: 200 mg/dL — ABNORMAL HIGH (ref 70–99)
Potassium: 4.6 mmol/L (ref 3.5–5.1)
Sodium: 136 mmol/L (ref 135–145)
Total Bilirubin: 1.5 mg/dL — ABNORMAL HIGH (ref 0.3–1.2)
Total Protein: 6.7 g/dL (ref 6.5–8.1)

## 2020-06-24 LAB — CBC WITH DIFFERENTIAL/PLATELET
Abs Immature Granulocytes: 0.03 10*3/uL (ref 0.00–0.07)
Basophils Absolute: 0 10*3/uL (ref 0.0–0.1)
Basophils Relative: 1 %
Eosinophils Absolute: 0.1 10*3/uL (ref 0.0–0.5)
Eosinophils Relative: 2 %
HCT: 42.4 % (ref 39.0–52.0)
Hemoglobin: 14.9 g/dL (ref 13.0–17.0)
Immature Granulocytes: 1 %
Lymphocytes Relative: 22 %
Lymphs Abs: 1.2 10*3/uL (ref 0.7–4.0)
MCH: 29.9 pg (ref 26.0–34.0)
MCHC: 35.1 g/dL (ref 30.0–36.0)
MCV: 85 fL (ref 80.0–100.0)
Monocytes Absolute: 0.4 10*3/uL (ref 0.1–1.0)
Monocytes Relative: 7 %
Neutro Abs: 3.7 10*3/uL (ref 1.7–7.7)
Neutrophils Relative %: 67 %
Platelets: 174 10*3/uL (ref 150–400)
RBC: 4.99 MIL/uL (ref 4.22–5.81)
RDW: 12.3 % (ref 11.5–15.5)
WBC: 5.6 10*3/uL (ref 4.0–10.5)
nRBC: 0 % (ref 0.0–0.2)

## 2020-06-24 MED ORDER — LOSARTAN POTASSIUM 25 MG PO TABS: ORAL_TABLET | ORAL | 1 refills | Status: DC

## 2020-06-24 NOTE — Telephone Encounter (Signed)
Rx request sent to pharmacy.  

## 2020-06-24 NOTE — Progress Notes (Signed)
[facility] [place] CONSULT NOTE  Patient Care Team: Tonia Ghent, MD as PCP - General (Family Medicine) Wellington Hampshire, MD as PCP - Cardiology (Cardiology)  CHIEF COMPLAINTS/PURPOSE OF CONSULTATION: Prostate cancer  #  Oncology History Overview Note  1. Prostate adenocarcinoma  a. 02/2006 PSA returned elevated at 17.49  b. 04/03/2006; PSA 24.7; prostate biopsy Gleason 3+3 with 4/4 cores c. tx with HIFU overseas late 2007 in [place] [Dr.Cope]] d. persistent PSA elevation over period 2007-2010; nadir 2.1 09/16/06, peak 15.0 ng/mL  e. 04/2006 bone scan and CT abd/pelvis without evidence of metastasis f. 03/08/2007 CT abdomen/pelvis with no definitive metastatic disease noted  g. 03/18/2007 prostascint scan with no evidence of disease but small pelvic lymph nodes noted  h. 08/15/2009 prostascint scan with no evidence of local prostate cancer or distant metastases  i. 08/22/2009 PSA 15 ng/mL; 02/06/2010 PSA 20.5 ng/mL  j. 03/08/2010 PSA 20.9 ng/mL  k. 06/02/10 initiation of degarelix  l. 05/25/10 PET CT (Fluoride) revealed heterogenous areas of FDG-avidity in ribs, femurs, humeri, areas also in the metatarsals noted which may have been arthiritic in nature  m. 07/03/10 PSA 9.6 ng/mL  o.  Started Lupron/Casodex; June 2020-PSA from 0.2 to 0.3 -I;  As per patient-Casodex was withdrawn-but noted to have a jump of PSA to 0.9. FEB 2021-evidence of prostatic malignancy in the residual prostate bed; no distant metastatic disease   # FEB 2021-PET scan uptake in prostate; no bone mets.-EBRT to prostate [finished end of May 2021]; October 2021 PET scan improved prostate uptake; no evidence metastatic disease.  # EARLY NOV-apalutamide 3pills [pt pref]  #comorbidities-CAD s/p CABG; diabetes; PVD  # Prostate cancer-at age of 69 s/p-genetic counseling-NEG.   # NGS/MOLECULAR TESTS:NA  # PALLIATIVE CARE EVALUATION: NA  # PAIN MANAGEMENT: NA   DIAGNOSIS: Castrate resistant metastatic prostate  cancer  STAGE:    4     ;  GOALS: Palliative/control  CURRENT/MOST RECENT THERAPY : Darolutamide    Prostate cancer ([place])  09/16/2007 Initial Diagnosis   Prostate cancer ([place])   01/22/2020 Genetic Testing   Negative genetic testing:  No pathogenic variants detected on the [facility] Multi-Cancer panel. The report date is 01/22/2020.   The Common Hereditary Cancers Panel offered by [facility] includes sequencing and/or deletion duplication testing of the following 48 genes: APC, ATM, AXIN2, BARD1, BMPR1A, BRCA1, BRCA2, BRIP1, CDH1, CDK4, CDKN2A (p14ARF), CDKN2A (p16INK4a), CHEK2, CTNNA1, DICER1, EPCAM (Deletion/duplication testing only), GREM1 (promoter region deletion/duplication testing only), KIT, MEN1, MLH1, MSH2, MSH3, MSH6, MUTYH, NBN, NF1, NTHL1, PALB2, PDGFRA, PMS2, POLD1, POLE, PTEN, RAD50, RAD51C, RAD51D, RNF43, SDHB, SDHC, SDHD, SMAD4, SMARCA4. STK11, TP53, TSC1, TSC2, and VHL.  The following genes were evaluated for sequence changes only: SDHA and HOXB13 c.251G>A variant only.     HISTORY OF PRESENTING ILLNESS:  POWELL HALBERT 69 y.o.  male with history of castrate resistant prostate cancer with Hx of  metastatic to bone/Lupron-is here for follow-up.  Patient was started on darolutamide 3 pills a day.  Tolerating fairly well except for mild fatigue.  Overall seems to be improving.  Denies any joint pains back pain or shortness of breath or cough.   Review of Systems  Constitutional: Positive for malaise/fatigue. Negative for chills, diaphoresis, fever and weight loss.  HENT: Negative for nosebleeds and sore throat.   Eyes: Negative for double vision.  Respiratory: Negative for cough, hemoptysis, sputum production, shortness of breath and wheezing.   Cardiovascular: Negative for chest pain, palpitations, orthopnea and leg swelling.  Gastrointestinal: Negative for abdominal pain, blood in stool, constipation, diarrhea, heartburn, melena, nausea and vomiting.  Genitourinary: Positive  for frequency and urgency.  Musculoskeletal: Positive for back pain. Negative for joint pain.  Skin: Negative.  Negative for itching and rash.  Neurological: Negative for dizziness, tingling, focal weakness, weakness and headaches.  Endo/Heme/Allergies: Does not bruise/bleed easily.  Psychiatric/Behavioral: Negative for depression. The patient is not nervous/anxious and does not have insomnia.      MEDICAL HISTORY:  Past Medical History:  Diagnosis Date  . Cataract    bilateral eye in 2010  . Coronary artery disease    a. CABG 04/2014: (LIMA->LAD, VG->DIAG, VG->OM2, VG->PDA)  b. 07/2014 early graft failure (VG->OM2 60, LIMA->LAD atretic, VG->PDA & VG->D1 100). s/p successful rotational atherectomy & DES of the LM & pLAD (3.0x24 Promus DES) & mLAD (2.25x24 Promus DES).  . CVA (cerebral infarction)   . Family history of prostate cancer   . Hx of adenomatous colonic polyps 06/17/2017  . Hyperlipidemia    a. Statin intolerant.  . Hypertension 2000  . Moderate mitral regurgitation    a. 02/2015 Echo: EF 55-60%, no rwma, mod MR, mod BAE, mildly dil RV w/ low nl RV fxn.  . OSA on CPAP   . Peripheral vascular disease ([place])    a. left SFA angioplasty in December 2016  . Prostate cancer ([place])   . Sleep apnea    wears CPAP nightly  . Type II diabetes mellitus ([place])     SURGICAL HISTORY: Past Surgical History:  Procedure Laterality Date  . ANTERIOR CERVICAL DECOMP/DISCECTOMY FUSION  08/25/2012   Procedure: ANTERIOR CERVICAL DECOMPRESSION/DISCECTOMY FUSION 2 LEVELS;  Surgeon: Hosie Spangle, MD;  Location: [facility];  Service: Neurosurgery;  Laterality: N/A;  Cervical five-six Cervical six-seven anterior cervical decompression with fusion and plating and bonegraft  . BACK SURGERY    . CARDIAC CATHETERIZATION  04/2014; 07/19/2014  . CORONARY ARTERY BYPASS GRAFT N/A 04/26/2014   Procedure: CORONARY ARTERY BYPASS GRAFTING (CABG), on pump, times four, using left internal mammary artery, right  greater saphenous vein harvested endoscopically.;  Surgeon: Ivin Poot, MD;  Location: [facility];  Service: Open Heart Surgery;  Laterality: N/A;  LIMA to LAD, SVG to DIAGONAL, SVG to OM1, SVG to PDA) with EVH of the RIGHT THIGH and LOWER EXTREMITY SAPHENOUS VEIN  . Cytoscopy prostatic stone o/w nml  06/21/08   Dr. Jacqlyn Larsen  . ETT myoview  09/13/09   Low risk EF 49%  . High intense focused ultrasound  07/20/06   By Dr. Jacqlyn Larsen  . INTRAOPERATIVE TRANSESOPHAGEAL ECHOCARDIOGRAM N/A 04/26/2014   Procedure: INTRAOPERATIVE TRANSESOPHAGEAL ECHOCARDIOGRAM;  Surgeon: Ivin Poot, MD;  Location: [facility];  Service: Open Heart Surgery;  Laterality: N/A;  . PERCUTANEOUS CORONARY ROTOBLATOR INTERVENTION (PCI-R) N/A 07/22/2014   Procedure: PERCUTANEOUS CORONARY ROTOBLATOR INTERVENTION (PCI-R);  Surgeon: Peter M Martinique, MD;  Location: [facility];  Service: Cardiovascular;  Laterality: N/A;  . PERIPHERAL VASCULAR CATHETERIZATION Left 07/26/2015   Procedure: Lower Extremity Angiography;  Surgeon: Katha Cabal, MD;  Location: [facility];  Service: Cardiovascular;  Laterality: Left;  . PERIPHERAL VASCULAR CATHETERIZATION  07/26/2015   Procedure: Lower Extremity Intervention;  Surgeon: Katha Cabal, MD;  Location: [facility];  Service: Cardiovascular;;  . PROSTATE BIOPSY  04/03/06 & 02/25/07    SOCIAL HISTORY: Social History   Socioeconomic History  . Marital status: Married    Spouse name: Not on file  . Number of children:  3  . Years of education: Not on file  . Highest education level: Not on file  Occupational History  . Occupation: Insurance account manager  Tobacco Use  . Smoking status: Former Smoker    Packs/day: 1.00    Years: 36.00    Pack years: 36.00    Types: Cigarettes    Quit date: 03/01/2014    Years since quitting: 6.3  . Smokeless tobacco: Never Used  Vaping Use  . Vaping Use: Never used  Substance and Sexual Activity  . Alcohol use: Yes     Comment: 07/19/2014 "might have a drink a couple times/yr"  . Drug use: No  . Sexual activity: Not Currently  Other Topics Concern  . 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 In [place].    Social Determinants of Health   Financial Resource Strain:   . Difficulty of Paying Living Expenses: Not on file  Food Insecurity:   . Worried About Charity fundraiser in the Last Year: Not on file  . Ran Out of Food in the Last Year: Not on file  Transportation Needs:   . Lack of Transportation (Medical): Not on file  . Lack of Transportation (Non-Medical): Not on file  Physical Activity:   . Days of Exercise per Week: Not on file  . Minutes of Exercise per Session: Not on file  Stress:   . Feeling of Stress : Not on file  Social Connections:   . Frequency of Communication with Friends and Family: Not on file  . Frequency of Social Gatherings with Friends and Family: Not on file  . Attends Religious Services: Not on file  . Active Member of Clubs or Organizations: Not on file  . Attends Archivist Meetings: Not on file  . Marital Status: Not on file  Intimate Partner Violence:   . Fear of Current or Ex-Partner: Not on file  . Emotionally Abused: Not on file  . Physically Abused: Not on file  . Sexually Abused: Not on file    FAMILY HISTORY: Family History  Problem Relation Age of Onset  . Diabetes Mother 16       DM  . Coronary artery disease Mother   . Hypertension Mother   . Heart attack Mother        CABG with MI in 2005  . Heart disease Mother        CV  . Hypertension Father   . Heart attack Father        CABG with Mi around 1997  . Coronary artery disease Father   . Heart disease Father        CV  . Diabetes Father   . Heart attack Brother        MI/PTCA  .  Heart disease Brother        CV  . Diabetes Brother   . Heart attack Maternal Grandfather        MI  . Prostate cancer Paternal Grandfather 49       likely metastatic  . Diabetes Sister        DM  . Breast cancer Neg Hx        Breast/ovarian/uterine cancer  . Colon cancer Neg Hx   . Depression Neg Hx   . Alcohol abuse  Neg Hx        ETOH/drug abuse  . Stroke Neg Hx     ALLERGIES:  is allergic to coreg [carvedilol], metformin and related, protonix [pantoprazole sodium], rosuvastatin calcium, and statins.  MEDICATIONS:  Current Outpatient Medications  Medication Sig Dispense Refill  . aspirin EC 81 MG tablet Take 1 tablet (81 mg total) by mouth daily. 90 tablet 3  . carvedilol (COREG) 3.125 MG tablet TAKE 1 TABLET BY MOUTH TWICE A DAY WITH A MEAL 180 tablet 0  . clopidogrel (PLAVIX) 75 MG tablet TAKE 1 TABLET BY MOUTH ONCE DAILY 30 tablet 2  . darolutamide (NUBEQA) 300 MG tablet Take 2 tablets (61m) by mouth in AM and 1 tablet (3051m in PM. Take with food 90 tablet 0  . Dulaglutide (TRULICITY) 3 MGYK/9.9IPOPN Inject 3 mg into the skin once a week. 12 pen 3  . ezetimibe (ZETIA) 10 MG tablet Take 1 tablet (10 mg total) by mouth daily. 30 tablet 1  . glucose blood (ONETOUCH ULTRA) test strip CHECK BLOOD SUGAR TWICE A DAY - DX E11.59 200 strip 3  . Injection Device (COWacissaMISC Frequency:PHARMDIR   Dosage:0.0     Instructions:  Note:diagnosis 250.02 Dose: NA    . insulin degludec (TRESIBA FLEXTOUCH) 200 UNIT/ML FlexTouch Pen Inject 44 Units into the skin daily. 9 pen 3  . Insulin Pen Needle 32G X 4 MM MISC Use 1x a day 100 each 3  . INVOKANA 300 MG TABS tablet TAKE 1 TABLET BY MOUTH ONCE A DAY WITH BREAKFAST 90 tablet 3  . leuprolide (LUPRON) 30 MG injection Inject 45 mg into the muscle every 6 (six) months.    . Marland Kitchenosartan (COZAAR) 25 MG tablet TAKE 1/2 TABLET BY MOUTH ONCE A DAY 45 tablet 1  . meclizine (ANTIVERT) 25 MG tablet Take 0.5-1 tablets (12.5-25 mg total)  by mouth 2 (two) times daily as needed for dizziness or nausea. 30 tablet 0  . mirabegron ER (MYRBETRIQ) 50 MG TB24 tablet Take 1 tablet (50 mg total) by mouth daily. 30 tablet 12  . nitroGLYCERIN (NITROSTAT) 0.4 MG SL tablet Place 1 tablet (0.4 mg total) under the tongue as needed. 25 tablet 2  . REPATHA SURECLICK 14382G/ML SOAJ INJECT ONE PEN INTO THE SKIN EVERY 14 DAYS 2 mL 3  . tamsulosin (FLOMAX) 0.4 MG CAPS capsule TAKE 1 CAPSULE BY MOUTH ONCE DAILY AFTERSUPPER 30 capsule 11   No current facility-administered medications for this visit.      . Marland KitchenPHYSICAL EXAMINATION: ECOG PERFORMANCE STATUS: 0 - Asymptomatic  Vitals:   06/24/20 1042  BP: (!) 113/58  Pulse: 71  Resp: 16  Temp: 97.6 F (36.4 C)  SpO2: 99%   Filed Weights   06/24/20 1042  Weight: 246 lb (111.6 kg)    Physical Exam HENT:     Head: Normocephalic and atraumatic.     Mouth/Throat:     Pharynx: No oropharyngeal exudate.  Eyes:     Pupils: Pupils are equal, round, and reactive to light.  Cardiovascular:     Rate and Rhythm: Normal rate and regular rhythm.  Pulmonary:     Effort: Pulmonary effort is normal. No respiratory distress.     Breath sounds: Normal breath sounds. No wheezing.  Abdominal:     General: Bowel sounds are normal. There is no distension.     Palpations: Abdomen is soft. There is no mass.     Tenderness: There is no abdominal tenderness. There  is no guarding or rebound.  Musculoskeletal:        General: No tenderness. Normal range of motion.     Cervical back: Normal range of motion and neck supple.  Skin:    General: Skin is warm.  Neurological:     Mental Status: He is alert and oriented to person, place, and time.  Psychiatric:        Mood and Affect: Affect normal.     LABORATORY DATA:  I have reviewed the data as listed Lab Results  Component Value Date   WBC 5.6 06/24/2020   HGB 14.9 06/24/2020   HCT 42.4 06/24/2020   MCV 85.0 06/24/2020   PLT 174 06/24/2020    Recent Labs    10/09/19 0952 03/17/20 1012 06/24/20 1035  NA 139 135 136  K 4.2 4.0 4.6  CL 103 102 103  CO2 28 24 26   GLUCOSE 201* 157* 200*  BUN 23 17 26*  CREATININE 0.91 0.69 0.91  CALCIUM 8.7* 8.6* 8.8*  GFRNONAA >60 >60 >60  GFRAA >60 >60  --   PROT 6.8 6.5 6.7  ALBUMIN 4.0 3.9 3.9  AST 12* 14* 13*  ALT 20 18 17   ALKPHOS 63 65 64  BILITOT 0.9 0.7 1.5*    RADIOGRAPHIC STUDIES: I have personally reviewed the radiological images as listed and agreed with the findings in the report. No results found.  ASSESSMENT & PLAN:   Prostate cancer ([place]) # STAGE-IV-Prostate cancer-metastatic to bone/castrate resistant. S/P- EBRT to prostate [last Tx- on 01/08/2020]. PET Oct 10th, 2021-Mild asymmetric activity within the prostate gland decreased from comparison from Feb 2021; No convincing evidence of nodal metastasis. No visceral metastasis or skeletal metastasis.  October 2021 PSA 0.16/improving.  #Patient currently on on Darolutamide 300 mg- 2 in AM; 1 in PM [pt pref;]; tolerating fairly well except for mild fatigue.  Continue Eligard [every 6 months; 11/22; Dr. Bernardo Heater; labs CBC CMP-within normal limits.  #Castrate resistant prostate cancer/history of bone mets--discussed the role of Zometa to decrease skeletal related events Discussed the potential side effects including but not limited to-infusion reaction; hypocalcemia; and Osteo-necrosis of jaw. Recommend ca+Vit D BID. Recommend dental clearance.   # DISPOSITION:  #  Follow up in 2 months; MD- labs- cbc/cmp/PSA; possible zometa- Dr.B    All questions were answered. The patient knows to call the clinic with any problems, questions or concerns.    Cammie Sickle, MD 06/24/2020 1:12 PM

## 2020-06-24 NOTE — Assessment & Plan Note (Addendum)
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 S/P- EBRT to prostate [last Tx- on 01/08/2020]. PET Oct 10th, 2021-Mild asymmetric activity within the prostate gland decreased from comparison from Feb 2021; No convincing evidence of nodal metastasis. No visceral metastasis or skeletal metastasis.  October 2021 PSA 0.16/improving.  #Patient currently on on Darolutamide 300 mg- 2 in AM; 1 in PM [pt pref;]; tolerating fairly well except for mild fatigue.  Continue Eligard [every 6 months; 11/22; Dr. Bernardo Heater; labs CBC CMP-within normal limits.  #Castrate resistant prostate cancer/history of bone mets--discussed the role of Zometa to decrease skeletal related events Discussed the potential side effects including but not limited to-infusion reaction; hypocalcemia; and Osteo-necrosis of jaw. Recommend ca+Vit D BID. Recommend dental clearance.   # DISPOSITION:  #  Follow up in 2 months; MD- labs- cbc/cmp/PSA; possible zometa- Dr.B

## 2020-06-24 NOTE — Patient Instructions (Signed)
#   dental clearance prior to starting ZOMETA/zolendronic acid.

## 2020-06-25 LAB — PROSTATE-SPECIFIC AG, SERUM (LABCORP): Prostate Specific Ag, Serum: <0.1 ng/mL (ref 0.0–4.0)

## 2020-07-01 ENCOUNTER — Other Ambulatory Visit: Payer: Self-pay

## 2020-07-01 ENCOUNTER — Ambulatory Visit (INDEPENDENT_AMBULATORY_CARE_PROVIDER_SITE_OTHER): Payer: Medicare Other | Admitting: Internal Medicine

## 2020-07-01 ENCOUNTER — Encounter: Payer: Self-pay | Admitting: Internal Medicine

## 2020-07-01 VITALS — BP 128/78 | HR 75 | Ht 69.0 in | Wt 246.2 lb

## 2020-07-01 DIAGNOSIS — Z6839 Body mass index (BMI) 39.0-39.9, adult: Secondary | ICD-10-CM | POA: Diagnosis not present

## 2020-07-01 DIAGNOSIS — E1159 Type 2 diabetes mellitus with other circulatory complications: Secondary | ICD-10-CM | POA: Diagnosis not present

## 2020-07-01 DIAGNOSIS — E1165 Type 2 diabetes mellitus with hyperglycemia: Secondary | ICD-10-CM

## 2020-07-01 DIAGNOSIS — E785 Hyperlipidemia, unspecified: Secondary | ICD-10-CM | POA: Diagnosis not present

## 2020-07-01 DIAGNOSIS — I2510 Atherosclerotic heart disease of native coronary artery without angina pectoris: Secondary | ICD-10-CM

## 2020-07-01 LAB — POCT GLYCOSYLATED HEMOGLOBIN (HGB A1C): Hemoglobin A1C: 8.2 % — AB (ref 4.0–5.6)

## 2020-07-01 MED ORDER — NOVOLOG FLEXPEN 100 UNIT/ML ~~LOC~~ SOPN: 10.0000 [IU] | PEN_INJECTOR | Freq: Every day | SUBCUTANEOUS | 11 refills | Status: DC

## 2020-07-01 NOTE — Patient Instructions (Addendum)
Please continue: - Invokana  300 mg bfore b'fast - Trulicity 3 mg weekly. - Tresiba 66 units daily   Please add: - NovoLog 10-16 units 15 min before dinner  Please return in 4 months with your sugar log.

## 2020-07-01 NOTE — Addendum Note (Signed)
Addended by: Lauralyn Primes on: 07/01/2020 11:05 AM   Modules accepted: Orders

## 2020-07-01 NOTE — Progress Notes (Signed)
Patient ID: Phillip Clements, male   DOB: 1950-12-11, 69 y.o.   MRN: [id number]  This visit occurred during the SARS-CoV-2 public health emergency.  Safety protocols were in place, including screening questions prior to the visit, additional usage of staff PPE, and extensive cleaning of exam room while observing appropriate contact time as indicated for disinfecting solutions.   HPI: Phillip Clements is a 69 y.o.-year-old male, returning for f/u for DM2, dx in 1996, insulin-dependent since 2008, uncontrolled, with complications (CAD - s/p stents, CABG, cerebro-vascular ds - s/p CVA, PVD). Last visit 4 months ago. He changed to [facility] in 10/2016.  Reviewed HbA1c levels: Lab Results  Component Value Date   HGBA1C 8.2 (A) 02/25/2020   HGBA1C 7.8 (A) 06/26/2019   HGBA1C 7.2 (A) 02/19/2019   Pt was on a regimen of: - Invokana 300 mg before b'fast - Trulicity 1.5 mg weekly - Novolog 40 units 3x a day - 15 min before meals - Toujeo 120  units (60 x2) after dinner He tried Victoza and Januvia. Had GI intolerance (nausea, diarrhea) to regular Metformin and also with metformin ER >>stopped.  He was then on: - Invokana 300 mg before breakfast - Trulicity 1.5 mg weekly - U500 insulin 55-70 units 3 times a day before meals Also, if sugars in the morning are: - 160-200, take 10 units - 201-240, take 15 units - >240, take 20 units  He is currently on: - Invokana 300 mg bfore b'fast - Trulicity 1.5 >> 3 mg weekly. - Tresiba 32 >> 44 units daily-started 10/2018 >>  was off 1 months prior to our last visit >> restarted at 26 >> 66 units daily  He is checking sugars once a day: - am: 126-170, 209, 214 >> 119, 150-200 >> n/c >> 115-195, 226 - 2h after b'fast: n/c - not eating b'fast  - before lunch: 100-120 >> n/c >> 126-166 >> n/c - 2h after lunch: n/c >> 170 - before dinner: 100-120 >> 160s >> 150 >> n/c >> 102 - 2h after dinner: n/c >> 280 >> 224-269 - bedtime: 201, 214 >> 128-150 >> n/c -  nighttime: n/c Lowest sugar was 56 (on insulin) >> ...>> 119 >>  102 he has hypoglycemia awareness in the 70s. Highest sugar was  445 >> ... >> 237 >> 280 >> 300 x 1.  Glucometer: OneTouch Ultra mini  Pt's meals are mostly plant-based: - Breakfast:  skips - Lunch: sandwich, chips, cottage cheese, pineapple - Dinner: 2 veggies, salads; Sat night: meat - Snacks: 2/day: celery + PB; low salt triscuit + laughing cow cheese  He continues to walk on the treadmill 2 out of 7 days and working outside.  + Mild CKD, last BUN/creatinine:  Lab Results  Component Value Date   BUN 26 (H) 06/24/2020   CREATININE 0.91 06/24/2020  On losartan. -+ HL: Lab Results  Component Value Date   CHOL 166 10/09/2019   HDL 42 10/09/2019   LDLCALC 96 10/09/2019   LDLDIRECT 157.1 09/07/2013   TRIG 138 10/09/2019   CHOLHDL 4.0 10/09/2019  He could not tolerate statins due to joint pains.  He continues on Repatha and Zetia. - last eye exam was in 05/2020: No DR reportedly; + history of cataract surgery. Phillip Clements. - no Numbness and tingling in his feet.  He has prostate cancer, believed to be metastatic to the bones.  On Lupron.  He finished radiation therapy.  ROS: Constitutional: no weight gain/no weight loss, + fatigue  with exertion, no subjective hyperthermia, no subjective hypothermia Eyes: no blurry vision, no xerophthalmia ENT: no sore throat, + see HPI Cardiovascular: no CP/no SOB/no palpitations/no leg swelling Respiratory: no cough/no SOB/no wheezing Gastrointestinal: no N/no V/no D/no C/no acid reflux Musculoskeletal: no muscle aches/no joint aches Skin: no rashes, no hair loss Neurological: no tremors/no numbness/no tingling/no dizziness  I reviewed pt's medications, allergies, PMH, social hx, family hx, and changes were documented in the history of present illness. Otherwise, unchanged from my initial visit note.  Past Medical History:  Diagnosis Date  . Cataract    bilateral  eye in 2010  . Coronary artery disease    a. CABG 04/2014: (LIMA->LAD, VG->DIAG, VG->OM2, VG->PDA)  b. 07/2014 early graft failure (VG->OM2 60, LIMA->LAD atretic, VG->PDA & VG->D1 100). s/p successful rotational atherectomy & DES of the LM & pLAD (3.0x24 Promus DES) & mLAD (2.25x24 Promus DES).  . CVA (cerebral infarction)   . Family history of prostate cancer   . Hx of adenomatous colonic polyps 06/17/2017  . Hyperlipidemia    a. Statin intolerant.  . Hypertension 2000  . Moderate mitral regurgitation    a. 02/2015 Echo: EF 55-60%, no rwma, mod MR, mod BAE, mildly dil RV w/ low nl RV fxn.  . OSA on CPAP   . Peripheral vascular disease ([place])    a. left SFA angioplasty in December 2016  . Prostate cancer ([place])   . Sleep apnea    wears CPAP nightly  . Type II diabetes mellitus ([place])    Past Surgical History:  Procedure Laterality Date  . ANTERIOR CERVICAL DECOMP/DISCECTOMY FUSION  08/25/2012   Procedure: ANTERIOR CERVICAL DECOMPRESSION/DISCECTOMY FUSION 2 LEVELS;  Surgeon: Hosie Spangle, MD;  Location: [place] [facility];  Service: Neurosurgery;  Laterality: N/A;  Cervical five-six Cervical six-seven anterior cervical decompression with fusion and plating and bonegraft  . BACK SURGERY    . CARDIAC CATHETERIZATION  04/2014; 07/19/2014  . CORONARY ARTERY BYPASS GRAFT N/A 04/26/2014   Procedure: CORONARY ARTERY BYPASS GRAFTING (CABG), on pump, times four, using left internal mammary artery, right greater saphenous vein harvested endoscopically.;  Surgeon: Ivin Poot, MD;  Location: [place];  Service: Open Heart Surgery;  Laterality: N/A;  LIMA to LAD, SVG to DIAGONAL, SVG to OM1, SVG to PDA) with EVH of the RIGHT THIGH and LOWER EXTREMITY SAPHENOUS VEIN  . Cytoscopy prostatic stone o/w nml  06/21/08   Dr. Jacqlyn Larsen  . ETT myoview  09/13/09   Low risk EF 49%  . High intense focused ultrasound  07/20/06   By Dr. Jacqlyn Larsen  . INTRAOPERATIVE TRANSESOPHAGEAL ECHOCARDIOGRAM N/A 04/26/2014   Procedure:  INTRAOPERATIVE TRANSESOPHAGEAL ECHOCARDIOGRAM;  Surgeon: Ivin Poot, MD;  Location: [place];  Service: Open Heart Surgery;  Laterality: N/A;  . PERCUTANEOUS CORONARY ROTOBLATOR INTERVENTION (PCI-R) N/A 07/22/2014   Procedure: PERCUTANEOUS CORONARY ROTOBLATOR INTERVENTION (PCI-R);  Surgeon: Peter M Martinique, MD;  Location: [facility] CATH LAB;  Service: Cardiovascular;  Laterality: N/A;  . PERIPHERAL VASCULAR CATHETERIZATION Left 07/26/2015   Procedure: Lower Extremity Angiography;  Surgeon: Katha Cabal, MD;  Location: [place] CV LAB;  Service: Cardiovascular;  Laterality: Left;  . PERIPHERAL VASCULAR CATHETERIZATION  07/26/2015   Procedure: Lower Extremity Intervention;  Surgeon: Katha Cabal, MD;  Location: [place] CV LAB;  Service: Cardiovascular;;  . PROSTATE BIOPSY  04/03/06 & 02/25/07   Social History   Socioeconomic History  . Marital status: Married    Spouse name: Not on file  . Number of  children: 3  . Years of education: Not on file  . Highest education level: Not on file  Occupational History  . Occupation: Insurance account manager  Tobacco Use  . Smoking status: Former Smoker    Packs/day: 1.00    Years: 36.00    Pack years: 36.00    Types: Cigarettes    Quit date: 03/01/2014    Years since quitting: 6.3  . Smokeless tobacco: Never Used  Vaping Use  . Vaping Use: Never used  Substance and Sexual Activity  . Alcohol use: Yes    Comment: 07/19/2014 "might have a drink a couple times/yr"  . Drug use: No  . Sexual activity: Not Currently  Other Topics Concern  . 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 In [place].    Social Determinants of Health   Financial Resource Strain:   . Difficulty of Paying Living Expenses: Not on file  Food  Insecurity:   . Worried About Charity fundraiser in the Last Year: Not on file  . Ran Out of Food in the Last Year: Not on file  Transportation Needs:   . Lack of Transportation (Medical): Not on file  . Lack of Transportation (Non-Medical): Not on file  Physical Activity:   . Days of Exercise per Week: Not on file  . Minutes of Exercise per Session: Not on file  Stress:   . Feeling of Stress : Not on file  Social Connections:   . Frequency of Communication with Friends and Family: Not on file  . Frequency of Social Gatherings with Friends and Family: Not on file  . Attends Religious Services: Not on file  . Active Member of Clubs or Organizations: Not on file  . Attends Archivist Meetings: Not on file  . Marital Status: Not on file  Intimate Partner Violence:   . Fear of Current or Ex-Partner: Not on file  . Emotionally Abused: Not on file  . Physically Abused: Not on file  . Sexually Abused: Not on file   Current Outpatient Medications on File Prior to Visit  Medication Sig Dispense Refill  . aspirin EC 81 MG tablet Take 1 tablet (81 mg total) by mouth daily. 90 tablet 3  . carvedilol (COREG) 3.125 MG tablet TAKE 1 TABLET BY MOUTH TWICE A DAY WITH A MEAL 180 tablet 0  . clopidogrel (PLAVIX) 75 MG tablet TAKE 1 TABLET BY MOUTH ONCE DAILY 30 tablet 2  . darolutamide (NUBEQA) 300 MG tablet Take 2 tablets (600mg ) by mouth in AM and 1 tablet (300mg ) in PM. Take with food 90 tablet 0  . Dulaglutide (TRULICITY) 3 RD/4.0CX SOPN Inject 3 mg into the skin once a week. 12 pen 3  . ezetimibe (ZETIA) 10 MG tablet Take 1 tablet (10 mg total) by mouth daily. 30 tablet 1  . glucose blood (ONETOUCH ULTRA) test strip CHECK BLOOD SUGAR TWICE A DAY - DX E11.59 200 strip 3  . Injection Device ([place]) MISC Frequency:PHARMDIR   Dosage:0.0     Instructions:  Note:diagnosis 250.02 Dose: NA    . insulin degludec (TRESIBA FLEXTOUCH) 200 UNIT/ML FlexTouch Pen Inject 44 Units  into the skin daily. 9 pen 3  . Insulin Pen Needle 32G X 4 MM MISC Use 1x a day 100 each  3  . INVOKANA 300 MG TABS tablet TAKE 1 TABLET BY MOUTH ONCE A DAY WITH BREAKFAST 90 tablet 3  . leuprolide (LUPRON) 30 MG injection Inject 45 mg into the muscle every 6 (six) months.    Marland Kitchen losartan (COZAAR) 25 MG tablet TAKE 1/2 TABLET BY MOUTH ONCE A DAY 45 tablet 1  . meclizine (ANTIVERT) 25 MG tablet Take 0.5-1 tablets (12.5-25 mg total) by mouth 2 (two) times daily as needed for dizziness or nausea. 30 tablet 0  . mirabegron ER (MYRBETRIQ) 50 MG TB24 tablet Take 1 tablet (50 mg total) by mouth daily. 30 tablet 12  . nitroGLYCERIN (NITROSTAT) 0.4 MG SL tablet Place 1 tablet (0.4 mg total) under the tongue as needed. 25 tablet 2  . REPATHA SURECLICK 935 MG/ML SOAJ INJECT ONE PEN INTO THE SKIN EVERY 14 DAYS 2 mL 3  . tamsulosin (FLOMAX) 0.4 MG CAPS capsule TAKE 1 CAPSULE BY MOUTH ONCE DAILY AFTERSUPPER 30 capsule 11   No current facility-administered medications on file prior to visit.   Allergies  Allergen Reactions  . Coreg [Carvedilol] Other (See Comments)    Fatigue- unclear if this was due to coreg specifically or beta blockers in general.    . Metformin And Related Nausea Only  . Protonix [Pantoprazole Sodium] Diarrhea  . Rosuvastatin Calcium     REACTION: JOINT ACHES  . Statins     REACTION: JOINT ACHES   Family History  Problem Relation Age of Onset  . Diabetes Mother 64       DM  . Coronary artery disease Mother   . Hypertension Mother   . Heart attack Mother        CABG with MI in 2005  . Heart disease Mother        CV  . Hypertension Father   . Heart attack Father        CABG with Mi around 1997  . Coronary artery disease Father   . Heart disease Father        CV  . Diabetes Father   . Heart attack Brother        MI/PTCA  . Heart disease Brother        CV  . Diabetes Brother   . Heart attack Maternal Grandfather        MI  . Prostate cancer Paternal Grandfather 48        likely metastatic  . Diabetes Sister        DM  . Breast cancer Neg Hx        Breast/ovarian/uterine cancer  . Colon cancer Neg Hx   . Depression Neg Hx   . Alcohol abuse Neg Hx        ETOH/drug abuse  . Stroke Neg Hx    PE: BP 128/78   Pulse 75   Ht 5\' 9"  (1.753 m)   Wt 246 lb 3.2 oz (111.7 kg)   SpO2 96%   BMI 36.36 kg/m  Body mass index is 36.36 kg/m. Wt Readings from Last 3 Encounters:  07/01/20 246 lb 3.2 oz (111.7 kg)  06/24/20 246 lb (111.6 kg)  06/03/20 241 lb 8 oz (109.5 kg)   Constitutional: overweight, in NAD Eyes: PERRLA, EOMI, no exophthalmos ENT: moist mucous membranes, no thyromegaly, no cervical lymphadenopathy Cardiovascular: RRR, No MRG Respiratory: CTA B Gastrointestinal: abdomen soft, NT, ND, BS+ Musculoskeletal: no deformities, strength intact in all 4 Skin: moist, warm, no rashes Neurological: no tremor with outstretched hands, DTR normal  in all 4  ASSESSMENT: 1. DM2, insulin-dependent, uncontrolled, with complications - CAD - s/p stents, CABG - cerebro-vascular ds - s/p CVA  - PVD - s/p L stent  2. HL  3.  Obesity class II BMI Classification:  < 18.5 underweight   18.5-24.9 normal weight   25.0-29.9 overweight   30.0-34.9 class I obesity   35.0-39.9 class II obesity   ? 40.0 class III obesity   PLAN:  1. Patient with longstanding, insulin resistance diabetes, previously on U500 insulin, but off insulin after sugars improved on Invokana and Trulicity.  At last visit, she relaxed his diet and was not checking blood sugars.  He was also off [place] for the months prior to the visit as he could not refill it.  We restarted 26 units of Tresiba but I advised him to increase the dose based on blood sugars.  We continued Invokana and Trulicity.  I strongly advised him to restart checking blood sugars.  An HbA1c was higher, at 8.2%. -At today's visit, sugars are still very high, especially after dinner, when they increase well above 200s.  In  the morning, sugars are occasionally in the 140s, but the majority are higher.  He is not usually checking sugars in the middle of the day.  He had 1 blood sugar check before lunch and this was at goal, at 102.  I believe that his high blood sugars in the morning are due to incomplete coverage of his dinner.  For now, I recommended to start NovoLog before this meal.  Will add a low dose and increase as needed.  She increased his Tresiba dose since last visit and will continue 66 units for now.  We will continue Invokana and Trulicity. - I suggested to: Patient Instructions  Please continue: - Invokana  300 mg bfore b'fast - Trulicity 3 mg weekly. - Tresiba 66 units daily   Please add: - NovoLog 10-16 units 15 min before dinner  Please return in 4 months with your sugar log.   - we checked his HbA1c: 8.2% (stable) - advised to check sugars at different times of the day - 1-2x a day, rotating check times - advised for yearly eye exams >> he is UTD - return to clinic in 4 months  2. HL -Reviewed latest lipid panel from 09/2019: LDL above our target of less than 70 due to CAD, the rest of the fractions were at goal: Lab Results  Component Value Date   CHOL 166 10/09/2019   HDL 42 10/09/2019   LDLCALC 96 10/09/2019   LDLDIRECT 157.1 09/07/2013   TRIG 138 10/09/2019   CHOLHDL 4.0 10/09/2019  -He is intolerant to statins.  He continues Repatha and ezetimibe 10 mg daily without side effects  3. Obesity -also contributed by the Lupron injections -In the past, he lost a significant amount of weight (35 pounds) on keto diet.  Afterwards, weight fluctuated by 8 pounds, however, since last visit, he gained 13 pounds. -continue SGLT 2 inhibitor and GLP-1 receptor agonist which should also help with weight loss  Philemon Kingdom, MD PhD [facility]

## 2020-07-04 ENCOUNTER — Ambulatory Visit (INDEPENDENT_AMBULATORY_CARE_PROVIDER_SITE_OTHER): Payer: Medicare Other

## 2020-07-04 ENCOUNTER — Other Ambulatory Visit: Payer: Self-pay

## 2020-07-04 DIAGNOSIS — C61 Malignant neoplasm of prostate: Secondary | ICD-10-CM

## 2020-07-04 MED ORDER — LEUPROLIDE ACETATE (6 MONTH) 45 MG ~~LOC~~ KIT
45.0000 mg | PACK | Freq: Once | SUBCUTANEOUS | Status: AC
  Administered 2020-07-04: 45 mg via SUBCUTANEOUS

## 2020-07-04 NOTE — Progress Notes (Signed)
Eligard SubQ Injection   Due to Prostate Cancer patient is present today for a Eligard Injection.  Medication: Eligard 6 month Dose: 45 mg  Location: right upper outer buttocks   Lot: [id number] Exp: 10/2021  Patient tolerated well, no complications were noted.  Performed by: Gordy Clement, CMA (AAMA)  Patient's next follow up was scheduled for May 23,2022. This appointment was scheduled using wheel and given to patient today along with reminder continue on Vitamin D 800-1000iu and Calium 1000-1200mg  daily while on Androgen Deprivation Therapy.  PA approval dates: No PA Required

## 2020-07-04 NOTE — Patient Instructions (Signed)

## 2020-07-15 ENCOUNTER — Other Ambulatory Visit: Payer: Self-pay | Admitting: Internal Medicine

## 2020-07-16 ENCOUNTER — Other Ambulatory Visit: Payer: Self-pay | Admitting: Internal Medicine

## 2020-07-20 DIAGNOSIS — Z20822 Contact with and (suspected) exposure to covid-19: Secondary | ICD-10-CM | POA: Diagnosis not present

## 2020-07-28 ENCOUNTER — Other Ambulatory Visit: Payer: Self-pay

## 2020-07-28 ENCOUNTER — Encounter: Payer: Self-pay | Admitting: Physician Assistant

## 2020-07-28 ENCOUNTER — Ambulatory Visit (INDEPENDENT_AMBULATORY_CARE_PROVIDER_SITE_OTHER): Payer: Medicare Other | Admitting: Physician Assistant

## 2020-07-28 VITALS — BP 130/70 | HR 74 | Ht 69.0 in | Wt 251.0 lb

## 2020-07-28 DIAGNOSIS — E119 Type 2 diabetes mellitus without complications: Secondary | ICD-10-CM | POA: Diagnosis not present

## 2020-07-28 DIAGNOSIS — E785 Hyperlipidemia, unspecified: Secondary | ICD-10-CM

## 2020-07-28 DIAGNOSIS — I2510 Atherosclerotic heart disease of native coronary artery without angina pectoris: Secondary | ICD-10-CM | POA: Diagnosis not present

## 2020-07-28 DIAGNOSIS — R0600 Dyspnea, unspecified: Secondary | ICD-10-CM | POA: Diagnosis not present

## 2020-07-28 DIAGNOSIS — I5033 Acute on chronic diastolic (congestive) heart failure: Secondary | ICD-10-CM | POA: Diagnosis not present

## 2020-07-28 DIAGNOSIS — Z789 Other specified health status: Secondary | ICD-10-CM | POA: Diagnosis not present

## 2020-07-28 DIAGNOSIS — G4733 Obstructive sleep apnea (adult) (pediatric): Secondary | ICD-10-CM | POA: Diagnosis not present

## 2020-07-28 DIAGNOSIS — I739 Peripheral vascular disease, unspecified: Secondary | ICD-10-CM | POA: Diagnosis not present

## 2020-07-28 DIAGNOSIS — Z794 Long term (current) use of insulin: Secondary | ICD-10-CM | POA: Diagnosis not present

## 2020-07-28 DIAGNOSIS — I10 Essential (primary) hypertension: Secondary | ICD-10-CM

## 2020-07-28 DIAGNOSIS — Z9989 Dependence on other enabling machines and devices: Secondary | ICD-10-CM | POA: Diagnosis not present

## 2020-07-28 MED ORDER — FUROSEMIDE 20 MG PO TABS: 20.0000 mg | ORAL_TABLET | Freq: Every day | ORAL | 1 refills | Status: DC

## 2020-07-28 NOTE — Progress Notes (Signed)
Office Visit    Patient Name: Phillip Clements Date of Encounter: 07/28/2020  Primary Care Provider:  Tonia Ghent, MD Primary Cardiologist:  [name], MD  Chief Complaint    Chief Complaint  Patient presents with  . Follow-up    Patient c/o DOE; Meds verbally reviewed with patient.    69 year old male with history of CAD s/p CABG, PAD s/p left FSA angioplasty, hypertension, hyperlipidemia, CVA, DM2, obesity, stroke, MR, OSA, prostate cancer, and who presents today for early 6 month follow-up 2/2 progressive DOE.  Past Medical History    Past Medical History:  Diagnosis Date  . Cataract    bilateral eye in 2010  . Coronary artery disease    a. CABG 04/2014: (LIMA->LAD, VG->DIAG, VG->OM2, VG->PDA)  b. 07/2014 early graft failure (VG->OM2 60, LIMA->LAD atretic, VG->PDA & VG->D1 100). s/p successful rotational atherectomy & DES of the LM & pLAD (3.0x24 Promus DES) & mLAD (2.25x24 Promus DES).  . CVA (cerebral infarction)   . Family history of prostate cancer   . Hx of adenomatous colonic polyps 06/17/2017  . Hyperlipidemia    a. Statin intolerant.  . Hypertension 2000  . Moderate mitral regurgitation    a. 02/2015 Echo: EF 55-60%, no rwma, mod MR, mod BAE, mildly dil RV w/ low nl RV fxn.  . OSA on CPAP   . Peripheral vascular disease ([place])    a. left SFA angioplasty in December 2016  . Prostate cancer ([place])   . Sleep apnea    wears CPAP nightly  . Type II diabetes mellitus ([place])    Past Surgical History:  Procedure Laterality Date  . ANTERIOR CERVICAL DECOMP/DISCECTOMY FUSION  08/25/2012   Procedure: ANTERIOR CERVICAL DECOMPRESSION/DISCECTOMY FUSION 2 LEVELS;  Surgeon: Hosie Spangle, MD;  Location: [facility];  Service: Neurosurgery;  Laterality: N/A;  Cervical five-six Cervical six-seven anterior cervical decompression with fusion and plating and bonegraft  . BACK SURGERY    . CARDIAC CATHETERIZATION  04/2014; 07/19/2014  . CORONARY ARTERY BYPASS GRAFT N/A  04/26/2014   Procedure: CORONARY ARTERY BYPASS GRAFTING (CABG), on pump, times four, using left internal mammary artery, right greater saphenous vein harvested endoscopically.;  Surgeon: Ivin Poot, MD;  Location: [facility];  Service: Open Heart Surgery;  Laterality: N/A;  LIMA to LAD, SVG to DIAGONAL, SVG to OM1, SVG to PDA) with EVH of the RIGHT THIGH and LOWER EXTREMITY SAPHENOUS VEIN  . Cytoscopy prostatic stone o/w nml  06/21/08   Dr. Jacqlyn Larsen  . ETT myoview  09/13/09   Low risk EF 49%  . High intense focused ultrasound  07/20/06   By Dr. Jacqlyn Larsen  . INTRAOPERATIVE TRANSESOPHAGEAL ECHOCARDIOGRAM N/A 04/26/2014   Procedure: INTRAOPERATIVE TRANSESOPHAGEAL ECHOCARDIOGRAM;  Surgeon: Ivin Poot, MD;  Location: [facility];  Service: Open Heart Surgery;  Laterality: N/A;  . PERCUTANEOUS CORONARY ROTOBLATOR INTERVENTION (PCI-R) N/A 07/22/2014   Procedure: PERCUTANEOUS CORONARY ROTOBLATOR INTERVENTION (PCI-R);  Surgeon: Peter M Martinique, MD;  Location: [facility];  Service: Cardiovascular;  Laterality: N/A;  . PERIPHERAL VASCULAR CATHETERIZATION Left 07/26/2015   Procedure: Lower Extremity Angiography;  Surgeon: Katha Cabal, MD;  Location: [facility] CV LAB;  Service: Cardiovascular;  Laterality: Left;  . PERIPHERAL VASCULAR CATHETERIZATION  07/26/2015   Procedure: Lower Extremity Intervention;  Surgeon: Katha Cabal, MD;  Location: [facility];  Service: Cardiovascular;;  . PROSTATE BIOPSY  04/03/06 & 02/25/07    Allergies  Allergies  Allergen Reactions  . Coreg [  Carvedilol] Other (See Comments)    Fatigue- unclear if this was due to coreg specifically or beta blockers in general.    . Metformin And Related Nausea Only  . Protonix [Pantoprazole Sodium] Diarrhea  . Rosuvastatin Calcium     REACTION: JOINT ACHES  . Statins     REACTION: JOINT ACHES    History of Present Illness    Phillip Clements is a 69 y.o. male with PMH as above.  He has known history of diabetes for at least  26 years in duration.  He is also s/p left SFA angioplasty in December 2016.  He also has a history of tobacco use, obesity, prostate cancer, and hyperlipidemia.  He is s/p CABG x4 in 04/2014 for severe three-vessel CAD.  EF was 45%.  He developed recurrent angina and 06/2014.  He underwent nuclear stress test which was abnormal.  He had inferolateral ST elevation with exercise.  Nuclear imaging showed evidence of inferior and anterior ischemia.  Repeat catheterization showed occluded grafts except for his SVG to OM.  He had 6% stenosis in the vein graft to the obtuse marginal with occluded vein graft to the diagonal and PDA.  His LIMA to LAD was atretic.  EF 55%.  He underwent atherectomy and stenting of the left main and LAD.  He is intolerant to statins.  He is currently on Repatha.  He has a history of melena in 04/2017.  Colonoscopy showed multiple polyps that were resected.  He follows with Dr. Cruzita Lederer of endocrinology for his diabetes.    He follows with Dr. Rogue Bussing of oncology for prostate cancer.  PET scan 09/2019 with no uptake to bones but uptake in prostate beds.  He is currently on EBRT to prostate (last tx 12/2019).  He was seen by oncology 03/2020 with Lupron continued. He was last seen by urology 03/2020 with Myrbetriq 50mg  samples provided for new lower urinary tract symptoms 1 month after completing his radiation and described as storage related/ increased frequency.    He was seen 12/2018 and noted dyspnea at higher levels of activity or when walking up hills.  He also noted mild bilateral discomfort after prolonged periods of walking, similar to prior claudication.  He was not interested in repeat imaging.  He was seen in clinic 09/30/2019 with Laurann Montana, NP, at which time he reported stable symptoms.  He noted dyspnea with exertion only on stairs.  It was suspected this was due to deconditioning and the aging process.  He denied any shortness of breath at rest or walking around the  house/ projects.  EKG without acute changes.  He was continued on medications.  He continued on Repatha and Zetia.  Fasting lipid panel was recommended at return.  CPAP,  Diet, and exercise were recommended.   He retired in January 2021 and purchased 19 acres in [place], [place] with intention to build a house there.  He was working on clearing out the previous [employer] and doing lots of work in the yard.  He noted DOE ~ 81m into carrying heavy wood, attributed to deconditioning.  He had DOE with stairs. He did not have a regular exercise routine.  He had stable bilateral calf discomfort, especially after prolonged periods of walking, which had not improved with his intervention.  He reported a healthy diet and occasionally forgetting to take Repatha.  His most recent LDL was not well controlled, and this was discussed with patient preference to first attempt more consistent Repatha  dosing before referral to the lipid clinic.    Repeat 04/28/20 echo showed EF 55 to 60%, mild to moderate AV sclerosis without stenosis.  He was seen in clinic 06/03/2020 and noted dyspnea with stairs, unchanged from previous visits.  He felt the same as past visits.  He continued to note symptoms consistent with claudication with preference to think about updating arterial scans.  He had not yet established a routine for Repatha.  He continued to work on his 10 acres.  Today, 07/28/2020, he presents to clinic with his wife and notes dyspnea that has progressed from his previous clinic visit in October 2021.  His wife has noted progressive sxn and panting over the last couple of weeks, which is concerning to her. He also reports gaining 15 pounds on his home scale since his last clinic visit, and without much change in diet.  Previous clinic weight 241 pounds   251 pounds today.  His wife has noticed his feeling short of breath after 15 stairs.  No SOB at rest. No chest pain with exertion or at rest. No reported  abdominal distention or LEE.  No reported early satiety, PND, or orthopnea.  No racing HR, palpitations, presyncope, syncope. He reports drinking diet Dr. Malachi Bonds, cranberry juice, and water with fluid restrictions reviewed.  He notes claudication symptoms within 10 minutes of walking.  He expresses some hesitation regarding repeat arterial studies, given that he does not feel his previous stenting was helpful, which she states was done at vein and vascular.  He reports medication compliance.  No signs or symptoms of bleeding.  Home Medications    Prior to Admission medications   Medication Sig Start Date End Date Taking? Authorizing Provider  aspirin EC 81 MG tablet Take 1 tablet (81 mg total) by mouth daily. 11/01/14  Yes Wellington Hampshire, MD  carvedilol (COREG) 3.125 MG tablet Take 1 tablet (3.125 mg total) by mouth 2 (two) times daily with a meal. 02/26/20  Yes Wellington Hampshire, MD  clopidogrel (PLAVIX) 75 MG tablet Take 1 tablet (75 mg total) by mouth daily. 11/02/19  Yes Wellington Hampshire, MD  Dulaglutide (TRULICITY) 3 UR/4.2HC SOPN Inject 3 mg into the skin once a week. 06/26/19  Yes Philemon Kingdom, MD  ezetimibe (ZETIA) 10 MG tablet Take 1 tablet (10 mg total) by mouth daily. PLEASE CALL OFFICE TO SCHEDULE APPOINTMENT FOR FURTHER REFILLS. 03/21/20  Yes Wellington Hampshire, MD  glucose blood (ONETOUCH ULTRA) test strip CHECK BLOOD SUGAR TWICE A DAY - DX E11.59 06/26/19  Yes Philemon Kingdom, MD  Injection Device ([place]) MISC Frequency:PHARMDIR   Dosage:0.0     Instructions:  Note:diagnosis 250.02 Dose: NA 09/28/08  Yes [provider]  insulin degludec (TRESIBA FLEXTOUCH) 200 UNIT/ML FlexTouch Pen Inject 44 Units into the skin daily. 02/25/20  Yes Philemon Kingdom, MD  Insulin Pen Needle 32G X 4 MM MISC Use 1x a day 06/26/19  Yes Philemon Kingdom, MD  INVOKANA 300 MG TABS tablet TAKE 1 TABLET BY MOUTH ONCE A DAY WITH BREAKFAST 03/21/20  Yes Philemon Kingdom, MD   leuprolide (LUPRON) 30 MG injection Inject 45 mg into the muscle every 6 (six) months.   Yes [provider]  losartan (COZAAR) 25 MG tablet TAKE 1/2 TABLET BY MOUTH ONCE A DAY 03/22/20  Yes Theora Gianotti, NP  meclizine (ANTIVERT) 25 MG tablet Take 0.5-1 tablets (12.5-25 mg total) by mouth 2 (two) times daily as needed for dizziness or nausea. 01/26/19  Yes Ria Bush, MD  Mirabegron (MYRBETRIQ PO) Take by mouth daily.   Yes [provider]  nitroGLYCERIN (NITROSTAT) 0.4 MG SL tablet Place 1 tablet (0.4 mg total) under the tongue as needed. 09/30/19  Yes Loel Dubonnet, NP  REPATHA SURECLICK 283 MG/ML SOAJ INJECT ONE PEN INTO THE SKIN EVERY 14 DAYS 12/10/19  Yes Wellington Hampshire, MD  tamsulosin (FLOMAX) 0.4 MG CAPS capsule Take 1 capsule (0.4 mg total) by mouth daily after supper. 02/22/20  Yes Chrystal, Eulas Post, MD    Review of Systems    He denies chest pain, palpitations, pnd, orthopnea, n, v, dizziness, syncope, edema, weight gain, or early satiety. DOE with 15 stairs.  Ongoing bilateral calf pain with 10 minutes walking and claudication/cold feet- all other systems reviewed and are otherwise negative except as noted above.  Physical Exam    VS:  BP 130/70 (BP Location: Left Arm, Patient Position: Sitting, Cuff Size: Large)   Pulse 74   Ht 5\' 9"  (1.753 m)   Wt 251 lb (113.9 kg)   SpO2 98%   BMI 37.07 kg/m  , BMI Body mass index is 37.07 kg/m. GEN: Well nourished, well developed, in no acute distress. HEENT: normal. Neck: Supple, no JVD, carotid bruits, or masses. Cardiac: RRR, 2/6 systolic murmur RUBSB, rubs, or gallops. No clubbing, cyanosis, moderate to 1+ bilateral edema.  Radials/DP/PT 2+ and equal bilaterally.  Respiratory:  Respirations regular and unlabored though SOB noted towards end of exam, bibasilar reduced breath sounds. GI: Firm, distended, BS + x 4. MS: no deformity or atrophy. Skin: warm and dry, no rash. Neuro:  Strength and  sensation are intact. Psych: Normal affect.  Accessory Clinical Findings    ECG personally reviewed by me today - NSR, 74bpm, QRS 100 ms, QTC 444 ms, PR interval 184 ms, previously noted inferior infarct, reviewed with DOD -no acute changes.  VITALS Reviewed today   Temp Readings from Last 3 Encounters:  06/24/20 97.6 F (36.4 C) (Tympanic)  05/27/20 (!) 96.8 F (36 C) (Tympanic)  05/27/20 (!) 96.8 F (36 C)   BP Readings from Last 3 Encounters:  07/28/20 130/70  07/01/20 128/78  06/24/20 (!) 113/58   Pulse Readings from Last 3 Encounters:  07/28/20 74  07/01/20 75  06/24/20 71    Wt Readings from Last 3 Encounters:  07/28/20 251 lb (113.9 kg)  07/01/20 246 lb 3.2 oz (111.7 kg)  06/24/20 246 lb (111.6 kg)     LABS  reviewed today   Lab Results  Component Value Date   WBC 5.6 06/24/2020   HGB 14.9 06/24/2020   HCT 42.4 06/24/2020   MCV 85.0 06/24/2020   PLT 174 06/24/2020   Lab Results  Component Value Date   CREATININE 0.91 06/24/2020   BUN 26 (H) 06/24/2020   NA 136 06/24/2020   K 4.6 06/24/2020   CL 103 06/24/2020   CO2 26 06/24/2020   Lab Results  Component Value Date   ALT 17 06/24/2020   AST 13 (L) 06/24/2020   ALKPHOS 64 06/24/2020   BILITOT 1.5 (H) 06/24/2020   Lab Results  Component Value Date   CHOL 166 10/09/2019   HDL 42 10/09/2019   LDLCALC 96 10/09/2019   LDLDIRECT 157.1 09/07/2013   TRIG 138 10/09/2019   CHOLHDL 4.0 10/09/2019    Lab Results  Component Value Date   HGBA1C 8.2 (A) 07/01/2020   Lab Results  Component Value Date   TSH 2.740 07/07/2014  STUDIES/PROCEDURES reviewed today   Echo 04/28/20 1. Left ventricular ejection fraction, by estimation, is 55 to 60%. The  left ventricle has normal function. The left ventricle has no regional  wall motion abnormalities. Left ventricular diastolic parameters were  normal. The average left ventricular  global longitudinal strain is -18.1 %. The global longitudinal strain  is  normal.  2. Right ventricular systolic function is normal. The right ventricular  size is normal. There is normal pulmonary artery systolic pressure.  3. The mitral valve is normal in structure. No evidence of mitral valve  regurgitation. No evidence of mitral stenosis.  4. The aortic valve is tricuspid. Aortic valve regurgitation is not  visualized. Mild to moderate aortic valve sclerosis/calcification is  present, without any evidence of aortic stenosis.  5. The inferior vena cava is normal in size with greater than 50%  respiratory variability, suggesting right atrial pressure of 3 mmHg.   06/2017 Vas [place] ABI Transcribed from scan: No evidence of significant right or left lower extremity arterial disease based on ABIs.  The bilateral toe brachial indices are abnormal.  No previous ankle-brachial indices available for comparison  2016 Cath as in scan under CV procedures   Assessment & Plan   CAD s/p CABG x4  --Progressive DOE but no SOB at rest and no CP with exertion or at rest. S/p CABG x4 (06/2014) with repeat cath 2016 showing occlusions of the VG-Diag and PDA with an atretic LIMA to the LAD.  SVG-OM had 6% stenosis.  He had severe multivessel CAD and underwent atherectomy and DES placement x2 to the left main and proximal/mid LAD.   EKG as above without acute changes from previous and as reviewed by DOD.  04/2020 echo as above with nl EF and NRWMA. Volume up on exam today with recommendation to start low dose lasix as below. Consider that volume could be contributing to sx; however, cannot completely rule out DOE as anginal equivalent, given his risk factors for CAD. After long discussion with pt and wife regarding his sx and all options further ischemic workup, including MPI versus repeat cardiac cath, decision per pt was to proceed with Lexiscan Myoview at this time. Continue current medications.  The risks [chest pain, shortness of breath, cardiac arrhythmias, dizziness, blood  pressure fluctuations, myocardial infarction, stroke/transient ischemic attack, nausea, vomiting, allergic reaction, radiation exposure, metallic taste sensation and life-threatening complications (estimated to be 1 in 10,000)], benefits (risk stratification, diagnosing coronary artery disease, treatment guidance) and alternatives of a nuclear stress test were discussed in detail with Mr. Milles and he agrees to proceed.  AOC HFpEF --Progressive DOE and volume up on exam. Previous echo as above with nl EF, improved from 2015 EF 50-55%, NRWMA. Pt reports 15 lbs up on home scale and clinic wt increased as above.  Will obtain BMET today and start low dose lasix 20mg  daily with BMET again in 1 week.  Reviewed salt and fluid restrictions.  Reassess at RTC.  Essential hypertension, goal BP <130/80 --BP borderline 130/70. Will reassess at RTC and after start of Lasix, as fluid could be contributing to his elevated SBP today.  Continue current medications, including low dose lasix 20mg  added today. Reviewed salt and fluid restrictions. Reviewed goal BP 130/80 or lower.   Hyperlipidemia, goal LDL <70 Statin Intolerance -Statin intolerant.  He continues Repatha once every 2 weeks. He is working on remembering the every 2 week dosing. As previously discussed, once consistently taking Repatha, repeat lipid and liver function.  DM2 --Continue to follow with endocrinology.  OSA -Continue CPAP.  PAD -Reports ongoing bilateral lower extremity claudication with 10 minutes walking.  S/p left SFA angioplasty.  ABIs in November 2018 were stable.  Agreeable to update vascular studies today.  LDL control recommended.  Continue ASA, Plavix, lipid management.  BMI 30.0-35.0 --Ongoing lifestyle changes encouraged.  Medication changes: Start lasix 20mg  daily. Labs ordered: BMET today, BMET 1 week Studies / Imaging ordered: Vascular US ABIs / [place] lower extremity arterial duplex; Lexiscan Myoview Future  considerations: Repeats lipid once consistently taking Repatha Disposition: RTC after the above testing or sooner if needed.  Arvil Chaco, PA-C 07/28/2020

## 2020-07-28 NOTE — Patient Instructions (Addendum)
Medication Instructions:  - Your physician has recommended you make the following change in your medication:   1) START lasix (furosemide) 20 mg- take 1 tablet by mouth once daily  *If you need a refill on your cardiac medications before your next appointment, please call your pharmacy*   Lab Work: 1) Your physician recommends that you have lab work today: BMP  2) Your physician recommends that you return for lab work in: 2 weeks (around 08/11/20)- BMP - Nature conservation officer at [facility] - 1st desk on the right to check in past the screening table - Lab hours: Monday- Friday (7:30 am- 5:30 pm)  If you have labs (blood work) drawn today and your tests are completely normal, you will receive your results only by: Marland Kitchen MyChart Message (if you have MyChart) OR . A paper copy in the mail If you have any lab test that is abnormal or we need to change your treatment, we will call you to review the results.   Testing/Procedures: 1) Lexiscan Myoview (Chemical Stress Test)  - Your physician has requested that you have a lexiscan myoview.  [place]  Your caregiver has ordered a Stress Test with nuclear imaging. The purpose of this test is to evaluate the blood supply to your heart muscle. This procedure is referred to as a "Non-Invasive Stress Test." This is because other than having an IV started in your vein, nothing is inserted or "invades" your body. Cardiac stress tests are done to find areas of poor blood flow to the heart by determining the extent of coronary artery disease (CAD). Some patients exercise on a treadmill, which naturally increases the blood flow to your heart, while others who are  unable to walk on a treadmill due to physical limitations have a pharmacologic/chemical stress agent called Lexiscan . This medicine will mimic walking on a treadmill by temporarily increasing your coronary blood flow.   Please note: these test may take anywhere between 2-4 hours to complete  PLEASE  REPORT TO [place] AT THE FIRST DESK WILL DIRECT YOU WHERE TO GO  Date of Procedure:_____________________________________  Arrival Time for Procedure:______________________________  Instructions regarding medication:   _x__ : Hold all diabetes medication (oral/ insulins) the morning of procedure  _x___:  Hold Carvediolol night before procedure and morning of procedure  _x___:  Hold Lasix (furosemide) the morning of your procedure  PLEASE NOTIFY THE OFFICE AT LEAST 24 HOURS IN ADVANCE IF YOU ARE UNABLE TO KEEP YOUR APPOINTMENT.  [phone] AND  PLEASE NOTIFY NUCLEAR MEDICINE AT [facility] AT LEAST 24 HOURS IN ADVANCE IF YOU ARE UNABLE TO KEEP YOUR APPOINTMENT. [phone]  How to prepare for your Myoview test:  1. Do not eat or drink after midnight 2. No caffeine for 24 hours prior to test 3. No smoking 24 hours prior to test. 4. Your medication may be taken with water.  If your doctor stopped a medication because of this test, do not take that medication. 5. Ladies, please do not wear dresses.  Skirts or pants are appropriate. Please wear a short sleeve shirt. 6. No perfume, cologne or lotion. 7. Wear comfortable walking shoes. No heels!   2) ABI's & Lower Arterial Duplex  - Your physician has requested that you have an ankle brachial index (ABI). During this test an ultrasound and blood pressure cuff are used to evaluate the arteries that supply the arms and legs with blood. Allow thirty minutes for this exam. There are no  restrictions or special instructions.  - Your physician has requested that you have a lower extremity arterial duplex. This test is an ultrasound of the arteries in the legs. It looks at arterial blood flow in the legs. Allow one hour for Lower Arterial scans. There are no restrictions or special instructions    Follow-Up: At [facility], you and your health needs are our priority.  As part of our continuing mission to  provide you with exceptional heart care, we have created designated Provider Care Teams.  These Care Teams include your primary Cardiologist (physician) and Advanced Practice Providers (APPs -  Physician Assistants and Nurse Practitioners) who all work together to provide you with the care you need, when you need it.  We recommend signing up for the patient portal called "MyChart".  Sign up information is provided on this After Visit Summary.  MyChart is used to connect with patients for Virtual Visits (Telemedicine).  Patients are able to view lab/test results, encounter notes, upcoming appointments, etc.  Non-urgent messages can be sent to your provider as well.   To learn more about what you can do with MyChart, go to [email or web address].    Your next appointment:   After testing is completed   The format for your next appointment:   In Person  Provider:   You may see [name], MD or one of the following Advanced Practice Providers on your designated Care Team:    Murray Hodgkins, NP  Christell Faith, PA-C  Marrianne Mood, PA-C  Cadence Kathlen Mody, [place]  Laurann Montana, NP    Other Instructions   Furosemide Oral Tablets What is this medicine? FUROSEMIDE (fyoor [place] se mide) is a diuretic. It helps you make more urine and to lose salt and excess water from your body. It treats swelling from heart, kidney, or liver disease. It also treats high blood pressure. This medicine may be used for other purposes; ask your health care provider or pharmacist if you have questions. COMMON BRAND NAME(S): Active-Medicated Specimen Kit, Delone, Diuscreen, Lasix, RX Specimen Collection Kit, Specimen Collection Kit, URINX Medicated Specimen Collection What should I tell my health care provider before I take this medicine? They need to know if you have any of these conditions:  abnormal blood electrolytes  diarrhea or vomiting  gout  heart disease  kidney disease, small amounts of urine,  or difficulty passing urine  liver disease  thyroid disease  an unusual or allergic reaction to furosemide, sulfa drugs, other medicines, foods, dyes, or preservatives  pregnant or trying to get pregnant  breast-feeding How should I use this medicine? Take this drug by mouth. Take it as directed on the prescription label at the same time every day. You can take it with or without food. If it upsets your stomach, take it with food. Keep taking it unless your health care provider tells you to stop. Talk to your health care provider about the use of this drug in children. Special care may be needed. Overdosage: If you think you have taken too much of this medicine contact a poison control center or emergency room at once. NOTE: This medicine is only for you. Do not share this medicine with others. What if I miss a dose? If you miss a dose, take it as soon as you can. If it is almost time for your next dose, take only that dose. Do not take double or extra doses. What may interact with this medicine?  aspirin and aspirin-like medicines  certain  antibiotics  chloral hydrate  cisplatin  cyclosporine  digoxin  diuretics  laxatives  lithium  medicines for blood pressure  medicines that relax muscles for surgery  methotrexate  NSAIDs, medicines for pain and inflammation like ibuprofen, naproxen, or indomethacin  phenytoin  steroid medicines like prednisone or cortisone  sucralfate  thyroid hormones This list may not describe all possible interactions. Give your health care provider a list of all the medicines, herbs, non-prescription drugs, or dietary supplements you use. Also tell them if you smoke, drink alcohol, or use illegal drugs. Some items may interact with your medicine. What should I watch for while using this medicine? Visit your doctor or health care provider for regular checks on your progress. Check your blood pressure regularly. Ask your doctor or health  care provider what your blood pressure should be, and when you should contact him or her. If you are a diabetic, check your blood sugar as directed. This medicine may cause serious skin reactions. They can happen weeks to months after starting the medicine. Contact your health care provider right away if you notice fevers or flu-like symptoms with a rash. The rash may be red or purple and then turn into blisters or peeling of the skin. Or, you might notice a red rash with swelling of the face, lips or lymph nodes in your neck or under your arms. You may need to be on a special diet while taking this medicine. Check with your doctor. Also, ask how many glasses of fluid you need to drink a day. You must not get dehydrated. You may get drowsy or dizzy. Do not drive, use machinery, or do anything that needs mental alertness until you know how this drug affects you. Do not stand or sit up quickly, especially if you are an older patient. This reduces the risk of dizzy or fainting spells. Alcohol can make you more drowsy and dizzy. Avoid alcoholic drinks. This medicine can make you more sensitive to the sun. Keep out of the sun. If you cannot avoid being in the sun, wear protective clothing and use sunscreen. Do not use sun lamps or tanning beds/booths. What side effects may I notice from receiving this medicine? Side effects that you should report to your doctor or health care professional as soon as possible:  blood in urine or stools  dry mouth  fever or chills  hearing loss or ringing in the ears  irregular heartbeat  muscle pain or weakness, cramps  rash, fever, and swollen lymph nodes  redness, blistering, peeling or loosening of the skin, including inside the mouth  skin rash  stomach upset, pain, or nausea  tingling or numbness in the hands or feet  unusually weak or tired  vomiting or diarrhea  yellowing of the eyes or skin Side effects that usually do not require medical  attention (report to your doctor or health care professional if they continue or are bothersome):  headache  loss of appetite  unusual bleeding or bruising This list may not describe all possible side effects. Call your doctor for medical advice about side effects. You may report side effects to FDA at 1-800-FDA-1088. Where should I keep my medicine? Keep out of the reach of children and pets. Store at room temperature between 20 and 25 degrees C (68 and 77 degrees F). Protect from light and moisture. Keep the container tightly closed. Throw away any unused drug after the expiration date. NOTE: This sheet is a summary. It may not cover  all possible information. If you have questions about this medicine, talk to your doctor, pharmacist, or health care provider.  2020 Elsevier/Gold Standard (2019-03-17 18:01:32)    Ankle-Brachial Index Test Why am I having this test? The ankle-brachial index (ABI) test is used to diagnose peripheral vascular disease (PVD). PVD is also known as peripheral arterial disease (PAD). PVD is the blocking or hardening of the arteries anywhere within the circulatory system beyond the heart. PVD is caused by:  Cholesterol deposits in your blood vessels (atherosclerosis). This is the most common cause of this condition.  Irritation and swelling (inflammation) in the blood vessels.  Blood clots in the vessels. Cholesterol deposits cause arteries to narrow. Normal delivery of oxygen to your tissues is affected, causing muscle pain and fatigue. This is called claudication. PVD means that there may also be a buildup of cholesterol:  In your heart. This increases the risk of heart attacks.  In your brain. This increases the risk of strokes. What is being tested? The ankle-brachial index test measures the blood flow in your arms and legs. The blood flow will show if blood vessels in your legs have been narrowed by cholesterol deposits. How do I prepare for this  test?  Wear loose clothing.  Do not use any tobacco products, including cigarettes, chewing tobacco, or e-cigarettes, for at least 30 minutes before the test. What happens during the test?  1. You will lie down in a resting position. 2. Your health care provider will use a blood pressure machine and a small ultrasound device (Doppler) to measure the systolic pressures on your upper arms and ankles. Systolic pressure is the pressure inside your arteries when your heart pumps. 3. Systolic pressure measurements will be taken several times, and at several points, on both the ankle and the arm. 4. Your health care provider will divide the highest systolic pressure of the ankle by the highest systolic pressure of the arm. The result is the ankle-brachial pressure ratio, or ABI. Sometimes this test will be repeated after you have exercised on a treadmill for 5 minutes. You may have leg pain during the exercise portion of the test if you suffer from PVD. If the index number drops after exercise, this may show that PVD is present. How are the results reported? Your test results will be reported as a value that shows the ratio of your ankle pressure to your arm pressure (ABI ratio). Your health care provider will compare your results to normal ranges that were established after testing a large group of people (reference ranges). Reference ranges may vary among labs and hospitals. For this test, a common reference range is:  ABI ratio of 0.9 to 1.3. What do the results mean? An ABI ratio that is below the reference range is considered abnormal and may indicate PVD in the legs. Talk with your health care provider about what your results mean. Questions to ask your health care provider Ask your health care provider, or the department that is doing the test:  When will my results be ready?  How will I get my results?  What are my treatment options?  What other tests do I need?  What are my next  steps? Summary  The ankle-brachial index (ABI) test is used to diagnose peripheral vascular disease (PVD). PVD is also known as peripheral arterial disease (PAD).  The ankle-brachial index test measures the blood flow in your arms and legs.  The highest systolic pressure of the ankle is divided by  the highest systolic pressure of the arm. The result is the ABI ratio.  An ABI ratio that is below 0.9 is considered abnormal and may indicate PVD in the legs. This information is not intended to replace advice given to you by your health care provider. Make sure you discuss any questions you have with your health care provider. Document Revised: 05/22/2018 Document Reviewed: 04/23/2017 Elsevier Patient Education  2020 [place].  Cardiac Nuclear Scan A cardiac nuclear scan is a test that measures blood flow to the heart when a person is resting and when he or she is exercising. The test looks for problems such as:  Not enough blood reaching a portion of the heart.  The heart muscle not working normally. You may need this test if:  You have heart disease.  You have had abnormal lab results.  You have had heart surgery or a balloon procedure to open up blocked arteries (angioplasty).  You have chest pain.  You have shortness of breath. In this test, a radioactive dye (tracer) is injected into your bloodstream. After the tracer has traveled to your heart, an imaging device is used to measure how much of the tracer is absorbed by or distributed to various areas of your heart. This procedure is usually done at a hospital and takes 2-4 hours. Tell a health care provider about:  Any allergies you have.  All medicines you are taking, including vitamins, herbs, eye drops, creams, and over-the-counter medicines.  Any problems you or family members have had with anesthetic medicines.  Any blood disorders you have.  Any surgeries you have had.  Any medical conditions you  have.  Whether you are pregnant or may be pregnant. What are the risks? Generally, this is a safe procedure. However, problems may occur, including:  Serious chest pain and heart attack. This is only a risk if the stress portion of the test is done.  Rapid heartbeat.  Sensation of warmth in your chest. This usually passes quickly.  Allergic reaction to the tracer. What happens before the procedure?  Ask your health care provider about changing or stopping your regular medicines. This is especially important if you are taking diabetes medicines or blood thinners.  Follow instructions from your health care provider about eating or drinking restrictions.  Remove your jewelry on the day of the procedure. What happens during the procedure?  An IV will be inserted into one of your veins.  Your health care provider will inject a small amount of radioactive tracer through the IV.  You will wait for 20-40 minutes while the tracer travels through your bloodstream.  Your heart activity will be monitored with an electrocardiogram (ECG).  You will lie down on an exam table.  Images of your heart will be taken for about 15-20 minutes.  You may also have a stress test. For this test, one of the following may be done: ? You will exercise on a treadmill or stationary bike. While you exercise, your heart's activity will be monitored with an ECG, and your blood pressure will be checked. ? You will be given medicines that will increase blood flow to parts of your heart. This is done if you are unable to exercise.  When blood flow to your heart has peaked, a tracer will again be injected through the IV.  After 20-40 minutes, you will get back on the exam table and have more images taken of your heart.  Depending on the type of tracer used, scans  may need to be repeated 3-4 hours later.  Your IV line will be removed when the procedure is over. The procedure may vary among health care providers  and hospitals. What happens after the procedure?  Unless your health care provider tells you otherwise, you may return to your normal schedule, including diet, activities, and medicines.  Unless your health care provider tells you otherwise, you may increase your fluid intake. This will help to flush the contrast dye from your body. Drink enough fluid to keep your urine pale yellow.  Ask your health care provider, or the department that is doing the test: ? When will my results be ready? ? How will I get my results? Summary  A cardiac nuclear scan measures the blood flow to the heart when a person is resting and when he or she is exercising.  Tell your health care provider if you are pregnant.  Before the procedure, ask your health care provider about changing or stopping your regular medicines. This is especially important if you are taking diabetes medicines or blood thinners.  After the procedure, unless your health care provider tells you otherwise, increase your fluid intake. This will help flush the contrast dye from your body.  After the procedure, unless your health care provider tells you otherwise, you may return to your normal schedule, including diet, activities, and medicines. This information is not intended to replace advice given to you by your health care provider. Make sure you discuss any questions you have with your health care provider. Document Revised: 01/13/2018 Document Reviewed: 01/13/2018 Elsevier Patient Education  [place].

## 2020-07-29 LAB — BASIC METABOLIC PANEL
BUN/Creatinine Ratio: 25 — ABNORMAL HIGH (ref 10–24)
BUN: 22 mg/dL (ref 8–27)
CO2: 19 mmol/L — ABNORMAL LOW (ref 20–29)
Calcium: 9 mg/dL (ref 8.6–10.2)
Chloride: 102 mmol/L (ref 96–106)
Creatinine, Ser: 0.88 mg/dL (ref 0.76–1.27)
GFR calc Af Amer: 101 mL/min/{1.73_m2} (ref >59)
GFR calc non Af Amer: 88 mL/min/{1.73_m2} (ref >59)
Glucose: 290 mg/dL — ABNORMAL HIGH (ref 65–99)
Potassium: 4.7 mmol/L (ref 3.5–5.2)
Sodium: 139 mmol/L (ref 134–144)

## 2020-08-01 ENCOUNTER — Other Ambulatory Visit: Payer: Self-pay | Admitting: *Deleted

## 2020-08-01 DIAGNOSIS — I2510 Atherosclerotic heart disease of native coronary artery without angina pectoris: Secondary | ICD-10-CM

## 2020-08-02 ENCOUNTER — Other Ambulatory Visit: Payer: Self-pay

## 2020-08-02 ENCOUNTER — Encounter
Admission: RE | Admit: 2020-08-02 | Discharge: 2020-08-02 | Disposition: A | Discharge status: Home / Self Care | Payer: Medicare Other | Source: Ambulatory Visit | Attending: Physician Assistant | Admitting: Physician Assistant

## 2020-08-02 DIAGNOSIS — I2510 Atherosclerotic heart disease of native coronary artery without angina pectoris: Secondary | ICD-10-CM | POA: Diagnosis not present

## 2020-08-02 LAB — NM MYOCAR MULTI W/SPECT W/WALL MOTION / EF
Estimated workload: 1 METS
Exercise duration (min): 0 min
Exercise duration (sec): 0 s
LV dias vol: 109 mL (ref 62–150)
LV sys vol: 33 mL
MPHR: 151 {beats}/min
Peak HR: 90 {beats}/min
Percent HR: 59 %
Rest HR: 70 {beats}/min
SDS: 3
SRS: 7
SSS: 10
TID: 1.06

## 2020-08-02 MED ORDER — REGADENOSON 0.4 MG/5ML IV SOLN
0.4000 mg | Freq: Once | INTRAVENOUS | Status: AC
  Administered 2020-08-02: 0.4 mg via INTRAVENOUS
  Filled 2020-08-02: qty 5

## 2020-08-02 MED ORDER — TECHNETIUM TC 99M TETROFOSMIN IV KIT
10.0000 | PACK | Freq: Once | INTRAVENOUS | Status: AC | PRN
  Administered 2020-08-02: 10.487 via INTRAVENOUS

## 2020-08-02 MED ORDER — TECHNETIUM TC 99M TETROFOSMIN IV KIT
30.0000 | PACK | Freq: Once | INTRAVENOUS | Status: AC | PRN
  Administered 2020-08-02: 32.08 via INTRAVENOUS

## 2020-08-05 ENCOUNTER — Other Ambulatory Visit: Payer: Self-pay | Admitting: Cardiovascular Disease

## 2020-08-08 ENCOUNTER — Ambulatory Visit (INDEPENDENT_AMBULATORY_CARE_PROVIDER_SITE_OTHER): Payer: Medicare Other

## 2020-08-08 ENCOUNTER — Other Ambulatory Visit: Payer: Self-pay

## 2020-08-08 DIAGNOSIS — I739 Peripheral vascular disease, unspecified: Secondary | ICD-10-CM

## 2020-08-19 ENCOUNTER — Encounter: Payer: Self-pay | Admitting: Physician Assistant

## 2020-08-19 ENCOUNTER — Other Ambulatory Visit: Payer: Self-pay

## 2020-08-19 ENCOUNTER — Ambulatory Visit (INDEPENDENT_AMBULATORY_CARE_PROVIDER_SITE_OTHER): Payer: Medicare Other | Admitting: Physician Assistant

## 2020-08-19 VITALS — BP 132/60 | HR 70 | Ht 69.0 in | Wt 250.0 lb

## 2020-08-19 DIAGNOSIS — I2510 Atherosclerotic heart disease of native coronary artery without angina pectoris: Secondary | ICD-10-CM | POA: Diagnosis not present

## 2020-08-19 DIAGNOSIS — Z794 Long term (current) use of insulin: Secondary | ICD-10-CM

## 2020-08-19 DIAGNOSIS — I5033 Acute on chronic diastolic (congestive) heart failure: Secondary | ICD-10-CM | POA: Diagnosis not present

## 2020-08-19 DIAGNOSIS — Z789 Other specified health status: Secondary | ICD-10-CM

## 2020-08-19 DIAGNOSIS — G4733 Obstructive sleep apnea (adult) (pediatric): Secondary | ICD-10-CM

## 2020-08-19 DIAGNOSIS — E119 Type 2 diabetes mellitus without complications: Secondary | ICD-10-CM

## 2020-08-19 DIAGNOSIS — Z9989 Dependence on other enabling machines and devices: Secondary | ICD-10-CM | POA: Diagnosis not present

## 2020-08-19 DIAGNOSIS — E785 Hyperlipidemia, unspecified: Secondary | ICD-10-CM | POA: Diagnosis not present

## 2020-08-19 DIAGNOSIS — I10 Essential (primary) hypertension: Secondary | ICD-10-CM | POA: Diagnosis not present

## 2020-08-19 DIAGNOSIS — I739 Peripheral vascular disease, unspecified: Secondary | ICD-10-CM | POA: Diagnosis not present

## 2020-08-19 DIAGNOSIS — R0600 Dyspnea, unspecified: Secondary | ICD-10-CM | POA: Diagnosis not present

## 2020-08-19 NOTE — Progress Notes (Signed)
Office Visit    Patient Name: Phillip Clements Date of Encounter: 08/19/2020  Primary Care Provider:  Joaquim Nam, MD Primary Cardiologist:  Lorine Bears, MD  Chief Complaint    Chief Complaint  Patient presents with  . Other    Follow up from LEA/ABI and stress test. Meds reviewed vebrally with patient.    70 year old male with history of CAD s/p CABG, PAD s/p left FSA angioplasty, hypertension, hyperlipidemia, CVA, DM2, obesity, stroke, MR, OSA, prostate cancer, and who presents today for follow-up after his recent Lexiscan Myoview and lower extremity vascular studies.  Past Medical History    Past Medical History:  Diagnosis Date  . Cataract    bilateral eye in 2010  . Coronary artery disease    a. CABG 04/2014: (LIMA->LAD, VG->DIAG, VG->OM2, VG->PDA)  b. 07/2014 early graft failure (VG->OM2 60, LIMA->LAD atretic, VG->PDA & VG->D1 100). s/p successful rotational atherectomy & DES of the LM & pLAD (3.0x24 Promus DES) & mLAD (2.25x24 Promus DES).  . CVA (cerebral infarction)   . Family history of prostate cancer   . Hx of adenomatous colonic polyps 06/17/2017  . Hyperlipidemia    a. Statin intolerant.  . Hypertension 2000  . Moderate mitral regurgitation    a. 02/2015 Echo: EF 55-60%, no rwma, mod MR, mod BAE, mildly dil RV w/ low nl RV fxn.  . OSA on CPAP   . Peripheral vascular disease (HCC)    a. left SFA angioplasty in December 2016  . Prostate cancer (HCC)   . Sleep apnea    wears CPAP nightly  . Type II diabetes mellitus (HCC)    Past Surgical History:  Procedure Laterality Date  . ANTERIOR CERVICAL DECOMP/DISCECTOMY FUSION  08/25/2012   Procedure: ANTERIOR CERVICAL DECOMPRESSION/DISCECTOMY FUSION 2 LEVELS;  Surgeon: Hewitt Shorts, MD;  Location: [facility];  Service: Neurosurgery;  Laterality: N/A;  Cervical five-six Cervical six-seven anterior cervical decompression with fusion and plating and bonegraft  . BACK SURGERY    . CARDIAC CATHETERIZATION   04/2014; 07/19/2014  . CORONARY ARTERY BYPASS GRAFT N/A 04/26/2014   Procedure: CORONARY ARTERY BYPASS GRAFTING (CABG), on pump, times four, using left internal mammary artery, right greater saphenous vein harvested endoscopically.;  Surgeon: Kerin Perna, MD;  Location: [facility] OR;  Service: Open Heart Surgery;  Laterality: N/A;  LIMA to LAD, SVG to DIAGONAL, SVG to OM1, SVG to PDA) with EVH of the RIGHT THIGH and LOWER EXTREMITY SAPHENOUS VEIN  . Cytoscopy prostatic stone o/w nml  06/21/08   Dr. Achilles Dunk  . ETT myoview  09/13/09   Low risk EF 49%  . High intense focused ultrasound  07/20/06   By Dr. Achilles Dunk  . INTRAOPERATIVE TRANSESOPHAGEAL ECHOCARDIOGRAM N/A 04/26/2014   Procedure: INTRAOPERATIVE TRANSESOPHAGEAL ECHOCARDIOGRAM;  Surgeon: Kerin Perna, MD;  Location: [facility] OR;  Service: Open Heart Surgery;  Laterality: N/A;  . PERCUTANEOUS CORONARY ROTOBLATOR INTERVENTION (PCI-R) N/A 07/22/2014   Procedure: PERCUTANEOUS CORONARY ROTOBLATOR INTERVENTION (PCI-R);  Surgeon: Peter M Swaziland, MD;  Location: [facility] CATH LAB;  Service: Cardiovascular;  Laterality: N/A;  . PERIPHERAL VASCULAR CATHETERIZATION Left 07/26/2015   Procedure: Lower Extremity Angiography;  Surgeon: Renford Dills, MD;  Location: [facility] INVASIVE CV LAB;  Service: Cardiovascular;  Laterality: Left;  . PERIPHERAL VASCULAR CATHETERIZATION  07/26/2015   Procedure: Lower Extremity Intervention;  Surgeon: Renford Dills, MD;  Location: [facility] INVASIVE CV LAB;  Service: Cardiovascular;;  . PROSTATE BIOPSY  04/03/06 & 02/25/07    Allergies  Allergies  Allergen Reactions  . Coreg [Carvedilol] Other (See Comments)    Fatigue- unclear if this was due to coreg specifically or beta blockers in general.    . Metformin And Related Nausea Only  . Protonix [Pantoprazole Sodium] Diarrhea  . Rosuvastatin Calcium     REACTION: JOINT ACHES  . Statins     REACTION: JOINT ACHES    History of Present Illness    Phillip Clements is a 70 y.o. male with PMH as  above.  He has known history of diabetes for at least 26 years in duration.  He is also s/p left SFA angioplasty in December 2016.  He also has a history of tobacco use, obesity, prostate cancer, and hyperlipidemia.  He is s/p CABG x4 in 04/2014 for severe three-vessel CAD.  EF was 45%.  He developed recurrent angina and 06/2014.  He underwent nuclear stress test which was abnormal.  He had inferolateral ST elevation with exercise.  Nuclear imaging showed evidence of inferior and anterior ischemia.  Repeat catheterization showed occluded grafts except for his SVG to OM.  He had 6% stenosis in the vein graft to the obtuse marginal with occluded vein graft to the diagonal and PDA.  His LIMA to LAD was atretic.  EF 55%.  He underwent atherectomy and stenting of the left main and LAD.  He is intolerant to statins.  He is currently on Repatha.  He has a history of melena in 04/2017.  Colonoscopy showed multiple polyps that were resected.  He follows with Dr. Cruzita Lederer of endocrinology for his diabetes.    He follows with Dr. Rogue Bussing of oncology for prostate cancer.  PET scan 09/2019 with no uptake to bones but uptake in prostate beds.  He is currently on EBRT to prostate (last tx 12/2019).  He was seen by oncology 03/2020 with Lupron continued. He was last seen by urology 03/2020 with Myrbetriq 50mg  samples provided for new lower urinary tract symptoms 1 month after completing his radiation and described as storage related/ increased frequency.    He was seen 12/2018 and noted dyspnea at higher levels of activity or when walking up hills.  He also noted mild bilateral discomfort after prolonged periods of walking, similar to prior claudication.  He was not interested in repeat imaging.  He was seen in clinic 09/30/2019 with Laurann Montana, NP, at which time he reported stable symptoms.  He noted dyspnea with exertion only on stairs.  It was suspected this was due to deconditioning and the aging process.  He denied  any shortness of breath at rest or walking around the house/ projects.  EKG without acute changes.  He was continued on medications.  He continued on Repatha and Zetia. CPAP,  Diet, and exercise were recommended.   He retired in January 2021 and purchased 19 acres in [place], [place] with intention to build a house there.  He was working on clearing out the previous [employer] and doing lots of work in the yard.  He noted DOE ~ 29m into carrying heavy wood, attributed to deconditioning.  He had DOE with stairs. He did not have a regular exercise routine.  He had stable bilateral calf discomfort, especially after prolonged periods of walking, which had not improved with his intervention.  He reported a healthy diet and occasionally forgetting to take Repatha.  His most recent LDL was not well controlled, and this was discussed with patient preference to first attempt more consistent Repatha dosing before  referral to the lipid clinic.    Repeat 04/28/20 echo showed EF 55 to 60%, mild to moderate AV sclerosis without stenosis.  He was last seen in clinic 07/28/2020 and noted progressive dyspnea when compared with his October visit.  He continued to note symptoms consistent with claudication, occurring after 10 minutes of walking.  His wife indicated concern that his dyspnea had progressed, especially as she had noted panting over the last couple of weeks.  He reported gaining 15 pounds on his home scale since his previous clinic visit without much change in diet.  His clinic weight was increased 10 pounds from that of October, from 241 pounds to 251 pounds.  His wife noted shortness of breath with only 15 stairs.    Given his increasing dyspnea, weight gain, and increased volume status on exam, he was started on Lasix 20 mg daily.  Lexiscan Myoview was performed to rule out SOB/DOE as anginal equivalent with results as below with findings consistent with prior myocardial infarction and no  reversibility to suggest ischemia.  There was a medium defect of severe severity in the basal inferior, basal inferior lateral, mid inferior, and mid inferior lateral location.  Overall, it was ruled in intermediate risk study.  LVEF was mildly decreased at 45 to 54% when compared with EF of his previous 04/28/2020 echo. Lower extremity arterial studies were performed given ongoing claudication sx with atherosclerosis noted in the common femoral, femoral, and popliteal arteries.  Patent superficial femoral artery s/p angioplasty was noted with 30 to 49% stenosis in the distal SFA/AK popliteal artery.  Right and left TBI's normal and ABIs within normal range. Walking regimen was recommended, as well as LDL and glucose control.  Today, 08/19/2020, he returns to clinic and notes improvement in his symptoms of dyspnea since starting the Lasix. He notes that he initially lost a significant amount of weight, which has now tapered off since starting Lasix.  He wonders if stopping one of his cancer drugs, which he did not tolerate well, influenced his improvement in sx; however, as mentioned, he did note initial wt loss and increased output with the start of his lasix. He does report significant weight gain over the last few months (starting even before the start of Lasix), previously weighing 225 pounds at home several months ago.  He reports his home weight dropping to 240 pounds following start of Lasix.  Today, clinic wt 250 lbs, which is down only 1 lb from that of his previous clinic wt in mid 07/2020.  BP today 132/60 with previous BP 130/70.  He denies any SOB at rest but is still short of breath with climbing 15 steps. No presyncope or syncope or tachypalpitations. Improved LEE reported by both pt and viewed on exam. No report of abdominal distention, though some noted on exam. No PND or orthopnea. Lower extremity arterial studies were discussed with findings reassuring, given no evidence of significant PAD. We  reviewed the recommendation for walking. He reports ongoing symptoms consistent with claudication in that they occur with walking short distances and alleviate with rest, despite his reassuring vascular studies. He typically notes that his claudication symptoms are worse going uphill, as compared with downhill.  He also notes improvement of symptoms if he leans against his grocery cart, rather than supporting his weight on his legs. He does have bilateral numbness in his feet. No noted color changes of LE.  No reported s/sx of bleeding. He reports medication compliance today, including Repatha.  Home Medications    Prior to Admission medications   Medication Sig Start Date End Date Taking? Authorizing Provider  aspirin EC 81 MG tablet Take 1 tablet (81 mg total) by mouth daily. 11/01/14  Yes Wellington Hampshire, MD  carvedilol (COREG) 3.125 MG tablet Take 1 tablet (3.125 mg total) by mouth 2 (two) times daily with a meal. 02/26/20  Yes Wellington Hampshire, MD  clopidogrel (PLAVIX) 75 MG tablet Take 1 tablet (75 mg total) by mouth daily. 11/02/19  Yes Wellington Hampshire, MD  Dulaglutide (TRULICITY) 3 0000000 SOPN Inject 3 mg into the skin once a week. 06/26/19  Yes Philemon Kingdom, MD  ezetimibe (ZETIA) 10 MG tablet Take 1 tablet (10 mg total) by mouth daily. PLEASE CALL OFFICE TO SCHEDULE APPOINTMENT FOR FURTHER REFILLS. 03/21/20  Yes Wellington Hampshire, MD  glucose blood (ONETOUCH ULTRA) test strip CHECK BLOOD SUGAR TWICE A DAY - DX E11.59 06/26/19  Yes Philemon Kingdom, MD  Injection Device (Electra) MISC Frequency:PHARMDIR   Dosage:0.0     Instructions:  Note:diagnosis 250.02 Dose: NA 09/28/08  Yes [provider]  insulin degludec (TRESIBA FLEXTOUCH) 200 UNIT/ML FlexTouch Pen Inject 44 Units into the skin daily. 02/25/20  Yes Philemon Kingdom, MD  Insulin Pen Needle 32G X 4 MM MISC Use 1x a day 06/26/19  Yes Philemon Kingdom, MD  INVOKANA 300 MG TABS tablet TAKE 1 TABLET BY  MOUTH ONCE A DAY WITH BREAKFAST 03/21/20  Yes Philemon Kingdom, MD  leuprolide (LUPRON) 30 MG injection Inject 45 mg into the muscle every 6 (six) months.   Yes [provider]  losartan (COZAAR) 25 MG tablet TAKE 1/2 TABLET BY MOUTH ONCE A DAY 03/22/20  Yes Theora Gianotti, NP  meclizine (ANTIVERT) 25 MG tablet Take 0.5-1 tablets (12.5-25 mg total) by mouth 2 (two) times daily as needed for dizziness or nausea. 01/26/19  Yes Ria Bush, MD  Mirabegron (MYRBETRIQ PO) Take by mouth daily.   Yes [provider]  nitroGLYCERIN (NITROSTAT) 0.4 MG SL tablet Place 1 tablet (0.4 mg total) under the tongue as needed. 09/30/19  Yes Loel Dubonnet, NP  REPATHA SURECLICK [id number] MG/ML SOAJ INJECT ONE PEN INTO THE SKIN EVERY 14 DAYS 12/10/19  Yes Wellington Hampshire, MD  tamsulosin (FLOMAX) 0.4 MG CAPS capsule Take 1 capsule (0.4 mg total) by mouth daily after supper. 02/22/20  Yes Chrystal, Eulas Post, MD    Review of Systems    He denies chest pain, palpitations, pnd, orthopnea, n, v, dizziness, syncope, or early satiety. Ongoing DOE with 15 stairs.  Improved SOB at rest. Ongoing but improved LEE. Wt gain noted after initial wt loss with lasix. Denies abdominal distention, though did note some on exam.  Ongoing bilateral calf pain with walking and numb feet. - all other systems reviewed and are otherwise negative except as noted above.  Physical Exam    VS:  BP 132/60 (BP Location: Left Arm, Patient Position: Sitting, Cuff Size: Normal)   Pulse 70   Ht 5\' 9"  (1.753 m)   Wt 250 lb (113.4 kg)   SpO2 98%   BMI 36.92 kg/m  , BMI Body mass index is 36.92 kg/m. GEN: Well nourished, well developed, in no acute distress. HEENT: normal. Neck: Supple, no JVD, carotid bruits, or masses. Cardiac: RRR, 2/6 systolic murmur RUBSB, rubs, or gallops. No clubbing, cyanosis, mild nonpitting bilateral edema.  Radials 2+ bilaterally. DP/PT 2+ and equal bilaterally.  Respiratory:  Respirations  regular and  unlabored, CTAB. GI: Firm, distended, BS + x 4. MS: no deformity or atrophy. Skin: warm and dry, no rash. Neuro:  Strength and sensation are intact. Psych: Normal affect.  Accessory Clinical Findings    ECG personally reviewed by me today - No EKG -no acute changes.  VITALS Reviewed today   Temp Readings from Last 3 Encounters:  06/24/20 97.6 F (36.4 C) (Tympanic)  05/27/20 (!) 96.8 F (36 C) (Tympanic)  05/27/20 (!) 96.8 F (36 C)   BP Readings from Last 3 Encounters:  08/19/20 132/60  07/28/20 130/70  07/01/20 128/78   Pulse Readings from Last 3 Encounters:  08/19/20 70  07/28/20 74  07/01/20 75    Wt Readings from Last 3 Encounters:  08/19/20 250 lb (113.4 kg)  07/28/20 251 lb (113.9 kg)  07/01/20 246 lb 3.2 oz (111.7 kg)     LABS  reviewed today   Lab Results  Component Value Date   WBC 5.6 06/24/2020   HGB 14.9 06/24/2020   HCT 42.4 06/24/2020   MCV 85.0 06/24/2020   PLT 174 06/24/2020   Lab Results  Component Value Date   CREATININE 0.88 07/28/2020   BUN 22 07/28/2020   NA 139 07/28/2020   K 4.7 07/28/2020   CL 102 07/28/2020   CO2 19 (L) 07/28/2020   Lab Results  Component Value Date   ALT 17 06/24/2020   AST 13 (L) 06/24/2020   ALKPHOS 64 06/24/2020   BILITOT 1.5 (H) 06/24/2020   Lab Results  Component Value Date   CHOL 166 10/09/2019   HDL 42 10/09/2019   LDLCALC 96 10/09/2019   LDLDIRECT 157.1 09/07/2013   TRIG 138 10/09/2019   CHOLHDL 4.0 10/09/2019    Lab Results  Component Value Date   HGBA1C 8.2 (A) 07/01/2020   Lab Results  Component Value Date   TSH 2.740 07/07/2014     STUDIES/PROCEDURES reviewed today   07/2020 LE US Arterial duplex Left: Atherosclerosis in the common femoral, femoral, and popliteal  arteries.  Patent superficial femoral artery, s/p angioplasty.  30-49% stenosis in the distal SFA/AK popliteal artery.   07/2020 Vas [place] ABI Summary:  Right: Resting right ankle-brachial index is  within normal range. No  evidence of significant right lower extremity arterial disease. The right  toe-brachial index is normal.  Left: Resting left ankle-brachial index is within normal range. No  evidence of significant left lower extremity arterial disease. The left  toe-brachial index is normal.   07/2020 MPI  Defect 1: There is a medium defect of severe severity present in the basal inferior, basal inferolateral, mid inferior and mid inferolateral location.  Findings consistent with prior myocardial infarction. No reversibility to suggest ischemia.  This is an intermediate risk study.  The left ventricular ejection fraction is mildly decreased (45-54%).  Echo 04/28/20 1. Left ventricular ejection fraction, by estimation, is 55 to 60%. The  left ventricle has normal function. The left ventricle has no regional  wall motion abnormalities. Left ventricular diastolic parameters were  normal. The average left ventricular  global longitudinal strain is -18.1 %. The global longitudinal strain is  normal.  2. Right ventricular systolic function is normal. The right ventricular  size is normal. There is normal pulmonary artery systolic pressure.  3. The mitral valve is normal in structure. No evidence of mitral valve  regurgitation. No evidence of mitral stenosis.  4. The aortic valve is tricuspid. Aortic valve regurgitation is not  visualized. Mild to moderate aortic valve  sclerosis/calcification is  present, without any evidence of aortic stenosis.  5. The inferior vena cava is normal in size with greater than 50%  respiratory variability, suggesting right atrial pressure of 3 mmHg.   06/2017 Vas [place] ABI Transcribed from scan: No evidence of significant right or left lower extremity arterial disease based on ABIs.  The bilateral toe brachial indices are abnormal.  No previous ankle-brachial indices available for comparison  2016 Cath as in scan under CV  procedures  Assessment & Plan   CAD s/p CABG x4  -- Improvement in breathing with start of diuresis. Improved DOE and resolved SOB at rest. S/p CABG x4 (06/2014) with repeat cath 2016 showing occlusions of the VG-Diag and PDA with an atretic LIMA to the LAD.  SVG-OM had 6% stenosis.  Multivessel CAD s/p atherectomy and DES placement x2 to the left main and proximal/mid LAD.  04/2020 echo as above with nl EF and NRWMA. Due to DOE progressing to SOB at rest, Lexiscan Myoview performed to assess if anginal equivalent. MPI ruled intermediate risk with severe defect consistent with previous MI and no reversibility to suggest ischemia. EF mildly reduced from that of his 04/2020 echo. Given his improved sx today, and after discussion today, further workup with R/LHC deferred at this time and will continue diuresis / medical management and risk factor modification. Recommend continue to monitor sx and call the office if breathing status again worsens or any new sx, reviewed today in detail.   Medications: Continue current ASA, Plavix, Coreg, Repatha, Zetia, losartan, and PRN SL nitro.   Lifestyle: Risk factor modification. Regular activity and heart healthy diet.   Future: Update LDL/ALT s/p 8 weeks compliance with Repatha (estimated ~10/2020). If new sx or return of previously reported SOB/DOE and euvolemic, recommend consider R/LHC.      AOC HFpEF --Improved DOE and resolved SOB at rest. Volume status is improved but still mildly volume up on exam. Pt reports initial wt loss with lasix, though this tapered off and still 14 lbs up on home scale. Clinic wt only down 1lb from last visit. Previous echo as above with nl EF, improved from 2015 EF 50-55%, NRWMA. MPI as above with slightly reduced EF from echo, though no reversibility in defect to suggest ischemia.   Labs: BMET, BNP.   Medications: If stable Cr/K, increase to lasix 40mg  daily. Discussed sliding scale lasix. If BP consistently over 130/80 at home or  RTC s/p improved volume status, increase ARB.  Lifestyle: Reviewed salt and fluid restrictions. Reviewed monitoring wt and BP at home.    Future: If abdominal distention at RTC, consider transition from lasix to torsemide. Increase ARB dose at RTC if BP still elevated and euvolemic, and if Cr/K allows. As above, R/LHC deferred given improved sx.    Essential hypertension, goal BP <130/80 --BP borderline. As previously noted, suspect improvement in BP with volume status. Given still volume up on exam, continue current medications. Lasix dose to be determined pending labs today. If BP remains consistently above 130/80 at home or at RTC and euvolemic on exam, increase to losartan 25mg  daily. Reviewed salt and fluid restrictions. Encourage home monitoring. Reviewed goal BP 130/80 or lower.   PAD -Reports ongoing bilateral lower extremity claudication with 10 minutes walking.  S/p left SFA angioplasty.  ABIs wnl. 07/2020 LE duplex with patent superficial femoral artery s/p angioplasty and atherosclerosis in the common femoral, femoral, and popliteal arteries. 30-49% stenosis in the distal SFA/AK popliteal artery noted. ABIs wnl.  Bilateral pulses today 1+ bilaterally. Discussed recommendation for walking routine. Glucose and LDL control recommended.  Continue ASA, Plavix, lipid management. Discussed that he should call the office if any worsening sx of claudication or sx start to occur all the time, including at rest.   Hyperlipidemia, goal LDL <70 Statin Intolerance -Statin intolerant.  He continues Repatha once every 2 weeks. Now that he is consistently taking Repatha, repeat lipid and liver function in 8 weeks (~10/2020).   DM2 --Continue to follow with endocrinology. Optimal glycemic control recommended for risk factor modification.   OSA -Continue CPAP.  BMI 30.0-35.0 --Ongoing lifestyle changes encouraged.  Medication changes: Pending BMET, possible increase to lasix for ongoing elevated  volume status (dependent on renal function, potassium). For now, sliding scale lasix.  Labs ordered: BMET, BNP today. LDL/ALT in 2 months. Studies / Imaging ordered: None. Future considerations: Increase of lasix dose to be based on today's labs. Increase Losartan if still room in BP after euvolemic + Cr/K allows. Repeats lipids/LFTs once consistently taking Repatha for 2 months (estimated ~10/2020). If ongoing abdominal distention at RTC, transition from lasix to torsemide.  Disposition: RTC 6 months or sooner if worsening sx of breathing, claudication, or any new CP or concerning sx as reviewed today.   Arvil Chaco, PA-C 08/19/2020

## 2020-08-19 NOTE — Patient Instructions (Addendum)
Medication Instructions:   Your physician recommends that you continue on your current medications as directed. Please refer to the Current Medication list given to you today.  See Lasix instructions on second page.   *If you need a refill on your cardiac medications before your next appointment, please call your pharmacy*   Lab Work: Your physician recommends that you have lab work TODAY: Bmet, BNP  If you have labs (blood work) drawn today and your tests are completely normal, you will receive your results only by: Marland Kitchen MyChart Message (if you have MyChart) OR . A paper copy in the mail If you have any lab test that is abnormal or we need to change your treatment, we will call you to review the results.   Testing/Procedures: None ordered   Follow-Up: At [facility], you and your health needs are our priority.  As part of our continuing mission to provide you with exceptional heart care, we have created designated Provider Care Teams.  These Care Teams include your primary Cardiologist (physician) and Advanced Practice Providers (APPs -  Physician Assistants and Nurse Practitioners) who all work together to provide you with the care you need, when you need it.  We recommend signing up for the patient portal called "MyChart".  Sign up information is provided on this After Visit Summary.  MyChart is used to connect with patients for Virtual Visits (Telemedicine).  Patients are able to view lab/test results, encounter notes, upcoming appointments, etc.  Non-urgent messages can be sent to your provider as well.   To learn more about what you can do with MyChart, go to [email or web address].    Your next appointment:   6 month(s)  The format for your next appointment:   In Person  Provider:   You may see [name], MD or one of the following Advanced Practice Providers on your designated Care Team:     Marrianne Mood, PA-C     Other Instructions  Sliding scale Lasix:   Your dry weight will be what your scale says on the day you feel your best (no symptoms of holding on to fluid).  Weight yourself daily and in the morning. Log these weights.  If you gain (1) more than 3 pounds from dry weight in 1 day or (2) 5 lbs in 1 week from dry weight, take one additional lasix pill daily until weight returns to baseline dry weight +/- shortness of breath improves +/-  swelling improves of legs or belly.  Call the office if taking extra lasix daily for 1 week, so that we may reassess your status and check blood work to ensure proper kidney and electrolyte function.  Vascular studies: Call the office if worsening symptoms of pain when walking or if pain at rest.

## 2020-08-20 LAB — BASIC METABOLIC PANEL
BUN/Creatinine Ratio: 24 (ref 10–24)
BUN: 23 mg/dL (ref 8–27)
CO2: 20 mmol/L (ref 20–29)
Calcium: 8.9 mg/dL (ref 8.6–10.2)
Chloride: 105 mmol/L (ref 96–106)
Creatinine, Ser: 0.95 mg/dL (ref 0.76–1.27)
GFR calc Af Amer: 94 mL/min/{1.73_m2} (ref >59)
GFR calc non Af Amer: 81 mL/min/{1.73_m2} (ref >59)
Glucose: 236 mg/dL — ABNORMAL HIGH (ref 65–99)
Potassium: 4.6 mmol/L (ref 3.5–5.2)
Sodium: 142 mmol/L (ref 134–144)

## 2020-08-20 LAB — BRAIN NATRIURETIC PEPTIDE: BNP: 38 pg/mL (ref 0.0–100.0)

## 2020-08-22 ENCOUNTER — Telehealth: Payer: Self-pay | Admitting: *Deleted

## 2020-08-22 NOTE — Telephone Encounter (Signed)
-----   Message from Arvil Chaco, PA-C sent at 08/22/2020 12:32 PM EST ----- Please let [name] know that his labs showed  --renal function stable  --electrolytes are stable --glucose high at 236  Recommendations:  --continue current dose of lasix but on a sliding scale as outlined on AVS --daily wts, BP --take an extra tablet of lasix for 3lbs overnight or 5 lbs in a week --call the office if home BP consistently over 130/80 --call the office if increased shortness of breath or swelling noted  He should follow-up with his PCP regarding his glucose  We will plan to update lipid and liver function at his next visit, since he is not consistently taking Repatha.

## 2020-08-22 NOTE — Telephone Encounter (Signed)
Attempted to call pt to review results. No answer. Lmtcb.  

## 2020-08-23 ENCOUNTER — Other Ambulatory Visit: Payer: Self-pay | Admitting: Internal Medicine

## 2020-08-24 NOTE — Telephone Encounter (Signed)
The patient has been notified of the result and verbalized understanding.  All questions (if any) were answered. Darlyne Russian, RN 08/24/2020 1:45 PM

## 2020-08-26 ENCOUNTER — Inpatient Hospital Stay: Payer: Medicare Other

## 2020-08-26 ENCOUNTER — Inpatient Hospital Stay: Payer: Medicare Other | Attending: Internal Medicine

## 2020-08-26 ENCOUNTER — Inpatient Hospital Stay (HOSPITAL_BASED_OUTPATIENT_CLINIC_OR_DEPARTMENT_OTHER): Payer: Medicare Other | Admitting: Internal Medicine

## 2020-08-26 DIAGNOSIS — C7951 Secondary malignant neoplasm of bone: Secondary | ICD-10-CM | POA: Diagnosis not present

## 2020-08-26 DIAGNOSIS — I2510 Atherosclerotic heart disease of native coronary artery without angina pectoris: Secondary | ICD-10-CM

## 2020-08-26 DIAGNOSIS — R197 Diarrhea, unspecified: Secondary | ICD-10-CM | POA: Diagnosis not present

## 2020-08-26 DIAGNOSIS — C61 Malignant neoplasm of prostate: Secondary | ICD-10-CM | POA: Insufficient documentation

## 2020-08-26 LAB — COMPREHENSIVE METABOLIC PANEL
ALT: 17 U/L (ref 0–44)
AST: 15 U/L (ref 15–41)
Albumin: 3.9 g/dL (ref 3.5–5.0)
Alkaline Phosphatase: 64 U/L (ref 38–126)
Anion gap: 3 — ABNORMAL LOW (ref 5–15)
BUN: 22 mg/dL (ref 8–23)
CO2: 31 mmol/L (ref 22–32)
Calcium: 8.6 mg/dL — ABNORMAL LOW (ref 8.9–10.3)
Chloride: 103 mmol/L (ref 98–111)
Creatinine, Ser: 0.87 mg/dL (ref 0.61–1.24)
GFR, Estimated: >60 mL/min (ref >60)
Glucose, Bld: 239 mg/dL — ABNORMAL HIGH (ref 70–99)
Potassium: 4.4 mmol/L (ref 3.5–5.1)
Sodium: 137 mmol/L (ref 135–145)
Total Bilirubin: 0.7 mg/dL (ref 0.3–1.2)
Total Protein: 6.7 g/dL (ref 6.5–8.1)

## 2020-08-26 LAB — CBC WITH DIFFERENTIAL/PLATELET
Abs Immature Granulocytes: 0.02 10*3/uL (ref 0.00–0.07)
Basophils Absolute: 0 10*3/uL (ref 0.0–0.1)
Basophils Relative: 1 %
Eosinophils Absolute: 0.1 10*3/uL (ref 0.0–0.5)
Eosinophils Relative: 2 %
HCT: 43.2 % (ref 39.0–52.0)
Hemoglobin: 15.3 g/dL (ref 13.0–17.0)
Immature Granulocytes: 0 %
Lymphocytes Relative: 25 %
Lymphs Abs: 1.4 10*3/uL (ref 0.7–4.0)
MCH: 30.4 pg (ref 26.0–34.0)
MCHC: 35.4 g/dL (ref 30.0–36.0)
MCV: 85.7 fL (ref 80.0–100.0)
Monocytes Absolute: 0.4 10*3/uL (ref 0.1–1.0)
Monocytes Relative: 8 %
Neutro Abs: 3.4 10*3/uL (ref 1.7–7.7)
Neutrophils Relative %: 64 %
Platelets: 184 10*3/uL (ref 150–400)
RBC: 5.04 MIL/uL (ref 4.22–5.81)
RDW: 12.7 % (ref 11.5–15.5)
WBC: 5.4 10*3/uL (ref 4.0–10.5)
nRBC: 0 % (ref 0.0–0.2)

## 2020-08-26 LAB — PSA: Prostatic Specific Antigen: 0.03 ng/mL (ref 0.00–4.00)

## 2020-08-26 NOTE — Assessment & Plan Note (Addendum)
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 S/P- EBRT to prostate [last Tx- on 01/08/2020]. PET Oct 10th, 2021-Mild asymmetric activity within the prostate gland decreased from comparison from Feb 2021; No convincing evidence of nodal metastasis. No visceral metastasis or skeletal metastasis.  October 2021 PSA 0.16/improving.  January 2022-PSA pending  #Patient STOPPED  Darolutamide 300 mg [sec to Poor tolerance]; ok to continue of without any additional therapies at this time; monitor PSA closely. Continue Eligard [every 6 months; 11/22; Dr. Stoioff].  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 Recommend ca+Vit D 1000 mg/day. Recommend dental clearance.  Patient awaiting dental evaluation February 2022.  # DISPOSITION:  # HOLD zometa today. #  Follow up in 3 months; MD- labs- cbc/cmp/PSA; possible zometa- Dr.B

## 2020-08-26 NOTE — Patient Instructions (Signed)
#   dental clearance for zometa infusion # take ca 1000mg +vit D 1000/day

## 2020-08-26 NOTE — Progress Notes (Signed)
[place] CONSULT NOTE  Patient Care Team: Tonia Ghent, MD as PCP - General (Family Medicine) Wellington Hampshire, MD as PCP - Cardiology (Cardiology)  CHIEF COMPLAINTS/PURPOSE OF CONSULTATION: Prostate cancer  #  Oncology History Overview Note  1. Prostate adenocarcinoma  a. 02/2006 PSA returned elevated at 17.49  b. 04/03/2006; PSA 24.7; prostate biopsy Gleason 3+3 with 4/4 cores c. tx with HIFU overseas late 2007 in [place] [Dr.Cope]] d. persistent PSA elevation over period 2007-2010; nadir 2.1 09/16/06, peak 15.0 ng/mL  e. 04/2006 bone scan and CT abd/pelvis without evidence of metastasis f. 03/08/2007 CT abdomen/pelvis with no definitive metastatic disease noted  g. 03/18/2007 prostascint scan with no evidence of disease but small pelvic lymph nodes noted  h. 08/15/2009 prostascint scan with no evidence of local prostate cancer or distant metastases  i. 08/22/2009 PSA 15 ng/mL; 02/06/2010 PSA 20.5 ng/mL  j. 03/08/2010 PSA 20.9 ng/mL  k. 06/02/10 initiation of degarelix  l. 05/25/10 PET CT (Fluoride) revealed heterogenous areas of FDG-avidity in ribs, femurs, humeri, areas also in the metatarsals noted which may have been arthiritic in nature  m. 07/03/10 PSA 9.6 ng/mL  o.  Started Lupron/Casodex; June 2020-PSA from 0.2 to 0.3 -I;  As per patient-Casodex was withdrawn-but noted to have a jump of PSA to 0.9. FEB 2021-evidence of prostatic malignancy in the residual prostate bed; no distant metastatic disease   # FEB 2021-PET scan uptake in prostate; no bone mets.-EBRT to prostate [finished end of May 2021]; October 2021 PET scan improved prostate uptake; no evidence metastatic disease.  # EARLY NOV-apalutamide 3pills [pt pref]; STOPPED prior to christmas, 2021 [fatigue/?diarrhea]  #comorbidities-CAD s/p CABG; diabetes; PVD  # Prostate cancer-at age of 29 s/p-genetic counseling-NEG.   # NGS/MOLECULAR TESTS:NA  # PALLIATIVE CARE EVALUATION: NA  # PAIN MANAGEMENT:  NA   DIAGNOSIS: [name]  STAGE:    4     ;  GOALS: Palliative/control  CURRENT/MOST RECENT THERAPY :     Prostate cancer ([place])  09/16/2007 Initial Diagnosis   [name]   01/22/2020 Genetic Testing   Negative genetic testing:  No pathogenic variants detected on the [facility] Multi-Cancer panel. The report date is 01/22/2020.   The Common Hereditary Cancers Panel offered by [facility] includes sequencing and/or deletion duplication testing of the following 48 genes: APC, ATM, AXIN2, BARD1, BMPR1A, BRCA1, BRCA2, BRIP1, CDH1, CDK4, CDKN2A (p14ARF), CDKN2A (p16INK4a), CHEK2, CTNNA1, DICER1, EPCAM (Deletion/duplication testing only), GREM1 (promoter region deletion/duplication testing only), KIT, MEN1, MLH1, MSH2, MSH3, MSH6, MUTYH, NBN, NF1, NTHL1, PALB2, PDGFRA, PMS2, POLD1, POLE, PTEN, RAD50, RAD51C, RAD51D, RNF43, SDHB, SDHC, SDHD, SMAD4, SMARCA4. STK11, TP53, TSC1, TSC2, and VHL.  The following genes were evaluated for sequence changes only: SDHA and HOXB13 c.251G>A variant only.     HISTORY OF PRESENTING ILLNESS:  Phillip Clements 70 y.o.  male with history of castrate resistant prostate cancer with Hx of  metastatic to bone/Lupron-is here for follow-up.  Patient stopped taking Darolutamide around Christmas 2021.  Patient was on it for about 3 to 4 weeks prior.  Patient stopped taking the pills because of extreme fatigue/diarrhea.  He did not inform us.  Otherwise no worsening shortness of breath or cough.  No chills.  Diarrhea improved.  Fatigue improved.   Patient awaiting dental extractions February 2022.  Review of Systems  Constitutional: Positive for malaise/fatigue. Negative for chills, diaphoresis, fever and weight loss.  HENT: Negative for nosebleeds and sore throat.  Eyes: Negative for double vision.  Respiratory: Negative for cough, hemoptysis, sputum production, shortness of breath and wheezing.   Cardiovascular: Negative for  chest pain, palpitations, orthopnea and leg swelling.  Gastrointestinal: Negative for abdominal pain, blood in stool, constipation, diarrhea, heartburn, melena, nausea and vomiting.  Genitourinary: Positive for frequency and urgency.  Musculoskeletal: Positive for back pain. Negative for joint pain.  Skin: Negative.  Negative for itching and rash.  Neurological: Negative for dizziness, tingling, focal weakness, weakness and headaches.  Endo/Heme/Allergies: Does not bruise/bleed easily.  Psychiatric/Behavioral: Negative for depression. The patient is not nervous/anxious and does not have insomnia.      MEDICAL HISTORY:  Past Medical History:  Diagnosis Date  . Cataract    bilateral eye in 2010  . Coronary artery disease    a. CABG 04/2014: (LIMA->LAD, VG->DIAG, VG->OM2, VG->PDA)  b. 07/2014 early graft failure (VG->OM2 60, LIMA->LAD atretic, VG->PDA & VG->D1 100). s/p successful rotational atherectomy & DES of the LM & pLAD (3.0x24 Promus DES) & mLAD (2.25x24 Promus DES).  . CVA (cerebral infarction)   . Family history of prostate cancer   . Hx of adenomatous colonic polyps 06/17/2017  . Hyperlipidemia    a. Statin intolerant.  . Hypertension 2000  . Moderate mitral regurgitation    a. 02/2015 Echo: EF 55-60%, no rwma, mod MR, mod BAE, mildly dil RV w/ low nl RV fxn.  . OSA on CPAP   . Peripheral vascular disease ([place])    a. left SFA angioplasty in December 2016  . Prostate cancer ([place])   . Sleep apnea    wears CPAP nightly  . Type II diabetes mellitus ([place])     SURGICAL HISTORY: Past Surgical History:  Procedure Laterality Date  . ANTERIOR CERVICAL DECOMP/DISCECTOMY FUSION  08/25/2012   Procedure: ANTERIOR CERVICAL DECOMPRESSION/DISCECTOMY FUSION 2 LEVELS;  Surgeon: Hosie Spangle, MD;  Location: [facility];  Service: Neurosurgery;  Laterality: N/A;  Cervical five-six Cervical six-seven anterior cervical decompression with fusion and plating and bonegraft  . BACK SURGERY    .  CARDIAC CATHETERIZATION  04/2014; 07/19/2014  . CORONARY ARTERY BYPASS GRAFT N/A 04/26/2014   Procedure: CORONARY ARTERY BYPASS GRAFTING (CABG), on pump, times four, using left internal mammary artery, right greater saphenous vein harvested endoscopically.;  Surgeon: Ivin Poot, MD;  Location: [facility];  Service: Open Heart Surgery;  Laterality: N/A;  LIMA to LAD, SVG to DIAGONAL, SVG to OM1, SVG to PDA) with EVH of the RIGHT THIGH and LOWER EXTREMITY SAPHENOUS VEIN  . Cytoscopy prostatic stone o/w nml  06/21/08   Dr. Jacqlyn Larsen  . ETT myoview  09/13/09   Low risk EF 49%  . High intense focused ultrasound  07/20/06   By Dr. Jacqlyn Larsen  . INTRAOPERATIVE TRANSESOPHAGEAL ECHOCARDIOGRAM N/A 04/26/2014   Procedure: INTRAOPERATIVE TRANSESOPHAGEAL ECHOCARDIOGRAM;  Surgeon: Ivin Poot, MD;  Location: [facility];  Service: Open Heart Surgery;  Laterality: N/A;  . PERCUTANEOUS CORONARY ROTOBLATOR INTERVENTION (PCI-R) N/A 07/22/2014   Procedure: PERCUTANEOUS CORONARY ROTOBLATOR INTERVENTION (PCI-R);  Surgeon: Peter M Martinique, MD;  Location: [facility];  Service: Cardiovascular;  Laterality: N/A;  . PERIPHERAL VASCULAR CATHETERIZATION Left 07/26/2015   Procedure: Lower Extremity Angiography;  Surgeon: Katha Cabal, MD;  Location: [facility];  Service: Cardiovascular;  Laterality: Left;  . PERIPHERAL VASCULAR CATHETERIZATION  07/26/2015   Procedure: Lower Extremity Intervention;  Surgeon: Katha Cabal, MD;  Location: [facility];  Service: Cardiovascular;;  . PROSTATE BIOPSY  04/03/06 &  02/25/07    SOCIAL HISTORY: Social History   Socioeconomic History  . Marital status: Married    Spouse name: Not on file  . Number of children: 3  . Years of education: Not on file  . Highest education level: Not on file  Occupational History  . Occupation: Insurance account manager  Tobacco Use  . Smoking status: Former Smoker    Packs/day: 1.00    Years: 36.00    Pack years:  36.00    Types: Cigarettes    Quit date: 03/01/2014    Years since quitting: 6.4  . Smokeless tobacco: Never Used  Vaping Use  . Vaping Use: Never used  Substance and Sexual Activity  . Alcohol use: Yes    Comment: 07/19/2014 "might have a drink a couple times/yr"  . Drug use: No  . Sexual activity: Not Currently  Other Topics Concern  . 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 In [place].    Social Determinants of Health   Financial Resource Strain: Not on file  Food Insecurity: Not on file  Transportation Needs: Not on file  Physical Activity: Not on file  Stress: Not on file  Social Connections: Not on file  Intimate Partner Violence: Not on file    FAMILY HISTORY: Family History  Problem Relation Age of Onset  . Diabetes Mother 13       DM  . Coronary artery disease Mother   . Hypertension Mother   . Heart attack Mother        CABG with MI in 2005  . Heart disease Mother        CV  . Hypertension Father   . Heart attack Father        CABG with Mi around 1997  . Coronary artery disease Father   . Heart disease Father        CV  . Diabetes Father   . Heart attack Brother        MI/PTCA  . Heart disease Brother        CV  . Diabetes Brother   . Heart attack Maternal Grandfather        MI  . Prostate cancer Paternal Grandfather 53       likely metastatic  . Diabetes Sister        DM  . Breast cancer Neg Hx        Breast/ovarian/uterine cancer  . Colon cancer Neg Hx   . Depression Neg Hx   . Alcohol abuse Neg Hx        ETOH/drug abuse  . Stroke Neg Hx     ALLERGIES:  is allergic to coreg [carvedilol], metformin and related, protonix [pantoprazole sodium], rosuvastatin calcium, and statins.  MEDICATIONS:  Current Outpatient Medications  Medication Sig  Dispense Refill  . aspirin EC 81 MG tablet Take 1 tablet (81 mg total) by mouth daily. 90 tablet 3  . carvedilol (COREG) 3.125 MG tablet TAKE 1 TABLET BY MOUTH TWICE A DAY WITH A MEAL 180 tablet 0  . clopidogrel (PLAVIX) 75 MG tablet TAKE 1 TABLET BY MOUTH ONCE DAILY 30 tablet 2  . ezetimibe (ZETIA) 10 MG tablet Take 1 tablet (10 mg total) by mouth daily. 30 tablet 1  . furosemide (LASIX) 20 MG tablet Take 1  tablet (20 mg total) by mouth daily. 90 tablet 1  . glucose blood (ONETOUCH ULTRA) test strip CHECK BLOOD SUGAR TWICE A DAY - DX E11.59 200 strip 3  . Injection Device (CORNWALL METAL PIPETTING) MISC Frequency:PHARMDIR   Dosage:0.0     Instructions:  Note:diagnosis 250.02 Dose: NA    . insulin aspart (NOVOLOG FLEXPEN) 100 UNIT/ML FlexPen Inject 10-16 Units into the skin daily before supper. 15 mL 11  . Insulin Pen Needle 32G X 4 MM MISC Use 1x a day 100 each 3  . INVOKANA 300 MG TABS tablet TAKE 1 TABLET BY MOUTH ONCE A DAY WITH BREAKFAST 90 tablet 3  . leuprolide (LUPRON) 30 MG injection Inject 45 mg into the muscle every 6 (six) months.    Marland Kitchen losartan (COZAAR) 25 MG tablet TAKE 1/2 TABLET BY MOUTH ONCE A DAY 45 tablet 1  . meclizine (ANTIVERT) 25 MG tablet Take 0.5-1 tablets (12.5-25 mg total) by mouth 2 (two) times daily as needed for dizziness or nausea. 30 tablet 0  . mirabegron ER (MYRBETRIQ) 50 MG TB24 tablet Take 1 tablet (50 mg total) by mouth daily. 30 tablet 12  . nitroGLYCERIN (NITROSTAT) 0.4 MG SL tablet Place 1 tablet (0.4 mg total) under the tongue as needed. 25 tablet 2  . REPATHA SURECLICK 967 MG/ML SOAJ INJECT ONE PEN INTO THE SKIN EVERY 14 DAYS 2 mL 3  . tamsulosin (FLOMAX) 0.4 MG CAPS capsule TAKE 1 CAPSULE BY MOUTH ONCE DAILY AFTERSUPPER 30 capsule 11  . TRESIBA FLEXTOUCH 200 UNIT/ML FlexTouch Pen INJECT 44 UNITS INTO THE SKIN DAILY 9 mL 2  . TRULICITY 3 RF/1.6BW SOPN INJECT 3 MG INTO THE SKIN ONCE A WEEK 6 mL 2   No current facility-administered medications for this  visit.      Marland Kitchen  PHYSICAL EXAMINATION: ECOG PERFORMANCE STATUS: 0 - Asymptomatic  Vitals:   08/26/20 0951  BP: (!) 141/66  Pulse: 68  Resp: 18  Temp: 97.8 F (36.6 C)  SpO2: 99%   Filed Weights   08/26/20 0951  Weight: 242 lb (109.8 kg)    Physical Exam HENT:     Head: Normocephalic and atraumatic.     Mouth/Throat:     Pharynx: No oropharyngeal exudate.  Eyes:     Pupils: Pupils are equal, round, and reactive to light.  Cardiovascular:     Rate and Rhythm: Normal rate and regular rhythm.  Pulmonary:     Effort: Pulmonary effort is normal. No respiratory distress.     Breath sounds: Normal breath sounds. No wheezing.  Abdominal:     General: Bowel sounds are normal. There is no distension.     Palpations: Abdomen is soft. There is no mass.     Tenderness: There is no abdominal tenderness. There is no guarding or rebound.  Musculoskeletal:        General: No tenderness. Normal range of motion.     Cervical back: Normal range of motion and neck supple.  Skin:    General: Skin is warm.  Neurological:     Mental Status: He is alert and oriented to person, place, and time.  Psychiatric:        Mood and Affect: Affect normal.     LABORATORY DATA:  I have reviewed the data as listed Lab Results  Component Value Date   WBC 5.4 08/26/2020   HGB 15.3 08/26/2020   HCT 43.2 08/26/2020   MCV 85.7 08/26/2020   PLT 184 08/26/2020   Recent  Labs    03/17/20 1012 06/24/20 1035 07/28/20 0922 08/19/20 0939 08/26/20 0923  NA 135 136 139 142 137  K 4.0 4.6 4.7 4.6 4.4  CL 102 103 102 105 103  CO2 24 26 19* 20 31  GLUCOSE 157* 200* 290* 236* 239*  BUN 17 26* 22 23 22   CREATININE 0.69 0.91 0.88 0.95 0.87  CALCIUM 8.6* 8.8* 9.0 8.9 8.6*  GFRNONAA >60 >60 88 81 >60  GFRAA >60  --  101 94  --   PROT 6.5 6.7  --   --  6.7  ALBUMIN 3.9 3.9  --   --  3.9  AST 14* 13*  --   --  15  ALT 18 17  --   --  17  ALKPHOS 65 64  --   --  64  BILITOT 0.7 1.5*  --   --  0.7     RADIOGRAPHIC STUDIES: I have personally reviewed the radiological images as listed and agreed with the findings in the report. NM Myocar Multi W/Spect W/Wall Motion / EF  Result Date: 08/02/2020  Defect 1: There is a medium defect of severe severity present in the basal inferior, basal inferolateral, mid inferior and mid inferolateral location.  Findings consistent with prior myocardial infarction. No reversibility to suggest ischemia.  This is an intermediate risk study.  The left ventricular ejection fraction is mildly decreased (45-54%).    VAS [place] ABI WITH/WO TBI  Result Date: 08/08/2020 LOWER EXTREMITY DOPPLER STUDY Indications: Peripheral artery disease. High Risk Factors: Hypertension, hyperlipidemia, past history of smoking,                    coronary artery disease, prior CVA. Other Factors: Patient denies any claudication and no rest pain.  Vascular Interventions: 07/26/2015-PTA of the left SFA done by Dr Delana Meyer. Comparison Study: In 06/26/2017, an arterial Doppler showed a right ABI of 0.97                   and 1.03 on the left Performing Technologist: Wilkie Aye RVT  Examination Guidelines: A complete evaluation includes at minimum, Doppler waveform signals and systolic blood pressure reading at the level of bilateral brachial, anterior tibial, and posterior tibial arteries, when vessel segments are accessible. Bilateral testing is considered an integral part of a complete examination. Photoelectric Plethysmograph (PPG) waveforms and toe systolic pressure readings are included as required and additional duplex testing as needed. Limited examinations for reoccurring indications may be performed as noted.  ABI Findings: +---------+------------------+-----+---------+--------+ Right    Rt Pressure (mmHg)IndexWaveform Comment  +---------+------------------+-----+---------+--------+ Brachial 135                                       +---------+------------------+-----+---------+--------+ ATA      144               1.05 triphasic         +---------+------------------+-----+---------+--------+ PTA      164               1.20 triphasic         +---------+------------------+-----+---------+--------+ PERO     158               1.15 biphasic          +---------+------------------+-----+---------+--------+ Great Toe100               0.73  Normal            +---------+------------------+-----+---------+--------+ +---------+------------------+-----+---------+-------+ Left     Lt Pressure (mmHg)IndexWaveform Comment +---------+------------------+-----+---------+-------+ Brachial 137                                     +---------+------------------+-----+---------+-------+ ATA      146               1.07 triphasic        +---------+------------------+-----+---------+-------+ PTA      171               1.25 triphasic        +---------+------------------+-----+---------+-------+ PERO     144               1.05 biphasic         +---------+------------------+-----+---------+-------+ [place]               0.85                  +---------+------------------+-----+---------+-------+ +-------+-----------+-----------+------------+------------+ ABI/TBIToday's ABIToday's TBIPrevious ABIPrevious TBI +-------+-----------+-----------+------------+------------+ Right  1.2        0.73       0.97        0.62         +-------+-----------+-----------+------------+------------+ Left   1.2        0.85       1.03        0.62         +-------+-----------+-----------+------------+------------+ Bilateral ABIs appear essentially unchanged compared to prior study on 06/2017.  Summary: Right: Resting right ankle-brachial index is within normal range. No evidence of significant right lower extremity arterial disease. The right toe-brachial index is normal. Left: Resting left ankle-brachial index is  within normal range. No evidence of significant left lower extremity arterial disease. The left toe-brachial index is normal.  *See table(s) above for measurements and observations.  Electronically signed by Ida Rogue MD on 08/08/2020 at 5:34:14 PM.    Final    VAS [place] LOWER EXTREMITY ARTERIAL DUPLEX  Result Date: 08/08/2020 LOWER EXTREMITY ARTERIAL DUPLEX STUDY Indications: Peripheral artery disease. High Risk Factors: Hypertension, hyperlipidemia, past history of smoking,                    coronary artery disease, prior CVA. Other Factors: Patient denies any claudication and no rest pain.  Vascular Interventions: 07/26/2015-PTA of the left SFA done by Dr Delana Meyer. Current ABI:            Bilateral ABI's were 1.2 Performing Technologist: Wilkie Aye RVT  Examination Guidelines: A complete evaluation includes B-mode imaging, spectral Doppler, color Doppler, and power Doppler as needed of all accessible portions of each vessel. Bilateral testing is considered an integral part of a complete examination. Limited examinations for reoccurring indications may be performed as noted.  +----------+--------+-----+---------------+---------+--------+ LEFT      PSV cm/sRatioStenosis       Waveform Comments +----------+--------+-----+---------------+---------+--------+ CFA Prox  142                         triphasic         +----------+--------+-----+---------------+---------+--------+ DFA       128                         triphasic         +----------+--------+-----+---------------+---------+--------+ SFA  Prox  141                         biphasic          +----------+--------+-----+---------------+---------+--------+ SFA Mid   110                         biphasic          +----------+--------+-----+---------------+---------+--------+ SFA Distal177          30-49% stenosisbiphasic          +----------+--------+-----+---------------+---------+--------+ POP Prox  89                           biphasic          +----------+--------+-----+---------------+---------+--------+ POP Distal51                          biphasic          +----------+--------+-----+---------------+---------+--------+ TP Trunk  102                         biphasic          +----------+--------+-----+---------------+---------+--------+ A focal velocity elevation of 177 cm/s was obtained at distal SFA with a VR of 2.2. Findings are characteristic of 30-49% stenosis.  Summary: Left: Atherosclerosis in the common femoral, femoral, and popliteal arteries. Patent superficial femoral artery, s/p angioplasty. 30-49% stenosis in the distal SFA/AK popliteal artery.  See table(s) above for measurements and observations. Electronically signed by Ida Rogue MD on 08/08/2020 at 5:30:26 PM.    Final     ASSESSMENT & PLAN:   Prostate cancer ([place]) # STAGE-IV-Prostate cancer-metastatic to bone/castrate resistant. S/P- EBRT to prostate [last Tx- on 01/08/2020]. PET Oct 10th, 2021-Mild asymmetric activity within the prostate gland decreased from comparison from Feb 2021; No convincing evidence of nodal metastasis. No visceral metastasis or skeletal metastasis.  October 2021 PSA 0.16/improving.  January 2022-PSA pending  #Patient STOPPED  Darolutamide 300 mg [sec to Poor tolerance]; ok to continue of without any additional therapies at this time; monitor PSA closely. Continue Eligard [every 6 months; 11/22; Dr. Stoioff].  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 Recommend ca+Vit D 1000 mg/day. Recommend dental clearance.  Patient awaiting dental evaluation February 2022.  # DISPOSITION:  # HOLD  zometa today. #  Follow up in 3 months; MD- labs- cbc/cmp/PSA; possible zometa- Dr.B    All questions were answered. The patient knows to call the clinic with any problems, questions or concerns.    Cammie Sickle, MD 08/26/2020 10:46 AM

## 2020-08-29 ENCOUNTER — Encounter: Payer: Self-pay | Admitting: Internal Medicine

## 2020-09-06 ENCOUNTER — Other Ambulatory Visit: Payer: Self-pay | Admitting: Cardiovascular Disease

## 2020-09-26 ENCOUNTER — Ambulatory Visit: Payer: Self-pay | Admitting: Urology

## 2020-09-30 ENCOUNTER — Ambulatory Visit (INDEPENDENT_AMBULATORY_CARE_PROVIDER_SITE_OTHER): Payer: Medicare Other | Admitting: Urology

## 2020-09-30 ENCOUNTER — Encounter: Payer: Self-pay | Admitting: Urology

## 2020-09-30 ENCOUNTER — Other Ambulatory Visit: Payer: Self-pay

## 2020-09-30 VITALS — BP 118/68 | HR 74 | Ht 69.0 in | Wt 242.0 lb

## 2020-09-30 DIAGNOSIS — C61 Malignant neoplasm of prostate: Secondary | ICD-10-CM | POA: Diagnosis not present

## 2020-09-30 NOTE — Progress Notes (Signed)
09/30/2020 9:20 AM   Phillip Clements 12/07/1950 [id number]  Referring provider: Tonia Ghent, MD [address] [place],  [place] [address]  Chief Complaint  Patient presents with  . Prostate Cancer    Urologic history: 1. Prostate adenocarcinoma  a. 02/2006 PSA returned elevated at 17.49  b. 04/03/2006;PSA 24.7;prostate biopsy Gleason 3+3 with 4/4 cores c.tx[place]U [place] late 2007  d. persistent PSA elevation over period 2007-2010; nadir 2.1 09/16/06, peak 15.0 ng/mL  e. 04/2006 bone scan and CT abd/pelvis without evidence of metastasis f. 03/08/2007 CT abdomen/pelvis with no definitive metastatic disease noted  g. 03/18/2007 prostascint scan with no evidence of disease but small pelvic lymph nodes noted  h. 08/15/2009 prostascint scan with no evidence of local prostate cancer or distant metastases  i. 08/22/2009 PSA 15 ng/mL; 02/06/2010 PSA 20.5 ng/mL  j. 03/08/2010 PSA 20.9 ng/mL  k. 06/02/10 initiation of degarelix  l. 05/25/10 PET CT (Fluoride) revealed heterogenous areas of FDG-avidity in ribs, femurs, humeri, areas also in the metatarsals noted which may have been arthiritic in nature  m. 07/03/10 PSA 9.6 ng/mL  o.Started Lupron/Casodex p.  Axumin scan 09/2019 showed tracer avid soft tissue and prostate bed; no evidence of nodal or osseous metastasis q. completed salvage radiation 01/08/2020   HPI: 70 y.o. male presents for 4-month follow-up.   Last oncology visit with Dr. Rogue Bussing 08/26/2020  Darolutamide stopped December 2021 secondary to extreme fatigue/diarrhea  PSA at that visit 0.03  Last leuprolide 07/04/2020  No bothersome LUTS  Denies dysuria, gross hematuria  Denies flank, abdominal or pelvic pain  Zometa planned by oncology but awaiting dental evaluation   PMH: Past Medical History:  Diagnosis Date  . Cataract    bilateral eye in 2010  . Coronary artery disease    a. CABG 04/2014: (LIMA->LAD, VG->DIAG, VG->OM2, VG->PDA)  b.  07/2014 early graft failure (VG->OM2 60, LIMA->LAD atretic, VG->PDA & VG->D1 100). s/p successful rotational atherectomy & DES of the LM & pLAD (3.0x24 Promus DES) & mLAD (2.25x24 Promus DES).  . CVA (cerebral infarction)   . Family history of prostate cancer   . Hx of adenomatous colonic polyps 06/17/2017  . Hyperlipidemia    a. Statin intolerant.  . Hypertension 2000  . Moderate mitral regurgitation    a. 02/2015 Echo: EF 55-60%, no rwma, mod MR, mod BAE, mildly dil RV w/ low nl RV fxn.  . OSA on CPAP   . Peripheral vascular disease ([place])    a. left SFA angioplasty in December 2016  . Prostate cancer ([place])   . Sleep apnea    wears CPAP nightly  . Type II diabetes mellitus ([place])     Surgical History: Past Surgical History:  Procedure Laterality Date  . ANTERIOR CERVICAL DECOMP/DISCECTOMY FUSION  08/25/2012   Procedure: ANTERIOR CERVICAL DECOMPRESSION/DISCECTOMY FUSION 2 LEVELS;  Surgeon: Hosie Spangle, MD;  Location: [facility];  Service: Neurosurgery;  Laterality: N/A;  Cervical five-six Cervical six-seven anterior cervical decompression with fusion and plating and bonegraft  . BACK SURGERY    . CARDIAC CATHETERIZATION  04/2014; 07/19/2014  . CORONARY ARTERY BYPASS GRAFT N/A 04/26/2014   Procedure: CORONARY ARTERY BYPASS GRAFTING (CABG), on pump, times four, using left internal mammary artery, right greater saphenous vein harvested endoscopically.;  Surgeon: Ivin Poot, MD;  Location: [facility];  Service: Open Heart Surgery;  Laterality: N/A;  LIMA to LAD, SVG to DIAGONAL, SVG to OM1, SVG to PDA) with EVH of the RIGHT THIGH  and LOWER EXTREMITY SAPHENOUS VEIN  . Cytoscopy prostatic stone o/w nml  06/21/08   Dr. Jacqlyn Larsen  . ETT myoview  09/13/09   Low risk EF 49%  . High intense focused ultrasound  07/20/06   By Dr. Jacqlyn Larsen  . INTRAOPERATIVE TRANSESOPHAGEAL ECHOCARDIOGRAM N/A 04/26/2014   Procedure: INTRAOPERATIVE TRANSESOPHAGEAL ECHOCARDIOGRAM;  Surgeon: Ivin Poot, MD;  Location: [facility];   Service: Open Heart Surgery;  Laterality: N/A;  . PERCUTANEOUS CORONARY ROTOBLATOR INTERVENTION (PCI-R) N/A 07/22/2014   Procedure: PERCUTANEOUS CORONARY ROTOBLATOR INTERVENTION (PCI-R);  Surgeon: Peter M Martinique, MD;  Location: [facility];  Service: Cardiovascular;  Laterality: N/A;  . PERIPHERAL VASCULAR CATHETERIZATION Left 07/26/2015   Procedure: Lower Extremity Angiography;  Surgeon: Katha Cabal, MD;  Location: [facility] CV [facility];  Service: Cardiovascular;  Laterality: Left;  . PERIPHERAL VASCULAR CATHETERIZATION  07/26/2015   Procedure: Lower Extremity Intervention;  Surgeon: Katha Cabal, MD;  Location: [facility];  Service: Cardiovascular;;  . PROSTATE BIOPSY  04/03/06 & 02/25/07    Home Medications:  Allergies as of 09/30/2020      Reactions   Coreg [carvedilol] Other (See Comments)   Fatigue- unclear if this was due to coreg specifically or beta blockers in general.     Metformin And Related Nausea Only   Protonix [pantoprazole Sodium] Diarrhea   Rosuvastatin Calcium    REACTION: JOINT ACHES   Statins    REACTION: JOINT ACHES      Medication List       Accurate as of September 30, 2020  9:20 AM. If you have any questions, ask your nurse or doctor.        aspirin EC 81 MG tablet Take 1 tablet (81 mg total) by mouth daily.   carvedilol 3.125 MG tablet Commonly known as: COREG TAKE 1 TABLET BY MOUTH TWICE A DAY WITH A MEAL   clopidogrel 75 MG tablet Commonly known as: PLAVIX TAKE 1 TABLET BY MOUTH ONCE DAILY   [place] [employer] Pipetting Misc Frequency:PHARMDIR   Dosage:0.0     Instructions:  Note:diagnosis 250.02 Dose: NA   ezetimibe 10 MG tablet Commonly known as: ZETIA Take 1 tablet (10 mg total) by mouth daily.   furosemide 20 MG tablet Commonly known as: LASIX Take 1 tablet (20 mg total) by mouth daily.   Insulin Pen Needle 32G X 4 MM Misc Use 1x a day   Invokana 300 MG Tabs tablet Generic drug: canagliflozin TAKE 1 TABLET BY MOUTH  ONCE A DAY WITH BREAKFAST   leuprolide 30 MG injection Commonly known as: LUPRON Inject 45 mg into the muscle every 6 (six) months.   losartan 25 MG tablet Commonly known as: COZAAR TAKE 1/2 TABLET BY MOUTH ONCE A DAY   meclizine 25 MG tablet Commonly known as: ANTIVERT Take 0.5-1 tablets (12.5-25 mg total) by mouth 2 (two) times daily as needed for dizziness or nausea.   mirabegron ER 50 MG Tb24 tablet Commonly known as: MYRBETRIQ Take 1 tablet (50 mg total) by mouth daily.   nitroGLYCERIN 0.4 MG SL tablet Commonly known as: Nitrostat Place 1 tablet (0.4 mg total) under the tongue as needed.   NovoLOG FlexPen 100 UNIT/ML FlexPen Generic drug: insulin aspart Inject 10-16 Units into the skin daily before supper.   OneTouch Ultra test strip Generic drug: glucose blood CHECK BLOOD SUGAR TWICE A DAY - DX G38.75   Repatha SureClick 643 MG/ML Soaj Generic drug: Evolocumab INJECT ONE PEN INTO THE SKIN EVERY 14 DAYS  tamsulosin 0.4 MG Caps capsule Commonly known as: FLOMAX TAKE 1 CAPSULE BY MOUTH ONCE DAILY AFTERSUPPER   Tresiba FlexTouch 200 UNIT/ML FlexTouch Pen Generic drug: insulin degludec INJECT 44 UNITS INTO THE SKIN DAILY   Trulicity 3 SR/1.5XY Sopn Generic drug: Dulaglutide INJECT 3 MG INTO THE SKIN ONCE A WEEK       Allergies:  Allergies  Allergen Reactions  . Coreg [Carvedilol] Other (See Comments)    Fatigue- unclear if this was due to coreg specifically or beta blockers in general.    . Metformin And Related Nausea Only  . Protonix [Pantoprazole Sodium] Diarrhea  . Rosuvastatin Calcium     REACTION: JOINT ACHES  . Statins     REACTION: JOINT ACHES    Family History: Family History  Problem Relation Age of Onset  . Diabetes Mother 53       DM  . Coronary artery disease Mother   . Hypertension Mother   . Heart attack Mother        CABG with MI in 2005  . Heart disease Mother        CV  . Hypertension Father   . Heart attack Father         CABG with Mi around 1997  . Coronary artery disease Father   . Heart disease Father        CV  . Diabetes Father   . Heart attack Brother        MI/PTCA  . Heart disease Brother        CV  . Diabetes Brother   . Heart attack Maternal Grandfather        MI  . Prostate cancer Paternal Grandfather 26       likely metastatic  . Diabetes Sister        DM  . Breast cancer Neg Hx        Breast/ovarian/uterine cancer  . Colon cancer Neg Hx   . Depression Neg Hx   . Alcohol abuse Neg Hx        ETOH/drug abuse  . Stroke Neg Hx     Social History:  reports that he quit smoking about 6 years ago. His smoking use included cigarettes. He has a 36.00 pack-year smoking history. He has never used smokeless tobacco. He reports current alcohol use. He reports that he does not use drugs.   Physical Exam: BP 118/68   Pulse 74   Ht 5\' 9"  (1.753 m)   Wt 242 lb (109.8 kg)   BMI 35.74 kg/m   Constitutional:  Alert and oriented, No acute distress. HEENT: [place] AT, moist mucus membranes.  Trachea midline, no masses. Cardiovascular: No clubbing, cyanosis, or edema. Respiratory: Normal respiratory effort, no increased work of breathing.   Assessment & Plan:    1.  Castrate resistant prostate cancer  Scheduled for leuprolide May 2022  Continue oncology follow-up  Follow-up with me 1 year or earlier for any voiding problems   Abbie Sons, MD  [facility] [address], [place], [place] [address] [phone]

## 2020-10-04 ENCOUNTER — Other Ambulatory Visit: Payer: Self-pay | Admitting: *Deleted

## 2020-10-04 ENCOUNTER — Telehealth: Payer: Self-pay | Admitting: *Deleted

## 2020-10-04 MED ORDER — REPATHA SURECLICK 140 MG/ML ~~LOC~~ SOAJ: SUBCUTANEOUS | 5 refills | Status: DC

## 2020-10-04 NOTE — Telephone Encounter (Signed)
Pt requiring PA for Repatha 140 mg/ml. PA has been submitted via covermymeds. Awaiting response.  [employer] Member ID: [id number]  Medicare ID: [id number]

## 2020-10-04 NOTE — Telephone Encounter (Signed)
PA Case: [id number], Status: Approved, Coverage Starts on: 08/13/2020 12:00:00 AM, Coverage Ends on: 08/12/2021 12:00:00 AM. Questions? Contact [phone].

## 2020-10-12 ENCOUNTER — Other Ambulatory Visit: Payer: Self-pay | Admitting: Internal Medicine

## 2020-10-12 ENCOUNTER — Telehealth: Payer: Self-pay | Admitting: Cardiovascular Disease

## 2020-10-12 DIAGNOSIS — I10 Essential (primary) hypertension: Secondary | ICD-10-CM

## 2020-10-12 DIAGNOSIS — I2510 Atherosclerotic heart disease of native coronary artery without angina pectoris: Secondary | ICD-10-CM

## 2020-10-12 DIAGNOSIS — E785 Hyperlipidemia, unspecified: Secondary | ICD-10-CM

## 2020-10-12 DIAGNOSIS — R079 Chest pain, unspecified: Secondary | ICD-10-CM

## 2020-10-12 MED ORDER — LOSARTAN POTASSIUM 25 MG PO TABS: ORAL_TABLET | ORAL | 1 refills | Status: DC

## 2020-10-12 NOTE — Telephone Encounter (Signed)
Received fax from [facility] requesting refills for Losartan 25 mg. Rx request sent to pharmacy.

## 2020-10-14 DIAGNOSIS — G4733 Obstructive sleep apnea (adult) (pediatric): Secondary | ICD-10-CM | POA: Diagnosis not present

## 2020-11-01 ENCOUNTER — Ambulatory Visit (INDEPENDENT_AMBULATORY_CARE_PROVIDER_SITE_OTHER): Payer: Medicare Other | Admitting: Internal Medicine

## 2020-11-01 ENCOUNTER — Other Ambulatory Visit: Payer: Self-pay

## 2020-11-01 ENCOUNTER — Encounter: Payer: Self-pay | Admitting: Internal Medicine

## 2020-11-01 VITALS — BP 128/78 | HR 71 | Ht 69.0 in | Wt 247.2 lb

## 2020-11-01 DIAGNOSIS — I2510 Atherosclerotic heart disease of native coronary artery without angina pectoris: Secondary | ICD-10-CM

## 2020-11-01 DIAGNOSIS — E1165 Type 2 diabetes mellitus with hyperglycemia: Secondary | ICD-10-CM

## 2020-11-01 DIAGNOSIS — E1159 Type 2 diabetes mellitus with other circulatory complications: Secondary | ICD-10-CM | POA: Diagnosis not present

## 2020-11-01 DIAGNOSIS — E785 Hyperlipidemia, unspecified: Secondary | ICD-10-CM

## 2020-11-01 DIAGNOSIS — Z6839 Body mass index (BMI) 39.0-39.9, adult: Secondary | ICD-10-CM | POA: Diagnosis not present

## 2020-11-01 LAB — POCT GLYCOSYLATED HEMOGLOBIN (HGB A1C): Hemoglobin A1C: 8.2 % — AB (ref 4.0–5.6)

## 2020-11-01 MED ORDER — TRULICITY 4.5 MG/0.5ML ~~LOC~~ SOAJ: 4.5000 mg | SUBCUTANEOUS | 3 refills | Status: DC

## 2020-11-01 MED ORDER — NOVOLOG FLEXPEN 100 UNIT/ML ~~LOC~~ SOPN: 16.0000 [IU] | PEN_INJECTOR | Freq: Every day | SUBCUTANEOUS | 11 refills | Status: DC

## 2020-11-01 NOTE — Progress Notes (Signed)
Patient ID: Phillip Clements, male   DOB: 26-Mar-1951, 70 y.o.   MRN: [id number]  This visit occurred during the SARS-CoV-2 public health emergency.  Safety protocols were in place, including screening questions prior to the visit, additional usage of staff PPE, and extensive cleaning of exam room while observing appropriate contact time as indicated for disinfecting solutions.   HPI: Phillip Clements is a 70 y.o.-year-old male, returning for f/u for DM2, dx in 1996, insulin-dependent since 2008, uncontrolled, with complications (CAD - s/p stents, CABG, cerebro-vascular ds - s/p CVA, PVD). Last visit 4 months ago. He changed to [facility] in 10/2016.  Interim history: Approximately 10 days ago he started to change his diet: Introduced a small breakfast, skips lunch, and has a later dinner (meat and salad/veggies).  She lost 7 pounds per his scale at home.  However, sugars did not improve much. He continues to work outside on his property 3 to 4 hours a day. He feels well, without complaints at this visit.  Reviewed HbA1c levels: Lab Results  Component Value Date   HGBA1C 8.2 (A) 07/01/2020   HGBA1C 8.2 (A) 02/25/2020   HGBA1C 7.8 (A) 06/26/2019   Pt was on a regimen of: - Invokana 300 mg before b'fast - Trulicity 1.5 mg weekly - Novolog 40 units 3x a day - 15 min before meals - Toujeo 120  units (60 x2) after dinner He tried Victoza and Januvia. Had GI intolerance (nausea, diarrhea) to regular Metformin and also with metformin ER >>stopped.  He was then on: - Invokana 300 mg before breakfast - Trulicity 1.5 mg weekly - U500 insulin 55-70 units 3 times a day before meals Also, if sugars in the morning are: - 160-200, take 10 units - 201-240, take 15 units - >240, take 20 units  He is currently on: - Invokana 300 mg bfore b'fast - Trulicity 1.5 >> 3 mg weekly. - Tresiba 32 >> 44 units daily-started 10/2018 >> 66 units daily - NovoLog 10-16 units 15 min before dinner - added  06/2020  He is checking sugars once a day: - am: 119, 150-200 >> n/c >> 115-195, 226 >> 168-263 - 2h after b'fast: n/c  - before lunch: 100-120 >> n/c >> 126-166 >> n/c - 2h after lunch: n/c >> 170 - before dinner: 160s >> 150 >> n/c >> 102 >> 110-169, 191 - 2h after dinner: n/c >> 280 >> 224-269 >> 297, 367 - bedtime: 201, 214 >> 128-150 >> n/c - nighttime: n/c Lowest sugar was 56 (on insulin) >> ... >>  102 >> 110; he has hypoglycemia awareness in the 70s. Highest sugar was  445 >> .Marland KitchenMarland Kitchen 300 x 1 >> 367.  Glucometer: OneTouch Ultra mini  Pt's meals are mostly plant-based: - Breakfast:  Skips >> PB and apple - Lunch: sandwich, chips, cottage cheese, pineapple >> skips - Dinner: 2 veggies, salads; Sat night: meat >> grilled chicken and salad - Snacks: 2/day: celery + PB; low salt triscuit + laughing cow cheese  He continues to walk on the treadmill 2 out of 7 days at the gym and working outside -3-4h a day.  + Mild CKD, last BUN/creatinine:  Lab Results  Component Value Date   BUN 22 08/26/2020   CREATININE 0.87 08/26/2020  On losartan. -+ HL: Lab Results  Component Value Date   CHOL 166 10/09/2019   HDL 42 10/09/2019   LDLCALC 96 10/09/2019   LDLDIRECT 157.1 09/07/2013   TRIG 138 10/09/2019   CHOLHDL 4.0  10/09/2019  He could not tolerate statins due to joint pains.  He continues on Repatha and Zetia. - last eye exam was in 05/2020: No DR reportedly; + history of cataract surgery. [place]. - no Numbness and tingling in his feet.  He has prostate cancer, believed to be metastatic to the bones.  On Lupron.  He finished radiation therapy.  ROS: Constitutional: no weight gain/no weight loss, + fatigue with exertion, no subjective hyperthermia, no subjective hypothermia Eyes: no blurry vision, no xerophthalmia ENT: no sore throat, + see HPI Cardiovascular: no CP/no SOB/no palpitations/no leg swelling Respiratory: no cough/no SOB/no wheezing Gastrointestinal: no  N/no V/no D/no C/no acid reflux Musculoskeletal: no muscle aches/no joint aches Skin: no rashes, no hair loss Neurological: no tremors/no numbness/no tingling/no dizziness  I reviewed pt's medications, allergies, PMH, social hx, family hx, and changes were documented in the history of present illness. Otherwise, unchanged from my initial visit note.  Past Medical History:  Diagnosis Date  . Cataract    bilateral eye in 2010  . Coronary artery disease    a. CABG 04/2014: (LIMA->LAD, VG->DIAG, VG->OM2, VG->PDA)  b. 07/2014 early graft failure (VG->OM2 60, LIMA->LAD atretic, VG->PDA & VG->D1 100). s/p successful rotational atherectomy & DES of the LM & pLAD (3.0x24 Promus DES) & mLAD (2.25x24 Promus DES).  . CVA (cerebral infarction)   . Family history of prostate cancer   . Hx of adenomatous colonic polyps 06/17/2017  . Hyperlipidemia    a. Statin intolerant.  . Hypertension 2000  . Moderate mitral regurgitation    a. 02/2015 Echo: EF 55-60%, no rwma, mod MR, mod BAE, mildly dil RV w/ low nl RV fxn.  . OSA on CPAP   . Peripheral vascular disease ([place])    a. left SFA angioplasty in December 2016  . Prostate cancer ([place])   . Sleep apnea    wears CPAP nightly  . Type II diabetes mellitus ([place])    Past Surgical History:  Procedure Laterality Date  . ANTERIOR CERVICAL DECOMP/DISCECTOMY FUSION  08/25/2012   Procedure: ANTERIOR CERVICAL DECOMPRESSION/DISCECTOMY FUSION 2 LEVELS;  Surgeon: Hosie Spangle, MD;  Location: [facility];  Service: Neurosurgery;  Laterality: N/A;  Cervical five-six Cervical six-seven anterior cervical decompression with fusion and plating and bonegraft  . BACK SURGERY    . CARDIAC CATHETERIZATION  04/2014; 07/19/2014  . CORONARY ARTERY BYPASS GRAFT N/A 04/26/2014   Procedure: CORONARY ARTERY BYPASS GRAFTING (CABG), on pump, times four, using left internal mammary artery, right greater saphenous vein harvested endoscopically.;  Surgeon: Ivin Poot, MD;  Location:  [facility];  Service: Open Heart Surgery;  Laterality: N/A;  LIMA to LAD, SVG to DIAGONAL, SVG to OM1, SVG to PDA) with EVH of the RIGHT THIGH and LOWER EXTREMITY SAPHENOUS VEIN  . Cytoscopy prostatic stone o/w nml  06/21/08   Dr. Jacqlyn Larsen  . ETT myoview  09/13/09   Low risk EF 49%  . High intense focused ultrasound  07/20/06   By Dr. Jacqlyn Larsen  . INTRAOPERATIVE TRANSESOPHAGEAL ECHOCARDIOGRAM N/A 04/26/2014   Procedure: INTRAOPERATIVE TRANSESOPHAGEAL ECHOCARDIOGRAM;  Surgeon: Ivin Poot, MD;  Location: [facility];  Service: Open Heart Surgery;  Laterality: N/A;  . PERCUTANEOUS CORONARY ROTOBLATOR INTERVENTION (PCI-R) N/A 07/22/2014   Procedure: PERCUTANEOUS CORONARY ROTOBLATOR INTERVENTION (PCI-R);  Surgeon: Peter M Martinique, MD;  Location: [facility];  Service: Cardiovascular;  Laterality: N/A;  . PERIPHERAL VASCULAR CATHETERIZATION Left 07/26/2015   Procedure: Lower Extremity Angiography;  Surgeon: Katha Cabal,  MD;  Location: [facility];  Service: Cardiovascular;  Laterality: Left;  . PERIPHERAL VASCULAR CATHETERIZATION  07/26/2015   Procedure: Lower Extremity Intervention;  Surgeon: Katha Cabal, MD;  Location: [facility];  Service: Cardiovascular;;  . PROSTATE BIOPSY  04/03/06 & 02/25/07   Social History   Socioeconomic History  . Marital status: Married    Spouse name: Not on file  . Number of children: 3  . Years of education: Not on file  . Highest education level: Not on file  Occupational History  . Occupation: Insurance account manager  Tobacco Use  . Smoking status: Former Smoker    Packs/day: 1.00    Years: 36.00    Pack years: 36.00    Types: Cigarettes    Quit date: 03/01/2014    Years since quitting: 6.6  . Smokeless tobacco: Never Used  Vaping Use  . Vaping Use: Never used  Substance and Sexual Activity  . Alcohol use: Yes    Comment: 07/19/2014 "might have a drink a couple times/yr"  . Drug use: No  . Sexual activity: Not  Currently  Other Topics Concern  . 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 In [place].    Social Determinants of Health   Financial Resource Strain: Not on file  Food Insecurity: Not on file  Transportation Needs: Not on file  Physical Activity: Not on file  Stress: Not on file  Social Connections: Not on file  Intimate Partner Violence: Not on file   Current Outpatient Medications on File Prior to Visit  Medication Sig Dispense Refill  . aspirin EC 81 MG tablet Take 1 tablet (81 mg total) by mouth daily. 90 tablet 3  . carvedilol (COREG) 3.125 MG tablet TAKE 1 TABLET BY MOUTH TWICE A DAY WITH A MEAL 180 tablet 0  . clopidogrel (PLAVIX) 75 MG tablet TAKE 1 TABLET BY MOUTH ONCE DAILY 30 tablet 2  . Evolocumab (REPATHA SURECLICK) 694 MG/ML SOAJ INJECT ONE PEN INTO THE SKIN EVERY 14 DAYS 2 mL 5  . ezetimibe (ZETIA) 10 MG tablet Take 1 tablet (10 mg total) by mouth daily. 30 tablet 7  . furosemide (LASIX) 20 MG tablet Take 1 tablet (20 mg total) by mouth daily. 90 tablet 1  . glucose blood (ONETOUCH ULTRA) test strip CHECK BLOOD SUGAR TWICE A DAY - DX E11.59 200 strip 3  . Injection Device (CORNWALL METAL PIPETTING) MISC Frequency:PHARMDIR   Dosage:0.0     Instructions:  Note:diagnosis 250.02 Dose: NA    . insulin aspart (NOVOLOG FLEXPEN) 100 UNIT/ML FlexPen Inject 10-16 Units into the skin daily before supper. 15 mL 11  . Insulin Pen Needle 32G X 4 MM MISC Use 1x a day 100 each 3  . INVOKANA 300 MG TABS tablet TAKE 1 TABLET BY MOUTH ONCE A DAY WITH BREAKFAST 90 tablet 3  . leuprolide (LUPRON) 30 MG injection Inject 45 mg into the muscle every 6 (six) months.    Marland Kitchen losartan (COZAAR) 25 MG tablet TAKE 1/2 TABLET BY MOUTH ONCE A DAY 45 tablet 1  . meclizine (ANTIVERT) 25 MG tablet Take 0.5-1  tablets (12.5-25 mg total) by mouth 2 (two) times daily as needed for dizziness or nausea. 30 tablet 0  . mirabegron ER (MYRBETRIQ) 50 MG TB24 tablet  Take 1 tablet (50 mg total) by mouth daily. 30 tablet 12  . nitroGLYCERIN (NITROSTAT) 0.4 MG SL tablet Place 1 tablet (0.4 mg total) under the tongue as needed. 25 tablet 2  . tamsulosin (FLOMAX) 0.4 MG CAPS capsule TAKE 1 CAPSULE BY MOUTH ONCE DAILY AFTERSUPPER 30 capsule 11  . TRESIBA FLEXTOUCH 200 UNIT/ML FlexTouch Pen INJECT 44 UNITS INTO THE SKIN DAILY 9 mL 0  . TRULICITY 3 EP/3.2RJ SOPN INJECT 3 MG INTO THE SKIN ONCE A WEEK 6 mL 2   No current facility-administered medications on file prior to visit.   Allergies  Allergen Reactions  . Coreg [Carvedilol] Other (See Comments)    Fatigue- unclear if this was due to coreg specifically or beta blockers in general.    . Metformin And Related Nausea Only  . Protonix [Pantoprazole Sodium] Diarrhea  . Rosuvastatin Calcium     REACTION: JOINT ACHES  . Statins     REACTION: JOINT ACHES   Family History  Problem Relation Age of Onset  . Diabetes Mother 60       DM  . Coronary artery disease Mother   . Hypertension Mother   . Heart attack Mother        CABG with MI in 2005  . Heart disease Mother        CV  . Hypertension Father   . Heart attack Father        CABG with Mi around 1997  . Coronary artery disease Father   . Heart disease Father        CV  . Diabetes Father   . Heart attack Brother        MI/PTCA  . Heart disease Brother        CV  . Diabetes Brother   . Heart attack Maternal Grandfather        MI  . Prostate cancer Paternal Grandfather 74       likely metastatic  . Diabetes Sister        DM  . Breast cancer Neg Hx        Breast/ovarian/uterine cancer  . Colon cancer Neg Hx   . Depression Neg Hx   . Alcohol abuse Neg Hx        ETOH/drug abuse  . Stroke Neg Hx    PE: BP 128/78 (BP Location: Left Arm, Patient Position: Sitting, Cuff Size: Large)   Pulse 71    Ht 5\' 9"  (1.753 m)   Wt 247 lb 3.2 oz (112.1 kg)   SpO2 97%   BMI 36.51 kg/m  Body mass index is 36.51 kg/m. Wt Readings from Last 3 Encounters:  11/01/20 247 lb 3.2 oz (112.1 kg)  09/30/20 242 lb (109.8 kg)  08/26/20 242 lb (109.8 kg)   Constitutional: overweight, in NAD Eyes: PERRLA, EOMI, no exophthalmos ENT: moist mucous membranes, no thyromegaly, no cervical lymphadenopathy Cardiovascular: RRR, No MRG Respiratory: CTA B Gastrointestinal: abdomen soft, NT, ND, BS+ Musculoskeletal: no deformities, strength intact in all 4 Skin: moist, warm, no rashes Neurological: no tremor with outstretched hands, DTR normal in all 4  ASSESSMENT: 1. DM2, insulin-dependent, uncontrolled, with complications - CAD - s/p stents, CABG - cerebro-vascular ds - s/p CVA  - PVD - s/p L stent  2. HL  3.  Obesity class II BMI Classification:  < 18.5 underweight   18.5-24.9 normal weight   25.0-29.9 overweight   30.0-34.9 class I obesity   35.0-39.9 class II obesity   ? 40.0  class III obesity   PLAN:  1. Patient with longstanding, insulin resistant diabetes, previously on U500 insulin, but then off insulin after improvement of his sugars on Invokana and Trulicity.  However, before last visit he relaxed his diet and was not checking blood sugars and HbA1c were sent.  We restarted Tyler Aas but at last visit sugars were still very high, especially after dinner, was above 200s.  In the morning, the majority of the blood sugars were at goal but occasionally higher.  He was not checking sugars in the middle of the day and I advised him to start doing so.  At that time, we added NovoLog before dinner.  HbA1c was 8.2%. -At today's visit, sugars remain quite high in the morning, higher than at last visit.  None of his morning sugars are at goal of lower than 130.  He is checking sugars before dinner but seldom after dinner.  Whenever he checks after dinner, these are very high, in the upper 200s anywhere  300s, despite taking NovoLog 16 units before dinner.  I advised him to increase this dose and I am hoping that the sugars will improve now that he changed his diet.  However, I encouraged him to not eat a lot of calories with dinner.  We can also increase his Trulicity dose a little more, to 4.5 mg weekly, since he tolerates this well.  We will continue Invokana and Tresiba at the same doses for now. - I suggested to: Patient Instructions  Please continue: - Invokana  300 mg bfore b'fast - Tresiba 66 units daily   Please increase: - NovoLog 16-24 units 15 min before dinner - Trulicity 4.5 mg weekly.  Please return in 4 months with your sugar log.   - we checked his HbA1c: 8.2% (stable) - advised to check sugars at different times of the day - 1-2x a day, rotating check times - advised for yearly eye exams >> he is UTD - return to clinic in 4 months  2. HL -Reviewed latest lipid panel from 09/2019: Fractions at goal but LDL was above our target of less than 70 due to CAD Lab Results  Component Value Date   CHOL 166 10/09/2019   HDL 42 10/09/2019   LDLCALC 96 10/09/2019   LDLDIRECT 157.1 09/07/2013   TRIG 138 10/09/2019   CHOLHDL 4.0 10/09/2019  -Intolerant to statins.  He continues Repatha and ezetimibe 10 mg daily without side effects  3. Obesity -Also contributed to by the Lupron injections -In the past, he lost a significant amount of weight (35 pounds) on keto diet.  Afterwards, weight fluctuated by 8 pounds, however, before last visit he gained 30 pounds -continue SGLT 2 inhibitor and GLP-1 receptor agonist which should also help with weight loss - lost few lbs since last OV per his scale at home, but not per our scale  Philemon Kingdom, MD PhD [facility]

## 2020-11-01 NOTE — Patient Instructions (Addendum)
Please continue: - Invokana  300 mg bfore b'fast - Tresiba 66 units daily   Please increase: - NovoLog 16-24 units 15 min before dinner - Trulicity 4.5 mg weekly.  Please return in 4 months with your sugar log.

## 2020-11-11 ENCOUNTER — Ambulatory Visit (INDEPENDENT_AMBULATORY_CARE_PROVIDER_SITE_OTHER): Payer: Medicare Other | Admitting: Family Medicine

## 2020-11-11 ENCOUNTER — Encounter: Payer: Self-pay | Admitting: Family Medicine

## 2020-11-11 ENCOUNTER — Other Ambulatory Visit: Payer: Self-pay

## 2020-11-11 VITALS — BP 118/60 | HR 65 | Temp 97.2°F | Ht 69.0 in | Wt 240.0 lb

## 2020-11-11 DIAGNOSIS — R195 Other fecal abnormalities: Secondary | ICD-10-CM

## 2020-11-11 DIAGNOSIS — I2510 Atherosclerotic heart disease of native coronary artery without angina pectoris: Secondary | ICD-10-CM

## 2020-11-11 LAB — CBC WITH DIFFERENTIAL/PLATELET
Basophils Absolute: 0 10*3/uL (ref 0.0–0.1)
Basophils Relative: 0.2 % (ref 0.0–3.0)
Eosinophils Absolute: 0 10*3/uL (ref 0.0–0.7)
Eosinophils Relative: 1.3 % (ref 0.0–5.0)
HCT: 46.2 % (ref 39.0–52.0)
Hemoglobin: 15.9 g/dL (ref 13.0–17.0)
Lymphocytes Relative: 37.5 % (ref 12.0–46.0)
Lymphs Abs: 1.2 10*3/uL (ref 0.7–4.0)
MCHC: 34.5 g/dL (ref 30.0–36.0)
MCV: 84.7 fl (ref 78.0–100.0)
Monocytes Absolute: 0.3 10*3/uL (ref 0.1–1.0)
Monocytes Relative: 10.4 % (ref 3.0–12.0)
Neutro Abs: 1.6 10*3/uL (ref 1.4–7.7)
Neutrophils Relative %: 50.6 % (ref 43.0–77.0)
Platelets: 118 10*3/uL — ABNORMAL LOW (ref 150.0–400.0)
RBC: 5.46 Mil/uL (ref 4.22–5.81)
RDW: 13.6 % (ref 11.5–15.5)
WBC: 3.1 10*3/uL — ABNORMAL LOW (ref 4.0–10.5)

## 2020-11-11 LAB — COMPREHENSIVE METABOLIC PANEL
ALT: 45 U/L (ref 0–53)
AST: 29 U/L (ref 0–37)
Albumin: 4 g/dL (ref 3.5–5.2)
Alkaline Phosphatase: 59 U/L (ref 39–117)
BUN: 23 mg/dL (ref 6–23)
CO2: 28 mEq/L (ref 19–32)
Calcium: 8.6 mg/dL (ref 8.4–10.5)
Chloride: 104 mEq/L (ref 96–112)
Creatinine, Ser: 1.19 mg/dL (ref 0.40–1.50)
GFR: 62.39 mL/min (ref >60.00)
Glucose, Bld: 159 mg/dL — ABNORMAL HIGH (ref 70–99)
Potassium: 4.1 mEq/L (ref 3.5–5.1)
Sodium: 142 mEq/L (ref 135–145)
Total Bilirubin: 0.6 mg/dL (ref 0.2–1.2)
Total Protein: 6.1 g/dL (ref 6.0–8.3)

## 2020-11-11 LAB — LIPASE: Lipase: 25 U/L (ref 11.0–59.0)

## 2020-11-11 MED ORDER — TRESIBA FLEXTOUCH 200 UNIT/ML ~~LOC~~ SOPN: 66.0000 [IU] | PEN_INJECTOR | Freq: Every day | SUBCUTANEOUS | Status: DC

## 2020-11-11 NOTE — Patient Instructions (Addendum)
Go to the lab on the way out.   If you have mychart we'll likely use that to update you.    Rest and fluids.  Avoid fatty meals.   Hold trulicity for now.   I'll update Dr. Cruzita Lederer.  Please let me know how you are feeling in a few days. If worse call the clinic at [phone]. Take care.  Glad to see you.

## 2020-11-11 NOTE — Progress Notes (Signed)
This visit occurred during the SARS-CoV-2 public health emergency.  Safety protocols were in place, including screening questions prior to the visit, additional usage of staff PPE, and extensive cleaning of exam room while observing appropriate contact time as indicated for disinfecting solutions.  For the last month or so, he'll have GI upset with meals.  Worse in the last week.  Not on metformin.  Fecal urgency.  Loose stools. Dec in PO intake/appetite.  He had a diet change in the last few weeks.  He was taking apple and peanut butter for breakfast, skipping lunch, and a variable dinner.  He didn't think he was tolerating the apple and peanut butter, so he quit that.   More burping in the meantime.  No fevers.  Had some chills.  He has been going out to property to work but couldn't get warm, in spite of being appropriately dressed.  Some fatigue, going to bed early. No blood in stool.  No black stools.  No vomiting.  No one else is known to be sick at home.  General feeling of abd upset but not frank pain.  He usually does trulicity on Sunday.   He last took trulicity ~9/32/6712.  No dysuria.    He feels better than he did a few days ago.    Most recent PSA was low.    Meds, vitals, and allergies reviewed.   ROS: Per HPI unless specifically indicated in ROS section   GEN: nad, alert and oriented HEENT: ncat NECK: supple w/o LA CV: rrr PULM: ctab, no inc wob ABD: soft, +bs EXT: no edema SKIN: Well-perfused.  No jaundice.  30 minutes were devoted to patient care in this encounter (this includes time spent reviewing the patient's file/history, interviewing and examining the patient, counseling/reviewing plan with patient).

## 2020-11-13 ENCOUNTER — Other Ambulatory Visit: Payer: Self-pay | Admitting: Family Medicine

## 2020-11-13 DIAGNOSIS — R195 Other fecal abnormalities: Secondary | ICD-10-CM | POA: Insufficient documentation

## 2020-11-13 DIAGNOSIS — R7989 Other specified abnormal findings of blood chemistry: Secondary | ICD-10-CM

## 2020-11-13 NOTE — Assessment & Plan Note (Signed)
See notes on labs.  I need endocrinology input.  Routed to Dr. Cruzita Lederer with appreciation.  He feels better in the meantime.  I question if his symptoms are related to Trulicity use.  He last took it on March 20.  I asked him to hold it over the weekend so we can check his labs.  See notes on labs.  Benign abdominal exam.  Okay for outpatient follow-up.  He agrees with plan.

## 2020-11-15 ENCOUNTER — Encounter: Payer: Self-pay | Admitting: Internal Medicine

## 2020-11-24 ENCOUNTER — Inpatient Hospital Stay: Payer: Medicare Other | Attending: Internal Medicine

## 2020-11-24 DIAGNOSIS — C61 Malignant neoplasm of prostate: Secondary | ICD-10-CM | POA: Diagnosis not present

## 2020-11-24 DIAGNOSIS — Z79899 Other long term (current) drug therapy: Secondary | ICD-10-CM | POA: Insufficient documentation

## 2020-11-24 DIAGNOSIS — Z87891 Personal history of nicotine dependence: Secondary | ICD-10-CM | POA: Diagnosis not present

## 2020-11-24 DIAGNOSIS — C7951 Secondary malignant neoplasm of bone: Secondary | ICD-10-CM | POA: Insufficient documentation

## 2020-11-24 DIAGNOSIS — Z8042 Family history of malignant neoplasm of prostate: Secondary | ICD-10-CM | POA: Insufficient documentation

## 2020-11-24 LAB — PSA: Prostatic Specific Antigen: <0.01 ng/mL (ref 0.00–4.00)

## 2020-11-28 ENCOUNTER — Ambulatory Visit
Admission: RE | Admit: 2020-11-28 | Discharge: 2020-11-28 | Disposition: A | Discharge status: Home / Self Care | Payer: Medicare Other | Source: Ambulatory Visit | Attending: Radiation Oncology | Admitting: Radiation Oncology

## 2020-11-28 ENCOUNTER — Encounter: Payer: Self-pay | Admitting: Radiation Oncology

## 2020-11-28 VITALS — BP 128/62 | HR 62 | Temp 95.5°F | Wt 246.0 lb

## 2020-11-28 DIAGNOSIS — C61 Malignant neoplasm of prostate: Secondary | ICD-10-CM

## 2020-11-28 DIAGNOSIS — Z08 Encounter for follow-up examination after completed treatment for malignant neoplasm: Secondary | ICD-10-CM | POA: Diagnosis not present

## 2020-11-28 NOTE — Progress Notes (Signed)
Radiation Oncology Follow up Note  Name: Phillip Clements   Date:   11/28/2020 MRN:  [id number] DOB: Mar 16, 1951    This 70 y.o. male presents to the clinic today for 37-month follow-up status post salvage radiation therapy after HIFU for Gleason 6 adenocarcinoma the prostate originally presented with a PSA of 24.  REFERRING PROVIDER: Tonia Ghent, MD  HPI: Patient is a 70 year old male now about 10 months having completed salvage radiation therapy.  He initially undergo went on HIFU for Gleason 6 adenocarcinoma presenting with a PSA of 24.7 seen today in routine follow-up he still has urgency frequency.  He is tends to attribute a lot of this to his adult onset diabetes which is somewhat poorly controlled.  He is having no GI symptoms.  His most recent PSA is less than 0.01.  Patient is currently on Zometa as well as receiving ADT therapy.  COMPLICATIONS OF TREATMENT: none  FOLLOW UP COMPLIANCE: keeps appointments   PHYSICAL EXAM:  BP 128/62   Pulse 62   Temp (!) 95.5 F (35.3 C) (Tympanic)   Wt 246 lb (111.6 kg)   BMI 36.33 kg/m  Well-developed well-nourished patient in NAD. HEENT reveals PERLA, EOMI, discs not visualized.  Oral cavity is clear. No oral mucosal lesions are identified. Neck is clear without evidence of cervical or supraclavicular adenopathy. Lungs are clear to A&P. Cardiac examination is essentially unremarkable with regular rate and rhythm without murmur rub or thrill. Abdomen is benign with no organomegaly or masses noted. Motor sensory and DTR levels are equal and symmetric in the upper and lower extremities. Cranial nerves II through XII are grossly intact. Proprioception is intact. No peripheral adenopathy or edema is identified. No motor or sensory levels are noted. Crude visual fields are within normal range.  RADIOLOGY RESULTS: No current films for review  PLAN: Present time patient is doing well under excellent biochemical control of his prostate cancer.  He  continues on ADT therapy and management by medical oncology.  I have asked to see the patient back in 6 months for follow-up.  Patient knows to call with any concerns.  I would like to take this opportunity to thank you for allowing me to participate in the care of your patient.Noreene Filbert, MD

## 2020-11-29 ENCOUNTER — Other Ambulatory Visit: Payer: Self-pay

## 2020-11-29 ENCOUNTER — Inpatient Hospital Stay: Payer: Medicare Other

## 2020-11-29 ENCOUNTER — Inpatient Hospital Stay (HOSPITAL_BASED_OUTPATIENT_CLINIC_OR_DEPARTMENT_OTHER): Payer: Medicare Other | Admitting: Internal Medicine

## 2020-11-29 DIAGNOSIS — C61 Malignant neoplasm of prostate: Secondary | ICD-10-CM

## 2020-11-29 DIAGNOSIS — Z87891 Personal history of nicotine dependence: Secondary | ICD-10-CM | POA: Diagnosis not present

## 2020-11-29 DIAGNOSIS — C7951 Secondary malignant neoplasm of bone: Secondary | ICD-10-CM | POA: Diagnosis not present

## 2020-11-29 DIAGNOSIS — I2510 Atherosclerotic heart disease of native coronary artery without angina pectoris: Secondary | ICD-10-CM

## 2020-11-29 DIAGNOSIS — Z8042 Family history of malignant neoplasm of prostate: Secondary | ICD-10-CM | POA: Diagnosis not present

## 2020-11-29 DIAGNOSIS — Z79899 Other long term (current) drug therapy: Secondary | ICD-10-CM | POA: Diagnosis not present

## 2020-11-29 LAB — COMPREHENSIVE METABOLIC PANEL
ALT: 22 U/L (ref 0–44)
AST: 15 U/L (ref 15–41)
Albumin: 4.1 g/dL (ref 3.5–5.0)
Alkaline Phosphatase: 77 U/L (ref 38–126)
Anion gap: 9 (ref 5–15)
BUN: 27 mg/dL — ABNORMAL HIGH (ref 8–23)
CO2: 28 mmol/L (ref 22–32)
Calcium: 9.4 mg/dL (ref 8.9–10.3)
Chloride: 98 mmol/L (ref 98–111)
Creatinine, Ser: 1.18 mg/dL (ref 0.61–1.24)
GFR, Estimated: >60 mL/min (ref >60)
Glucose, Bld: 318 mg/dL — ABNORMAL HIGH (ref 70–99)
Potassium: 4.3 mmol/L (ref 3.5–5.1)
Sodium: 135 mmol/L (ref 135–145)
Total Bilirubin: 1.3 mg/dL — ABNORMAL HIGH (ref 0.3–1.2)
Total Protein: 7.2 g/dL (ref 6.5–8.1)

## 2020-11-29 LAB — CBC WITH DIFFERENTIAL/PLATELET
Abs Immature Granulocytes: 0.05 10*3/uL (ref 0.00–0.07)
Basophils Absolute: 0 10*3/uL (ref 0.0–0.1)
Basophils Relative: 1 %
Eosinophils Absolute: 0.1 10*3/uL (ref 0.0–0.5)
Eosinophils Relative: 2 %
HCT: 46.2 % (ref 39.0–52.0)
Hemoglobin: 16.3 g/dL (ref 13.0–17.0)
Immature Granulocytes: 1 %
Lymphocytes Relative: 23 %
Lymphs Abs: 1.3 10*3/uL (ref 0.7–4.0)
MCH: 29.3 pg (ref 26.0–34.0)
MCHC: 35.3 g/dL (ref 30.0–36.0)
MCV: 82.9 fL (ref 80.0–100.0)
Monocytes Absolute: 0.5 10*3/uL (ref 0.1–1.0)
Monocytes Relative: 8 %
Neutro Abs: 3.9 10*3/uL (ref 1.7–7.7)
Neutrophils Relative %: 65 %
Platelets: 206 10*3/uL (ref 150–400)
RBC: 5.57 MIL/uL (ref 4.22–5.81)
RDW: 12.7 % (ref 11.5–15.5)
WBC: 5.9 10*3/uL (ref 4.0–10.5)
nRBC: 0 % (ref 0.0–0.2)

## 2020-11-29 LAB — PSA: Prostatic Specific Antigen: <0.01 ng/mL (ref 0.00–4.00)

## 2020-11-29 NOTE — Assessment & Plan Note (Addendum)
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 S/P- EBRT to prostate [last Tx- on 01/08/2020]. PET Oct 10th, 2021-Mild asymmetric activity within the prostate gland decreased from comparison from Feb 2021; No convincing evidence of nodal metastasis. No visceral metastasis or skeletal metastasis. Currently off- Darolutamide [sec to poor tolerance].   April 2022- <0.01.   # Continue surveillance closely off any oral novel ADT therapies given the previous intolerance to Darolutamide.  Continue Eligard [every 6 months; 11/22; Dr. Stoioff].   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 Recommend ca+Vit D 1000 mg/day.  I left a message for patient's dentist Dr. Deatra Ina.  We will decide on Zometa as per the recommendation of the dentist.  # DISPOSITION: [phone]- Dr.Leonard Deatra Ina.  # HOLD zometa today. # follow up TBD-Dr.B -------------------------------------------------------------------------- #  Follow up in 3 months; MD- labs- cbc/cmp/PSA; possible zometa- Dr.B

## 2020-11-29 NOTE — Progress Notes (Signed)
[facility] CONSULT NOTE  Patient Care Team: Tonia Ghent, MD as PCP - General (Family Medicine) Wellington Hampshire, MD as PCP - Cardiology (Cardiology)  CHIEF COMPLAINTS/PURPOSE OF CONSULTATION: Prostate cancer  #  Oncology History Overview Note  1. Prostate adenocarcinoma  a. 02/2006 PSA returned elevated at 17.49  b. 04/03/2006; PSA 24.7; prostate biopsy Gleason 3+3 with 4/4 cores c. tx with HIFU overseas late 2007 in [place] [Dr.Cope]] d. persistent PSA elevation over period 2007-2010; nadir 2.1 09/16/06, peak 15.0 ng/mL  e. 04/2006 bone scan and CT abd/pelvis without evidence of metastasis f. 03/08/2007 CT abdomen/pelvis with no definitive metastatic disease noted  g. 03/18/2007 prostascint scan with no evidence of disease but small pelvic lymph nodes noted  h. 08/15/2009 prostascint scan with no evidence of local prostate cancer or distant metastases  i. 08/22/2009 PSA 15 ng/mL; 02/06/2010 PSA 20.5 ng/mL  j. 03/08/2010 PSA 20.9 ng/mL  k. 06/02/10 initiation of degarelix  l. 05/25/10 PET CT (Fluoride) revealed heterogenous areas of FDG-avidity in ribs, femurs, humeri, areas also in the metatarsals noted which may have been arthiritic in nature  m. 07/03/10 PSA 9.6 ng/mL  o.  Started Lupron/Casodex; June 2020-PSA from 0.2 to 0.3 -I;  As per patient-Casodex was withdrawn-but noted to have a jump of PSA to 0.9. FEB 2021-evidence of prostatic malignancy in the residual prostate bed; no distant metastatic disease   # FEB 2021-PET scan uptake in prostate; no bone mets.-EBRT to prostate [finished end of May 2021]; October 2021 PET scan improved prostate uptake; no evidence metastatic disease.  # EARLY NOV-apalutamide 3pills [pt pref]; STOPPED prior to christmas, 2021 [fatigue/?diarrhea]  #comorbidities-CAD s/p CABG; diabetes; PVD  # Prostate cancer-at age of 45 s/p-genetic counseling-NEG.   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 The report date is 01/22/2020.   The Common Hereditary Cancers Panel offered by [facility] includes sequencing and/or deletion duplication testing of the following 48 genes: APC, ATM, AXIN2, BARD1, BMPR1A, BRCA1, BRCA2, BRIP1, CDH1, CDK4, CDKN2A (p14ARF), CDKN2A (p16INK4a), CHEK2, CTNNA1, DICER1, EPCAM (Deletion/duplication testing only), GREM1 (promoter region deletion/duplication testing only), KIT, MEN1, MLH1, MSH2, MSH3, MSH6, MUTYH, NBN, NF1, NTHL1, PALB2, PDGFRA, PMS2, POLD1, POLE, PTEN, RAD50, RAD51C, RAD51D, RNF43, SDHB, SDHC, SDHD, SMAD4, SMARCA4. STK11, TP53, TSC1, TSC2, and VHL.  The following genes were evaluated for sequence changes only: SDHA and HOXB13 c.251G>A variant only.     HISTORY OF PRESENTING ILLNESS:  ECHO ALLSBROOK 70 y.o.  male with history of castrate resistant prostate cancer with Hx of  metastatic to bone/Lupron-is here for follow-up.  Patient denies any worsening shortness of breath or cough.  Denies any chills or fevers.  Mild fatigue.  Patient had recent dental appointment with his dentist Dr. Deatra Ina.   Review of Systems  Constitutional: Positive for malaise/fatigue. Negative for chills, diaphoresis, fever and weight loss.  HENT: Negative for nosebleeds and sore throat.   Eyes: Negative for double vision.  Respiratory: Negative for cough, hemoptysis, sputum production, shortness of breath and wheezing.   Cardiovascular: Negative for chest pain, palpitations, orthopnea and leg swelling.  Gastrointestinal: Negative for abdominal pain, blood in stool, constipation, diarrhea, heartburn, melena, nausea and vomiting.   Genitourinary: Positive for frequency and urgency.  Musculoskeletal: Positive for back pain. Negative for joint pain.  Skin: Negative.  Negative for itching and rash.  Neurological: Negative for dizziness, tingling, focal weakness, weakness and headaches.  Endo/Heme/Allergies: Does not bruise/bleed easily.  Psychiatric/Behavioral: Negative for depression. The patient is not nervous/anxious and does not have insomnia.      MEDICAL HISTORY:  Past Medical History:  Diagnosis Date  . Cataract    bilateral eye in 2010  . Coronary artery disease    a. CABG 04/2014: (LIMA->LAD, VG->DIAG, VG->OM2, VG->PDA)  b. 07/2014 early graft failure (VG->OM2 60, LIMA->LAD atretic, VG->PDA & VG->D1 100). s/p successful rotational atherectomy & DES of the LM & pLAD (3.0x24 Promus DES) & mLAD (2.25x24 Promus DES).  . CVA (cerebral infarction)   . Family history of prostate cancer   . Hx of adenomatous colonic polyps 06/17/2017  . Hyperlipidemia    a. Statin intolerant.  . Hypertension 2000  . Moderate mitral regurgitation    a. 02/2015 Echo: EF 55-60%, no rwma, mod MR, mod BAE, mildly dil RV w/ low nl RV fxn.  . OSA on CPAP   . Peripheral vascular disease ([place])    a. left SFA angioplasty in December 2016  . Prostate cancer ([place])   . Sleep apnea    wears CPAP nightly  . Type II diabetes mellitus ([place])     SURGICAL HISTORY: Past Surgical History:  Procedure Laterality Date  . ANTERIOR CERVICAL DECOMP/DISCECTOMY FUSION  08/25/2012   Procedure: ANTERIOR CERVICAL DECOMPRESSION/DISCECTOMY FUSION 2 LEVELS;  Surgeon: Hosie Spangle, MD;  Location: [facility];  Service: Neurosurgery;  Laterality: N/A;  Cervical five-six Cervical six-seven anterior cervical decompression with fusion and plating and bonegraft  . BACK SURGERY    . CARDIAC CATHETERIZATION  04/2014; 07/19/2014  . CORONARY ARTERY BYPASS GRAFT N/A 04/26/2014   Procedure: CORONARY ARTERY BYPASS GRAFTING (CABG), on pump, times four, using left  internal mammary artery, right greater saphenous vein harvested endoscopically.;  Surgeon: Ivin Poot, MD;  Location: [facility];  Service: Open Heart Surgery;  Laterality: N/A;  LIMA to LAD, SVG to DIAGONAL, SVG to OM1, SVG to PDA) with EVH of the RIGHT THIGH and LOWER EXTREMITY SAPHENOUS VEIN  . Cytoscopy prostatic stone o/w nml  06/21/08   Dr. Jacqlyn Larsen  . ETT myoview  09/13/09   Low risk EF 49%  . High intense focused ultrasound  07/20/06   By Dr. Jacqlyn Larsen  . INTRAOPERATIVE TRANSESOPHAGEAL ECHOCARDIOGRAM N/A 04/26/2014   Procedure: INTRAOPERATIVE TRANSESOPHAGEAL ECHOCARDIOGRAM;  Surgeon: Ivin Poot, MD;  Location: [facility];  Service: Open Heart Surgery;  Laterality: N/A;  . PERCUTANEOUS CORONARY ROTOBLATOR INTERVENTION (PCI-R) N/A 07/22/2014   Procedure: PERCUTANEOUS CORONARY ROTOBLATOR INTERVENTION (PCI-R);  Surgeon: Peter M Martinique, MD;  Location: [facility] C[facility] LAB;  Service: Cardiovascular;  Laterality: N/A;  . PERIPHERAL VASCULAR CATHETERIZATION Left 07/26/2015   Procedure: Lower Extremity Angiography;  Surgeon: Katha Cabal, MD;  Location: [facility] CV LAB;  Service: Cardiovascular;  Laterality: Left;  . PERIPHERAL VASCULAR CATHETERIZATION  07/26/2015   Procedure: Lower Extremity Intervention;  Surgeon: Katha Cabal, MD;  Location: [facility] CV LAB;  Service: Cardiovascular;;  . PROSTATE BIOPSY  04/03/06 & 02/25/07    SOCIAL HISTORY: Social History   Socioeconomic History  . Marital status: Married    Spouse name: Not on file  . Number of children:  3  . Years of education: Not on file  . Highest education level: Not on file  Occupational History  . Occupation: Insurance account manager  Tobacco Use  . Smoking status: Former Smoker    Packs/day: 1.00    Years: 36.00    Pack years: 36.00    Types: Cigarettes    Quit date: 03/01/2014    Years since quitting: 6.7  . Smokeless tobacco: Never Used  Vaping Use  . Vaping Use: Never used  Substance and Sexual  Activity  . Alcohol use: Yes    Comment: 07/19/2014 "might have a drink a couple times/yr"  . Drug use: No  . Sexual activity: Not Currently  Other Topics Concern  . 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 In [place].    Social Determinants of Health   Financial Resource Strain: Not on file  Food Insecurity: Not on file  Transportation Needs: Not on file  Physical Activity: Not on file  Stress: Not on file  Social Connections: Not on file  Intimate Partner Violence: Not on file    FAMILY HISTORY: Family History  Problem Relation Age of Onset  . Diabetes Mother 36       DM  . Coronary artery disease Mother   . Hypertension Mother   . Heart attack Mother        CABG with MI in 2005  . Heart disease Mother        CV  . Hypertension Father   . Heart attack Father        CABG with Mi around 1997  . Coronary artery disease Father   . Heart disease Father        CV  . Diabetes Father   . Heart attack Brother        MI/PTCA  . Heart disease Brother        CV  . Diabetes Brother   . Heart attack Maternal Grandfather        MI  . Prostate cancer Paternal Grandfather 31       likely metastatic  . Diabetes Sister        DM  . Breast cancer Neg Hx        Breast/ovarian/uterine cancer  . Colon cancer Neg Hx   . Depression Neg Hx   . Alcohol abuse Neg Hx        ETOH/drug abuse  . Stroke Neg Hx     ALLERGIES:  is allergic to coreg [carvedilol], metformin and related, protonix [pantoprazole sodium], rosuvastatin calcium, and statins.  MEDICATIONS:  Current Outpatient Medications  Medication Sig Dispense Refill  . aspirin EC 81 MG tablet Take 1 tablet (81 mg total) by mouth daily. 90 tablet 3  . carvedilol (COREG) 3.125 MG tablet TAKE 1 TABLET BY MOUTH TWICE A DAY WITH A  MEAL 180 tablet 0  . clopidogrel (PLAVIX) 75 MG tablet TAKE 1 TABLET BY MOUTH ONCE DAILY 30 tablet 2  . Dulaglutide (TRULICITY) 4.5 WH/6.7RF SOPN Inject 4.5 mg as directed once a week. 6 mL 3  . Evolocumab (REPATHA SURECLICK) 163 MG/ML SOAJ INJECT ONE PEN INTO THE SKIN EVERY 14 DAYS 2 mL 5  . ezetimibe (ZETIA) 10 MG tablet Take 1 tablet (10 mg total) by mouth daily. 30 tablet 7  .  furosemide (LASIX) 20 MG tablet Take 1 tablet (20 mg total) by mouth daily. 90 tablet 1  . glucose blood (ONETOUCH ULTRA) test strip CHECK BLOOD SUGAR TWICE A DAY - DX E11.59 200 strip 3  . Injection Device (CORNWALL METAL PIPETTING) MISC Frequency:PHARMDIR   Dosage:0.0     Instructions:  Note:diagnosis 250.02 Dose: NA    . insulin aspart (NOVOLOG FLEXPEN) 100 UNIT/ML FlexPen Inject 16-24 Units into the skin daily before supper. 30 mL 11  . insulin degludec (TRESIBA FLEXTOUCH) 200 UNIT/ML FlexTouch Pen Inject 66 Units into the skin daily.    . Insulin Pen Needle 32G X 4 MM MISC Use 1x a day 100 each 3  . INVOKANA 300 MG TABS tablet TAKE 1 TABLET BY MOUTH ONCE A DAY WITH BREAKFAST 90 tablet 3  . leuprolide (LUPRON) 30 MG injection Inject 45 mg into the muscle every 6 (six) months.    Marland Kitchen losartan (COZAAR) 25 MG tablet TAKE 1/2 TABLET BY MOUTH ONCE A DAY 45 tablet 1  . meclizine (ANTIVERT) 25 MG tablet Take 0.5-1 tablets (12.5-25 mg total) by mouth 2 (two) times daily as needed for dizziness or nausea. 30 tablet 0  . mirabegron ER (MYRBETRIQ) 50 MG TB24 tablet Take 1 tablet (50 mg total) by mouth daily. 30 tablet 12  . nitroGLYCERIN (NITROSTAT) 0.4 MG SL tablet Place 1 tablet (0.4 mg total) under the tongue as needed. 25 tablet 2  . tamsulosin (FLOMAX) 0.4 MG CAPS capsule TAKE 1 CAPSULE BY MOUTH ONCE DAILY AFTERSUPPER 30 capsule 11   No current facility-administered medications for this visit.      Marland Kitchen  PHYSICAL EXAMINATION: ECOG PERFORMANCE STATUS: 0 - Asymptomatic  Vitals:   11/29/20 0855  BP: 125/65  Pulse: 65   Resp: 18  Temp: (!) 96.1 F (35.6 C)   Filed Weights   11/29/20 0855  Weight: 242 lb (109.8 kg)    Physical Exam HENT:     Head: Normocephalic and atraumatic.     Mouth/Throat:     Pharynx: No oropharyngeal exudate.  Eyes:     Pupils: Pupils are equal, round, and reactive to light.  Cardiovascular:     Rate and Rhythm: Normal rate and regular rhythm.  Pulmonary:     Effort: Pulmonary effort is normal. No respiratory distress.     Breath sounds: Normal breath sounds. No wheezing.  Abdominal:     General: Bowel sounds are normal. There is no distension.     Palpations: Abdomen is soft. There is no mass.     Tenderness: There is no abdominal tenderness. There is no guarding or rebound.  Musculoskeletal:        General: No tenderness. Normal range of motion.     Cervical back: Normal range of motion and neck supple.  Skin:    General: Skin is warm.  Neurological:     Mental Status: He is alert and oriented to person, place, and time.  Psychiatric:        Mood and Affect: Affect normal.     LABORATORY DATA:  I have reviewed the data as listed Lab Results  Component Value Date   WBC 5.9 11/29/2020   HGB 16.3 11/29/2020   HCT 46.2 11/29/2020   MCV 82.9 11/29/2020   PLT 206 11/29/2020   Recent Labs    03/17/20 1012 06/24/20 1035 07/28/20 0922 08/19/20 0939 08/26/20 0923 11/11/20 0942 11/29/20 0843  NA 135   < > 139 142 137 142 135  K  4.0   < > 4.7 4.6 4.4 4.1 4.3  CL 102   < > 102 105 103 104 98  CO2 24   < > 19* 20 31 28 28   GLUCOSE 157*   < > 290* 236* 239* 159* 318*  BUN 17   < > 22 23 22 23  27*  CREATININE 0.69   < > 0.88 0.95 0.87 1.19 1.18  CALCIUM 8.6*   < > 9.0 8.9 8.6* 8.6 9.4  GFRNONAA >60   < > 88 81 >60  --  >60  GFRAA >60  --  101 94  --   --   --   PROT 6.5   < >  --   --  6.7 6.1 7.2  ALBUMIN 3.9   < >  --   --  3.9 4.0 4.1  AST 14*   < >  --   --  15 29 15   ALT 18   < >  --   --  17 45 22  ALKPHOS 65   < >  --   --  64 59 77  BILITOT  0.7   < >  --   --  0.7 0.6 1.3*   < > = values in this interval not displayed.    RADIOGRAPHIC STUDIES: I have personally reviewed the radiological images as listed and agreed with the findings in the report. No results found.  ASSESSMENT & PLAN:   Prostate cancer ([place]) # STAGE-IV-Prostate cancer-metastatic to bone/castrate resistant. S/P- EBRT to prostate [last Tx- on 01/08/2020]. PET Oct 10th, 2021-Mild asymmetric activity within the prostate gland decreased from comparison from Feb 2021; No convincing evidence of nodal metastasis. No visceral metastasis or skeletal metastasis. Currently off- Darolutamide [sec to poor tolerance].   April 2022- <0.01.   # Continue surveillance closely off any oral novel ADT therapies given the previous intolerance to Darolutamide.  Continue Eligard [every 6 months; 11/22; Dr. Stoioff].   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 Recommend ca+Vit D 1000 mg/day.  I left a message for patient's dentist Dr. Deatra Ina.  We will decide on Zometa as per the recommendation of the dentist.  # DISPOSITION: [phone]- Dr.Leonard Deatra Ina.  # HOLD zometa today. # follow up TBD-Dr.B -------------------------------------------------------------------------- #  Follow up in 3 months; MD- labs- cbc/cmp/PSA; possible zometa- Dr.B    All questions were answered. The patient knows to call the clinic with any problems, questions or concerns.    Cammie Sickle, MD 11/29/2020 10:04 AM

## 2020-11-30 ENCOUNTER — Telehealth: Payer: Self-pay | Admitting: Internal Medicine

## 2020-11-30 DIAGNOSIS — C61 Malignant neoplasm of prostate: Secondary | ICD-10-CM

## 2020-11-30 NOTE — Telephone Encounter (Signed)
On 4/20-I spoke to patient regarding my discussion with patient's dentist Dr. Deatra Ina.  As per Dr. Jerilynn Mages no upcoming dental extractions planned.  Okay to proceed with Zometa.   C-schedule for Zometa; lab-BMP next week  #Schedule follow-up-in 3 months MD labs-CBC BMP PSA; Zometa.  GB

## 2020-12-01 NOTE — Addendum Note (Signed)
Addended by: Delice Bison E on: 12/01/2020 08:48 AM   Modules accepted: Orders

## 2020-12-04 ENCOUNTER — Encounter: Payer: Self-pay | Admitting: Internal Medicine

## 2020-12-08 ENCOUNTER — Inpatient Hospital Stay: Payer: Medicare Other

## 2020-12-08 ENCOUNTER — Telehealth: Payer: Self-pay | Admitting: Internal Medicine

## 2020-12-08 DIAGNOSIS — Z87891 Personal history of nicotine dependence: Secondary | ICD-10-CM | POA: Diagnosis not present

## 2020-12-08 DIAGNOSIS — C61 Malignant neoplasm of prostate: Secondary | ICD-10-CM

## 2020-12-08 DIAGNOSIS — C7951 Secondary malignant neoplasm of bone: Secondary | ICD-10-CM | POA: Diagnosis not present

## 2020-12-08 DIAGNOSIS — Z79899 Other long term (current) drug therapy: Secondary | ICD-10-CM | POA: Diagnosis not present

## 2020-12-08 DIAGNOSIS — Z8042 Family history of malignant neoplasm of prostate: Secondary | ICD-10-CM | POA: Diagnosis not present

## 2020-12-08 LAB — BASIC METABOLIC PANEL
Anion gap: 8 (ref 5–15)
BUN: 23 mg/dL (ref 8–23)
CO2: 26 mmol/L (ref 22–32)
Calcium: 8.1 mg/dL — ABNORMAL LOW (ref 8.9–10.3)
Chloride: 100 mmol/L (ref 98–111)
Creatinine, Ser: 1.05 mg/dL (ref 0.61–1.24)
GFR, Estimated: >60 mL/min (ref >60)
Glucose, Bld: 360 mg/dL — ABNORMAL HIGH (ref 70–99)
Potassium: 4.5 mmol/L (ref 3.5–5.1)
Sodium: 134 mmol/L — ABNORMAL LOW (ref 135–145)

## 2020-12-08 NOTE — Progress Notes (Signed)
Calcium 8.1, pt reports that he has difficulty swallowing calcium pills at home and has not been taking them. Per Dr. Rogue Bussing hold Zometa at this time, and MD will call pt to discuss plan. Pt aware and verbalizes understanding.  Pt stable at discharge.

## 2020-12-08 NOTE — Telephone Encounter (Signed)
On 4/28-Zometa was held because calcium was 8.1.  Interestingly 7 days ago-calcium was 9.4.  Recommend at least calcium plus vitamin D 1000 units once a day.   Continue Zometa at next visit/as planned.   GB

## 2020-12-14 ENCOUNTER — Other Ambulatory Visit: Payer: Self-pay | Admitting: Internal Medicine

## 2020-12-23 ENCOUNTER — Other Ambulatory Visit: Payer: Self-pay | Admitting: Cardiovascular Disease

## 2020-12-30 ENCOUNTER — Telehealth: Payer: Self-pay | Admitting: Cardiovascular Disease

## 2020-12-30 DIAGNOSIS — I10 Essential (primary) hypertension: Secondary | ICD-10-CM

## 2020-12-30 DIAGNOSIS — R079 Chest pain, unspecified: Secondary | ICD-10-CM

## 2020-12-30 DIAGNOSIS — E785 Hyperlipidemia, unspecified: Secondary | ICD-10-CM

## 2020-12-30 DIAGNOSIS — I2510 Atherosclerotic heart disease of native coronary artery without angina pectoris: Secondary | ICD-10-CM

## 2020-12-30 MED ORDER — CARVEDILOL 3.125 MG PO TABS: ORAL_TABLET | ORAL | 3 refills | Status: DC

## 2020-12-30 NOTE — Telephone Encounter (Signed)
*  STAT* If patient is at the pharmacy, call can be transferred to refill team.   1. Which medications need to be refilled? (please list name of each medication and dose if known)    Carvedilol  3.125 po q d per pharmacy BID per Epic  2. Which pharmacy/location (including street and city if local pharmacy) is medication to be sent to? [place]   3. Do they need a 30 day or 90 day supply? 90   Patient is out of meds

## 2021-01-02 ENCOUNTER — Ambulatory Visit (INDEPENDENT_AMBULATORY_CARE_PROVIDER_SITE_OTHER): Payer: Medicare Other | Admitting: Family Medicine

## 2021-01-02 ENCOUNTER — Other Ambulatory Visit: Payer: Self-pay

## 2021-01-02 DIAGNOSIS — C61 Malignant neoplasm of prostate: Secondary | ICD-10-CM | POA: Diagnosis not present

## 2021-01-02 MED ORDER — LEUPROLIDE ACETATE (6 MONTH) 45 MG ~~LOC~~ KIT
45.0000 mg | PACK | Freq: Once | SUBCUTANEOUS | Status: AC
  Administered 2021-01-02: 45 mg via SUBCUTANEOUS

## 2021-01-02 NOTE — Addendum Note (Signed)
Addended by: Kyra Manges on: 01/02/2021 09:18 AM   Modules accepted: Level of Service

## 2021-01-02 NOTE — Progress Notes (Signed)
Eligard SubQ Injection   Due to Prostate Cancer patient is present today for a Eligard Injection.  Medication: Eligard 6 month Dose: 45 mg  Location: right  Lot: [id number] Exp: 04/2022  Patient tolerated well, no complications were noted  Performed by: Elberta Leatherwood, CMA  Patient's next follow up was scheduled for 06/26/2021. This appointment was scheduled using wheel and given to patient today along with reminder continue on Vitamin D 800-1000iu and Calium 1000-1200mg  daily while on Androgen Deprivation Therapy.  PA approval dates: NO PA required

## 2021-01-18 ENCOUNTER — Other Ambulatory Visit: Payer: Self-pay | Admitting: Internal Medicine

## 2021-01-18 NOTE — Telephone Encounter (Signed)
Last OV note says increase to 66. Please advise

## 2021-02-02 ENCOUNTER — Encounter: Payer: Self-pay | Admitting: Internal Medicine

## 2021-02-14 ENCOUNTER — Ambulatory Visit (INDEPENDENT_AMBULATORY_CARE_PROVIDER_SITE_OTHER): Payer: Medicare Other | Admitting: Cardiovascular Disease

## 2021-02-14 ENCOUNTER — Encounter: Payer: Self-pay | Admitting: Cardiovascular Disease

## 2021-02-14 ENCOUNTER — Other Ambulatory Visit: Payer: Self-pay

## 2021-02-14 VITALS — BP 128/60 | HR 65 | Ht 69.0 in | Wt 246.2 lb

## 2021-02-14 DIAGNOSIS — E785 Hyperlipidemia, unspecified: Secondary | ICD-10-CM | POA: Diagnosis not present

## 2021-02-14 DIAGNOSIS — I739 Peripheral vascular disease, unspecified: Secondary | ICD-10-CM

## 2021-02-14 DIAGNOSIS — I10 Essential (primary) hypertension: Secondary | ICD-10-CM

## 2021-02-14 DIAGNOSIS — I2510 Atherosclerotic heart disease of native coronary artery without angina pectoris: Secondary | ICD-10-CM | POA: Diagnosis not present

## 2021-02-14 DIAGNOSIS — Z789 Other specified health status: Secondary | ICD-10-CM

## 2021-02-14 MED ORDER — LOSARTAN POTASSIUM 25 MG PO TABS: 25.0000 mg | ORAL_TABLET | Freq: Every day | ORAL | 1 refills | Status: DC

## 2021-02-14 NOTE — Patient Instructions (Signed)
Medication Instructions:   Your physician has recommended you make the following change in your medication:    STOP taking your Coreg (Carvedilol).  2.    INCREASE your Losartan to 25 MG once a day.  *If you need a refill on your cardiac medications before your next appointment, please call your pharmacy*   Lab Work: None ordered If you have labs (blood work) drawn today and your tests are completely normal, you will receive your results only by: [place] (if you have MyChart) OR A paper copy in the mail If you have any lab test that is abnormal or we need to change your treatment, we will call you to review the results.   Testing/Procedures:  None ordered   Follow-Up: At [facility], you and your health needs are our priority.  As part of our continuing mission to provide you with exceptional heart care, we have created designated Provider Care Teams.  These Care Teams include your primary Cardiologist (physician) and Advanced Practice Providers (APPs -  Physician Assistants and Nurse Practitioners) who all work together to provide you with the care you need, when you need it.  We recommend signing up for the patient portal called "MyChart".  Sign up information is provided on this After Visit Summary.  MyChart is used to connect with patients for Virtual Visits (Telemedicine).  Patients are able to view lab/test results, encounter notes, upcoming appointments, etc.  Non-urgent messages can be sent to your provider as well.   To learn more about what you can do with MyChart, go to [email or web address].    Your next appointment:   6 month(s)  The format for your next appointment:   In Person  Provider:   You may see [name], MD or one of the following Advanced Practice Providers on your designated Care Team:   Murray Hodgkins, NP Christell Faith, PA-C Marrianne Mood, PA-C Cadence Columbia, [place] Laurann Montana, NP   Other Instructions

## 2021-02-14 NOTE — Progress Notes (Signed)
Cardiology Office Note   Date:  02/14/2021   ID:  Phillip Clements, DOB [birth year], MRN [id number]  PCP:  Tonia Ghent, MD  Cardiologist:   [name], MD   Chief Complaint  Patient presents with   Other    6 month f/u no complaints today. Meds reviewed verbally with pt.      History of Present Illness: Phillip Clements is a 70 y.o. male who presents for a followup visit regarding coronary artery disease .  He has known history of diabetes for at least 25 years of duration which has not been optimally controlled. He also has other medical conditions that include  Hyperlipidemia, PAD status post left SFA angioplasty in December 2016,  tobacco use, obesity and prostate cancer.  He had CABG in September 2015 for severe three-vessel coronary artery disease.  Ejection fraction was 45%.  He developed recurrent angina  in November, 2015. He underwent a nuclear stress test which was abnormal. He had inferolateral ST elevation with exercise. Nuclear imaging showed evidence of inferior as well as anterior ischemia. Repeat cardiac catheterization showed occluded grafts except for SVG to OM 2. LIMA to LAD was atretic. Ejection fraction was 55%. He underwent atherectomy and stenting of the left main and LAD . He is intolerant to statins. He is on Zetia and Repatha.    Most recent echocardiogram in September 2021 showed normal LV systolic function with calcified aortic valve without significant stenosis.  Lexiscan Myoview in December 2021 showed inferior and inferolateral infarct without significant ischemia. Lower extremity arterial Doppler studies in December 2021 showed normal ABI bilaterally.  Duplex showed patent left SFA with mildly elevated velocity.  He has been doing reasonably well with stable exertional dyspnea and no chest pain.  He reports bilateral calf discomfort after walking 100 feet which has been stable.  He does have prostate cancer on Lupron therapy but his PSA has been very  low.  He has been under stress as he wants to sell his old [place] in [place] and move to the new property he bought in [place].  Past Medical History:  Diagnosis Date   Cataract    bilateral eye in 2010   Coronary artery disease    a. CABG 04/2014: (LIMA->LAD, VG->DIAG, VG->OM2, VG->PDA)  b. 07/2014 early graft failure (VG->OM2 60, LIMA->LAD atretic, VG->PDA & VG->D1 100). s/p successful rotational atherectomy & DES of the LM & pLAD (3.0x24 Promus DES) & mLAD (2.25x24 Promus DES).   CVA (cerebral infarction)    Family history of prostate cancer    Hx of adenomatous colonic polyps 06/17/2017   Hyperlipidemia    a. Statin intolerant.   Hypertension 2000   Moderate mitral regurgitation    a. 02/2015 Echo: EF 55-60%, no rwma, mod MR, mod BAE, mildly dil RV w/ low nl RV fxn.   OSA on CPAP    Peripheral vascular disease ([place])    a. left SFA angioplasty in December 2016   Prostate cancer [facility])    Sleep apnea    wears CPAP nightly   Type II diabetes mellitus [facility])     Past Surgical History:  Procedure Laterality Date   ANTERIOR CERVICAL DECOMP/DISCECTOMY FUSION  08/25/2012   Procedure: ANTERIOR CERVICAL DECOMPRESSION/DISCECTOMY FUSION 2 LEVELS;  Surgeon: Hosie Spangle, MD;  Location: [place] [facility];  Service: Neurosurgery;  Laterality: N/A;  Cervical five-six Cervical six-seven anterior cervical decompression with fusion and plating and bonegraft   BACK SURGERY  CARDIAC CATHETERIZATION  04/2014; 07/19/2014   CORONARY ARTERY BYPASS GRAFT N/A 04/26/2014   Procedure: CORONARY ARTERY BYPASS GRAFTING (CABG), on pump, times four, using left internal mammary artery, right greater saphenous vein harvested endoscopically.;  Surgeon: Ivin Poot, MD;  Location: [place];  Service: Open Heart Surgery;  Laterality: N/A;  LIMA to LAD, SVG to DIAGONAL, SVG to OM1, SVG to PDA) with EVH of the RIGHT THIGH and LOWER EXTREMITY SAPHENOUS VEIN   Cytoscopy prostatic stone o/w nml  06/21/08    Dr. Jacqlyn Larsen   ETT myoview  09/13/09   Low risk EF 49%   High intense focused ultrasound  07/20/06   By Dr. Jacqlyn Larsen   INTRAOPERATIVE TRANSESOPHAGEAL ECHOCARDIOGRAM N/A 04/26/2014   Procedure: INTRAOPERATIVE TRANSESOPHAGEAL ECHOCARDIOGRAM;  Surgeon: Ivin Poot, MD;  Location: [place];  Service: Open Heart Surgery;  Laterality: N/A;   PERCUTANEOUS CORONARY ROTOBLATOR INTERVENTION (PCI-R) N/A 07/22/2014   Procedure: PERCUTANEOUS CORONARY ROTOBLATOR INTERVENTION (PCI-R);  Surgeon: Peter M Martinique, MD;  Location[facility] C[facility];  Service: Cardiovascular;  Laterality: N/A;   PERIPHERAL VASCULAR CATHETERIZATION Left 07/26/2015   Procedure: Lower Extremity Angiography;  Surgeon: Katha [place]al, MD;  Location: [facility] CV LAB;  Service: Cardiovascular;  Laterality: Left;   PERIPHERAL VASCULAR CATHETERIZATION  07/26/2015   Procedure: Lower Extremity Intervention;  Surgeon: Katha [place], MD;  Location: [place] CV LAB;  Service: Cardiovascular;;   PROSTATE BIOPSY  04/03/06 & 02/25/07     Current Outpatient Medications  Medication Sig Dispense Refill   aspirin EC 81 MG tablet Take 1 tablet (81 mg total) by mouth daily. 90 tablet 3   carvedilol (COREG) 3.125 MG tablet TAKE 1 TABLET BY MOUTH TWICE A DAY WITH A MEAL 180 tablet 3   clopidogrel (PLAVIX) 75 MG tablet TAKE 1 TABLET BY MOUTH ONCE DAILY 30 tablet 2   Dulaglutide (TRULICITY) 4.5 JX/9.1YN SOPN Inject 4.5 mg as directed once a week. 6 mL 3   Evolocumab (REPATHA SURECLICK) 829 MG/ML SOAJ INJECT ONE PEN INTO THE SKIN EVERY 14 DAYS 2 mL 5   ezetimibe (ZETIA) 10 MG tablet Take 1 tablet (10 mg total) by mouth daily. 30 tablet 7   furosemide (LASIX) 20 MG tablet Take 1 tablet (20 mg total) by mouth daily. 90 tablet 1   glucose blood (ONETOUCH ULTRA) test strip CHECK BLOOD SUGAR TWICE A DAY - DX E11.59 200 strip 3   Injection Device (CORNWALL METAL PIPETTING) MISC Frequency:PHARMDIR   Dosage:0.0     Instructions:  Note:diagnosis 250.02 Dose: NA      insulin aspart (NOVOLOG FLEXPEN) 100 UNIT/ML FlexPen Inject 16-24 Units into the skin daily before supper. 30 mL 11   insulin degludec (TRESIBA FLEXTOUCH) 200 UNIT/ML FlexTouch Pen Inject 86 Units into the skin daily. 18 mL 3   Insulin Pen Needle 32G X 4 MM MISC Use 1x a day 100 each 3   INVOKANA 300 MG TABS tablet TAKE 1 TABLET BY MOUTH ONCE A DAY WITH BREAKFAST 90 tablet 3   leuprolide (LUPRON) 30 MG injection Inject 45 mg into the muscle every 6 (six) months.     losartan (COZAAR) 25 MG tablet TAKE 1/2 TABLET BY MOUTH ONCE A DAY 45 tablet 1   meclizine (ANTIVERT) 25 MG tablet Take 0.5-1 tablets (12.5-25 mg total) by mouth 2 (two) times daily as needed for dizziness or nausea. 30 tablet 0   mirabegron ER (MYRBETRIQ) 50 MG TB24 tablet Take 1 tablet (50 mg total) by mouth daily.  30 tablet 12   nitroGLYCERIN (NITROSTAT) 0.4 MG SL tablet Place 1 tablet (0.4 mg total) under the tongue as needed. 25 tablet 2   tamsulosin (FLOMAX) 0.4 MG CAPS capsule TAKE 1 CAPSULE BY MOUTH ONCE DAILY AFTERSUPPER 30 capsule 11   No current facility-administered medications for this visit.    Allergies:   Coreg [carvedilol], Metformin and related, Protonix [pantoprazole sodium], Rosuvastatin calcium, and Statins    Social History:  The patient  reports that he quit smoking about 6 years ago. His smoking use included cigarettes. He has a 36.00 pack-year smoking history. He has never used smokeless tobacco. He reports current alcohol use. He reports that he does not use drugs.   Family History:  The patient's family history includes Coronary artery disease in his father and mother; Diabetes in his brother, father, and sister; Diabetes (age of onset: 74) in his mother; Heart attack in his brother, father, maternal grandfather, and mother; Heart disease in his brother, father, and mother; Hypertension in his father and mother; Prostate cancer (age of onset: 49) in his paternal grandfather.    ROS:  Please see the history  of present illness.   Otherwise, review of systems are positive for none.   All other systems are reviewed and negative.    PHYSICAL EXAM: VS:  BP 128/60 (BP Location: Left Arm, Patient Position: Sitting, Cuff Size: Large)   Pulse 65   Ht 5\' 9"  (1.753 m)   Wt 246 lb 4 oz (111.7 kg)   SpO2 98%   BMI 36.36 kg/m  , BMI Body mass index is 36.36 kg/m. GEN: Well nourished, well developed, in no acute distress  HEENT: normal  Neck: no JVD, carotid bruits, or masses Cardiac: RRR; no rubs, or gallops, trace edema .  1 out of 6 systolic murmur in the aortic area Respiratory:  clear to auscultation bilaterally, normal work of breathing GI: soft, nontender, nondistended, + BS MS: no deformity or atrophy  Skin: warm and dry, no rash Neuro:  Strength and sensation are intact Psych: euthymic mood, full affect   EKG:  EKG is ordered today. The ekg ordered today demonstrates  normal sinus rhythm with old inferior infarct.  Lateral T wave changes suggestive of ischemia.   Recent Labs: 08/19/2020: BNP 38.0 11/29/2020: ALT 22; Hemoglobin 16.3; Platelets 206 12/08/2020: BUN 23; Creatinine, Ser 1.05; Potassium 4.5; Sodium 134    Lipid Panel    Component Value Date/Time   CHOL 166 10/09/2019 0952   CHOL 97 (L) 09/24/2016 0855   TRIG 138 10/09/2019 0952   HDL 42 10/09/2019 0952   HDL 38 (L) 09/24/2016 0855   CHOLHDL 4.0 10/09/2019 0952   VLDL 28 10/09/2019 0952   LDLCALC 96 10/09/2019 0952   LDLCALC 37 09/24/2016 0855   LDLDIRECT 157.1 09/07/2013 0910      Wt Readings from Last 3 Encounters:  02/14/21 246 lb 4 oz (111.7 kg)  11/29/20 242 lb (109.8 kg)  11/28/20 246 lb (111.6 kg)        ASSESSMENT AND PLAN:  1.  Coronary artery disease involving graft disease with other forms of angina: He has stable exertional dyspnea overall with no significant change over the last year.  Most recent nuclear stress test last year showed no significant ischemia.  Continue medical therapy.  Continue  dual antiplatelet therapy indefinitely given left main stent into the LAD.    2. Peripheral arterial disease: He continues to describe bilateral calf discomfort with walking.  However, most  recent ABI was normal.  In addition, he reports that he had no significant improvement in symptoms when he had previous SFA angioplasty in 2016.  Suspect that he might have another etiology.  3. Hyperlipidemia: He is intolerant to statins.  Continue treatment with Repatha and Zetia.  4. Essential hypertension: Blood pressure is well controlled but he is asking to simplify his medications.  He does not need to be on both carvedilol and lisinopril.  I discontinued small dose carvedilol today and increase losartan to 25 mg once daily in order to simplify his medications.    Disposition:   FU with me in 6 months  Signed,  [name], MD  02/14/2021 3:22 PM    [facility]

## 2021-02-28 ENCOUNTER — Encounter: Payer: Self-pay | Admitting: Internal Medicine

## 2021-02-28 ENCOUNTER — Other Ambulatory Visit: Payer: Self-pay

## 2021-02-28 ENCOUNTER — Telehealth: Payer: Self-pay

## 2021-02-28 ENCOUNTER — Ambulatory Visit (INDEPENDENT_AMBULATORY_CARE_PROVIDER_SITE_OTHER): Payer: Medicare Other | Admitting: Internal Medicine

## 2021-02-28 VITALS — BP 130/70 | HR 63 | Ht 69.0 in | Wt 250.0 lb

## 2021-02-28 VITALS — BP 120/70 | HR 62 | Ht 69.0 in | Wt 250.0 lb

## 2021-02-28 DIAGNOSIS — Z6839 Body mass index (BMI) 39.0-39.9, adult: Secondary | ICD-10-CM | POA: Diagnosis not present

## 2021-02-28 DIAGNOSIS — E119 Type 2 diabetes mellitus without complications: Secondary | ICD-10-CM | POA: Diagnosis not present

## 2021-02-28 DIAGNOSIS — E1159 Type 2 diabetes mellitus with other circulatory complications: Secondary | ICD-10-CM

## 2021-02-28 DIAGNOSIS — Z860101 Personal history of adenomatous and serrated colon polyps: Secondary | ICD-10-CM

## 2021-02-28 DIAGNOSIS — E785 Hyperlipidemia, unspecified: Secondary | ICD-10-CM | POA: Diagnosis not present

## 2021-02-28 DIAGNOSIS — Z7902 Long term (current) use of antithrombotics/antiplatelets: Secondary | ICD-10-CM | POA: Diagnosis not present

## 2021-02-28 DIAGNOSIS — I2510 Atherosclerotic heart disease of native coronary artery without angina pectoris: Secondary | ICD-10-CM

## 2021-02-28 DIAGNOSIS — Z794 Long term (current) use of insulin: Secondary | ICD-10-CM | POA: Diagnosis not present

## 2021-02-28 DIAGNOSIS — Z86010 Personal history of colonic polyps: Secondary | ICD-10-CM | POA: Diagnosis not present

## 2021-02-28 DIAGNOSIS — E1165 Type 2 diabetes mellitus with hyperglycemia: Secondary | ICD-10-CM | POA: Diagnosis not present

## 2021-02-28 LAB — POCT GLYCOSYLATED HEMOGLOBIN (HGB A1C): Hemoglobin A1C: 9.8 % — AB (ref 4.0–5.6)

## 2021-02-28 MED ORDER — TRULICITY 1.5 MG/0.5ML ~~LOC~~ SOAJ: 1.5000 mg | SUBCUTANEOUS | 11 refills | Status: DC

## 2021-02-28 MED ORDER — NOVOLOG FLEXPEN 100 UNIT/ML ~~LOC~~ SOPN: 16.0000 [IU] | PEN_INJECTOR | Freq: Every day | SUBCUTANEOUS | 3 refills | Status: DC

## 2021-02-28 NOTE — Patient Instructions (Addendum)
If you are age 70 or older, your body mass index should be between 23-30. Your Body mass index is 36.92 kg/m. If this is out of the aforementioned range listed, please consider follow up with your Primary Care Provider.  __________________________________________________________  The [facility] providers would like to encourage you to use [facility] to communicate with providers for non-urgent requests or questions.  Due to long hold times on the telephone, sending your provider a message by [facility] may be a faster and more efficient way to get a response.  Please allow 48 business hours for a response.  Please remember that this is for non-urgent requests.   You have been scheduled for a colonoscopy. Please follow written instructions given to you at your visit today.  Please pick up your prep supplies at the pharmacy within the next 1-3 days. If you use inhalers (even only as needed), please bring them with you on the day of your procedure.  You will be contacted by our office prior to your procedure for directions on holding your Plavix.  If you do not hear from our office 1 week prior to your scheduled procedure, please call [phone] to discuss.    I appreciate the opportunity to care for you. Silvano Rusk, MD, [facility]

## 2021-02-28 NOTE — Telephone Encounter (Signed)
   Primary Cardiologist: [name], MD  Chart reviewed as part of pre-operative protocol coverage. Given past medical history and time since last visit, based on ACC/AHA guidelines, CALLOWAY ANDRUS would be at acceptable risk for the planned procedure without further cardiovascular testing.   He may hold his Plavix 5 days prior to his procedure.  Please resume Plavix as soon as hemostasis is achieved.  He will need to continue his aspirin throughout his procedure.    I will route this recommendation to the requesting party via Epic fax function and remove from pre-op pool.  Please call with questions.  Jossie Ng. Taneia Mealor NP-C    02/28/2021, 11:50 AM [place] [address] Office [phone] Fax [phone]

## 2021-02-28 NOTE — Telephone Encounter (Signed)
[facility] Pre-operative Risk Assessment     Request for surgical clearance:     Endoscopy Procedure  What type of surgery is being performed?     colonoscopy  When is this surgery scheduled?     03/31/21  What type of clearance is required ?   Pharmacy  Are there any medications that need to be held prior to surgery and how long? Plavix, 5 days  Practice name and name of physician performing surgery?      [facility]  What is your office phone and fax number?      Phone- [phone]  [phone]  Anesthesia type (None, local, MAC, general) ?       MAC

## 2021-02-28 NOTE — Patient Instructions (Addendum)
Please continue: - Invokana  300 mg bfore b'fast - Tresiba 86 units daily   Restart: - Trulicity 1.5 mg weekly  Try to take: - NovoLog 16-24 units 15 min before your main meals  Please return in 3-4 months with your sugar log.

## 2021-02-28 NOTE — Progress Notes (Signed)
Patient ID: Phillip Clements, male   DOB: July 16, 1951, 70 y.o.   MRN: [id number]  This visit occurred during the SARS-CoV-2 public health emergency.  Safety protocols were in place, including screening questions prior to the visit, additional usage of staff PPE, and extensive cleaning of exam room while observing appropriate contact time as indicated for disinfecting solutions.   HPI: Phillip Clements is a 70 y.o.-year-old male, returning for f/u for DM2, dx in 1996, insulin-dependent since 2008, uncontrolled, with complications (CAD - s/p stents, CABG, cerebro-vascular ds - s/p CVA, PVD). Last visit 4 months ago. He changed to [facility] in 10/2016.  Interim history: No increased urination, blurry vision, nausea, chest pain.  He relaxed his diet since last OV. He had more stress. He has dysphagia. Had an EGD and will have a colonoscopy soon.  Reviewed HbA1c levels: Lab Results  Component Value Date   HGBA1C 8.2 (A) 11/01/2020   HGBA1C 8.2 (A) 07/01/2020   HGBA1C 8.2 (A) 02/25/2020   Pt was on a regimen of: - Invokana 300 mg before b'fast - Trulicity 1.5 mg weekly - Novolog 40 units 3x a day - 15 min before meals - Toujeo 120  units (60 x2) after dinner He tried Victoza and Januvia. Had GI intolerance (nausea, diarrhea) to regular Metformin and also with metformin ER >>stopped.  He was then on: - Invokana 300 mg before breakfast - Trulicity 1.5 mg weekly - U500 insulin 55-70 units 3 times a day before meals Also, if sugars in the morning are: - 160-200, take 10 units - 201-240, take 15 units - >240, take 20 units  He is currently on: - Invokana 300 mg bfore b'fast - Trulicity 1.5 >> 3  mg weekly  - stopped 2/2 diarrhea, nausea - Tresiba 32 >> 44 units daily-started 10/2018 >> 66 >> 86 units daily - NovoLog 10-16 >> 16-24 >> 20 units  (actually takes if after dinner!) - added 06/2020  He is checking sugars once a day: - am: 119, 150-200 >> n/c >> 115-195, 226 >> 168-263 >>  123-210 - 2h after b'fast: n/c  - before lunch: 100-120 >> n/c >> 126-166 >> n/c - 2h after lunch: n/c >> 170 - before dinner: 102 >> 110-169, 191 >> 120-311 (snack) - 2h after dinner: n/c >> 280 >> 224-269 >> 297, 367 >> 180s - bedtime: 201, 214 >> 128-150 >> n/c - nighttime: n/c Lowest sugar was 56 (on insulin) >> ... >>  102 >> 110 >> 120; he has hypoglycemia awareness in the 70s. Highest sugar was  445 >> .Marland KitchenMarland Kitchen 300 x 1 >> 367 >> 300s.  Glucometer: OneTouch Ultra mini  Pt's meals are mostly plant-based: - Breakfast:  Skips >> PB and apple - Lunch: sandwich, chips, cottage cheese, pineapple >> skips - Dinner: 2 veggies, salads; Sat night: meat >> grilled chicken and salad - Snacks: 2/day: celery + PB; low salt triscuit + laughing cow cheese  He continues to walk on the treadmill 2 out of 7 days at the gym and working outside.  + Mild CKD, last BUN/creatinine:  Lab Results  Component Value Date   BUN 23 12/08/2020   CREATININE 1.05 12/08/2020  On losartan. -+ HL: Lab Results  Component Value Date   CHOL 166 10/09/2019   HDL 42 10/09/2019   LDLCALC 96 10/09/2019   LDLDIRECT 157.1 09/07/2013   TRIG 138 10/09/2019   CHOLHDL 4.0 10/09/2019  He could not tolerate statins due to joint pains.  He continues  on Repatha and Zetia.  - last eye exam was in 05/2020: No DR reportedly; + history of cataract surgery. [place].  - no Numbness and tingling in his feet.  He has prostate cancer, believed to be metastatic to the bones.  On Lupron.  He finished radiation therapy.  ROS: Constitutional: no weight gain/no weight loss, + fatigue with exertion, no subjective hyperthermia, no subjective hypothermia Eyes: no blurry vision, no xerophthalmia ENT: no sore throat, + see HPI Cardiovascular: no CP/no SOB/no palpitations/no leg swelling Respiratory: no cough/no SOB/no wheezing Gastrointestinal: no N/no V/no D/no C/no acid reflux Musculoskeletal: no muscle aches/no joint  aches Skin: no rashes, no hair loss Neurological: no tremors/no numbness/no tingling/no dizziness  I reviewed pt's medications, allergies, PMH, social hx, family hx, and changes were documented in the history of present illness. Otherwise, unchanged from my initial visit note.  Past Medical History:  Diagnosis Date   Cataract    bilateral eye in 2010   Coronary artery disease    a. CABG 04/2014: (LIMA->LAD, VG->DIAG, VG->OM2, VG->PDA)  b. 07/2014 early graft failure (VG->OM2 60, LIMA->LAD atretic, VG->PDA & VG->D1 100). s/p successful rotational atherectomy & DES of the LM & pLAD (3.0x24 Promus DES) & mLAD (2.25x24 Promus DES).   CVA (cerebral infarction)    Family history of prostate cancer    Hx of adenomatous colonic polyps 06/17/2017   Hyperlipidemia    a. Statin intolerant.   Hypertension 2000   Moderate mitral regurgitation    a. 02/2015 Echo: EF 55-60%, no rwma, mod MR, mod BAE, mildly dil RV w/ low nl RV fxn.   OSA on CPAP    Peripheral vascular disease ([place])    a. left SFA angioplasty in December 2016   Prostate cancer [facility])    Sleep apnea    wears CPAP nightly   Type II diabetes mellitus [facility])    Past Surgical History:  Procedure Laterality Date   ANTERIOR CERVICAL DECOMP/DISCECTOMY FUSION  08/25/2012   Procedure: ANTERIOR CERVICAL DECOMPRESSION/DISCECTOMY FUSION 2 LEVELS;  Surgeon: Phillip Spangle, MD;  Location: [place] NEURO ORS;  Service: Neurosurgery;  Laterality: N/A;  Cervical five-six Cervical six-seven anterior cervical decompression with fusion and plating and bonegraft   BACK SURGERY     CARDIAC CATHETERIZATION  04/2014; 07/19/2014   CORONARY ARTERY BYPASS GRAFT N/A 04/26/2014   Procedure: CORONARY ARTERY BYPASS GRAFTING (CABG), on pump, times four, using left internal mammary artery, right greater saphenous vein harvested endoscopically.;  Surgeon: Phillip Poot, MD;  Location: [place];  Service: Open Heart Surgery;  Laterality: N/A;  LIMA to LAD, SVG to DIAGONAL, SVG  to OM1, SVG to PDA) with EVH of the RIGHT THIGH and LOWER EXTREMITY SAPHENOUS VEIN   Cytoscopy prostatic stone o/w nml  06/21/08   Dr. Jacqlyn Clements   ETT myoview  09/13/09   Low risk EF 49%   High intense focused ultrasound  07/20/06   By Dr. Jacqlyn Clements   INTRAOPERATIVE TRANSESOPHAGEAL ECHOCARDIOGRAM N/A 04/26/2014   Procedure: INTRAOPERATIVE TRANSESOPHAGEAL ECHOCARDIOGRAM;  Surgeon: Phillip Poot, MD;  Location: [place];  Service: Open Heart Surgery;  Laterality: N/A;   PERCUTANEOUS CORONARY ROTOBLATOR INTERVENTION (PCI-R) N/A 07/22/2014   Procedure: PERCUTANEOUS CORONARY ROTOBLATOR INTERVENTION (PCI-R);  Surgeon: Peter M Martinique, MD;  Location: [facility] CATH LAB;  Service: Cardiovascular;  Laterality: N/A;   PERIPHERAL VASCULAR CATHETERIZATION Left 07/26/2015   Procedure: Lower Extremity Angiography;  Surgeon: Katha Cabal, MD;  Location: [place] CV LAB;  Service: Cardiovascular;  Laterality:  Left;   PERIPHERAL VASCULAR CATHETERIZATION  07/26/2015   Procedure: Lower Extremity Intervention;  Surgeon: Katha Cabal, MD;  Location: [place] CV LAB;  Service: Cardiovascular;;   PROSTATE BIOPSY  04/03/06 & 02/25/07   Social History   Socioeconomic History   Marital status: Married    Spouse name: Not on file   Number of children: 3   Years of education: Not on file   Highest education level: Not on file  Occupational History   Occupation: Scientist, 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  Tobacco Use   Smoking status: Former    Packs/day: 1.00    Years: 36.00    Pack years: 36.00    Types: Cigarettes    Quit date: 03/01/2014    Years since quitting: 7.0   Smokeless tobacco: Never  Vaping Use   Vaping Use: Never used  Substance and Sexual Activity   Alcohol use: Yes    Comment: 07/19/2014 "might have a drink a couple times/yr"   Drug use: No   Sexual activity: Not Currently  Other Topics Concern   Not on file  Social History Narrative   Lives with wife.; 2 daughters and 1 son.[employer] fan;  semi-retd; Arboriculturist; Former Education officer, community, 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 In [place].    Social Determinants of Health   Financial Resource Strain: Not on file  Food Insecurity: Not on file  Transportation Needs: Not on file  Physical Activity: Not on file  Stress: Not on file  Social Connections: Not on file  Intimate Partner Violence: Not on file   Current Outpatient Medications on File Prior to Visit  Medication Sig Dispense Refill   aspirin EC 81 MG tablet Take 1 tablet (81 mg total) by mouth daily. 90 tablet 3   clopidogrel (PLAVIX) 75 MG tablet TAKE 1 TABLET BY MOUTH ONCE DAILY 30 tablet 2   Dulaglutide (TRULICITY) 4.5 IE/3.3IR SOPN Inject 4.5 mg as directed once a week. 6 mL 3   Evolocumab (REPATHA SURECLICK) 518 MG/ML SOAJ INJECT ONE PEN INTO THE SKIN EVERY 14 DAYS 2 mL 5   ezetimibe (ZETIA) 10 MG tablet Take 1 tablet (10 mg total) by mouth daily. 30 tablet 7   furosemide (LASIX) 20 MG tablet Take 1 tablet (20 mg total) by mouth daily. 90 tablet 1   glucose blood (ONETOUCH ULTRA) test strip CHECK BLOOD SUGAR TWICE A DAY - DX E11.59 200 strip 3   Injection Device (CORNWALL METAL PIPETTING) MISC Frequency:PHARMDIR   Dosage:0.0     Instructions:  Note:diagnosis 250.02 Dose: NA     insulin aspart (NOVOLOG FLEXPEN) 100 UNIT/ML FlexPen Inject 16-24 Units into the skin daily before supper. 30 mL 11   insulin degludec (TRESIBA FLEXTOUCH) 200 UNIT/ML FlexTouch Pen Inject 86 Units into the skin daily. 18 mL 3   Insulin Pen Needle 32G X 4 MM MISC Use 1x a day 100 each 3   INVOKANA 300 MG TABS tablet TAKE 1 TABLET BY MOUTH ONCE A DAY WITH BREAKFAST 90 tablet 3   leuprolide (LUPRON) 30 MG injection Inject 45 mg into the muscle every 6 (six) months.     losartan (COZAAR) 25 MG tablet Take 1 tablet (25 mg total) by mouth daily. 90 tablet 1   meclizine (ANTIVERT) 25 MG tablet Take 0.5-1 tablets (12.5-25 mg total) by  mouth 2 (two) times daily as needed for dizziness or nausea. 30 tablet 0   mirabegron ER (MYRBETRIQ)  50 MG TB24 tablet Take 1 tablet (50 mg total) by mouth daily. 30 tablet 12   nitroGLYCERIN (NITROSTAT) 0.4 MG SL tablet Place 1 tablet (0.4 mg total) under the tongue as needed. 25 tablet 2   tamsulosin (FLOMAX) 0.4 MG CAPS capsule TAKE 1 CAPSULE BY MOUTH ONCE DAILY AFTERSUPPER 30 capsule 11   No current facility-administered medications on file prior to visit.   Allergies  Allergen Reactions   Coreg [Carvedilol] Other (See Comments)    Fatigue- unclear if this was due to coreg specifically or beta blockers in general.     Metformin And Related Nausea Only   Protonix [Pantoprazole Sodium] Diarrhea   Rosuvastatin Calcium     REACTION: JOINT ACHES   Statins     REACTION: JOINT ACHES   Family History  Problem Relation Age of Onset   Diabetes Mother 30       DM   Coronary artery disease Mother    Hypertension Mother    Heart attack Mother        CABG with MI in 2005   Heart disease Mother        CV   Hypertension Father    Heart attack Father        CABG with Mi around 1997   Coronary artery disease Father    Heart disease Father        CV   Diabetes Father    Heart attack Brother        MI/PTCA   Heart disease Brother        CV   Diabetes Brother    Heart attack Maternal Grandfather        MI   Prostate cancer Paternal Grandfather 41       likely metastatic   Diabetes Sister        DM   Breast cancer Neg Hx        Breast/ovarian/uterine cancer   Colon cancer Neg Hx    Depression Neg Hx    Alcohol abuse Neg Hx        ETOH/drug abuse   Stroke Neg Hx    PE: BP 130/70 (BP Location: Right Arm, Patient Position: Sitting, Cuff Size: Normal)   Pulse 63   Ht 5\' 9"  (1.753 m)   Wt 250 lb (113.4 kg)   SpO2 99%   BMI 36.92 kg/m  Body mass index is 36.92 kg/m. Wt Readings from Last 3 Encounters:  02/28/21 250 lb (113.4 kg)  02/28/21 250 lb (113.4 kg)  02/14/21 246 lb 4  oz (111.7 kg)   Constitutional: overweight, in NAD Eyes: PERRLA, EOMI, no exophthalmos ENT: moist mucous membranes, no thyromegaly, no cervical lymphadenopathy Cardiovascular: RRR, No MRG Respiratory: CTA B Gastrointestinal: abdomen soft, NT, ND, BS+ Musculoskeletal: no deformities, strength intact in all 4 Skin: moist, warm, no rashes Neurological: no tremor with outstretched hands, DTR normal in all 4  ASSESSMENT: 1. DM2, insulin-dependent, uncontrolled, with complications - CAD - s/p stents, CABG - cerebro-vascular ds - s/p CVA  - PVD - s/p L stent  2. HL  3.  Obesity class II BMI Classification: < 18.5 underweight  18.5-24.9 normal weight  25.0-29.9 overweight  30.0-34.9 class I obesity  35.0-39.9 class II obesity  ? 40.0 class III obesity   PLAN:  1. Patient with longstanding, insulin resistant diabetes, previously on U500 insulin, but then off insulin after improvement of his sugars while on Invokana and Trulicity.  In 2021, he relaxed his diet, gained  a significant amount of weight and sugars worsened so we had to start back on insulin.  Sugars were still high in the morning at last visit, slightly worse.  Sugars after dinner are also very high, in the upper 200s or even 300s, despite taking NovoLog before dinner.  We increased his NovoLog dose and I advised him to increase Trulicity.  HbA1c at that time was stable, at 8.2%. -At today's visit, he is off Trulicity after he developed GI symptoms (nausea, diarrhea) in 11/2020.  He tells me that he did not increase the dose of insulin at the time that he developed symptoms, and he was still taking the 3 mg weekly.  He could not tolerate the 1.5 mg weekly well, though. -Since last visit, increase the dose of Tresiba.  He is taking a higher dose of NovoLog, 20 units, but he is taking this after dinner, which is not optimal.  I advised him to try to move this 15 minutes before dinner. -Reviewing his blood sugars at home, they are  quite high, almost all above target.  Therefore, I advised him to try to restart Trulicity at the previously tolerated dose of 1.5 mg weekly and will also try to vary the dose of NovoLog more and take it correctly, before the meal.  We discussed that if the sugars do not improve afterwards, he can start taking NovoLog before all of his main meals. - I suggested to: Patient Instructions  Please continue: - Invokana  300 mg bfore b'fast - Tresiba 86 units daily   Restart: - Trulicity 1.5 mg weekly  Try to take: - NovoLog 16-24 units 15 min before your main meals  Please return in 3-4 months with your sugar log.   - we checked his HbA1c: 9.8% (higher) - advised to check sugars at different times of the day - 2x a day, rotating check times - advised for yearly eye exams >> he is UTD - return to clinic in 3-4 months  2. HL -Reviewed latest lipid panel from 09/2019: Fractions at goal except for LDL higher than goal of less than 70: Lab Results  Component Value Date   CHOL 166 10/09/2019   HDL 42 10/09/2019   LDLCALC 96 10/09/2019   LDLDIRECT 157.1 09/07/2013   TRIG 138 10/09/2019   CHOLHDL 4.0 10/09/2019  -He is intolerant to statins.  He continues Repatha and ezetimibe 10 mg daily without side effects  3. Obesity -Also contributed to by the Lupron injections -Previously lost a significant amount of weight (35 pounds) on a keto diet.  Weight fluctuating afterwards, but then he gained 20 pounds.  He gained 3 pounds since last visit -continue SGLT 2 inhibitor and will restart the GLP-1 receptor agonist which should also help with weight loss  Philemon Kingdom, MD PhD [facility] Endocrinology

## 2021-02-28 NOTE — Progress Notes (Signed)
Phillip Clements 70 y.o. Dec 28, 1950 [id number]  Assessment & Plan:   Encounter Diagnoses  Name Primary?   Hx of adenomatous colonic polyps Yes   Long term current use of antithrombotics/antiplatelets-clopidogrel    Coronary artery disease involving native coronary artery of native heart without angina pectoris    Type 2 diabetes mellitus with insulin therapy ([place])     Schedule colonoscopy.  Hold clopidogrel 5 days prior to procedure but continue aspirin.  I have explained the rare but real risk of vascular thrombotic event when off clopidogrel.  However we need to mitigate the increased bleeding risk on this medication.  We will clarify with cardiology who prescribes it.  Other typical endoscopy risks reviewed as well.  We will also modify his insulin regimen.  I appreciate the opportunity to care for this patient.  Subjective:   Chief Complaint: Polyp surveillance due for colonoscopy on clopidogrel  HPI  70 year old white man with multiple medical problems attributable to metabolic syndrome including coronary artery disease and diabetes mellitus here for follow-up because of previous adenomatous colonic polyps.  He takes clopidogrel with a history of peripheral vascular disease, stroke and coronary artery disease.  He is also on high-dose insulin.  He denies any active GI symptoms.  No recent cardiac or vascular events either. Colonoscopy 06/11/2017 with 10 polyps 4 to 12 mm mostly adenomatous some hyperplastic  EGD same day  LA Grade A reflux esophagitis. - One non-bleeding duodenal ulcer with no stigmata of bleeding. - Duodenitis. - Gastritis. Biopsied.  CLOtest negative - The examination was otherwise normal Allergies  Allergen Reactions   Coreg [Carvedilol] Other (See Comments)    Fatigue- unclear if this was due to coreg specifically or beta blockers in general.     Metformin And Related Nausea Only   Protonix [Pantoprazole Sodium] Diarrhea   Rosuvastatin Calcium      REACTION: JOINT ACHES   Statins     REACTION: JOINT ACHES   Current Meds  Medication Sig   aspirin EC 81 MG tablet Take 1 tablet (81 mg total) by mouth daily.   clopidogrel (PLAVIX) 75 MG tablet TAKE 1 TABLET BY MOUTH ONCE DAILY   Dulaglutide (TRULICITY) 4.5 OB/0.9GG SOPN Inject 4.5 mg as directed once a week.   Evolocumab (REPATHA SURECLICK) 836 MG/ML SOAJ INJECT ONE PEN INTO THE SKIN EVERY 14 DAYS   ezetimibe (ZETIA) 10 MG tablet Take 1 tablet (10 mg total) by mouth daily.   glucose blood (ONETOUCH ULTRA) test strip CHECK BLOOD SUGAR TWICE A DAY - DX E11.59   Injection Device (CORNWALL METAL PIPETTING) MISC Frequency:PHARMDIR   Dosage:0.0     Instructions:  Note:diagnosis 250.02 Dose: NA   insulin aspart (NOVOLOG FLEXPEN) 100 UNIT/ML FlexPen Inject 16-24 Units into the skin daily before supper.   insulin degludec (TRESIBA FLEXTOUCH) 200 UNIT/ML FlexTouch Pen Inject 86 Units into the skin daily.   Insulin Pen Needle 32G X 4 MM MISC Use 1x a day   INVOKANA 300 MG TABS tablet TAKE 1 TABLET BY MOUTH ONCE A DAY WITH BREAKFAST   leuprolide (LUPRON) 30 MG injection Inject 45 mg into the muscle every 6 (six) months.   losartan (COZAAR) 25 MG tablet Take 1 tablet (25 mg total) by mouth daily.   meclizine (ANTIVERT) 25 MG tablet Take 0.5-1 tablets (12.5-25 mg total) by mouth 2 (two) times daily as needed for dizziness or nausea.   mirabegron ER (MYRBETRIQ) 50 MG TB24 tablet Take 1 tablet (50 mg total) by  mouth daily.   nitroGLYCERIN (NITROSTAT) 0.4 MG SL tablet Place 1 tablet (0.4 mg total) under the tongue as needed.   tamsulosin (FLOMAX) 0.4 MG CAPS capsule TAKE 1 CAPSULE BY MOUTH ONCE DAILY AFTERSUPPER   [DISCONTINUED] furosemide (LASIX) 20 MG tablet Take 1 tablet (20 mg total) by mouth daily.   Past Medical History:  Diagnosis Date   Cataract    bilateral eye in 2010   Coronary artery disease    a. CABG 04/2014: (LIMA->LAD, VG->DIAG, VG->OM2, VG->PDA)  b. 07/2014 early graft failure  (VG->OM2 60, LIMA->LAD atretic, VG->PDA & VG->D1 100). s/p successful rotational atherectomy & DES of the LM & pLAD (3.0x24 Promus DES) & mLAD (2.25x24 Promus DES).   CVA (cerebral infarction)    Family history of prostate cancer    Hx of adenomatous colonic polyps 06/17/2017   Hyperlipidemia    a. Statin intolerant.   Hypertension 2000   Moderate mitral regurgitation    a. 02/2015 Echo: EF 55-60%, no rwma, mod MR, mod BAE, mildly dil RV w/ low nl RV fxn.   OSA on CPAP    Peripheral vascular disease ([place])    a. left SFA angioplasty in December 2016   Prostate cancer [facility])    Sleep apnea    wears CPAP nightly   Type II diabetes mellitus [facility])    Past Surgical History:  Procedure Laterality Date   ANTERIOR CERVICAL DECOMP/DISCECTOMY FUSION  08/25/2012   Procedure: ANTERIOR CERVICAL DECOMPRESSION/DISCECTOMY FUSION 2 LEVELS;  Surgeon: Hosie Spangle, MD;  Location: [place] NEURO ORS;  Service: Neurosurgery;  Laterality: N/A;  Cervical five-six Cervical six-seven anterior cervical decompression with fusion and plating and bonegraft   BACK SURGERY     CARDIAC CATHETERIZATION  04/2014; 07/19/2014   CORONARY ARTERY BYPASS GRAFT N/A 04/26/2014   Procedure: CORONARY ARTERY BYPASS GRAFTING (CABG), on pump, times four, using left internal mammary artery, right greater saphenous vein harvested endoscopically.;  Surgeon: Ivin Poot, MD;  Location: [place];  Service: Open Heart Surgery;  Laterality: N/A;  LIMA to LAD, SVG to DIAGONAL, SVG to OM1, SVG to PDA) with EVH of the RIGHT THIGH and LOWER EXTREMITY SAPHENOUS VEIN   Cytoscopy prostatic stone o/w nml  06/21/08   Dr. Jacqlyn Larsen   ETT myoview  09/13/09   Low risk EF 49%   High intense focused ultrasound  07/20/06   By Dr. Jacqlyn Larsen   INTRAOPERATIVE TRANSESOPHAGEAL ECHOCARDIOGRAM N/A 04/26/2014   Procedure: INTRAOPERATIVE TRANSESOPHAGEAL ECHOCARDIOGRAM;  Surgeon: Ivin Poot, MD;  Location: [place];  Service: Open Heart Surgery;  Laterality: N/A;   PERCUTANEOUS  CORONARY ROTOBLATOR INTERVENTION (PCI-R) N/A 07/22/2014   Procedure: PERCUTANEOUS CORONARY ROTOBLATOR INTERVENTION (PCI-R);  Surgeon: Peter M Martinique, MD;  Location: [facility] CATH LAB;  Service: Cardiovascular;  Laterality: N/A;   PERIPHERAL VASCULAR CATHETERIZATION Left 07/26/2015   Procedure: Lower Extremity Angiography;  Surgeon: Katha Cabal, MD;  Location: [place] CV LAB;  Service: Cardiovascular;  Laterality: Left;   PERIPHERAL VASCULAR CATHETERIZATION  07/26/2015   Procedure: Lower Extremity Intervention;  Surgeon: Katha Cabal, MD;  Location: [place] CV LAB;  Service: Cardiovascular;;   PROSTATE BIOPSY  04/03/06 & 02/25/07   Social History   Social History Narrative   Lives with wife.; 2 daughters and 1 son.[employer] fan; semi-retd; Arboriculturist; Former Education officer, community, 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 In [place].    family history includes Coronary artery disease in his father and mother; Diabetes  in his brother, father, and sister; Diabetes (age of onset: 46) in his mother; Heart attack in his brother, father, maternal grandfather, and mother; Heart disease in his brother, father, and mother; Hypertension in his father and mother; Prostate cancer (age of onset: 36) in his paternal grandfather.   Review of Systems See HPI.  Some decreased hearing urinary leakage and excessive urination status post prostate cancer treatment.  All other review of systems negative.  Objective:   Physical Exam BP 120/70   Pulse 62   Ht 5\' 9"  (1.753 m)   Wt 250 lb (113.4 kg)   SpO2 98%   BMI 36.92 kg/m   Very obese elderly white man no acute distress Alert and oriented x3 with an appropriate mood and affect Lungs clear Heart sounds normal S1-S2 no rubs murmurs or gallops The abdomen is very obese  Data reviewed includes recent cardiology notes this year, also labs in the computer previous colonoscopy and pathology and  upper endoscopy as outlined above

## 2021-03-01 ENCOUNTER — Telehealth: Payer: Self-pay

## 2021-03-01 NOTE — Telephone Encounter (Signed)
I left Phillip Clements a detailed message on his cell# that we got permission to hold his Plavix for 5 days prior to his procedure from cardiology. I told him to call me back and let me know he got this message.

## 2021-03-02 ENCOUNTER — Inpatient Hospital Stay: Payer: Medicare Other

## 2021-03-02 ENCOUNTER — Inpatient Hospital Stay: Payer: Medicare Other | Admitting: Internal Medicine

## 2021-03-02 NOTE — Telephone Encounter (Signed)
Patient returning call to inform he did receive the message and knows to hold Plavix 5 days prior.

## 2021-03-27 ENCOUNTER — Inpatient Hospital Stay (HOSPITAL_BASED_OUTPATIENT_CLINIC_OR_DEPARTMENT_OTHER): Payer: Medicare Other | Admitting: Internal Medicine

## 2021-03-27 ENCOUNTER — Inpatient Hospital Stay: Payer: Medicare Other

## 2021-03-27 ENCOUNTER — Encounter: Payer: Self-pay | Admitting: Internal Medicine

## 2021-03-27 ENCOUNTER — Inpatient Hospital Stay: Payer: Medicare Other | Attending: Internal Medicine

## 2021-03-27 DIAGNOSIS — C61 Malignant neoplasm of prostate: Secondary | ICD-10-CM | POA: Diagnosis not present

## 2021-03-27 DIAGNOSIS — E119 Type 2 diabetes mellitus without complications: Secondary | ICD-10-CM | POA: Diagnosis not present

## 2021-03-27 DIAGNOSIS — C7951 Secondary malignant neoplasm of bone: Secondary | ICD-10-CM | POA: Diagnosis not present

## 2021-03-27 LAB — COMPREHENSIVE METABOLIC PANEL
ALT: 18 U/L (ref 0–44)
AST: 16 U/L (ref 15–41)
Albumin: 4.3 g/dL (ref 3.5–5.0)
Alkaline Phosphatase: 63 U/L (ref 38–126)
Anion gap: 7 (ref 5–15)
BUN: 29 mg/dL — ABNORMAL HIGH (ref 8–23)
CO2: 24 mmol/L (ref 22–32)
Calcium: 8.6 mg/dL — ABNORMAL LOW (ref 8.9–10.3)
Chloride: 104 mmol/L (ref 98–111)
Creatinine, Ser: 0.8 mg/dL (ref 0.61–1.24)
GFR, Estimated: >60 mL/min (ref >60)
Glucose, Bld: 179 mg/dL — ABNORMAL HIGH (ref 70–99)
Potassium: 4.3 mmol/L (ref 3.5–5.1)
Sodium: 135 mmol/L (ref 135–145)
Total Bilirubin: 1.1 mg/dL (ref 0.3–1.2)
Total Protein: 7.1 g/dL (ref 6.5–8.1)

## 2021-03-27 LAB — CBC WITH DIFFERENTIAL/PLATELET
Abs Immature Granulocytes: 0.03 10*3/uL (ref 0.00–0.07)
Basophils Absolute: 0 10*3/uL (ref 0.0–0.1)
Basophils Relative: 1 %
Eosinophils Absolute: 0.2 10*3/uL (ref 0.0–0.5)
Eosinophils Relative: 3 %
HCT: 44.6 % (ref 39.0–52.0)
Hemoglobin: 15.5 g/dL (ref 13.0–17.0)
Immature Granulocytes: 1 %
Lymphocytes Relative: 25 %
Lymphs Abs: 1.4 10*3/uL (ref 0.7–4.0)
MCH: 30 pg (ref 26.0–34.0)
MCHC: 34.8 g/dL (ref 30.0–36.0)
MCV: 86.4 fL (ref 80.0–100.0)
Monocytes Absolute: 0.5 10*3/uL (ref 0.1–1.0)
Monocytes Relative: 8 %
Neutro Abs: 3.5 10*3/uL (ref 1.7–7.7)
Neutrophils Relative %: 62 %
Platelets: 175 10*3/uL (ref 150–400)
RBC: 5.16 MIL/uL (ref 4.22–5.81)
RDW: 12.9 % (ref 11.5–15.5)
WBC: 5.5 10*3/uL (ref 4.0–10.5)
nRBC: 0 % (ref 0.0–0.2)

## 2021-03-27 LAB — PSA: Prostatic Specific Antigen: <0.01 ng/mL (ref 0.00–4.00)

## 2021-03-27 MED ORDER — SODIUM CHLORIDE 0.9 % IV SOLN
Freq: Once | INTRAVENOUS | Status: AC
  Filled 2021-03-27: qty 250

## 2021-03-27 MED ORDER — ZOLEDRONIC ACID 4 MG/5ML IV CONC
3.3000 mg | Freq: Once | INTRAVENOUS | Status: AC
  Administered 2021-03-27: 3.3 mg via INTRAVENOUS
  Filled 2021-03-27: qty 4.13

## 2021-03-27 NOTE — Assessment & Plan Note (Addendum)
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 S/P- EBRT to prostate [last Tx- on 01/08/2020]. PET Oct 10th, 2021-Mild asymmetric activity within the prostate gland decreased from comparison from Feb 2021; No convincing evidence of nodal metastasis. No visceral metastasis or skeletal metastasis. Currently off- Darolutamide [sec to poor tolerance].   AUG 2022- <0.01.   # Continue surveillance closely off any oral novel ADT therapies given the previous intolerance to Darolutamide.  Continue Eligard [every 6 months; 05/22; Dr. Stoioff].   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 Ca- 8.6; Porceed with zometa today.    # DM-2 on insulin/OHA.  Discussed importance of healthy weight/and weight loss.  Strongly recommend eating more green leafy vegetables and cutting down processed food/ carbohydrates.  Instead increasing whole grains / protein in the diet.  Multiple studies have shown that optimal weight would help improve cardiovascular risk; also shown to cut on the risk of malignancies-colon cancer; amongst other obese related cancers.  Patient is a high risk for muscle loss Eligard/risk of falls .  Hence recommend proximal muscle strengthening exercises.  # DISPOSITION:  #  zometa today. #  Follow up in 3 months; MD- labs- cbc/cmp/PSA; possible zometa- Dr.B

## 2021-03-31 ENCOUNTER — Ambulatory Visit (AMBULATORY_SURGERY_CENTER): Payer: Medicare Other | Admitting: Internal Medicine

## 2021-03-31 ENCOUNTER — Other Ambulatory Visit: Payer: Self-pay

## 2021-03-31 ENCOUNTER — Encounter: Payer: Self-pay | Admitting: Internal Medicine

## 2021-03-31 VITALS — BP 105/48 | HR 58 | Temp 97.4°F | Resp 14 | Ht 69.0 in | Wt 250.0 lb

## 2021-03-31 DIAGNOSIS — Z86010 Personal history of colonic polyps: Secondary | ICD-10-CM

## 2021-03-31 DIAGNOSIS — D120 Benign neoplasm of cecum: Secondary | ICD-10-CM

## 2021-03-31 DIAGNOSIS — D123 Benign neoplasm of transverse colon: Secondary | ICD-10-CM | POA: Diagnosis not present

## 2021-03-31 DIAGNOSIS — I2510 Atherosclerotic heart disease of native coronary artery without angina pectoris: Secondary | ICD-10-CM | POA: Diagnosis not present

## 2021-03-31 DIAGNOSIS — G4733 Obstructive sleep apnea (adult) (pediatric): Secondary | ICD-10-CM | POA: Diagnosis not present

## 2021-03-31 MED ORDER — SODIUM CHLORIDE 0.9 % IV SOLN
500.0000 mL | INTRAVENOUS | Status: DC
Start: 2021-03-31 — End: 2021-03-31

## 2021-03-31 NOTE — Progress Notes (Signed)
Called to room to assist during endoscopic procedure.  Patient ID and intended procedure confirmed with present staff. Received instructions for my participation in the procedure from the performing physician.  

## 2021-03-31 NOTE — Progress Notes (Signed)
History and Physical Interval Note:  03/31/2021 8:26 AM  Phillip Clements  has presented today for endoscopic procedure(s), with the diagnosis of  Encounter Diagnosis  Name Primary?   Personal history of colonic polyps Yes  .  The various methods of evaluation and treatment have been discussed with the patient and/or family. After consideration of risks, benefits and other options for treatment, the patient has consented to  the endoscopic procedure(s).   The patient's history has been reviewed, patient examined, no change in status, stable for surgery.  I have reviewed the patient's chart and labs.  Questions were answered to the patient's satisfaction.     Gatha Mayer, MD, [facility]

## 2021-03-31 NOTE — Progress Notes (Signed)
Report given to PACU, vss 

## 2021-03-31 NOTE — Op Note (Signed)
[facility] Patient Name: Phillip Clements Procedure Date: 03/31/2021 8:18 AM MRN: [id number] Endoscopist: Gatha Mayer , MD Age: 69 Referring MD:  Date of Birth: 1951-05-17 Gender: Male Account #: [id number] Procedure:                Colonoscopy Indications:              Surveillance: Personal history of adenomatous                            polyps on last colonoscopy > 3 years ago, Last                            colonoscopy: 2018 Medicines:                Propofol per Anesthesia, Monitored Anesthesia Care Procedure:                Pre-Anesthesia Assessment:                           - Prior to the procedure, a History and Physical                            was performed, and patient medications and                            allergies were reviewed. The patient's tolerance of                            previous anesthesia was also reviewed. The risks                            and benefits of the procedure and the sedation                            options and risks were discussed with the patient.                            All questions were answered, and informed consent                            was obtained. Prior Anticoagulants: The patient                            last took Plavix (clopidogrel) 5 days prior to the                            procedure. ASA Grade Assessment: III - A patient                            with severe systemic disease. After reviewing the                            risks and benefits, the patient was deemed in  satisfactory condition to undergo the procedure.                           After obtaining informed consent, the colonoscope                            was passed under direct vision. Throughout the                            procedure, the patient's blood pressure, pulse, and                            oxygen saturations were monitored continuously. The                            CF [id number]  was introduced through the anus                            and advanced to the the cecum, identified by                            appendiceal orifice and ileocecal valve. The                            colonoscopy was performed without difficulty. The                            patient tolerated the procedure well. The quality                            of the bowel preparation was good. The ileocecal                            valve, appendiceal orifice, and rectum were                            photographed. The bowel preparation used was                            Miralax via split dose instruction. Scope In: 8:32:15 AM Scope Out: 8:44:46 AM Scope Withdrawal Time: 0 hours 10 minutes 33 seconds  Total Procedure Duration: 0 hours 12 minutes 31 seconds  Findings:                 The perianal and digital rectal examinations were                            normal.                           Two sessile polyps were found in the transverse                            colon and cecum. The polyps were diminutive in  size. These polyps were removed with a cold snare.                            Resection and retrieval were complete. Verification                            of patient identification for the specimen was                            done. Estimated blood loss was minimal.                           The exam was otherwise without abnormality on                            direct and retroflexion views. Complications:            No immediate complications. Estimated Blood Loss:     Estimated blood loss was minimal. Impression:               - Two diminutive polyps in the transverse colon and                            in the cecum, removed with a cold snare. Resected                            and retrieved.                           - The examination was otherwise normal on direct                            and retroflexion views. Recommendation:           -  Patient has a contact number available for                            emergencies. The signs and symptoms of potential                            delayed complications were discussed with the                            patient. Return to normal activities tomorrow.                            Written discharge instructions were provided to the                            patient.                           - Resume previous diet.                           - Continue present medications.                           -  Await pathology results.                           - Resume Plavix (clopidogrel) at prior dose                            tomorrow.                           - Repeat colonoscopy is recommended for                            surveillance. The colonoscopy date will be                            determined after pathology results from today's                            exam become available for review. Gatha Mayer, MD 03/31/2021 8:57:01 AM This report has been signed electronically.

## 2021-03-31 NOTE — Patient Instructions (Addendum)
2 tiny polyps removed today.  I will let you know pathology results and when to have another routine colonoscopy by mail and/or My Chart.  Resume clopidogrel tomorrow.  I appreciate the opportunity to care for you. Gatha Mayer, MD, [facility]  Handout on polyps given.  YOU HAD AN ENDOSCOPIC PROCEDURE TODAY AT [facility]:   Refer to the procedure report that was given to you for any specific questions about what was found during the examination.  If the procedure report does not answer your questions, please call your gastroenterologist to clarify.  If you requested that your care partner not be given the details of your procedure findings, then the procedure report has been included in a sealed envelope for you to review at your convenience later.  YOU SHOULD EXPECT: Some feelings of bloating in the abdomen. Passage of more gas than usual.  Walking can help get rid of the air that was put into your GI tract during the procedure and reduce the bloating. If you had a lower endoscopy (such as a colonoscopy or flexible sigmoidoscopy) you may notice spotting of blood in your stool or on the toilet paper. If you underwent a bowel prep for your procedure, you may not have a normal bowel movement for a few days.  Please Note:  You might notice some irritation and congestion in your nose or some drainage.  This is from the oxygen used during your procedure.  There is no need for concern and it should clear up in a day or so.  SYMPTOMS TO REPORT IMMEDIATELY:  Following lower endoscopy (colonoscopy or flexible sigmoidoscopy):  Excessive amounts of blood in the stool  Significant tenderness or worsening of abdominal pains  Swelling of the abdomen that is new, acute  Fever of 100F or higher   For urgent or emergent issues, a gastroenterologist can be reached at any hour by calling [phone]. Do not use MyChart messaging for urgent concerns.    DIET:  We do recommend a small  meal at first, but then you may proceed to your regular diet.  Drink plenty of fluids but you should avoid alcoholic beverages for 24 hours.  ACTIVITY:  You should plan to take it easy for the rest of today and you should NOT DRIVE or use heavy machinery until tomorrow (because of the sedation medicines used during the test).    FOLLOW UP: Our staff will call the number listed on your records 48-72 hours following your procedure to check on you and address any questions or concerns that you may have regarding the information given to you following your procedure. If we do not reach you, we will leave a message.  We will attempt to reach you two times.  During this call, we will ask if you have developed any symptoms of COVID 19. If you develop any symptoms (ie: fever, flu-like symptoms, shortness of breath, cough etc.) before then, please call [phone].  If you test positive for Covid 19 in the 2 weeks post procedure, please call and report this information to [place].    If any biopsies were taken you will be contacted by phone or by letter within the next 1-3 weeks.  Please call us at [phone] if you have not heard about the biopsies in 3 weeks.    SIGNATURES/CONFIDENTIALITY: You and/or your care partner have signed paperwork which will be entered into your electronic medical record.  These signatures attest to the fact that that  the information above on your After Visit Summary has been reviewed and is understood.  Full responsibility of the confidentiality of this discharge information lies with you and/or your care-partner.

## 2021-03-31 NOTE — Progress Notes (Signed)
Pt's states no medical or surgical changes since previsit or office visit. 

## 2021-04-01 ENCOUNTER — Other Ambulatory Visit: Payer: Self-pay | Admitting: Cardiovascular Disease

## 2021-04-01 ENCOUNTER — Other Ambulatory Visit: Payer: Self-pay | Admitting: Internal Medicine

## 2021-04-03 NOTE — Telephone Encounter (Signed)
Please advise for refill Furosemide 20 mg qd. I spoke with pt and he wanted to verify if he should be taking his fluid pill. He hasn't been taking it because he hasn't needed to but if he should he would need a refill. Please advise medication not on pt's current medication list.

## 2021-04-03 NOTE — Telephone Encounter (Signed)
Called the patient to inquire who was the prescribing provider listed on his lasix prescription bottle. Lasix is not listed on his current medication list. Lmtcb.

## 2021-04-04 ENCOUNTER — Telehealth: Payer: Self-pay | Admitting: *Deleted

## 2021-04-04 ENCOUNTER — Telehealth: Payer: Self-pay

## 2021-04-04 NOTE — Telephone Encounter (Signed)
No answer, left message to call back later today, B.Tatianna Ibbotson RN. 

## 2021-04-04 NOTE — Telephone Encounter (Signed)
  Follow up Call-  Call back number 03/31/2021  Post procedure Call Back phone  # [phone]  Permission to leave phone message Yes  Some recent data might be hidden     Patient questions:  Do you have a fever, pain , or abdominal swelling? No. Pain Score  0 *  Have you tolerated food without any problems? Yes.    Have you been able to return to your normal activities? Yes.    Do you have any questions about your discharge instructions: Diet   No. Medications  No. Follow up visit  No.  Do you have questions or concerns about your Care? No.  Actions: * If pain score is 4 or above: No action needed, pain <4.  Have you developed a fever since your procedure? no  2.   Have you had an respiratory symptoms (SOB or cough) since your procedure? no  3.   Have you tested positive for COVID 19 since your procedure no  4.   Have you had any family members/close contacts diagnosed with the COVID 19 since your procedure?  no   If yes to any of these questions please route to Joylene John, RN and Joella Prince, RN

## 2021-04-09 ENCOUNTER — Encounter: Payer: Self-pay | Admitting: Internal Medicine

## 2021-04-09 NOTE — Progress Notes (Signed)
[place] CONSULT NOTE  Patient Care Team: Tonia Ghent, MD as PCP - General (Family Medicine) Wellington Hampshire, MD as PCP - Cardiology (Cardiology)  CHIEF COMPLAINTS/PURPOSE OF CONSULTATION: Prostate cancer  #  Oncology History Overview Note  1. Prostate adenocarcinoma  a. 02/2006 PSA returned elevated at 17.49  b. 04/03/2006; PSA 24.7; prostate biopsy Gleason 3+3 with 4/4 cores c. tx with HIFU overseas late 2007 in [place] [Dr.Cope]] d. persistent PSA elevation over period 2007-2010; nadir 2.1 09/16/06, peak 15.0 ng/mL  e. 04/2006 bone scan and CT abd/pelvis without evidence of metastasis f. 03/08/2007 CT abdomen/pelvis with no definitive metastatic disease noted  g. 03/18/2007 prostascint scan with no evidence of disease but small pelvic lymph nodes noted  h. 08/15/2009 prostascint scan with no evidence of local prostate cancer or distant metastases  i. 08/22/2009 PSA 15 ng/mL; 02/06/2010 PSA 20.5 ng/mL  j. 03/08/2010 PSA 20.9 ng/mL  k. 06/02/10 initiation of degarelix  l. 05/25/10 PET CT (Fluoride) revealed heterogenous areas of FDG-avidity in ribs, femurs, humeri, areas also in the metatarsals noted which may have been arthiritic in nature  m. 07/03/10 PSA 9.6 ng/mL  o.  Started Lupron/Casodex; June 2020-PSA from 0.2 to 0.3 -I;  As per patient-Casodex was withdrawn-but noted to have a jump of PSA to 0.9. FEB 2021-evidence of prostatic malignancy in the residual prostate bed; no distant metastatic disease   # FEB 2021-PET scan uptake in prostate; no bone mets.-EBRT to prostate [finished end of May 2021]; October 2021 PET scan improved prostate uptake; no evidence metastatic disease.  # EARLY NOV-apalutamide 3pills [pt pref]; STOPPED prior to christmas, 2021 [fatigue/?diarrhea]  #comorbidities-CAD s/p CABG; diabetes; PVD  # Prostate cancer-at age of 70 s/p-genetic counseling-NEG.   # NGS/MOLECULAR TESTS:NA  # PALLIATIVE CARE EVALUATION: NA  # PAIN MANAGEMENT:  NA   DIAGNOSIS: [name]  STAGE:    4     ;  GOALS: Palliative/control  CURRENT/MOST RECENT THERAPY :     Prostate cancer ([place])  09/16/2007 Initial Diagnosis   Prostate cancer ([place])   01/22/2020 Genetic Testing   Negative genetic testing:  No pathogenic variants detected on the [facility] Multi-Cancer panel. The report date is 01/22/2020.   The Common Hereditary Cancers Panel offered by [facility] includes sequencing and/or deletion duplication testing of the following 48 genes: APC, ATM, AXIN2, BARD1, BMPR1A, BRCA1, BRCA2, BRIP1, CDH1, CDK4, CDKN2A (p14ARF), CDKN2A (p16INK4a), CHEK2, CTNNA1, DICER1, EPCAM (Deletion/duplication testing only), GREM1 (promoter region deletion/duplication testing only), KIT, MEN1, MLH1, MSH2, MSH3, MSH6, MUTYH, NBN, NF1, NTHL1, PALB2, PDGFRA, PMS2, POLD1, POLE, PTEN, RAD50, RAD51C, RAD51D, RNF43, SDHB, SDHC, SDHD, SMAD4, SMARCA4. STK11, TP53, TSC1, TSC2, and VHL.  The following genes were evaluated for sequence changes only: SDHA and HOXB13 c.251G>A variant only.     HISTORY OF PRESENTING ILLNESS:  Phillip Clements 70 y.o.  male with history of castrate resistant prostate cancer with Hx of  metastatic to bone/Lupron-is here for follow-up.  Patient denies any worsening shortness of breath or cough.  Denies any chills or fevers.  Mild fatigue.   Review of Systems  Constitutional:  Positive for malaise/fatigue. Negative for chills, diaphoresis, fever and weight loss.  HENT:  Negative for nosebleeds and sore throat.   Eyes:  Negative for double vision.  Respiratory:  Negative for cough, hemoptysis, sputum production, shortness of breath and wheezing.   Cardiovascular:  Negative for chest pain, palpitations, orthopnea and leg swelling.  Gastrointestinal:  Negative for abdominal pain,  blood in stool, constipation, diarrhea, heartburn, melena, nausea and vomiting.  Genitourinary:  Positive for frequency and urgency.  Musculoskeletal:   Positive for back pain. Negative for joint pain.  Skin: Negative.  Negative for itching and rash.  Neurological:  Negative for dizziness, tingling, focal weakness, weakness and headaches.  Endo/Heme/Allergies:  Does not bruise/bleed easily.  Psychiatric/Behavioral:  Negative for depression. The patient is not nervous/anxious and does not have insomnia.     MEDICAL HISTORY:  Past Medical History:  Diagnosis Date   Cataract    bilateral eye in 2010   Coronary artery disease    a. CABG 04/2014: (LIMA->LAD, VG->DIAG, VG->OM2, VG->PDA)  b. 07/2014 early graft failure (VG->OM2 60, LIMA->LAD atretic, VG->PDA & VG->D1 100). s/p successful rotational atherectomy & DES of the LM & pLAD (3.0x24 Promus DES) & mLAD (2.25x24 Promus DES).   CVA (cerebral infarction)    Family history of prostate cancer    Hx of adenomatous colonic polyps 06/17/2017   Hyperlipidemia    a. Statin intolerant.   Hypertension 2000   Moderate mitral regurgitation    a. 02/2015 Echo: EF 55-60%, no rwma, mod MR, mod BAE, mildly dil RV w/ low nl RV fxn.   OSA on CPAP    Peripheral vascular disease ([place])    a. left SFA angioplasty in December 2016   Prostate cancer [facility])    Sleep apnea    wears CPAP nightly   Type II diabetes mellitus ([place])     SURGICAL HISTORY: Past Surgical History:  Procedure Laterality Date   ANTERIOR CERVICAL DECOMP/DISCECTOMY FUSION  08/25/2012   Procedure: ANTERIOR CERVICAL DECOMPRESSION/DISCECTOMY FUSION 2 LEVELS;  Surgeon: Hosie Spangle, MD;  Location: [place] [facility];  Service: Neurosurgery;  Laterality: N/A;  Cervical five-six Cervical six-seven anterior cervical decompression with fusion and plating and bonegraft   BACK SURGERY     CARDIAC CATHETERIZATION  04/2014; 07/19/2014   CORONARY ARTERY BYPASS GRAFT N/A 04/26/2014   Procedure: CORONARY ARTERY BYPASS GRAFTING (CABG), on pump, times four, using left internal mammary artery, right greater saphenous vein harvested endoscopically.;  Surgeon:  Ivin Poot, MD;  Location: [place];  Service: Open Heart Surgery;  Laterality: N/A;  LIMA to LAD, SVG to DIAGONAL, SVG to OM1, SVG to PDA) with EVH of the RIGHT THIGH and LOWER EXTREMITY SAPHENOUS VEIN   Cytoscopy prostatic stone o/w nml  06/21/08   Dr. Jacqlyn Larsen   ETT myoview  09/13/09   Low risk EF 49%   High intense focused ultrasound  07/20/06   By Dr. Jacqlyn Larsen   INTRAOPERATIVE TRANSESOPHAGEAL ECHOCARDIOGRAM N/A 04/26/2014   Procedure: INTRAOPERATIVE TRANSESOPHAGEAL ECHOCARDIOGRAM;  Surgeon: Ivin Poot, MD;  Location: [facility];  Service: Open Heart Surgery;  Laterality: N/A;   PERCUTANEOUS CORONARY ROTOBLATOR INTERVENTION (PCI-R) N/A 07/22/2014   Procedure: PERCUTANEOUS CORONARY ROTOBLATOR INTERVENTION (PCI-R);  Surgeon: Peter M Martinique, MD;  Location: [facility] C[facility];  Service: Cardiovascular;  Laterality: N/A;   PERIPHERAL VASCULAR CATHETERIZATION Left 07/26/2015   Procedure: Lower Extremity Angiography;  Surgeon: Katha Cabal, MD;  Location: [facility] CV LAB;  Service: Cardiovascular;  Laterality: Left;   PERIPHERAL VASCULAR CATHETERIZATION  07/26/2015   Procedure: Lower Extremity Intervention;  Surgeon: Katha Cabal, MD;  Location: [place] CV LAB;  Service: Cardiovascular;;   PROSTATE BIOPSY  04/03/06 & 02/25/07    SOCIAL HISTORY: Social History   Socioeconomic History   Marital status: Married    Spouse name: Not on file   Number of children: 3  Years of education: Not on file   Highest education level: Not on file  Occupational History   Occupation: Scientist, 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  Tobacco Use   Smoking status: Former    Packs/day: 1.00    Years: 36.00    Pack years: 36.00    Types: Cigarettes    Quit date: 03/01/2014    Years since quitting: 7.1   Smokeless tobacco: Never  Vaping Use   Vaping Use: Never used  Substance and Sexual Activity   Alcohol use: Yes    Comment: 07/19/2014 "might have a drink a couple times/yr"   Drug use: No   Sexual  activity: Not Currently  Other Topics Concern   Not on file  Social History Narrative   Lives with wife.; 2 daughters and 1 son.[employer] fan; semi-retd; Arboriculturist; Former Education officer, community, 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 In [place].    Social Determinants of Health   Financial Resource Strain: Not on file  Food Insecurity: Not on file  Transportation Needs: Not on file  Physical Activity: Not on file  Stress: Not on file  Social Connections: Not on file  Intimate Partner Violence: Not on file    FAMILY HISTORY: Family History  Problem Relation Age of Onset   Diabetes Mother 21       DM   Coronary artery disease Mother    Hypertension Mother    Heart attack Mother        CABG with MI in 2005   Heart disease Mother        CV   Hypertension Father    Heart attack Father        CABG with Mi around 1997   Coronary artery disease Father    Heart disease Father        CV   Diabetes Father    Heart attack Brother        MI/PTCA   Heart disease Brother        CV   Diabetes Brother    Heart attack Maternal Grandfather        MI   Prostate cancer Paternal Grandfather 85       likely metastatic   Diabetes Sister        DM   Breast cancer Neg Hx        Breast/ovarian/uterine cancer   Colon cancer Neg Hx    Depression Neg Hx    Alcohol abuse Neg Hx        ETOH/drug abuse   Stroke Neg Hx     ALLERGIES:  is allergic to coreg [carvedilol], metformin and related, protonix [pantoprazole sodium], rosuvastatin calcium, and statins.  MEDICATIONS:  Current Outpatient Medications  Medication Sig Dispense Refill   aspirin EC 81 MG tablet Take 1 tablet (81 mg total) by mouth daily. 90 tablet 3   Dulaglutide (TRULICITY) 1.5 EN/4.0HW SOPN Inject 1.5 mg into the skin once a week. 2 mL 11   Evolocumab (REPATHA SURECLICK) 808 MG/ML SOAJ INJECT ONE PEN INTO THE SKIN EVERY 14 DAYS 2 mL 5   ezetimibe (ZETIA) 10 MG tablet  Take 1 tablet (10 mg total) by mouth daily. 30 tablet 7   glucose blood (ONETOUCH ULTRA) test strip CHECK BLOOD SUGAR TWICE A DAY - DX E11.59 200 strip 3   Injection Device (CORNWALL METAL PIPETTING) MISC Frequency:PHARMDIR   Dosage:0.0     Instructions:  Note:diagnosis 250.02 Dose: NA  insulin aspart (NOVOLOG FLEXPEN) 100 UNIT/ML FlexPen Inject 16-24 Units into the skin daily before supper. 30 mL 3   insulin degludec (TRESIBA FLEXTOUCH) 200 UNIT/ML FlexTouch Pen Inject 86 Units into the skin daily. 18 mL 3   Insulin Pen Needle 32G X 4 MM MISC Use 1x a day 100 each 3   leuprolide (LUPRON) 30 MG injection Inject 45 mg into the muscle every 6 (six) months.     losartan (COZAAR) 25 MG tablet Take 1 tablet (25 mg total) by mouth daily. 90 tablet 1   meclizine (ANTIVERT) 25 MG tablet Take 0.5-1 tablets (12.5-25 mg total) by mouth 2 (two) times daily as needed for dizziness or nausea. 30 tablet 0   mirabegron ER (MYRBETRIQ) 50 MG TB24 tablet Take 1 tablet (50 mg total) by mouth daily. 30 tablet 12   nitroGLYCERIN (NITROSTAT) 0.4 MG SL tablet Place 1 tablet (0.4 mg total) under the tongue as needed. 25 tablet 2   tamsulosin (FLOMAX) 0.4 MG CAPS capsule TAKE 1 CAPSULE BY MOUTH ONCE DAILY AFTERSUPPER 30 capsule 11   clopidogrel (PLAVIX) 75 MG tablet TAKE 1 TABLET BY MOUTH ONCE DAILY 90 tablet 1   INVOKANA 300 MG TABS tablet TAKE 1 TABLET BY MOUTH ONCE A DAY WITH BREAKFAST 90 tablet 3   No current facility-administered medications for this visit.      Marland Kitchen  PHYSICAL EXAMINATION: ECOG PERFORMANCE STATUS: 0 - Asymptomatic  Vitals:   03/27/21 1025  BP: (!) 105/49  Pulse: 65  Resp: 18  Temp: 98.4 F (36.9 C)  SpO2: 99%   Filed Weights   03/27/21 1025  Weight: 244 lb (110.7 kg)    Physical Exam HENT:     Head: Normocephalic and atraumatic.     Mouth/Throat:     Pharynx: No oropharyngeal exudate.  Eyes:     Pupils: Pupils are equal, round, and reactive to light.  Cardiovascular:      Rate and Rhythm: Normal rate and regular rhythm.  Pulmonary:     Effort: Pulmonary effort is normal. No respiratory distress.     Breath sounds: Normal breath sounds. No wheezing.  Abdominal:     General: Bowel sounds are normal. There is no distension.     Palpations: Abdomen is soft. There is no mass.     Tenderness: There is no abdominal tenderness. There is no guarding or rebound.  Musculoskeletal:        General: No tenderness. Normal range of motion.     Cervical back: Normal range of motion and neck supple.  Skin:    General: Skin is warm.  Neurological:     Mental Status: He is alert and oriented to person, place, and time.  Psychiatric:        Mood and Affect: Affect normal.    LABORATORY DATA:  I have reviewed the data as listed Lab Results  Component Value Date   WBC 5.5 03/27/2021   HGB 15.5 03/27/2021   HCT 44.6 03/27/2021   MCV 86.4 03/27/2021   PLT 175 03/27/2021   Recent Labs    07/28/20 0922 08/19/20 0939 08/26/20 0923 11/11/20 0942 11/29/20 0843 12/08/20 1253 03/27/21 1009  NA 139 142   < > 142 135 134* 135  K 4.7 4.6   < > 4.1 4.3 4.5 4.3  CL 102 105   < > 104 98 100 104  CO2 19* 20   < > 28 28 26 24   GLUCOSE 290* 236*   < >  159* 318* 360* 179*  BUN 22 23   < > 23 27* 23 29*  CREATININE 0.88 0.95   < > 1.19 1.18 1.05 0.80  CALCIUM 9.0 8.9   < > 8.6 9.4 8.1* 8.6*  GFRNONAA 88 81   < >  --  >60 >60 >60  GFRAA 101 94  --   --   --   --   --   PROT  --   --    < > 6.1 7.2  --  7.1  ALBUMIN  --   --    < > 4.0 4.1  --  4.3  AST  --   --    < > 29 15  --  16  ALT  --   --    < > 45 22  --  18  ALKPHOS  --   --    < > 59 77  --  63  BILITOT  --   --    < > 0.6 1.3*  --  1.1   < > = values in this interval not displayed.    RADIOGRAPHIC STUDIES: I have personally reviewed the radiological images as listed and agreed with the findings in the report. No results found.  ASSESSMENT & PLAN:   Prostate cancer ([place]) # STAGE-IV-Prostate  cancer-metastatic to bone/castrate resistant. S/P- EBRT to prostate [last Tx- on 01/08/2020]. PET Oct 10th, 2021-Mild asymmetric activity within the prostate gland decreased from comparison from Feb 2021; No convincing evidence of nodal metastasis. No visceral metastasis or skeletal metastasis. Currently off- Darolutamide [sec to poor tolerance].   AUG 2022- <0.01.   # Continue surveillance closely off any oral novel ADT therapies given the previous intolerance to Darolutamide.  Continue Eligard [every 6 months; 05/22; Dr. Stoioff].   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 Ca- 8.6; Porceed with zometa today.    # DM-2 on insulin/OHA.  Discussed importance of healthy weight/and weight loss.  Strongly recommend eating more green leafy vegetables and cutting down processed food/ carbohydrates.  Instead increasing whole grains / protein in the diet.  Multiple studies have shown that optimal weight would help improve cardiovascular risk; also shown to cut on the risk of malignancies-colon cancer; amongst other obese related cancers.  Patient is a high risk for muscle loss Eligard/risk of falls .  Hence recommend proximal muscle strengthening exercises.  # DISPOSITION:  #  zometa today. #  Follow up in 3 months; MD- labs- cbc/cmp/PSA; possible zometa- Dr.B   All questions were answered. The patient knows to call the clinic with any problems, questions or concerns.    Cammie Sickle, MD 04/09/2021 5:23 PM

## 2021-04-10 ENCOUNTER — Telehealth: Payer: Self-pay | Admitting: Internal Medicine

## 2021-04-10 ENCOUNTER — Telehealth: Payer: Self-pay | Admitting: *Deleted

## 2021-04-10 NOTE — Telephone Encounter (Signed)
[name] called stating that since patient had infusion 2 weeks ago, his temples have been tender and swollen and is asking what can be done. Please advise

## 2021-04-10 NOTE — Telephone Encounter (Signed)
On 8/29- post zometa noted to have jaw pain/body aches/joint pains 2 days post infusion.  Recommend Tylenol 1 to 2 pills every 8 hours; if not improved at ibuprofen.  Also asked the patient to take Tylenol/Benadryl 1 to 2 hours prior to next infusion..    C-please schedule infusion at next visit over 30 minutes.   GB

## 2021-04-11 ENCOUNTER — Encounter: Payer: Self-pay | Admitting: Internal Medicine

## 2021-04-11 ENCOUNTER — Other Ambulatory Visit: Payer: Self-pay | Admitting: Internal Medicine

## 2021-04-11 NOTE — Telephone Encounter (Signed)
Just need a note on schedule to remind nurses to run the zometa over 30 mins.

## 2021-04-11 NOTE — Telephone Encounter (Signed)
He is down for infusion of Zometa next time im assuming that is the infusion over 30 min you are talking about.

## 2021-04-11 NOTE — Telephone Encounter (Signed)
See md phone note 

## 2021-04-12 ENCOUNTER — Encounter: Payer: Self-pay | Admitting: Internal Medicine

## 2021-04-14 ENCOUNTER — Encounter: Payer: Self-pay | Admitting: Family Medicine

## 2021-04-14 NOTE — Telephone Encounter (Signed)
I spoke with Phillip Clements; Phillip Clements said since by pass 7 yrs ago Phillip Clements has had a prod cough with clear phlegm; Phillip Clements said he is used to it but his wife thinks the cough has worsened. Phillip Clements said has SOB upon any exertion; Phillip Clements said he had that when he participated in athletics as a young man. When asked if SOB had worsened and Phillip Clements said "not really". Phillip Clements does not have SOB when sitting. Phillip Clements said he does get tired easily.  Phillip Clements said on 03/27/21 when got infusion to prevent osteoporosis Phillip Clements went back to work and 2 hrs later he had back pain that lasted for 2 days; now back pain has gone away but started with H/A on rt side of head which has continued on and off; the position of h/a has changed and has gone all over pts head; now pain level for h/a is 4. Phillip Clements wants to be seen by Dr Damita Dunnings only. I spoke with Dr Damita Dunnings and if Phillip Clements is progressively worse then should go to ED for eval. Otherwise can make appt next wk first available. Phillip Clements voiced understanding and said he would go to ED if needed but at this time wants to make appt with Dr Damita Dunnings only. Scheduled first available with Dr Damita Dunnings on 04/25/21 at 3pm for 30' appt, again reviewed UC & ED precautions and Phillip Clements voiced understanding and appreciated our time today. Barbee Shropshire will schedule appt (I did not have clearance to schedule). Sending note to Dr Damita Dunnings and Janett Billow CMA.

## 2021-04-17 NOTE — Telephone Encounter (Signed)
Noted. Thanks.

## 2021-04-21 ENCOUNTER — Telehealth: Payer: Self-pay | Admitting: Internal Medicine

## 2021-04-21 NOTE — Telephone Encounter (Signed)
Phillip Clements with [name] requests to be called at ph# [phone] re: Phillip Clements states she wants to clarify/confirm which medications Patient is supposed to be taking for Diabetes. Also, Phillip Clements states that Patient is allergic to statin drugs, therefore,  Phillip Clements needs a diagnosis code if Patient is indeed allergic to statin drugs.

## 2021-04-21 NOTE — Telephone Encounter (Signed)
Called and confirmed with Phillip Clements diabetic medication being prescribed by Dr Cruzita Lederer.

## 2021-04-25 ENCOUNTER — Other Ambulatory Visit: Payer: Self-pay | Admitting: Cardiovascular Disease

## 2021-04-25 ENCOUNTER — Encounter: Payer: Self-pay | Admitting: Family Medicine

## 2021-04-25 ENCOUNTER — Other Ambulatory Visit: Payer: Self-pay

## 2021-04-25 ENCOUNTER — Ambulatory Visit (INDEPENDENT_AMBULATORY_CARE_PROVIDER_SITE_OTHER): Payer: Medicare Other | Admitting: Family Medicine

## 2021-04-25 ENCOUNTER — Ambulatory Visit (INDEPENDENT_AMBULATORY_CARE_PROVIDER_SITE_OTHER)
Admission: RE | Admit: 2021-04-25 | Discharge: 2021-04-25 | Disposition: A | Discharge status: Home / Self Care | Payer: Medicare Other | Source: Ambulatory Visit | Attending: Family Medicine | Admitting: Family Medicine

## 2021-04-25 VITALS — BP 138/84 | HR 60 | Temp 97.7°F | Ht 69.0 in | Wt 240.0 lb

## 2021-04-25 DIAGNOSIS — R0602 Shortness of breath: Secondary | ICD-10-CM

## 2021-04-25 DIAGNOSIS — I2510 Atherosclerotic heart disease of native coronary artery without angina pectoris: Secondary | ICD-10-CM | POA: Diagnosis not present

## 2021-04-25 DIAGNOSIS — R059 Cough, unspecified: Secondary | ICD-10-CM

## 2021-04-25 DIAGNOSIS — C61 Malignant neoplasm of prostate: Secondary | ICD-10-CM

## 2021-04-25 MED ORDER — FAMOTIDINE 20 MG PO TABS: 20.0000 mg | ORAL_TABLET | Freq: Every day | ORAL | Status: DC

## 2021-04-25 NOTE — Patient Instructions (Signed)
Go to the lab on the way out.   If you have mychart we'll likely use that to update you.    I'll update your other docs.  Try taking OTC pepcid and see if that helps with the cough.   Take care.  Glad to see you.

## 2021-04-25 NOTE — Progress Notes (Signed)
This visit occurred during the SARS-CoV-2 public health emergency.  Safety protocols were in place, including screening questions prior to the visit, additional usage of staff PPE, and extensive cleaning of exam room while observing appropriate contact time as indicated for disinfecting solutions.  See following phone note.  I will need input from his other doctors about his ongoing treatment. He had sig trunk pain and HA after prev Zometa infusion.  HA took about 2 weeks to get better.  Back pain got better after a few days.    Most recent PSA <0.01.  Discussed with patient.  Cough.  Wife noted it.  H/o smoking, but not currently.  Patient doesn't note the cough that much.  Wife thought cough was worse.  Some sputum, clear.  No wheeze.  No FCNAVD.  No hemoptysis.  Cough isn't worse supine.  No BLE edema.  Cough noted since prev bypass.   He can walk on treadmill but does have some fatigue with working in the yard where he'll have to sit down, occ needing to take a deep breath.    He bought 19 acres and has been working outside, with possible allergen exposure.  Meds, vitals, and allergies reviewed.   ROS: Per HPI unless specifically indicated in ROS section   GEN: nad, alert and oriented HEENT: ncat NECK: supple w/o LA CV: rrr.  no murmur PULM: ctab, no inc wob ABD: soft, +bs EXT: no edema SKIN: Well-perfused.

## 2021-04-26 ENCOUNTER — Telehealth: Payer: Self-pay | Admitting: Family Medicine

## 2021-04-26 DIAGNOSIS — R059 Cough, unspecified: Secondary | ICD-10-CM | POA: Insufficient documentation

## 2021-04-26 LAB — BRAIN NATRIURETIC PEPTIDE: Pro B Natriuretic peptide (BNP): 83 pg/mL (ref 0.0–100.0)

## 2021-04-26 NOTE — Assessment & Plan Note (Signed)
History of, see following phone note to get input from his other doctors, with appreciation.

## 2021-04-26 NOTE — Telephone Encounter (Signed)
I need clarity/input from Dr. Rogue Bussing and Dr. Bernardo Heater about zometa and leupron use in the future.   He had sig trunk pain and HA after prev Zometa infusion.  HA took about 2 weeks to get better.  Back pain got better after a few days.  In discussion with the patient, he was not enthused about continued Zometa use.  I thank you both for your input.

## 2021-04-26 NOTE — Assessment & Plan Note (Signed)
Differential diagnosis discussed with patient  COPD- no hx Asthma- no hx Allergies- possible contribution. GERD- no GERD sx other than some belching.  H/o esophagitis on prev EGD.  Intolerant of PPI previously. No on ACE but is on ARB.   Reasonable to check chest x-ray today, check BNP, add on Pepcid and he can let me know if he does not improve.  Okay for outpatient follow-up.  Nontoxic.  37 minutes were devoted to patient care in this encounter (this includes time spent reviewing the patient's file/history, interviewing and examining the patient, counseling/reviewing plan with patient).

## 2021-04-27 NOTE — Telephone Encounter (Signed)
Stoioff, Ronda Fairly, MD  You; Cammie Sickle, MD 32 minutes ago (10:35 AM)   It would be preferable to continue Zometa however I will defer to Dr. Rogue Bussing

## 2021-04-28 ENCOUNTER — Encounter: Payer: Self-pay | Admitting: *Deleted

## 2021-04-28 NOTE — Telephone Encounter (Signed)
App input from Dr. Rogue Bussing.   GSD.  -=================    Cammie Sickle, MD  You; Abbie Sons, MD; Gloris Ham, RN 13 hours ago (8:16 PM)   GB If Patient has significant concerns with Zometa-I think Zometa could be on hold/discontinued for now.  We can discuss alternative therapies-like denosumab at next visit.   Heather-please inform patient that it is reasonable to hold off Zometa at this time; and will discuss further options at the next visit.  Take him off the Zometa schedule.   GB

## 2021-05-01 ENCOUNTER — Telehealth: Payer: Self-pay | Admitting: Family Medicine

## 2021-05-01 NOTE — Telephone Encounter (Signed)
LVM for pt to rtn my call to schedule awv with nha. 

## 2021-05-24 ENCOUNTER — Other Ambulatory Visit: Payer: Self-pay | Admitting: *Deleted

## 2021-05-24 DIAGNOSIS — C61 Malignant neoplasm of prostate: Secondary | ICD-10-CM

## 2021-05-25 ENCOUNTER — Inpatient Hospital Stay: Payer: Medicare Other | Attending: Internal Medicine

## 2021-05-25 DIAGNOSIS — C61 Malignant neoplasm of prostate: Secondary | ICD-10-CM | POA: Diagnosis not present

## 2021-05-25 LAB — PSA: Prostatic Specific Antigen: <0.01 ng/mL (ref 0.00–4.00)

## 2021-06-02 ENCOUNTER — Encounter: Payer: Self-pay | Admitting: Radiation Oncology

## 2021-06-02 ENCOUNTER — Other Ambulatory Visit: Payer: Self-pay

## 2021-06-02 ENCOUNTER — Ambulatory Visit
Admission: RE | Admit: 2021-06-02 | Discharge: 2021-06-02 | Disposition: A | Discharge status: Home / Self Care | Payer: Medicare Other | Source: Ambulatory Visit | Attending: Radiation Oncology | Admitting: Radiation Oncology

## 2021-06-02 ENCOUNTER — Other Ambulatory Visit: Payer: Self-pay | Admitting: *Deleted

## 2021-06-02 VITALS — BP 160/76 | HR 61 | Temp 97.4°F | Resp 16 | Wt 242.0 lb

## 2021-06-02 DIAGNOSIS — C61 Malignant neoplasm of prostate: Secondary | ICD-10-CM | POA: Diagnosis not present

## 2021-06-02 DIAGNOSIS — Z923 Personal history of irradiation: Secondary | ICD-10-CM | POA: Diagnosis not present

## 2021-06-02 DIAGNOSIS — Z08 Encounter for follow-up examination after completed treatment for malignant neoplasm: Secondary | ICD-10-CM | POA: Diagnosis not present

## 2021-06-03 ENCOUNTER — Other Ambulatory Visit: Payer: Self-pay | Admitting: Radiation Oncology

## 2021-06-03 DIAGNOSIS — C61 Malignant neoplasm of prostate: Secondary | ICD-10-CM

## 2021-06-05 ENCOUNTER — Encounter: Payer: Self-pay | Admitting: Internal Medicine

## 2021-06-08 ENCOUNTER — Other Ambulatory Visit: Payer: Self-pay | Admitting: Cardiovascular Disease

## 2021-06-19 ENCOUNTER — Encounter: Payer: Self-pay | Admitting: Internal Medicine

## 2021-06-20 ENCOUNTER — Other Ambulatory Visit: Payer: Self-pay | Admitting: Urology

## 2021-06-21 ENCOUNTER — Other Ambulatory Visit: Payer: Self-pay | Admitting: *Deleted

## 2021-06-21 DIAGNOSIS — C61 Malignant neoplasm of prostate: Secondary | ICD-10-CM

## 2021-06-22 ENCOUNTER — Ambulatory Visit (INDEPENDENT_AMBULATORY_CARE_PROVIDER_SITE_OTHER): Payer: Medicare Other | Admitting: Internal Medicine

## 2021-06-22 ENCOUNTER — Other Ambulatory Visit: Payer: Self-pay

## 2021-06-22 ENCOUNTER — Encounter: Payer: Self-pay | Admitting: Internal Medicine

## 2021-06-22 VITALS — BP 140/78 | HR 63 | Ht 69.0 in | Wt 244.4 lb

## 2021-06-22 DIAGNOSIS — E1165 Type 2 diabetes mellitus with hyperglycemia: Secondary | ICD-10-CM | POA: Diagnosis not present

## 2021-06-22 DIAGNOSIS — E1159 Type 2 diabetes mellitus with other circulatory complications: Secondary | ICD-10-CM | POA: Diagnosis not present

## 2021-06-22 DIAGNOSIS — Z23 Encounter for immunization: Secondary | ICD-10-CM

## 2021-06-22 DIAGNOSIS — Z6839 Body mass index (BMI) 39.0-39.9, adult: Secondary | ICD-10-CM | POA: Diagnosis not present

## 2021-06-22 DIAGNOSIS — E785 Hyperlipidemia, unspecified: Secondary | ICD-10-CM

## 2021-06-22 DIAGNOSIS — I2510 Atherosclerotic heart disease of native coronary artery without angina pectoris: Secondary | ICD-10-CM

## 2021-06-22 LAB — POCT GLYCOSYLATED HEMOGLOBIN (HGB A1C): Hemoglobin A1C: 8.5 % — AB (ref 4.0–5.6)

## 2021-06-22 NOTE — Addendum Note (Signed)
Addended by: Lauralyn Primes on: 06/22/2021 11:19 AM   Modules accepted: Orders

## 2021-06-22 NOTE — Progress Notes (Signed)
Patient ID: Phillip Clements, male   DOB: 1951/05/30, 70 y.o.   MRN: [id number]  This visit occurred during the SARS-CoV-2 public health emergency.  Safety protocols were in place, including screening questions prior to the visit, additional usage of staff PPE, and extensive cleaning of exam room while observing appropriate contact time as indicated for disinfecting solutions.   HPI: Phillip Clements is a 70 y.o.-year-old male, returning for f/u for DM2, dx in 1996, insulin-dependent since 2008, uncontrolled, with complications (CAD - s/p stents, CABG, cerebro-vascular ds - s/p CVA, PVD). Last visit 4 months ago. He changed to [facility] in 10/2016.  Interim history: No blurry vision, nausea, chest pain.  He does have increased urination.  Reviewed HbA1c levels: Lab Results  Component Value Date   HGBA1C 9.8 (A) 02/28/2021   HGBA1C 8.2 (A) 11/01/2020   HGBA1C 8.2 (A) 07/01/2020   Pt was on a regimen of: - Invokana 300 mg before b'fast - Trulicity 1.5 mg weekly - Novolog 40 units 3x a day - 15 min before meals - Toujeo 120  units (60 x2) after dinner He tried Victoza and Januvia. Had GI intolerance (nausea, diarrhea) to regular Metformin and also with metformin ER >>stopped.  He was then on: - Invokana 300 mg before breakfast - Trulicity 1.5 mg weekly - U500 insulin 55-70 units 3 times a day before meals Also, if sugars in the morning are: - 160-200, take 10 units - 201-240, take 15 units - >240, take 20 units  He is currently on: - Invokana 300 mg bfore b'fast - Trulicity 1.5 >> 3  mg weekly  - stopped 2/2 diarrhea, nausea >> restarted at 1.5 mg weekly 02/2021 - restarted 2 weeks ago (retried  the 4.5 mg dose >> N/D >> stopped) - Tresiba 32 >> 44 units daily-started 10/2018 >> 66 >> 86 >> 100 units daily - NovoLog 10-16 >> 16-24 >> 20 units  (after dinner!)  >> 16 to 24 units before meals (actually taking 20 units only)  He is checking sugars once a day: - am: n/c >> 115-195, 226  >> 168-263 >> 123-210 >> 105-283, 359 - 2h after b'fast: n/c  - before lunch: 100-120 >> n/c >> 126-166 >> n/c >> 133-178 - 2h after lunch: n/c >> 170 - before dinner: 102 >> 110-169, 191 >> 120-311 (snack) >> 113-189 - 2h after dinner: 280 >> 224-269 >> 297, 367 >> 180s  - bedtime: 201, 214 >> 128-150 >> n/c>> 116-307 - nighttime: n/c 196-317 Lowest sugar was 56 >> ... 120; he has hypoglycemia awareness in the 70s. Highest sugar was  445 >> .Marland Kitchen.367 >> 300s >> 359  Glucometer: OneTouch Ultra mini  Pt's meals are mostly plant-based: - Breakfast:  Skips >> PB and apple - Lunch: sandwich, chips, cottage cheese, pineapple >> skips - Dinner: 2 veggies, salads; Sat night: meat >> grilled chicken and salad - Snacks: 2/day: celery + PB; low salt triscuit + laughing cow cheese  He continues to walk on the treadmill 2 out of 7 days at the gym and working outside.  + Mild CKD, last BUN/creatinine:  Lab Results  Component Value Date   BUN 29 (H) 03/27/2021   CREATININE 0.80 03/27/2021  On losartan. -+ HL: Lab Results  Component Value Date   CHOL 166 10/09/2019   HDL 42 10/09/2019   LDLCALC 96 10/09/2019   LDLDIRECT 157.1 09/07/2013   TRIG 138 10/09/2019   CHOLHDL 4.0 10/09/2019  He could not tolerate statins due  to joint pains.  He continues on Repatha and Zetia.  - last eye exam was in 05/2020: No DR reportedly; + history of cataract surgery. [address].  - no Numbness and tingling in his feet.  He has prostate cancer, believed to be metastatic to the bones.  On Lupron.  He finished radiation therapy.  ROS: + see HPI + fatigue  I reviewed pt's medications, allergies, PMH, social hx, family hx, and changes were documented in the history of present illness. Otherwise, unchanged from my initial visit note.  Past Medical History:  Diagnosis Date   Cataract    bilateral eye in 2010   Coronary artery disease    a. CABG 04/2014: (LIMA->LAD, VG->DIAG, VG->OM2, VG->PDA)  b.  07/2014 early graft failure (VG->OM2 60, LIMA->LAD atretic, VG->PDA & VG->D1 100). s/p successful rotational atherectomy & DES of the LM & pLAD (3.0x24 Promus DES) & mLAD (2.25x24 Promus DES).   CVA (cerebral infarction)    Family history of prostate cancer    Hx of adenomatous colonic polyps 06/17/2017   Hyperlipidemia    a. Statin intolerant.   Hypertension 2000   Moderate mitral regurgitation    a. 02/2015 Echo: EF 55-60%, no rwma, mod MR, mod BAE, mildly dil RV w/ low nl RV fxn.   OSA on CPAP    Peripheral vascular disease ([place])    a. left SFA angioplasty in December 2016   Prostate cancer [facility])    Sleep apnea    wears CPAP nightly   Type II diabetes mellitus [facility])    Past Surgical History:  Procedure Laterality Date   ANTERIOR CERVICAL DECOMP/DISCECTOMY FUSION  08/25/2012   Procedure: ANTERIOR CERVICAL DECOMPRESSION/DISCECTOMY FUSION 2 LEVELS;  Surgeon: Hosie Spangle, MD;  Location: [place] NEURO ORS;  Service: Neurosurgery;  Laterality: N/A;  Cervical five-six Cervical six-seven anterior cervical decompression with fusion and plating and bonegraft   BACK SURGERY     CARDIAC CATHETERIZATION  04/2014; 07/19/2014   CORONARY ARTERY BYPASS GRAFT N/A 04/26/2014   Procedure: CORONARY ARTERY BYPASS GRAFTING (CABG), on pump, times four, using left internal mammary artery, right greater saphenous vein harvested endoscopically.;  Surgeon: Ivin Poot, MD;  Location: [place];  Service: Open Heart Surgery;  Laterality: N/A;  LIMA to LAD, SVG to DIAGONAL, SVG to OM1, SVG to PDA) with EVH of the RIGHT THIGH and LOWER EXTREMITY SAPHENOUS VEIN   Cytoscopy prostatic stone o/w nml  06/21/08   Dr. Jacqlyn Larsen   ETT myoview  09/13/09   Low risk EF 49%   High intense focused ultrasound  07/20/06   By Dr. Jacqlyn Larsen   INTRAOPERATIVE TRANSESOPHAGEAL ECHOCARDIOGRAM N/A 04/26/2014   Procedure: INTRAOPERATIVE TRANSESOPHAGEAL ECHOCARDIOGRAM;  Surgeon: Ivin Poot, MD;  Location: [place];  Service: Open Heart Surgery;   Laterality: N/A;   PERCUTANEOUS CORONARY ROTOBLATOR INTERVENTION (PCI-R) N/A 07/22/2014   Procedure: PERCUTANEOUS CORONARY ROTOBLATOR INTERVENTION (PCI-R);  Surgeon: Peter M Martinique, MD;  Location: [facility] CATH LAB;  Service: Cardiovascular;  Laterality: N/A;   PERIPHERAL VASCULAR CATHETERIZATION Left 07/26/2015   Procedure: Lower Extremity Angiography;  Surgeon: Katha Cabal, MD;  Location: [place] CV LAB;  Service: Cardiovascular;  Laterality: Left;   PERIPHERAL VASCULAR CATHETERIZATION  07/26/2015   Procedure: Lower Extremity Intervention;  Surgeon: Katha Cabal, MD;  Location: [place] CV LAB;  Service: Cardiovascular;;   PROSTATE BIOPSY  04/03/06 & 02/25/07   Social History   Socioeconomic History   Marital status: Married    Spouse name: Not  on file   Number of children: 3   Years of education: Not on file   Highest education level: Not on file  Occupational History   Occupation: Scientist, 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  Tobacco Use   Smoking status: Former    Packs/day: 1.00    Years: 36.00    Pack years: 36.00    Types: Cigarettes    Quit date: 03/01/2014    Years since quitting: 7.3   Smokeless tobacco: Never  Vaping Use   Vaping Use: Never used  Substance and Sexual Activity   Alcohol use: Yes    Comment: 07/19/2014 "might have a drink a couple times/yr"   Drug use: No   Sexual activity: Not Currently  Other Topics Concern   Not on file  Social History Narrative   Lives with wife.; 2 daughters and 1 son.[employer] fan; semi-retd; Arboriculturist; Former Education officer, community, 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 In [place].    Social Determinants of Health   Financial Resource Strain: Not on file  Food Insecurity: Not on file  Transportation Needs: Not on file  Physical Activity: Not on file  Stress: Not on file  Social Connections: Not on file  Intimate Partner Violence: Not on file    Current Outpatient Medications on File Prior to Visit  Medication Sig Dispense Refill   aspirin EC 81 MG tablet Take 1 tablet (81 mg total) by mouth daily. 90 tablet 3   clopidogrel (PLAVIX) 75 MG tablet TAKE 1 TABLET BY MOUTH ONCE DAILY 90 tablet 1   Dulaglutide (TRULICITY) 1.5 UJ/8.1XB SOPN Inject 1.5 mg into the skin once a week. 2 mL 11   Evolocumab (REPATHA SURECLICK) 147 MG/ML SOAJ INJECT 1 PEN INTO THE SKIN EVERY 14 DAYS 2 mL 2   ezetimibe (ZETIA) 10 MG tablet Take 1 tablet (10 mg total) by mouth daily. 30 tablet 7   famotidine (PEPCID) 20 MG tablet Take 1 tablet (20 mg total) by mouth daily.     furosemide (LASIX) 20 MG tablet TAKE 1 TABLET BY MOUTH ONCE A DAY 90 tablet 1   glucose blood (ONETOUCH ULTRA) test strip CHECK BLOOD SUGAR TWICE A DAY - DX E11.59 200 strip 3   Injection Device (CORNWALL METAL PIPETTING) MISC Frequency:PHARMDIR   Dosage:0.0     Instructions:  Note:diagnosis 250.02 Dose: NA     insulin aspart (NOVOLOG FLEXPEN) 100 UNIT/ML FlexPen Inject 16-24 Units into the skin daily before supper. 30 mL 3   insulin degludec (TRESIBA FLEXTOUCH) 200 UNIT/ML FlexTouch Pen Inject 86 Units into the skin daily. 18 mL 3   Insulin Pen Needle 32G X 4 MM MISC Use 1x a day 100 each 3   INVOKANA 300 MG TABS tablet TAKE 1 TABLET BY MOUTH ONCE A DAY WITH BREAKFAST 90 tablet 3   leuprolide (LUPRON) 30 MG injection Inject 45 mg into the muscle every 6 (six) months.     losartan (COZAAR) 25 MG tablet Take 1 tablet (25 mg total) by mouth daily. 90 tablet 1   meclizine (ANTIVERT) 25 MG tablet Take 0.5-1 tablets (12.5-25 mg total) by mouth 2 (two) times daily as needed for dizziness or nausea. 30 tablet 0   MYRBETRIQ 50 MG TB24 tablet TAKE 1 TABLET BY MOUTH ONCE A DAY 30 tablet 12   nitroGLYCERIN (NITROSTAT) 0.4 MG SL tablet Place 1 tablet (0.4 mg total) under the tongue as needed. 25 tablet 2   tamsulosin (FLOMAX)  0.4 MG CAPS capsule TAKE 1 CAPSULE BY MOUTH ONCE DAILY AFTERSUPPER 30 capsule 11    No current facility-administered medications on file prior to visit.   Allergies  Allergen Reactions   Coreg [Carvedilol] Other (See Comments)    Fatigue- unclear if this was due to coreg specifically or beta blockers in general.     Metformin And Related Nausea Only   Protonix [Pantoprazole Sodium] Diarrhea   Rosuvastatin Calcium     REACTION: JOINT ACHES   Statins     REACTION: JOINT ACHES   Family History  Problem Relation Age of Onset   Diabetes Mother 37       DM   Coronary artery disease Mother    Hypertension Mother    Heart attack Mother        CABG with MI in 2005   Heart disease Mother        CV   Hypertension Father    Heart attack Father        CABG with Mi around 1997   Coronary artery disease Father    Heart disease Father        CV   Diabetes Father    Heart attack Brother        MI/PTCA   Heart disease Brother        CV   Diabetes Brother    Heart attack Maternal Grandfather        MI   Prostate cancer Paternal Grandfather 88       likely metastatic   Diabetes Sister        DM   Breast cancer Neg Hx        Breast/ovarian/uterine cancer   Colon cancer Neg Hx    Depression Neg Hx    Alcohol abuse Neg Hx        ETOH/drug abuse   Stroke Neg Hx    PE: BP 140/78 (BP Location: Right Arm, Patient Position: Sitting, Cuff Size: Normal)   Pulse 63   Ht 5\' 9"  (1.753 m)   Wt 244 lb 6.4 oz (110.9 kg)   SpO2 98%   BMI 36.09 kg/m  Body mass index is 36.09 kg/m. Wt Readings from Last 3 Encounters:  06/22/21 244 lb 6.4 oz (110.9 kg)  06/02/21 242 lb (109.8 kg)  04/25/21 240 lb (108.9 kg)   Constitutional: overweight, in NAD Eyes: PERRLA, EOMI, no exophthalmos ENT: moist mucous membranes, no thyromegaly, no cervical lymphadenopathy Cardiovascular: RRR, No MRG Respiratory: CTA B Gastrointestinal: abdomen soft, NT, ND, BS+ Musculoskeletal: no deformities, strength intact in all 4 Skin: moist, warm, no rashes Neurological: no tremor with  outstretched hands, DTR normal in all 4  ASSESSMENT: 1. DM2, insulin-dependent, uncontrolled, with complications - CAD - s/p stents, CABG - cerebro-vascular ds - s/p CVA  - PVD - s/p L stent  2. HL  3.  Obesity class II BMI Classification: < 18.5 underweight  18.5-24.9 normal weight  25.0-29.9 overweight  30.0-34.9 class I obesity  35.0-39.9 class II obesity  ? 40.0 class III obesity   4.  Statin intolerance  PLAN:  1. Patient with longstanding, insulin resistant diabetes, previously on U500 insulin, but then off insulin after improvement of his sugars while on Invokana and Trulicity.  In 2021, he relaxed his diet, gained a significant amount of weight, and sugars worsened so we had to start back on insulin.  At last visit, he was off Trulicity after he developed GI symptoms (nausea, diarrhea) in 11/2020.  We have increased the dose of Tresiba before last visit and at last visit I also advised him to vary the dose of NovoLog more based on the size and consistency of his meals and to take it correctly, 15 minutes before a meal.  I also advised him to try a lower dose of Trulicity, 1.5 mg weekly.  He previously tolerated this dose well.  HbA1c at that time was higher, at 9.8%. -At this visit, per review of his log, his sugars are very variable, with sugars in the 300s, but without lows.  Unfortunately, due to the high variability of blood sugars especially in the morning and at bedtime, it is difficult to make changes in his regimen.  Upon questioning, he increase the dose of Tresiba to 100 units daily and we can continue this for now.  He is now back on Trulicity for the last 2 weeks, which should also help but it does need another week to build up in his system.  He is not taking NovoLog as recommended, varying the dose depending on the meal, but takes 20 units before each meal.  We discussed that he needs to start varying the dose based on the sugars before the meal and consistency of the  meal.  He agrees to start doing so. -Ultimately, I did suggest a CGM, and he mentioned that he was not interested in one.  I explained how it works and how it would help US obtain better control of his diabetes and he agreed to try the freestyle libre CGM.  I showed him the device and explained how this works.  I gave him a list of suppliers to contact.  This will definitely make an impact in his diabetes control, if he is able to obtain it. - I suggested to: Patient Instructions  Please continue: - Invokana  300 mg bfore b'fast - Trulicity 1.5 mg weekly - Tresiba 100 units daily   Please change: - NovoLog 16-24 units 15 min before your main meals  The most common suppliers for the  [facility] continuous glucose monitor (CGM) are: [place] [employer]: [place]: [phone] Ext [phone] [employer]: [place]: [place]: [place]: [phone]  Please return in 4 months with your sugar log/CGM.   - we checked his HbA1c: 8.5% (lower) - advised to check sugars at different times of the day - 4x a day, rotating check times - advised for yearly eye exams >> he is not UTD - return to clinic in 4 months  2. HL -Reviewed latest lipid panel from 09/2019: LDL above target, the rest of the fractions at goal: Lab Results  Component Value Date   CHOL 166 10/09/2019   HDL 42 10/09/2019   LDLCALC 96 10/09/2019   LDLDIRECT 157.1 09/07/2013   TRIG 138 10/09/2019   CHOLHDL 4.0 10/09/2019  -He is intolerant to statins.  However, he is on Repatha and ezetimibe 10 mg daily without side effects -He is due for another lipid panel - he prefers to wait until he sees cardiology to have this rechecked  3. Obesity -Also contributed to by the Lupron injections -Previously lost a significant amount of weight (35 pounds) on a keto diet.  Weight fluctuating afterwards, but then he gained 20 pounds.  He  gained 3 pounds before last visit. -continue SGLT 2 inhibitor and GLP-1 receptor agonist which should also help with weight loss.  Since last visit he tried again the higher  dose Trulicity but he again developed GI symptoms and could not tolerate it.  He was actually off Trulicity completely until 2 weeks ago, when he restarted the 1.5 mg dose that he continues this weekly with good tolerance now. -Weight at last visit: 250 pounds.  Lost 6 pounds since then.  4.  Statin intolerance -He had joint aches before with statin use  -On Repatha now  Philemon Kingdom, MD PhD [facility] Endocrinology

## 2021-06-22 NOTE — Patient Instructions (Addendum)
Please continue: - Invokana  300 mg bfore b'fast - Trulicity 1.5 mg weekly - Tresiba 100 units daily   Please change: - NovoLog 16-24 units 15 min before your main meals  The most common suppliers for the  [facility] continuous glucose monitor (CGM) are: [place] [employer]: [place]: [phone] Ext [phone] [employer]: [place]: [place]: [phone] [place]: [phone]  Please return in 4 months with your sugar log/CGM.

## 2021-06-26 ENCOUNTER — Ambulatory Visit: Payer: Medicare Other

## 2021-06-26 ENCOUNTER — Encounter: Payer: Self-pay | Admitting: Internal Medicine

## 2021-06-26 ENCOUNTER — Inpatient Hospital Stay (HOSPITAL_BASED_OUTPATIENT_CLINIC_OR_DEPARTMENT_OTHER): Payer: Medicare Other | Admitting: Internal Medicine

## 2021-06-26 ENCOUNTER — Inpatient Hospital Stay: Payer: Medicare Other | Attending: Internal Medicine

## 2021-06-26 ENCOUNTER — Other Ambulatory Visit: Payer: Self-pay

## 2021-06-26 ENCOUNTER — Ambulatory Visit: Payer: Self-pay

## 2021-06-26 DIAGNOSIS — E119 Type 2 diabetes mellitus without complications: Secondary | ICD-10-CM | POA: Diagnosis not present

## 2021-06-26 DIAGNOSIS — C61 Malignant neoplasm of prostate: Secondary | ICD-10-CM | POA: Diagnosis not present

## 2021-06-26 DIAGNOSIS — C7951 Secondary malignant neoplasm of bone: Secondary | ICD-10-CM | POA: Insufficient documentation

## 2021-06-26 LAB — COMPREHENSIVE METABOLIC PANEL
ALT: 21 U/L (ref 0–44)
AST: 18 U/L (ref 15–41)
Albumin: 3.8 g/dL (ref 3.5–5.0)
Alkaline Phosphatase: 63 U/L (ref 38–126)
Anion gap: 9 (ref 5–15)
BUN: 23 mg/dL (ref 8–23)
CO2: 26 mmol/L (ref 22–32)
Calcium: 8.3 mg/dL — ABNORMAL LOW (ref 8.9–10.3)
Chloride: 99 mmol/L (ref 98–111)
Creatinine, Ser: 0.92 mg/dL (ref 0.61–1.24)
GFR, Estimated: >60 mL/min (ref >60)
Glucose, Bld: 338 mg/dL — ABNORMAL HIGH (ref 70–99)
Potassium: 4.1 mmol/L (ref 3.5–5.1)
Sodium: 134 mmol/L — ABNORMAL LOW (ref 135–145)
Total Bilirubin: 0.7 mg/dL (ref 0.3–1.2)
Total Protein: 6.9 g/dL (ref 6.5–8.1)

## 2021-06-26 LAB — CBC WITH DIFFERENTIAL/PLATELET
Abs Immature Granulocytes: 0.05 10*3/uL (ref 0.00–0.07)
Basophils Absolute: 0 10*3/uL (ref 0.0–0.1)
Basophils Relative: 1 %
Eosinophils Absolute: 0.2 10*3/uL (ref 0.0–0.5)
Eosinophils Relative: 3 %
HCT: 45 % (ref 39.0–52.0)
Hemoglobin: 15.3 g/dL (ref 13.0–17.0)
Immature Granulocytes: 1 %
Lymphocytes Relative: 23 %
Lymphs Abs: 1.4 10*3/uL (ref 0.7–4.0)
MCH: 28.7 pg (ref 26.0–34.0)
MCHC: 34 g/dL (ref 30.0–36.0)
MCV: 84.4 fL (ref 80.0–100.0)
Monocytes Absolute: 0.4 10*3/uL (ref 0.1–1.0)
Monocytes Relative: 7 %
Neutro Abs: 3.7 10*3/uL (ref 1.7–7.7)
Neutrophils Relative %: 65 %
Platelets: 169 10*3/uL (ref 150–400)
RBC: 5.33 MIL/uL (ref 4.22–5.81)
RDW: 13.4 % (ref 11.5–15.5)
WBC: 5.8 10*3/uL (ref 4.0–10.5)
nRBC: 0 % (ref 0.0–0.2)

## 2021-06-26 LAB — PSA: Prostatic Specific Antigen: <0.01 ng/mL (ref 0.00–4.00)

## 2021-06-26 NOTE — Progress Notes (Signed)
Phillip Clements CONSULT NOTE  Patient Care Team: Tonia Ghent, MD as PCP - General (Family Medicine) Wellington Hampshire, MD as PCP - Cardiology (Cardiology)  CHIEF COMPLAINTS/PURPOSE OF CONSULTATION: Prostate cancer  #  Oncology History Overview Note  1. Prostate adenocarcinoma  a. 02/2006 PSA returned elevated at 17.49  b. 04/03/2006; PSA 24.7; prostate biopsy Gleason 3+3 with 4/4 cores c. tx with HIFU overseas late 2007 in [place] [Dr.Cope]] d. persistent PSA elevation over period 2007-2010; nadir 2.1 09/16/06, peak 15.0 ng/mL  e. 04/2006 bone scan and CT abd/pelvis without evidence of metastasis f. 03/08/2007 CT abdomen/pelvis with no definitive metastatic disease noted  g. 03/18/2007 prostascint scan with no evidence of disease but small pelvic lymph nodes noted  h. 08/15/2009 prostascint scan with no evidence of local prostate cancer or distant metastases  i. 08/22/2009 PSA 15 ng/mL; 02/06/2010 PSA 20.5 ng/mL  j. 03/08/2010 PSA 20.9 ng/mL  k. 06/02/10 initiation of degarelix  l. 05/25/10 PET CT (Fluoride) revealed heterogenous areas of FDG-avidity in ribs, femurs, humeri, areas also in the metatarsals noted which may have been arthiritic in nature  m. 07/03/10 PSA 9.6 ng/mL  o.  Started Lupron/Casodex; June 2020-PSA from 0.2 to 0.3 -I;  As per patient-Casodex was withdrawn-but noted to have a jump of PSA to 0.9. FEB 2021-evidence of prostatic malignancy in the residual prostate bed; no distant metastatic disease   # FEB 2021-PET scan uptake in prostate; no bone mets.-EBRT to prostate [finished end of May 2021]; October 2021 PET scan improved prostate uptake; no evidence metastatic disease.  # EARLY NOV-apalutamide 3pills [pt pref]; STOPPED prior to christmas, 2021 [fatigue/?diarrhea]  #comorbidities-CAD s/p CABG; diabetes; PVD  # Prostate cancer-at age of 70 s/p-genetic counseling-NEG.   # NGS/MOLECULAR TESTS:NA  # PALLIATIVE CARE EVALUATION: NA  # PAIN MANAGEMENT:  NA   DIAGNOSIS: [name]  STAGE:    4     ;  GOALS: Palliative/control  CURRENT/MOST RECENT THERAPY :     Prostate cancer ([place])  09/16/2007 Initial Diagnosis   Prostate cancer ([place])   01/22/2020 Genetic Testing   Negative genetic testing:  No pathogenic variants detected on the [facility] Multi-Cancer panel. The report date is 01/22/2020.   The Common Hereditary Cancers Panel offered by [facility] includes sequencing and/or deletion duplication testing of the following 48 genes: APC, ATM, AXIN2, BARD1, BMPR1A, BRCA1, BRCA2, BRIP1, CDH1, CDK4, CDKN2A (p14ARF), CDKN2A (p16INK4a), CHEK2, CTNNA1, DICER1, EPCAM (Deletion/duplication testing only), GREM1 (promoter region deletion/duplication testing only), KIT, MEN1, MLH1, MSH2, MSH3, MSH6, MUTYH, NBN, NF1, NTHL1, PALB2, PDGFRA, PMS2, POLD1, POLE, PTEN, RAD50, RAD51C, RAD51D, RNF43, SDHB, SDHC, SDHD, SMAD4, SMARCA4. STK11, TP53, TSC1, TSC2, and VHL.  The following genes were evaluated for sequence changes only: SDHA and HOXB13 c.251G>A variant only.     HISTORY OF PRESENTING ILLNESS: Patient is ambulating independently.  Accompanied by his wife. Phillip Clements 70 y.o.  male with history of castrate resistant prostate cancer with Hx of  metastatic to bone/Eligard-is here for follow-up.  Patient received Zometa infusion approximately 3 months ago.  Noted to have bone pain and joint pains headaches postinfusion.   He also is interested in holding off his 50-month[facility] SalivaDr.Stoioff].  Mild to moderate fatigue. Patient denies any worsening shortness of breath or cough.  Denies any chills or fevers.     Review of Systems  Constitutional:  Positive for malaise/fatigue. Negative for chills, diaphoresis, fever and weight loss.  HENT:  Negative for nosebleeds  and sore throat.   Eyes:  Negative for double vision.  Respiratory:  Negative for cough, hemoptysis, sputum production, shortness of breath and wheezing.    Cardiovascular:  Negative for chest pain, palpitations, orthopnea and leg swelling.  Gastrointestinal:  Negative for abdominal pain, blood in stool, constipation, diarrhea, heartburn, melena, nausea and vomiting.  Genitourinary:  Positive for frequency and urgency.  Musculoskeletal:  Positive for back pain. Negative for joint pain.  Skin: Negative.  Negative for itching and rash.  Neurological:  Negative for dizziness, tingling, focal weakness, weakness and headaches.  Endo/Heme/Allergies:  Does not bruise/bleed easily.  Psychiatric/Behavioral:  Negative for depression. The patient is not nervous/anxious and does not have insomnia.     MEDICAL HISTORY:  Past Medical History:  Diagnosis Date   Cataract    bilateral eye in 2010   Coronary artery disease    a. CABG 04/2014: (LIMA->LAD, VG->DIAG, VG->OM2, VG->PDA)  b. 07/2014 early graft failure (VG->OM2 60, LIMA->LAD atretic, VG->PDA & VG->D1 100). s/p successful rotational atherectomy & DES of the LM & pLAD (3.0x24 Promus DES) & mLAD (2.25x24 Promus DES).   CVA (cerebral infarction)    Family history of prostate cancer    Hx of adenomatous colonic polyps 06/17/2017   Hyperlipidemia    a. Statin intolerant.   Hypertension 2000   Moderate mitral regurgitation    a. 02/2015 Echo: EF 55-60%, no rwma, mod MR, mod BAE, mildly dil RV w/ low nl RV fxn.   OSA on CPAP    Peripheral vascular disease ([place])    a. left SFA angioplasty in December 2016   Prostate cancer [facility])    Sleep apnea    wears CPAP nightly   Type II diabetes mellitus ([place])     SURGICAL HISTORY: Past Surgical History:  Procedure Laterality Date   ANTERIOR CERVICAL DECOMP/DISCECTOMY FUSION  08/25/2012   Procedure: ANTERIOR CERVICAL DECOMPRESSION/DISCECTOMY FUSION 2 LEVELS;  Surgeon: Hosie Spangle, MD;  Location: [place] NEURO ORS;  Service: Neurosurgery;  Laterality: N/A;  Cervical five-six Cervical six-seven anterior cervical decompression with fusion and plating and bonegraft    BACK SURGERY     CARDIAC CATHETERIZATION  04/2014; 07/19/2014   CORONARY ARTERY BYPASS GRAFT N/A 04/26/2014   Procedure: CORONARY ARTERY BYPASS GRAFTING (CABG), on pump, times four, using left internal mammary artery, right greater saphenous vein harvested endoscopically.;  Surgeon: Ivin Poot, MD;  Location: [place];  Service: Open Heart Surgery;  Laterality: N/A;  LIMA to LAD, SVG to DIAGONAL, SVG to OM1, SVG to PDA) with EVH of the RIGHT THIGH and LOWER EXTREMITY SAPHENOUS VEIN   Cytoscopy prostatic stone o/w nml  06/21/08   Dr. Jacqlyn Larsen   ETT myoview  09/13/09   Low risk EF 49%   High intense focused ultrasound  07/20/06   By Dr. Jacqlyn Larsen   INTRAOPERATIVE TRANSESOPHAGEAL ECHOCARDIOGRAM N/A 04/26/2014   Procedure: INTRAOPERATIVE TRANSESOPHAGEAL ECHOCARDIOGRAM;  Surgeon: Ivin Poot, MD;  Location: [place];  Service: Open Heart Surgery;  Laterality: N/A;   PERCUTANEOUS CORONARY ROTOBLATOR INTERVENTION (PCI-R) N/A 07/22/2014   Procedure: PERCUTANEOUS CORONARY ROTOBLATOR INTERVENTION (PCI-R);  Surgeon: Peter M Martinique, MD;  Location: [facility] CATH LAB;  Service: Cardiovascular;  Laterality: N/A;   PERIPHERAL VASCULAR CATHETERIZATION Left 07/26/2015   Procedure: Lower Extremity Angiography;  Surgeon: Katha Cabal, MD;  Location: [place] CV LAB;  Service: Cardiovascular;  Laterality: Left;   PERIPHERAL VASCULAR CATHETERIZATION  07/26/2015   Procedure: Lower Extremity Intervention;  Surgeon: Katha Cabal, MD;  Location:  [place] CV LAB;  Service: Cardiovascular;;   PROSTATE BIOPSY  04/03/06 & 02/25/07    SOCIAL HISTORY: Social History   Socioeconomic History   Marital status: Married    Spouse name: Not on file   Number of children: 3   Years of education: Not on file   Highest education level: Not on file  Occupational History   Occupation: Scientist, 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  Tobacco Use   Smoking status: Former    Packs/day: 1.00    Years: 36.00    Pack years:  36.00    Types: Cigarettes    Quit date: 03/01/2014    Years since quitting: 7.3   Smokeless tobacco: Never  Vaping Use   Vaping Use: Never used  Substance and Sexual Activity   Alcohol use: Yes    Comment: 07/19/2014 "might have a drink a couple times/yr"   Drug use: No   Sexual activity: Not Currently  Other Topics Concern   Not on file  Social History Narrative   Lives with wife.; 2 daughters and 1 son.[employer] fan; semi-retd; Arboriculturist; Former Education officer, community, 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 In [place].    Social Determinants of Health   Financial Resource Strain: Not on file  Food Insecurity: Not on file  Transportation Needs: Not on file  Physical Activity: Not on file  Stress: Not on file  Social Connections: Not on file  Intimate Partner Violence: Not on file    FAMILY HISTORY: Family History  Problem Relation Age of Onset   Diabetes Mother 91       DM   Coronary artery disease Mother    Hypertension Mother    Heart attack Mother        CABG with MI in 2005   Heart disease Mother        CV   Hypertension Father    Heart attack Father        CABG with Mi around 1997   Coronary artery disease Father    Heart disease Father        CV   Diabetes Father    Heart attack Brother        MI/PTCA   Heart disease Brother        CV   Diabetes Brother    Heart attack Maternal Grandfather        MI   Prostate cancer Paternal Grandfather 85       likely metastatic   Diabetes Sister        DM   Breast cancer Neg Hx        Breast/ovarian/uterine cancer   Colon cancer Neg Hx    Depression Neg Hx    Alcohol abuse Neg Hx        ETOH/drug abuse   Stroke Neg Hx     ALLERGIES:  is allergic to coreg [carvedilol], metformin and related, protonix [pantoprazole sodium], rosuvastatin calcium, and statins.  MEDICATIONS:  Current Outpatient Medications  Medication Sig Dispense Refill   aspirin EC 81 MG  tablet Take 1 tablet (81 mg total) by mouth daily. 90 tablet 3   clopidogrel (PLAVIX) 75 MG tablet TAKE 1 TABLET BY MOUTH ONCE DAILY 90 tablet 1   Dulaglutide (TRULICITY) 1.5 PJ/0.9TO SOPN Inject 1.5 mg into the skin once a week. 2 mL 11   ezetimibe (ZETIA) 10 MG tablet Take 1 tablet (10 mg total) by mouth daily. 30 tablet  7   famotidine (PEPCID) 20 MG tablet Take 1 tablet (20 mg total) by mouth daily.     furosemide (LASIX) 20 MG tablet TAKE 1 TABLET BY MOUTH ONCE A DAY 90 tablet 1   glucose blood (ONETOUCH ULTRA) test strip CHECK BLOOD SUGAR TWICE A DAY - DX E11.59 200 strip 3   insulin aspart (NOVOLOG FLEXPEN) 100 UNIT/ML FlexPen Inject 16-24 Units into the skin daily before supper. 30 mL 3   insulin degludec (TRESIBA FLEXTOUCH) 200 UNIT/ML FlexTouch Pen Inject 86 Units into the skin daily. (Patient taking differently: Inject 100 Units into the skin daily.) 18 mL 3   Insulin Pen Needle 32G X 4 MM MISC Use 1x a day 100 each 3   INVOKANA 300 MG TABS tablet TAKE 1 TABLET BY MOUTH ONCE A DAY WITH BREAKFAST 90 tablet 3   leuprolide (LUPRON) 30 MG injection Inject 45 mg into the muscle every 6 (six) months.     losartan (COZAAR) 25 MG tablet Take 1 tablet (25 mg total) by mouth daily. 90 tablet 1   meclizine (ANTIVERT) 25 MG tablet Take 0.5-1 tablets (12.5-25 mg total) by mouth 2 (two) times daily as needed for dizziness or nausea. 30 tablet 0   MYRBETRIQ 50 MG TB24 tablet TAKE 1 TABLET BY MOUTH ONCE A DAY 30 tablet 12   nitroGLYCERIN (NITROSTAT) 0.4 MG SL tablet Place 1 tablet (0.4 mg total) under the tongue as needed. 25 tablet 2   tamsulosin (FLOMAX) 0.4 MG CAPS capsule TAKE 1 CAPSULE BY MOUTH ONCE DAILY AFTERSUPPER 30 capsule 11   Evolocumab (REPATHA SURECLICK) 935 MG/ML SOAJ INJECT 1 PEN INTO THE SKIN EVERY 14 DAYS (Patient not taking: Reported on 06/26/2021) 2 mL 2   Injection Device ([place]) MISC Frequency:PHARMDIR   Dosage:0.0     Instructions:  Note:diagnosis 250.02 Dose: NA      No current facility-administered medications for this visit.      Marland Kitchen  PHYSICAL EXAMINATION: ECOG PERFORMANCE STATUS: 0 - Asymptomatic  Vitals:   06/26/21 1058  BP: (!) 153/68  Pulse: 65  Resp: 17  Temp: 98.4 F (36.9 C)  SpO2: 100%   Filed Weights   06/26/21 1058  Weight: 246 lb (111.6 kg)    Physical Exam HENT:     Head: Normocephalic and atraumatic.     Mouth/Throat:     Pharynx: No oropharyngeal exudate.  Eyes:     Pupils: Pupils are equal, round, and reactive to light.  Cardiovascular:     Rate and Rhythm: Normal rate and regular rhythm.  Pulmonary:     Effort: Pulmonary effort is normal. No respiratory distress.     Breath sounds: Normal breath sounds. No wheezing.  Abdominal:     General: Bowel sounds are normal. There is no distension.     Palpations: Abdomen is soft. There is no mass.     Tenderness: There is no abdominal tenderness. There is no guarding or rebound.  Musculoskeletal:        General: No tenderness. Normal range of motion.     Cervical back: Normal range of motion and neck supple.  Skin:    General: Skin is warm.  Neurological:     Mental Status: He is alert and oriented to person, place, and time.  Psychiatric:        Mood and Affect: Affect normal.    LABORATORY DATA:  I have reviewed the data as listed Lab Results  Component Value Date   WBC  5.8 06/26/2021   HGB 15.3 06/26/2021   HCT 45.0 06/26/2021   MCV 84.4 06/26/2021   PLT 169 06/26/2021   Recent Labs    07/28/20 0922 08/19/20 0939 08/26/20 0923 11/29/20 0843 12/08/20 1253 03/27/21 1009 06/26/21 1019  NA 139 142   < > 135 134* 135 134*  K 4.7 4.6   < > 4.3 4.5 4.3 4.1  CL 102 105   < > 98 100 104 99  CO2 19* 20   < > 28 26 24 26   GLUCOSE 290* 236*   < > 318* 360* 179* 338*  BUN 22 23   < > 27* 23 29* 23  CREATININE 0.88 0.95   < > 1.18 1.05 0.80 0.92  CALCIUM 9.0 8.9   < > 9.4 8.1* 8.6* 8.3*  GFRNONAA 88 81   < > >60 >60 >60 >60  GFRAA 101 94  --   --    --   --   --   PROT  --   --    < > 7.2  --  7.1 6.9  ALBUMIN  --   --    < > 4.1  --  4.3 3.8  AST  --   --    < > 15  --  16 18  ALT  --   --    < > 22  --  18 21  ALKPHOS  --   --    < > 77  --  63 63  BILITOT  --   --    < > 1.3*  --  1.1 0.7   < > = values in this interval not displayed.    RADIOGRAPHIC STUDIES: I have personally reviewed the radiological images as listed and agreed with the findings in the report. No results found.  ASSESSMENT & PLAN:   Prostate cancer ([place]) # STAGE-IV-Prostate cancer-metastatic to bone/castrate resistant. S/P- EBRT to prostate [last Tx- on 01/08/2020]. PET Oct 10th, 2021-Mild asymmetric activity within the prostate gland decreased from comparison from Feb 2021; No convincing evidence of nodal metastasis. No visceral metastasis or skeletal metastasis. Currently off- Darolutamide [sec to poor tolerance].   OCT 2022- <0.01.   #Given patient's preference/side effects-overall steady PSA less than 0.1 I think is reasonable to hold off Eligard at this time [facility]; due Nov 2022].  ].  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  # DM-2 on insulin/OHA- BG 338 [PBF]-again discussed importance of tight blood glucose control.  # DISPOSITION: my chart #  Follow up in 3 months; MD- labs- cbc/cmp/PSA; Dr.B   All questions were answered. The patient knows to call the clinic with any problems, questions or concerns.    Cammie Sickle, MD 06/26/2021 11:25 AM

## 2021-06-26 NOTE — Assessment & Plan Note (Addendum)
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 S/P- EBRT to prostate [last Tx- on 01/08/2020]. PET Oct 10th, 2021-Mild asymmetric activity within the prostate gland decreased from comparison from Feb 2021; No convincing evidence of nodal metastasis. No visceral metastasis or skeletal metastasis. Currently off- Darolutamide [sec to poor tolerance].   OCT 2022- <0.01.   #Given patient's preference/side effects-overall steady PSA less than 0.1 I think is reasonable to hold off Eligard at this time  [facility]; due Nov 2022].  ].  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  # DM-2 on insulin/OHA- BG 338 [PBF]-again discussed importance of tight blood glucose control.  # DISPOSITION: my chart #  Follow up in 3 months; MD- labs- cbc/cmp/PSA; Dr.B

## 2021-06-26 NOTE — Progress Notes (Signed)
Patient here for oncology follow-up appointment, expresses no complaints or concerns at this time.    

## 2021-06-27 NOTE — Progress Notes (Signed)
Radiation Oncology Follow up Note  Name: Phillip Clements   Date:   06/02/2021 MRN:  [id number] DOB: 09/27/1950    This 70 y.o. male presents to the clinic today for 35-month follow-up status post salvage radiation therapy after HIFU for Gleason 6 adenocarcinoma prostate originally presented with a PSA of 24.  REFERRING PROVIDER: Tonia Ghent, MD  HPI: Patient is a 70 year old male now out 16 months status post salvage radiation therapy for Gleason 6 adenocarcinoma the prostate originally presented with a PSA of 24.  Urologic originally treated with HIFU therapy.Marland Kitchen  His PSA has remained in the undetectable range of less than 0.01.  He is now off Eligard therapy.  He is otherwise doing well minor lower urinary tract side effects no significant diarrhea.  COMPLICATIONS OF TREATMENT: none  FOLLOW UP COMPLIANCE: keeps appointments   PHYSICAL EXAM:  BP (!) 160/76   Pulse 61   Temp (!) 97.4 F (36.3 C) (Tympanic)   Resp 16   Wt 242 lb (109.8 kg)   BMI 35.74 kg/m  Well-developed well-nourished patient in NAD. HEENT reveals PERLA, EOMI, discs not visualized.  Oral cavity is clear. No oral mucosal lesions are identified. Neck is clear without evidence of cervical or supraclavicular adenopathy. Lungs are clear to A&P. Cardiac examination is essentially unremarkable with regular rate and rhythm without murmur rub or thrill. Abdomen is benign with no organomegaly or masses noted. Motor sensory and DTR levels are equal and symmetric in the upper and lower extremities. Cranial nerves II through XII are grossly intact. Proprioception is intact. No peripheral adenopathy or edema is identified. No motor or sensory levels are noted. Crude visual fields are within normal range.  RADIOLOGY RESULTS: No current films for review  PLAN: Present time patient is under excellent biochemical control of his prostate cancer.  Pleased with his overall progress.  I have asked to see him back in 1 year for  follow-up.  He continues follow-up care with medical oncology.  I would like to take this opportunity to thank you for allowing me to participate in the care of your patient.Noreene Filbert, MD

## 2021-07-26 ENCOUNTER — Telehealth: Payer: Self-pay

## 2021-07-26 NOTE — Telephone Encounter (Signed)
PA started through [employer] for Repatha.   Phillip Clements (Key: [id number]) Repatha SureClick 140MG /ML auto-injectors  Upon completion of the PA I received the message  " Authorization already on file for this request. Authorization starting on 08/13/2020 and ending on 08/12/2022."

## 2021-07-28 ENCOUNTER — Other Ambulatory Visit: Payer: Self-pay | Admitting: Cardiovascular Disease

## 2021-07-28 NOTE — Telephone Encounter (Signed)
Please schedule 6 month F/U appointment. Thank you! 

## 2021-08-17 ENCOUNTER — Other Ambulatory Visit: Payer: Self-pay

## 2021-08-17 ENCOUNTER — Ambulatory Visit (INDEPENDENT_AMBULATORY_CARE_PROVIDER_SITE_OTHER): Payer: Medicare Other | Admitting: Cardiovascular Disease

## 2021-08-17 ENCOUNTER — Encounter: Payer: Self-pay | Admitting: Cardiovascular Disease

## 2021-08-17 VITALS — BP 136/68 | HR 69 | Ht 69.0 in | Wt 248.1 lb

## 2021-08-17 DIAGNOSIS — E785 Hyperlipidemia, unspecified: Secondary | ICD-10-CM | POA: Diagnosis not present

## 2021-08-17 DIAGNOSIS — I10 Essential (primary) hypertension: Secondary | ICD-10-CM

## 2021-08-17 DIAGNOSIS — I2510 Atherosclerotic heart disease of native coronary artery without angina pectoris: Secondary | ICD-10-CM

## 2021-08-17 DIAGNOSIS — I739 Peripheral vascular disease, unspecified: Secondary | ICD-10-CM | POA: Diagnosis not present

## 2021-08-17 DIAGNOSIS — R0989 Other specified symptoms and signs involving the circulatory and respiratory systems: Secondary | ICD-10-CM

## 2021-08-17 NOTE — Progress Notes (Signed)
Cardiology Office Note   Date:  08/17/2021   ID:  Phillip Clements, DOB 02-08-1951, MRN [id number]  PCP:  Tonia Ghent, MD  Cardiologist:   [name], MD   Chief Complaint  Patient presents with   Other    6 month f/u c/o sob. Meds reviewed verbally with pt.      History of Present Illness: Phillip Clements is a 71 y.o. male who presents for a followup visit regarding coronary artery disease .  He has known history of diabetes for at least 25 years of duration which has not been optimally controlled. He also has other medical conditions that include  Hyperlipidemia, PAD status post left SFA angioplasty in December 2016,  tobacco use, obesity and prostate cancer.  He had CABG in September 2015 for severe three-vessel coronary artery disease.  Ejection fraction was 45%.  He developed recurrent angina  in November, 2015. He underwent a nuclear stress test which was abnormal. He had inferolateral ST elevation with exercise. Nuclear imaging showed evidence of inferior as well as anterior ischemia. Repeat cardiac catheterization showed occluded grafts except for SVG to OM 2. LIMA to LAD was atretic. Ejection fraction was 55%. He underwent atherectomy and stenting of the left main and LAD . He is intolerant to statins. He is on Zetia and Repatha.    Most recent echocardiogram in September 2021 showed normal LV systolic function with calcified aortic valve without significant stenosis.  Lexiscan Myoview in December 2021 showed inferior and inferolateral infarct without significant ischemia. Lower extremity arterial Doppler studies in December 2021 showed normal ABI bilaterally.  Duplex showed patent left SFA with mildly elevated velocity.  During last visit, I discontinued small dose carvedilol and increase losartan. He reports some shortness of breath and burning sensation yesterday with walking but symptoms lasted only for few minutes.  The symptoms do not happen on a regular  basis.  He has been under stress as he wants to sell his old [place] in [place] and move to the new property he bought in [place].  Past Medical History:  Diagnosis Date   Cataract    bilateral eye in 2010   Coronary artery disease    a. CABG 04/2014: (LIMA->LAD, VG->DIAG, VG->OM2, VG->PDA)  b. 07/2014 early graft failure (VG->OM2 60, LIMA->LAD atretic, VG->PDA & VG->D1 100). s/p successful rotational atherectomy & DES of the LM & pLAD (3.0x24 Promus DES) & mLAD (2.25x24 Promus DES).   CVA (cerebral infarction)    Family history of prostate cancer    Hx of adenomatous colonic polyps 06/17/2017   Hyperlipidemia    a. Statin intolerant.   Hypertension 2000   Moderate mitral regurgitation    a. 02/2015 Echo: EF 55-60%, no rwma, mod MR, mod BAE, mildly dil RV w/ low nl RV fxn.   OSA on CPAP    Peripheral vascular disease ([place])    a. left SFA angioplasty in December 2016   Prostate cancer [facility])    Sleep apnea    wears CPAP nightly   Type II diabetes mellitus [facility])     Past Surgical History:  Procedure Laterality Date   ANTERIOR CERVICAL DECOMP/DISCECTOMY FUSION  08/25/2012   Procedure: ANTERIOR CERVICAL DECOMPRESSION/DISCECTOMY FUSION 2 LEVELS;  Surgeon: Hosie Spangle, MD;  Location: [facility];  Service: Neurosurgery;  Laterality: N/A;  Cervical five-six Cervical six-seven anterior cervical decompression with fusion and plating and bonegraft   BACK SURGERY     CARDIAC CATHETERIZATION  04/2014;  07/19/2014   CORONARY ARTERY BYPASS GRAFT N/A 04/26/2014   Procedure: CORONARY ARTERY BYPASS GRAFTING (CABG), on pump, times four, using left internal mammary artery, right greater saphenous vein harvested endoscopically.;  Surgeon: Ivin Poot, MD;  Location: [facility];  Service: Open Heart Surgery;  Laterality: N/A;  LIMA to LAD, SVG to DIAGONAL, SVG to OM1, SVG to PDA) with EVH of the RIGHT THIGH and LOWER EXTREMITY SAPHENOUS VEIN   Cytoscopy prostatic stone o/w nml  06/21/08    Dr. Jacqlyn Larsen   ETT myoview  09/13/09   Low risk EF 49%   High intense focused ultrasound  07/20/06   By Dr. Jacqlyn Larsen   INTRAOPERATIVE TRANSESOPHAGEAL ECHOCARDIOGRAM N/A 04/26/2014   Procedure: INTRAOPERATIVE TRANSESOPHAGEAL ECHOCARDIOGRAM;  Surgeon: Ivin Poot, MD;  Location: [facility];  Service: Open Heart Surgery;  Laterality: N/A;   PERCUTANEOUS CORONARY ROTOBLATOR INTERVENTION (PCI-R) N/A 07/22/2014   Procedure: PERCUTANEOUS CORONARY ROTOBLATOR INTERVENTION (PCI-R);  Surgeon: Peter M Martinique, MD;  Location: [facility] CATH LAB;  Service: Cardiovascular;  Laterality: N/A;   PERIPHERAL VASCULAR CATHETERIZATION Left 07/26/2015   Procedure: Lower Extremity Angiography;  Surgeon: Katha Cabal, MD;  Location: [facility] CV LAB;  Service: Cardiovascular;  Laterality: Left;   PERIPHERAL VASCULAR CATHETERIZATION  07/26/2015   Procedure: Lower Extremity Intervention;  Surgeon: Katha Cabal, MD;  Location: [facility] CV LAB;  Service: Cardiovascular;;   PROSTATE BIOPSY  04/03/06 & 02/25/07     Current Outpatient Medications  Medication Sig Dispense Refill   aspirin EC 81 MG tablet Take 1 tablet (81 mg total) by mouth daily. 90 tablet 3   clopidogrel (PLAVIX) 75 MG tablet TAKE 1 TABLET BY MOUTH ONCE DAILY 90 tablet 1   Dulaglutide (TRULICITY) 1.5 LE/7.5TZ SOPN Inject 1.5 mg into the skin once a week. 2 mL 11   Evolocumab (REPATHA SURECLICK) 001 MG/ML SOAJ INJECT 1 PEN INTO THE SKIN EVERY 14 DAYS 2 mL 2   ezetimibe (ZETIA) 10 MG tablet Take 1 tablet (10 mg total) by mouth daily. PLEASE SCHEDULE OFFICE VISIT FOR FURTHER REFILLS. THANK YOU! 30 tablet 0   famotidine (PEPCID) 20 MG tablet Take 1 tablet (20 mg total) by mouth daily.     furosemide (LASIX) 20 MG tablet TAKE 1 TABLET BY MOUTH ONCE A DAY 90 tablet 1   glucose blood (ONETOUCH ULTRA) test strip CHECK BLOOD SUGAR TWICE A DAY - DX E11.59 200 strip 3   Injection Device (CORNWALL METAL PIPETTING) MISC Frequency:PHARMDIR   Dosage:0.0     Instructions:   Note:diagnosis 250.02 Dose: NA     insulin aspart (NOVOLOG FLEXPEN) 100 UNIT/ML FlexPen Inject 16-24 Units into the skin daily before supper. 30 mL 3   insulin degludec (TRESIBA FLEXTOUCH) 200 UNIT/ML FlexTouch Pen Inject 86 Units into the skin daily. (Patient taking differently: Inject 100 Units into the skin daily.) 18 mL 3   Insulin Pen Needle 32G X 4 MM MISC Use 1x a day 100 each 3   INVOKANA 300 MG TABS tablet TAKE 1 TABLET BY MOUTH ONCE A DAY WITH BREAKFAST 90 tablet 3   losartan (COZAAR) 25 MG tablet Take 1 tablet (25 mg total) by mouth daily. 90 tablet 1   meclizine (ANTIVERT) 25 MG tablet Take 0.5-1 tablets (12.5-25 mg total) by mouth 2 (two) times daily as needed for dizziness or nausea. 30 tablet 0   MYRBETRIQ 50 MG TB24 tablet TAKE 1 TABLET BY MOUTH ONCE A DAY 30 tablet 12   nitroGLYCERIN (NITROSTAT) 0.4 MG  SL tablet Place 1 tablet (0.4 mg total) under the tongue as needed. 25 tablet 2   tamsulosin (FLOMAX) 0.4 MG CAPS capsule TAKE 1 CAPSULE BY MOUTH ONCE DAILY AFTERSUPPER 30 capsule 11   leuprolide (LUPRON) 30 MG injection Inject 45 mg into the muscle every 6 (six) months. (Patient not taking: Reported on 08/17/2021)     No current facility-administered medications for this visit.    Allergies:   Coreg [carvedilol], Metformin and related, Protonix [pantoprazole sodium], Rosuvastatin calcium, and Statins    Social History:  The patient  reports that he quit smoking about 7 years ago. His smoking use included cigarettes. He has a 36.00 pack-year smoking history. He has never used smokeless tobacco. He reports current alcohol use. He reports that he does not use drugs.   Family History:  The patient's family history includes Coronary artery disease in his father and mother; Diabetes in his brother, father, and sister; Diabetes (age of onset: 29) in his mother; Heart attack in his brother, father, maternal grandfather, and mother; Heart disease in his brother, father, and mother;  Hypertension in his father and mother; Prostate cancer (age of onset: 49) in his paternal grandfather.    ROS:  Please see the history of present illness.   Otherwise, review of systems are positive for none.   All other systems are reviewed and negative.    PHYSICAL EXAM: VS:  BP 136/68 (BP Location: Left Arm, Patient Position: Sitting, Cuff Size: Normal)    Pulse 69    Ht 5\' 9"  (1.753 m)    Wt 248 lb 2 oz (112.5 kg)    SpO2 98%    BMI 36.64 kg/m  , BMI Body mass index is 36.64 kg/m. GEN: Well nourished, well developed, in no acute distress  HEENT: normal  Neck: no JVD or masses.  Right carotid bruit. Cardiac: RRR; no rubs, or gallops, trace edema .  1 /6 systolic murmur in the aortic area Respiratory:  clear to auscultation bilaterally, normal work of breathing GI: soft, nontender, nondistended, + BS MS: no deformity or atrophy  Skin: warm and dry, no rash Neuro:  Strength and sensation are intact Psych: euthymic mood, full affect   EKG:  EKG is ordered today. The ekg ordered today demonstrates normal sinus rhythm with old septal and inferior infarct.  No significant ST changes.   Recent Labs: 08/19/2020: BNP 38.0 04/25/2021: Pro B Natriuretic peptide (BNP) 83.0 06/26/2021: ALT 21; BUN 23; Creatinine, Ser 0.92; Hemoglobin 15.3; Platelets 169; Potassium 4.1; Sodium 134    Lipid Panel    Component Value Date/Time   CHOL 166 10/09/2019 0952   CHOL 97 (L) 09/24/2016 0855   TRIG 138 10/09/2019 0952   HDL 42 10/09/2019 0952   HDL 38 (L) 09/24/2016 0855   CHOLHDL 4.0 10/09/2019 0952   VLDL 28 10/09/2019 0952   LDLCALC 96 10/09/2019 0952   LDLCALC 37 09/24/2016 0855   LDLDIRECT 157.1 09/07/2013 0910      Wt Readings from Last 3 Encounters:  08/17/21 248 lb 2 oz (112.5 kg)  06/26/21 246 lb (111.6 kg)  06/22/21 244 lb 6.4 oz (110.9 kg)        ASSESSMENT AND PLAN:  1.  Coronary artery disease involving graft disease with other forms of angina:  Continue medical therapy.   Continue dual antiplatelet therapy indefinitely given left main stent into the LAD.  He had a recent episode of shortness of breath and burning sensation in the chest but did  not last long.  This was an isolated incident.  I asked him to monitor symptoms and let me know if symptoms become consistent or more frequent.  2. Peripheral arterial disease: He continues to describe bilateral calf discomfort with walking.  However, most recent ABI was normal.  In addition, he reports that he had no significant improvement in symptoms when he had previous SFA angioplasty in 2016.  Suspect that he might have another etiology.  3. Hyperlipidemia: He is intolerant to statins.  Continue treatment with Repatha and Zetia.  The patient will require a lipid profile this year.  4. Essential hypertension: Blood pressure is controlled on losartan.  5.  Prostate cancer: In remission on long-term Lupron therapy.  6.  Right carotid bruit: I requested carotid Doppler.    Disposition:   FU with me in 6 months  Signed,  [name], MD  08/17/2021 9:03 AM    [place]

## 2021-08-17 NOTE — Patient Instructions (Signed)
Medication Instructions:  Your physician recommends that you continue on your current medications as directed. Please refer to the Current Medication list given to you today.  *If you need a refill on your cardiac medications before your next appointment, please call your pharmacy*   Lab Work: None ordered If you have labs (blood work) drawn today and your tests are completely normal, you will receive your results only by: [place] (if you have MyChart) OR A paper copy in the mail If you have any lab test that is abnormal or we need to change your treatment, we will call you to review the results.   Testing/Procedures: Your physician has requested that you have a carotid duplex. This test is an ultrasound of the carotid arteries in your neck. It looks at blood flow through these arteries that supply the brain with blood. Allow one hour for this exam. There are no restrictions or special instructions.    Follow-Up: At [facility], you and your health needs are our priority.  As part of our continuing mission to provide you with exceptional heart care, we have created designated Provider Care Teams.  These Care Teams include your primary Cardiologist (physician) and Advanced Practice Providers (APPs -  Physician Assistants and Nurse Practitioners) who all work together to provide you with the care you need, when you need it.  We recommend signing up for the patient portal called "MyChart".  Sign up information is provided on this After Visit Summary.  MyChart is used to connect with patients for Virtual Visits (Telemedicine).  Patients are able to view lab/test results, encounter notes, upcoming appointments, etc.  Non-urgent messages can be sent to your provider as well.   To learn more about what you can do with MyChart, go to [email or web address].    Your next appointment:   6 month(s)  The format for your next appointment:   In Person  Provider:   You may see [name], MD or one of the following Advanced Practice Providers on your designated Care Team:   Murray Hodgkins, NP Christell Faith, PA-C Cadence Kathlen Mody, PA-C{    Other Instructions N/A

## 2021-08-30 ENCOUNTER — Other Ambulatory Visit: Payer: Self-pay

## 2021-08-30 ENCOUNTER — Ambulatory Visit (INDEPENDENT_AMBULATORY_CARE_PROVIDER_SITE_OTHER): Payer: Medicare Other | Admitting: Dermatology

## 2021-08-30 DIAGNOSIS — L578 Other skin changes due to chronic exposure to nonionizing radiation: Secondary | ICD-10-CM

## 2021-08-30 DIAGNOSIS — L570 Actinic keratosis: Secondary | ICD-10-CM

## 2021-08-30 DIAGNOSIS — L814 Other melanin hyperpigmentation: Secondary | ICD-10-CM | POA: Diagnosis not present

## 2021-08-30 DIAGNOSIS — Z1283 Encounter for screening for malignant neoplasm of skin: Secondary | ICD-10-CM | POA: Diagnosis not present

## 2021-08-30 DIAGNOSIS — I872 Venous insufficiency (chronic) (peripheral): Secondary | ICD-10-CM

## 2021-08-30 DIAGNOSIS — R202 Paresthesia of skin: Secondary | ICD-10-CM

## 2021-08-30 DIAGNOSIS — L821 Other seborrheic keratosis: Secondary | ICD-10-CM | POA: Diagnosis not present

## 2021-08-30 DIAGNOSIS — D229 Melanocytic nevi, unspecified: Secondary | ICD-10-CM

## 2021-08-30 DIAGNOSIS — I2510 Atherosclerotic heart disease of native coronary artery without angina pectoris: Secondary | ICD-10-CM

## 2021-08-30 DIAGNOSIS — D1800 Hemangioma unspecified site: Secondary | ICD-10-CM | POA: Diagnosis not present

## 2021-08-30 NOTE — Progress Notes (Signed)
New Patient Visit  Subjective  Phillip Clements is a 71 y.o. male who presents for the following: Annual Exam. The patient presents for Total-Body Skin Exam (TBSE) for skin cancer screening and mole check.  The patient has spots, moles and lesions to be evaluated, some may be new or changing and the patient has concerns that these could be cancer.   Wife with pt   The following portions of the chart were reviewed this encounter and updated as appropriate:   Tobacco   Allergies   Meds   Problems   Med Hx   Surg Hx   Fam Hx      Review of Systems:  No other skin or systemic complaints except as noted in HPI or Assessment and Plan.  Objective  Well appearing patient in no apparent distress; mood and affect are within normal limits.  A full examination was performed including scalp, head, eyes, ears, nose, lips, neck, chest, axillae, abdomen, back, buttocks, bilateral upper extremities, bilateral lower extremities, hands, feet, fingers, toes, fingernails, and toenails. All findings within normal limits unless otherwise noted below.  left cheek Erythematous thin papules/macules with gritty scale.   left upper back Normal skin       lower legs Erythematous, scaly patches involving the ankle and distal lower leg with associated lower leg edema.    Assessment & Plan  AK (actinic keratosis) left cheek  Actinic keratoses are precancerous spots that appear secondary to cumulative UV radiation exposure/sun exposure over time. They are chronic with expected duration over 1 year. A portion of actinic keratoses will progress to squamous cell carcinoma of the skin. It is not possible to reliably predict which spots will progress to skin cancer and so treatment is recommended to prevent development of skin cancer.  Recommend daily broad spectrum sunscreen SPF 30+ to sun-exposed areas, reapply every 2 hours as needed.  Recommend staying in the shade or wearing long sleeves, sun glasses  (UVA+UVB protection) and wide brim hats (4-inch brim around the entire circumference of the hat). Call for new or changing lesions.   Destruction of lesion - left cheek Complexity: simple   Destruction method: cryotherapy   Informed consent: discussed and consent obtained   Timeout:  patient name, date of birth, surgical site, and procedure verified Lesion destroyed using liquid nitrogen: Yes   Region frozen until ice ball extended beyond lesion: Yes   Outcome: patient tolerated procedure well with no complications   Post-procedure details: wound care instructions given    Notalgia paresthetica left upper back  Notalgia paresthetica is a chronic condition affecting the skin of the back in which a pinched nerve along the spine causes itching or changes in sensation in an area of skin. This is usually accompanied by chronic rubbing or scratching. There is no cure, but there are some treatments which may help control the itch.   Over the counter (non-prescription) treatments for notalgia paresthetica include numbing creams like pramoxine or lidocaine which temporarily reduce itch or Capsaicin-containing creams which cause a burning sensation but which sometimes over time will reset the nerves to stop producing itch.   If you choose to use Capsaicin cream, it is recommended to use it 5 times daily for 1 week followed by 3 times daily for 3-6 weeks. You may have to continue using it long-term. For severe cases, there are some prescription cream or pill options which may help.   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  Recommend daily graduated compression hose/stockings- easiest to put on first thing in morning, remove at bedtime.  Elevate legs as much as possible. Avoid salt/sodium rich foods.   Skin cancer  screening  Lentigines - Scattered tan macules - Due to sun exposure - Benign-appearing, observe - Recommend daily broad spectrum sunscreen SPF 30+ to sun-exposed areas, reapply every 2 hours as needed. - Call for any changes  Seborrheic Keratoses - Stuck-on, waxy, tan-brown papules and/or plaques  - Benign-appearing - Discussed benign etiology and prognosis. - Observe - Call for any changes  Melanocytic Nevi - Tan-brown and/or pink-flesh-colored symmetric macules and papules - Benign appearing on exam today - Observation - Call clinic for new or changing moles - Recommend daily use of broad spectrum spf 30+ sunscreen to sun-exposed areas.   Hemangiomas - Red papules - Discussed benign nature - Observe - Call for any changes  Actinic Damage - Chronic condition, secondary to cumulative UV/sun exposure - diffuse scaly erythematous macules with underlying dyspigmentation - Recommend daily broad spectrum sunscreen SPF 30+ to sun-exposed areas, reapply every 2 hours as needed.  - Staying in the shade or wearing long sleeves, sun glasses (UVA+UVB protection) and wide brim hats (4-inch brim around the entire circumference of the hat) are also recommended for sun protection.  - Call for new or changing lesions.  Skin cancer screening performed today.   Return in about 3 months (around 11/28/2021) for Aks .  IMarye Round, CMA, am acting as scribe for Sarina Ser, MD .  Documentation: I have reviewed the above documentation for accuracy and completeness, and I agree with the above.  Sarina Ser, MD

## 2021-08-30 NOTE — Patient Instructions (Addendum)
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 This is usually accompanied by chronic rubbing or scratching. There is no cure, but there are some treatments which may help control the itch.   Over the counter (non-prescription) treatments for notalgia paresthetica include numbing creams like pramoxine or lidocaine which temporarily reduce itch or Capsaicin-containing creams which cause a burning sensation but which sometimes over time will reset the nerves to stop producing itch.   If you choose to use Capsaicin cream, it is recommended to use it 5 times daily for 1 week followed by 3 times daily for 3-6 weeks. You may have to continue using it long-term. For severe cases, there are some prescription cream or pill options which may help.  Over the counter Voltaren may help      Cryotherapy Aftercare Left cheek  Wash gently with soap and water everyday.   Apply Vaseline and Band-Aid daily until healed.    If You Need Anything After Your Visit  If you have any questions or concerns for your doctor, please call our main line at [phone] and press option 4 to reach your doctor's medical assistant. If no one answers, please leave a voicemail as directed and we will return your call as soon as possible. Messages left after 4 pm will be answered the following business day.   You may also send [place] a message via [place]. We typically respond to MyChart messages within 1-2 business days.  For prescription refills, please ask your pharmacy to contact our office. Our fax number is [phone].  If you have an urgent issue when the clinic is closed that cannot wait until the next business day, you can page your doctor at the number below.    Please note that while we do our best to be available for urgent issues outside of office hours, we are not available 24/7.   If  you have an urgent issue and are unable to reach [place], you may choose to seek medical care at your doctor's office, retail clinic, urgent care center, or emergency room.  If you have a medical emergency, please immediately call 911 or go to the emergency department.  Pager Numbers  - Dr. Nehemiah Massed: [phone]  - Dr. Laurence Ferrari: [phone]  - Dr. Nicole Kindred: [phone]  In the event of inclement weather, please call our main line at [phone] for an update on the status of any delays or closures.  Dermatology Medication Tips: Please keep the boxes that topical medications come in in order to help keep track of the instructions about where and how to use these. Pharmacies typically print the medication instructions only on the boxes and not directly on the medication tubes.   If your medication is too expensive, please contact our office at [phone] option 4 or send [place] a message through [place].   We are unable to tell what your co-pay for medications will be in advance as this is different depending on your insurance coverage. However, we may be able to find a substitute medication at lower cost or fill out paperwork to get insurance to cover a needed medication.   If a prior authorization is required to get your medication covered by your insurance company, please allow [place] 1-2 business days to complete this process.  Drug prices often vary depending on where the prescription is filled and some pharmacies may  offer cheaper prices.  The website www.goodrx.com contains coupons for medications through different pharmacies. The prices here do not account for what the cost may be with help from insurance (it may be cheaper with your insurance), but the website can give you the price if you did not use any insurance.  - You can print the associated coupon and take it with your prescription to the pharmacy.  - You may also stop by our office during regular business hours and pick up a GoodRx  coupon card.  - If you need your prescription sent electronically to a different pharmacy, notify our office through [facility] or by phone at [phone] option 4.     Si Usted Necesita Algo Despus de Su Visita  Tambin puede enviarnos un mensaje a travs de Pharmacist, community. Por lo general respondemos a los mensajes de MyChart en el transcurso de 1 a 2 das hbiles.  Para renovar recetas, por favor pida a su farmacia que se ponga en contacto con nuestra oficina. Harland Dingwall de fax es [place] [phone].  Si tiene un asunto urgente cuando la clnica est cerrada y que no puede esperar hasta el siguiente da hbil, puede llamar/localizar a su doctor(a) al nmero que aparece a continuacin.   Por favor, tenga en cuenta que aunque hacemos todo lo posible para estar disponibles para asuntos urgentes fuera del horario de [place], no estamos disponibles las 24 horas del da, los 7 das de la [place].   Si tiene un problema urgente y no puede comunicarse con nosotros, puede optar por buscar atencin mdica  en el consultorio de su doctor(a), en una clnica privada, en un centro de atencin urgente o en una sala de emergencias.  Si tiene Engineering geologist, por favor llame inmediatamente al 911 o vaya a la sala de emergencias.  Nmeros de bper  - Dr. Nehemiah Massed: [phone]  - Dra. Moye: [phone]  - Dra. Nicole Kindred: [phone]  En caso de inclemencias del [place], por favor llame a Johnsie Kindred principal al [phone] para una actualizacin sobre el [place] de cualquier retraso o cierre.  Consejos para la medicacin en dermatologa: Por favor, guarde las cajas en las que vienen los medicamentos de uso tpico para ayudarle a seguir las instrucciones sobre dnde y cmo usarlos. Las farmacias generalmente imprimen las instrucciones del medicamento slo en las cajas y no directamente en los tubos del [place].   Si su medicamento es muy caro, por favor, pngase en contacto con  Zigmund Daniel llamando al [phone] y presione la opcin 4 o envenos un mensaje a travs de Pharmacist, community.   No podemos decirle cul ser su copago por los medicamentos por adelantado ya que esto es diferente dependiendo de la cobertura de su seguro. Sin embargo, es posible que podamos encontrar un medicamento sustituto a Electrical engineer un formulario para que el seguro cubra el medicamento que se considera necesario.   Si se requiere una autorizacin previa para que su compaa de seguros [place] su medicamento, por favor permtanos de 1 a 2 das hbiles para completar este proceso.  Los precios de los medicamentos varan con frecuencia dependiendo del Environmental consultant de dnde se surte la receta y alguna farmacias pueden ofrecer precios ms baratos.  El sitio web www.goodrx.com tiene cupones para medicamentos de Airline pilot. Los precios aqu no tienen en cuenta lo que podra costar con la ayuda del seguro (puede ser ms barato con su seguro), pero el sitio web puede darle el precio si no Copy  ningn seguro.  - Puede imprimir el cupn correspondiente y llevarlo con su receta a la farmacia.  - Tambin puede pasar por nuestra oficina durante el horario de atencin regular y Charity fundraiser una tarjeta de cupones de GoodRx.  - Si necesita que su receta se enve electrnicamente a una farmacia diferente, informe a nuestra oficina a travs de MyChart de [place] o por telfono llamando al [phone] y presione la opcin 4.

## 2021-08-31 ENCOUNTER — Encounter: Payer: Self-pay | Admitting: Dermatology

## 2021-09-14 ENCOUNTER — Ambulatory Visit (INDEPENDENT_AMBULATORY_CARE_PROVIDER_SITE_OTHER): Payer: Medicare Other

## 2021-09-14 ENCOUNTER — Other Ambulatory Visit: Payer: Self-pay

## 2021-09-14 DIAGNOSIS — R0989 Other specified symptoms and signs involving the circulatory and respiratory systems: Secondary | ICD-10-CM

## 2021-09-18 ENCOUNTER — Encounter: Payer: Self-pay | Admitting: Emergency Medicine

## 2021-09-18 ENCOUNTER — Ambulatory Visit
Admission: EM | Admit: 2021-09-18 | Discharge: 2021-09-18 | Disposition: A | Discharge status: Home / Self Care | Payer: Medicare Other | Attending: Emergency Medicine | Admitting: Emergency Medicine

## 2021-09-18 DIAGNOSIS — J209 Acute bronchitis, unspecified: Secondary | ICD-10-CM | POA: Insufficient documentation

## 2021-09-18 DIAGNOSIS — J0100 Acute maxillary sinusitis, unspecified: Secondary | ICD-10-CM | POA: Insufficient documentation

## 2021-09-18 MED ORDER — AMOXICILLIN-POT CLAVULANATE 875-125 MG PO TABS: 1.0000 | ORAL_TABLET | Freq: Two times a day (BID) | ORAL | 0 refills | Status: AC

## 2021-09-18 NOTE — ED Triage Notes (Signed)
Pt presents with cough, congestion, x 6 days

## 2021-09-18 NOTE — Discharge Instructions (Addendum)
Take the Augmentin as directed.  Follow up with your primary care provider if your symptoms are not improving.    

## 2021-09-18 NOTE — ED Provider Notes (Signed)
Phillip Clements    CSN: [id number] Arrival date & time: 09/18/21  1750      History   Chief Complaint Chief Complaint  Patient presents with   Cough   Nasal Congestion    HPI Phillip Clements is a 71 y.o. male.  Patient presents with 1 week history of congestion and cough.  Treatment at home with DayQuil and NyQuil.  He denies fever, chills, rash, shortness of breath, vomiting, diarrhea, or other symptoms.  His medical history includes hypertension, diabetes, PVD, CVA, prostate cancer.  The history is provided by the patient and medical records.   Past Medical History:  Diagnosis Date   Cataract    bilateral eye in 2010   Coronary artery disease    a. CABG 04/2014: (LIMA->LAD, VG->DIAG, VG->OM2, VG->PDA)  b. 07/2014 early graft failure (VG->OM2 60, LIMA->LAD atretic, VG->PDA & VG->D1 100). s/p successful rotational atherectomy & DES of the LM & pLAD (3.0x24 Promus DES) & mLAD (2.25x24 Promus DES).   CVA (cerebral infarction)    Family history of prostate cancer    Hx of adenomatous colonic polyps 06/17/2017   Hyperlipidemia    a. Statin intolerant.   Hypertension 2000   Moderate mitral regurgitation    a. 02/2015 Echo: EF 55-60%, no rwma, mod MR, mod BAE, mildly dil RV w/ low nl RV fxn.   OSA on CPAP    Peripheral vascular disease ([place])    a. left SFA angioplasty in December 2016   Prostate cancer [facility])    Sleep apnea    wears CPAP nightly   Type II diabetes mellitus [facility])     Patient Active Problem List   Diagnosis Date Noted   Cough 04/26/2021   Loose stools 11/13/2020   Genetic testing 03/04/2020   Family history of prostate cancer    Healthcare maintenance 12/07/2018   Advance care planning 12/07/2018   Hx of adenomatous colonic polyps 06/17/2017   Low back pain 04/05/2017   Fatigue 07/29/2016   Poorly controlled type 2 diabetes mellitus with circulatory disorder ([place]) 03/18/2015   CVA (cerebral vascular accident) ([place]) 07/23/2014   CVA (cerebral  infarction)    OSA on CPAP    Hypertension    Coronary artery disease involving coronary bypass graft of native heart with unstable angina pectoris ([place])    Acute coronary syndrome ([place]) 07/19/2014   Postoperative atrial fibrillation ([place]) 05/23/2014   Coronary artery disease    Hyperlipidemia    Angina, class III ([place]) 04/13/2014   Syncope 04/13/2014   Exertional chest pain 03/29/2014   SK (seborrheic keratosis) 12/30/2013   Caregiver burden 12/30/2013   Bladder outflow obstruction 06/11/2013   BPV (benign positional vertigo), left 04/28/2013   Genuine stress incontinence, male 05/20/2012   Disorder of bladder function 05/13/2012   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   Incomplete bladder emptying 05/13/2012   Malignant neoplasm of prostate ([place]) 05/13/2012   Malignant neoplasm metastatic to bone ([place]) 05/13/2012   Infection of urinary tract 05/13/2012   Burning or prickling sensation 12/03/2011   Elevated blood pressure reading without diagnosis of hypertension 07/30/2011   Cerebrovascular accident (CVA) ([place]) 07/30/2011   Arthritis, degenerative 07/30/2011   Pure hypercholesterolemia 07/30/2011   Presbyopia 07/30/2011   Peripheral vascular disease ([place]) 10/03/2009   DEGENERATIVE JOINT DISEASE 10/03/2009   Artery disease, cerebral 07/18/2009   Obesity 05/12/2009   Benign hypertension 06/29/2008   History of tobacco use 06/29/2008   NICOTINE ADDICTION 09/16/2007   Prostate cancer (  [place]) 09/16/2007    Past Surgical History:  Procedure Laterality Date   ANTERIOR CERVICAL DECOMP/DISCECTOMY FUSION  08/25/2012   Procedure: ANTERIOR CERVICAL DECOMPRESSION/DISCECTOMY FUSION 2 LEVELS;  Surgeon: Hosie Spangle, MD;  Location: [facility];  Service: Neurosurgery;  Laterality: N/A;  Cervical five-six Cervical six-seven anterior cervical decompression with fusion and plating and bonegraft   BACK SURGERY     CARDIAC CATHETERIZATION  04/2014; 07/19/2014   CORONARY  ARTERY BYPASS GRAFT N/A 04/26/2014   Procedure: CORONARY ARTERY BYPASS GRAFTING (CABG), on pump, times four, using left internal mammary artery, right greater saphenous vein harvested endoscopically.;  Surgeon: Ivin Poot, MD;  Location: [facility];  Service: Open Heart Surgery;  Laterality: N/A;  LIMA to LAD, SVG to DIAGONAL, SVG to OM1, SVG to PDA) with EVH of the RIGHT THIGH and LOWER EXTREMITY SAPHENOUS VEIN   Cytoscopy prostatic stone o/w nml  06/21/08   Dr. Jacqlyn Larsen   ETT myoview  09/13/09   Low risk EF 49%   High intense focused ultrasound  07/20/06   By Dr. Jacqlyn Larsen   INTRAOPERATIVE TRANSESOPHAGEAL ECHOCARDIOGRAM N/A 04/26/2014   Procedure: INTRAOPERATIVE TRANSESOPHAGEAL ECHOCARDIOGRAM;  Surgeon: Ivin Poot, MD;  Location: [facility];  Service: Open Heart Surgery;  Laterality: N/A;   PERCUTANEOUS CORONARY ROTOBLATOR INTERVENTION (PCI-R) N/A 07/22/2014   Procedure: PERCUTANEOUS CORONARY ROTOBLATOR INTERVENTION (PCI-R);  Surgeon: Peter M Martinique, MD;  Location: [facility] C[facility];  Service: Cardiovascular;  Laterality: N/A;   PERIPHERAL VASCULAR CATHETERIZATION Left 07/26/2015   Procedure: Lower Extremity Angiography;  Surgeon: Katha Cabal, MD;  Location: [facility] CV LAB;  Service: Cardiovascular;  Laterality: Left;   PERIPHERAL VASCULAR CATHETERIZATION  07/26/2015   Procedure: Lower Extremity Intervention;  Surgeon: Katha Cabal, MD;  Location: [facility] CV LAB;  Service: Cardiovascular;;   PROSTATE BIOPSY  04/03/06 & 02/25/07       Home Medications    Prior to Admission medications   Medication Sig Start Date End Date Taking? Authorizing Provider  amoxicillin-clavulanate (AUGMENTIN) 875-125 MG tablet Take 1 tablet by mouth every 12 (twelve) hours for 10 days. 09/18/21 09/28/21 Yes Sharion Balloon, NP  aspirin EC 81 MG tablet Take 1 tablet (81 mg total) by mouth daily. 11/01/14   Wellington Hampshire, MD  carvedilol (COREG) 3.125 MG tablet Take 3.125 mg by mouth 2 (two) times daily. 07/28/21    [provider]  clopidogrel (PLAVIX) 75 MG tablet TAKE 1 TABLET BY MOUTH ONCE DAILY 04/03/21   Wellington Hampshire, MD  Dulaglutide (TRULICITY) 1.5 BT/5.1VO SOPN Inject 1.5 mg into the skin once a week. 02/28/21   Philemon Kingdom, MD  Evolocumab (REPATHA SURECLICK) 160 MG/ML SOAJ INJECT 1 PEN INTO THE SKIN EVERY 14 DAYS 06/08/21   Wellington Hampshire, MD  ezetimibe (ZETIA) 10 MG tablet Take 1 tablet (10 mg total) by mouth daily. PLEASE SCHEDULE OFFICE VISIT FOR FURTHER REFILLS. THANK YOU! 07/28/21   Wellington Hampshire, MD  famotidine (PEPCID) 20 MG tablet Take 1 tablet (20 mg total) by mouth daily. 04/25/21   Tonia Ghent, MD  furosemide (LASIX) 20 MG tablet TAKE 1 TABLET BY MOUTH ONCE A DAY 05/01/21   Wellington Hampshire, MD  glucose blood (ONETOUCH ULTRA) test strip CHECK BLOOD SUGAR TWICE A DAY - DX E11.59 06/26/19   Philemon Kingdom, MD  Injection Device (CORNWALL METAL PIPETTING) MISC Frequency:PHARMDIR   Dosage:0.0     Instructions:  Note:diagnosis 250.02 Dose: NA 09/28/08   [provider]  insulin aspart (NOVOLOG FLEXPEN) 100 UNIT/ML FlexPen Inject 16-24 Units into the skin daily before supper. 02/28/21   Philemon Kingdom, MD  insulin degludec (TRESIBA FLEXTOUCH) 200 UNIT/ML FlexTouch Pen Inject 86 Units into the skin daily. Patient taking differently: Inject 100 Units into the skin daily. 01/19/21   Philemon Kingdom, MD  Insulin Pen Needle 32G X 4 MM MISC Use 1x a day 06/26/19   Philemon Kingdom, MD  INVOKANA 300 MG TABS tablet TAKE 1 TABLET BY MOUTH ONCE A DAY WITH BREAKFAST 04/03/21   Philemon Kingdom, MD  leuprolide (LUPRON) 30 MG injection Inject 45 mg into the muscle every 6 (six) months. Patient not taking: Reported on 08/17/2021    [provider]  losartan (COZAAR) 25 MG tablet Take 1 tablet (25 mg total) by mouth daily. 02/14/21   Wellington Hampshire, MD  meclizine (ANTIVERT) 25 MG tablet Take 0.5-1 tablets (12.5-25 mg total) by mouth 2 (two) times daily as needed  for dizziness or nausea. 01/26/19   Ria Bush, MD  MYRBETRIQ 50 MG TB24 tablet TAKE 1 TABLET BY MOUTH ONCE A DAY 06/20/21   Stoioff, Ronda Fairly, MD  nitroGLYCERIN (NITROSTAT) 0.4 MG SL tablet Place 1 tablet (0.4 mg total) under the tongue as needed. 06/03/20   Marrianne Mood D, PA-C  tamsulosin (FLOMAX) 0.4 MG CAPS capsule TAKE 1 CAPSULE BY MOUTH ONCE DAILY AFTERSUPPER 06/05/21   Noreene Filbert, MD    Family History Family History  Problem Relation Age of Onset   Diabetes Mother 85       DM   Coronary artery disease Mother    Hypertension Mother    Heart attack Mother        CABG with MI in 2005   Heart disease Mother        CV   Hypertension Father    Heart attack Father        CABG with Mi around 1997   Coronary artery disease Father    Heart disease Father        CV   Diabetes Father    Heart attack Brother        MI/PTCA   Heart disease Brother        CV   Diabetes Brother    Heart attack Maternal Grandfather        MI   Prostate cancer Paternal Grandfather 85       likely metastatic   Diabetes Sister        DM   Breast cancer Neg Hx        Breast/ovarian/uterine cancer   Colon cancer Neg Hx    Depression Neg Hx    Alcohol abuse Neg Hx        ETOH/drug abuse   Stroke Neg Hx     Social History Social History   Tobacco Use   Smoking status: Former    Packs/day: 1.00    Years: 36.00    Pack years: 36.00    Types: Cigarettes    Quit date: 03/01/2014    Years since quitting: 7.5   Smokeless tobacco: Never  Vaping Use   Vaping Use: Never used  Substance Use Topics   Alcohol use: Yes    Comment: 07/19/2014 "might have a drink a couple times/yr"   Drug use: No     Allergies   Coreg [carvedilol], Metformin and related, Protonix [pantoprazole sodium], Rosuvastatin calcium, and Statins   Review of Systems Review of Systems  Constitutional:  Negative for  chills and fever.  HENT:  Positive for congestion. Negative for ear pain and sore throat.    Eyes:  Negative for visual disturbance.  Respiratory:  Positive for cough. Negative for shortness of breath.   Cardiovascular:  Negative for chest pain and palpitations.  Gastrointestinal:  Negative for diarrhea and vomiting.  Skin:  Negative for color change and rash.  All other systems reviewed and are negative.   Physical Exam Triage Vital Signs ED Triage Vitals  Enc Vitals Group     BP 09/18/21 1915 138/68     Pulse Rate 09/18/21 1915 79     Resp 09/18/21 1915 18     Temp 09/18/21 1915 98.1 F (36.7 C)     Temp src --      SpO2 09/18/21 1915 95 %     Weight --      Height --      Head Circumference --      Peak Flow --      Pain Score 09/18/21 1918 0     Pain Loc --      Pain Edu? --      Excl. in [place]? --    No data found.  Updated Vital Signs BP 138/68    Pulse 79    Temp 98.1 F (36.7 C)    Resp 18    SpO2 95%   Visual Acuity Right Eye Distance:   Left Eye Distance:   Bilateral Distance:    Right Eye Near:   Left Eye Near:    Bilateral Near:     Physical Exam Vitals and nursing note reviewed.  Constitutional:      General: He is not in acute distress.    Appearance: He is well-developed.  HENT:     Right Ear: Tympanic membrane normal.     Left Ear: Tympanic membrane normal.     Nose: Congestion present.     Mouth/Throat:     Mouth: Mucous membranes are moist.     Pharynx: Oropharynx is clear.  Cardiovascular:     Rate and Rhythm: Normal rate and regular rhythm.     Heart sounds: Normal heart sounds.  Pulmonary:     Effort: Pulmonary effort is normal. No respiratory distress.     Breath sounds: Normal breath sounds.  Musculoskeletal:     Cervical back: Neck supple.  Skin:    General: Skin is warm and dry.  Neurological:     Mental Status: He is alert.  Psychiatric:        Mood and Affect: Mood normal.        Behavior: Behavior normal.     UC Treatments / Results  Labs (all labs ordered are listed, but only abnormal results are  displayed) Labs Reviewed - No data to display  EKG   Radiology No results found.  Procedures Procedures (including critical care time)  Medications Ordered in UC Medications - No data to display  Initial Impression / Assessment and Plan / UC Course  I have reviewed the triage vital signs and the nursing notes.  Pertinent labs & imaging results that were available during my care of the patient were reviewed by me and considered in my medical decision making (see chart for details).   Acute sinusitis, acute bronchitis.  Patient has been symptomatic for a week and is not improving with OTC treatment.  Treating today with Augmentin.  Discussed symptomatic care.  Instructed patient to follow up with his PCP if his symptoms  are not improving.  He agrees to plan of care.     Final Clinical Impressions(s) / UC Diagnoses   Final diagnoses:  Acute non-recurrent maxillary sinusitis  Acute bronchitis, unspecified organism     Discharge Instructions      Take the Augmentin as directed.  Follow up with your primary care provider if your symptoms are not improving.         ED Prescriptions     Medication Sig Dispense Auth. Provider   amoxicillin-clavulanate (AUGMENTIN) 875-125 MG tablet Take 1 tablet by mouth every 12 (twelve) hours for 10 days. 20 tablet Sharion Balloon, NP      PDMP not reviewed this encounter.   Sharion Balloon, NP 09/18/21 [phone]

## 2021-09-25 ENCOUNTER — Encounter: Payer: Self-pay | Admitting: Internal Medicine

## 2021-09-25 ENCOUNTER — Other Ambulatory Visit: Payer: Self-pay

## 2021-09-25 ENCOUNTER — Inpatient Hospital Stay: Payer: Medicare Other | Attending: Internal Medicine

## 2021-09-25 ENCOUNTER — Inpatient Hospital Stay (HOSPITAL_BASED_OUTPATIENT_CLINIC_OR_DEPARTMENT_OTHER): Payer: Medicare Other | Admitting: Internal Medicine

## 2021-09-25 DIAGNOSIS — C61 Malignant neoplasm of prostate: Secondary | ICD-10-CM | POA: Diagnosis not present

## 2021-09-25 LAB — CBC WITH DIFFERENTIAL/PLATELET
Abs Immature Granulocytes: 0.06 10*3/uL (ref 0.00–0.07)
Basophils Absolute: 0 10*3/uL (ref 0.0–0.1)
Basophils Relative: 1 %
Eosinophils Absolute: 0.2 10*3/uL (ref 0.0–0.5)
Eosinophils Relative: 3 %
HCT: 47.5 % (ref 39.0–52.0)
Hemoglobin: 16.2 g/dL (ref 13.0–17.0)
Immature Granulocytes: 1 %
Lymphocytes Relative: 29 %
Lymphs Abs: 1.6 10*3/uL (ref 0.7–4.0)
MCH: 29 pg (ref 26.0–34.0)
MCHC: 34.1 g/dL (ref 30.0–36.0)
MCV: 85.1 fL (ref 80.0–100.0)
Monocytes Absolute: 0.4 10*3/uL (ref 0.1–1.0)
Monocytes Relative: 7 %
Neutro Abs: 3.3 10*3/uL (ref 1.7–7.7)
Neutrophils Relative %: 59 %
Platelets: 212 10*3/uL (ref 150–400)
RBC: 5.58 MIL/uL (ref 4.22–5.81)
RDW: 12.6 % (ref 11.5–15.5)
WBC: 5.5 10*3/uL (ref 4.0–10.5)
nRBC: 0 % (ref 0.0–0.2)

## 2021-09-25 LAB — COMPREHENSIVE METABOLIC PANEL
ALT: 22 U/L (ref 0–44)
AST: 16 U/L (ref 15–41)
Albumin: 4 g/dL (ref 3.5–5.0)
Alkaline Phosphatase: 49 U/L (ref 38–126)
Anion gap: 11 (ref 5–15)
BUN: 19 mg/dL (ref 8–23)
CO2: 26 mmol/L (ref 22–32)
Calcium: 8.8 mg/dL — ABNORMAL LOW (ref 8.9–10.3)
Chloride: 96 mmol/L — ABNORMAL LOW (ref 98–111)
Creatinine, Ser: 0.91 mg/dL (ref 0.61–1.24)
GFR, Estimated: >60 mL/min (ref >60)
Glucose, Bld: 190 mg/dL — ABNORMAL HIGH (ref 70–99)
Potassium: 4 mmol/L (ref 3.5–5.1)
Sodium: 133 mmol/L — ABNORMAL LOW (ref 135–145)
Total Bilirubin: 0.9 mg/dL (ref 0.3–1.2)
Total Protein: 7 g/dL (ref 6.5–8.1)

## 2021-09-25 LAB — PSA: Prostatic Specific Antigen: <0.01 ng/mL (ref 0.00–4.00)

## 2021-09-25 NOTE — Assessment & Plan Note (Addendum)
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 S/P- EBRT to prostate [last Tx- on 01/08/2020]. PET Oct 10th, 2021-Mild asymmetric activity within the prostate gland decreased from comparison from Feb 2021; No convincing evidence of nodal metastasis. No visceral metastasis or skeletal metastasis. Currently off- Darolutamide [sec to poor tolerance].   OCT 2022- <0.01.   #Given patient's preference/side effects-NOV 2022-overall steady PSA less than 0.01. Last eligard May 2022- [place] [Dr.Stoioff]-continue intermittent ADT.  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  # DM-2 on insulin/OHA- BG 190 [PBF]-again discussed importance of tight blood glucose control.  my chart # DISPOSITION:  #  Follow up in 3 months; MD- labs- cbc/cmp/PSA; Dr.B

## 2021-09-25 NOTE — Progress Notes (Signed)
Patient here for oncology follow-up appointment, expresses no complaints or concerns at this time.    

## 2021-09-25 NOTE — Progress Notes (Signed)
[facility] CONSULT NOTE  Patient Care Team: Tonia Ghent, MD as PCP - General (Family Medicine) Wellington Hampshire, MD as PCP - Cardiology (Cardiology)  CHIEF COMPLAINTS/PURPOSE OF CONSULTATION: Prostate cancer  #  Oncology History Overview Note  1. Prostate adenocarcinoma  a. 02/2006 PSA returned elevated at 17.49  b. 04/03/2006; PSA 24.7; prostate biopsy Gleason 3+3 with 4/4 cores c. tx with HIFU overseas late 2007 in [place] [Dr.Cope]] d. persistent PSA elevation over period 2007-2010; nadir 2.1 09/16/06, peak 15.0 ng/mL  e. 04/2006 bone scan and CT abd/pelvis without evidence of metastasis f. 03/08/2007 CT abdomen/pelvis with no definitive metastatic disease noted  g. 03/18/2007 prostascint scan with no evidence of disease but small pelvic lymph nodes noted  h. 08/15/2009 prostascint scan with no evidence of local prostate cancer or distant metastases  i. 08/22/2009 PSA 15 ng/mL; 02/06/2010 PSA 20.5 ng/mL  j. 03/08/2010 PSA 20.9 ng/mL  k. 06/02/10 initiation of degarelix  l. 05/25/10 PET CT (Fluoride) revealed heterogenous areas of FDG-avidity in ribs, femurs, humeri, areas also in the metatarsals noted which may have been arthiritic in nature  m. 07/03/10 PSA 9.6 ng/mL  o.  Started Lupron/Casodex; June 2020-PSA from 0.2 to 0.3 -I;  As per patient-Casodex was withdrawn-but noted to have a jump of PSA to 0.9. FEB 2021-evidence of prostatic malignancy in the residual prostate bed; no distant metastatic disease   # FEB 2021-PET scan uptake in prostate; no bone mets.-EBRT to prostate [finished end of May 2021]; October 2021 PET scan improved prostate uptake; no evidence metastatic disease.  # EARLY NOV-apalutamide 3pills [pt pref]; STOPPED prior to christmas, 2021 [fatigue/?diarrhea]  #comorbidities-CAD s/p CABG; diabetes; PVD  # Prostate cancer-at age of 89 s/p-genetic counseling-NEG.   # NGS/MOLECULAR TESTS:NA  # PALLIATIVE CARE EVALUATION: NA  # PAIN MANAGEMENT:  NA   DIAGNOSIS: [name]  STAGE:    4     ;  GOALS: Palliative/control  CURRENT/MOST RECENT THERAPY :     Prostate cancer ([place])  09/16/2007 Initial Diagnosis   Prostate cancer ([place])   01/22/2020 Genetic Testing   Negative genetic testing:  No pathogenic variants detected on the [facility] Multi-Cancer panel. The report date is 01/22/2020.   The Common Hereditary Cancers Panel offered by [facility] includes sequencing and/or deletion duplication testing of the following 48 genes: APC, ATM, AXIN2, BARD1, BMPR1A, BRCA1, BRCA2, BRIP1, CDH1, CDK4, CDKN2A (p14ARF), CDKN2A (p16INK4a), CHEK2, CTNNA1, DICER1, EPCAM (Deletion/duplication testing only), GREM1 (promoter region deletion/duplication testing only), KIT, MEN1, MLH1, MSH2, MSH3, MSH6, MUTYH, NBN, NF1, NTHL1, PALB2, PDGFRA, PMS2, POLD1, POLE, PTEN, RAD50, RAD51C, RAD51D, RNF43, SDHB, SDHC, SDHD, SMAD4, SMARCA4. STK11, TP53, TSC1, TSC2, and VHL.  The following genes were evaluated for sequence changes only: SDHA and HOXB13 c.251G>A variant only.     HISTORY OF PRESENTING ILLNESS: Patient is ambulating independently.  Accompanied by his wife.  Franchot Mimes 71 y.o.  male with history of castrate resistant prostate cancer with Hx of  metastatic to bone/Eligard-is here for follow-up.  Patient is on intermittent ADT given his preference/side effects from Eligard [Dr.Stoioff]  Mild to moderate fatigue. Patient denies any worsening shortness of breath or cough.  Denies any chills or fevers.     Review of Systems  Constitutional:  Positive for malaise/fatigue. Negative for chills, diaphoresis, fever and weight loss.  HENT:  Negative for nosebleeds and sore throat.   Eyes:  Negative for double vision.  Respiratory:  Negative for cough, hemoptysis, sputum production,  shortness of breath and wheezing.   Cardiovascular:  Negative for chest pain, palpitations, orthopnea and leg swelling.  Gastrointestinal:  Negative  for abdominal pain, blood in stool, constipation, diarrhea, heartburn, melena, nausea and vomiting.  Genitourinary:  Positive for frequency and urgency.  Musculoskeletal:  Positive for back pain. Negative for joint pain.  Skin: Negative.  Negative for itching and rash.  Neurological:  Negative for dizziness, tingling, focal weakness, weakness and headaches.  Endo/Heme/Allergies:  Does not bruise/bleed easily.  Psychiatric/Behavioral:  Negative for depression. The patient is not nervous/anxious and does not have insomnia.     MEDICAL HISTORY:  Past Medical History:  Diagnosis Date   Cataract    bilateral eye in 2010   Coronary artery disease    a. CABG 04/2014: (LIMA->LAD, VG->DIAG, VG->OM2, VG->PDA)  b. 07/2014 early graft failure (VG->OM2 60, LIMA->LAD atretic, VG->PDA & VG->D1 100). s/p successful rotational atherectomy & DES of the LM & pLAD (3.0x24 Promus DES) & mLAD (2.25x24 Promus DES).   CVA (cerebral infarction)    Family history of prostate cancer    Hx of adenomatous colonic polyps 06/17/2017   Hyperlipidemia    a. Statin intolerant.   Hypertension 2000   Moderate mitral regurgitation    a. 02/2015 Echo: EF 55-60%, no rwma, mod MR, mod BAE, mildly dil RV w/ low nl RV fxn.   OSA on CPAP    Peripheral vascular disease ([place])    a. left SFA angioplasty in December 2016   Prostate cancer [facility])    Sleep apnea    wears CPAP nightly   Type II diabetes mellitus ([place])     SURGICAL HISTORY: Past Surgical History:  Procedure Laterality Date   ANTERIOR CERVICAL DECOMP/DISCECTOMY FUSION  08/25/2012   Procedure: ANTERIOR CERVICAL DECOMPRESSION/DISCECTOMY FUSION 2 LEVELS;  Surgeon: Hosie Spangle, MD;  Location: [facility];  Service: Neurosurgery;  Laterality: N/A;  Cervical five-six Cervical six-seven anterior cervical decompression with fusion and plating and bonegraft   BACK SURGERY     CARDIAC CATHETERIZATION  04/2014; 07/19/2014   CORONARY ARTERY BYPASS GRAFT N/A 04/26/2014    Procedure: CORONARY ARTERY BYPASS GRAFTING (CABG), on pump, times four, using left internal mammary artery, right greater saphenous vein harvested endoscopically.;  Surgeon: Ivin Poot, MD;  Location: [facility];  Service: Open Heart Surgery;  Laterality: N/A;  LIMA to LAD, SVG to DIAGONAL, SVG to OM1, SVG to PDA) with EVH of the RIGHT THIGH and LOWER EXTREMITY SAPHENOUS VEIN   Cytoscopy prostatic stone o/w nml  06/21/08   Dr. Jacqlyn Larsen   ETT myoview  09/13/09   Low risk EF 49%   High intense focused ultrasound  07/20/06   By Dr. Jacqlyn Larsen   INTRAOPERATIVE TRANSESOPHAGEAL ECHOCARDIOGRAM N/A 04/26/2014   Procedure: INTRAOPERATIVE TRANSESOPHAGEAL ECHOCARDIOGRAM;  Surgeon: Ivin Poot, MD;  Location: [facility];  Service: Open Heart Surgery;  Laterality: N/A;   PERCUTANEOUS CORONARY ROTOBLATOR INTERVENTION (PCI-R) N/A 07/22/2014   Procedure: PERCUTANEOUS CORONARY ROTOBLATOR INTERVENTION (PCI-R);  Surgeon: Peter M Martinique, MD;  Location: [facility] CATH LAB;  Service: Cardiovascular;  Laterality: N/A;   PERIPHERAL VASCULAR CATHETERIZATION Left 07/26/2015   Procedure: Lower Extremity Angiography;  Surgeon: Katha Cabal, MD;  Location: [facility] CV LAB;  Service: Cardiovascular;  Laterality: Left;   PERIPHERAL VASCULAR CATHETERIZATION  07/26/2015   Procedure: Lower Extremity Intervention;  Surgeon: Katha Cabal, MD;  Location: [facility] CV LAB;  Service: Cardiovascular;;   PROSTATE BIOPSY  04/03/06 & 02/25/07    SOCIAL HISTORY:  Social History   Socioeconomic History   Marital status: Married    Spouse name: Not on file   Number of children: 3   Years of education: Not on file   Highest education level: Not on file  Occupational History   Occupation: Scientist, 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  Tobacco Use   Smoking status: Former    Packs/day: 1.00    Years: 36.00    Pack years: 36.00    Types: Cigarettes    Quit date: 03/01/2014    Years since quitting: 7.6   Smokeless tobacco: Never   Vaping Use   Vaping Use: Never used  Substance and Sexual Activity   Alcohol use: Yes    Comment: 07/19/2014 "might have a drink a couple times/yr"   Drug use: No   Sexual activity: Not Currently  Other Topics Concern   Not on file  Social History Narrative   Lives with wife.; 2 daughters and 1 son.[employer] fan; semi-retd; Arboriculturist; Former Education officer, community, 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 In [place].    Social Determinants of Health   Financial Resource Strain: Not on file  Food Insecurity: Not on file  Transportation Needs: Not on file  Physical Activity: Not on file  Stress: Not on file  Social Connections: Not on file  Intimate Partner Violence: Not on file    FAMILY HISTORY: Family History  Problem Relation Age of Onset   Diabetes Mother 89       DM   Coronary artery disease Mother    Hypertension Mother    Heart attack Mother        CABG with MI in 2005   Heart disease Mother        CV   Hypertension Father    Heart attack Father        CABG with Mi around 1997   Coronary artery disease Father    Heart disease Father        CV   Diabetes Father    Heart attack Brother        MI/PTCA   Heart disease Brother        CV   Diabetes Brother    Heart attack Maternal Grandfather        MI   Prostate cancer Paternal Grandfather 85       likely metastatic   Diabetes Sister        DM   Breast cancer Neg Hx        Breast/ovarian/uterine cancer   Colon cancer Neg Hx    Depression Neg Hx    Alcohol abuse Neg Hx        ETOH/drug abuse   Stroke Neg Hx     ALLERGIES:  is allergic to coreg [carvedilol], metformin and related, protonix [pantoprazole sodium], rosuvastatin calcium, and statins.  MEDICATIONS:  Current Outpatient Medications  Medication Sig Dispense Refill   aspirin EC 81 MG tablet Take 1 tablet (81 mg total) by mouth daily. 90 tablet 3   carvedilol (COREG) 3.125 MG tablet Take 3.125  mg by mouth 2 (two) times daily.     clopidogrel (PLAVIX) 75 MG tablet TAKE 1 TABLET BY MOUTH ONCE DAILY 90 tablet 1   Dulaglutide (TRULICITY) 1.5 FG/1.8EX SOPN Inject 1.5 mg into the skin once a week. 2 mL 11   Evolocumab (REPATHA SURECLICK) 937 MG/ML SOAJ INJECT 1 PEN INTO THE SKIN EVERY 14 DAYS 2 mL 2  ezetimibe (ZETIA) 10 MG tablet Take 1 tablet (10 mg total) by mouth daily. PLEASE SCHEDULE OFFICE VISIT FOR FURTHER REFILLS. THANK YOU! 30 tablet 0   famotidine (PEPCID) 20 MG tablet Take 1 tablet (20 mg total) by mouth daily.     furosemide (LASIX) 20 MG tablet TAKE 1 TABLET BY MOUTH ONCE A DAY 90 tablet 1   glucose blood (ONETOUCH ULTRA) test strip CHECK BLOOD SUGAR TWICE A DAY - DX E11.59 200 strip 3   Injection Device (CORNWALL METAL PIPETTING) MISC Frequency:PHARMDIR   Dosage:0.0     Instructions:  Note:diagnosis 250.02 Dose: NA     insulin aspart (NOVOLOG FLEXPEN) 100 UNIT/ML FlexPen Inject 16-24 Units into the skin daily before supper. 30 mL 3   Insulin Pen Needle 32G X 4 MM MISC Use 1x a day 100 each 3   INVOKANA 300 MG TABS tablet TAKE 1 TABLET BY MOUTH ONCE A DAY WITH BREAKFAST 90 tablet 3   leuprolide (LUPRON) 30 MG injection Inject 45 mg into the muscle every 6 (six) months.     losartan (COZAAR) 25 MG tablet Take 1 tablet (25 mg total) by mouth daily. 90 tablet 1   meclizine (ANTIVERT) 25 MG tablet Take 0.5-1 tablets (12.5-25 mg total) by mouth 2 (two) times daily as needed for dizziness or nausea. 30 tablet 0   MYRBETRIQ 50 MG TB24 tablet TAKE 1 TABLET BY MOUTH ONCE A DAY 30 tablet 12   nitroGLYCERIN (NITROSTAT) 0.4 MG SL tablet Place 1 tablet (0.4 mg total) under the tongue as needed. 25 tablet 2   tamsulosin (FLOMAX) 0.4 MG CAPS capsule TAKE 1 CAPSULE BY MOUTH ONCE DAILY AFTERSUPPER 30 capsule 11   insulin degludec (TRESIBA FLEXTOUCH) 200 UNIT/ML FlexTouch Pen Inject 100 Units into the skin daily. 18 mL 3   No current facility-administered medications for this visit.       Marland Kitchen  PHYSICAL EXAMINATION: ECOG PERFORMANCE STATUS: 0 - Asymptomatic  Vitals:   09/25/21 1100  BP: 93/60  Pulse: 68  Resp: 18  Temp: (!) 97.4 F (36.3 C)  SpO2: 96%   Filed Weights   09/25/21 1100  Weight: 245 lb (111.1 kg)    Physical Exam HENT:     Head: Normocephalic and atraumatic.     Mouth/Throat:     Pharynx: No oropharyngeal exudate.  Eyes:     Pupils: Pupils are equal, round, and reactive to light.  Cardiovascular:     Rate and Rhythm: Normal rate and regular rhythm.  Pulmonary:     Effort: Pulmonary effort is normal. No respiratory distress.     Breath sounds: Normal breath sounds. No wheezing.  Abdominal:     General: Bowel sounds are normal. There is no distension.     Palpations: Abdomen is soft. There is no mass.     Tenderness: There is no abdominal tenderness. There is no guarding or rebound.  Musculoskeletal:        General: No tenderness. Normal range of motion.     Cervical back: Normal range of motion and neck supple.  Skin:    General: Skin is warm.  Neurological:     Mental Status: He is alert and oriented to person, place, and time.  Psychiatric:        Mood and Affect: Affect normal.    LABORATORY DATA:  I have reviewed the data as listed Lab Results  Component Value Date   WBC 5.5 09/25/2021   HGB 16.2 09/25/2021   HCT 47.5  09/25/2021   MCV 85.1 09/25/2021   PLT 212 09/25/2021   Recent Labs    03/27/21 1009 06/26/21 1019 09/25/21 1015  NA 135 134* 133*  K 4.3 4.1 4.0  CL 104 99 96*  CO2 _0 GLUCOSE 179* 338* 190*  BUN 29* 23 19  CREATININE 0.80 0.92 0.91  CALCIUM 8.6* 8.3* 8.8*  GFRNONAA >60 >60 >60  PROT 7.1 6.9 7.0  ALBUMIN 4.3 3.8 4.0  AST _1 ALT _2 ALKPHOS 63 63 49  BILITOT 1.1 0.7 0.9    RADIOGRAPHIC STUDIES: I have personally reviewed the radiological images as listed and agreed with the findings in the report. VAS US CAROTID  Result Date: 09/14/2021 Carotid Arterial Duplex Study  Patient Name:  SR. Franchot Mimes  Date of Exam:   09/14/2021 Medical Rec #: [id number]            Accession #:    [id number] Date of Birth: [birth year]           Patient Gender: M Patient Age:   1 years Exam Location:  [facility] Procedure:      VAS US CAROTID Referring Phys: Rogue Jury ARIDA --------------------------------------------------------------------------------  Indications:       Right bruit and Syncope. Risk Factors:      Hypertension, hyperlipidemia, Diabetes, past history of                    smoking, coronary artery disease, prior CVA, PAD. Comparison Study:  04/22/14 Pre-surgical Doppler. RICA velocities of 76/18 cm/s                    and LICA velocities of 62/69 cm/s Performing Technologist: Pilar Jarvis RDMS, RVT, RDCS  Examination Guidelines: A complete evaluation includes B-mode imaging, spectral Doppler, color Doppler, and power Doppler as needed of all accessible portions of each vessel. Bilateral testing is considered an integral part of a complete examination. Limited examinations for reoccurring indications may be performed as noted.  Right Carotid Findings: +----------+--------+--------+--------+-------------------------+--------+             PSV cm/s EDV cm/s Stenosis Plaque Description        Comments  +----------+--------+--------+--------+-------------------------+--------+  CCA Prox   68       13                                                    +----------+--------+--------+--------+-------------------------+--------+  CCA Distal 80       12       <50%     hyperechoic and irregular           +----------+--------+--------+--------+-------------------------+--------+  ICA Prox   48       13       1-39%    smooth and calcific                 +----------+--------+--------+--------+-------------------------+--------+  ICA Mid    64       18       1-39%                                        +----------+--------+--------+--------+-------------------------+--------+  ICA Distal 63       16                                                     +----------+--------+--------+--------+-------------------------+--------+  ECA        133      15                                                    +----------+--------+--------+--------+-------------------------+--------+ +----------+--------+-------+----------------+-------------------+             PSV cm/s EDV cms Describe         Arm Pressure (mmHG)  +----------+--------+-------+----------------+-------------------+  Subclavian 143              Multiphasic, WNL 140                  +----------+--------+-------+----------------+-------------------+ +---------+--------+--+--------+-+---------+  Vertebral PSV cm/s 51 EDV cm/s 8 Antegrade  +---------+--------+--+--------+-+---------+  Left Carotid Findings: +----------+--------+--------+--------+-------------------------------+--------+             PSV cm/s EDV cm/s Stenosis Plaque Description              Comments  +----------+--------+--------+--------+-------------------------------+--------+  CCA Prox   109      14                                                          +----------+--------+--------+--------+-------------------------------+--------+  CCA Distal 106      19       <50%                                               +----------+--------+--------+--------+-------------------------------+--------+  ICA Prox   103      19       1-39%    heterogenous, irregular,                                                         hypoechoic and diffuse                    +----------+--------+--------+--------+-------------------------------+--------+  ICA Mid    125      18       1-39%                                              +----------+--------+--------+--------+-------------------------------+--------+  ICA Distal 116      22                                                          +----------+--------+--------+--------+-------------------------------+--------+  ECA        154      9                                                            +----------+--------+--------+--------+-------------------------------+--------+ +----------+--------+--------+----------------+-------------------+  PSV cm/s EDV cm/s Describe         Arm Pressure (mmHG)  +----------+--------+--------+----------------+-------------------+  Subclavian 110               Multiphasic, WNL 138                  +----------+--------+--------+----------------+-------------------+ +---------+--------+--+--------+--+---------+  Vertebral PSV cm/s 64 EDV cm/s 14 Antegrade  +---------+--------+--+--------+--+---------+   Summary: Right Carotid: Velocities in the right ICA are consistent with a 1-39% stenosis.                Non-hemodynamically significant plaque <50% noted in the CCA. The                ECA appears <50% stenosed. Left Carotid: Velocities in the left ICA are consistent with a 1-39% stenosis.               Non-hemodynamically significant plaque <50% noted in the CCA. The               ECA appears <50% stenosed. Vertebrals:  Bilateral vertebral arteries demonstrate antegrade flow. Subclavians: Normal flow hemodynamics were seen in bilateral subclavian              arteries. *See table(s) above for measurements and observations.  Electronically signed by [name] MD on 09/14/2021 at 11:19:01 AM.    Final     ASSESSMENT & PLAN:   Prostate cancer ([place]) # STAGE-IV-Prostate cancer-metastatic to bone/castrate resistant. S/P- EBRT to prostate [last Tx- on 01/08/2020]. PET Oct 10th, 2021-Mild asymmetric activity within the prostate gland decreased from comparison from Feb 2021; No convincing evidence of nodal metastasis. No visceral metastasis or skeletal metastasis. Currently off- Darolutamide [sec to poor tolerance].   OCT 2022- <0.01.   #Given patient's preference/side effects-NOV 2022-overall steady PSA less than 0.01. Last eligard May 2022- Eligard [Dr.Stoioff]-continue intermittent ADT.  #Castrate resistant prostate  cancer/history of bone mets--discussed the role of Zometa q3M to decrease skeletal related events-poor tolerance discontinue Zometa.  # DM-2 on insulin/OHA- BG 190 [PBF]-again discussed importance of tight blood glucose control.  my chart # DISPOSITION:  #  Follow up in 3 months; MD- labs- cbc/cmp/PSA; Dr.B    All questions were answered. The patient knows to call the clinic with any problems, questions or concerns.    Cammie Sickle, MD 10/09/2021 3:47 PM

## 2021-10-02 ENCOUNTER — Ambulatory Visit: Payer: Self-pay | Admitting: Urology

## 2021-10-04 ENCOUNTER — Other Ambulatory Visit: Payer: Self-pay | Admitting: Internal Medicine

## 2021-10-09 ENCOUNTER — Encounter: Payer: Self-pay | Admitting: Internal Medicine

## 2021-10-17 DIAGNOSIS — G4733 Obstructive sleep apnea (adult) (pediatric): Secondary | ICD-10-CM | POA: Diagnosis not present

## 2021-10-20 ENCOUNTER — Ambulatory Visit: Payer: Medicare Other | Admitting: Internal Medicine

## 2021-10-20 NOTE — Progress Notes (Unsigned)
Patient ID: Phillip Clements, male   DOB: 1951-02-13, 71 y.o.   MRN: [id number]  This visit occurred during the SARS-CoV-2 public health emergency.  Safety protocols were in place, including screening questions prior to the visit, additional usage of staff PPE, and extensive cleaning of exam room while observing appropriate contact time as indicated for disinfecting solutions.   HPI: Phillip Clements is a 71 y.o.-year-old male, returning for f/u for DM2, dx in 1996, insulin-dependent since 2008, uncontrolled, with complications (CAD - s/p stents, CABG, cerebro-vascular ds - s/p CVA, PVD). Last visit 4 months ago. He changed to [facility] in 10/2016.  Interim history: No blurry vision, nausea, chest pain.  He continues to have increased urination.  Reviewed HbA1c levels: Lab Results  Component Value Date   HGBA1C 8.5 (A) 06/22/2021   HGBA1C 9.8 (A) 02/28/2021   HGBA1C 8.2 (A) 11/01/2020   Pt was on a regimen of: - Invokana 300 mg before b'fast - Trulicity 1.5 mg weekly - Novolog 40 units 3x a day - 15 min before meals - Toujeo 120  units (60 x2) after dinner He tried Victoza and Januvia. Had GI intolerance (nausea, diarrhea) to regular Metformin and also with metformin ER >>stopped.  He was then on: - Invokana 300 mg before breakfast - Trulicity 1.5 mg weekly - U500 insulin 55-70 units 3 times a day before meals Also, if sugars in the morning are: - 160-200, take 10 units - 201-240, take 15 units - >240, take 20 units  He is currently on: - Invokana 300 mg bfore b'fast - Trulicity 1.5 >> 3  mg weekly  - stopped 2/2 diarrhea, nausea >> restarted at 1.5 mg weekly 02/2021 - restarted 05/2021 (retried  the 4.5 mg dose >> N/D >> stopped) - Tresiba 32 >> 44 units daily-started 10/2018 >> 66 >> 86 >> 100 units daily - NovoLog 10-16 >> 16-24 >> 20 units  (after dinner!)  >> 20 units >> 16 to 24 units before meals   He is checking sugars once a day: - am: n/c >> 115-195, 226 >> 168-263 >>  123-210 >> 105-283, 359 - 2h after b'fast: n/c  - before lunch: 100-120 >> n/c >> 126-166 >> n/c >> 133-178 - 2h after lunch: n/c >> 170 - before dinner: 110-169, 191 >> 120-311 (snack) >> 113-189 - 2h after dinner: 280 >> 224-269 >> 297, 367 >> 180s  - bedtime: 201, 214 >> 128-150 >> n/c>> 116-307 - nighttime: n/c 196-317 Lowest sugar was 56 >> ... 120; he has hypoglycemia awareness in the 70s. Highest sugar was  445 >> .Marland KitchenMarland Kitchen300s >> 359  Glucometer: OneTouch Ultra mini  Pt's meals are mostly plant-based: - Breakfast:  Skips >> PB and apple - Lunch: sandwich, chips, cottage cheese, pineapple >> skips - Dinner: 2 veggies, salads; Sat night: meat >> grilled chicken and salad - Snacks: 2/day: celery + PB; low salt triscuit + laughing cow cheese  He continues to walk on the treadmill 2 out of 7 days at the gym and working outside.  + Mild CKD, last BUN/creatinine:  Lab Results  Component Value Date   BUN 19 09/25/2021   CREATININE 0.91 09/25/2021  On losartan. -+ HL: Lab Results  Component Value Date   CHOL 166 10/09/2019   HDL 42 10/09/2019   LDLCALC 96 10/09/2019   LDLDIRECT 157.1 09/07/2013   TRIG 138 10/09/2019   CHOLHDL 4.0 10/09/2019  He could not tolerate statins due to joint pains.  He continues on  Repatha and Zetia.  - last eye exam was in 05/2020: No DR reportedly; + history of cataract Clements. Phillip Clements.  - no Numbness and tingling in his feet.  He has prostate cancer, believed to be metastatic to the bones.  On Lupron.  He finished radiation therapy.  ROS: + see HPI + fatigue  I reviewed pt's medications, allergies, PMH, social hx, family hx, and changes were documented in the history of present illness. Otherwise, unchanged from my initial visit note.  Past Medical History:  Diagnosis Date   Cataract    bilateral eye in 2010   Coronary artery disease    a. CABG 04/2014: (LIMA->LAD, VG->DIAG, VG->OM2, VG->PDA)  b. 07/2014 early graft failure  (VG->OM2 60, LIMA->LAD atretic, VG->PDA & VG->D1 100). s/p successful rotational atherectomy & DES of the LM & pLAD (3.0x24 Promus DES) & mLAD (2.25x24 Promus DES).   CVA (cerebral infarction)    Family history of prostate cancer    Hx of adenomatous colonic polyps 06/17/2017   Hyperlipidemia    a. Statin intolerant.   Hypertension 2000   Moderate mitral regurgitation    a. 02/2015 Echo: EF 55-60%, no rwma, mod MR, mod BAE, mildly dil RV w/ low nl RV fxn.   OSA on CPAP    Peripheral vascular disease (Phillip Clements)    a. left SFA angioplasty in December 2016   Prostate cancer Phillip Clements)    Sleep apnea    wears CPAP nightly   Type II diabetes mellitus Phillip Clements)    Past Surgical History:  Procedure Laterality Date   ANTERIOR CERVICAL DECOMP/DISCECTOMY FUSION  08/25/2012   Procedure: ANTERIOR CERVICAL DECOMPRESSION/DISCECTOMY FUSION 2 LEVELS;  Surgeon: Phillip Spangle, MD;  Location: [facility];  Service: Neurosurgery;  Laterality: N/A;  Cervical five-six Cervical six-seven anterior cervical decompression with fusion and plating and bonegraft   BACK [facility]     CARDIAC CATHETERIZATION  04/2014; 07/19/2014   CORONARY ARTERY BYPASS GRAFT N/A 04/26/2014   Procedure: CORONARY ARTERY BYPASS GRAFTING (CABG), on pump, times four, using left internal mammary artery, right greater saphenous vein harvested endoscopically.;  Surgeon: Phillip Poot, MD;  Location: [facility];  Service: [facility];  Laterality: N/A;  LIMA to LAD, SVG to DIAGONAL, SVG to OM1, SVG to PDA) with EVH of the RIGHT THIGH and LOWER EXTREMITY SAPHENOUS VEIN   Cytoscopy prostatic stone o/w nml  06/21/08   Dr. Jacqlyn Clements   ETT myoview  09/13/09   Low risk EF 49%   High intense focused ultrasound  07/20/06   By Dr. Jacqlyn Clements   INTRAOPERATIVE TRANSESOPHAGEAL ECHOCARDIOGRAM N/A 04/26/2014   Procedure: INTRAOPERATIVE TRANSESOPHAGEAL ECHOCARDIOGRAM;  Surgeon: Phillip Poot, MD;  Location: [facility];  Service: [facility];  Laterality: N/A;   PERCUTANEOUS  CORONARY ROTOBLATOR INTERVENTION (PCI-R) N/A 07/22/2014   Procedure: PERCUTANEOUS CORONARY ROTOBLATOR INTERVENTION (PCI-R);  Surgeon: Peter M Martinique, MD;  Location: [facility] C[facility] LAB;  Service: Cardiovascular;  Laterality: N/A;   PERIPHERAL VASCULAR CATHETERIZATION Left 07/26/2015   Procedure: Lower Extremity Angiography;  Surgeon: Katha Cabal, MD;  Location: [place] [facility];  Service: Cardiovascular;  Laterality: Left;   PERIPHERAL VASCULAR CATHETERIZATION  07/26/2015   Procedure: Lower Extremity Intervention;  Surgeon: Katha Cabal, MD;  Location: [place] [facility];  Service: Cardiovascular;;   PROSTATE BIOPSY  04/03/06 & 02/25/07   Social History   Socioeconomic History   Marital status: Married    Spouse name: Not on file   Number of children:  3   Years of education: Not on file   Highest education level: Not on file  Occupational History   Occupation: Scientist, research (physical sciences) and [employer]  Tobacco Use   Smoking status: Former    Packs/day: 1.00    Years: 36.00    Pack years: 36.00    Types: Cigarettes    Quit date: 03/01/2014    Years since quitting: 7.6   Smokeless tobacco: Never  Vaping Use   Vaping Use: Never used  Substance and Sexual Activity   Alcohol use: Yes    Comment: 07/19/2014 "might have a drink a couple times/yr"   Drug use: No   Sexual activity: Not Currently  Other Topics Concern   Not on file  Social History Narrative   Lives with wife.; 2 daughters and 1 son.[employer] fan; semi-retd; Arboriculturist; Former Education officer, community, 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 In [place].    Social Determinants of Health   Financial Resource Strain: Not on file  Food Insecurity: Not on file  Transportation Needs: Not on file  Physical Activity: Not on file  Stress: Not on file  Social Connections: Not on file  Intimate Partner Violence: Not on file   Current Outpatient Medications on File  Prior to Visit  Medication Sig Dispense Refill   aspirin EC 81 MG tablet Take 1 tablet (81 mg total) by mouth daily. 90 tablet 3   carvedilol (COREG) 3.125 MG tablet Take 3.125 mg by mouth 2 (two) times daily.     clopidogrel (PLAVIX) 75 MG tablet TAKE 1 TABLET BY MOUTH ONCE DAILY 90 tablet 1   Dulaglutide (TRULICITY) 1.5 SM/2.7MB SOPN Inject 1.5 mg into the skin once a week. 2 mL 11   Evolocumab (REPATHA SURECLICK) 867 MG/ML SOAJ INJECT 1 PEN INTO THE SKIN EVERY 14 DAYS 2 mL 2   ezetimibe (ZETIA) 10 MG tablet Take 1 tablet (10 mg total) by mouth daily. PLEASE SCHEDULE OFFICE VISIT FOR FURTHER REFILLS. THANK YOU! 30 tablet 0   famotidine (PEPCID) 20 MG tablet Take 1 tablet (20 mg total) by mouth daily.     furosemide (LASIX) 20 MG tablet TAKE 1 TABLET BY MOUTH ONCE A DAY 90 tablet 1   glucose blood (ONETOUCH ULTRA) test strip CHECK BLOOD SUGAR TWICE A DAY - DX E11.59 200 strip 3   Injection Device (CORNWALL METAL PIPETTING) MISC Frequency:PHARMDIR   Dosage:0.0     Instructions:  Note:diagnosis 250.02 Dose: NA     insulin aspart (NOVOLOG FLEXPEN) 100 UNIT/ML FlexPen Inject 16-24 Units into the skin daily before supper. 30 mL 3   insulin degludec (TRESIBA FLEXTOUCH) 200 UNIT/ML FlexTouch Pen Inject 100 Units into the skin daily. 18 mL 3   Insulin Pen Needle 32G X 4 MM MISC Use 1x a day 100 each 3   INVOKANA 300 MG TABS tablet TAKE 1 TABLET BY MOUTH ONCE A DAY WITH BREAKFAST 90 tablet 3   leuprolide (LUPRON) 30 MG injection Inject 45 mg into the muscle every 6 (six) months.     losartan (COZAAR) 25 MG tablet Take 1 tablet (25 mg total) by mouth daily. 90 tablet 1   meclizine (ANTIVERT) 25 MG tablet Take 0.5-1 tablets (12.5-25 mg total) by mouth 2 (two) times daily as needed for dizziness or nausea. 30 tablet 0   MYRBETRIQ 50 MG TB24 tablet TAKE 1 TABLET BY MOUTH ONCE A DAY 30 tablet 12   nitroGLYCERIN (NITROSTAT) 0.4 MG  SL tablet Place 1 tablet (0.4 mg total) under the tongue as needed. 25 tablet 2    tamsulosin (FLOMAX) 0.4 MG CAPS capsule TAKE 1 CAPSULE BY MOUTH ONCE DAILY AFTERSUPPER 30 capsule 11   No current facility-administered medications on file prior to visit.   Allergies  Allergen Reactions   Coreg [Carvedilol] Other (See Comments)    Fatigue- unclear if this was due to coreg specifically or beta blockers in general.     Metformin And Related Nausea Only   Protonix [Pantoprazole Sodium] Diarrhea   Rosuvastatin Calcium     REACTION: JOINT ACHES   Statins     REACTION: JOINT ACHES   Family History  Problem Relation Age of Onset   Diabetes Mother 31       DM   Coronary artery disease Mother    Hypertension Mother    Heart attack Mother        CABG with MI in 2005   Heart disease Mother        CV   Hypertension Father    Heart attack Father        CABG with Mi around 1997   Coronary artery disease Father    Heart disease Father        CV   Diabetes Father    Heart attack Brother        MI/PTCA   Heart disease Brother        CV   Diabetes Brother    Heart attack Maternal Grandfather        MI   Prostate cancer Paternal Grandfather 10       likely metastatic   Diabetes Sister        DM   Breast cancer Neg Hx        Breast/ovarian/uterine cancer   Colon cancer Neg Hx    Depression Neg Hx    Alcohol abuse Neg Hx        ETOH/drug abuse   Stroke Neg Hx    PE: There were no vitals taken for this visit. There is no height or weight on file to calculate BMI. Wt Readings from Last 3 Encounters:  09/25/21 245 lb (111.1 kg)  08/17/21 248 lb 2 oz (112.5 kg)  06/26/21 246 lb (111.6 kg)   Constitutional: overweight, in NAD Eyes: PERRLA, EOMI, no exophthalmos ENT: moist mucous membranes, no thyromegaly, no cervical lymphadenopathy Cardiovascular: RRR, No MRG Respiratory: CTA B Musculoskeletal: no deformities, strength intact in all 4 Skin: moist, warm, no rashes Neurological: no tremor with outstretched hands, DTR normal in all 4  ASSESSMENT: 1. DM2,  insulin-dependent, uncontrolled, with complications - CAD - s/p stents, CABG - cerebro-vascular ds - s/p CVA  - PVD - s/p L stent  2. HL  3.  Obesity class II BMI Classification: < 18.5 underweight  18.5-24.9 normal weight  25.0-29.9 overweight  30.0-34.9 class I obesity  35.0-39.9 class II obesity  ? 40.0 class III obesity   PLAN:  1. Patient with longstanding, insulin resistant type 2 diabetes, previously on U-500, but then off insulin after improvement in his blood sugars while on Invokana and Trulicity.  However, in 2021, he relaxed his diet, gained a significant amount of weight and sugars worsened so we had to add back insulin.  He is currently on [place] and NovoLog.  Sugars are very variable at last visit, with some values in the 300s, but without lows.  Unfortunately, due to the high variability of blood sugars especially  in the morning and at bedtime, it was difficult to make changes in his regimen.  He just restarted Trulicity 2 weeks prior to our last visit but he was not taking NovoLog as recommended, only taking fixed doses, 20 units before each meal.  I advised him to vary the dose based on the size of the meal and the sugars before the meal.  I also recommended a freestyle libre CGM and given a list of suppliers.  HbA1c at that time was slightly lower, at 8.5%.  - I suggested to: Patient Instructions  Please continue: - Invokana  300 mg bfore b'fast - Trulicity 1.5 mg weekly - Tresiba 100 units daily  - NovoLog 16-24 units 15 min before your main meals  The most common suppliers for the  [employer] continuous glucose monitor (CGM) are: [place] [employer]: [place]: [phone] Ext [phone] [employer]: [place]: [place]: [phone] [place]: [phone]  Please return in 4 months with your sugar log/CGM.   - we checked his HbA1c: 7%  - advised to check sugars at  different times of the day - 4x a day, rotating check times - advised for yearly eye exams >> he is not UTD - return to clinic in 4 months  2. HL -Reviewed latest lipid panel from 09/2019: LDL above target, the rest of the fractions at goal: Lab Results  Component Value Date   CHOL 166 10/09/2019   HDL 42 10/09/2019   LDLCALC 96 10/09/2019   LDLDIRECT 157.1 09/07/2013   TRIG 138 10/09/2019   CHOLHDL 4.0 10/09/2019  -He is intolerant to statins due to joint aches.  He is on Repatha and ezetimibe 10 mg daily without side effects. -He is due for another lipid panel.  He is refused this at last visit pending an appointment with cardiology.  However, he did not have a repeat lipid panel yet-we will check this today.  3. Obesity class II -Possibly also contributed to by his Lupron injections -Previously lost a significant amount of weight (35 pounds) on a keto diet.  Weight fluctuating afterwards, but then he gained ~20 pounds. -continue SGLT 2 inhibitor and GLP-1 receptor agonist which should also help with weight loss -He previously had GI symptoms with Trulicity and was off the medication, but restarted the 1.5 mg dose 2 weeks prior to our last visit. -She lost 6 pounds before last visit  Philemon Kingdom, MD PhD [facility]

## 2021-10-24 DIAGNOSIS — Z20822 Contact with and (suspected) exposure to covid-19: Secondary | ICD-10-CM | POA: Diagnosis not present

## 2021-10-25 ENCOUNTER — Other Ambulatory Visit: Payer: Self-pay | Admitting: Cardiovascular Disease

## 2021-11-20 ENCOUNTER — Other Ambulatory Visit: Payer: Self-pay | Admitting: Cardiovascular Disease

## 2021-11-20 DIAGNOSIS — I10 Essential (primary) hypertension: Secondary | ICD-10-CM

## 2021-11-20 DIAGNOSIS — I2510 Atherosclerotic heart disease of native coronary artery without angina pectoris: Secondary | ICD-10-CM

## 2021-11-20 DIAGNOSIS — E785 Hyperlipidemia, unspecified: Secondary | ICD-10-CM

## 2021-11-24 ENCOUNTER — Inpatient Hospital Stay: Payer: Medicare Other | Attending: Internal Medicine

## 2021-11-24 DIAGNOSIS — C61 Malignant neoplasm of prostate: Secondary | ICD-10-CM | POA: Insufficient documentation

## 2021-11-24 LAB — PSA: Prostatic Specific Antigen: <0.01 ng/mL (ref 0.00–4.00)

## 2021-11-30 ENCOUNTER — Ambulatory Visit (INDEPENDENT_AMBULATORY_CARE_PROVIDER_SITE_OTHER): Payer: Medicare Other | Admitting: Dermatology

## 2021-11-30 DIAGNOSIS — L821 Other seborrheic keratosis: Secondary | ICD-10-CM

## 2021-11-30 DIAGNOSIS — L578 Other skin changes due to chronic exposure to nonionizing radiation: Secondary | ICD-10-CM

## 2021-11-30 DIAGNOSIS — I2510 Atherosclerotic heart disease of native coronary artery without angina pectoris: Secondary | ICD-10-CM | POA: Diagnosis not present

## 2021-11-30 DIAGNOSIS — L570 Actinic keratosis: Secondary | ICD-10-CM

## 2021-11-30 NOTE — Progress Notes (Signed)
? ?  Follow-Up Visit ?  ?Subjective  ?Phillip Clements is a 71 y.o. male who presents for the following: Actinic Keratosis (Face, 100mf/u). ?The patient has spots, moles and lesions to be evaluated, some may be new or changing and the patient has concerns that these could be cancer. ? ?The following portions of the chart were reviewed this encounter and updated as appropriate:  ? Tobacco  Allergies  Meds  Problems  Med Hx  Surg Hx  Fam Hx   ?  ?Review of Systems:  No other skin or systemic complaints except as noted in HPI or Assessment and Plan. ? ?Objective  ?Well appearing patient in no apparent distress; mood and affect are within normal limits. ? ?A focused examination was performed including face. Relevant physical exam findings are noted in the Assessment and Plan. ? ?R forehead x 1, nose x 1, R cheek x 1 (3) ?Pink scaly macules ? ? ?Assessment & Plan  ? ?Actinic Damage ?- chronic, secondary to cumulative UV radiation exposure/sun exposure over time ?- diffuse scaly erythematous macules with underlying dyspigmentation ?- Recommend daily broad spectrum sunscreen SPF 30+ to sun-exposed areas, reapply every 2 hours as needed.  ?- Recommend staying in the shade or wearing long sleeves, sun glasses (UVA+UVB protection) and wide brim hats (4-inch brim around the entire circumference of the hat). ?- Call for new or changing lesions.  ? ?Seborrheic Keratoses ?- Stuck-on, waxy, tan-brown papules and/or plaques  ?- Benign-appearing ?- Discussed benign etiology and prognosis. ?- Observe ?- Call for any changes ? ?AK (actinic keratosis) (3) ?R forehead x 1, nose x 1, R cheek x 1 ? ?Destruction of lesion - R forehead x 1, nose x 1, R cheek x 1 ?Complexity: simple   ?Destruction method: cryotherapy   ?Informed consent: discussed and consent obtained   ?Timeout:  patient name, date of birth, surgical site, and procedure verified ?Lesion destroyed using liquid nitrogen: Yes   ?Region frozen until ice ball extended beyond  lesion: Yes   ?Outcome: patient tolerated procedure well with no complications   ?Post-procedure details: wound care instructions given   ? ? ?Return in about 1 year (around 12/01/2022) for TBSE, Hx of AKs. ? ?I, SOthelia Pulling RMA, am acting as scribe for DSarina Ser MD . ?Documentation: I have reviewed the above documentation for accuracy and completeness, and I agree with the above. ? ?DSarina Ser MD ? ? ?

## 2021-11-30 NOTE — Patient Instructions (Addendum)

## 2021-12-01 ENCOUNTER — Other Ambulatory Visit: Payer: Self-pay | Admitting: *Deleted

## 2021-12-01 ENCOUNTER — Encounter: Payer: Self-pay | Admitting: Radiation Oncology

## 2021-12-01 ENCOUNTER — Ambulatory Visit
Admission: RE | Admit: 2021-12-01 | Discharge: 2021-12-01 | Disposition: A | Discharge status: Home / Self Care | Payer: Medicare Other | Source: Ambulatory Visit | Attending: Radiation Oncology | Admitting: Radiation Oncology

## 2021-12-01 VITALS — BP 125/64 | HR 66 | Temp 97.6°F | Resp 16 | Wt 250.7 lb

## 2021-12-01 DIAGNOSIS — C61 Malignant neoplasm of prostate: Secondary | ICD-10-CM | POA: Diagnosis not present

## 2021-12-01 DIAGNOSIS — Z08 Encounter for follow-up examination after completed treatment for malignant neoplasm: Secondary | ICD-10-CM | POA: Diagnosis not present

## 2021-12-01 NOTE — Progress Notes (Signed)
Radiation Oncology ?Follow up Note ? ?Name: ARIO MCDIARMID   ?Date:   12/01/2021 ?MRN:  [id number] ?DOB: Dec 08, 1950  ? ? ?This 71 y.o. male presents to the clinic today for 2-year follow-up status post salvage radiation therapy after HIFU for Gleason 6 adenocarcinoma the prostate originally presenting with a PSA of 24. ? ?REFERRING PROVIDER: Tonia Ghent, MD ? ?HPI: Patient is a 70 year old male now out 2 years having completed salvage radiation therapy status post HIFU for Gleason 6 adenocarcinoma presenting with a PSA of 24.  Seen today in routine follow-up he is doing well.  Does have some occasional nocturia and occasional frequency of urination.  No bowel problems noted.  His most recent PSA is less than 0.01. ? ?COMPLICATIONS OF TREATMENT: none ? ?FOLLOW UP COMPLIANCE: keeps appointments  ? ?PHYSICAL EXAM:  ?BP 125/64 (BP Location: Right Arm, Patient Position: Sitting, Cuff Size: Large)   Pulse 66   Temp 97.6 ?F (36.4 ?C) (Tympanic)   Resp 16   Wt 250 lb 11.2 oz (113.7 kg)   BMI 37.02 kg/m?  ?Well-developed well-nourished patient in NAD. HEENT reveals PERLA, EOMI, discs not visualized.  Oral cavity is clear. No oral mucosal lesions are identified. Neck is clear without evidence of cervical or supraclavicular adenopathy. Lungs are clear to A&P. Cardiac examination is essentially unremarkable with regular rate and rhythm without murmur rub or thrill. Abdomen is benign with no organomegaly or masses noted. Motor sensory and DTR levels are equal and symmetric in the upper and lower extremities. Cranial nerves II through XII are grossly intact. Proprioception is intact. No peripheral adenopathy or edema is identified. No motor or sensory levels are noted. Crude visual fields are within normal range. ? ?RADIOLOGY RESULTS: No current films for review ? ?PLAN: Present time patient is under excellent biochemical control of his prostate cancer.  Pleased with his overall progress.  Of asked to see him back in 1  year for follow-up with repeat PSA.  Patient knows to call with any concerns. ? ?I would like to take this opportunity to thank you for allowing me to participate in the care of your patient.. ?  ? Noreene Filbert, MD ? ?

## 2021-12-09 ENCOUNTER — Encounter: Payer: Self-pay | Admitting: Dermatology

## 2021-12-18 ENCOUNTER — Telehealth: Payer: Self-pay | Admitting: Family Medicine

## 2021-12-18 DIAGNOSIS — Z20822 Contact with and (suspected) exposure to covid-19: Secondary | ICD-10-CM | POA: Diagnosis not present

## 2021-12-18 NOTE — Telephone Encounter (Signed)
Left message for patient to call back and schedule Medicare Annual Wellness Visit (AWV) to be completed by video or phone. ? ? ? ?Last AWV: 11/28/2018 ? ? ? ?Please schedule at anytime with  ?[facility]    ? ? ? ?45 minute appointment ? ? ? ?Any questions, please contact me at [phone] ?

## 2021-12-25 ENCOUNTER — Inpatient Hospital Stay: Payer: Medicare Other | Attending: Internal Medicine

## 2021-12-25 ENCOUNTER — Inpatient Hospital Stay (HOSPITAL_BASED_OUTPATIENT_CLINIC_OR_DEPARTMENT_OTHER): Payer: Medicare Other | Admitting: Internal Medicine

## 2021-12-25 ENCOUNTER — Encounter: Payer: Self-pay | Admitting: Internal Medicine

## 2021-12-25 DIAGNOSIS — E559 Vitamin D deficiency, unspecified: Secondary | ICD-10-CM

## 2021-12-25 DIAGNOSIS — C61 Malignant neoplasm of prostate: Secondary | ICD-10-CM | POA: Diagnosis not present

## 2021-12-25 LAB — CBC WITH DIFFERENTIAL/PLATELET
Abs Immature Granulocytes: 0.04 10*3/uL (ref 0.00–0.07)
Basophils Absolute: 0.1 10*3/uL (ref 0.0–0.1)
Basophils Relative: 1 %
Eosinophils Absolute: 0.1 10*3/uL (ref 0.0–0.5)
Eosinophils Relative: 2 %
HCT: 47.8 % (ref 39.0–52.0)
Hemoglobin: 16.2 g/dL (ref 13.0–17.0)
Immature Granulocytes: 1 %
Lymphocytes Relative: 22 %
Lymphs Abs: 1.5 10*3/uL (ref 0.7–4.0)
MCH: 29.3 pg (ref 26.0–34.0)
MCHC: 33.9 g/dL (ref 30.0–36.0)
MCV: 86.4 fL (ref 80.0–100.0)
Monocytes Absolute: 0.5 10*3/uL (ref 0.1–1.0)
Monocytes Relative: 7 %
Neutro Abs: 4.6 10*3/uL (ref 1.7–7.7)
Neutrophils Relative %: 67 %
Platelets: 211 10*3/uL (ref 150–400)
RBC: 5.53 MIL/uL (ref 4.22–5.81)
RDW: 12.6 % (ref 11.5–15.5)
WBC: 6.8 10*3/uL (ref 4.0–10.5)
nRBC: 0 % (ref 0.0–0.2)

## 2021-12-25 LAB — COMPREHENSIVE METABOLIC PANEL
ALT: 18 U/L (ref 0–44)
AST: 15 U/L (ref 15–41)
Albumin: 3.8 g/dL (ref 3.5–5.0)
Alkaline Phosphatase: 61 U/L (ref 38–126)
Anion gap: 7 (ref 5–15)
BUN: 22 mg/dL (ref 8–23)
CO2: 26 mmol/L (ref 22–32)
Calcium: 8.5 mg/dL — ABNORMAL LOW (ref 8.9–10.3)
Chloride: 100 mmol/L (ref 98–111)
Creatinine, Ser: 0.99 mg/dL (ref 0.61–1.24)
GFR, Estimated: >60 mL/min (ref >60)
Glucose, Bld: 341 mg/dL — ABNORMAL HIGH (ref 70–99)
Potassium: 4.3 mmol/L (ref 3.5–5.1)
Sodium: 133 mmol/L — ABNORMAL LOW (ref 135–145)
Total Bilirubin: 0.9 mg/dL (ref 0.3–1.2)
Total Protein: 6.7 g/dL (ref 6.5–8.1)

## 2021-12-25 LAB — PSA: Prostatic Specific Antigen: <0.01 ng/mL (ref 0.00–4.00)

## 2021-12-25 NOTE — Assessment & Plan Note (Addendum)
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 S/P- EBRT to prostate [last Tx- on 01/08/2020]. PET Oct 10th, 2021-Mild asymmetric activity within the prostate gland decreased from comparison from Feb 2021; No convincing evidence of nodal metastasis. No visceral metastasis or skeletal metastasis. Currently off- Darolutamide [sec to poor tolerance].   April 2023-- <0.01.  ? ?#Given patient's preference/side effects- overall steady PSA less than 0.01. Last eligard May 2022- [place] [Dr.Stoioff]-continue intermittent ADT.  Continue to hold off on EGD at this time. ? 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 ? ?#Hypocalcemia-calcium 8.2.  Recommend calcium plus vitamin D; 1000 units once a day. ? ?# DM-2 on insulin/OHA- BG 341  [PBF; Dr.Gherge]-again discussed importance of tight blood glucose control. ? ?my chart ?# DISPOSITION:  ?#  Follow up in 3 months; MD- labs- cbc/cmp/PSA;Vit D- 25-OH levels-  Dr.B ? ? ?

## 2021-12-25 NOTE — Progress Notes (Signed)
[facility] ?CONSULT NOTE ? ?Patient Care Team: ?Tonia Ghent, MD as PCP - General (Family Medicine) ?Wellington Hampshire, MD as PCP - Cardiology (Cardiology) ? ?CHIEF COMPLAINTS/PURPOSE OF CONSULTATION: Prostate cancer ? ?#  ?Oncology History Overview Note  ?1. Prostate adenocarcinoma  ?a. 02/2006 PSA returned elevated at 17.49  ?b. 04/03/2006; PSA 24.7; prostate biopsy Gleason 3+3 with 4/4 cores ?c. tx with HIFU overseas late 2007 in [place] [Dr.Cope]] ?d. persistent PSA elevation over period 2007-2010; nadir 2.1 09/16/06, peak 15.0 ng/mL  ?e. 04/2006 bone scan and CT abd/pelvis without evidence of metastasis ?f. 03/08/2007 CT abdomen/pelvis with no definitive metastatic disease noted  ?g. 03/18/2007 prostascint scan with no evidence of disease but small pelvic lymph nodes noted  ?h. 08/15/2009 prostascint scan with no evidence of local prostate cancer or distant metastases  ?i. 08/22/2009 PSA 15 ng/mL; 02/06/2010 PSA 20.5 ng/mL  ?j. 03/08/2010 PSA 20.9 ng/mL  ?k. 06/02/10 initiation of degarelix  ?l. 05/25/10 PET CT (Fluoride) revealed heterogenous areas of FDG-avidity in ribs, femurs, humeri, areas also in the metatarsals noted which may have been arthiritic in nature  ?m. 07/03/10 PSA 9.6 ng/mL  ?o.  Started Lupron/Casodex; June 2020-PSA from 0.2 to 0.3 -I;  As per patient-Casodex was withdrawn-but noted to have a jump of PSA to 0.9. FEB 2021-evidence of prostatic malignancy in the residual prostate bed; no distant metastatic disease ? ? ?# FEB 2021-PET scan uptake in prostate; no bone mets.-EBRT to prostate [finished end of May 2021]; October 2021 PET scan improved prostate uptake; no evidence metastatic disease. ? ?# EARLY NOV-apalutamide 3pills [pt pref]; STOPPED prior to christmas, 2021 [fatigue/?diarrhea] ? ?#comorbidities-CAD s/p CABG; diabetes; PVD ? ?# Prostate cancer-at age of 74 s/p-genetic counseling-NEG.  ? ?# NGS/MOLECULAR TESTS:NA ? ?# PALLIATIVE CARE EVALUATION: NA ? ?# PAIN MANAGEMENT:  NA ? ? ?DIAGNOSIS: [name] ? ?STAGE:    4     ;  GOALS: Palliative/control ? ?CURRENT/MOST RECENT THERAPY :  ? ?  ?Prostate cancer ([place])  ?09/16/2007 Initial Diagnosis  ? Prostate cancer [facility]) ?  ?01/22/2020 Genetic Testing  ? Negative genetic testing:  No pathogenic variants detected on the [facility] Multi-Cancer panel. The report date is 01/22/2020.  ? ?The Common Hereditary Cancers Panel offered by [facility] includes sequencing and/or deletion duplication testing of the following 48 genes: APC, ATM, AXIN2, BARD1, BMPR1A, BRCA1, BRCA2, BRIP1, CDH1, CDK4, CDKN2A (p14ARF), CDKN2A (p16INK4a), CHEK2, CTNNA1, DICER1, EPCAM (Deletion/duplication testing only), GREM1 (promoter region deletion/duplication testing only), KIT, MEN1, MLH1, MSH2, MSH3, MSH6, MUTYH, NBN, NF1, NTHL1, PALB2, PDGFRA, PMS2, POLD1, POLE, PTEN, RAD50, RAD51C, RAD51D, RNF43, SDHB, SDHC, SDHD, SMAD4, SMARCA4. STK11, TP53, TSC1, TSC2, and VHL.  The following genes were evaluated for sequence changes only: SDHA and HOXB13 c.251G>A variant only.  ?  ? ?HISTORY OF PRESENTING ILLNESS: Patient is ambulating independently.  Accompanied by his wife. ? ?Phillip Clements 71 y.o.  male with history of castrate resistant prostate cancer with Hx of  metastatic to bone/Eligard-is here for follow-up. ? ?Patient is on intermittent ADT given his preference/side effects from Eligard [Dr.Stoioff].  Last Eligard was approximately year ago. ? ?Mild to moderate fatigue. Patient denies any worsening shortness of breath or cough.  Denies any chills or fevers.   ? ? ?Review of Systems  ?Constitutional:  Positive for malaise/fatigue. Negative for chills, diaphoresis, fever and weight loss.  ?HENT:  Negative for nosebleeds and sore throat.   ?Eyes:  Negative for double vision.  ?Respiratory:  Negative for cough, hemoptysis, sputum production, shortness of breath and wheezing.   ?Cardiovascular:  Negative for chest pain, palpitations, orthopnea and  leg swelling.  ?Gastrointestinal:  Negative for abdominal pain, blood in stool, constipation, diarrhea, heartburn, melena, nausea and vomiting.  ?Genitourinary:  Positive for frequency and urgency.  ?Musculoskeletal:  Positive for back pain. Negative for joint pain.  ?Skin: Negative.  Negative for itching and rash.  ?Neurological:  Negative for dizziness, tingling, focal weakness, weakness and headaches.  ?Endo/Heme/Allergies:  Does not bruise/bleed easily.  ?Psychiatric/Behavioral:  Negative for depression. The patient is not nervous/anxious and does not have insomnia.    ? ?MEDICAL HISTORY:  ?Past Medical History:  ?Diagnosis Date  ? Cataract   ? bilateral eye in 2010  ? Coronary artery disease   ? a. CABG 04/2014: (LIMA->LAD, VG->DIAG, VG->OM2, VG->PDA)  b. 07/2014 early graft failure (VG->OM2 60, LIMA->LAD atretic, VG->PDA & VG->D1 100). s/p successful rotational atherectomy & DES of the LM & pLAD (3.0x24 Promus DES) & mLAD (2.25x24 Promus DES).  ? CVA (cerebral infarction)   ? Family history of prostate cancer   ? Hx of adenomatous colonic polyps 06/17/2017  ? Hyperlipidemia   ? a. Statin intolerant.  ? Hypertension 2000  ? Moderate mitral regurgitation   ? a. 02/2015 Echo: EF 55-60%, no rwma, mod MR, mod BAE, mildly dil RV w/ low nl RV fxn.  ? OSA on CPAP   ? Peripheral vascular disease ([place])   ? a. left SFA angioplasty in December 2016  ? Prostate cancer ([place])   ? Sleep apnea   ? wears CPAP nightly  ? Type II diabetes mellitus ([place])   ? ? ?SURGICAL HISTORY: ?Past Surgical History:  ?Procedure Laterality Date  ? ANTERIOR CERVICAL DECOMP/DISCECTOMY FUSION  08/25/2012  ? Procedure: ANTERIOR CERVICAL DECOMPRESSION/DISCECTOMY FUSION 2 LEVELS;  Surgeon: Hosie Spangle, MD;  Location: [facility];  Service: Neurosurgery;  Laterality: N/A;  Cervical five-six Cervical six-seven anterior cervical decompression with fusion and plating and bonegraft  ? BACK SURGERY    ? CARDIAC CATHETERIZATION  04/2014; 07/19/2014  ?  CORONARY ARTERY BYPASS GRAFT N/A 04/26/2014  ? Procedure: CORONARY ARTERY BYPASS GRAFTING (CABG), on pump, times four, using left internal mammary artery, right greater saphenous vein harvested endoscopically.;  Surgeon: Ivin Poot, MD;  Location: [place];  Service: Open Heart Surgery;  Laterality: N/A;  LIMA to LAD, SVG to DIAGONAL, SVG to OM1, SVG to PDA) with EVH of the RIGHT THIGH and LOWER EXTREMITY SAPHENOUS VEIN  ? Cytoscopy prostatic stone o/w nml  06/21/08  ? Dr. Jacqlyn Larsen  ? ETT myoview  09/13/09  ? Low risk EF 49%  ? High intense focused ultrasound  07/20/06  ? By Dr. Jacqlyn Larsen  ? INTRAOPERATIVE TRANSESOPHAGEAL ECHOCARDIOGRAM N/A 04/26/2014  ? Procedure: INTRAOPERATIVE TRANSESOPHAGEAL ECHOCARDIOGRAM;  Surgeon: Ivin Poot, MD;  Location: [facility];  Service: Open Heart Surgery;  Laterality: N/A;  ? PERCUTANEOUS CORONARY ROTOBLATOR INTERVENTION (PCI-R) N/A 07/22/2014  ? Procedure: PERCUTANEOUS CORONARY ROTOBLATOR INTERVENTION (PCI-R);  Surgeon: Peter M Martinique, MD;  Location: [facility] CATH LAB;  Service: Cardiovascular;  Laterality: N/A;  ? PERIPHERAL VASCULAR CATHETERIZATION Left 07/26/2015  ? Procedure: Lower Extremity Angiography;  Surgeon: Katha Cabal, MD;  Location: [facility] CV LAB;  Service: Cardiovascular;  Laterality: Left;  ? PERIPHERAL VASCULAR CATHETERIZATION  07/26/2015  ? Procedure: Lower Extremity Intervention;  Surgeon: Katha Cabal, MD;  Location: [place] CV LAB;  Service: Cardiovascular;;  ? PROSTATE BIOPSY  04/03/06 &  02/25/07  ? ? ?SOCIAL HISTORY: ?Social History  ? ?Socioeconomic History  ? Marital status: Married  ?  Spouse name: Not on file  ? Number of children: 3  ? Years of education: Not on file  ? Highest education level: Not on file  ?Occupational History  ? Occupation: Insurance account manager  ?Tobacco Use  ? Smoking status: Former  ?  Packs/day: 1.00  ?  Years: 36.00  ?  Pack years: 36.00  ?  Types: Cigarettes  ?  Quit date: 03/01/2014  ?  Years since  quitting: 7.8  ? Smokeless tobacco: Never  ?Vaping Use  ? Vaping Use: Never used  ?Substance and Sexual Activity  ? Alcohol use: Yes  ?  Comment: 07/19/2014 "might have a drink a couple times/yr"  ? Drug use: No  ? S

## 2022-01-15 ENCOUNTER — Other Ambulatory Visit: Payer: Self-pay | Admitting: Cardiovascular Disease

## 2022-02-08 ENCOUNTER — Ambulatory Visit (INDEPENDENT_AMBULATORY_CARE_PROVIDER_SITE_OTHER): Payer: Medicare Other | Admitting: Family Medicine

## 2022-02-08 ENCOUNTER — Encounter: Payer: Self-pay | Admitting: Family Medicine

## 2022-02-08 VITALS — BP 108/60 | HR 64 | Temp 97.0°F | Ht 69.0 in | Wt 245.0 lb

## 2022-02-08 DIAGNOSIS — E119 Type 2 diabetes mellitus without complications: Secondary | ICD-10-CM | POA: Diagnosis not present

## 2022-02-08 DIAGNOSIS — R300 Dysuria: Secondary | ICD-10-CM | POA: Diagnosis not present

## 2022-02-08 DIAGNOSIS — E1165 Type 2 diabetes mellitus with hyperglycemia: Secondary | ICD-10-CM | POA: Diagnosis not present

## 2022-02-08 DIAGNOSIS — I2510 Atherosclerotic heart disease of native coronary artery without angina pectoris: Secondary | ICD-10-CM

## 2022-02-08 DIAGNOSIS — R0609 Other forms of dyspnea: Secondary | ICD-10-CM | POA: Diagnosis not present

## 2022-02-08 DIAGNOSIS — E1159 Type 2 diabetes mellitus with other circulatory complications: Secondary | ICD-10-CM | POA: Diagnosis not present

## 2022-02-08 DIAGNOSIS — R202 Paresthesia of skin: Secondary | ICD-10-CM | POA: Diagnosis not present

## 2022-02-08 LAB — URINALYSIS, ROUTINE W REFLEX MICROSCOPIC
Bilirubin Urine: NEGATIVE
Ketones, ur: NEGATIVE
Leukocytes,Ua: NEGATIVE
Nitrite: NEGATIVE
Specific Gravity, Urine: 1.01 (ref 1.000–1.030)
Total Protein, Urine: NEGATIVE
Urine Glucose: >=1000 — AB
Urobilinogen, UA: 0.2 (ref 0.0–1.0)
pH: 5.5 (ref 5.0–8.0)

## 2022-02-08 LAB — CBC WITH DIFFERENTIAL/PLATELET
Basophils Absolute: 0 10*3/uL (ref 0.0–0.1)
Basophils Relative: 0.5 % (ref 0.0–3.0)
Eosinophils Absolute: 0.2 10*3/uL (ref 0.0–0.7)
Eosinophils Relative: 2.6 % (ref 0.0–5.0)
HCT: 44.9 % (ref 39.0–52.0)
Hemoglobin: 15.3 g/dL (ref 13.0–17.0)
Lymphocytes Relative: 22.5 % (ref 12.0–46.0)
Lymphs Abs: 1.5 10*3/uL (ref 0.7–4.0)
MCHC: 34.1 g/dL (ref 30.0–36.0)
MCV: 86.4 fl (ref 78.0–100.0)
Monocytes Absolute: 0.4 10*3/uL (ref 0.1–1.0)
Monocytes Relative: 6.7 % (ref 3.0–12.0)
Neutro Abs: 4.5 10*3/uL (ref 1.4–7.7)
Neutrophils Relative %: 67.7 % (ref 43.0–77.0)
Platelets: 217 10*3/uL (ref 150.0–400.0)
RBC: 5.2 Mil/uL (ref 4.22–5.81)
RDW: 13.3 % (ref 11.5–15.5)
WBC: 6.6 10*3/uL (ref 4.0–10.5)

## 2022-02-08 LAB — COMPREHENSIVE METABOLIC PANEL
ALT: 14 U/L (ref 0–53)
AST: 11 U/L (ref 0–37)
Albumin: 4.2 g/dL (ref 3.5–5.2)
Alkaline Phosphatase: 66 U/L (ref 39–117)
BUN: 24 mg/dL — ABNORMAL HIGH (ref 6–23)
CO2: 26 mEq/L (ref 19–32)
Calcium: 9.1 mg/dL (ref 8.4–10.5)
Chloride: 102 mEq/L (ref 96–112)
Creatinine, Ser: 1.15 mg/dL (ref 0.40–1.50)
GFR: 64.44 mL/min (ref >60.00)
Glucose, Bld: 302 mg/dL — ABNORMAL HIGH (ref 70–99)
Potassium: 4.8 mEq/L (ref 3.5–5.1)
Sodium: 136 mEq/L (ref 135–145)
Total Bilirubin: 0.7 mg/dL (ref 0.2–1.2)
Total Protein: 6.4 g/dL (ref 6.0–8.3)

## 2022-02-08 LAB — BRAIN NATRIURETIC PEPTIDE: Pro B Natriuretic peptide (BNP): 62 pg/mL (ref 0.0–100.0)

## 2022-02-08 LAB — TSH: TSH: 1.67 u[IU]/mL (ref 0.35–5.50)

## 2022-02-08 LAB — HEMOGLOBIN A1C: Hgb A1c MFr Bld: 9.8 % — ABNORMAL HIGH (ref 4.6–6.5)

## 2022-02-08 LAB — VITAMIN B12: Vitamin B-12: 511 pg/mL (ref 211–911)

## 2022-02-08 MED ORDER — EZETIMIBE 10 MG PO TABS
10.0000 mg | ORAL_TABLET | Freq: Every day | ORAL | 3 refills | Status: AC
Start: 2022-02-08 — End: ?

## 2022-02-08 MED ORDER — NITROGLYCERIN 0.4 MG SL SUBL: 0.4000 mg | SUBLINGUAL_TABLET | SUBLINGUAL | 2 refills | Status: AC | PRN

## 2022-02-08 NOTE — Patient Instructions (Addendum)
Restart zetia in the meantime.   Recheck fasting labs in about 2 months.    Go to the lab on the way out.   If you have mychart we'll likely use that to update you.    I'll update cardiology in the meantime.   If you have more chest pain or tightness then go to the ER.  Limit exertion in the meantime.    Max 3 doses of NTG in 15 minutes if needed for chest pain.  If you use it, then go to the ER.   Take care.  Glad to see you.

## 2022-02-08 NOTE — Progress Notes (Signed)
He has been off zetia, d/w pt about recheck lipids, rx sent today.  No ADE on med prev.    Discussed with patient that I will check with Dr. Rogue Bussing about checking lipids.  Order is in EMR.  H/o low Na on lasix at baseline.  D/w pt.    Fatigue noted.  "I'm not 40 anymore."  Dec stamina.  Bendopathy noted.  Sleeping on 2 pillows.  Not SOB supine, still using CPAP.  No BLE edema.  Chest tightness with walking/exertion after about 100 steps.    Inc urinary frequency over the last few years.  H/o prostate cancer. Frequency with near incontinence but not burning with urination.    Indigestion noted after meals for the last few months.  Irregular BMs.  On trulicity.  Overall some better recently.  He had noted occ blood in stools, red blood in the toilet x2 in the few months.    Colonoscopy 2022 with  - Two diminutive polyps in the transverse colon and in the cecum, removed with a cold snare. Resected and retrieved. - The examination was otherwise normal on direct and retroflexion views.  Balance changes with dec sensation in the BLE.  H/o DM2 per endocrine.    Meds, vitals, and allergies reviewed.   ROS: Per HPI unless specifically indicated in ROS section   GEN: nad, alert and oriented HEENT:ncat NECK: supple w/o LA CV: rrr. PULM: ctab, no inc wob ABD: soft, +bs EXT: no edema SKIN: no acute rash  34 minutes were devoted to patient care in this encounter (this includes time spent reviewing the patient's file/history, interviewing and examining the patient, counseling/reviewing plan with patient).

## 2022-02-09 ENCOUNTER — Encounter: Payer: Self-pay | Admitting: Cardiology

## 2022-02-09 ENCOUNTER — Ambulatory Visit (INDEPENDENT_AMBULATORY_CARE_PROVIDER_SITE_OTHER): Payer: Medicare Other | Admitting: Cardiology

## 2022-02-09 VITALS — BP 110/58 | HR 68 | Ht 69.0 in | Wt 246.2 lb

## 2022-02-09 DIAGNOSIS — R0609 Other forms of dyspnea: Secondary | ICD-10-CM

## 2022-02-09 DIAGNOSIS — R072 Precordial pain: Secondary | ICD-10-CM | POA: Diagnosis not present

## 2022-02-09 DIAGNOSIS — I10 Essential (primary) hypertension: Secondary | ICD-10-CM

## 2022-02-09 MED ORDER — ISOSORBIDE MONONITRATE ER 30 MG PO TB24: 15.0000 mg | ORAL_TABLET | Freq: Every day | ORAL | 3 refills | Status: AC

## 2022-02-09 MED ORDER — FUROSEMIDE 20 MG PO TABS: 20.0000 mg | ORAL_TABLET | ORAL | 1 refills | Status: AC | PRN

## 2022-02-09 NOTE — Progress Notes (Signed)
Cardiology Office Note:    Date:  02/09/2022   ID:  Phillip Clements, DOB 1950-09-19, MRN [id number]  PCP:  Tonia Ghent, MD   [place] Providers Cardiologist:  [name], MD     Referring MD: Tonia Ghent, MD   Chief Complaint  Patient presents with   Other    C/o burning sensation with exertion feels like indigestion. Meds reviewed verbally with pt.    History of Present Illness:    Phillip Clements is a 71 y.o. male with a hx of CAD/CABG, s/p PCI to left main-LAD, PAD s/p left SFA angioplasty 2016, former smoker x40+ years, OSA on CPAP, diabetes presenting with chest pain.  Patient complains of burning chest and neck pain associated with exertion.  Also endorses shortness of breath with exertion.  Symptoms are relieved with rest.  Symptoms have been ongoing over the past several weeks.  Denies edema.  Has a history of prostate cancer, has developed significant frequent urination of late.  Denies edema, takes Lasix daily 20 mg.  States blood pressures have been running a little low at home, systolic 381.  Also endorses fatigue and lack of stamina.  Previous echo 2021 showed normal EF at 01-75, normal diastolic function.    Past Medical History:  Diagnosis Date   Cataract    bilateral eye in 2010   Coronary artery disease    a. CABG 04/2014: (LIMA->LAD, VG->DIAG, VG->OM2, VG->PDA)  b. 07/2014 early graft failure (VG->OM2 60, LIMA->LAD atretic, VG->PDA & VG->D1 100). s/p successful rotational atherectomy & DES of the LM & pLAD (3.0x24 Promus DES) & mLAD (2.25x24 Promus DES).   CVA (cerebral infarction)    Family history of prostate cancer    Hx of adenomatous colonic polyps 06/17/2017   Hyperlipidemia    a. Statin intolerant.   Hypertension 2000   Moderate mitral regurgitation    a. 02/2015 Echo: EF 55-60%, no rwma, mod MR, mod BAE, mildly dil RV w/ low nl RV fxn.   OSA on CPAP    Peripheral vascular disease ([place])    a. left SFA angioplasty in  December 2016   Prostate cancer [facility])    Sleep apnea    wears CPAP nightly   Type II diabetes mellitus [facility])     Past Surgical History:  Procedure Laterality Date   ANTERIOR CERVICAL DECOMP/DISCECTOMY FUSION  08/25/2012   Procedure: ANTERIOR CERVICAL DECOMPRESSION/DISCECTOMY FUSION 2 LEVELS;  Surgeon: Hosie Spangle, MD;  Location: [place] NEURO ORS;  Service: Neurosurgery;  Laterality: N/A;  Cervical five-six Cervical six-seven anterior cervical decompression with fusion and plating and bonegraft   BACK SURGERY     CARDIAC CATHETERIZATION  04/2014; 07/19/2014   CORONARY ARTERY BYPASS GRAFT N/A 04/26/2014   Procedure: CORONARY ARTERY BYPASS GRAFTING (CABG), on pump, times four, using left internal mammary artery, right greater saphenous vein harvested endoscopically.;  Surgeon: Ivin Poot, MD;  Location: [name];  Service: Open Heart Surgery;  Laterality: N/A;  LIMA to LAD, SVG to DIAGONAL, SVG to OM1, SVG to PDA) with EVH of the RIGHT THIGH and LOWER EXTREMITY SAPHENOUS VEIN   Cytoscopy prostatic stone o/w nml  06/21/08   Dr. Jacqlyn Larsen   ETT myoview  09/13/09   Low risk EF 49%   High intense focused ultrasound  07/20/06   By Dr. Jacqlyn Larsen   INTRAOPERATIVE TRANSESOPHAGEAL ECHOCARDIOGRAM N/A 04/26/2014   Procedure: INTRAOPERATIVE TRANSESOPHAGEAL ECHOCARDIOGRAM;  Surgeon: Ivin Poot, MD;  Location: [place];  Service: Open  Heart Surgery;  Laterality: N/A;   PERCUTANEOUS CORONARY ROTOBLATOR INTERVENTION (PCI-R) N/A 07/22/2014   Procedure: PERCUTANEOUS CORONARY ROTOBLATOR INTERVENTION (PCI-R);  Surgeon: Peter M Martinique, MD;  Location: [facility] CATH LAB;  Service: Cardiovascular;  Laterality: N/A;   PERIPHERAL VASCULAR CATHETERIZATION Left 07/26/2015   Procedure: Lower Extremity Angiography;  Surgeon: Katha Cabal, MD;  Location: [place] CV LAB;  Service: Cardiovascular;  Laterality: Left;   PERIPHERAL VASCULAR CATHETERIZATION  07/26/2015   Procedure: Lower Extremity Intervention;  Surgeon: Katha Cabal, MD;  Location: [place] CV LAB;  Service: Cardiovascular;;   PROSTATE BIOPSY  04/03/06 & 02/25/07    Current Medications: Current Meds  Medication Sig   aspirin EC 81 MG tablet Take 1 tablet (81 mg total) by mouth daily.   clopidogrel (PLAVIX) 75 MG tablet TAKE 1 TABLET BY MOUTH ONCE DAILY   Dulaglutide (TRULICITY) 1.5 ST/4.1DQ SOPN Inject 1.5 mg into the skin once a week.   glucose blood (ONETOUCH ULTRA) test strip CHECK BLOOD SUGAR TWICE A DAY - DX E11.59   insulin degludec (TRESIBA FLEXTOUCH) 200 UNIT/ML FlexTouch Pen Inject 100 Units into the skin daily.   Insulin Pen Needle 32G X 4 MM MISC Use 1x a day   INVOKANA 300 MG TABS tablet TAKE 1 TABLET BY MOUTH ONCE A DAY WITH BREAKFAST   isosorbide mononitrate (IMDUR) 30 MG 24 hr tablet Take 0.5 tablets (15 mg total) by mouth daily.   losartan (COZAAR) 25 MG tablet TAKE 1 TABLET BY MOUTH ONCE A DAY   meclizine (ANTIVERT) 25 MG tablet Take 0.5-1 tablets (12.5-25 mg total) by mouth 2 (two) times daily as needed for dizziness or nausea.   MYRBETRIQ 50 MG TB24 tablet TAKE 1 TABLET BY MOUTH ONCE A DAY   nitroGLYCERIN (NITROSTAT) 0.4 MG SL tablet Place 1 tablet (0.4 mg total) under the tongue as needed.   REPATHA SURECLICK 222 MG/ML SOAJ INJECT 1 PEN INTO THE SKIN EVERY 14 DAYS   tamsulosin (FLOMAX) 0.4 MG CAPS capsule TAKE 1 CAPSULE BY MOUTH ONCE DAILY AFTERSUPPER   [DISCONTINUED] carvedilol (COREG) 3.125 MG tablet Take 3.125 mg by mouth 2 (two) times daily.   [DISCONTINUED] furosemide (LASIX) 20 MG tablet TAKE 1 TABLET BY MOUTH ONCE A DAY     Allergies:   Coreg [carvedilol], Metformin and related, Protonix [pantoprazole sodium], Rosuvastatin calcium, and Statins   Social History   Socioeconomic History   Marital status: Married    Spouse name: Not on file   Number of children: 3   Years of education: Not on file   Highest education level: Not on file  Occupational History   Occupation: Scientist, research (physical sciences) and Distribution  Center/Sales  Tobacco Use   Smoking status: Former    Packs/day: 1.00    Years: 36.00    Total pack years: 36.00    Types: Cigarettes    Quit date: 03/01/2014    Years since quitting: 7.9   Smokeless tobacco: Never  Vaping Use   Vaping Use: Never used  Substance and Sexual Activity   Alcohol use: Yes    Comment: 07/19/2014 "might have a drink a couple times/yr"   Drug use: No   Sexual activity: Not Currently  Other Topics Concern   Not on file  Social History Narrative   Lives with wife.; 2 daughters and 1 son.[employer] fan; semi-retd; Arboriculturist; Former Education officer, community, 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 In [place].  Social Determinants of Health   Financial Resource Strain: Not on file  Food Insecurity: Not on file  Transportation Needs: Not on file  Physical Activity: Not on file  Stress: Not on file  Social Connections: Not on file     Family History: The patient's family history includes Coronary artery disease in his father and mother; Diabetes in his brother, father, and sister; Diabetes (age of onset: 11) in his mother; Heart attack in his brother, father, maternal grandfather, and mother; Heart disease in his brother, father, and mother; Hypertension in his father and mother; Prostate cancer (age of onset: 86) in his paternal grandfather. There is no history of Breast cancer, Colon cancer, Depression, Alcohol abuse, or Stroke.  ROS:   Please see the history of present illness.     All other systems reviewed and are negative.  EKGs/Labs/Other Studies Reviewed:    The following studies were reviewed today:   EKG:  EKG is  ordered today.  The ekg ordered today demonstrates sinus rhythm, possible old septal infarct  Recent Labs: 02/08/2022: ALT 14; BUN 24; Creatinine, Ser 1.15; Hemoglobin 15.3; Platelets 217.0; Potassium 4.8; Pro B Natriuretic peptide (BNP) 62.0; Sodium 136; TSH 1.67  Recent Lipid  Panel    Component Value Date/Time   CHOL 166 10/09/2019 0952   CHOL 97 (L) 09/24/2016 0855   TRIG 138 10/09/2019 0952   HDL 42 10/09/2019 0952   HDL 38 (L) 09/24/2016 0855   CHOLHDL 4.0 10/09/2019 0952   VLDL 28 10/09/2019 0952   LDLCALC 96 10/09/2019 0952   LDLCALC 37 09/24/2016 0855   LDLDIRECT 157.1 09/07/2013 0910     Risk Assessment/Calculations:         Physical Exam:    VS:  BP (!) 110/58 (BP Location: Left Arm, Patient Position: Sitting, Cuff Size: Normal)   Pulse 68   Ht '5\' 9"'$  (1.753 m)   Wt 246 lb 4 oz (111.7 kg)   SpO2 98%   BMI 36.36 kg/m     Wt Readings from Last 3 Encounters:  02/09/22 246 lb 4 oz (111.7 kg)  02/08/22 245 lb (111.1 kg)  12/25/21 246 lb 12.8 oz (111.9 kg)     GEN:  Well nourished, well developed in no acute distress HEENT: Normal NECK: No JVD; No carotid bruits LYMPHATICS: No lymphadenopathy CARDIAC: RRR, no murmurs, rubs, gallops RESPIRATORY: Diminished breath sounds at bases, no wheezing ABDOMEN: Soft, non-tender, non-distended MUSCULOSKELETAL:  No edema; No deformity  SKIN: Warm and dry NEUROLOGIC:  Alert and oriented x 3 PSYCHIATRIC:  Normal affect   ASSESSMENT:    1. Precordial pain   2. Dyspnea on exertion   3. Primary hypertension    PLAN:    In order of problems listed above:  Chest pain, history of CAD, CABG, PCI.  Patient is high risk due to multiple PCIs, cardiac.  Obtain Lexiscan Myoview, start Imdur 15 mg daily.  Continue aspirin, Plavix, Zetia.  Repatha. Dyspnea on exertion, get echo.  Myoview as above.  Previous smoker, referred to pulmonary medicine for PFTs, evaluate for emphysema.  Okay to take Lasix as needed due to frequent urination. Hypertension, BP low normal, patient complains of fatigue.  Stop Coreg, continue losartan as patient is diabetic.  Imdur added for antianginal benefit Frequent urination, advised to follow-up with PCP and urology regarding this.  Okay to take Lasix as needed for weight gain  and edema.  Previous echo shows normal systolic and diastolic function.  Follow-up after cardiac testing with  APP or Dr. Fletcher Anon      Shared Decision Making/Informed Consent The risks [chest pain, shortness of breath, cardiac arrhythmias, dizziness, blood pressure fluctuations, myocardial infarction, stroke/transient ischemic attack, nausea, vomiting, allergic reaction, radiation exposure, metallic taste sensation and life-threatening complications (estimated to be 1 in 10,000)], benefits (risk stratification, diagnosing coronary artery disease, treatment guidance) and alternatives of a nuclear stress test were discussed in detail with Mr. Highfill and he agrees to proceed.    Medication Adjustments/Labs and Tests Ordered: Current medicines are reviewed at length with the patient today.  Concerns regarding medicines are outlined above.  Orders Placed This Encounter  Procedures   NM Myocar Multi W/Spect W/Wall Motion / EF   Ambulatory referral to Pulmonology   EKG 12-Lead   ECHOCARDIOGRAM COMPLETE   Meds ordered this encounter  Medications   isosorbide mononitrate (IMDUR) 30 MG 24 hr tablet    Sig: Take 0.5 tablets (15 mg total) by mouth daily.    Dispense:  45 tablet    Refill:  3   furosemide (LASIX) 20 MG tablet    Sig: Take 1 tablet (20 mg total) by mouth as needed.    Dispense:  90 tablet    Refill:  1    No need to refill at this time. Dose changed to PRN    Patient Instructions  Medication Instructions:   Your physician has recommended you make the following change in your medication:    STOP taking your Carvedilol (Coreg).  2.    ONLY take your Furosemide as needed for swelling or shortness of breath.  3.    START taking Isosorbide (Imdur) 15 MG once a day.  *If you need a refill on your cardiac medications before your next appointment, please call your pharmacy*   Testing/Procedures:  Your physician has requested that you have an echocardiogram. Echocardiography  is a painless test that uses sound waves to create images of your heart. It provides your doctor with information about the size and shape of your heart and how well your heart's chambers and valves are working. This procedure takes approximately one hour. There are no restrictions for this procedure.   2.   Your caregiver has ordered a Stress Test with nuclear imaging. The purpose of this test is to evaluate the blood supply to your heart muscle. This procedure is referred to as a "Non-Invasive Stress Test." This is because other than having an IV started in your vein, nothing is inserted or "invades" your body. Cardiac stress tests are done to find areas of poor blood flow to the heart by determining the extent of coronary artery disease (CAD). Some patients exercise on a treadmill, which naturally increases the blood flow to your heart, while others who are  unable to walk on a treadmill due to physical limitations have a pharmacologic/chemical stress agent called Lexiscan . This medicine will mimic walking on a treadmill by temporarily increasing your coronary blood flow.      PLEASE REPORT TO [facility] MEDICAL MALL ENTRANCE   THE VOLUNTEERS AT THE FIRST DESK WILL DIRECT YOU WHERE TO GO     *Please note: these test may take anywhere between 2-4 hours to complete       Date of Procedure:_____________________________________   Arrival Time for Procedure:______________________________    PLEASE NOTIFY THE OFFICE AT LEAST 24 HOURS IN ADVANCE IF YOU ARE UNABLE TO KEEP YOUR APPOINTMENT.  [place] 24 HOURS IN ADVANCE IF  YOU ARE UNABLE TO KEEP YOUR APPOINTMENT. [phone]    How to prepare for your Myoview test:         __XX__:  Hold diabetes medication the morning of procedure:  Insulin   __XX__:  Hold other medications as follows: MYRBETRIQ     1. Do not eat or drink after midnight  2. No caffeine for 24 hours prior to test  3. No  smoking 24 hours prior to test.  4. Unless instructed otherwise, Take your medication with a small sips of water.    5.         Ladies, please do not wear dresses. Skirts or pants are appropriate. Please wear a short sleeve shirt.  6. No perfume, cologne or lotion.  7. Wear comfortable walking shoes. No heels!    Follow-Up: At [facility], you and your health needs are our priority.  As part of our continuing mission to provide you with exceptional heart care, we have created designated Provider Care Teams.  These Care Teams include your primary Cardiologist (physician) and Advanced Practice Providers (APPs -  Physician Assistants and Nurse Practitioners) who all work together to provide you with the care you need, when you need it.  We recommend signing up for the patient portal called "MyChart".  Sign up information is provided on this After Visit Summary.  MyChart is used to connect with patients for Virtual Visits (Telemedicine).  Patients are able to view lab/test results, encounter notes, upcoming appointments, etc.  Non-urgent messages can be sent to your provider as well.   To learn more about what you can do with MyChart, go to [email or web address].    Your next appointment:   Follow up after testing   The format for your next appointment:   In Person  Provider:   You may see [name], MD or one of the following Advanced Practice Providers on your designated Care Team:   Murray Hodgkins, NP Christell Faith, PA-C Cadence Kathlen Mody, [place]    Other Instructions   Important Information About Sugar         Signed, Kate Sable, MD  02/09/2022 3:41 PM    [place]

## 2022-02-09 NOTE — Patient Instructions (Signed)
Medication Instructions:   Your physician has recommended you make the following change in your medication:    STOP taking your Carvedilol (Coreg).  2.    ONLY take your Furosemide as needed for swelling or shortness of breath.  3.    START taking Isosorbide (Imdur) 15 MG once a day.  *If you need a refill on your cardiac medications before your next appointment, please call your pharmacy*   Testing/Procedures:  Your physician has requested that you have an echocardiogram. Echocardiography is a painless test that uses sound waves to create images of your heart. It provides your doctor with information about the size and shape of your heart and how well your heart's chambers and valves are working. This procedure takes approximately one hour. There are no restrictions for this procedure.   2.   Your caregiver has ordered a Stress Test with nuclear imaging. The purpose of this test is to evaluate the blood supply to your heart muscle. This procedure is referred to as a "Non-Invasive Stress Test." This is because other than having an IV started in your vein, nothing is inserted or "invades" your body. Cardiac stress tests are done to find areas of poor blood flow to the heart by determining the extent of coronary artery disease (CAD). Some patients exercise on a treadmill, which naturally increases the blood flow to your heart, while others who are  unable to walk on a treadmill due to physical limitations have a pharmacologic/chemical stress agent called Lexiscan . This medicine will mimic walking on a treadmill by temporarily increasing your coronary blood flow.      PLEASE REPORT TO [facility] MEDICAL MALL ENTRANCE   THE VOLUNTEERS AT THE FIRST DESK WILL DIRECT YOU WHERE TO GO     *Please note: these test may take anywhere between 2-4 hours to complete       Date of Procedure:_____________________________________   Arrival Time for Procedure:______________________________    PLEASE NOTIFY  THE OFFICE AT LEAST 24 HOURS IN ADVANCE IF YOU ARE UNABLE TO KEEP YOUR APPOINTMENT.  [place] 24 HOURS IN ADVANCE IF YOU ARE UNABLE TO KEEP YOUR APPOINTMENT. [phone]    How to prepare for your Myoview test:         __XX__:  Hold diabetes medication the morning of procedure:  Insulin   __XX__:  Hold other medications as follows: MYRBETRIQ     1. Do not eat or drink after midnight  2. No caffeine for 24 hours prior to test  3. No smoking 24 hours prior to test.  4. Unless instructed otherwise, Take your medication with a small sips of water.    5.         Ladies, please do not wear dresses. Skirts or pants are appropriate. Please wear a short sleeve shirt.  6. No perfume, cologne or lotion.  7. Wear comfortable walking shoes. No heels!    Follow-Up: At [facility], you and your health needs are our priority.  As part of our continuing mission to provide you with exceptional heart care, we have created designated Provider Care Teams.  These Care Teams include your primary Cardiologist (physician) and Advanced Practice Providers (APPs -  Physician Assistants and Nurse Practitioners) who all work together to provide you with the care you need, when you need it.  We recommend signing up for the patient portal called "MyChart".  Sign up information is provided on this After Visit Summary.  MyChart is used to connect with patients for Virtual Visits (Telemedicine).  Patients are able to view lab/test results, encounter notes, upcoming appointments, etc.  Non-urgent messages can be sent to your provider as well.   To learn more about what you can do with MyChart, go to [email or web address].    Your next appointment:   Follow up after testing   The format for your next appointment:   In Person  Provider:   You may see [name], MD or one of the following Advanced Practice Providers on your designated Care Team:    Murray Hodgkins, NP Christell Faith, PA-C Cadence Kathlen Mody, [place]    Other Instructions   Important Information About Sugar

## 2022-02-11 ENCOUNTER — Other Ambulatory Visit: Payer: Self-pay | Admitting: Family Medicine

## 2022-02-11 LAB — URINE CULTURE
MICRO NUMBER:: 13589085
SPECIMEN QUALITY:: ADEQUATE

## 2022-02-11 MED ORDER — SULFAMETHOXAZOLE-TRIMETHOPRIM 800-160 MG PO TABS: 1.0000 | ORAL_TABLET | Freq: Two times a day (BID) | ORAL | 0 refills | Status: DC

## 2022-02-11 NOTE — Assessment & Plan Note (Signed)
Some of his indigestion could be due to Trulicity but he has improved some recently.  He had occasional blood in his stools (twice in the last few months) but he had a reassuring recent colonoscopy and I do not suspect an ominous source.  I do not suspect that the indigestion and the blood in his stools are related unless via constipation.  In the meantime I do not think it makes sense to stop Trulicity or try to address a repeat colonoscopy given his cardiac history and his recent symptoms.  He has a history of decree sensation in the feet due to likely neuropathy.  I presume this is related to diabetes but see notes on labs.

## 2022-02-11 NOTE — Assessment & Plan Note (Signed)
I will check with hematology about getting a lipid drawn with his next set of labs there.  Restart Zetia in the meantime.  I will update cardiology regarding his situation and his recent symptoms.  See notes on labs today.  Nitroglycerin prescription given to patient with routine cautions.  See after visit summary.

## 2022-02-12 ENCOUNTER — Telehealth: Payer: Self-pay

## 2022-02-12 NOTE — Telephone Encounter (Signed)
Wife notified. Put note in appt desk to check lipid next visit.

## 2022-02-12 NOTE — Telephone Encounter (Signed)
-----   Message from Cammie Sickle, MD sent at 02/12/2022  2:05 PM EDT ----- Dr.Duncan-we can his lipid profile at next visit.  H/A-please inform patient that lipid profile will be checked at next visit.  Please have him come fasting for the blood work..  Thanks GB ----- Message ----- From: Tonia Ghent, MD Sent: 02/11/2022  10:09 PM EDT To: Cammie Sickle, MD  Is it possible to get a lipid panel collected with his next set of labs at your office?  He just restarted Zetia and I put in the follow-up order.  Many thanks.  Brigitte Pulse

## 2022-02-15 ENCOUNTER — Telehealth: Payer: Self-pay

## 2022-02-15 DIAGNOSIS — I2510 Atherosclerotic heart disease of native coronary artery without angina pectoris: Secondary | ICD-10-CM

## 2022-02-15 NOTE — Telephone Encounter (Signed)
Dr. Damita Dunnings request patient have fasting lipid panel at his next lab visit at [facility].  Message left for patient informing him of Dr. Josefine Class request and to come fasting for the lab appt on 03/27/22

## 2022-02-19 ENCOUNTER — Encounter
Admission: RE | Admit: 2022-02-19 | Discharge: 2022-02-19 | Disposition: A | Discharge status: Home / Self Care | Payer: Medicare Other | Source: Ambulatory Visit | Attending: Cardiology | Admitting: Cardiology

## 2022-02-19 DIAGNOSIS — R072 Precordial pain: Secondary | ICD-10-CM | POA: Diagnosis not present

## 2022-02-19 LAB — NM MYOCAR MULTI W/SPECT W/WALL MOTION / EF
LV dias vol: 128 mL (ref 62–150)
LV sys vol: 48 mL
Nuc Stress EF: 63 %
Peak HR: 77 {beats}/min
Rest HR: 56 {beats}/min
Rest Nuclear Isotope Dose: 10 mCi
SDS: 2
SRS: 8
SSS: 9
ST Depression (mm): 0 mm
Stress Nuclear Isotope Dose: 29.5 mCi
TID: 1.24

## 2022-02-19 MED ORDER — TECHNETIUM TC 99M TETROFOSMIN IV KIT
29.5400 | PACK | Freq: Once | INTRAVENOUS | Status: AC | PRN
  Administered 2022-02-19: 29.54 via INTRAVENOUS

## 2022-02-19 MED ORDER — TECHNETIUM TC 99M TETROFOSMIN IV KIT
10.0000 | PACK | Freq: Once | INTRAVENOUS | Status: AC | PRN
  Administered 2022-02-19: 10.02 via INTRAVENOUS

## 2022-02-19 MED ORDER — REGADENOSON 0.4 MG/5ML IV SOLN
0.4000 mg | Freq: Once | INTRAVENOUS | Status: AC
Start: 2022-02-19 — End: 2022-02-19
  Administered 2022-02-19: 0.4 mg via INTRAVENOUS
  Filled 2022-02-19: qty 5

## 2022-02-20 ENCOUNTER — Telehealth: Payer: Self-pay

## 2022-02-20 NOTE — Telephone Encounter (Signed)
-----   Message from Kate Sable, MD sent at 02/20/2022  9:17 AM EDT ----- Stress test showed old infarct with some peri-infarct ischemia.  Please advise patient to continue Imdur as prescribed for antianginal benefit.  Schedule follow-up appointment with Dr. Fletcher Anon for left heart cath consideration.

## 2022-02-20 NOTE — Telephone Encounter (Signed)
Called patient and left a detailed VM for patient per DPR on file. I also informed him that I scheduled him for our soonest available appointment with  Cadence Kathlen Mody on August 25th. I requested that he call us back or send [place] a MyChart to let us know if he can make this appointment.

## 2022-02-20 NOTE — Telephone Encounter (Signed)
Pt's wife stating they received the message and they will be there on 08/25.

## 2022-02-20 NOTE — Telephone Encounter (Signed)
Noted  

## 2022-02-26 ENCOUNTER — Other Ambulatory Visit: Payer: Self-pay | Admitting: Cardiovascular Disease

## 2022-02-27 ENCOUNTER — Telehealth: Payer: Self-pay | Admitting: Family Medicine

## 2022-02-27 NOTE — Telephone Encounter (Signed)
Left message for patient to call back and schedule Medicare Annual Wellness Visit (AWV) either virtually or phone   Last AWV 11/28/18   I left my direct # [phone]

## 2022-03-01 ENCOUNTER — Encounter: Payer: Self-pay | Admitting: Internal Medicine

## 2022-03-01 ENCOUNTER — Ambulatory Visit (INDEPENDENT_AMBULATORY_CARE_PROVIDER_SITE_OTHER): Payer: Medicare Other | Admitting: Internal Medicine

## 2022-03-01 VITALS — BP 128/68 | HR 68 | Ht 69.0 in | Wt 249.8 lb

## 2022-03-01 DIAGNOSIS — E1165 Type 2 diabetes mellitus with hyperglycemia: Secondary | ICD-10-CM

## 2022-03-01 DIAGNOSIS — E1159 Type 2 diabetes mellitus with other circulatory complications: Secondary | ICD-10-CM

## 2022-03-01 DIAGNOSIS — E785 Hyperlipidemia, unspecified: Secondary | ICD-10-CM | POA: Diagnosis not present

## 2022-03-01 DIAGNOSIS — M791 Myalgia, unspecified site: Secondary | ICD-10-CM | POA: Diagnosis not present

## 2022-03-01 DIAGNOSIS — T466X5A Adverse effect of antihyperlipidemic and antiarteriosclerotic drugs, initial encounter: Secondary | ICD-10-CM | POA: Diagnosis not present

## 2022-03-01 DIAGNOSIS — Z6839 Body mass index (BMI) 39.0-39.9, adult: Secondary | ICD-10-CM

## 2022-03-01 DIAGNOSIS — I2510 Atherosclerotic heart disease of native coronary artery without angina pectoris: Secondary | ICD-10-CM | POA: Diagnosis not present

## 2022-03-01 NOTE — Patient Instructions (Addendum)
Please continue: - Invokana  300 mg bfore b'fast - Trulicity 1.5 mg weekly - Tresiba 100 units daily   Change: - NovoLog: 20-24 units 15 min before brunch 30-38 units 15 min before dinner  The most common suppliers for the  [facility] continuous glucose monitor (CGM) are: [place] [employer]: [place]: [phone] Ext [phone] [employer]: [place]: [place]: [phone] [employer]: [phone]  Reduce the dinner size and try to move it earlier in the day.  Please return in 3-4 months with your sugar log/CGM.

## 2022-03-01 NOTE — Progress Notes (Signed)
Patient ID: HERNY SCURLOCK, male   DOB: 1951-02-22, 71 y.o.   MRN: [id number]  HPI: Phillip Clements is a 71 y.o.-year-old male, returning for f/u for DM2, dx in 1996, insulin-dependent since 2008, uncontrolled, with complications (CAD - s/p stents, CABG, cerebro-vascular ds - s/p CVA, PVD). Last visit 8 months ago.  He is here with his wife who offers part of the history especially related to his medications, blood sugars, and past medical history. He changed to [facility] in 10/2016.  Interim history: No blurry vision, nausea, chest pain. He had a UTI - was on ABx - last month.  Reviewed HbA1c levels: Lab Results  Component Value Date   HGBA1C 9.8 (H) 02/08/2022   HGBA1C 8.5 (A) 06/22/2021   HGBA1C 9.8 (A) 02/28/2021   Pt was on a regimen of: - Invokana 300 mg before b'fast - Trulicity 1.5 mg weekly - Novolog 40 units 3x a day - 15 min before meals - Toujeo 120  units (60 x2) after dinner He tried Victoza and Januvia. Had GI intolerance (nausea, diarrhea) to regular Metformin and also with metformin ER >>stopped.  He was then on: - Invokana 300 mg before breakfast - Trulicity 1.5 mg weekly - U500 insulin 55-70 units 3 times a day before meals Also, if sugars in the morning are: - 160-200, take 10 units - 201-240, take 15 units - >240, take 20 units  He is currently on: - Invokana 300 mg b'fore b'fast - Trulicity 1.5 >> 3  mg weekly  - stopped 2/2 diarrhea, nausea >>  1.5 mg weekly - missed 1 dose - Tresiba 32 >> 44 units daily-started 10/2018 >> 66 >> 86 >> 100 units daily - NovoLog 16 to 24 units before meals (actually taking 20 units only) >> 16-24 units before meals (forgets it)  He is checking sugars once a day: - am: [id number], 226 >> 168-263 >> 123-210 >> 105-283, 359 >> 98-333 - 2h after b'fast: n/c  - before lunch: 100-120 >> n/c >> 126-166 >> n/c >> 133-178 >> 100-290 - 2h after lunch: n/c >> 170 - before dinner: 110-169, 191 >> 120-311 (snack) >> 113-189 >> 82,  97 - 2h after dinner: 280 >> 224-269 >> 297, 367 >> 180s >>  n/c - bedtime: 201, 214 >> 128-150 >> n/c>> 116-307 >> 167-HI - nighttime: n/c 196-317 >> n/c Lowest sugar was 56 >> ... 120 >> 82; he has hypoglycemia awareness in the 70s. Highest sugar was  445 >> .Marland Kitchen.359 >> 509, HI  Glucometer: OneTouch Ultra mini  Pt's meals are mostly plant-based: - Breakfast:  Skips >> PB and apple - Lunch: sandwich, chips, cottage cheese, pineapple >> skips - Dinner: 2 veggies, salads; Sat night: meat >> grilled chicken and salad - Snacks: 2/day: celery + PB; low salt triscuit + laughing cow cheese  He continues to walk on the treadmill 2 out of 7 days at the gym and working outside.  + Mild CKD, last BUN/creatinine:  Lab Results  Component Value Date   BUN 24 (H) 02/08/2022   CREATININE 1.15 02/08/2022  On losartan. -+ HL: Lab Results  Component Value Date   CHOL 166 10/09/2019   HDL 42 10/09/2019   LDLCALC 96 10/09/2019   LDLDIRECT 157.1 09/07/2013   TRIG 138 10/09/2019   CHOLHDL 4.0 10/09/2019  He could not tolerate statins due to joint pains.  He continues on Repatha and Zetia.  - last eye exam was in 05/2020: No DR reportedly; + history  of cataract surgery. [place].  - no Numbness and tingling in his feet.   He has prostate cancer, believed to be metastatic to the bones.  On Lupron.  He finished radiation therapy.  ROS: + see HPI + fatigue  I reviewed pt's medications, allergies, PMH, social hx, family hx, and changes were documented in the history of present illness. Otherwise, unchanged from my initial visit note.  Past Medical History:  Diagnosis Date   Cataract    bilateral eye in 2010   Coronary artery disease    a. CABG 04/2014: (LIMA->LAD, VG->DIAG, VG->OM2, VG->PDA)  b. 07/2014 early graft failure (VG->OM2 60, LIMA->LAD atretic, VG->PDA & VG->D1 100). s/p successful rotational atherectomy & DES of the LM & pLAD (3.0x24 Promus DES) & mLAD (2.25x24 Promus DES).    CVA (cerebral infarction)    Family history of prostate cancer    Hx of adenomatous colonic polyps 06/17/2017   Hyperlipidemia    a. Statin intolerant.   Hypertension 2000   Moderate mitral regurgitation    a. 02/2015 Echo: EF 55-60%, no rwma, mod MR, mod BAE, mildly dil RV w/ low nl RV fxn.   OSA on CPAP    Peripheral vascular disease ([place])    a. left SFA angioplasty in December 2016   Prostate cancer [facility])    Sleep apnea    wears CPAP nightly   Type II diabetes mellitus [facility])    Past Surgical History:  Procedure Laterality Date   ANTERIOR CERVICAL DECOMP/DISCECTOMY FUSION  08/25/2012   Procedure: ANTERIOR CERVICAL DECOMPRESSION/DISCECTOMY FUSION 2 LEVELS;  Surgeon: Hosie Spangle, MD;  Location: [place] NEURO ORS;  Service: Neurosurgery;  Laterality: N/A;  Cervical five-six Cervical six-seven anterior cervical decompression with fusion and plating and bonegraft   BACK SURGERY     CARDIAC CATHETERIZATION  04/2014; 07/19/2014   CORONARY ARTERY BYPASS GRAFT N/A 04/26/2014   Procedure: CORONARY ARTERY BYPASS GRAFTING (CABG), on pump, times four, using left internal mammary artery, right greater saphenous vein harvested endoscopically.;  Surgeon: Ivin Poot, MD;  Location: [place];  Service: Open Heart Surgery;  Laterality: N/A;  LIMA to LAD, SVG to DIAGONAL, SVG to OM1, SVG to PDA) with EVH of the RIGHT THIGH and LOWER EXTREMITY SAPHENOUS VEIN   Cytoscopy prostatic stone o/w nml  06/21/08   Dr. Jacqlyn Larsen   ETT myoview  09/13/09   Low risk EF 49%   High intense focused ultrasound  07/20/06   By Dr. Jacqlyn Larsen   INTRAOPERATIVE TRANSESOPHAGEAL ECHOCARDIOGRAM N/A 04/26/2014   Procedure: INTRAOPERATIVE TRANSESOPHAGEAL ECHOCARDIOGRAM;  Surgeon: Ivin Poot, MD;  Location: [place];  Service: Open Heart Surgery;  Laterality: N/A;   PERCUTANEOUS CORONARY ROTOBLATOR INTERVENTION (PCI-R) N/A 07/22/2014   Procedure: PERCUTANEOUS CORONARY ROTOBLATOR INTERVENTION (PCI-R);  Surgeon: Peter M Martinique, MD;  Location[facility]  CATH LAB;  Service: Cardiovascular;  Laterality: N/A;   PERIPHERAL VASCULAR CATHETERIZATION Left 07/26/2015   Procedure: Lower Extremity Angiography;  Surgeon: Katha Cabal, MD;  Location: [place] CV LAB;  Service: Cardiovascular;  Laterality: Left;   PERIPHERAL VASCULAR CATHETERIZATION  07/26/2015   Procedure: Lower Extremity Intervention;  Surgeon: Katha Cabal, MD;  Location: [place] CV LAB;  Service: Cardiovascular;;   PROSTATE BIOPSY  04/03/06 & 02/25/07   Social History   Socioeconomic History   Marital status: Married    Spouse name: Not on file   Number of children: 3   Years of education: Not on file   Highest education level: Not  on file  Occupational History   Occupation: Insurance account manager  Tobacco Use   Smoking status: Former    Packs/day: 1.00    Years: 36.00    Total pack years: 36.00    Types: Cigarettes    Quit date: 03/01/2014    Years since quitting: 8.0   Smokeless tobacco: Never  Vaping Use   Vaping Use: Never used  Substance and Sexual Activity   Alcohol use: Yes    Comment: 07/19/2014 "might have a drink a couple times/yr"   Drug use: No   Sexual activity: Not Currently  Other Topics Concern   Not on file  Social History Narrative   Lives with wife.; 2 daughters and 1 son.[employer] fan; semi-retd; Arboriculturist; Former Education officer, community, 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 In [place].    Social Determinants of Health   Financial Resource Strain: Not on file  Food Insecurity: Not on file  Transportation Needs: Not on file  Physical Activity: Not on file  Stress: Not on file  Social Connections: Not on file  Intimate Partner Violence: Not on file   Current Outpatient Medications on File Prior to Visit  Medication Sig Dispense Refill   aspirin EC 81 MG tablet Take 1 tablet (81 mg total) by mouth daily. 90 tablet 3   clopidogrel (PLAVIX) 75 MG  tablet TAKE 1 TABLET BY MOUTH ONCE DAILY 90 tablet 0   Dulaglutide (TRULICITY) 1.5 CL/2.7NT SOPN Inject 1.5 mg into the skin once a week. 2 mL 11   ezetimibe (ZETIA) 10 MG tablet Take 1 tablet (10 mg total) by mouth daily. (Patient not taking: Reported on 02/09/2022) 90 tablet 3   furosemide (LASIX) 20 MG tablet Take 1 tablet (20 mg total) by mouth as needed. 90 tablet 1   glucose blood (ONETOUCH ULTRA) test strip CHECK BLOOD SUGAR TWICE A DAY - DX E11.59 200 strip 3   insulin degludec (TRESIBA FLEXTOUCH) 200 UNIT/ML FlexTouch Pen Inject 100 Units into the skin daily. 18 mL 3   Insulin Pen Needle 32G X 4 MM MISC Use 1x a day 100 each 3   INVOKANA 300 MG TABS tablet TAKE 1 TABLET BY MOUTH ONCE A DAY WITH BREAKFAST 90 tablet 3   isosorbide mononitrate (IMDUR) 30 MG 24 hr tablet Take 0.5 tablets (15 mg total) by mouth daily. 45 tablet 3   losartan (COZAAR) 25 MG tablet TAKE 1 TABLET BY MOUTH ONCE A DAY 90 tablet 0   meclizine (ANTIVERT) 25 MG tablet Take 0.5-1 tablets (12.5-25 mg total) by mouth 2 (two) times daily as needed for dizziness or nausea. 30 tablet 0   MYRBETRIQ 50 MG TB24 tablet TAKE 1 TABLET BY MOUTH ONCE A DAY 30 tablet 12   nitroGLYCERIN (NITROSTAT) 0.4 MG SL tablet Place 1 tablet (0.4 mg total) under the tongue as needed. 25 tablet 2   REPATHA SURECLICK 700 MG/ML SOAJ INJECT 1 PEN INTO THE SKIN EVERY 14 DAYS 2 mL 1   sulfamethoxazole-trimethoprim (BACTRIM DS) 800-160 MG tablet Take 1 tablet by mouth 2 (two) times daily. 14 tablet 0   tamsulosin (FLOMAX) 0.4 MG CAPS capsule TAKE 1 CAPSULE BY MOUTH ONCE DAILY AFTERSUPPER 30 capsule 11   No current facility-administered medications on file prior to visit.   Allergies  Allergen Reactions   Coreg [Carvedilol] Other (See Comments)    Fatigue- unclear if this was due to coreg specifically or beta blockers in  general.     Metformin And Related Nausea Only   Protonix [Pantoprazole Sodium] Diarrhea   Rosuvastatin Calcium     REACTION:  JOINT ACHES   Statins     REACTION: JOINT ACHES   Family History  Problem Relation Age of Onset   Diabetes Mother 62       DM   Coronary artery disease Mother    Hypertension Mother    Heart attack Mother        CABG with MI in 2005   Heart disease Mother        CV   Hypertension Father    Heart attack Father        CABG with Mi around 1997   Coronary artery disease Father    Heart disease Father        CV   Diabetes Father    Heart attack Brother        MI/PTCA   Heart disease Brother        CV   Diabetes Brother    Heart attack Maternal Grandfather        MI   Prostate cancer Paternal Grandfather 85       likely metastatic   Diabetes Sister        DM   Breast cancer Neg Hx        Breast/ovarian/uterine cancer   Colon cancer Neg Hx    Depression Neg Hx    Alcohol abuse Neg Hx        ETOH/drug abuse   Stroke Neg Hx    PE: BP 128/68 (BP Location: Right Arm, Patient Position: Sitting, Cuff Size: Normal)   Pulse 68   Ht '5\' 9"'$  (1.753 m)   Wt 249 lb 12.8 oz (113.3 kg)   SpO2 98%   BMI 36.89 kg/m   Wt Readings from Last 3 Encounters:  03/01/22 249 lb 12.8 oz (113.3 kg)  02/09/22 246 lb 4 oz (111.7 kg)  02/08/22 245 lb (111.1 kg)   Constitutional: overweight, in NAD Eyes:  EOMI, no exophthalmos ENT: moist mucous membranes, no thyromegaly, no cervical lymphadenopathy Cardiovascular: RRR, No MRG Respiratory: CTA B Musculoskeletal: no deformities Skin: moist, warm, + stasis dermatitis rash on bilateral lower extremities Neurological: no tremor with outstretched hands  ASSESSMENT: 1. DM2, insulin-dependent, uncontrolled, with complications - CAD - s/p stents, CABG - cerebro-vascular ds - s/p CVA  - PVD - s/p L stent  2. HL  3.  Obesity class II BMI Classification: < 18.5 underweight  18.5-24.9 normal weight  25.0-29.9 overweight  30.0-34.9 class I obesity  35.0-39.9 class II obesity  ? 40.0 class III obesity   4.  Statin intolerance  PLAN:  1.  Patient with longstanding, insulin resistant diabetes, previously on U-500 insulin, but then off insulin completely after improvement of his sugars while on Invokana and Trulicity. In 2021, he relaxed his diet, gained a significant amount of weight, and sugars worsened so we had to start back on insulin.  She subsequently had to stop Trulicity due to GI symptoms in 11/2020 but then was able to restart at a lower dose, with good tolerance.  However, at last visit, per review of his log, sugars are very variable, which sugars in the 300s, but with very variable blood sugars especially in the morning and at bedtime.  Because of the high coefficient of variation, I did not recommend a change in treatment other than varying the dose of NovoLog based on the size of  his meals.  At that time, he was taking a fixed dose before every meal and we discussed that this is not physiologic.  I also recommended a CGM and gave him a list of suppliers. -He recently had another HbA1c obtained at the end of last month and this was higher, at 9.8% -At today's visit, sugars are much higher than before.  They are increasing drastically after dinner, even to 400s and 500s.  Sugars then decrease in the morning, but still too high values, up to 300s.  Later in the day, sugars are lower, even with values under 100 before dinner.  Upon questioning, he mentions that he is trying to avoid eating during the day because of the high blood sugars.  Therefore, he crams most of his calories at night, with dinner.  Unfortunately, he misses NovoLog with his meals many times as he forgets it.  Sometimes he remembers it 5 minutes before the meal but does not take it due to the fact that it should be taken 15 minutes before the meal.  We discussed to take the insulin anyway, but ideally 15 minutes before the meal.  Even when he takes the full dose recommended at last visit, sugars are still high after the meal so at this visit I advised him to increase  the dose.  Because of the sugars being more controlled earlier in the day, I advised him to vary the dose of insulin with his brunch, but I did not recommend to increase the dose. -I also strongly advised him to try to split his calories between his 2 meals and to move dinner earlier if possible -At next visit, we will probably need to switch to U-500 insulin if his sugars are not improving. -He did not get the CGM since last visit and I again advised him to check with different suppliers to see if this is covered.  However, they obtained a CGM from a friend but did not attach it. - I suggested to: Patient Instructions  Please continue: - Invokana  300 mg bfore b'fast - Trulicity 1.5 mg weekly - Tresiba 100 units daily   Change: - NovoLog: 20-24 units 15 min before brunch 30-38 units 15 min before dinner  The most common suppliers for the  [facility] continuous glucose monitor (CGM) are: [place] [employer]: [place]: [phone] Ext [phone] [employer]: [place]: [place]: [phone] [employer]: [phone]  Reduce the dinner size and try to move it earlier in the day.  Please return in 3-4 months with your sugar log/CGM.   - advised to check sugars at different times of the day - 1x a day, rotating check times - advised for yearly eye exams >> he is UTD - return to clinic in 3-4 months  2. HL -Reviewed latest lipid panel from 09/2019: Fractions at goal with the exception of a high LDL, above our target: Lab Results  Component Value Date   CHOL 166 10/09/2019   HDL 42 10/09/2019   LDLCALC 96 10/09/2019   LDLDIRECT 157.1 09/07/2013   TRIG 138 10/09/2019   CHOLHDL 4.0 10/09/2019  -He is intolerant to statins but he takes Repatha and ezetimibe without side effects -He is due for another lipid panel -he has a fasting appointment for labs coming up   3. Obesity -Also due to Lupron  injections -Previously lost a significant amount of weight (35 pounds) on a keto diet.  Weight was fluctuating afterwards, but  gained the majority of the weight back. -continue SGLT 2 inhibitor and GLP-1 receptor agonist which should also help with weight loss.  He had GI symptoms with higher doses of GLP-1 receptor agonist. -Weight at last visit: 244 pounds.  He gained 5 pounds since then.  4.  Statin intolerance -He had joint aches before, with statin use -He is currently on Repatha and ezetimibe  Philemon Kingdom, MD PhD [facility] Endocrinology

## 2022-03-02 ENCOUNTER — Ambulatory Visit (INDEPENDENT_AMBULATORY_CARE_PROVIDER_SITE_OTHER): Payer: Medicare Other

## 2022-03-02 VITALS — Ht 69.0 in | Wt 240.0 lb

## 2022-03-02 DIAGNOSIS — Z0000 Encounter for general adult medical examination without abnormal findings: Secondary | ICD-10-CM

## 2022-03-02 NOTE — Patient Instructions (Signed)
Phillip Clements , Thank you for taking time to come for your Medicare Wellness Visit. I appreciate your ongoing commitment to your health goals. Please review the following plan we discussed and let me know if I can assist you in the future.   Screening recommendations/referrals: Colonoscopy: completed 03/31/2021, due 04/01/2031 Recommended yearly ophthalmology/optometry visit for glaucoma screening and checkup Recommended yearly dental visit for hygiene and checkup  Vaccinations: Influenza vaccine: due 03/13/2022 Pneumococcal vaccine: due Tdap vaccine: due Shingles vaccine: discussed   Covid-19:  05/21/2020, 09/30/2019, 09/14/2019  Advanced directives: Please bring a copy of your POA (Power of Attorney) and/or Living Will to your next appointment.   Conditions/risks identified: none  Next appointment: Follow up in one year for your annual wellness visit.   Preventive Care 20 Years and Older, Male Preventive care refers to lifestyle choices and visits with your health care provider that can promote health and wellness. What does preventive care include? A yearly physical exam. This is also called an annual well check. Dental exams once or twice a year. Routine eye exams. Ask your health care provider how often you should have your eyes checked. Personal lifestyle choices, including: Daily care of your teeth and gums. Regular physical activity. Eating a healthy diet. Avoiding tobacco and drug use. Limiting alcohol use. Practicing safe sex. Taking low doses of aspirin every day. Taking vitamin and mineral supplements as recommended by your health care provider. What happens during an annual well check? The services and screenings done by your health care provider during your annual well check will depend on your age, overall health, lifestyle risk factors, and family history of disease. Counseling  Your health care provider may ask you questions about your: Alcohol use. Tobacco use. Drug  use. Emotional well-being. Home and relationship well-being. Sexual activity. Eating habits. History of falls. Memory and ability to understand (cognition). Work and work Statistician. Screening  You may have the following tests or measurements: Height, weight, and BMI. Blood pressure. Lipid and cholesterol levels. These may be checked every 5 years, or more frequently if you are over 29 years old. Skin check. Lung cancer screening. You may have this screening every year starting at age 60 if you have a 30-pack-year history of smoking and currently smoke or have quit within the past 15 years. Fecal occult blood test (FOBT) of the stool. You may have this test every year starting at age 41. Flexible sigmoidoscopy or colonoscopy. You may have a sigmoidoscopy every 5 years or a colonoscopy every 10 years starting at age 61. Prostate cancer screening. Recommendations will vary depending on your family history and other risks. Hepatitis C blood test. Hepatitis B blood test. Sexually transmitted disease (STD) testing. Diabetes screening. This is done by checking your blood sugar (glucose) after you have not eaten for a while (fasting). You may have this done every 1-3 years. Abdominal aortic aneurysm (AAA) screening. You may need this if you are a current or former smoker. Osteoporosis. You may be screened starting at age 89 if you are at high risk. Talk with your health care provider about your test results, treatment options, and if necessary, the need for more tests. Vaccines  Your health care provider may recommend certain vaccines, such as: Influenza vaccine. This is recommended every year. Tetanus, diphtheria, and acellular pertussis (Tdap, Td) vaccine. You may need a Td booster every 10 years. Zoster vaccine. You may need this after age 41. Pneumococcal 13-valent conjugate (PCV13) vaccine. One dose is recommended after age  65. Pneumococcal polysaccharide (PPSV23) vaccine. One dose is  recommended after age 70. Talk to your health care provider about which screenings and vaccines you need and how often you need them. This information is not intended to replace advice given to you by your health care provider. Make sure you discuss any questions you have with your health care provider. Document Released: 08/26/2015 Document Revised: 04/18/2016 Document Reviewed: 05/31/2015 Elsevier Interactive Patient Education  2017 [place] Prevention in the Home Falls can cause injuries. They can happen to people of all ages. There are many things you can do to make your home safe and to help prevent falls. What can I do on the outside of my home? Regularly fix the edges of walkways and driveways and fix any cracks. Remove anything that might make you trip as you walk through a door, such as a raised step or threshold. Trim any bushes or trees on the path to your home. Use bright outdoor lighting. Clear any walking paths of anything that might make someone trip, such as rocks or tools. Regularly check to see if handrails are loose or broken. Make sure that both sides of any steps have handrails. Any raised decks and porches should have guardrails on the edges. Have any leaves, snow, or ice cleared regularly. Use sand or salt on walking paths during winter. Clean up any spills in your garage right away. This includes oil or grease spills. What can I do in the bathroom? Use night lights. Install grab bars by the toilet and in the tub and shower. Do not use towel bars as grab bars. Use non-skid mats or decals in the tub or shower. If you need to sit down in the shower, use a plastic, non-slip stool. Keep the floor dry. Clean up any water that spills on the floor as soon as it happens. Remove soap buildup in the tub or shower regularly. Attach bath mats securely with double-sided non-slip rug tape. Do not have throw rugs and other things on the floor that can make you  trip. What can I do in the bedroom? Use night lights. Make sure that you have a light by your bed that is easy to reach. Do not use any sheets or blankets that are too big for your bed. They should not hang down onto the floor. Have a firm chair that has side arms. You can use this for support while you get dressed. Do not have throw rugs and other things on the floor that can make you trip. What can I do in the kitchen? Clean up any spills right away. Avoid walking on wet floors. Keep items that you use a lot in easy-to-reach places. If you need to reach something above you, use a strong step stool that has a grab bar. Keep electrical cords out of the way. Do not use floor polish or wax that makes floors slippery. If you must use wax, use non-skid floor wax. Do not have throw rugs and other things on the floor that can make you trip. What can I do with my stairs? Do not leave any items on the stairs. Make sure that there are handrails on both sides of the stairs and use them. Fix handrails that are broken or loose. Make sure that handrails are as long as the stairways. Check any carpeting to make sure that it is firmly attached to the stairs. Fix any carpet that is loose or worn. Avoid having throw rugs at  the top or bottom of the stairs. If you do have throw rugs, attach them to the floor with carpet tape. Make sure that you have a light switch at the top of the stairs and the bottom of the stairs. If you do not have them, ask someone to add them for you. What else can I do to help prevent falls? Wear shoes that: Do not have high heels. Have rubber bottoms. Are comfortable and fit you well. Are closed at the toe. Do not wear sandals. If you use a stepladder: Make sure that it is fully opened. Do not climb a closed stepladder. Make sure that both sides of the stepladder are locked into place. Ask someone to hold it for you, if possible. Clearly mark and make sure that you can  see: Any grab bars or handrails. First and last steps. Where the edge of each step is. Use tools that help you move around (mobility aids) if they are needed. These include: Canes. Walkers. Scooters. Crutches. Turn on the lights when you go into a dark area. Replace any light bulbs as soon as they burn out. Set up your furniture so you have a clear path. Avoid moving your furniture around. If any of your floors are uneven, fix them. If there are any pets around you, be aware of where they are. Review your medicines with your doctor. Some medicines can make you feel dizzy. This can increase your chance of falling. Ask your doctor what other things that you can do to help prevent falls. This information is not intended to replace advice given to you by your health care provider. Make sure you discuss any questions you have with your health care provider. Document Released: 05/26/2009 Document Revised: 01/05/2016 Document Reviewed: 09/03/2014 Elsevier Interactive Patient Education  2017 [employer].

## 2022-03-02 NOTE — Progress Notes (Signed)
I connected with [name] today by telephone and verified that I am speaking with the correct person using two identifiers. Location patient: home Location provider: work Persons participating in the virtual visit: [name], Glenna Durand LPN.   I discussed the limitations, risks, security and privacy concerns of performing an evaluation and management service by telephone and the availability of in person appointments. I also discussed with the patient that there may be a patient responsible charge related to this service. The patient expressed understanding and verbally consented to this telephonic visit.    Interactive audio and video telecommunications were attempted between this provider and patient, however failed, due to patient having technical difficulties OR patient did not have access to video capability.  We continued and completed visit with audio only.     Vital signs may be patient reported or missing.  Subjective:   Phillip Clements is a 71 y.o. male who presents for Medicare Annual/Subsequent preventive examination.  Review of Systems     Cardiac Risk Factors include: advanced age (>68mn, >>40women);diabetes mellitus;dyslipidemia;hypertension;male gender;obesity (BMI >30kg/m2)     Objective:    Today's Vitals   03/02/22 0811 03/02/22 0812  Weight: 240 lb (108.9 kg)   Height: '5\' 9"'$  (1.753 m)   PainSc:  1    Body mass index is 35.44 kg/m.     03/02/2022    8:18 AM 12/25/2021   10:47 AM 12/01/2021    9:28 AM 09/25/2021   11:04 AM 06/26/2021   11:05 AM 06/02/2021    9:21 AM 03/27/2021   10:30 AM  Advanced Directives  Does Patient Have a Medical Advance Directive? Yes Yes Yes Yes Yes No Yes  Type of AParamedicof [place]Living will H[place]sonvilleLiving will HRoy[place]Living will HChavesLiving will HPay[place]Living will  HClarendonLiving  will  Does patient want to make changes to medical advance directive?   No - Patient declined      Copy of HGuthriein Chart? No - copy requested No - copy requested No - copy requested No - copy requested     Would patient like information on creating a medical advance directive?      No - Patient declined     Current Medications (verified) Outpatient Encounter Medications as of 03/02/2022  Medication Sig   aspirin EC 81 MG tablet Take 1 tablet (81 mg total) by mouth daily.   clopidogrel (PLAVIX) 75 MG tablet TAKE 1 TABLET BY MOUTH ONCE DAILY   Dulaglutide (TRULICITY) 1.5 MGG/2.6RSSOPN Inject 1.5 mg into the skin once a week.   furosemide (LASIX) 20 MG tablet Take 1 tablet (20 mg total) by mouth as needed.   glucose blood (ONETOUCH ULTRA) test strip CHECK BLOOD SUGAR TWICE A DAY - DX E11.59   insulin degludec (TRESIBA FLEXTOUCH) 200 UNIT/ML FlexTouch Pen Inject 100 Units into the skin daily.   Insulin Pen Needle 32G X 4 MM MISC Use 1x a day   INVOKANA 300 MG TABS tablet TAKE 1 TABLET BY MOUTH ONCE A DAY WITH BREAKFAST   isosorbide mononitrate (IMDUR) 30 MG 24 hr tablet Take 0.5 tablets (15 mg total) by mouth daily.   MYRBETRIQ 50 MG TB24 tablet TAKE 1 TABLET BY MOUTH ONCE A DAY   nitroGLYCERIN (NITROSTAT) 0.4 MG SL tablet Place 1 tablet (0.4 mg total) under the tongue as needed.   REPATHA SURECLICK 1854MG/ML SOAJ  INJECT 1 PEN INTO THE SKIN EVERY 14 DAYS   tamsulosin (FLOMAX) 0.4 MG CAPS capsule TAKE 1 CAPSULE BY MOUTH ONCE DAILY AFTERSUPPER   ezetimibe (ZETIA) 10 MG tablet Take 1 tablet (10 mg total) by mouth daily. (Patient not taking: Reported on 02/09/2022)   losartan (COZAAR) 25 MG tablet TAKE 1 TABLET BY MOUTH ONCE A DAY (Patient not taking: Reported on 03/02/2022)   meclizine (ANTIVERT) 25 MG tablet Take 0.5-1 tablets (12.5-25 mg total) by mouth 2 (two) times daily as needed for dizziness or nausea. (Patient not taking: Reported on 03/02/2022)    sulfamethoxazole-trimethoprim (BACTRIM DS) 800-160 MG tablet Take 1 tablet by mouth 2 (two) times daily. (Patient not taking: Reported on 03/02/2022)   No facility-administered encounter medications on file as of 03/02/2022.    Allergies (verified) Coreg [carvedilol], Metformin and related, Protonix [pantoprazole sodium], Rosuvastatin calcium, and Statins   History: Past Medical History:  Diagnosis Date   Cataract    bilateral eye in 2010   Coronary artery disease    a. CABG 04/2014: (LIMA->LAD, VG->DIAG, VG->OM2, VG->PDA)  b. 07/2014 early graft failure (VG->OM2 60, LIMA->LAD atretic, VG->PDA & VG->D1 100). s/p successful rotational atherectomy & DES of the LM & pLAD (3.0x24 Promus DES) & mLAD (2.25x24 Promus DES).   CVA (cerebral infarction)    Family history of prostate cancer    Hx of adenomatous colonic polyps 06/17/2017   Hyperlipidemia    a. Statin intolerant.   Hypertension 2000   Moderate mitral regurgitation    a. 02/2015 Echo: EF 55-60%, no rwma, mod MR, mod BAE, mildly dil RV w/ low nl RV fxn.   OSA on CPAP    Peripheral vascular disease ([place])    a. left SFA angioplasty in December 2016   Prostate cancer [facility])    Sleep apnea    wears CPAP nightly   Type II diabetes mellitus [facility])    Past Surgical History:  Procedure Laterality Date   ANTERIOR CERVICAL DECOMP/DISCECTOMY FUSION  08/25/2012   Procedure: ANTERIOR CERVICAL DECOMPRESSION/DISCECTOMY FUSION 2 LEVELS;  Surgeon: Hosie Spangle, MD;  Location: [place] [facility];  Service: Neurosurgery;  Laterality: N/A;  Cervical five-six Cervical six-seven anterior cervical decompression with fusion and plating and bonegraft   BACK SURGERY     CARDIAC CATHETERIZATION  04/2014; 07/19/2014   CORONARY ARTERY BYPASS GRAFT N/A 04/26/2014   Procedure: CORONARY ARTERY BYPASS GRAFTING (CABG), on pump, times four, using left internal mammary artery, right greater saphenous vein harvested endoscopically.;  Surgeon: Ivin Poot, MD;  Location:  [place];  Service: Open Heart Surgery;  Laterality: N/A;  LIMA to LAD, SVG to DIAGONAL, SVG to OM1, SVG to PDA) with EVH of the RIGHT THIGH and LOWER EXTREMITY SAPHENOUS VEIN   Cytoscopy prostatic stone o/w nml  06/21/08   Dr. Jacqlyn Larsen   ETT myoview  09/13/09   Low risk EF 49%   High intense focused ultrasound  07/20/06   By Dr. Jacqlyn Larsen   INTRAOPERATIVE TRANSESOPHAGEAL ECHOCARDIOGRAM N/A 04/26/2014   Procedure: INTRAOPERATIVE TRANSESOPHAGEAL ECHOCARDIOGRAM;  Surgeon: Ivin Poot, MD;  Location: [place];  Service: Open Heart Surgery;  Laterality: N/A;   PERCUTANEOUS CORONARY ROTOBLATOR INTERVENTION (PCI-R) N/A 07/22/2014   Procedure: PERCUTANEOUS CORONARY ROTOBLATOR INTERVENTION (PCI-R);  Surgeon: Peter M Martinique, MD;  Location: [facility] CATH LAB;  Service: Cardiovascular;  Laterality: N/A;   PERIPHERAL VASCULAR CATHETERIZATION Left 07/26/2015   Procedure: Lower Extremity Angiography;  Surgeon: Katha Cabal, MD;  Location: [place] CV LAB;  Service:  Cardiovascular;  Laterality: Left;   PERIPHERAL VASCULAR CATHETERIZATION  07/26/2015   Procedure: Lower Extremity Intervention;  Surgeon: Katha Cabal, MD;  Location: [place] CV LAB;  Service: Cardiovascular;;   PROSTATE BIOPSY  04/03/06 & 02/25/07   Family History  Problem Relation Age of Onset   Diabetes Mother 63       DM   Coronary artery disease Mother    Hypertension Mother    Heart attack Mother        CABG with MI in 2005   Heart disease Mother        CV   Hypertension Father    Heart attack Father        CABG with Mi around 1997   Coronary artery disease Father    Heart disease Father        CV   Diabetes Father    Heart attack Brother        MI/PTCA   Heart disease Brother        CV   Diabetes Brother    Heart attack Maternal Grandfather        MI   Prostate cancer Paternal Grandfather 85       likely metastatic   Diabetes Sister        DM   Breast cancer Neg Hx        Breast/ovarian/uterine cancer   Colon cancer  Neg Hx    Depression Neg Hx    Alcohol abuse Neg Hx        ETOH/drug abuse   Stroke Neg Hx    Social History   Socioeconomic History   Marital status: Married    Spouse name: Not on file   Number of children: 3   Years of education: Not on file   Highest education level: Not on file  Occupational History   Occupation: Scientist, 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  Tobacco Use   Smoking status: Former    Packs/day: 1.00    Years: 36.00    Total pack years: 36.00    Types: Cigarettes    Quit date: 03/01/2014    Years since quitting: 8.0   Smokeless tobacco: Never  Vaping Use   Vaping Use: Never used  Substance and Sexual Activity   Alcohol use: Yes    Comment: 07/19/2014 "might have a drink a couple times/yr"   Drug use: No   Sexual activity: Not Currently  Other Topics Concern   Not on file  Social History Narrative   Lives with wife.; 2 daughters and 1 son.[employer] fan; semi-retd; Arboriculturist; Former Education officer, community, 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 In [place].    Social Determinants of Health   Financial Resource Strain: Low Risk  (03/02/2022)   Overall Financial Resource Strain (CARDIA)    Difficulty of Paying Living Expenses: Not hard at all  Food Insecurity: No Food Insecurity (03/02/2022)   Hunger Vital Sign    Worried About Running Out of Food in the Last Year: Never true    Ran Out of Food in the Last Year: Never true  Transportation Needs: No Transportation Needs (03/02/2022)   PRAPARE - Hydrologist (Medical): No    Lack of Transportation (Non-Medical): No  Physical Activity: Sufficiently Active (03/02/2022)   Exercise Vital Sign    Days of Exercise per Week: 7 days    Minutes of Exercise per Session: 90 min  Stress:  No Stress Concern Present (03/02/2022)   [place]    Feeling of Stress : Not at all   Social Connections: Not on file    Tobacco Counseling Counseling given: Not Answered   Clinical Intake:  Pre-visit preparation completed: Yes  Pain : 0-10 Pain Score: 1  Pain Type: Chronic pain Pain Location: Ankle Pain Descriptors / Indicators: Aching Pain Onset: More than a month ago Pain Frequency: Intermittent     Nutritional Status: BMI > 30  Obese Nutritional Risks: None Diabetes: Yes  How often do you need to have someone help you when you read instructions, pamphlets, or other written materials from your doctor or pharmacy?: 1 - Never  Diabetic? Yes Nutrition Risk Assessment:  Has the patient had any N/V/D within the last 2 months?  No  Does the patient have any non-healing wounds?  No  Has the patient had any unintentional weight loss or weight gain?  No   Diabetes:  Is the patient diabetic?  Yes  If diabetic, was a CBG obtained today?  No  Did the patient bring in their glucometer from home?  No  How often do you monitor your CBG's? Twice daily.   Financial Strains and Diabetes Management:  Are you having any financial strains with the device, your supplies or your medication? No .  Does the patient want to be seen by Chronic Care Management for management of their diabetes?  No  Would the patient like to be referred to a Nutritionist or for Diabetic Management?  No   Diabetic Exams:  Diabetic Eye Exam: Overdue for diabetic eye exam. Pt has been advised about the importance in completing this exam. Patient advised to call and schedule an eye exam. Diabetic Foot Exam: Overdue, Pt has been advised about the importance in completing this exam. Pt is scheduled for diabetic foot exam on next appointment.   Interpreter Needed?: No  Information entered by :: NAllen LPN   Activities of Daily Living    03/02/2022    8:19 AM  In your present state of health, do you have any difficulty performing the following activities:  Hearing? 1  Vision? 0   Difficulty concentrating or making decisions? 1  Comment trouble with names  Walking or climbing stairs? 0  Dressing or bathing? 0  Doing errands, shopping? 0  Preparing Food and eating ? N  Using the Toilet? N  In the past six months, have you accidently leaked urine? Y  Do you have problems with loss of bowel control? N  Managing your Medications? N  Managing your Finances? N  Housekeeping or managing your Housekeeping? N    Patient Care Team: Tonia Ghent, MD as PCP - General (Family Medicine) Wellington Hampshire, MD as PCP - Cardiology (Cardiology)  Indicate any recent Medical Services you may have received from other than [facility] providers in the past year (date may be approximate).     Assessment:   This is a routine wellness examination for [place].  Hearing/Vision screen Vision Screening - Comments:: Regular eye exams, [place]  Dietary issues and exercise activities discussed: Current Exercise Habits: The patient has a physically strenuous job, but has no regular exercise apart from work.   Goals Addressed             This Visit's Progress    Patient Stated       03/02/2022, wants to lose weight  Depression Screen    03/02/2022    8:18 AM 02/08/2022    9:17 AM 11/28/2018    2:36 PM 10/05/2016    9:35 AM  PHQ 2/9 Scores  PHQ - 2 Score 0 0 0 0  PHQ- 9 Score   0     Fall Risk    03/02/2022    8:18 AM 02/08/2022    9:17 AM 11/11/2020    9:02 AM 11/28/2018    2:36 PM 06/27/2018   11:27 AM  [place] in the past year? 0 0 0 0 0  Comment     E[place]i Telephone Survey: data to providers prior to load  Number falls in past yr: 0 0 0    Injury with Fall? 0 0 0    Risk for fall due to : Medication side effect No Fall Risks No Fall Risks    Follow up Falls evaluation completed;Education provided;Falls prevention discussed Falls evaluation completed Falls evaluation completed      FALL RISK PREVENTION PERTAINING TO THE HOME:  Any stairs in  or around the home? No  If so, are there any without handrails?  N/a Home free of loose throw rugs in walkways, pet beds, electrical cords, etc? Yes  Adequate lighting in your home to reduce risk of falls? Yes   ASSISTIVE DEVICES UTILIZED TO PREVENT FALLS:  Life alert? No  Use of a cane, walker or w/c? No  Grab bars in the bathroom? No  Shower chair or bench in shower? No  Elevated toilet seat or a handicapped toilet? No   TIMED UP AND GO:  Was the test performed? No .      Cognitive Function:    11/28/2018    7:23 PM  MMSE - Mini Mental State Exam  Not completed: Unable to complete        03/02/2022    8:21 AM  6CIT Screen  What Year? 0 points  What month? 0 points  What time? 0 points  Count back from 20 0 points  Months in reverse 0 points  Repeat phrase 0 points  Total Score 0 points    Immunizations Immunization History  Administered Date(s) Administered   Fluad Quad(high Dose 65+) 06/26/2019, 06/22/2021   Influenza, High Dose Seasonal PF 06/07/2017, 04/15/2018   Influenza,inj,Quad PF,6+ Mos 05/01/2013, 05/02/2014, 05/18/2016   PFIZER(Purple Top)SARS-COV-2 Vaccination 09/14/2019, 09/30/2019, 05/21/2020   Td 08/13/1998    TDAP status: Due, Education has been provided regarding the importance of this vaccine. Advised may receive this vaccine at local pharmacy or Health Dept. Aware to provide a copy of the vaccination record if obtained from local pharmacy or Health Dept. Verbalized acceptance and understanding.  Flu Vaccine status: Up to date  Pneumococcal vaccine status: Due, Education has been provided regarding the importance of this vaccine. Advised may receive this vaccine at local pharmacy or Health Dept. Aware to provide a copy of the vaccination record if obtained from local pharmacy or Health Dept. Verbalized acceptance and understanding.  Covid-19 vaccine status: Completed vaccines  Qualifies for Shingles Vaccine? Yes   Zostavax completed No    Shingrix Completed?: No.    Education has been provided regarding the importance of this vaccine. Patient has been advised to call insurance company to determine out of pocket expense if they have not yet received this vaccine. Advised may also receive vaccine at local pharmacy or Health Dept. Verbalized acceptance and understanding.  Screening Tests Health Maintenance  Topic Date Due  Pneumonia Vaccine 9+ Years old (1 - PCV) Never done   Hepatitis C Screening  Never done   Zoster Vaccines- Shingrix (1 of 2) Never done   TETANUS/TDAP  08/13/2008   FOOT EXAM  10/21/2015   COVID-19 Vaccine (4 - Booster for Pfizer series) 07/16/2020   OPHTHALMOLOGY EXAM  05/26/2021   INFLUENZA VACCINE  03/13/2022   HEMOGLOBIN A1C  08/10/2022   COLONOSCOPY (Pts 45-33yr Insurance coverage will need to be confirmed)  04/01/2031   HPV VACCINES  Aged Out   Fecal DNA (Cologuard)  Discontinued    Health Maintenance  Health Maintenance Due  Topic Date Due   Pneumonia Vaccine 71 Years old (1 - PCV) Never done   Hepatitis C Screening  Never done   Zoster Vaccines- Shingrix (1 of 2) Never done   TETANUS/TDAP  08/13/2008   FOOT EXAM  10/21/2015   COVID-19 Vaccine (4 - Booster for Pfizer series) 07/16/2020   OPHTHALMOLOGY EXAM  05/26/2021    Colorectal cancer screening: Type of screening: Colonoscopy. Completed 03/31/2021. Repeat every 10 years  Lung Cancer Screening: (Low Dose CT Chest recommended if Age 71-80years, 30 pack-year currently smoking OR have quit w/in 15years.) does not qualify.   Lung Cancer Screening Referral: no  Additional Screening:  Hepatitis C Screening: does qualify;   Vision Screening: Recommended annual ophthalmology exams for early detection of glaucoma and other disorders of the eye. Is the patient up to date with their annual eye exam?  No  Who is the provider or what is the name of the [facility] in which the patient [facility]ends annual [facility] exams[facility]  not established with a provider, would they like to be referred to a provider to establish care? No .   Dental Screening: Recommended annual dental exams for proper oral hygiene  Community Resource Referral / Chronic Care Management: CRR required this visit?  No   CCM required this visit?  No      Plan:     I have personally reviewed and noted the following in the patient's chart:   Medical and social history Use of alcohol, tobacco or illicit drugs  Current medications and supplements including opioid prescriptions. Patient is not currently taking opioid prescriptions. Functional ability and status Nutritional status Physical activity Advanced directives List of other physicians Hospitalizations, surgeries, and ER visits in previous 12 months Vitals Screenings to include cognitive, depression, and falls Referrals and appointments  In addition, I have reviewed and discussed with patient certain preventive protocols, quality metrics, and best practice recommendations. A written personalized care plan for preventive services as well as general preventive health recommendations were provided to patient.     NKellie Simmering LPN   [id number]  Nurse Notes: none  Due to this being a virtual visit, the after visit summary with patients personalized plan was offered to patient via mail or my-chart. Patient would like to access on my-chart

## 2022-03-04 ENCOUNTER — Encounter: Payer: Self-pay | Admitting: Internal Medicine

## 2022-03-06 ENCOUNTER — Ambulatory Visit (INDEPENDENT_AMBULATORY_CARE_PROVIDER_SITE_OTHER): Payer: Medicare Other | Admitting: Medical

## 2022-03-06 ENCOUNTER — Encounter: Payer: Self-pay | Admitting: Medical

## 2022-03-06 ENCOUNTER — Other Ambulatory Visit
Admission: RE | Admit: 2022-03-06 | Discharge: 2022-03-06 | Disposition: A | Discharge status: Home / Self Care | Payer: Medicare Other | Source: Ambulatory Visit | Attending: Medical | Admitting: Medical

## 2022-03-06 ENCOUNTER — Other Ambulatory Visit: Payer: Self-pay | Admitting: Cardiovascular Disease

## 2022-03-06 VITALS — BP 98/60 | HR 71 | Ht 69.0 in | Wt 250.6 lb

## 2022-03-06 DIAGNOSIS — I10 Essential (primary) hypertension: Secondary | ICD-10-CM

## 2022-03-06 DIAGNOSIS — I2510 Atherosclerotic heart disease of native coronary artery without angina pectoris: Secondary | ICD-10-CM

## 2022-03-06 DIAGNOSIS — R9439 Abnormal result of other cardiovascular function study: Secondary | ICD-10-CM | POA: Diagnosis not present

## 2022-03-06 DIAGNOSIS — R0609 Other forms of dyspnea: Secondary | ICD-10-CM | POA: Insufficient documentation

## 2022-03-06 DIAGNOSIS — E785 Hyperlipidemia, unspecified: Secondary | ICD-10-CM

## 2022-03-06 LAB — BASIC METABOLIC PANEL
Anion gap: 9 (ref 5–15)
BUN: 19 mg/dL (ref 8–23)
CO2: 26 mmol/L (ref 22–32)
Calcium: 9.5 mg/dL (ref 8.9–10.3)
Chloride: 103 mmol/L (ref 98–111)
Creatinine, Ser: 1 mg/dL (ref 0.61–1.24)
GFR, Estimated: >60 mL/min (ref >60)
Glucose, Bld: 163 mg/dL — ABNORMAL HIGH (ref 70–99)
Potassium: 4.6 mmol/L (ref 3.5–5.1)
Sodium: 138 mmol/L (ref 135–145)

## 2022-03-06 LAB — CBC
HCT: 45.6 % (ref 39.0–52.0)
Hemoglobin: 15.2 g/dL (ref 13.0–17.0)
MCH: 28.9 pg (ref 26.0–34.0)
MCHC: 33.3 g/dL (ref 30.0–36.0)
MCV: 86.7 fL (ref 80.0–100.0)
Platelets: 189 10*3/uL (ref 150–400)
RBC: 5.26 MIL/uL (ref 4.22–5.81)
RDW: 12.7 % (ref 11.5–15.5)
WBC: 5.6 10*3/uL (ref 4.0–10.5)
nRBC: 0 % (ref 0.0–0.2)

## 2022-03-06 MED ORDER — SODIUM CHLORIDE 0.9% FLUSH: 3.0000 mL | Freq: Two times a day (BID) | INTRAVENOUS | Status: DC

## 2022-03-06 NOTE — H&P (View-Only) (Signed)
Cardiology Office Note:    Date:  03/06/2022   ID:  Phillip Clements, DOB 04/15/1951, MRN [id number]  PCP:  Tonia Ghent, MD  [facility] Cardiologist:  [name], MD  [facility] Electrophysiologist:  None   Referring MD: Tonia Ghent, MD   Chief Complaint: stress test follow-up  History of Present Illness:    [name] is a 71 y.o. male with a hx of CAD/CABG, s/p PCI to left main-LAD, PAD s/p left SFA angioplasty 2016, former smoker x40+ years, OSA on CPAP, diabetes presenting for follow-up.   He had CABG in September 2015 for severe 3V CAD. EF was 45%. He developed recurrent angina in November 2015. HE underwent nuclear stress test which was abnormal. He had inferolateral STE with exercise. Nuclear imaging showed evidence of inferior and anterior ischemia. Repeath cardiac cath showed occluded grafts, except for SVG to OM2, LIMA to LAD. EF was 55%. He underwent atherectomy and stenting of the left main and LAD. He is intolerance to statins.   Echo 2021 showed normal EF with normal diastolic function  He was seen 02/09/22 for chest pain. Imdur was started and Myoview was ordered. Coreg was held for low BP.  Myoview showed ischemia and prior MI, evidence of ischemia, overall intermediate risk study  Today, the Myovioew was reviewed. He reports he has shortness of breath when he exerts himself. He was having chest burning, but denies any recurrence of this. He is unsure if the chest burning was from GERD. He is taking lasix as needed. Reports he is fairly active. Heart cath was dicussed.   Past Medical History:  Diagnosis Date   Cataract    bilateral eye in 2010   Coronary artery disease    a. CABG 04/2014: (LIMA->LAD, VG->DIAG, VG->OM2, VG->PDA)  b. 07/2014 early graft failure (VG->OM2 60, LIMA->LAD atretic, VG->PDA & VG->D1 100). s/p successful rotational atherectomy & DES of the LM & pLAD (3.0x24 Promus DES) & mLAD (2.25x24 Promus DES).   CVA (cerebral  infarction)    Family history of prostate cancer    Hx of adenomatous colonic polyps 06/17/2017   Hyperlipidemia    a. Statin intolerant.   Hypertension 2000   Moderate mitral regurgitation    a. 02/2015 Echo: EF 55-60%, no rwma, mod MR, mod BAE, mildly dil RV w/ low nl RV fxn.   OSA on CPAP    Peripheral vascular disease ([place])    a. left SFA angioplasty in December 2016   Prostate cancer [facility])    Sleep apnea    wears CPAP nightly   Type II diabetes mellitus [facility])     Past Surgical History:  Procedure Laterality Date   ANTERIOR CERVICAL DECOMP/DISCECTOMY FUSION  08/25/2012   Procedure: ANTERIOR CERVICAL DECOMPRESSION/DISCECTOMY FUSION 2 LEVELS;  Surgeon: Hosie Spangle, MD;  Location: [place] NEURO ORS;  Service: Neurosurgery;  Laterality: N/A;  Cervical five-six Cervical six-seven anterior cervical decompression with fusion and plating and bonegraft   BACK SURGERY     CARDIAC CATHETERIZATION  04/2014; 07/19/2014   CORONARY ARTERY BYPASS GRAFT N/A 04/26/2014   Procedure: CORONARY ARTERY BYPASS GRAFTING (CABG), on pump, times four, using left internal mammary artery, right greater saphenous vein harvested endoscopically.;  Surgeon: Ivin Poot, MD;  Location: [place];  Service: Open Heart Surgery;  Laterality: N/A;  LIMA to LAD, SVG to DIAGONAL, SVG to OM1, SVG to PDA) with EVH of the RIGHT THIGH and LOWER EXTREMITY SAPHENOUS VEIN   Cytoscopy prostatic stone  o/w nml  06/21/08   Dr. Jacqlyn Larsen   ETT myoview  09/13/09   Low risk EF 49%   High intense focused ultrasound  07/20/06   By Dr. Jacqlyn Larsen   INTRAOPERATIVE TRANSESOPHAGEAL ECHOCARDIOGRAM N/A 04/26/2014   Procedure: INTRAOPERATIVE TRANSESOPHAGEAL ECHOCARDIOGRAM;  Surgeon: Ivin Poot, MD;  Location: [place];  Service: Open Heart Surgery;  Laterality: N/A;   PERCUTANEOUS CORONARY ROTOBLATOR INTERVENTION (PCI-R) N/A 07/22/2014   Procedure: PERCUTANEOUS CORONARY ROTOBLATOR INTERVENTION (PCI-R);  Surgeon: Peter M Martinique, MD;  Location: [facility] CATH LAB;   Service: Cardiovascular;  Laterality: N/A;   PERIPHERAL VASCULAR CATHETERIZATION Left 07/26/2015   Procedure: Lower Extremity Angiography;  Surgeon: Katha Cabal, MD;  Location: [place] CV LAB;  Service: Cardiovascular;  Laterality: Left;   PERIPHERAL VASCULAR CATHETERIZATION  07/26/2015   Procedure: Lower Extremity Intervention;  Surgeon: Katha Cabal, MD;  Location: [place] CV LAB;  Service: Cardiovascular;;   PROSTATE BIOPSY  04/03/06 & 02/25/07    Current Medications: Current Meds  Medication Sig   aspirin EC 81 MG tablet Take 1 tablet (81 mg total) by mouth daily.   clopidogrel (PLAVIX) 75 MG tablet TAKE 1 TABLET BY MOUTH ONCE DAILY   Dulaglutide (TRULICITY) 1.5 TI/4.5YK SOPN Inject 1.5 mg into the skin once a week.   ezetimibe (ZETIA) 10 MG tablet Take 1 tablet (10 mg total) by mouth daily.   furosemide (LASIX) 20 MG tablet Take 1 tablet (20 mg total) by mouth as needed.   glucose blood (ONETOUCH ULTRA) test strip CHECK BLOOD SUGAR TWICE A DAY - DX E11.59   insulin degludec (TRESIBA FLEXTOUCH) 200 UNIT/ML FlexTouch Pen Inject 100 Units into the skin daily.   Insulin Pen Needle 32G X 4 MM MISC Use 1x a day   INVOKANA 300 MG TABS tablet TAKE 1 TABLET BY MOUTH ONCE A DAY WITH BREAKFAST   isosorbide mononitrate (IMDUR) 30 MG 24 hr tablet Take 0.5 tablets (15 mg total) by mouth daily.   MYRBETRIQ 50 MG TB24 tablet TAKE 1 TABLET BY MOUTH ONCE A DAY   nitroGLYCERIN (NITROSTAT) 0.4 MG SL tablet Place 1 tablet (0.4 mg total) under the tongue as needed.   REPATHA SURECLICK 998 MG/ML SOAJ INJECT 1 PEN INTO THE SKIN EVERY 14 DAYS   tamsulosin (FLOMAX) 0.4 MG CAPS capsule TAKE 1 CAPSULE BY MOUTH ONCE DAILY AFTERSUPPER   Current Facility-Administered Medications for the 03/06/22 encounter (Office Visit) with Kathlen Mody, Elridge Stemm H, PA-C  Medication   sodium chloride flush (NS) 0.9 % injection 3 mL     Allergies:   Coreg [carvedilol], Metformin and related, Protonix [pantoprazole  sodium], Rosuvastatin calcium, and Statins   Social History   Socioeconomic History   Marital status: Married    Spouse name: Not on file   Number of children: 3   Years of education: Not on file   Highest education level: Not on file  Occupational History   Occupation: Scientist, 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  Tobacco Use   Smoking status: Former    Packs/day: 1.00    Years: 36.00    Total pack years: 36.00    Types: Cigarettes    Quit date: 03/01/2014    Years since quitting: 8.0   Smokeless tobacco: Never  Vaping Use   Vaping Use: Never used  Substance and Sexual Activity   Alcohol use: Yes    Comment: 07/19/2014 "might have a drink a couple times/yr"   Drug use: No   Sexual activity: Not Currently  Other Topics Concern   Not on file  Social History Narrative   Lives with wife.; 2 daughters and 1 son.[employer] fan; semi-retd; Arboriculturist; Former Education officer, community, 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 In [place].    Social Determinants of Health   Financial Resource Strain: Low Risk  (03/02/2022)   Overall Financial Resource Strain (CARDIA)    Difficulty of Paying Living Expenses: Not hard at all  Food Insecurity: No Food Insecurity (03/02/2022)   Hunger Vital Sign    Worried About Running Out of Food in the Last Year: Never true    Ran Out of Food in the Last Year: Never true  Transportation Needs: No Transportation Needs (03/02/2022)   PRAPARE - Hydrologist (Medical): No    Lack of Transportation (Non-Medical): No  Physical Activity: Sufficiently Active (03/02/2022)   Exercise Vital Sign    Days of Exercise per Week: 7 days    Minutes of Exercise per Session: 90 min  Stress: No Stress Concern Present (03/02/2022)   [place]    Feeling of Stress : Not at all  Social Connections: Not on file     Family  History: The patient's family history includes Coronary artery disease in his father and mother; Diabetes in his brother, father, and sister; Diabetes (age of onset: 14) in his mother; Heart attack in his brother, father, maternal grandfather, and mother; Heart disease in his brother, father, and mother; Hypertension in his father and mother; Prostate cancer (age of onset: 52) in his paternal grandfather. There is no history of Breast cancer, Colon cancer, Depression, Alcohol abuse, or Stroke.  ROS:   Please see the history of present illness.     All other systems reviewed and are negative.  EKGs/Labs/Other Studies Reviewed:    The following studies were reviewed today:  Myoview Lexiscan 02/2021    Findings are consistent with ischemia and prior myocardial infarction. The study is intermediate risk.   No ST deviation was noted.   LV perfusion is abnormal. There is evidence of ischemia. There is evidence of infarction. Defect 1: There is a medium defect with severe reduction in uptake present in the mid to basal inferior location(s) that is fixed. There is abnormal wall motion in the defect area. Consistent with infarction. Defect 2: There is a small defect with mild reduction in uptake present in the apical anterior location(s) that is reversible. There is normal wall motion in the defect area. Consistent with ischemia.   Left ventricular function is abnormal. Global function is normal. End diastolic cavity size is mildly enlarged. End systolic cavity size is mildly enlarged.   There is evidence of previous inferior infarct similar to prior study.  There is mild distal anterior/apical ischemia.   EKG:  EKG is not ordered today.  NSR 71bpm, LAD, TWI lateral leads, nonspecific ST changes  Recent Labs: 02/08/2022: ALT 14; BUN 24; Creatinine, Ser 1.15; Hemoglobin 15.3; Platelets 217.0; Potassium 4.8; Pro B Natriuretic peptide (BNP) 62.0; Sodium 136; TSH 1.67  Recent Lipid Panel    Component  Value Date/Time   CHOL 166 10/09/2019 0952   CHOL 97 (L) 09/24/2016 0855   TRIG 138 10/09/2019 0952   HDL 42 10/09/2019 0952   HDL 38 (L) 09/24/2016 0855   CHOLHDL 4.0 10/09/2019 0952   VLDL 28 10/09/2019 0952   LDLCALC 96 10/09/2019 0952   LDLCALC 37 09/24/2016  0855   LDLDIRECT 157.1 09/07/2013 0910    Physical Exam:    VS:  BP 98/60 (BP Location: Right Arm, Patient Position: Sitting, Cuff Size: Normal)   Pulse 71   Ht '5\' 9"'$  (1.753 m)   Wt 250 lb 9.6 oz (113.7 kg)   SpO2 98%   BMI 37.01 kg/m     Wt Readings from Last 3 Encounters:  03/06/22 250 lb 9.6 oz (113.7 kg)  03/02/22 240 lb (108.9 kg)  03/01/22 249 lb 12.8 oz (113.3 kg)     GEN:  Well nourished, well developed in no acute distress HEENT: Normal NECK: No JVD; No carotid bruits LYMPHATICS: No lymphadenopathy CARDIAC: RRR, no murmurs, rubs, gallops RESPIRATORY:  Clear to auscultation without rales, wheezing or rhonchi  ABDOMEN: Soft, non-tender, non-distended MUSCULOSKELETAL:  No edema; No deformity  SKIN: Warm and dry NEUROLOGIC:  Alert and oriented x 3 PSYCHIATRIC:  Normal affect   ASSESSMENT:    1. Abnormal stress test   2. Coronary artery disease involving native coronary artery of native heart without angina pectoris   3. Dyspnea on exertion   4. Essential hypertension    PLAN:    In order of problems listed above:  Abnormal Myoview Lexiscan CAD s/p CABG with subsequent PCI Lexsican findings were consistent with ischemia, prior infarction, abnormal LVEF, overall intermediate risk study. Echo has been ordered. The patient was previously having chest burning, which may have been GERD, but denies any reoccurrence. He has dyspnea on exertion, but overall reports he is fairly active. Based on abnormal stress test and symptoms we will set him up for a LHC. Continue Aspirin, Plavix, Imdur, Repatha, Zetia, SL NTG. We will see him back after the procedure.   DOE Echo has been ordered. Plan for LHC as above.  He appears euvolemic on exam.   HTN BP is soft today. Continue current medications.   Frequent urination He was treated for a UTI. He has been following with PCP, endocrinology and urology.   Disposition: Follow up in 3 week(s) with MD/APP   Shared Decision Making/Informed Consent   Shared Decision Making/Informed Consent The risks [stroke (1 in 1000), death (1 in 1000), kidney failure [usually temporary] (1 in 500), bleeding (1 in 200), allergic reaction [possibly serious] (1 in 200)], benefits (diagnostic support and management of coronary artery disease) and alternatives of a cardiac catheterization were discussed in detail with Mr. Ortega and he is willing to proceed.    Signed, Tyesha Joffe Ninfa Meeker, PA-C  03/06/2022 12:02 PM    [facility]

## 2022-03-06 NOTE — Progress Notes (Signed)
Cardiology Office Note:    Date:  03/06/2022   ID:  Phillip Clements, DOB 1951-02-01, MRN [id number]  PCP:  Tonia Ghent, MD  [facility] Cardiologist:  [name], MD  [name] Electrophysiologist:  None   Referring MD: Tonia Ghent, MD   Chief Complaint: stress test follow-up  History of Present Illness:    Phillip Clements is a 71 y.o. male with a hx of CAD/CABG, s/p PCI to left main-LAD, PAD s/p left SFA angioplasty 2016, former smoker x40+ years, OSA on CPAP, diabetes presenting for follow-up.   He had CABG in September 2015 for severe 3V CAD. EF was 45%. He developed recurrent angina in November 2015. HE underwent nuclear stress test which was abnormal. He had inferolateral STE with exercise. Nuclear imaging showed evidence of inferior and anterior ischemia. Repeath cardiac cath showed occluded grafts, except for SVG to OM2, LIMA to LAD. EF was 55%. He underwent atherectomy and stenting of the left main and LAD. He is intolerance to statins.   Echo 2021 showed normal EF with normal diastolic function  He was seen 02/09/22 for chest pain. Imdur was started and Myoview was ordered. Coreg was held for low BP.  Myoview showed ischemia and prior MI, evidence of ischemia, overall intermediate risk study  Today, the Myovioew was reviewed. He reports he has shortness of breath when he exerts himself. He was having chest burning, but denies any recurrence of this. He is unsure if the chest burning was from GERD. He is taking lasix as needed. Reports he is fairly active. Heart cath was dicussed.   Past Medical History:  Diagnosis Date   Cataract    bilateral eye in 2010   Coronary artery disease    a. CABG 04/2014: (LIMA->LAD, VG->DIAG, VG->OM2, VG->PDA)  b. 07/2014 early graft failure (VG->OM2 60, LIMA->LAD atretic, VG->PDA & VG->D1 100). s/p successful rotational atherectomy & DES of the LM & pLAD (3.0x24 Promus DES) & mLAD (2.25x24 Promus DES).   CVA (cerebral  infarction)    Family history of prostate cancer    Hx of adenomatous colonic polyps 06/17/2017   Hyperlipidemia    a. Statin intolerant.   Hypertension 2000   Moderate mitral regurgitation    a. 02/2015 Echo: EF 55-60%, no rwma, mod MR, mod BAE, mildly dil RV w/ low nl RV fxn.   OSA on CPAP    Peripheral vascular disease ([place])    a. left SFA angioplasty in December 2016   Prostate cancer [facility])    Sleep apnea    wears CPAP nightly   Type II diabetes mellitus [facility])     Past Surgical History:  Procedure Laterality Date   ANTERIOR CERVICAL DECOMP/DISCECTOMY FUSION  08/25/2012   Procedure: ANTERIOR CERVICAL DECOMPRESSION/DISCECTOMY FUSION 2 LEVELS;  Surgeon: Hosie Spangle, MD;  Location: [place] NEURO ORS;  Service: Neurosurgery;  Laterality: N/A;  Cervical five-six Cervical six-seven anterior cervical decompression with fusion and plating and bonegraft   BACK SURGERY     CARDIAC CATHETERIZATION  04/2014; 07/19/2014   CORONARY ARTERY BYPASS GRAFT N/A 04/26/2014   Procedure: CORONARY ARTERY BYPASS GRAFTING (CABG), on pump, times four, using left internal mammary artery, right greater saphenous vein harvested endoscopically.;  Surgeon: Ivin Poot, MD;  Location: [place];  Service: Open Heart Surgery;  Laterality: N/A;  LIMA to LAD, SVG to DIAGONAL, SVG to OM1, SVG to PDA) with EVH of the RIGHT THIGH and LOWER EXTREMITY SAPHENOUS VEIN   Cytoscopy prostatic stone  o/w nml  06/21/08   Dr. Jacqlyn Larsen   ETT myoview  09/13/09   Low risk EF 49%   High intense focused ultrasound  07/20/06   By Dr. Jacqlyn Larsen   INTRAOPERATIVE TRANSESOPHAGEAL ECHOCARDIOGRAM N/A 04/26/2014   Procedure: INTRAOPERATIVE TRANSESOPHAGEAL ECHOCARDIOGRAM;  Surgeon: Ivin Poot, MD;  Location: [place];  Service: Open Heart Surgery;  Laterality: N/A;   PERCUTANEOUS CORONARY ROTOBLATOR INTERVENTION (PCI-R) N/A 07/22/2014   Procedure: PERCUTANEOUS CORONARY ROTOBLATOR INTERVENTION (PCI-R);  Surgeon: Peter M Martinique, MD;  Location: [facility] CATH LAB;   Service: Cardiovascular;  Laterality: N/A;   PERIPHERAL VASCULAR CATHETERIZATION Left 07/26/2015   Procedure: Lower Extremity Angiography;  Surgeon: Katha Cabal, MD;  Location: [place] CV LAB;  Service: Cardiovascular;  Laterality: Left;   PERIPHERAL VASCULAR CATHETERIZATION  07/26/2015   Procedure: Lower Extremity Intervention;  Surgeon: Katha Cabal, MD;  Location: [place] CV LAB;  Service: Cardiovascular;;   PROSTATE BIOPSY  04/03/06 & 02/25/07    Current Medications: Current Meds  Medication Sig   aspirin EC 81 MG tablet Take 1 tablet (81 mg total) by mouth daily.   clopidogrel (PLAVIX) 75 MG tablet TAKE 1 TABLET BY MOUTH ONCE DAILY   Dulaglutide (TRULICITY) 1.5 FB/5.1WC SOPN Inject 1.5 mg into the skin once a week.   ezetimibe (ZETIA) 10 MG tablet Take 1 tablet (10 mg total) by mouth daily.   furosemide (LASIX) 20 MG tablet Take 1 tablet (20 mg total) by mouth as needed.   glucose blood (ONETOUCH ULTRA) test strip CHECK BLOOD SUGAR TWICE A DAY - DX E11.59   insulin degludec (TRESIBA FLEXTOUCH) 200 UNIT/ML FlexTouch Pen Inject 100 Units into the skin daily.   Insulin Pen Needle 32G X 4 MM MISC Use 1x a day   INVOKANA 300 MG TABS tablet TAKE 1 TABLET BY MOUTH ONCE A DAY WITH BREAKFAST   isosorbide mononitrate (IMDUR) 30 MG 24 hr tablet Take 0.5 tablets (15 mg total) by mouth daily.   MYRBETRIQ 50 MG TB24 tablet TAKE 1 TABLET BY MOUTH ONCE A DAY   nitroGLYCERIN (NITROSTAT) 0.4 MG SL tablet Place 1 tablet (0.4 mg total) under the tongue as needed.   REPATHA SURECLICK 585 MG/ML SOAJ INJECT 1 PEN INTO THE SKIN EVERY 14 DAYS   tamsulosin (FLOMAX) 0.4 MG CAPS capsule TAKE 1 CAPSULE BY MOUTH ONCE DAILY AFTERSUPPER   Current Facility-Administered Medications for the 03/06/22 encounter (Office Visit) with Kathlen Mody, Donnice Nielsen H, PA-C  Medication   sodium chloride flush (NS) 0.9 % injection 3 mL     Allergies:   Coreg [carvedilol], Metformin and related, Protonix [pantoprazole  sodium], Rosuvastatin calcium, and Statins   Social History   Socioeconomic History   Marital status: Married    Spouse name: Not on file   Number of children: 3   Years of education: Not on file   Highest education level: Not on file  Occupational History   Occupation: Scientist, 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  Tobacco Use   Smoking status: Former    Packs/day: 1.00    Years: 36.00    Total pack years: 36.00    Types: Cigarettes    Quit date: 03/01/2014    Years since quitting: 8.0   Smokeless tobacco: Never  Vaping Use   Vaping Use: Never used  Substance and Sexual Activity   Alcohol use: Yes    Comment: 07/19/2014 "might have a drink a couple times/yr"   Drug use: No   Sexual activity: Not Currently  Other Topics Concern   Not on file  Social History Narrative   Lives with wife.; 2 daughters and 1 son.[employer] fan; semi-retd; Arboriculturist; Former Education officer, community, 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 In [place].    Social Determinants of Health   Financial Resource Strain: Low Risk  (03/02/2022)   Overall Financial Resource Strain (CARDIA)    Difficulty of Paying Living Expenses: Not hard at all  Food Insecurity: No Food Insecurity (03/02/2022)   Hunger Vital Sign    Worried About Running Out of Food in the Last Year: Never true    Ran Out of Food in the Last Year: Never true  Transportation Needs: No Transportation Needs (03/02/2022)   PRAPARE - Hydrologist (Medical): No    Lack of Transportation (Non-Medical): No  Physical Activity: Sufficiently Active (03/02/2022)   Exercise Vital Sign    Days of Exercise per Week: 7 days    Minutes of Exercise per Session: 90 min  Stress: No Stress Concern Present (03/02/2022)   [place]    Feeling of Stress : Not at all  Social Connections: Not on file     Family  History: The patient's family history includes Coronary artery disease in his father and mother; Diabetes in his brother, father, and sister; Diabetes (age of onset: 36) in his mother; Heart attack in his brother, father, maternal grandfather, and mother; Heart disease in his brother, father, and mother; Hypertension in his father and mother; Prostate cancer (age of onset: 75) in his paternal grandfather. There is no history of Breast cancer, Colon cancer, Depression, Alcohol abuse, or Stroke.  ROS:   Please see the history of present illness.     All other systems reviewed and are negative.  EKGs/Labs/Other Studies Reviewed:    The following studies were reviewed today:  Myoview Lexiscan 02/2021    Findings are consistent with ischemia and prior myocardial infarction. The study is intermediate risk.   No ST deviation was noted.   LV perfusion is abnormal. There is evidence of ischemia. There is evidence of infarction. Defect 1: There is a medium defect with severe reduction in uptake present in the mid to basal inferior location(s) that is fixed. There is abnormal wall motion in the defect area. Consistent with infarction. Defect 2: There is a small defect with mild reduction in uptake present in the apical anterior location(s) that is reversible. There is normal wall motion in the defect area. Consistent with ischemia.   Left ventricular function is abnormal. Global function is normal. End diastolic cavity size is mildly enlarged. End systolic cavity size is mildly enlarged.   There is evidence of previous inferior infarct similar to prior study.  There is mild distal anterior/apical ischemia.   EKG:  EKG is not ordered today.  NSR 71bpm, LAD, TWI lateral leads, nonspecific ST changes  Recent Labs: 02/08/2022: ALT 14; BUN 24; Creatinine, Ser 1.15; Hemoglobin 15.3; Platelets 217.0; Potassium 4.8; Pro B Natriuretic peptide (BNP) 62.0; Sodium 136; TSH 1.67  Recent Lipid Panel    Component  Value Date/Time   CHOL 166 10/09/2019 0952   CHOL 97 (L) 09/24/2016 0855   TRIG 138 10/09/2019 0952   HDL 42 10/09/2019 0952   HDL 38 (L) 09/24/2016 0855   CHOLHDL 4.0 10/09/2019 0952   VLDL 28 10/09/2019 0952   LDLCALC 96 10/09/2019 0952   LDLCALC 37 09/24/2016  0855   LDLDIRECT 157.1 09/07/2013 0910    Physical Exam:    VS:  BP 98/60 (BP Location: Right Arm, Patient Position: Sitting, Cuff Size: Normal)   Pulse 71   Ht '5\' 9"'$  (1.753 m)   Wt 250 lb 9.6 oz (113.7 kg)   SpO2 98%   BMI 37.01 kg/m     Wt Readings from Last 3 Encounters:  03/06/22 250 lb 9.6 oz (113.7 kg)  03/02/22 240 lb (108.9 kg)  03/01/22 249 lb 12.8 oz (113.3 kg)     GEN:  Well nourished, well developed in no acute distress HEENT: Normal NECK: No JVD; No carotid bruits LYMPHATICS: No lymphadenopathy CARDIAC: RRR, no murmurs, rubs, gallops RESPIRATORY:  Clear to auscultation without rales, wheezing or rhonchi  ABDOMEN: Soft, non-tender, non-distended MUSCULOSKELETAL:  No edema; No deformity  SKIN: Warm and dry NEUROLOGIC:  Alert and oriented x 3 PSYCHIATRIC:  Normal affect   ASSESSMENT:    1. Abnormal stress test   2. Coronary artery disease involving native coronary artery of native heart without angina pectoris   3. Dyspnea on exertion   4. Essential hypertension    PLAN:    In order of problems listed above:  Abnormal Myoview Lexiscan CAD s/p CABG with subsequent PCI Lexsican findings were consistent with ischemia, prior infarction, abnormal LVEF, overall intermediate risk study. Echo has been ordered. The patient was previously having chest burning, which may have been GERD, but denies any reoccurrence. He has dyspnea on exertion, but overall reports he is fairly active. Based on abnormal stress test and symptoms we will set him up for a LHC. Continue Aspirin, Plavix, Imdur, Repatha, Zetia, SL NTG. We will see him back after the procedure.   DOE Echo has been ordered. Plan for LHC as above.  He appears euvolemic on exam.   HTN BP is soft today. Continue current medications.   Frequent urination He was treated for a UTI. He has been following with PCP, endocrinology and urology.   Disposition: Follow up in 3 week(s) with MD/APP   Shared Decision Making/Informed Consent   Shared Decision Making/Informed Consent The risks [stroke (1 in 1000), death (1 in 1000), kidney failure [usually temporary] (1 in 500), bleeding (1 in 200), allergic reaction [possibly serious] (1 in 200)], benefits (diagnostic support and management of coronary artery disease) and alternatives of a cardiac catheterization were discussed in detail with Phillip Clements and he is willing to proceed.    Signed, Sherryl Valido Ninfa Meeker, PA-C  03/06/2022 12:02 PM    [facility]

## 2022-03-06 NOTE — Patient Instructions (Signed)
Medication Instructions:   Your physician recommends that you continue on your current medications as directed. Please refer to the Current Medication list given to you today.  *If you need a refill on your cardiac medications before your next appointment, please call your pharmacy*   Lab Work:  Today at the medical mall: CBC, BMET  -  Please go to the [place] Entrance at [place] in at the Registration Desk: 1st desk to the right, past the screening table   If you have labs (blood work) drawn today and your tests are completely normal, you will receive your results only by: [place] (if you have MyChart) OR A paper copy in the mail If you have any lab test that is abnormal or we need to change your treatment, we will call you to review the results.   Testing/Procedures:  You are scheduled for a Cardiac Catheterization on Monday, August 7 with Dr. [name].  1. Please arrive at the [place] at [facility] at 8:30 AM. Check in at the Registration Desk. (This time is one hour before your procedure to ensure your preparation). Free valet parking service is available.   Special note: Every effort is made to have your procedure done on time. Please understand that emergencies sometimes delay scheduled procedures.  2. Diet: Do not eat solid foods after midnight.  You may have clear liquids until 5 AM upon the day of the procedure.  3. Labs: You will need to have blood drawn today at the medical mall per above instructions for lab.   4. Medication instructions in preparation for your procedure:  Do Not take furosemide (Lasix) the morning of procedure.  Take only HALF dose of insulin the night before your procedure. Do not take any insulin on the day of the procedure.  Do Not take any diabetic medication the morning of your procedure: Invokana  On the morning of your procedure, take Aspirin and Plavix/Clopidogrel and any morning medicines NOT listed above.  You  may use sips of water.  5. Plan to go home the same day, you will only stay overnight if medically necessary. 6. You MUST have a responsible adult to drive you home. 7. An adult MUST be with you the first 24 hours after you arrive home. 8. Bring a current list of your medications, and the last time and date medication taken. 9. Bring ID and current insurance cards. 10.Please wear clothes that are easy to get on and off and wear slip-on shoes.  Thank you for allowing [place] to care for you!   -- [facility]    Follow-Up: At [facility], you and your health needs are our priority.  As part of our continuing mission to provide you with exceptional heart care, we have created designated Provider Care Teams.  These Care Teams include your primary Cardiologist (physician) and Advanced Practice Providers (APPs -  Physician Assistants and Nurse Practitioners) who all work together to provide you with the care you need, when you need it.  We recommend signing up for the patient portal called "MyChart".  Sign up information is provided on this After Visit Summary.  MyChart is used to connect with patients for Virtual Visits (Telemedicine).  Patients are able to view lab/test results, encounter notes, upcoming appointments, etc.  Non-urgent messages can be sent to your provider as well.   To learn more about what you can do with MyChart, go to [email or web address].  Your next appointment:    Follow up after cath procedure  The format for your next appointment:   In Person  Provider:   You may see [name], MD or one of the following Advanced Practice Providers on your designated Care Team:   Murray Hodgkins, NP Christell Faith, PA-C Cadence Kathlen Mody, PA-C{   Important Information About Sugar

## 2022-03-07 ENCOUNTER — Encounter: Payer: Self-pay | Admitting: Urology

## 2022-03-07 ENCOUNTER — Ambulatory Visit (INDEPENDENT_AMBULATORY_CARE_PROVIDER_SITE_OTHER): Payer: Medicare Other | Admitting: Urology

## 2022-03-07 VITALS — BP 135/73 | HR 62 | Ht 69.0 in | Wt 245.0 lb

## 2022-03-07 DIAGNOSIS — C61 Malignant neoplasm of prostate: Secondary | ICD-10-CM

## 2022-03-07 DIAGNOSIS — R350 Frequency of micturition: Secondary | ICD-10-CM | POA: Diagnosis not present

## 2022-03-07 DIAGNOSIS — Z8546 Personal history of malignant neoplasm of prostate: Secondary | ICD-10-CM

## 2022-03-07 DIAGNOSIS — I2510 Atherosclerotic heart disease of native coronary artery without angina pectoris: Secondary | ICD-10-CM

## 2022-03-07 DIAGNOSIS — R310 Gross hematuria: Secondary | ICD-10-CM

## 2022-03-07 LAB — URINALYSIS, COMPLETE
Bilirubin, UA: NEGATIVE
Ketones, UA: NEGATIVE
Leukocytes,UA: NEGATIVE
Nitrite, UA: NEGATIVE
Protein,UA: NEGATIVE
RBC, UA: NEGATIVE
Specific Gravity, UA: 1.015 (ref 1.005–1.030)
Urobilinogen, Ur: 0.2 mg/dL (ref 0.2–1.0)
pH, UA: 5 (ref 5.0–7.5)

## 2022-03-07 LAB — MICROSCOPIC EXAMINATION: Bacteria, UA: NONE SEEN

## 2022-03-07 LAB — BLADDER SCAN AMB NON-IMAGING: SCA Result: 0

## 2022-03-07 NOTE — Progress Notes (Signed)
03/07/22 8:53 AM   Phillip Clements [birth year] [id number]  Referring provider:  Tonia Ghent, MD [address] [place],  [place] [address]  Chief Complaint  Patient presents with   Prostate Cancer    Urological history  Prostate adenocarcinoma -  02/2006 PSA returned elevated at 17.49. 04/03/2006; PSA 24.7; prostate biopsy Gleason 3+3 with 4/4 cores -  [place] with [place] [place] late 2007  -  persistent PSA elevation over period 2007-2010; nadir 2.1 09/16/06, peak 15.0 ng/mL  - 04/2006 bone scan and CT abd/pelvis without evidence of metastasis - 03/08/2007 CT abdomen/pelvis with no definitive metastatic disease noted  - 03/18/2007 prostascint scan with no evidence of disease but small pelvic lymph nodes noted  - 08/15/2009 prostascint scan with no evidence of local prostate cancer or distant metastases  -06/02/10 initiation of degarelix  -05/25/10 PET CT (Fluoride) revealed heterogenous areas of FDG-avidity in ribs, femurs, humeri, areas also in the metatarsals noted which may have been arthiritic in nature  m. 07/03/10 PSA 9.6 ng/mL  -  Started Lupron/Casodex - Axumin scan 09/2019 showed tracer avid soft tissue and prostate bed; no evidence of nodal or osseous metastasis -  completed salvage radiation 01/08/2020 - PSA on 12/25/2021 unremarkable  - IPSS 18/4  HPI: Phillip Clements is a 71 y.o.male who presents today for further evaluation of urinary frequency.   He is accompanied by his wife, [name]. He reports has leakage and urinary frequency for about 3-4 years ago. He also has urgency when he feels the urge he has to go. He wakes up 3-4x nightly and he wears a depend that is fully saturated during the night. He wears pads throughout the day.  He has been taking Myrbetriq and tamsulosin over the last year.  He reports blood in his urine off and on over the last year.  He reports that he drinks dark sodas such as and rarely drinks water.  UA 3+ glucose, microscopic unremarkable    PVR 0 mL    IPSS     Row Name 03/07/22 0800         International Prostate Symptom Score   How often have you had the sensation of not emptying your bladder? Not at All     How often have you had to urinate less than every two hours? Almost always     How often have you found you stopped and started again several times when you urinated? Less than 1 in 5 times     How often have you found it difficult to postpone urination? Almost always     How often have you had a weak urinary stream? About half the time     How often have you had to strain to start urination? Less than 1 in 5 times     How many times did you typically get up at night to urinate? 3 Times     Total IPSS Score 18       Quality of Life due to urinary symptoms   If you were to spend the rest of your life with your urinary condition just the way it is now how would you feel about that? Mostly Disatisfied              Score:  1-7 Mild 8-19 Moderate 20-35 Severe   PMH: Past Medical History:  Diagnosis Date   Cataract    bilateral eye in 2010   Coronary artery disease    a. CABG  04/2014: (LIMA->LAD, VG->DIAG, VG->OM2, VG->PDA)  b. 07/2014 early graft failure (VG->OM2 60, LIMA->LAD atretic, VG->PDA & VG->D1 100). s/p successful rotational atherectomy & DES of the LM & pLAD (3.0x24 Promus DES) & mLAD (2.25x24 Promus DES).   CVA (cerebral infarction)    Family history of prostate cancer    Hx of adenomatous colonic polyps 06/17/2017   Hyperlipidemia    a. Statin intolerant.   Hypertension 2000   Moderate mitral regurgitation    a. 02/2015 Echo: EF 55-60%, no rwma, mod MR, mod BAE, mildly dil RV w/ low nl RV fxn.   OSA on CPAP    Peripheral vascular disease ([place])    a. left SFA angioplasty in December 2016   Prostate cancer [facility])    Sleep apnea    wears CPAP nightly   Type II diabetes mellitus [facility])     Surgical History: Past Surgical History:  Procedure Laterality Date   ANTERIOR CERVICAL  DECOMP/DISCECTOMY FUSION  08/25/2012   Procedure: ANTERIOR CERVICAL DECOMPRESSION/DISCECTOMY FUSION 2 LEVELS;  Surgeon: Hosie Spangle, MD;  Location: [facility];  Service: Neurosurgery;  Laterality: N/A;  Cervical five-six Cervical six-seven anterior cervical decompression with fusion and plating and bonegraft   BACK SURGERY     CARDIAC CATHETERIZATION  04/2014; 07/19/2014   CORONARY ARTERY BYPASS GRAFT N/A 04/26/2014   Procedure: CORONARY ARTERY BYPASS GRAFTING (CABG), on pump, times four, using left internal mammary artery, right greater saphenous vein harvested endoscopically.;  Surgeon: Ivin Poot, MD;  Location: [facility];  Service: Open Heart Surgery;  Laterality: N/A;  LIMA to LAD, SVG to DIAGONAL, SVG to OM1, SVG to PDA) with EVH of the RIGHT THIGH and LOWER EXTREMITY SAPHENOUS VEIN   Cytoscopy prostatic stone o/w nml  06/21/08   Dr. Jacqlyn Larsen   ETT myoview  09/13/09   Low risk EF 49%   High intense focused ultrasound  07/20/06   By Dr. Jacqlyn Larsen   INTRAOPERATIVE TRANSESOPHAGEAL ECHOCARDIOGRAM N/A 04/26/2014   Procedure: INTRAOPERATIVE TRANSESOPHAGEAL ECHOCARDIOGRAM;  Surgeon: Ivin Poot, MD;  Location: [facility];  Service: Open Heart Surgery;  Laterality: N/A;   PERCUTANEOUS CORONARY ROTOBLATOR INTERVENTION (PCI-R) N/A 07/22/2014   Procedure: PERCUTANEOUS CORONARY ROTOBLATOR INTERVENTION (PCI-R);  Surgeon: Peter M Martinique, MD;  Location: [facility] CATH LAB;  Service: Cardiovascular;  Laterality: N/A;   PERIPHERAL VASCULAR CATHETERIZATION Left 07/26/2015   Procedure: Lower Extremity Angiography;  Surgeon: Katha Cabal, MD;  Location: [facility] CV LAB;  Service: Cardiovascular;  Laterality: Left;   PERIPHERAL VASCULAR CATHETERIZATION  07/26/2015   Procedure: Lower Extremity Intervention;  Surgeon: Katha Cabal, MD;  Location: [facility] CV LAB;  Service: Cardiovascular;;   PROSTATE BIOPSY  04/03/06 & 02/25/07    Home Medications:  Allergies as of 03/07/2022       Reactions   Coreg  [carvedilol] Other (See Comments)   Fatigue- unclear if this was due to coreg specifically or beta blockers in general.     Metformin And Related Nausea Only   Protonix [pantoprazole Sodium] Diarrhea   Rosuvastatin Calcium    REACTION: JOINT ACHES   Statins    REACTION: JOINT ACHES        Medication List        Accurate as of March 07, 2022  8:53 AM. If you have any questions, ask your nurse or doctor.          aspirin EC 81 MG tablet Take 1 tablet (81 mg total) by mouth daily.   clopidogrel 75  MG tablet Commonly known as: PLAVIX TAKE 1 TABLET BY MOUTH ONCE DAILY   ezetimibe 10 MG tablet Commonly known as: ZETIA Take 1 tablet (10 mg total) by mouth daily.   furosemide 20 MG tablet Commonly known as: LASIX Take 1 tablet (20 mg total) by mouth as needed.   Insulin Pen Needle 32G X 4 MM Misc Use 1x a day   Invokana 300 MG Tabs tablet Generic drug: canagliflozin TAKE 1 TABLET BY MOUTH ONCE A DAY WITH BREAKFAST   isosorbide mononitrate 30 MG 24 hr tablet Commonly known as: IMDUR Take 0.5 tablets (15 mg total) by mouth daily.   losartan 25 MG tablet Commonly known as: COZAAR TAKE 1 TABLET BY MOUTH ONCE A DAY   meclizine 25 MG tablet Commonly known as: ANTIVERT Take 0.5-1 tablets (12.5-25 mg total) by mouth 2 (two) times daily as needed for dizziness or nausea.   Myrbetriq 50 MG Tb24 tablet Generic drug: mirabegron ER TAKE 1 TABLET BY MOUTH ONCE A DAY   nitroGLYCERIN 0.4 MG SL tablet Commonly known as: Nitrostat Place 1 tablet (0.4 mg total) under the tongue as needed.   OneTouch Ultra test strip Generic drug: glucose blood CHECK BLOOD SUGAR TWICE A DAY - DX V89.38   Repatha SureClick 101 MG/ML Soaj Generic drug: Evolocumab INJECT 1 PEN INTO THE SKIN EVERY 14 DAYS   sulfamethoxazole-trimethoprim 800-160 MG tablet Commonly known as: BACTRIM DS Take 1 tablet by mouth 2 (two) times daily.   tamsulosin 0.4 MG Caps capsule Commonly known as:  FLOMAX TAKE 1 CAPSULE BY MOUTH ONCE DAILY AFTERSUPPER   Tresiba FlexTouch 200 UNIT/ML FlexTouch Pen Generic drug: insulin degludec Inject 100 Units into the skin daily.   Trulicity 1.5 BP/1.0CH Sopn Generic drug: Dulaglutide Inject 1.5 mg into the skin once a week.        Allergies:  Allergies  Allergen Reactions   Coreg [Carvedilol] Other (See Comments)    Fatigue- unclear if this was due to coreg specifically or beta blockers in general.     Metformin And Related Nausea Only   Protonix [Pantoprazole Sodium] Diarrhea   Rosuvastatin Calcium     REACTION: JOINT ACHES   Statins     REACTION: JOINT ACHES    Family History: Family History  Problem Relation Age of Onset   Diabetes Mother 78       DM   Coronary artery disease Mother    Hypertension Mother    Heart attack Mother        CABG with MI in 2005   Heart disease Mother        CV   Hypertension Father    Heart attack Father        CABG with Mi around 1997   Coronary artery disease Father    Heart disease Father        CV   Diabetes Father    Heart attack Brother        MI/PTCA   Heart disease Brother        CV   Diabetes Brother    Heart attack Maternal Grandfather        MI   Prostate cancer Paternal Grandfather 69       likely metastatic   Diabetes Sister        DM   Breast cancer Neg Hx        Breast/ovarian/uterine cancer   Colon cancer Neg Hx    Depression Neg Hx    Alcohol  abuse Neg Hx        ETOH/drug abuse   Stroke Neg Hx     Social History:  reports that he quit smoking about 8 years ago. His smoking use included cigarettes. He has a 36.00 pack-year smoking history. He has never used smokeless tobacco. He reports current alcohol use. He reports that he does not use drugs.   Physical Exam: BP 135/73   Pulse 62   Ht '5\' 9"'$  (1.753 m)   Wt 245 lb (111.1 kg)   BMI 36.18 kg/m   Constitutional:  Alert and oriented, No acute distress. HEENT: [place] AT, moist mucus membranes.  Trachea  midline Cardiovascular: No clubbing, cyanosis, or edema. Respiratory: Normal respiratory effort, no increased work of breathing. Neurologic: Grossly intact, no focal deficits, moving all 4 extremities. Psychiatric: Normal mood and affect.  Laboratory Data: Lab Results  Component Value Date   CREATININE 1.00 03/06/2022   Lab Results  Component Value Date   HGBA1C 9.8 (H) 02/08/2022   Component     Latest Ref Rng 03/06/2022  Sodium     135 - 145 mmol/L 138   Potassium     3.5 - 5.1 mmol/L 4.6   Chloride     98 - 111 mmol/L 103   CO2     22 - 32 mmol/L 26   Glucose     70 - 99 mg/dL 163 (H)   BUN     8 - 23 mg/dL 19   Creatinine     0.61 - 1.24 mg/dL 1.00   Total Bilirubin     0.2 - 1.2 mg/dL   Alkaline Phosphatase     39 - 117 U/L   AST     0 - 37 U/L   ALT     0 - 53 U/L   Total Protein     6.0 - 8.3 g/dL   Albumin     3.5 - 5.2 g/dL   Calcium     8.9 - 10.3 mg/dL 9.5   GFR     >60.00 mL/min     Legend: (H) High   Prostatic Specific Antigen  Latest Ref Rng 0.00 - 4.00 ng/mL  12/25/2021 <0.01    Component     Latest Ref Rng 02/08/2022 03/06/2022  WBC     4.0 - 10.5 K/uL 6.6  5.6   RBC     4.22 - 5.81 MIL/uL 5.20  5.26   Hemoglobin     13.0 - 17.0 g/dL 15.3  15.2   HCT     39.0 - 52.0 % 44.9  45.6   MCV     80.0 - 100.0 fL 86.4  86.7   MCHC     30.0 - 36.0 g/dL 34.1  33.3   RDW     11.5 - 15.5 % 13.3  12.7   Platelets     150 - 400 K/uL 217.0  189   Neutrophils     43.0 - 77.0 % 67.7    Lymphocytes     12.0 - 46.0 % 22.5    Monocytes Relative     3.0 - 12.0 % 6.7    Eosinophil     0.0 - 5.0 % 2.6    Basophil     0.0 - 3.0 % 0.5    NEUT#     1.4 - 7.7 K/uL 4.5    Lymphocyte #     0.7 - 4.0 K/uL 1.5    Monocyte #  0.1 - 1.0 K/uL 0.4    Eosinophils Absolute     0.0 - 0.7 K/uL 0.2    Basophils Absolute     0.0 - 0.1 K/uL 0.0    MCH     26.0 - 34.0 pg  28.9   nRBC     0.0 - 0.2 %  0.0   Immature Granulocytes     %    Abs  Immature Granulocytes     0.00 - 0.07 K/uL      Component     Latest Ref Rng 02/08/2022  TSH     0.35 - 5.50 uIU/mL 1.67      Urinalysis Benign 3+ glucose I have reviewed the labs.   Pertinent Imaging: Results for orders placed or performed in visit on 03/07/22  Bladder Scan (Post Void Residual) in office  Result Value Ref Range   SCA Result 0      Assessment & Plan:    Gross hematuria - UA negative for micro hematuria today.  - urine sent for culture - I explained to the patient that there are a number of causes that can be associated with blood in the urine, such as stones, BPH, UTI's, damage to the urinary tract and/or cancer. -at this time, they are in a high risk stratification for hematuria per AUA guidelines due to their age (>60 years) and gross heme  -recommended studies for high risk are CT urogram and cysto - I explained to the patient that a contrast material will be injected into a vein and that in rare instances, an allergic reaction can result and may even life threatening (1:100,000)  The patient denies any allergies to contrast, iodine and/or seafood and is not taking metformin. - Following the imaging study,  I've recommended a cystoscopy. I described how this is performed, typically in an office setting with a flexible cystoscope. We described the risks, benefits, and possible side effects, the most common of which is a minor amount of blood in the urine and/or burning which usually resolves in 24 to 48 hours.   - The patient had the opportunity to ask questions which were answered. Based upon this discussion, the patient is willing to proceed. Therefore, I've ordered: a CT Urogram and cystoscopy. - The patient will return following all of the above for discussion of the results.     2. Urinary frequency  - Continue myrbetriq and tamsulosin. Will reassess once hematuria work-up - Advised to discontinue continuation of diet Dr Samson Frederic as artifical sweeteners are  irritating to the bladder.  - Advised tighter control with diabetes   3. Prostate cancer -recent PSA undetectable - followed by cancer center  Return for CTU report and cysto for gross hematuria.  [facility] [address] [place], [place] [address] [phone]  I, Fernande Boyden, acting as a Education administrator for [facility], PA-C.,have documented all relevant documentation on the behalf of Passion Lavin, PA-C,as directed by  [facility], PA-C while in the presence of [place], PA-C.  I have reviewed the above documentation for accuracy and completeness, and I agree with the above.    Zara Council, PA-C

## 2022-03-11 LAB — CULTURE, URINE COMPREHENSIVE

## 2022-03-12 ENCOUNTER — Telehealth: Payer: Self-pay

## 2022-03-12 MED ORDER — CIPROFLOXACIN HCL 250 MG PO TABS: 250.0000 mg | ORAL_TABLET | Freq: Two times a day (BID) | ORAL | 0 refills | Status: AC

## 2022-03-12 NOTE — Telephone Encounter (Signed)
-----   Message from Nori Riis, PA-C sent at 03/11/2022  8:08 PM EDT ----- Please let Mr. Beaver know that his urine culture was positive for infection and he needs to take Cipro 250 mg, twice daily for seven days.

## 2022-03-12 NOTE — Telephone Encounter (Signed)
Notified pt as advised, sent Abx Rx to pharmacy, pt expressed understanding.

## 2022-03-14 ENCOUNTER — Telehealth: Payer: Self-pay

## 2022-03-14 NOTE — Telephone Encounter (Signed)
Inbound fax from DME supplier requesting form be completed and faxed with clinical notes. DME supplies ordered via [employer] through online portal.  

## 2022-03-15 ENCOUNTER — Ambulatory Visit (INDEPENDENT_AMBULATORY_CARE_PROVIDER_SITE_OTHER): Payer: Medicare Other

## 2022-03-15 DIAGNOSIS — R072 Precordial pain: Secondary | ICD-10-CM

## 2022-03-15 LAB — ECHOCARDIOGRAM COMPLETE
AR max vel: 2.02 cm2
AV Area VTI: 2.15 cm2
AV Area mean vel: 1.93 cm2
AV Mean grad: 8.5 mmHg
AV Peak grad: 15.6 mmHg
Ao pk vel: 1.98 m/s
Area-P 1/2: 2.76 cm2
S' Lateral: 3.7 cm

## 2022-03-15 MED ORDER — PERFLUTREN LIPID MICROSPHERE
1.0000 mL | INTRAVENOUS | Status: AC | PRN
  Administered 2022-03-15: 2 mL via INTRAVENOUS

## 2022-03-17 ENCOUNTER — Other Ambulatory Visit: Payer: Self-pay | Admitting: Internal Medicine

## 2022-03-19 ENCOUNTER — Encounter: Payer: Self-pay | Admitting: Cardiovascular Disease

## 2022-03-19 ENCOUNTER — Other Ambulatory Visit: Payer: Self-pay

## 2022-03-19 ENCOUNTER — Encounter
Admission: RE | Disposition: A | Discharge status: Home / Self Care | Payer: Self-pay | Source: Home / Self Care | Attending: Cardiovascular Disease

## 2022-03-19 ENCOUNTER — Ambulatory Visit
Admission: RE | Admit: 2022-03-19 | Discharge: 2022-03-19 | Disposition: A | Discharge status: Home / Self Care | Payer: Medicare Other | Attending: Cardiovascular Disease | Admitting: Cardiovascular Disease

## 2022-03-19 DIAGNOSIS — I25708 Atherosclerosis of coronary artery bypass graft(s), unspecified, with other forms of angina pectoris: Secondary | ICD-10-CM | POA: Insufficient documentation

## 2022-03-19 DIAGNOSIS — I2582 Chronic total occlusion of coronary artery: Secondary | ICD-10-CM | POA: Diagnosis not present

## 2022-03-19 DIAGNOSIS — I10 Essential (primary) hypertension: Secondary | ICD-10-CM | POA: Insufficient documentation

## 2022-03-19 DIAGNOSIS — Z7902 Long term (current) use of antithrombotics/antiplatelets: Secondary | ICD-10-CM | POA: Insufficient documentation

## 2022-03-19 DIAGNOSIS — Z79899 Other long term (current) drug therapy: Secondary | ICD-10-CM | POA: Insufficient documentation

## 2022-03-19 DIAGNOSIS — Z87891 Personal history of nicotine dependence: Secondary | ICD-10-CM | POA: Insufficient documentation

## 2022-03-19 DIAGNOSIS — G4733 Obstructive sleep apnea (adult) (pediatric): Secondary | ICD-10-CM | POA: Diagnosis not present

## 2022-03-19 DIAGNOSIS — Z7982 Long term (current) use of aspirin: Secondary | ICD-10-CM | POA: Diagnosis not present

## 2022-03-19 DIAGNOSIS — R9430 Abnormal result of cardiovascular function study, unspecified: Secondary | ICD-10-CM | POA: Insufficient documentation

## 2022-03-19 DIAGNOSIS — I25118 Atherosclerotic heart disease of native coronary artery with other forms of angina pectoris: Secondary | ICD-10-CM

## 2022-03-19 DIAGNOSIS — R9439 Abnormal result of other cardiovascular function study: Secondary | ICD-10-CM

## 2022-03-19 DIAGNOSIS — R0609 Other forms of dyspnea: Secondary | ICD-10-CM | POA: Insufficient documentation

## 2022-03-19 DIAGNOSIS — E1151 Type 2 diabetes mellitus with diabetic peripheral angiopathy without gangrene: Secondary | ICD-10-CM | POA: Insufficient documentation

## 2022-03-19 HISTORY — PX: LEFT HEART CATH AND CORONARY ANGIOGRAPHY: CATH118249

## 2022-03-19 LAB — GLUCOSE, CAPILLARY
Glucose-Capillary: 148 mg/dL — ABNORMAL HIGH (ref 70–99)
Glucose-Capillary: 128 mg/dL — ABNORMAL HIGH (ref 70–99)

## 2022-03-19 SURGERY — LEFT HEART CATH AND CORONARY ANGIOGRAPHY
Anesthesia: Moderate Sedation | Laterality: Left

## 2022-03-19 MED ORDER — SODIUM CHLORIDE 0.9 % IV SOLN: 250.0000 mL | INTRAVENOUS | Status: DC | PRN

## 2022-03-19 MED ORDER — HEPARIN (PORCINE) IN NACL 1000-0.9 UT/500ML-% IV SOLN
INTRAVENOUS | Status: AC
  Filled 2022-03-19: qty 1000

## 2022-03-19 MED ORDER — LIDOCAINE HCL (PF) 1 % IJ SOLN
INTRAMUSCULAR | Status: DC | PRN
  Administered 2022-03-19: 10 mL

## 2022-03-19 MED ORDER — SODIUM CHLORIDE 0.9% FLUSH: 3.0000 mL | INTRAVENOUS | Status: DC | PRN

## 2022-03-19 MED ORDER — FENTANYL CITRATE (PF) 100 MCG/2ML IJ SOLN
INTRAMUSCULAR | Status: DC | PRN
Start: 2022-03-19 — End: 2022-03-19
  Administered 2022-03-19: 50 ug via INTRAVENOUS

## 2022-03-19 MED ORDER — FENTANYL CITRATE (PF) 100 MCG/2ML IJ SOLN
INTRAMUSCULAR | Status: AC
  Filled 2022-03-19: qty 2

## 2022-03-19 MED ORDER — ONDANSETRON HCL 4 MG/2ML IJ SOLN: 4.0000 mg | Freq: Four times a day (QID) | INTRAMUSCULAR | Status: DC | PRN

## 2022-03-19 MED ORDER — SODIUM CHLORIDE 0.9 % IV SOLN: INTRAVENOUS | Status: DC

## 2022-03-19 MED ORDER — SODIUM CHLORIDE 0.9 % WEIGHT BASED INFUSION
1.0000 mL/kg/h | INTRAVENOUS | Status: DC
  Administered 2022-03-19: 1 mL/kg/h via INTRAVENOUS

## 2022-03-19 MED ORDER — MIDAZOLAM HCL 2 MG/2ML IJ SOLN
INTRAMUSCULAR | Status: DC | PRN
  Administered 2022-03-19: 1 mg via INTRAVENOUS

## 2022-03-19 MED ORDER — SODIUM CHLORIDE 0.9 % WEIGHT BASED INFUSION
3.0000 mL/kg/h | INTRAVENOUS | Status: AC
  Administered 2022-03-19: 3 mL/kg/h via INTRAVENOUS

## 2022-03-19 MED ORDER — IOHEXOL 300 MG/ML  SOLN
INTRAMUSCULAR | Status: DC | PRN
  Administered 2022-03-19: 110 mL

## 2022-03-19 MED ORDER — SODIUM CHLORIDE 0.9% FLUSH
3.0000 mL | Freq: Two times a day (BID) | INTRAVENOUS | Status: DC
Start: 2022-03-19 — End: 2022-03-19

## 2022-03-19 MED ORDER — ASPIRIN 81 MG PO CHEW
81.0000 mg | CHEWABLE_TABLET | ORAL | Status: AC
  Administered 2022-03-19: 81 mg via ORAL

## 2022-03-19 MED ORDER — HEPARIN (PORCINE) IN NACL 1000-0.9 UT/500ML-% IV SOLN
INTRAVENOUS | Status: DC | PRN
  Administered 2022-03-19: 500 mL

## 2022-03-19 MED ORDER — ACETAMINOPHEN 325 MG PO TABS: 650.0000 mg | ORAL_TABLET | ORAL | Status: DC | PRN

## 2022-03-19 MED ORDER — ASPIRIN 81 MG PO CHEW
CHEWABLE_TABLET | ORAL | Status: AC
  Filled 2022-03-19: qty 1

## 2022-03-19 MED ORDER — LIDOCAINE HCL 1 % IJ SOLN
INTRAMUSCULAR | Status: AC
  Filled 2022-03-19: qty 20

## 2022-03-19 MED ORDER — MIDAZOLAM HCL 2 MG/2ML IJ SOLN
INTRAMUSCULAR | Status: AC
  Filled 2022-03-19: qty 2

## 2022-03-19 SURGICAL SUPPLY — 10 items
CANNULA 5F STIFF (CANNULA) ×1 IMPLANT
CATH INFINITI 5 FR 3DRC (CATHETERS) ×1 IMPLANT
CATH INFINITI 5FR MULTPACK ANG (CATHETERS) ×1 IMPLANT
DEVICE CLOSURE MYNXGRIP 5F (Vascular Products) ×1 IMPLANT
PACK CARDIAC CATH (CUSTOM PROCEDURE TRAY) ×2 IMPLANT
PROTECTION STATION PRESSURIZED (MISCELLANEOUS) ×2
SET ATX SIMPLICITY (MISCELLANEOUS) ×1 IMPLANT
SHEATH AVANTI 5FR X 11CM (SHEATH) ×1 IMPLANT
STATION PROTECTION PRESSURIZED (MISCELLANEOUS) IMPLANT
WIRE GUIDERIGHT .035X150 (WIRE) ×1 IMPLANT

## 2022-03-19 NOTE — Interval H&P Note (Signed)
Cath Lab Visit (complete for each Cath Lab visit)  Clinical Evaluation Leading to the Procedure:   ACS: No.  Non-ACS:    Anginal Classification: CCS II  Anti-ischemic medical therapy: Minimal Therapy (1 class of medications)  Non-Invasive Test Results: Intermediate-risk stress test findings: cardiac mortality 1-3%/year  Prior CABG: Previous CABG      History and Physical Interval Note:  03/19/2022 9:55 AM  Phillip Clements  has presented today for surgery, with the diagnosis of LT Cath   Abnormal stress test   Dyspnea on exertion.  The various methods of treatment have been discussed with the patient and family. After consideration of risks, benefits and other options for treatment, the patient has consented to  Procedure(s): LEFT HEART CATH AND CORONARY ANGIOGRAPHY (Left) as a surgical intervention.  The patient's history has been reviewed, patient examined, no change in status, stable for surgery.  I have reviewed the patient's chart and labs.  Questions were answered to the patient's satisfaction.     [name]

## 2022-03-20 ENCOUNTER — Encounter: Payer: Self-pay | Admitting: Cardiovascular Disease

## 2022-03-20 ENCOUNTER — Ambulatory Visit: Payer: Medicare Other | Admitting: Medical

## 2022-03-22 ENCOUNTER — Encounter: Payer: Self-pay | Admitting: Urology

## 2022-03-22 ENCOUNTER — Ambulatory Visit (INDEPENDENT_AMBULATORY_CARE_PROVIDER_SITE_OTHER): Payer: Medicare Other | Admitting: Urology

## 2022-03-22 ENCOUNTER — Other Ambulatory Visit: Payer: Self-pay

## 2022-03-22 VITALS — BP 167/70 | HR 84 | Ht 70.0 in | Wt 245.0 lb

## 2022-03-22 DIAGNOSIS — Z8546 Personal history of malignant neoplasm of prostate: Secondary | ICD-10-CM

## 2022-03-22 DIAGNOSIS — C61 Malignant neoplasm of prostate: Secondary | ICD-10-CM

## 2022-03-22 DIAGNOSIS — R310 Gross hematuria: Secondary | ICD-10-CM

## 2022-03-22 LAB — URINALYSIS, COMPLETE
Bilirubin, UA: NEGATIVE
Ketones, UA: NEGATIVE
Leukocytes,UA: NEGATIVE
Nitrite, UA: NEGATIVE
Protein,UA: NEGATIVE
RBC, UA: NEGATIVE
Specific Gravity, UA: 1.01 (ref 1.005–1.030)
Urobilinogen, Ur: 0.2 mg/dL (ref 0.2–1.0)
pH, UA: 5 (ref 5.0–7.5)

## 2022-03-22 LAB — MICROSCOPIC EXAMINATION

## 2022-03-22 MED ORDER — FREESTYLE LIBRE 2 SENSOR MISC: 3 refills | Status: AC

## 2022-03-22 NOTE — Progress Notes (Signed)
   03/22/22  CC:  Chief Complaint  Patient presents with   Cysto    HPI: Refer to [facility]'s note of 03/07/2022.  Denies recurrent gross hematuria.  CTU was ordered but not yet scheduled  Blood pressure (!) 167/70, pulse 84, height '5\' 10"'$  (1.778 m), weight 245 lb (111.1 kg).   Cystoscopy Procedure Note  Patient identification was confirmed, informed consent was obtained, and patient was prepped using Betadine solution.  Lidocaine jelly was administered per urethral meatus.     Pre-Procedure: - Inspection reveals a normal caliber urethral meatus.  Procedure: The flexible cystoscope was introduced without difficulty - No urethral strictures/lesions are present. -  Prostatic urethra open with hypervascularity, shaggy mucosa posterior urethra without discrete mass   -  Open  bladder neck - Bilateral ureteral orifices identified - Bladder mucosa  reveals no ulcers, tumors, or lesions - No bladder stones - No trabeculation  Retroflexion shows no abnormalities   Post-Procedure: - Patient tolerated the procedure well  Assessment/ Plan: No significant abnormalities noted on cystoscopy Positive urine cultures at the time of hematuria CTU has not been performed Will check on scheduling     Abbie Sons, MD

## 2022-03-26 ENCOUNTER — Telehealth: Payer: Self-pay | Admitting: Family Medicine

## 2022-03-26 ENCOUNTER — Encounter: Payer: Self-pay | Admitting: Urology

## 2022-03-26 ENCOUNTER — Encounter: Payer: Self-pay | Admitting: Internal Medicine

## 2022-03-26 DIAGNOSIS — E1165 Type 2 diabetes mellitus with hyperglycemia: Secondary | ICD-10-CM

## 2022-03-26 DIAGNOSIS — E1159 Type 2 diabetes mellitus with other circulatory complications: Secondary | ICD-10-CM

## 2022-03-26 LAB — CYTOLOGY - NON PAP

## 2022-03-26 MED ORDER — TRULICITY 1.5 MG/0.5ML ~~LOC~~ SOAJ: 1.5000 mg | SUBCUTANEOUS | 11 refills | Status: AC

## 2022-03-26 NOTE — Telephone Encounter (Signed)
Patient called and states since he had the cystoscopy he has had extreme urinary incontinence. An appointment has been scheduled. Patient is aware he will need to leave a urine sample. I explained if it will be easier for him to come get a cup to collect the urine before he comes to the appointment he can. Patient voiced understanding.

## 2022-03-27 ENCOUNTER — Inpatient Hospital Stay: Payer: Medicare Other | Attending: Internal Medicine

## 2022-03-27 ENCOUNTER — Inpatient Hospital Stay (HOSPITAL_BASED_OUTPATIENT_CLINIC_OR_DEPARTMENT_OTHER): Payer: Medicare Other | Admitting: Internal Medicine

## 2022-03-27 ENCOUNTER — Encounter: Payer: Self-pay | Admitting: *Deleted

## 2022-03-27 ENCOUNTER — Encounter: Payer: Self-pay | Admitting: Internal Medicine

## 2022-03-27 DIAGNOSIS — C7951 Secondary malignant neoplasm of bone: Secondary | ICD-10-CM | POA: Insufficient documentation

## 2022-03-27 DIAGNOSIS — C61 Malignant neoplasm of prostate: Secondary | ICD-10-CM | POA: Insufficient documentation

## 2022-03-27 DIAGNOSIS — E785 Hyperlipidemia, unspecified: Secondary | ICD-10-CM | POA: Insufficient documentation

## 2022-03-27 DIAGNOSIS — R350 Frequency of micturition: Secondary | ICD-10-CM | POA: Diagnosis not present

## 2022-03-27 DIAGNOSIS — E559 Vitamin D deficiency, unspecified: Secondary | ICD-10-CM

## 2022-03-27 DIAGNOSIS — I2510 Atherosclerotic heart disease of native coronary artery without angina pectoris: Secondary | ICD-10-CM

## 2022-03-27 LAB — COMPREHENSIVE METABOLIC PANEL
ALT: 29 U/L (ref 0–44)
AST: 22 U/L (ref 15–41)
Albumin: 3.6 g/dL (ref 3.5–5.0)
Alkaline Phosphatase: 54 U/L (ref 38–126)
Anion gap: 8 (ref 5–15)
BUN: 22 mg/dL (ref 8–23)
CO2: 24 mmol/L (ref 22–32)
Calcium: 8.3 mg/dL — ABNORMAL LOW (ref 8.9–10.3)
Chloride: 101 mmol/L (ref 98–111)
Creatinine, Ser: 0.98 mg/dL (ref 0.61–1.24)
GFR, Estimated: >60 mL/min (ref >60)
Glucose, Bld: 188 mg/dL — ABNORMAL HIGH (ref 70–99)
Potassium: 4.1 mmol/L (ref 3.5–5.1)
Sodium: 133 mmol/L — ABNORMAL LOW (ref 135–145)
Total Bilirubin: 1.9 mg/dL — ABNORMAL HIGH (ref 0.3–1.2)
Total Protein: 6.8 g/dL (ref 6.5–8.1)

## 2022-03-27 LAB — CBC WITH DIFFERENTIAL/PLATELET
Abs Immature Granulocytes: 0.01 10*3/uL (ref 0.00–0.07)
Basophils Absolute: 0 10*3/uL (ref 0.0–0.1)
Basophils Relative: 1 %
Eosinophils Absolute: 0 10*3/uL (ref 0.0–0.5)
Eosinophils Relative: 1 %
HCT: 40.2 % (ref 39.0–52.0)
Hemoglobin: 13.7 g/dL (ref 13.0–17.0)
Immature Granulocytes: 0 %
Lymphocytes Relative: 39 %
Lymphs Abs: 1.6 10*3/uL (ref 0.7–4.0)
MCH: 29.8 pg (ref 26.0–34.0)
MCHC: 34.1 g/dL (ref 30.0–36.0)
MCV: 87.6 fL (ref 80.0–100.0)
Monocytes Absolute: 0.5 10*3/uL (ref 0.1–1.0)
Monocytes Relative: 12 %
Neutro Abs: 1.9 10*3/uL (ref 1.7–7.7)
Neutrophils Relative %: 47 %
Platelets: 134 10*3/uL — ABNORMAL LOW (ref 150–400)
RBC: 4.59 MIL/uL (ref 4.22–5.81)
RDW: 13.3 % (ref 11.5–15.5)
Smear Review: NORMAL
WBC: 4 10*3/uL (ref 4.0–10.5)
nRBC: 0 % (ref 0.0–0.2)

## 2022-03-27 LAB — LIPID PANEL
Cholesterol: 107 mg/dL (ref 0–200)
HDL: 27 mg/dL — ABNORMAL LOW (ref >40)
LDL Cholesterol: 49 mg/dL (ref 0–99)
Total CHOL/HDL Ratio: 4 RATIO
Triglycerides: 156 mg/dL — ABNORMAL HIGH (ref <150)
VLDL: 31 mg/dL (ref 0–40)

## 2022-03-27 LAB — PSA: Prostatic Specific Antigen: <0.01 ng/mL (ref 0.00–4.00)

## 2022-03-27 LAB — VITAMIN D 25 HYDROXY (VIT D DEFICIENCY, FRACTURES): Vit D, 25-Hydroxy: 24.47 ng/mL — ABNORMAL LOW (ref 30–100)

## 2022-03-27 NOTE — Assessment & Plan Note (Addendum)
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 S/P- EBRT to prostate [last Tx- on 01/08/2020]. PET Oct 10th, 2021-Mild asymmetric activity within the prostate gland decreased from comparison from Feb 2021; No convincing evidence of nodal metastasis. No visceral metastasis or skeletal metastasis. Currently off- Darolutamide [sec to poor tolerance].   April 2023-- <0.01.   #Given patient's preference/side effects- overall steady PSA less than 0.01. Last eligard May 2022- Eligard [Dr.Stoioff]-continue intermittent ADT.  Continue to hold off on eligard  at this time.  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 HOLD off zometa.   #Hypocalcemia-calcium 8.3.  Recommend gummies calcium plus vitamin D; 1000 units once a day. Awaiting vit D levels.   #Increased frequency of urination-status post work-up with urology/defer to PCP.  # DM-2 on insulin/OHA- BG 170 [PBF; Dr.Gherge]- STABLE.   my chart # DISPOSITION:  #  Follow up in 3 months; MD- labs- cbc/cmp/PSA;  Dr.B

## 2022-03-27 NOTE — Progress Notes (Signed)
Patient has urinary incontinence (followed by urology) and decrease in energy.

## 2022-03-27 NOTE — Progress Notes (Signed)
[facility] CONSULT NOTE  Patient Care Team: Tonia Ghent, MD as PCP - General (Family Medicine) Wellington Hampshire, MD as PCP - Cardiology (Cardiology)  CHIEF COMPLAINTS/PURPOSE OF CONSULTATION: Prostate cancer  #  Oncology History Overview Note  1. Prostate adenocarcinoma  a. 02/2006 PSA returned elevated at 17.49  b. 04/03/2006; PSA 24.7; prostate biopsy Gleason 3+3 with 4/4 cores c. tx with HIFU overseas late 2007 in [place] [Dr.Cope]] d. persistent PSA elevation over period 2007-2010; nadir 2.1 09/16/06, peak 15.0 ng/mL  e. 04/2006 bone scan and CT abd/pelvis without evidence of metastasis f. 03/08/2007 CT abdomen/pelvis with no definitive metastatic disease noted  g. 03/18/2007 prostascint scan with no evidence of disease but small pelvic lymph nodes noted  h. 08/15/2009 prostascint scan with no evidence of local prostate cancer or distant metastases  i. 08/22/2009 PSA 15 ng/mL; 02/06/2010 PSA 20.5 ng/mL  j. 03/08/2010 PSA 20.9 ng/mL  k. 06/02/10 initiation of degarelix  l. 05/25/10 PET CT (Fluoride) revealed heterogenous areas of FDG-avidity in ribs, femurs, humeri, areas also in the metatarsals noted which may have been arthiritic in nature  m. 07/03/10 PSA 9.6 ng/mL  o.  Started Lupron/Casodex; June 2020-PSA from 0.2 to 0.3 -I;  As per patient-Casodex was withdrawn-but noted to have a jump of PSA to 0.9. FEB 2021-evidence of prostatic malignancy in the residual prostate bed; no distant metastatic disease   # FEB 2021-PET scan uptake in prostate; no bone mets.-EBRT to prostate [finished end of May 2021]; October 2021 PET scan improved prostate uptake; no evidence metastatic disease.  # EARLY NOV-apalutamide 3pills [pt pref]; STOPPED prior to christmas, 2021 [fatigue/?diarrhea]  #comorbidities-CAD s/p CABG; diabetes; PVD  # Prostate cancer-at age of 3 s/p-genetic counseling-NEG.   # NGS/MOLECULAR TESTS:NA  # PALLIATIVE CARE EVALUATION: NA  # PAIN MANAGEMENT:  NA   DIAGNOSIS: [name]  STAGE:    4     ;  GOALS: Palliative/control  CURRENT/MOST RECENT THERAPY :     Prostate cancer ([place])  09/16/2007 Initial Diagnosis   Prostate cancer ([place])   01/22/2020 Genetic Testing   Negative genetic testing:  No pathogenic variants detected on the [facility] Multi-Cancer panel. The report date is 01/22/2020.   The Common Hereditary Cancers Panel offered by [facility] includes sequencing and/or deletion duplication testing of the following 48 genes: APC, ATM, AXIN2, BARD1, BMPR1A, BRCA1, BRCA2, BRIP1, CDH1, CDK4, CDKN2A (p14ARF), CDKN2A (p16INK4a), CHEK2, CTNNA1, DICER1, EPCAM (Deletion/duplication testing only), GREM1 (promoter region deletion/duplication testing only), KIT, MEN1, MLH1, MSH2, MSH3, MSH6, MUTYH, NBN, NF1, NTHL1, PALB2, PDGFRA, PMS2, POLD1, POLE, PTEN, RAD50, RAD51C, RAD51D, RNF43, SDHB, SDHC, SDHD, SMAD4, SMARCA4. STK11, TP53, TSC1, TSC2, and VHL.  The following genes were evaluated for sequence changes only: SDHA and HOXB13 c.251G>A variant only.     HISTORY OF PRESENTING ILLNESS: Patient is ambulating independently.  Accompanied by his wife.  Franchot Mimes 71 y.o.  male with history of castrate resistant prostate cancer with Hx of  metastatic to bone/intermittent Eligard-is here for follow-up.  Patient is on intermittent ADT given his preference/side effects from [place] [Dr.Stoioff].  Last Eligard was approximately year ago.  In the interim patient has been evaluated PCP cardiology and urology for increased frequency of urination.  Patient also had treatment with antibiotics for UTI.  Patient also had a cystoscopy.  Notes have improvement of his urination however still not at baseline.   Mild to moderate fatigue. Patient denies any worsening shortness of breath or cough.  Denies any chills or fevers.     Review of Systems  Constitutional:  Positive for malaise/fatigue. Negative for chills, diaphoresis,  fever and weight loss.  HENT:  Negative for nosebleeds and sore throat.   Eyes:  Negative for double vision.  Respiratory:  Negative for cough, hemoptysis, sputum production, shortness of breath and wheezing.   Cardiovascular:  Negative for chest pain, palpitations, orthopnea and leg swelling.  Gastrointestinal:  Negative for abdominal pain, blood in stool, constipation, diarrhea, heartburn, melena, nausea and vomiting.  Genitourinary:  Positive for frequency and urgency.  Musculoskeletal:  Positive for back pain. Negative for joint pain.  Skin: Negative.  Negative for itching and rash.  Neurological:  Negative for dizziness, tingling, focal weakness, weakness and headaches.  Endo/Heme/Allergies:  Does not bruise/bleed easily.  Psychiatric/Behavioral:  Negative for depression. The patient is not nervous/anxious and does not have insomnia.      MEDICAL HISTORY:  Past Medical History:  Diagnosis Date   Cataract    bilateral eye in 2010   Coronary artery disease    a. CABG 04/2014: (LIMA->LAD, VG->DIAG, VG->OM2, VG->PDA)  b. 07/2014 early graft failure (VG->OM2 60, LIMA->LAD atretic, VG->PDA & VG->D1 100). s/p successful rotational atherectomy & DES of the LM & pLAD (3.0x24 Promus DES) & mLAD (2.25x24 Promus DES).   CVA (cerebral infarction)    Family history of prostate cancer    Hx of adenomatous colonic polyps 06/17/2017   Hyperlipidemia    a. Statin intolerant.   Hypertension 2000   Moderate mitral regurgitation    a. 02/2015 Echo: EF 55-60%, no rwma, mod MR, mod BAE, mildly dil RV w/ low nl RV fxn.   OSA on CPAP    Peripheral vascular disease ([place])    a. left SFA angioplasty in December 2016   Prostate cancer [facility])    Sleep apnea    wears CPAP nightly   Type II diabetes mellitus ([place])     SURGICAL HISTORY: Past Surgical History:  Procedure Laterality Date   ANTERIOR CERVICAL DECOMP/DISCECTOMY FUSION  08/25/2012   Procedure: ANTERIOR CERVICAL DECOMPRESSION/DISCECTOMY FUSION 2  LEVELS;  Surgeon: Hosie Spangle, MD;  Location: [facility] NEURO ORS;  Service: Neurosurgery;  Laterality: N/A;  Cervical five-six Cervical six-seven anterior cervical decompression with fusion and plating and bonegraft   BACK SURGERY     CARDIAC CATHETERIZATION  04/2014; 07/19/2014   CORONARY ARTERY BYPASS GRAFT N/A 04/26/2014   Procedure: CORONARY ARTERY BYPASS GRAFTING (CABG), on pump, times four, using left internal mammary artery, right greater saphenous vein harvested endoscopically.;  Surgeon: Ivin Poot, MD;  Location: [facility];  Service: Open Heart Surgery;  Laterality: N/A;  LIMA to LAD, SVG to DIAGONAL, SVG to OM1, SVG to PDA) with EVH of the RIGHT THIGH and LOWER EXTREMITY SAPHENOUS VEIN   Cytoscopy prostatic stone o/w nml  06/21/08   Dr. Jacqlyn Larsen   ETT myoview  09/13/09   Low risk EF 49%   High intense focused ultrasound  07/20/06   By Dr. Jacqlyn Larsen   INTRAOPERATIVE TRANSESOPHAGEAL ECHOCARDIOGRAM N/A 04/26/2014   Procedure: INTRAOPERATIVE TRANSESOPHAGEAL ECHOCARDIOGRAM;  Surgeon: Ivin Poot, MD;  Location: [facility];  Service: Open Heart Surgery;  Laterality: N/A;   LEFT HEART CATH AND CORONARY ANGIOGRAPHY Left 03/19/2022   Procedure: LEFT HEART CATH AND CORONARY ANGIOGRAPHY;  Surgeon: Wellington Hampshire, MD;  Location: [facility] CV LAB;  Service: Cardiovascular;  Laterality: Left;   PERCUTANEOUS CORONARY ROTOBLATOR INTERVENTION (PCI-R) N/A 07/22/2014   Procedure: PERCUTANEOUS CORONARY ROTOBLATOR INTERVENTION (  PCI-R);  Surgeon: Peter M Martinique, MD;  Location: [facility] CATH LAB;  Service: Cardiovascular;  Laterality: N/A;   PERIPHERAL VASCULAR CATHETERIZATION Left 07/26/2015   Procedure: Lower Extremity Angiography;  Surgeon: Katha Cabal, MD;  Location: [facility] CV LAB;  Service: Cardiovascular;  Laterality: Left;   PERIPHERAL VASCULAR CATHETERIZATION  07/26/2015   Procedure: Lower Extremity Intervention;  Surgeon: Katha Cabal, MD;  Location: [facility] CV LAB;  Service: Cardiovascular;;    PROSTATE BIOPSY  04/03/06 & 02/25/07    SOCIAL HISTORY: Social History   Socioeconomic History   Marital status: Married    Spouse name: Not on file   Number of children: 3   Years of education: Not on file   Highest education level: Not on file  Occupational History   Occupation: Scientist, 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  Tobacco Use   Smoking status: Former    Packs/day: 1.00    Years: 36.00    Total pack years: 36.00    Types: Cigarettes    Quit date: 03/01/2014    Years since quitting: 8.0   Smokeless tobacco: Never  Vaping Use   Vaping Use: Never used  Substance and Sexual Activity   Alcohol use: Yes    Comment: 07/19/2014 "might have a drink a couple times/yr"   Drug use: No   Sexual activity: Not Currently  Other Topics Concern   Not on file  Social History Narrative   Lives with wife.; 2 daughters and 1 son.[employer] fan; semi-retd; Arboriculturist; Former Education officer, community, 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 In [place].    Social Determinants of Health   Financial Resource Strain: Low Risk  (03/02/2022)   Overall Financial Resource Strain (CARDIA)    Difficulty of Paying Living Expenses: Not hard at all  Food Insecurity: No Food Insecurity (03/02/2022)   Hunger Vital Sign    Worried About Running Out of Food in the Last Year: Never true    Ran Out of Food in the Last Year: Never true  Transportation Needs: No Transportation Needs (03/02/2022)   PRAPARE - Hydrologist (Medical): No    Lack of Transportation (Non-Medical): No  Physical Activity: Sufficiently Active (03/02/2022)   Exercise Vital Sign    Days of Exercise per Week: 7 days    Minutes of Exercise per Session: 90 min  Stress: No Stress Concern Present (03/02/2022)   [place]    Feeling of Stress : Not at all  Social Connections: Not on file   Intimate Partner Violence: Not on file    FAMILY HISTORY: Family History  Problem Relation Age of Onset   Diabetes Mother 34       DM   Coronary artery disease Mother    Hypertension Mother    Heart attack Mother        CABG with MI in 2005   Heart disease Mother        CV   Hypertension Father    Heart attack Father        CABG with Mi around 1997   Coronary artery disease Father    Heart disease Father        CV   Diabetes Father    Heart attack Brother        MI/PTCA   Heart disease Brother        CV   Diabetes Brother  Heart attack Maternal Grandfather        MI   Prostate cancer Paternal Grandfather 85       likely metastatic   Diabetes Sister        DM   Breast cancer Neg Hx        Breast/ovarian/uterine cancer   Colon cancer Neg Hx    Depression Neg Hx    Alcohol abuse Neg Hx        ETOH/drug abuse   Stroke Neg Hx     ALLERGIES:  is allergic to coreg [carvedilol], metformin and related, protonix [pantoprazole sodium], rosuvastatin calcium, and statins.  MEDICATIONS:  Current Outpatient Medications  Medication Sig Dispense Refill   aspirin EC 81 MG tablet Take 1 tablet (81 mg total) by mouth daily. 90 tablet 3   clopidogrel (PLAVIX) 75 MG tablet TAKE 1 TABLET BY MOUTH ONCE DAILY 90 tablet 0   Continuous Blood Gluc Sensor (FREESTYLE LIBRE 2 SENSOR) MISC Use to check blood sugars 2 each 3   Dulaglutide (TRULICITY) 1.5 NW/2.9FA SOPN Inject 1.5 mg into the skin once a week. 2 mL 11   ezetimibe (ZETIA) 10 MG tablet Take 1 tablet (10 mg total) by mouth daily. 90 tablet 3   furosemide (LASIX) 20 MG tablet Take 1 tablet (20 mg total) by mouth as needed. 90 tablet 1   glucose blood (ONETOUCH ULTRA) test strip CHECK BLOOD SUGAR TWICE A DAY - DX E11.59 200 strip 3   Insulin Pen Needle 32G X 4 MM MISC Use 1x a day 100 each 3   INVOKANA 300 MG TABS tablet TAKE 1 TABLET BY MOUTH ONCE A DAY WITH BREAKFAST 90 tablet 3   isosorbide mononitrate (IMDUR) 30 MG 24 hr  tablet Take 0.5 tablets (15 mg total) by mouth daily. 45 tablet 3   losartan (COZAAR) 25 MG tablet TAKE 1 TABLET BY MOUTH ONCE A DAY 90 tablet 0   MYRBETRIQ 50 MG TB24 tablet TAKE 1 TABLET BY MOUTH ONCE A DAY 30 tablet 12   nitroGLYCERIN (NITROSTAT) 0.4 MG SL tablet Place 1 tablet (0.4 mg total) under the tongue as needed. 25 tablet 2   REPATHA SURECLICK 213 MG/ML SOAJ INJECT 1 PEN INTO THE SKIN EVERY 14 DAYS 2 mL 1   tamsulosin (FLOMAX) 0.4 MG CAPS capsule TAKE 1 CAPSULE BY MOUTH ONCE DAILY AFTERSUPPER 30 capsule 11   TRESIBA FLEXTOUCH 200 UNIT/ML FlexTouch Pen INJECT 100 UNITS INTO THE SKIN DAILY 18 mL 3   meclizine (ANTIVERT) 25 MG tablet Take 0.5-1 tablets (12.5-25 mg total) by mouth 2 (two) times daily as needed for dizziness or nausea. (Patient not taking: Reported on 03/27/2022) 30 tablet 0   sulfamethoxazole-trimethoprim (BACTRIM DS) 800-160 MG tablet Take 1 tablet by mouth 2 (two) times daily. (Patient not taking: Reported on 03/27/2022) 14 tablet 0   No current facility-administered medications for this visit.      Marland Kitchen  PHYSICAL EXAMINATION: ECOG PERFORMANCE STATUS: 0 - Asymptomatic  Vitals:   03/27/22 1000  BP: 108/65  Pulse: 89  Resp: 16  Temp: 99.5 F (37.5 C)   Filed Weights   03/27/22 1000  Weight: 246 lb 3.2 oz (111.7 kg)    Physical Exam HENT:     Head: Normocephalic and atraumatic.     Mouth/Throat:     Pharynx: No oropharyngeal exudate.  Eyes:     Pupils: Pupils are equal, round, and reactive to light.  Cardiovascular:     Rate and Rhythm: Normal rate  and regular rhythm.  Pulmonary:     Effort: Pulmonary effort is normal. No respiratory distress.     Breath sounds: Normal breath sounds. No wheezing.  Abdominal:     General: Bowel sounds are normal. There is no distension.     Palpations: Abdomen is soft. There is no mass.     Tenderness: There is no abdominal tenderness. There is no guarding or rebound.  Musculoskeletal:        General: No tenderness.  Normal range of motion.     Cervical back: Normal range of motion and neck supple.  Skin:    General: Skin is warm.  Neurological:     Mental Status: He is alert and oriented to person, place, and time.  Psychiatric:        Mood and Affect: Affect normal.     LABORATORY DATA:  I have reviewed the data as listed Lab Results  Component Value Date   WBC 4.0 03/27/2022   HGB 13.7 03/27/2022   HCT 40.2 03/27/2022   MCV 87.6 03/27/2022   PLT 134 (L) 03/27/2022   Recent Labs    12/25/21 1009 02/08/22 1010 03/06/22 1222 03/27/22 1019  NA 133* 136 138 133*  K 4.3 4.8 4.6 4.1  CL 100 102 103 101  CO2 26 26 26 24   GLUCOSE 341* 302* 163* 188*  BUN 22 24* 19 22  CREATININE 0.99 1.15 1.00 0.98  CALCIUM 8.5* 9.1 9.5 8.3*  GFRNONAA >60  --  >60 >60  PROT 6.7 6.4  --  6.8  ALBUMIN 3.8 4.2  --  3.6  AST 15 11  --  22  ALT 18 14  --  29  ALKPHOS 61 66  --  54  BILITOT 0.9 0.7  --  1.9*    Latest Reference Range & Units Most Recent 03/17/20 10:12 05/19/20 10:07 08/26/20 09:22 11/24/20 10:04 11/29/20 08:43 03/27/21 10:09 05/25/21 08:52 06/26/21 10:19 09/25/21 10:15 11/24/21 08:49  Prostatic Specific Antigen 0.00 - 4.00 ng/mL <0.01 11/24/21 08:49 0.21 0.16 0.03 <0.01 <0.01 <0.01 <0.01 <0.01 <0.01 <0.01    RADIOGRAPHIC STUDIES: I have personally reviewed the radiological images as listed and agreed with the findings in the report. CARDIAC CATHETERIZATION  Result Date: 03/19/2022   Mid LM to Mid LAD lesion is 10% stenosed.   Mid LAD-1 lesion is 60% stenosed.   2nd Diag lesion is 100% stenosed.   Dist LAD lesion is 10% stenosed.   Mid LAD-2 lesion is 40% stenosed.   Ost Cx to Prox Cx lesion is 60% stenosed.   Origin to Prox Graft lesion is 100% stenosed.   Origin lesion is 100% stenosed.   Prox RCA to Mid RCA lesion is 100% stenosed.   SVG.   There is mild left ventricular systolic dysfunction.   LV end diastolic pressure is mildly elevated.   The left ventricular ejection fraction is 45-50%  by visual estimate. 1.  Significant native three-vessel coronary artery disease with occluded grafts including SVG to OM, SVG to RCA and LIMA to LAD.  The SVG to OM occlusion is new compared to most recent cardiac catheterization but the other 2 grafts were noted to be occluded.  Patent left main and LAD stent with minimal restenosis.  Moderate diffuse mid LAD disease between the 2 stented segments.  In addition, the native left circumflex seems to have moderate nonobstructive disease.  The RCA is chronically occluded with left-to-right collaterals. 2.  Mildly reduced LV systolic function with inferior  wall hypokinesis and mildly elevated left ventricular end-diastolic pressure. Recommendations: Continue aggressive medical therapy. FFR based revascularization of the LAD and left circumflex can be considered for refractory symptoms but this is limited by diffuse disease.  The patient's anginal symptoms seem to be reasonably controlled with medications.   ECHOCARDIOGRAM COMPLETE  Result Date: 03/15/2022    ECHOCARDIOGRAM REPORT   Patient Name:   SR. Franchot Mimes Date of Exam: 03/15/2022 Medical Rec #:  [id number]           Height:       69.0 in Accession #:    [id number]          Weight:       245.0 lb Date of Birth:  1950/11/27          BSA:          2.252 m Patient Age:    72 years            BP:           135/73 mmHg Patient Gender: M                   HR:           64 bpm. Exam Location:  [facility] Procedure: 2D Echo, Cardiac Doppler, Color Doppler and Intracardiac            Opacification Agent Indications:    R07.9* Chest pain, unspecified  History:        Patient has prior history of Echocardiogram examinations, most                 recent 04/28/2020. CAD, Stroke and PAD, Signs/Symptoms:Chest Pain                 and Syncope; Risk Factors:Hypertension, Diabetes, Dyslipidemia,                 Former Smoker and Sleep Apnea. Pre-catheterization.  Sonographer:    Pilar Jarvis RDMS, RVT, RDCS Referring Phys:  [id number] BRIAN AGBOR-ETANG IMPRESSIONS  1. Left ventricular ejection fraction, by estimation, is 60 to 65%. The left ventricle has normal function. The left ventricle has no regional wall motion abnormalities. There is moderate asymmetric left ventricular hypertrophy of the basal-septal segment. Left ventricular diastolic parameters are consistent with Grade I diastolic dysfunction (impaired relaxation).  2. Right ventricular systolic function is normal. The right ventricular size is normal. There is normal pulmonary artery systolic pressure. The estimated right ventricular systolic pressure is 43.3 mmHg.  3. Left atrial size was mildly dilated.  4. The mitral valve is normal in structure. Mild mitral valve regurgitation. No evidence of mitral stenosis.  5. The aortic valve is normal in structure. Aortic valve regurgitation is not visualized. Aortic valve sclerosis/calcification is present, without any evidence of aortic stenosis.  6. The inferior vena cava is normal in size with greater than 50% respiratory variability, suggesting right atrial pressure of 3 mmHg. FINDINGS  Left Ventricle: Left ventricular ejection fraction, by estimation, is 60 to 65%. The left ventricle has normal function. The left ventricle has no regional wall motion abnormalities. Definity contrast agent was given IV to delineate the left ventricular  endocardial borders. The left ventricular internal cavity size was normal in size. There is moderate asymmetric left ventricular hypertrophy of the basal-septal segment. Left ventricular diastolic parameters are consistent with Grade I diastolic dysfunction (impaired relaxation). Right Ventricle: The right ventricular size is normal. No increase in right ventricular wall thickness. Right ventricular  systolic function is normal. There is normal pulmonary artery systolic pressure. The tricuspid regurgitant velocity is 2.46 m/s, and  with an assumed right atrial pressure of 5 mmHg, the estimated  right ventricular systolic pressure is 97.9 mmHg. Left Atrium: Left atrial size was mildly dilated. Right Atrium: Right atrial size was normal in size. Pericardium: There is no evidence of pericardial effusion. Mitral Valve: The mitral valve is normal in structure. Mild mitral valve regurgitation. No evidence of mitral valve stenosis. Tricuspid Valve: The tricuspid valve is normal in structure. Tricuspid valve regurgitation is mild . No evidence of tricuspid stenosis. Aortic Valve: The aortic valve is normal in structure. Aortic valve regurgitation is not visualized. Aortic valve sclerosis/calcification is present, without any evidence of aortic stenosis. Aortic valve mean gradient measures 8.5 mmHg. Aortic valve peak  gradient measures 15.6 mmHg. Aortic valve area, by VTI measures 2.15 cm. Pulmonic Valve: The pulmonic valve was normal in structure. Pulmonic valve regurgitation is not visualized. No evidence of pulmonic stenosis. Aorta: The aortic root is normal in size and structure. Venous: The inferior vena cava is normal in size with greater than 50% respiratory variability, suggesting right atrial pressure of 3 mmHg. IAS/Shunts: No atrial level shunt detected by color flow Doppler.  LEFT VENTRICLE PLAX 2D LVIDd:         4.90 cm   Diastology LVIDs:         3.70 cm   LV e' medial:    4.46 cm/s LV PW:         1.10 cm   LV E/e' medial:  21.3 LV IVS:        1.60 cm   LV e' lateral:   10.30 cm/s LVOT diam:     2.30 cm   LV E/e' lateral: 9.2 LV SV:         94 LV SV Index:   42 LVOT Area:     4.15 cm  RIGHT VENTRICLE TAPSE (M-mode): 1.9 cm LEFT ATRIUM             Index        RIGHT ATRIUM           Index LA diam:        4.65 cm 2.06 cm/m   RA Area:     15.90 cm LA Vol (A2C):   94.2 ml 41.83 ml/m  RA Volume:   39.90 ml  17.72 ml/m LA Vol (A4C):   67.0 ml 29.75 ml/m LA Biplane Vol: 85.3 ml 37.88 ml/m  AORTIC VALVE                     PULMONIC VALVE AV Area (Vmax):    2.02 cm      PV Vmax:       0.95 m/s AV Area  (Vmean):   1.93 cm      PV Peak grad:  3.6 mmHg AV Area (VTI):     2.15 cm AV Vmax:           197.50 cm/s AV Vmean:          133.500 cm/s AV VTI:            0.437 m AV Peak Grad:      15.6 mmHg AV Mean Grad:      8.5 mmHg LVOT Vmax:         96.00 cm/s LVOT Vmean:        62.000 cm/s LVOT VTI:  0.226 m LVOT/AV VTI ratio: 0.52  AORTA Ao Root diam: 3.25 cm Ao Asc diam:  3.20 cm Ao Arch diam: 3.2 cm MITRAL VALVE               TRICUSPID VALVE MV Area (PHT): 2.76 cm    TR Peak grad:   24.2 mmHg MV Decel Time: 275 msec    TR Vmax:        246.00 cm/s MV E velocity: 95.10 cm/s MV A velocity: 89.50 cm/s  SHUNTS MV E/A ratio:  1.06        Systemic VTI:  0.23 m                            Systemic Diam: 2.30 cm Ida Rogue MD Electronically signed by Ida Rogue MD Signature Date/Time: 03/15/2022/4:57:15 PM    Final     ASSESSMENT & PLAN:   Prostate cancer ([place]) # STAGE-IV-Prostate cancer-metastatic to bone/castrate resistant. S/P- EBRT to prostate [last Tx- on 01/08/2020]. PET Oct 10th, 2021-Mild asymmetric activity within the prostate gland decreased from comparison from Feb 2021; No convincing evidence of nodal metastasis. No visceral metastasis or skeletal metastasis. Currently off- Darolutamide [sec to poor tolerance].   April 2023-- <0.01.   #Given patient's preference/side effects- overall steady PSA less than 0.01. Last eligard May 2022- [place] [Dr.Stoioff]-continue intermittent ADT.  Continue to hold off on eligard  at this time.  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 HOLD off.   #Hypocalcemia-calcium 8.3.  Recommend gummies calcium plus vitamin D; 1000 units once a day. Awaiting vit D levels.   #Increased frequency of urination-status post work-up with urology/defer to PCP.  # DM-2 on insulin/OHA- BG 170 [PBF; Dr.Gherge]- STABLE.   my chart # DISPOSITION:  #   Follow up in 3 months; MD- labs- cbc/cmp/PSA;  Dr.B     All questions were answered. The patient knows to call the clinic with any problems, questions or concerns.    Cammie Sickle, MD 03/27/2022 12:44 PM

## 2022-03-27 NOTE — Progress Notes (Deleted)
03/27/22 9:54 PM   Phillip Clements 14-Jan-1951 [id number]  Referring provider:  Tonia Ghent, MD [place],  [place] [address]  No chief complaint on file.   Urological history  Prostate adenocarcinoma -PSA, 12/2021 - <0.01 -  02/2006 PSA returned elevated at 17.49. 04/03/2006; PSA 24.7; prostate biopsy Gleason 3+3 with 4/4 cores -  [place] with HIFU [place] late 2007  -  persistent PSA elevation over period 2007-2010; nadir 2.1 09/16/06, peak 15.0 ng/mL  - 04/2006 bone scan and CT abd/pelvis without evidence of metastasis - 03/08/2007 CT abdomen/pelvis with no definitive metastatic disease noted  - 03/18/2007 prostascint scan with no evidence of disease but small pelvic lymph nodes noted  - 08/15/2009 prostascint scan with no evidence of local prostate cancer or distant metastases  -06/02/10 initiation of degarelix  -05/25/10 PET CT (Fluoride) revealed heterogenous areas of FDG-avidity in ribs, femurs, humeri, areas also in the metatarsals noted which may have been arthiritic in nature  m. 07/03/10 PSA 9.6 ng/mL  -  Started Lupron/Casodex - Axumin scan 09/2019 showed tracer avid soft tissue and prostate bed; no evidence of nodal or osseous metastasis -  completed salvage radiation 01/08/2020 - PVR, 02/2022 0 mL  2. High risk hematuria -former smoker -CTU pending -cysto, 03/2022 - Prostatic urethra open with hypervascularity, shaggy -mucosa posterior urethra without discrete mass - open bladder neck -urine cytology, 03/2022 - negative -no reports of gross heme -UA negative for micro heme, 03/2022  HPI: Phillip Clements is a 71 y.o.male who presents today for incontinence that started after his cysto.    UA ***  PMH: Past Medical History:  Diagnosis Date   Cataract    bilateral eye in 2010   Coronary artery disease    a. CABG 04/2014: (LIMA->LAD, VG->DIAG, VG->OM2, VG->PDA)  b. 07/2014 early graft failure (VG->OM2 60, LIMA->LAD atretic, VG->PDA & VG->D1 100). s/p  successful rotational atherectomy & DES of the LM & pLAD (3.0x24 Promus DES) & mLAD (2.25x24 Promus DES).   CVA (cerebral infarction)    Family history of prostate cancer    Hx of adenomatous colonic polyps 06/17/2017   Hyperlipidemia    a. Statin intolerant.   Hypertension 2000   Moderate mitral regurgitation    a. 02/2015 Echo: EF 55-60%, no rwma, mod MR, mod BAE, mildly dil RV w/ low nl RV fxn.   OSA on CPAP    Peripheral vascular disease ([place])    a. left SFA angioplasty in December 2016   Prostate cancer [facility])    Sleep apnea    wears CPAP nightly   Type II diabetes mellitus [facility])     Surgical History: Past Surgical History:  Procedure Laterality Date   ANTERIOR CERVICAL DECOMP/DISCECTOMY FUSION  08/25/2012   Procedure: ANTERIOR CERVICAL DECOMPRESSION/DISCECTOMY FUSION 2 LEVELS;  Surgeon: Hosie Spangle, MD;  Location: [place] NEURO ORS;  Service: Neurosurgery;  Laterality: N/A;  Cervical five-six Cervical six-seven anterior cervical decompression with fusion and plating and bonegraft   BACK SURGERY     CARDIAC CATHETERIZATION  04/2014; 07/19/2014   CORONARY ARTERY BYPASS GRAFT N/A 04/26/2014   Procedure: CORONARY ARTERY BYPASS GRAFTING (CABG), on pump, times four, using left internal mammary artery, right greater saphenous vein harvested endoscopically.;  Surgeon: Ivin Poot, MD;  Location: [place];  Service: Open Heart Surgery;  Laterality: N/A;  LIMA to LAD, SVG to DIAGONAL, SVG to OM1, SVG to PDA) with EVH of the RIGHT THIGH and LOWER EXTREMITY SAPHENOUS VEIN  Cytoscopy prostatic stone o/w nml  06/21/08   Dr. Jacqlyn Larsen   ETT myoview  09/13/09   Low risk EF 49%   High intense focused ultrasound  07/20/06   By Dr. Jacqlyn Larsen   INTRAOPERATIVE TRANSESOPHAGEAL ECHOCARDIOGRAM N/A 04/26/2014   Procedure: INTRAOPERATIVE TRANSESOPHAGEAL ECHOCARDIOGRAM;  Surgeon: Ivin Poot, MD;  Location: [place];  Service: Open Heart Surgery;  Laterality: N/A;   LEFT HEART CATH AND CORONARY ANGIOGRAPHY Left 03/19/2022    Procedure: LEFT HEART CATH AND CORONARY ANGIOGRAPHY;  Surgeon: Wellington Hampshire, MD;  Location: [place] CV LAB;  Service: Cardiovascular;  Laterality: Left;   PERCUTANEOUS CORONARY ROTOBLATOR INTERVENTION (PCI-R) N/A 07/22/2014   Procedure: PERCUTANEOUS CORONARY ROTOBLATOR INTERVENTION (PCI-R);  Surgeon: Peter M Martinique, MD;  Location: [facility] CATH LAB;  Service: Cardiovascular;  Laterality: N/A;   PERIPHERAL VASCULAR CATHETERIZATION Left 07/26/2015   Procedure: Lower Extremity Angiography;  Surgeon: Katha Cabal, MD;  Location: [place] CV LAB;  Service: Cardiovascular;  Laterality: Left;   PERIPHERAL VASCULAR CATHETERIZATION  07/26/2015   Procedure: Lower Extremity Intervention;  Surgeon: Katha Cabal, MD;  Location: [place] CV LAB;  Service: Cardiovascular;;   PROSTATE BIOPSY  04/03/06 & 02/25/07    Home Medications:  Allergies as of 03/28/2022       Reactions   Coreg [carvedilol] Other (See Comments)   Fatigue- unclear if this was due to coreg specifically or beta blockers in general.     Metformin And Related Nausea Only   Protonix [pantoprazole Sodium] Diarrhea   Rosuvastatin Calcium    REACTION: JOINT ACHES   Statins    REACTION: JOINT ACHES        Medication List        Accurate as of March 27, 2022  9:54 PM. If you have any questions, ask your nurse or doctor.          aspirin EC 81 MG tablet Take 1 tablet (81 mg total) by mouth daily.   clopidogrel 75 MG tablet Commonly known as: PLAVIX TAKE 1 TABLET BY MOUTH ONCE DAILY   ezetimibe 10 MG tablet Commonly known as: ZETIA Take 1 tablet (10 mg total) by mouth daily.   FreeStyle Libre 2 Sensor Misc Use to check blood sugars   furosemide 20 MG tablet Commonly known as: LASIX Take 1 tablet (20 mg total) by mouth as needed.   Insulin Pen Needle 32G X 4 MM Misc Use 1x a day   Invokana 300 MG Tabs tablet Generic drug: canagliflozin TAKE 1 TABLET BY MOUTH ONCE A DAY WITH BREAKFAST    isosorbide mononitrate 30 MG 24 hr tablet Commonly known as: IMDUR Take 0.5 tablets (15 mg total) by mouth daily.   losartan 25 MG tablet Commonly known as: COZAAR TAKE 1 TABLET BY MOUTH ONCE A DAY   meclizine 25 MG tablet Commonly known as: ANTIVERT Take 0.5-1 tablets (12.5-25 mg total) by mouth 2 (two) times daily as needed for dizziness or nausea.   Myrbetriq 50 MG Tb24 tablet Generic drug: mirabegron ER TAKE 1 TABLET BY MOUTH ONCE A DAY   nitroGLYCERIN 0.4 MG SL tablet Commonly known as: Nitrostat Place 1 tablet (0.4 mg total) under the tongue as needed.   OneTouch Ultra test strip Generic drug: glucose blood CHECK BLOOD SUGAR TWICE A DAY - DX R15.40   Repatha SureClick 086 MG/ML Soaj Generic drug: Evolocumab INJECT 1 PEN INTO THE SKIN EVERY 14 DAYS   sulfamethoxazole-trimethoprim 800-160 MG tablet Commonly known as: BACTRIM DS Take  1 tablet by mouth 2 (two) times daily.   tamsulosin 0.4 MG Caps capsule Commonly known as: FLOMAX TAKE 1 CAPSULE BY MOUTH ONCE DAILY AFTERSUPPER   Tresiba FlexTouch 200 UNIT/ML FlexTouch Pen Generic drug: insulin degludec INJECT 100 UNITS INTO THE SKIN DAILY   Trulicity 1.5 RK/2.7CW Sopn Generic drug: Dulaglutide Inject 1.5 mg into the skin once a week.        Allergies:  Allergies  Allergen Reactions   Coreg [Carvedilol] Other (See Comments)    Fatigue- unclear if this was due to coreg specifically or beta blockers in general.     Metformin And Related Nausea Only   Protonix [Pantoprazole Sodium] Diarrhea   Rosuvastatin Calcium     REACTION: JOINT ACHES   Statins     REACTION: JOINT ACHES    Family History: Family History  Problem Relation Age of Onset   Diabetes Mother 76       DM   Coronary artery disease Mother    Hypertension Mother    Heart attack Mother        CABG with MI in 2005   Heart disease Mother        CV   Hypertension Father    Heart attack Father        CABG with Mi around 1997   Coronary  artery disease Father    Heart disease Father        CV   Diabetes Father    Heart attack Brother        MI/PTCA   Heart disease Brother        CV   Diabetes Brother    Heart attack Maternal Grandfather        MI   Prostate cancer Paternal Grandfather 37       likely metastatic   Diabetes Sister        DM   Breast cancer Neg Hx        Breast/ovarian/uterine cancer   Colon cancer Neg Hx    Depression Neg Hx    Alcohol abuse Neg Hx        ETOH/drug abuse   Stroke Neg Hx     Social History:  reports that he quit smoking about 8 years ago. His smoking use included cigarettes. He has a 36.00 pack-year smoking history. He has never used smokeless tobacco. He reports current alcohol use. He reports that he does not use drugs.   Physical Exam: There were no vitals taken for this visit.  Constitutional:  Well nourished. Alert and oriented, No acute distress. HEENT: [place] AT, moist mucus membranes.  Trachea midline Cardiovascular: No clubbing, cyanosis, or edema. Respiratory: Normal respiratory effort, no increased work of breathing. GU: No CVA tenderness.  No bladder fullness or masses.  Patient with circumcised/uncircumcised phallus. ***Foreskin easily retracted***  Urethral meatus is patent.  No penile discharge. No penile lesions or rashes. Scrotum without lesions, cysts, rashes and/or edema.  Testicles are located scrotally bilaterally. No masses are appreciated in the testicles. Left and right epididymis are normal. Rectal: Patient with  normal sphincter tone. Anus and perineum without scarring or rashes. No rectal masses are appreciated. Prostate is approximately *** grams, *** nodules are appreciated. Seminal vesicles are normal. Neurologic: Grossly intact, no focal deficits, moving all 4 extremities. Psychiatric: Normal mood and affect.   Laboratory Data: Component     Latest Ref Rng 03/27/2022  Prostatic Specific Antigen     0.00 - 4.00 ng/mL <0.01  Latest Ref Rng &  Units 03/27/2022   10:19 AM 03/06/2022   12:22 PM 02/08/2022   10:10 AM  CBC  WBC 4.0 - 10.5 K/uL 4.0  5.6  6.6   Hemoglobin 13.0 - 17.0 g/dL 13.7  15.2  15.3   Hematocrit 39.0 - 52.0 % 40.2  45.6  44.9   Platelets 150 - 400 K/uL 134  189  217.0        Latest Ref Rng & Units 03/27/2022   10:19 AM 03/06/2022   12:22 PM 02/08/2022   10:10 AM  CMP  Glucose 70 - 99 mg/dL 188  163  302   BUN 8 - 23 mg/dL '22  19  24   '$ Creatinine 0.61 - 1.24 mg/dL 0.98  1.00  1.15   Sodium 135 - 145 mmol/L 133  138  136   Potassium 3.5 - 5.1 mmol/L 4.1  4.6  4.8   Chloride 98 - 111 mmol/L 101  103  102   CO2 22 - 32 mmol/L '24  26  26   '$ Calcium 8.9 - 10.3 mg/dL 8.3  9.5  9.1   Total Protein 6.5 - 8.1 g/dL 6.8   6.4   Total Bilirubin 0.3 - 1.2 mg/dL 1.9   0.7   Alkaline Phos 38 - 126 U/L 54   66   AST 15 - 41 U/L 22   11   ALT 0 - 44 U/L 29   14     Component     Latest Ref Rng 03/27/2022  Vitamin D, 25-Hydroxy     30 - 100 ng/mL 24.47 (L)     Legend: (L) Low  Component     Latest Ref Rng 03/27/2022  Cholesterol     0 - 200 mg/dL 107   Triglycerides     <150 mg/dL 156 (H)   HDL Cholesterol     >40 mg/dL 27 (L)   Total CHOL/HDL Ratio     RATIO 4.0   VLDL     0 - 40 mg/dL 31   LDL (calc)     0 - 99 mg/dL 49     Legend: (H) High (L) Low I have reviewed the labs.   Pertinent Imaging: No results found for any visits on 03/28/22.    Assessment & Plan:    Gross hematuria - cysto 03/2022 w/prostate hypervascularity - CT urogram pending  2. Urinary frequency/incontinence - UA ***  3. Prostate cancer -recent PSA undetectable - followed by cancer center  No follow-ups on file.  Zara Council, PA-C   [facility] [address], [place], [place] [address] [phone]

## 2022-03-28 ENCOUNTER — Ambulatory Visit: Payer: Medicare Other | Admitting: Urology

## 2022-03-28 DIAGNOSIS — R310 Gross hematuria: Secondary | ICD-10-CM

## 2022-03-28 DIAGNOSIS — C61 Malignant neoplasm of prostate: Secondary | ICD-10-CM

## 2022-03-28 DIAGNOSIS — R350 Frequency of micturition: Secondary | ICD-10-CM

## 2022-03-29 ENCOUNTER — Encounter: Payer: Self-pay | Admitting: Urology

## 2022-03-29 ENCOUNTER — Ambulatory Visit (INDEPENDENT_AMBULATORY_CARE_PROVIDER_SITE_OTHER): Payer: Medicare Other | Admitting: Urology

## 2022-03-29 VITALS — BP 114/68 | HR 89 | Ht 69.0 in | Wt 242.0 lb

## 2022-03-29 DIAGNOSIS — R1084 Generalized abdominal pain: Secondary | ICD-10-CM

## 2022-03-29 DIAGNOSIS — R142 Eructation: Secondary | ICD-10-CM | POA: Diagnosis not present

## 2022-03-29 DIAGNOSIS — R32 Unspecified urinary incontinence: Secondary | ICD-10-CM

## 2022-03-29 DIAGNOSIS — I2510 Atherosclerotic heart disease of native coronary artery without angina pectoris: Secondary | ICD-10-CM

## 2022-03-29 LAB — URINALYSIS, COMPLETE
Bilirubin, UA: NEGATIVE
Ketones, UA: NEGATIVE
Leukocytes,UA: NEGATIVE
Nitrite, UA: NEGATIVE
Protein,UA: NEGATIVE
RBC, UA: NEGATIVE
Specific Gravity, UA: <1.005 — ABNORMAL LOW (ref 1.005–1.030)
Urobilinogen, Ur: 0.2 mg/dL (ref 0.2–1.0)
pH, UA: 5 (ref 5.0–7.5)

## 2022-03-29 LAB — MICROSCOPIC EXAMINATION: Bacteria, UA: NONE SEEN

## 2022-03-29 MED ORDER — GEMTESA 75 MG PO TABS: 75.0000 mg | ORAL_TABLET | Freq: Every day | ORAL | 0 refills | Status: AC

## 2022-03-29 NOTE — Progress Notes (Signed)
03/29/22 11:25 AM   Phillip Clements [birth year] [id number]  Referring provider:  Tonia Ghent, MD [address] [place],  [place] [address]  Chief Complaint  Patient presents with   Urinary Incontinence    Urological history  Prostate adenocarcinoma -PSA, 12/2021 - <0.01 -  02/2006 PSA returned elevated at 17.49. 04/03/2006; PSA 24.7; prostate biopsy Gleason 3+3 with 4/4 cores -  [place]U [place] late 2007  -  persistent PSA elevation over period 2007-2010; nadir 2.1 09/16/06, peak 15.0 ng/mL  - 04/2006 bone scan and CT abd/pelvis without evidence of metastasis - 03/08/2007 CT abdomen/pelvis with no definitive metastatic disease noted  - 03/18/2007 prostascint scan with no evidence of disease but small pelvic lymph nodes noted  - 08/15/2009 prostascint scan with no evidence of local prostate cancer or distant metastases  -06/02/10 initiation of degarelix  -05/25/10 PET CT (Fluoride) revealed heterogenous areas of FDG-avidity in ribs, femurs, humeri, areas also in the metatarsals noted which may have been arthiritic in nature  m. 07/03/10 PSA 9.6 ng/mL  -  Started Lupron/Casodex - Axumin scan 09/2019 showed tracer avid soft tissue and prostate bed; no evidence of nodal or osseous metastasis -  completed salvage radiation 01/08/2020 - PVR, 02/2022 0 mL  2. High risk hematuria -former smoker -CTU pending -cysto, 03/2022 - Prostatic urethra open with hypervascularity, shaggy -mucosa posterior urethra without discrete mass - open bladder neck -urine cytology, 03/2022 - negative -no reports of gross heme -UA negative for micro heme, 03/2022  HPI: Phillip Clements is a 71 y.o.male who presents today for incontinence that started after his cysto with his wife, [name].  He states the incontinence was worse a few days after the cystoscopic, but now is returned to baseline.  He has reduced his Dr. Malachi Bonds consumption going from 8 a day to 1 daily.  The rest the time he is only drinking  water.  He is saw no improvement in his urinary incontinence.  Patient denies any modifying or aggravating factors.  Patient denies any gross hematuria, dysuria or suprapubic/flank pain.  Patient denies any fevers, chills, nausea or vomiting.    UA negative   He has not yet been scheduled for his CT urogram.  His recent cystoscopy did not find any significant abnormalities and his urine cytology was negative.  PMH: Past Medical History:  Diagnosis Date   Cataract    bilateral eye in 2010   Coronary artery disease    a. CABG 04/2014: (LIMA->LAD, VG->DIAG, VG->OM2, VG->PDA)  b. 07/2014 early graft failure (VG->OM2 60, LIMA->LAD atretic, VG->PDA & VG->D1 100). s/p successful rotational atherectomy & DES of the LM & pLAD (3.0x24 Promus DES) & mLAD (2.25x24 Promus DES).   CVA (cerebral infarction)    Family history of prostate cancer    Hx of adenomatous colonic polyps 06/17/2017   Hyperlipidemia    a. Statin intolerant.   Hypertension 2000   Moderate mitral regurgitation    a. 02/2015 Echo: EF 55-60%, no rwma, mod MR, mod BAE, mildly dil RV w/ low nl RV fxn.   OSA on CPAP    Peripheral vascular disease ([place])    a. left SFA angioplasty in December 2016   Prostate cancer [facility])    Sleep apnea    wears CPAP nightly   Type II diabetes mellitus [facility])     Surgical History: Past Surgical History:  Procedure Laterality Date   ANTERIOR CERVICAL DECOMP/DISCECTOMY FUSION  08/25/2012   Procedure: ANTERIOR CERVICAL DECOMPRESSION/DISCECTOMY  FUSION 2 LEVELS;  Surgeon: Hosie Spangle, MD;  Location: [place] NEURO ORS;  Service: Neurosurgery;  Laterality: N/A;  Cervical five-six Cervical six-seven anterior cervical decompression with fusion and plating and bonegraft   BACK SURGERY     CARDIAC CATHETERIZATION  04/2014; 07/19/2014   CORONARY ARTERY BYPASS GRAFT N/A 04/26/2014   Procedure: CORONARY ARTERY BYPASS GRAFTING (CABG), on pump, times four, using left internal mammary artery, right greater saphenous  vein harvested endoscopically.;  Surgeon: Ivin Poot, MD;  Location: [place];  Service: Open Heart Surgery;  Laterality: N/A;  LIMA to LAD, SVG to DIAGONAL, SVG to OM1, SVG to PDA) with EVH of the RIGHT THIGH and LOWER EXTREMITY SAPHENOUS VEIN   Cytoscopy prostatic stone o/w nml  06/21/08   Dr. Jacqlyn Larsen   ETT myoview  09/13/09   Low risk EF 49%   High intense focused ultrasound  07/20/06   By Dr. Jacqlyn Larsen   INTRAOPERATIVE TRANSESOPHAGEAL ECHOCARDIOGRAM N/A 04/26/2014   Procedure: INTRAOPERATIVE TRANSESOPHAGEAL ECHOCARDIOGRAM;  Surgeon: Ivin Poot, MD;  Location: [place];  Service: Open Heart Surgery;  Laterality: N/A;   LEFT HEART CATH AND CORONARY ANGIOGRAPHY Left 03/19/2022   Procedure: LEFT HEART CATH AND CORONARY ANGIOGRAPHY;  Surgeon: Wellington Hampshire, MD;  Location: [place] CV LAB;  Service: Cardiovascular;  Laterality: Left;   PERCUTANEOUS CORONARY ROTOBLATOR INTERVENTION (PCI-R) N/A 07/22/2014   Procedure: PERCUTANEOUS CORONARY ROTOBLATOR INTERVENTION (PCI-R);  Surgeon: Peter M Martinique, MD;  Location: [facility] CATH LAB;  Service: Cardiovascular;  Laterality: N/A;   PERIPHERAL VASCULAR CATHETERIZATION Left 07/26/2015   Procedure: Lower Extremity Angiography;  Surgeon: Katha Cabal, MD;  Location: [place] CV LAB;  Service: Cardiovascular;  Laterality: Left;   PERIPHERAL VASCULAR CATHETERIZATION  07/26/2015   Procedure: Lower Extremity Intervention;  Surgeon: Katha Cabal, MD;  Location: [place] CV LAB;  Service: Cardiovascular;;   PROSTATE BIOPSY  04/03/06 & 02/25/07    Home Medications:  Allergies as of 03/29/2022       Reactions   Coreg [carvedilol] Other (See Comments)   Fatigue- unclear if this was due to coreg specifically or beta blockers in general.     Metformin And Related Nausea Only   Protonix [pantoprazole Sodium] Diarrhea   Rosuvastatin Calcium    REACTION: JOINT ACHES   Statins    REACTION: JOINT ACHES        Medication List        Accurate as of  March 29, 2022 11:25 AM. If you have any questions, ask your nurse or doctor.          STOP taking these medications    Myrbetriq 50 MG Tb24 tablet Generic drug: mirabegron ER       TAKE these medications    aspirin EC 81 MG tablet Take 1 tablet (81 mg total) by mouth daily.   clopidogrel 75 MG tablet Commonly known as: PLAVIX TAKE 1 TABLET BY MOUTH ONCE DAILY   ezetimibe 10 MG tablet Commonly known as: ZETIA Take 1 tablet (10 mg total) by mouth daily.   FreeStyle Libre 2 Sensor Misc Use to check blood sugars   furosemide 20 MG tablet Commonly known as: LASIX Take 1 tablet (20 mg total) by mouth as needed.   Gemtesa 75 MG Tabs Generic drug: Vibegron Take 75 mg by mouth daily.   Insulin Pen Needle 32G X 4 MM Misc Use 1x a day   Invokana 300 MG Tabs tablet Generic drug: canagliflozin TAKE 1 TABLET BY MOUTH  ONCE A DAY WITH BREAKFAST   isosorbide mononitrate 30 MG 24 hr tablet Commonly known as: IMDUR Take 0.5 tablets (15 mg total) by mouth daily.   losartan 25 MG tablet Commonly known as: COZAAR TAKE 1 TABLET BY MOUTH ONCE A DAY   meclizine 25 MG tablet Commonly known as: ANTIVERT Take 0.5-1 tablets (12.5-25 mg total) by mouth 2 (two) times daily as needed for dizziness or nausea.   nitroGLYCERIN 0.4 MG SL tablet Commonly known as: Nitrostat Place 1 tablet (0.4 mg total) under the tongue as needed.   OneTouch Ultra test strip Generic drug: glucose blood CHECK BLOOD SUGAR TWICE A DAY - DX V37.10   Repatha SureClick 626 MG/ML Soaj Generic drug: Evolocumab INJECT 1 PEN INTO THE SKIN EVERY 14 DAYS   sulfamethoxazole-trimethoprim 800-160 MG tablet Commonly known as: BACTRIM DS Take 1 tablet by mouth 2 (two) times daily.   tamsulosin 0.4 MG Caps capsule Commonly known as: FLOMAX TAKE 1 CAPSULE BY MOUTH ONCE DAILY AFTERSUPPER   Tresiba FlexTouch 200 UNIT/ML FlexTouch Pen Generic drug: insulin degludec INJECT 100 UNITS INTO THE SKIN DAILY    Trulicity 1.5 RS/8.5IO Sopn Generic drug: Dulaglutide Inject 1.5 mg into the skin once a week.        Allergies:  Allergies  Allergen Reactions   Coreg [Carvedilol] Other (See Comments)    Fatigue- unclear if this was due to coreg specifically or beta blockers in general.     Metformin And Related Nausea Only   Protonix [Pantoprazole Sodium] Diarrhea   Rosuvastatin Calcium     REACTION: JOINT ACHES   Statins     REACTION: JOINT ACHES    Family History: Family History  Problem Relation Age of Onset   Diabetes Mother 20       DM   Coronary artery disease Mother    Hypertension Mother    Heart attack Mother        CABG with MI in 2005   Heart disease Mother        CV   Hypertension Father    Heart attack Father        CABG with Mi around 1997   Coronary artery disease Father    Heart disease Father        CV   Diabetes Father    Heart attack Brother        MI/PTCA   Heart disease Brother        CV   Diabetes Brother    Heart attack Maternal Grandfather        MI   Prostate cancer Paternal Grandfather 49       likely metastatic   Diabetes Sister        DM   Breast cancer Neg Hx        Breast/ovarian/uterine cancer   Colon cancer Neg Hx    Depression Neg Hx    Alcohol abuse Neg Hx        ETOH/drug abuse   Stroke Neg Hx     Social History:  reports that he quit smoking about 8 years ago. His smoking use included cigarettes. He has a 36.00 pack-year smoking history. He has never used smokeless tobacco. He reports current alcohol use. He reports that he does not use drugs.   Physical Exam: BP 114/68   Pulse 89   Ht '5\' 9"'$  (1.753 m)   Wt 242 lb (109.8 kg)   BMI 35.74 kg/m   Constitutional:  Well nourished. Alert  and oriented, No acute distress. HEENT: [place] AT, moist mucus membranes.  Trachea midline Cardiovascular: No clubbing, cyanosis, or edema. Respiratory: Normal respiratory effort, no increased work of breathing. Neurologic: Grossly intact, no focal  deficits, moving all 4 extremities. Psychiatric: Normal mood and affect.   Laboratory Data: Component     Latest Ref Rng 03/27/2022  Prostatic Specific Antigen     0.00 - 4.00 ng/mL <0.01       Latest Ref Rng & Units 03/27/2022   10:19 AM 03/06/2022   12:22 PM 02/08/2022   10:10 AM  CBC  WBC 4.0 - 10.5 K/uL 4.0  5.6  6.6   Hemoglobin 13.0 - 17.0 g/dL 13.7  15.2  15.3   Hematocrit 39.0 - 52.0 % 40.2  45.6  44.9   Platelets 150 - 400 K/uL 134  189  217.0        Latest Ref Rng & Units 03/27/2022   10:19 AM 03/06/2022   12:22 PM 02/08/2022   10:10 AM  CMP  Glucose 70 - 99 mg/dL 188  163  302   BUN 8 - 23 mg/dL '22  19  24   '$ Creatinine 0.61 - 1.24 mg/dL 0.98  1.00  1.15   Sodium 135 - 145 mmol/L 133  138  136   Potassium 3.5 - 5.1 mmol/L 4.1  4.6  4.8   Chloride 98 - 111 mmol/L 101  103  102   CO2 22 - 32 mmol/L '24  26  26   '$ Calcium 8.9 - 10.3 mg/dL 8.3  9.5  9.1   Total Protein 6.5 - 8.1 g/dL 6.8   6.4   Total Bilirubin 0.3 - 1.2 mg/dL 1.9   0.7   Alkaline Phos 38 - 126 U/L 54   66   AST 15 - 41 U/L 22   11   ALT 0 - 44 U/L 29   14     Component     Latest Ref Rng 03/27/2022  Vitamin D, 25-Hydroxy     30 - 100 ng/mL 24.47 (L)     Legend: (L) Low  Component     Latest Ref Rng 03/27/2022  Cholesterol     0 - 200 mg/dL 107   Triglycerides     <150 mg/dL 156 (H)   HDL Cholesterol     >40 mg/dL 27 (L)   Total CHOL/HDL Ratio     RATIO 4.0   VLDL     0 - 40 mg/dL 31   LDL (calc)     0 - 99 mg/dL 49     Legend: (H) High (L) Low I have reviewed the labs.   Pertinent Imaging: No results found for any visits on 03/29/22.    Assessment & Plan:    Gross hematuria - cysto 03/2022 w/prostate hypervascularity - CT urogram pending  2. Urinary frequency/incontinence - UA negative -Congratulations given to patient's further decreasing of his soda intake and encouraged him to continue -We will have a trial of Gemtesa 75 mg #28 samples are given while we are waiting for  the CT urogram to be completed and resulted -If CT urogram does not indicate any etiology for his urinary incontinence and frequency and Gemtesa is not effective, we will schedule appointment with Dr. Matilde Sprang for further evaluation and management  3. Prostate cancer -recent PSA undetectable - followed by cancer center  Return for pending CT urogram results .  Zara Council, [facility] [address]  [address] [place], [place] [address] [phone]

## 2022-03-29 NOTE — Patient Instructions (Signed)
CT scheduling [phone]

## 2022-04-02 ENCOUNTER — Telehealth: Payer: Self-pay

## 2022-04-02 LAB — CULTURE, URINE COMPREHENSIVE

## 2022-04-02 MED ORDER — NITROFURANTOIN MONOHYD MACRO 100 MG PO CAPS: 100.0000 mg | ORAL_CAPSULE | Freq: Two times a day (BID) | ORAL | 0 refills | Status: AC

## 2022-04-02 NOTE — Telephone Encounter (Signed)
-----   Message from Nori Riis, PA-C sent at 04/02/2022 12:39 PM EDT ----- Please let Mr. Mciver know that his urine culture was positive and we need for him to start Macrobid 100 mg, twice daily for seven days.

## 2022-04-02 NOTE — Progress Notes (Unsigned)
Cardiology Office Note:    Date:  04/03/2022   ID:  Phillip [facility], DOB [birth year], MRN [id number]  PCP:  Phillip Ghent, MD  [facility] Cardiologist:  Phillip Sacramento, MD  [facility] Electrophysiologist:  None   Referring MD: Phillip Ghent, MD   Chief Complaint: post-cath testing  History of Present Illness:    Phillip Clements is a 71 y.o. male with a hx of CAD/CABG, s/p PCI to left main-LAD, PAD s/p left SFA angioplasty 2016, former smoker x40+ years, OSA on CPAP, diabetes presenting for follow-up.    He had CABG in September 2015 for severe 3V CAD. EF was 45%. He developed recurrent angina in November 2015. HE underwent nuclear stress test which was abnormal. He had inferolateral STE with exercise. Nuclear imaging showed evidence of inferior and anterior ischemia. Repeath cardiac cath showed occluded grafts, except for SVG to OM2, LIMA to LAD. EF was 55%. He underwent atherectomy and stenting of the left main and LAD. He is intolerance to statins.    Echo 2021 showed normal EF with normal diastolic function   He was seen 02/09/22 for chest pain. Imdur was started and Myoview was ordered. Coreg was held for low BP.   Myoview showed ischemia and prior MI, evidence of ischemia, overall intermediate risk study  Last seen 03/06/22 and reported DOE and he was set up for [facility]  LHC showed CAD with occluded grafts  including SVG to OM, SVG to RCA and LIMA to LAD.  The SVG to OM occlusion is new compared to most recent cardiac catheterization but the other 2 grafts were noted to be occluded.  Patent left main and LAD stent with minimal restenosis.  Moderate diffuse mid LAD disease between the 2 stented segments.  In addition, the native left circumflex seems to have moderate nonobstructive disease.  The RCA is chronically occluded with left-to-right collaterals. LVEF was mildly redcued with inferior wall HK and mildly elevated LVEDP. Recommendations for medical therapy. FFR based  revascularization of the LAD and left circumflex can be considered for refractory symptoms but this is limited by diffuse disease.  The patient's anginal symptoms seem to be reasonably controlled with medications.  Today, the cardiac cath was reviewed. The patient overall has been doing well. He is still seeing PCP for frequent urination. He still has generalized fatigue, which was the reason for the heart cath. No clear etiology for the fatigue. He denies chest pain but has some DOE. He feels the heat may be contributing. He reports numbness in the feet, which may be from diabetes. He also reports bilateral calf pain when he walks. No LLE, orthopnea or pnd. Cath site, right groin is stable.   Past Medical History:  Diagnosis Date   Cataract    bilateral eye in 2010   Coronary artery disease    a. CABG 04/2014: (LIMA->LAD, VG->DIAG, VG->OM2, VG->PDA)  b. 07/2014 early graft failure (VG->OM2 60, LIMA->LAD atretic, VG->PDA & VG->D1 100). s/p successful rotational atherectomy & DES of the LM & pLAD (3.0x24 Promus DES) & mLAD (2.25x24 Promus DES).   CVA (cerebral infarction)    Family history of prostate cancer    Hx of adenomatous colonic polyps 06/17/2017   Hyperlipidemia    a. Statin intolerant.   Hypertension 2000   Moderate mitral regurgitation    a. 02/2015 Echo: EF 55-60%, no rwma, mod MR, mod BAE, mildly dil RV w/ low nl RV fxn.   OSA on CPAP  Peripheral vascular disease ([place])    a. left SFA angioplasty in December 2016   Prostate cancer [facility])    Sleep apnea    wears CPAP nightly   Type II diabetes mellitus [facility])     Past Surgical History:  Procedure Laterality Date   ANTERIOR CERVICAL DECOMP/DISCECTOMY FUSION  08/25/2012   Procedure: ANTERIOR CERVICAL DECOMPRESSION/DISCECTOMY FUSION 2 LEVELS;  Surgeon: Hosie Spangle, MD;  Location: [place] NEURO ORS;  Service: Neurosurgery;  Laterality: N/A;  Cervical five-six Cervical six-seven anterior cervical decompression with fusion and plating  and bonegraft   BACK SURGERY     CARDIAC CATHETERIZATION  04/2014; 07/19/2014   CORONARY ARTERY BYPASS GRAFT N/A 04/26/2014   Procedure: CORONARY ARTERY BYPASS GRAFTING (CABG), on pump, times four, using left internal mammary artery, right greater saphenous vein harvested endoscopically.;  Surgeon: Ivin Poot, MD;  Location: [place];  Service: Open Heart Surgery;  Laterality: N/A;  LIMA to LAD, SVG to DIAGONAL, SVG to OM1, SVG to PDA) with EVH of the RIGHT THIGH and LOWER EXTREMITY SAPHENOUS VEIN   Cytoscopy prostatic stone o/w nml  06/21/08   Dr. Jacqlyn Larsen   ETT myoview  09/13/09   Low risk EF 49%   High intense focused ultrasound  07/20/06   By Dr. Jacqlyn Larsen   INTRAOPERATIVE TRANSESOPHAGEAL ECHOCARDIOGRAM N/A 04/26/2014   Procedure: INTRAOPERATIVE TRANSESOPHAGEAL ECHOCARDIOGRAM;  Surgeon: Ivin Poot, MD;  Location: [place];  Service: Open Heart Surgery;  Laterality: N/A;   LEFT HEART CATH AND CORONARY ANGIOGRAPHY Left 03/19/2022   Procedure: LEFT HEART CATH AND CORONARY ANGIOGRAPHY;  Surgeon: Wellington Hampshire, MD;  Location: [place] CV LAB;  Service: Cardiovascular;  Laterality: Left;   PERCUTANEOUS CORONARY ROTOBLATOR INTERVENTION (PCI-R) N/A 07/22/2014   Procedure: PERCUTANEOUS CORONARY ROTOBLATOR INTERVENTION (PCI-R);  Surgeon: Peter M Martinique, MD;  Location: [facility] CATH LAB;  Service: Cardiovascular;  Laterality: N/A;   PERIPHERAL VASCULAR CATHETERIZATION Left 07/26/2015   Procedure: Lower Extremity Angiography;  Surgeon: Katha Cabal, MD;  Location: [place] CV LAB;  Service: Cardiovascular;  Laterality: Left;   PERIPHERAL VASCULAR CATHETERIZATION  07/26/2015   Procedure: Lower Extremity Intervention;  Surgeon: Katha Cabal, MD;  Location: [place] CV LAB;  Service: Cardiovascular;;   PROSTATE BIOPSY  04/03/06 & 02/25/07    Current Medications: Current Meds  Medication Sig   aspirin EC 81 MG tablet Take 1 tablet (81 mg total) by mouth daily.   clopidogrel (PLAVIX) 75 MG  tablet TAKE 1 TABLET BY MOUTH ONCE DAILY   Continuous Blood Gluc Sensor (FREESTYLE LIBRE 2 SENSOR) MISC Use to check blood sugars   Dulaglutide (TRULICITY) 1.5 VW/0.9WJ SOPN Inject 1.5 mg into the skin once a week.   ezetimibe (ZETIA) 10 MG tablet Take 1 tablet (10 mg total) by mouth daily.   furosemide (LASIX) 20 MG tablet Take 1 tablet (20 mg total) by mouth as needed.   glucose blood (ONETOUCH ULTRA) test strip CHECK BLOOD SUGAR TWICE A DAY - DX E11.59   Insulin Pen Needle 32G X 4 MM MISC Use 1x a day   INVOKANA 300 MG TABS tablet TAKE 1 TABLET BY MOUTH ONCE A DAY WITH BREAKFAST   isosorbide mononitrate (IMDUR) 30 MG 24 hr tablet Take 0.5 tablets (15 mg total) by mouth daily.   losartan (COZAAR) 25 MG tablet TAKE 1 TABLET BY MOUTH ONCE A DAY   meclizine (ANTIVERT) 25 MG tablet Take 0.5-1 tablets (12.5-25 mg total) by mouth 2 (two) times daily as needed for  dizziness or nausea.   nitrofurantoin, macrocrystal-monohydrate, (MACROBID) 100 MG capsule Take 1 capsule (100 mg total) by mouth 2 (two) times daily for 7 days.   nitroGLYCERIN (NITROSTAT) 0.4 MG SL tablet Place 1 tablet (0.4 mg total) under the tongue as needed.   REPATHA SURECLICK 109 MG/ML SOAJ INJECT 1 PEN INTO THE SKIN EVERY 14 DAYS   sulfamethoxazole-trimethoprim (BACTRIM DS) 800-160 MG tablet Take 1 tablet by mouth 2 (two) times daily.   tamsulosin (FLOMAX) 0.4 MG CAPS capsule TAKE 1 CAPSULE BY MOUTH ONCE DAILY AFTERSUPPER   TRESIBA FLEXTOUCH 200 UNIT/ML FlexTouch Pen INJECT 100 UNITS INTO THE SKIN DAILY   Vibegron (GEMTESA) 75 MG TABS Take 75 mg by mouth daily.     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  Tobacco Use   Smoking status: Former    Packs/day: 1.00    Years: 36.00    Total pack years: 36.00    Types: Cigarettes    Quit date: 03/01/2014    Years since quitting: 8.0   Smokeless tobacco: Never  Vaping Use   Vaping Use: Never used  Substance and Sexual Activity   Alcohol use: Yes    Comment: 07/19/2014 "might have a drink a couple times/yr"   Drug use: No   Sexual activity: Not Currently  Other Topics Concern   Not on file  Social History Narrative   Lives with wife.; 2 daughters and 1 son.[employer] fan; semi-retd; Arboriculturist; Former Education officer, community, 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 In [place].    Social Determinants of Health   Financial Resource Strain: Low Risk  (03/02/2022)   Overall Financial Resource Strain (CARDIA)    Difficulty of Paying Living Expenses: Not hard at all  Food Insecurity: No Food Insecurity (03/02/2022)   Hunger Vital Sign    Worried About Running Out of Food in the Last Year: Never true    Ran Out of Food in the Last Year: Never true  Transportation Needs: No Transportation Needs (03/02/2022)   PRAPARE - Hydrologist (Medical): No    Lack of Transportation (Non-Medical): No  Physical Activity: Sufficiently Active (03/02/2022)   Exercise Vital Sign    Days of Exercise per Week: 7 days    Minutes of Exercise per Session: 90 min  Stress: No Stress Concern Present (03/02/2022)   [place]    Feeling of Stress : Not at all  Social Connections: Not on file     Family History: The patient's family history includes Coronary artery disease in his father and mother; Diabetes in his brother, father, and sister; Diabetes (age of onset: 32) in his mother; Heart attack in his brother, father, maternal grandfather, and mother; Heart disease in his brother, father,  and mother; Hypertension in his father and mother; Prostate cancer (age of onset: 60) in his paternal grandfather. There is no history of Breast cancer, Colon cancer, Depression, Alcohol abuse, or Stroke.  ROS:   Please see the history of present illness.     All  other systems reviewed and are negative.  EKGs/Labs/Other Studies Reviewed:    The following studies were reviewed today:  LHC 03/2022     Mid LM to Mid LAD lesion is 10% stenosed.   Mid LAD-1 lesion is 60% stenosed.   2nd Diag lesion is 100% stenosed.   Dist LAD lesion is 10% stenosed.   Mid LAD-2 lesion is 40% stenosed.   Ost Cx to Prox Cx lesion is 60% stenosed.   Origin to Prox Graft lesion is 100% stenosed.   Origin lesion is 100% stenosed.   Prox RCA to Mid RCA lesion is 100% stenosed.   SVG.   There is mild left ventricular systolic dysfunction.   LV end diastolic pressure is mildly elevated.   The left ventricular ejection fraction is 45-50% by visual estimate.   1.  Significant native three-vessel coronary artery disease with occluded grafts including SVG to OM, SVG to RCA and LIMA to LAD.  The SVG to OM occlusion is new compared to most recent cardiac catheterization but the other 2 grafts were noted to be occluded.  Patent left main and LAD stent with minimal restenosis.  Moderate diffuse mid LAD disease between the 2 stented segments.  In addition, the native left circumflex seems to have moderate nonobstructive disease.  The RCA is chronically occluded with left-to-right collaterals.   2.  Mildly reduced LV systolic function with inferior wall hypokinesis and mildly elevated left ventricular end-diastolic pressure.   Recommendations: Continue aggressive medical therapy. FFR based revascularization of the LAD and left circumflex can be considered for refractory symptoms but this is limited by diffuse disease.  The patient's anginal symptoms seem to be reasonably controlled with medications.  Coronary  Diagrams  Diagnostic Dominance: Right     Echo 03/2022 1. Left ventricular ejection fraction, by estimation, is 60 to 65%. The  left ventricle has normal function. The left ventricle has no regional  wall motion abnormalities. There is moderate asymmetric left ventricular  hypertrophy of the basal-septal  segment. Left ventricular diastolic parameters are consistent with Grade I  diastolic dysfunction (impaired relaxation).   2. Right ventricular systolic function is normal. The right ventricular  size is normal. There is normal pulmonary artery systolic pressure. The  estimated right ventricular systolic pressure is 02.7 mmHg.   3. Left atrial size was mildly dilated.   4. The mitral valve is normal in structure. Mild mitral valve  regurgitation. No evidence of mitral stenosis.   5. The aortic valve is normal in structure. Aortic valve regurgitation is  not visualized. Aortic valve sclerosis/calcification is present, without  any evidence of aortic stenosis.   6. The inferior vena cava is normal in size with greater than 50%  respiratory variability, suggesting right atrial pressure of 3 mmHg.   EKG:  EKG is ordered today.  The ekg ordered today demonstrates NSR 81bpm, prior inferior infarct, nonspecific ST/T wave changes, no changes  Recent Labs: 02/08/2022: Pro B Natriuretic peptide (BNP) 62.0; TSH 1.67 03/27/2022: ALT 29; BUN 22; Creatinine, Ser 0.98; Hemoglobin 13.7; Platelets 134; Potassium 4.1; Sodium 133  Recent Lipid Panel    Component Value Date/Time   CHOL 107 03/27/2022 1019   CHOL 97 (L) 09/24/2016 0855   TRIG 156 (H) 03/27/2022 1019   HDL 27 (L) 03/27/2022 1019   HDL 38 (L) 09/24/2016 0855   CHOLHDL 4.0 03/27/2022 1019   VLDL 31 03/27/2022 1019   LDLCALC 49 03/27/2022 1019   LDLCALC 37 09/24/2016 0855   LDLDIRECT  157.1 09/07/2013 0910    Physical Exam:    VS:  BP 110/60 (BP Location: Left Arm, Patient Position: Sitting, Cuff Size: Normal)   Pulse 81   Ht  '5\' 9"'$  (1.753 m)   Wt 249 lb 3.2 oz (113 kg)   SpO2 95%   BMI 36.80 kg/m     Wt Readings from Last 3 Encounters:  04/03/22 249 lb 3.2 oz (113 kg)  03/29/22 242 lb (109.8 kg)  03/27/22 246 lb 3.2 oz (111.7 kg)     GEN:  Well nourished, well developed in no acute distress HEENT: Normal NECK: No JVD; No carotid bruits LYMPHATICS: No lymphadenopathy CARDIAC: RRR, no murmurs, rubs, gallops RESPIRATORY:  Clear to auscultation without rales, wheezing or rhonchi  ABDOMEN: Soft, non-tender, non-distended MUSCULOSKELETAL:  No edema; No deformity  SKIN: Warm and dry NEUROLOGIC:  Alert and oriented x 3 PSYCHIATRIC:  Normal affect   ASSESSMENT:    1. Coronary artery disease involving native coronary artery of native heart without angina pectoris   2. Peripheral vascular disease ([place])   3. Claudication in peripheral vascular disease ([place])   4. Essential hypertension   5. Hyperlipidemia, mixed    PLAN:    In order of problems listed above:  CAD s/p CABG with subsequent PCI LHC showed significant 3V CAD with occluded grafts including SVG to OM1, SVG to RCA and LIMA to LAD. The SVG to OM occlusion is new compared to most recent cardiac catheterization but the other 2 grafts were noted to be occluded. Left main stent had minimal stenosis, moderate diffuse mid LAD disease between the 2 segments. The native left Cx seems to have moderate nonobstructive disease and the RCA is chronically occluded with L>R collaterals. Recommendations for medical therapy, however FFR based revascularization of the LAD and Left Cx can be considered for refractory symptoms. Most recent echo showed LVEF 60-65%. The patient denies chest pain, just has overall fatigue and some DOE. He is on Aspirin '81mg'$  daily, Plavix '75mg'$  daily, Zetia '10mg'$  daily, Imdur '15mg'$  daily, Losartan 12.'5mg'$  daily, and Repatha. Coreg stopped previously for low BP. Due to low normal BP I will defer any further medication changes at this time. Can  possibly consider Ranexa if symptoms worsen.   Claudication PAD s/p left SFA angioplasty 2016 He reports bilateral claudication. I will order ABIs and have him follow-up with Dr. Fletcher Anon. Continue Aspirin, Plavix, Zetia and Repatha.   HTN BP is good, has been low in the past. Continue current medications.   HLD He is intolerant to statins. LDL 49. Continue Repatha and Zetia.   Disposition: Follow up in 2-3 month(s) with MD     Signed, Cherron Blitzer Ninfa Meeker, PA-C  04/03/2022 10:00 AM     [facility]

## 2022-04-02 NOTE — Telephone Encounter (Signed)
Ok per DPR, [facility] notifying pt of results. Sent Abx Rx to pts pharmacy.

## 2022-04-03 ENCOUNTER — Ambulatory Visit
Admission: RE | Admit: 2022-04-03 | Discharge: 2022-04-03 | Disposition: A | Discharge status: Home / Self Care | Payer: Medicare Other | Source: Ambulatory Visit | Attending: Urology | Admitting: Urology

## 2022-04-03 ENCOUNTER — Ambulatory Visit (INDEPENDENT_AMBULATORY_CARE_PROVIDER_SITE_OTHER): Payer: Medicare Other | Admitting: Medical

## 2022-04-03 ENCOUNTER — Encounter: Payer: Self-pay | Admitting: Medical

## 2022-04-03 VITALS — BP 110/60 | HR 81 | Ht 69.0 in | Wt 249.2 lb

## 2022-04-03 DIAGNOSIS — R310 Gross hematuria: Secondary | ICD-10-CM | POA: Diagnosis not present

## 2022-04-03 DIAGNOSIS — E782 Mixed hyperlipidemia: Secondary | ICD-10-CM | POA: Diagnosis not present

## 2022-04-03 DIAGNOSIS — R161 Splenomegaly, not elsewhere classified: Secondary | ICD-10-CM | POA: Diagnosis not present

## 2022-04-03 DIAGNOSIS — C61 Malignant neoplasm of prostate: Secondary | ICD-10-CM | POA: Diagnosis not present

## 2022-04-03 DIAGNOSIS — I2510 Atherosclerotic heart disease of native coronary artery without angina pectoris: Secondary | ICD-10-CM | POA: Diagnosis not present

## 2022-04-03 DIAGNOSIS — I739 Peripheral vascular disease, unspecified: Secondary | ICD-10-CM | POA: Diagnosis not present

## 2022-04-03 DIAGNOSIS — N281 Cyst of kidney, acquired: Secondary | ICD-10-CM | POA: Diagnosis not present

## 2022-04-03 DIAGNOSIS — I10 Essential (primary) hypertension: Secondary | ICD-10-CM

## 2022-04-03 MED ORDER — IOHEXOL 300 MG/ML  SOLN
100.0000 mL | Freq: Once | INTRAMUSCULAR | Status: AC | PRN
  Administered 2022-04-03: 100 mL via INTRAVENOUS

## 2022-04-03 NOTE — Patient Instructions (Signed)
Medication Instructions:  NO CHANGES *If you need a refill on your cardiac medications before your next appointment, please call your pharmacy*   Lab Work: NONE If you have labs (blood work) drawn today and your tests are completely normal, you will receive your results only by: [place] (if you have MyChart) OR A paper copy in the mail If you have any lab test that is abnormal or we need to change your treatment, we will call you to review the results.   Testing/Procedures: Your physician has requested that you have an ankle brachial index (ABI). During this test an ultrasound and blood pressure cuff are used to evaluate the arteries that supply the arms and legs with blood. Allow thirty minutes for this exam. There are no restrictions or special instructions.   Your physician has requested that you have a lower or upper extremity arterial duplex. This test is an ultrasound of the arteries in the legs or arms. It looks at arterial blood flow in the legs and arms. Allow one hour for Lower and Upper Arterial scans. There are no restrictions or special instructions  BILATERAL LOWER   Follow-Up: At [facility], you and your health needs are our priority.  As part of our continuing mission to provide you with exceptional heart care, we have created designated Provider Care Teams.  These Care Teams include your primary Cardiologist (physician) and Advanced Practice Providers (APPs -  Physician Assistants and Nurse Practitioners) who all work together to provide you with the care you need, when you need it.  We recommend signing up for the patient portal called "MyChart".  Sign up information is provided on this After Visit Summary.  MyChart is used to connect with patients for Virtual Visits (Telemedicine).  Patients are able to view lab/test results, encounter notes, upcoming appointments, etc.  Non-urgent messages can be sent to your provider as well.   To learn more about what you can  do with MyChart, go to [email or web address].    Your next appointment:  NEXT AVAILABLE WITH DR Fletcher Anon AFTER LOWER EXTREMITY ULTRASOUND    The format for your next appointment:     Provider:    DR ARIDA    Other Instructions NONE  Important Information About Sugar

## 2022-04-09 ENCOUNTER — Other Ambulatory Visit (HOSPITAL_COMMUNITY): Payer: Self-pay

## 2022-04-09 ENCOUNTER — Telehealth: Payer: Self-pay | Admitting: Pharmacy Technician

## 2022-04-09 ENCOUNTER — Encounter: Payer: Self-pay | Admitting: Internal Medicine

## 2022-04-09 NOTE — Telephone Encounter (Signed)
Patient Advocate Encounter   Received notification from CoverMyMeds that prior authorization for [facility] 2 is due for renewal.   Key [id number]  This is under the pt's old ins plan.

## 2022-04-10 ENCOUNTER — Other Ambulatory Visit (HOSPITAL_COMMUNITY): Payer: Self-pay

## 2022-04-10 NOTE — Telephone Encounter (Signed)
Pt states no changes in insurance recently. But was advised PA is needed.

## 2022-04-11 ENCOUNTER — Other Ambulatory Visit: Payer: Self-pay | Admitting: Cardiovascular Disease

## 2022-04-11 NOTE — Telephone Encounter (Signed)
Pt notified via Mychart

## 2022-04-12 NOTE — Progress Notes (Signed)
Virtual Visit via Telephone Note  I connected with Phillip Clements on 04/12/22 at  8:00 AM EDT by telephone and verified that I am speaking with the correct person using two identifiers.  Location: Patient: Home Provider: Office   I discussed the limitations, risks, security and privacy concerns of performing an evaluation and management service by telephone and the availability of in person appointments. I also discussed with the patient that there may be a patient responsible charge related to this service. The patient expressed understanding and agreed to proceed.   History of Present Illness: Urological history  Prostate adenocarcinoma -PSA, 12/2021 - <0.01 -  02/2006 PSA returned elevated at 17.49. 04/03/2006; PSA 24.7; prostate biopsy Gleason 3+3 with 4/4 cores -  [place] [facility] [place] late [place]  -  persistent PSA elevation over period 2007-2010; nadir 2.1 09/16/06, peak 15.0 ng/mL  - 04/2006 bone scan and CT abd/pelvis without evidence of metastasis - 03/08/2007 CT abdomen/pelvis with no definitive metastatic disease noted  - 03/18/2007 prostascint scan with no evidence of disease but small pelvic lymph nodes noted  - 08/15/2009 prostascint scan with no evidence of local prostate cancer or distant metastases  -06/02/10 initiation of degarelix  -05/25/10 PET CT (Fluoride) revealed heterogenous areas of FDG-avidity in ribs, femurs, humeri, areas also in the metatarsals noted which may have been arthiritic in nature  m. 07/03/10 PSA 9.6 ng/mL  -  Started Lupron/Casodex - Axumin scan 09/2019 showed tracer avid soft tissue and prostate bed; no evidence of nodal or osseous metastasis -  completed salvage radiation 01/08/2020 - PVR, 02/2022 0 mL   2. High risk hematuria -former smoker -CTU pending -cysto, 03/2022 - Prostatic urethra open with hypervascularity, shaggy -mucosa posterior urethra without discrete mass - open bladder neck -urine cytology, 03/2022 - negative -no reports of gross  heme -UA negative for micro heme, 03/2022   Observations/Objective: He is with his wife, [name].   He is answering questions appropriately and does not sound distressed.  He did state he was taking both Gemtesa and Myrbetriq at the same time.  Patient denies any modifying or aggravating factors.  Patient denies any gross hematuria, dysuria or suprapubic/flank pain.  Patient denies any fevers, chills, nausea or vomiting.    CLINICAL DATA:  Prostate cancer.  Gross hematuria.   * Tracking Code: BO *   EXAM: CT ABDOMEN AND PELVIS WITHOUT AND WITH CONTRAST   TECHNIQUE: Multidetector CT imaging of the abdomen and pelvis was performed following the standard protocol before and following the bolus administration of intravenous contrast.   RADIATION DOSE REDUCTION: This exam was performed according to the departmental dose-optimization program which includes automated exposure control, adjustment of the mA and/or kV according to patient size and/or use of iterative reconstruction technique.   CONTRAST:  137m OMNIPAQUE IOHEXOL 300 MG/ML  SOLN   COMPARISON:  Multiple exams, including Pylarify PET-CT 05/17/2020   FINDINGS: Lower chest: Bilateral gynecomastia. Substantial atheromatous calcification in the coronary arteries. Prior CABG. Descending thoracic aortic atherosclerosis.   Hepatobiliary: 5 mm hypodense lesion in the right hepatic lobe on image 26 series 4, technically too small to characterize although statistically likely to be benign. In general, such lesions are not felt to warrant further workup unless there is a specific renal clinical concern.   Contracted gallbladder.   Pancreas: Unremarkable   Spleen: The spleen measures 17.5 by 6.4 by 13.5 cm (volume = 790 cm^3), compatible with splenomegaly. No focal splenic lesion.   Adrenals/Urinary Tract: Both adrenal  glands appear normal.   4.8 by 4.2 cm Bosniak category 1 cyst of the left kidney lower pole on image 60  series 4. Small Bosniak category 1 bilateral peripelvic cysts are present. No follow-up imaging of these benign renal cystic lesions is recommended.   JACR 2018 Feb; 264-273, Management of the Incidental RenalMass on CT, [employer] 2021; 814-848, Bosniak Classification of Cystic Renal Masses, Version 2019.   No urinary tract calculi, abnormal enhancement along the urothelium, or abnormal filling defect along the urothelium is identified.   Stomach/Bowel: Unremarkable   Vascular/Lymphatic: Atherosclerosis is present, including aortoiliac atherosclerotic disease. Right external iliac node 1.0 cm in short axis on image 88 series 4, formerly 0.6 cm on 05/17/2020. Left external iliac node 0.9 cm in short axis on image 89 series 4, previously 0.6 cm.   Reproductive: Fiducials along the posterior prostate margin. No prostatomegaly.   Other: No supplemental non-categorized findings.   Musculoskeletal: Lower thoracic spondylosis. Lower lumbar spondylosis and degenerative disc disease with resulting left foraminal impingement at L3-4, L4-5, and L5-S1; and right foraminal impingement at L3-4 and L4-5. No compelling findings of osseous metastatic disease.   IMPRESSION: 1. A specific cause for gross hematuria is not identified. 2. Splenomegaly, cause uncertain. 3. Borderline enlarged right external iliac lymph node at 1.0 cm in short axis (formerly 0.6 cm on 05/17/2020). 4. Other imaging findings of potential clinical significance: Bilateral gynecomastia. Aortic Atherosclerosis (ICD10-I70.0). Coronary and systemic atherosclerosis. Lower lumbar impingement.     Electronically Signed   By: Van Clines M.D.   On: 04/05/2022 10:41 I have independently reviewed the films.  See HPI.      Assessment and Plan: 1. Gross hematuria  - no findings on CT urogram to explain gross heme   -cysto hypervascular prostate  -no further reports of gross heme  2. Urinary  Frequency  -Patient was taking both the Gemtesa 75 mg daily and the Myrbetriq 50 mg daily and noticed that his frequency had diminished, but the incontinence still persisted  3. Incontinence  -uncontrolled diabetes is a likely contributor along with heart disease  4. Enlarged lymph node/splenomegaly  -Recommend to speak with Dr. Rogue Bussing regarding further follow-up and management   5. [place]   -They have not yet heard from the gastroenterology office regarding an appointment  -I will follow-up     Follow Up Instructions:  I will leave Gemtesa 75 mg samples upfront and he will take only the [place] for the next 4 weeks and contact me via MyChart regarding the effectiveness   I discussed the assessment and treatment plan with the patient. The patient was provided an opportunity to ask questions and all were answered. The patient agreed with the plan and demonstrated an understanding of the instructions.   The patient was advised to call back or seek an in-person evaluation if the symptoms worsen or if the condition fails to improve as anticipated.  I provided 20 minutes of non-face-to-face time during this encounter.   Harshan Kearley, PA-C

## 2022-04-12 NOTE — Telephone Encounter (Signed)
Pt scheduled for phone visit to go over CT results. Pt confirmed.

## 2022-04-13 ENCOUNTER — Ambulatory Visit (INDEPENDENT_AMBULATORY_CARE_PROVIDER_SITE_OTHER): Payer: Medicare Other | Admitting: Urology

## 2022-04-13 ENCOUNTER — Encounter: Payer: Self-pay | Admitting: Urology

## 2022-04-13 DIAGNOSIS — R142 Eructation: Secondary | ICD-10-CM | POA: Diagnosis not present

## 2022-04-13 DIAGNOSIS — C61 Malignant neoplasm of prostate: Secondary | ICD-10-CM

## 2022-04-13 DIAGNOSIS — R310 Gross hematuria: Secondary | ICD-10-CM

## 2022-04-13 DIAGNOSIS — R350 Frequency of micturition: Secondary | ICD-10-CM | POA: Diagnosis not present

## 2022-04-13 DIAGNOSIS — I2510 Atherosclerotic heart disease of native coronary artery without angina pectoris: Secondary | ICD-10-CM | POA: Diagnosis not present

## 2022-04-13 DIAGNOSIS — R32 Unspecified urinary incontinence: Secondary | ICD-10-CM | POA: Diagnosis not present

## 2022-04-18 ENCOUNTER — Encounter: Payer: Self-pay | Admitting: Internal Medicine

## 2022-04-19 ENCOUNTER — Encounter: Payer: Self-pay | Admitting: Internal Medicine

## 2022-04-19 ENCOUNTER — Other Ambulatory Visit (HOSPITAL_COMMUNITY): Payer: Self-pay

## 2022-04-19 ENCOUNTER — Ambulatory Visit
Admission: RE | Admit: 2022-04-19 | Discharge: 2022-04-19 | Disposition: A | Discharge status: Home / Self Care | Payer: Medicare Other | Source: Ambulatory Visit | Attending: Internal Medicine | Admitting: Internal Medicine

## 2022-04-19 ENCOUNTER — Telehealth: Payer: Self-pay

## 2022-04-19 ENCOUNTER — Ambulatory Visit (INDEPENDENT_AMBULATORY_CARE_PROVIDER_SITE_OTHER): Payer: Medicare Other | Admitting: Internal Medicine

## 2022-04-19 ENCOUNTER — Ambulatory Visit
Admission: RE | Admit: 2022-04-19 | Discharge: 2022-04-19 | Disposition: A | Discharge status: Home / Self Care | Payer: Medicare Other | Attending: Internal Medicine | Admitting: Internal Medicine

## 2022-04-19 DIAGNOSIS — R0609 Other forms of dyspnea: Secondary | ICD-10-CM | POA: Diagnosis not present

## 2022-04-19 DIAGNOSIS — I2510 Atherosclerotic heart disease of native coronary artery without angina pectoris: Secondary | ICD-10-CM | POA: Diagnosis not present

## 2022-04-19 DIAGNOSIS — R0602 Shortness of breath: Secondary | ICD-10-CM | POA: Diagnosis not present

## 2022-04-19 MED ORDER — FAMOTIDINE 20 MG PO TABS: ORAL_TABLET | ORAL | 11 refills | Status: AC

## 2022-04-19 MED ORDER — RABEPRAZOLE SODIUM 20 MG PO TBEC: 20.0000 mg | DELAYED_RELEASE_TABLET | Freq: Every day | ORAL | 2 refills | Status: AC

## 2022-04-19 NOTE — Telephone Encounter (Signed)
Additional Information sent per insurance request

## 2022-04-19 NOTE — Progress Notes (Signed)
Phillip Clements, male    DOB: 09-08-1950   MRN: [id number]   Brief patient profile:  70  yowm quit smoking 2015 very healthy at wt 280  referred to pulmonary clinic in [facility]  04/19/2022 by Dr Phillip Clements for doe x  1st of the year without much progressions since onset but says had been  working out in gym   [facility] 03/19/22   LVEDP  =  25  with diffuse CAD  History of Present Illness  04/19/2022  Pulmonary/ 1st office eval/ Phillip Clements / [employer]  Chief Complaint  Patient presents with   Consult  Dyspnea:  slt incline to workshop x 260f assoc with variable burning upper ant chest discomfort  / also happens lying down no better with pepcid  Cough:  x 5 years p supper worse > min mucoid Sleep: on  cpap on horizontal bed does fine SABA use: none   No obvious day to day or daytime pattern/variability or assoc excess/ purulent sputum or mucus plugs or hemoptysis or cp or chest tightness, subjective wheeze or overt sinus or hb symptoms.   Sleeping  without nocturnal  or early am exacerbation  of respiratory  c/o's or need for noct saba. Also denies any obvious fluctuation of symptoms with weather or environmental changes or other aggravating or alleviating factors except as outlined above   No unusual exposure hx or h/o childhood pna/ asthma or knowledge of premature birth.  Current Allergies, Complete Past Medical History, Past Surgical History, Family History, and Social History were reviewed in [employer].  ROS  The following are not active complaints unless bolded Hoarseness, sore throat, dysphagia, dental problems, itching, sneezing,  nasal congestion or discharge of excess mucus or purulent secretions, ear ache,   fever, chills, sweats, unintended wt loss or wt gain, classically pleuritic or exertional cp,  orthopnea pnd or arm/hand swelling  or leg swelling, presyncope, palpitations, abdominal pain, anorexia, nausea, vomiting, diarrhea  or change in bowel habits  or change in bladder habits, change in stools or change in urine, dysuria, hematuria,  rash, arthralgias, visual complaints, headache, numbness, weakness or ataxia or problems with walking or coordination,  change in mood or  memory.            Past Medical History:  Diagnosis Date   Cataract    bilateral eye in 2010   Coronary artery disease    a. CABG 04/2014: (LIMA->LAD, VG->DIAG, VG->OM2, VG->PDA)  b. 07/2014 early graft failure (VG->OM2 60, LIMA->LAD atretic, VG->PDA & VG->D1 100). s/p successful rotational atherectomy & DES of the LM & pLAD (3.0x24 Promus DES) & mLAD (2.25x24 Promus DES).   CVA (cerebral infarction)    Family history of prostate cancer    Hx of adenomatous colonic polyps 06/17/2017   Hyperlipidemia    a. Statin intolerant.   Hypertension 2000   Moderate mitral regurgitation    a. 02/2015 Echo: EF 55-60%, no rwma, mod MR, mod BAE, mildly dil RV w/ low nl RV fxn.   OSA on CPAP    [place]ipheral vascular disease ([place]    a. left SFA angioplasty in December 2016   Prostate cancer ([facility]    Sleep apnea    wears CPAP nightly   Type II diabetes mellitus ([place]     Outpatient Medications Prior to Visit  Medication Sig Dispense Refill   aspirin EC 81 MG tablet Take 1 tablet (81 mg total) by mouth daily. 90 tablet 3  clopidogrel (PLAVIX) 75 MG tablet TAKE 1 TABLET BY MOUTH ONCE DAILY 90 tablet 0   Continuous Blood Gluc Sensor (FREESTYLE LIBRE 2 SENSOR) MISC Use to check blood sugars 2 each 3   Dulaglutide (TRULICITY) 1.5 WU/9.8JX SOPN Inject 1.5 mg into the skin once a week. 2 mL 11   ezetimibe (ZETIA) 10 MG tablet Take 1 tablet (10 mg total) by mouth daily. 90 tablet 3   furosemide (LASIX) 20 MG tablet Take 1 tablet (20 mg total) by mouth as needed. 90 tablet 1   glucose blood (ONETOUCH ULTRA) test strip CHECK BLOOD SUGAR TWICE A DAY - DX E11.59 200 strip 3   Insulin Pen Needle 32G X 4 MM MISC Use 1x a day 100 each 3   INVOKANA 300 MG TABS tablet TAKE 1 TABLET BY MOUTH  ONCE A DAY WITH BREAKFAST 90 tablet 3   isosorbide mononitrate (IMDUR) 30 MG 24 hr tablet Take 0.5 tablets (15 mg total) by mouth daily. 45 tablet 3   losartan (COZAAR) 25 MG tablet TAKE 1 TABLET BY MOUTH ONCE A DAY 90 tablet 0   REPATHA SURECLICK 914 MG/ML SOAJ INJECT 1 PEN INTO THE SKIN EVERY 14 DAYS 2 mL 1   tamsulosin (FLOMAX) 0.4 MG CAPS capsule TAKE 1 CAPSULE BY MOUTH ONCE DAILY AFTERSUPPER 30 capsule 11   TRESIBA FLEXTOUCH 200 UNIT/ML FlexTouch Pen INJECT 100 UNITS INTO THE SKIN DAILY 18 mL 3   Vibegron (GEMTESA) 75 MG TABS Take 75 mg by mouth daily. 28 tablet 0   meclizine (ANTIVERT) 25 MG tablet Take 0.5-1 tablets (12.5-25 mg total) by mouth 2 (two) times daily as needed for dizziness or nausea. (Patient not taking: Reported on 04/19/2022) 30 tablet 0   MYRBETRIQ 50 MG TB24 tablet Take 50 mg by mouth daily. (Patient not taking: Reported on 04/19/2022)     nitroGLYCERIN (NITROSTAT) 0.4 MG SL tablet Place 1 tablet (0.4 mg total) under the tongue as needed. (Patient not taking: Reported on 04/19/2022) 25 tablet 2   No facility-administered medications prior to visit.     Objective:     BP 130/60 (BP Location: Right Arm, Patient Position: Sitting, Cuff Size: Large)   Pulse 78   Temp 97.6 F (36.4 C) (Oral)   Ht '5\' 9"'$  (1.753 m)   Wt 250 lb 6.4 oz (113.6 kg)   SpO2 97%   BMI 36.98 kg/m   SpO2: 97 %  RA  Amb mod obese wm nad   HEENT : Oropharynx  clear  Nasal turbinates nl    NECK :  without  apparent JVD/ palpable Nodes/TM    LUNGS: no acc muscle use,  Min barrel  contour chest wall with bilateral  slightly decreased bs s audible wheeze and  without cough on insp or exp maneuvers and min  Hyperresonant  to  percussion bilaterally    CV:  RRR  no s3 or murmur or increase in P2, and trace bilateral LE edema   ABD:  obese soft and nontender with pos end  insp Hoover's  in the supine position.  No bruits or organomegaly appreciated   MS:  Nl gait/ ext warm without deformities Or  obvious joint restrictions  calf tenderness, cyanosis or clubbing     SKIN: warm and dry with mild venous insuff changes both LEs  NEURO:  alert, approp, nl sensorium with  no motor or cerebellar deficits apparent.     CXR PA and Lateral:   04/19/2022 :  I personally reviewed images and impression is as follows:     Mild cm/ no acute findings    Assessment   DOE (dyspnea on exertion) Onset early 2023 - PFTs  04/22/14  FEV1  3.03 (83%) ratio .81  - LHC 03/19/22   LVEDP  =  25  with diffuse CAD - 04/19/2022   Walked on RA  x  3  lap(s) =  approx 525  ft  @ avg pace, stopped due to end of study with lowest 02 sats 94% and no sob    When respiratory symptoms begin or become refractory well after a patient reports complete smoking cessation,  Especially when this wasn't the case while they were smoking, a red flag is raised based on the work of Dr Kris Mouton which states:  if you quit smoking when your best day FEV1 is still well preserved it is highly unlikely you will progress to severe disease.  That is to say, once the smoking stops,  the symptoms should not suddenly erupt or markedly worsen.  If so, the differential diagnosis should include  obesity/deconditioning,  LPR/Reflux/Aspiration syndromes,  occult CHF, or  especially side effect of medications commonly used in this population.     Asssoc with cough and somewhat atypical burning cp ( despite presence of known CAD) could be related to gerd so rec max rx for gerd and continued rx by cards to keep LVEDP at rest lower if feasible   F/u here in 6 weeks.  Each maintenance medication was reviewed in detail including emphasizing most importantly the difference between maintenance and prns and under what circumstances the prns are to be triggered using an action plan format where appropriate.  Total time for H and P, chart review, counseling,  directly observing portions of ambulatory 02 saturation study/ and generating customized AVS unique  to this office visit / same day charting > 45 min new pt eval                    Christinia Gully, MD 04/19/2022

## 2022-04-19 NOTE — Telephone Encounter (Signed)
Patient Advocate Encounter   Received notification from [facility] that prior authorization is required for RABEprazole Sodium '20MG'$  dr tablets  Submitted: 04-19-2022 Key [id number]

## 2022-04-19 NOTE — Progress Notes (Signed)
I spoke to patient wife regarding concern for splenomegaly and adenopathy.  Discussed the broad differential-including lymphoma versus prostate cancer.  Patient is asymptomatic except for mild thrombocytopenia.   Discussed the work-up would include bone marrow biopsy.  Hold off given patient is asymptomatic.  Would recommend ultrasound of the abdomen-prior to next visit.   BC-please schedule.   Thanks,  GB

## 2022-04-19 NOTE — Patient Instructions (Addendum)
Pantoprazole (protonix) 40 mg   Take  30-60 min before first meal of the day and Pepcid (famotidine)  20 mg after supper until return to office - this is the best way to tell whether stomach acid is contributing to your problem.    GERD (REFLUX)  is an extremely common cause of respiratory symptoms just like yours , many times with no obvious heartburn at all.    It can be treated with medication, but also with lifestyle changes including elevation of the head of your bed (ideally with 6 -8inch blocks under the headboard of your bed),  Smoking cessation, avoidance of late meals, excessive alcohol, and avoid fatty foods, chocolate, peppermint, colas, red wine, and acidic juices such as orange juice.  NO MINT OR MENTHOL PRODUCTS SO NO COUGH DROPS  USE SUGARLESS CANDY INSTEAD (Jolley ranchers or Stover's or Life Savers) or even ice chips will also do - the key is to swallow to prevent all throat clearing. NO OIL BASED VITAMINS - use powdered substitutes.  Avoid fish oil when coughing.   Make sure you check your oxygen saturation at your highest level of activity to be sure it stays over 90% and keep track of it at least once a week, more often if breathing getting worse, and let me know if losing ground.    Please remember to go to the  x-ray department  for your tests - we will call you with the results when they are available    Please schedule a follow up office visit in 6 weeks, call sooner if needed

## 2022-04-20 ENCOUNTER — Encounter: Payer: Self-pay | Admitting: Internal Medicine

## 2022-04-20 ENCOUNTER — Other Ambulatory Visit: Payer: Self-pay | Admitting: Internal Medicine

## 2022-04-20 DIAGNOSIS — R161 Splenomegaly, not elsewhere classified: Secondary | ICD-10-CM | POA: Insufficient documentation

## 2022-04-20 NOTE — Assessment & Plan Note (Signed)
Onset early 2023 - PFTs  04/22/14  FEV1  3.03 (83%) ratio .81  - LHC 03/19/22   LVEDP  =  25  with diffuse CAD - 04/19/2022   Walked on RA  x  3  lap(s) =  approx 525  ft  @ avg pace, stopped due to end of study with lowest 02 sats 94% and no sob    When respiratory symptoms begin or become refractory well after a patient reports complete smoking cessation,  Especially when this wasn't the case while they were smoking, a red flag is raised based on the work of Dr Kris Mouton which states:  if you quit smoking when your best day FEV1 is still well preserved it is highly unlikely you will progress to severe disease.  That is to say, once the smoking stops,  the symptoms should not suddenly erupt or markedly worsen.  If so, the differential diagnosis should include  obesity/deconditioning,  LPR/Reflux/Aspiration syndromes,  occult CHF, or  especially side effect of medications commonly used in this population.     Asssoc with cough and somewhat atypical burning cp ( despite presence of known CAD) could be related to gerd so rec max rx for gerd and continued rx by cards to keep LVEDP at rest lower if feasible   F/u here in 6 weeks.  Each maintenance medication was reviewed in detail including emphasizing most importantly the difference between maintenance and prns and under what circumstances the prns are to be triggered using an action plan format where appropriate.  Total time for H and P, chart review, counseling,  directly observing portions of ambulatory 02 saturation study/ and generating customized AVS unique to this office visit / same day charting > 45 min new pt eval

## 2022-04-24 ENCOUNTER — Other Ambulatory Visit (HOSPITAL_COMMUNITY): Payer: Self-pay

## 2022-04-24 NOTE — Telephone Encounter (Signed)
Patient Advocate Encounter  Received a fax from [facility] regarding Prior Authorization for Rabeprazole 32m tablets.   Authorization has been DENIED due to criteria not met.   Determination letter attached to patient chart.   Routing to appeal team.

## 2022-04-26 ENCOUNTER — Other Ambulatory Visit (HOSPITAL_COMMUNITY): Payer: Self-pay

## 2022-04-26 NOTE — Telephone Encounter (Signed)
Patient Advocate Encounter  Received a fax from [facility] regarding Prior Authorization Appeal for Rabeprazole '20mg'$ .   Appeal DENIED.  Pt needs try/fail of esomeprazole and/or omeprazole

## 2022-04-30 NOTE — Telephone Encounter (Signed)
Ask pt to consider purchasing the generic product at [place] for 20.61 as per [facility] - this is just intended as a short term trial and if it doesn't helps after a minimum of a month he can stop it.

## 2022-05-03 ENCOUNTER — Telehealth: Payer: Self-pay

## 2022-05-03 NOTE — Telephone Encounter (Signed)
Inbound fax from DME supplier requesting form be completed and faxed with clinical notes. DME supplies ordered via [employer] through online portal.  

## 2022-05-08 ENCOUNTER — Ambulatory Visit: Payer: Medicare Other

## 2022-05-08 ENCOUNTER — Telehealth: Payer: Self-pay | Admitting: Family Medicine

## 2022-05-08 NOTE — Telephone Encounter (Signed)
Dee from [facility] called and stated need a death certificate signed, Case [id number]. Call back number [phone].

## 2022-05-08 NOTE — Telephone Encounter (Signed)
Please check with the funeral home to see about any details they have.  I didn't get any previous notice about this patient passing.  Please let me know.  Thanks.

## 2022-05-09 NOTE — Telephone Encounter (Signed)
Called and spoke with funeral home; they do not know cause of death other then patient was found at home.

## 2022-05-09 NOTE — Telephone Encounter (Signed)
I called and left a message on both numbers for the patient's spouse.  I do not have any extra details and I would like to talk to her (mainly to give condolences) before I fill out the death certificate.  Please talk to me about this tomorrow morning.  I asked her to please call us at the clinic if she got a chance.

## 2022-05-10 NOTE — Telephone Encounter (Signed)
Called and LMOVM.

## 2022-05-11 NOTE — Telephone Encounter (Signed)
Chart has been updated.

## 2022-05-11 NOTE — Telephone Encounter (Signed)
Death certificate signed.  Please close the chart.  I was always glad to see this gentleman in clinic and he will be missed.

## 2022-05-11 NOTE — Telephone Encounter (Signed)
His wife called me back.  I thanked her for taking the call.  Condolences offered.  I talked to her about having filled out the death certificate.  I told her I was always glad to see this gentleman in clinic.  She thanked me for the call.

## 2022-05-13 DEATH — deceased

## 2022-05-15 ENCOUNTER — Ambulatory Visit: Payer: Medicare Other | Admitting: Cardiovascular Disease

## 2022-05-31 ENCOUNTER — Ambulatory Visit: Payer: Medicare Other | Admitting: Internal Medicine

## 2022-06-11 ENCOUNTER — Ambulatory Visit: Payer: Medicare Other | Admitting: Internal Medicine

## 2022-06-25 ENCOUNTER — Other Ambulatory Visit: Payer: Medicare Other

## 2022-06-27 ENCOUNTER — Ambulatory Visit: Payer: Medicare Other | Admitting: Internal Medicine

## 2022-06-27 ENCOUNTER — Other Ambulatory Visit: Payer: Medicare Other

## 2022-11-29 ENCOUNTER — Other Ambulatory Visit: Payer: Medicare Other

## 2022-12-06 ENCOUNTER — Ambulatory Visit: Payer: Medicare Other | Admitting: Dermatology

## 2022-12-06 ENCOUNTER — Ambulatory Visit: Payer: Medicare Other | Admitting: Radiation Oncology
